# Patient Record
Sex: Female | Born: 1940 | Race: Black or African American | Hispanic: No | Marital: Married | Smoking: Never smoker
Health system: Southern US, Community
[De-identification: ages and names within clinical notes are randomized; demographics above are authoritative.]

## PROBLEM LIST (undated history)

## (undated) DIAGNOSIS — C7951 Secondary malignant neoplasm of bone: Secondary | ICD-10-CM

## (undated) DIAGNOSIS — C541 Malignant neoplasm of endometrium: Secondary | ICD-10-CM

## (undated) DIAGNOSIS — M87052 Idiopathic aseptic necrosis of left femur: Secondary | ICD-10-CM

## (undated) DIAGNOSIS — C50919 Malignant neoplasm of unspecified site of unspecified female breast: Secondary | ICD-10-CM

## (undated) DIAGNOSIS — I10 Essential (primary) hypertension: Secondary | ICD-10-CM

## (undated) DIAGNOSIS — Z923 Personal history of irradiation: Secondary | ICD-10-CM

## (undated) DIAGNOSIS — M87051 Idiopathic aseptic necrosis of right femur: Secondary | ICD-10-CM

## (undated) DIAGNOSIS — E559 Vitamin D deficiency, unspecified: Secondary | ICD-10-CM

## (undated) DIAGNOSIS — Z809 Family history of malignant neoplasm, unspecified: Secondary | ICD-10-CM

## (undated) DIAGNOSIS — E119 Type 2 diabetes mellitus without complications: Secondary | ICD-10-CM

## (undated) DIAGNOSIS — F32A Depression, unspecified: Secondary | ICD-10-CM

## (undated) DIAGNOSIS — F329 Major depressive disorder, single episode, unspecified: Secondary | ICD-10-CM

## (undated) DIAGNOSIS — D509 Iron deficiency anemia, unspecified: Secondary | ICD-10-CM

## (undated) DIAGNOSIS — E785 Hyperlipidemia, unspecified: Secondary | ICD-10-CM

## (undated) HISTORY — DX: Essential (primary) hypertension: I10

## (undated) HISTORY — DX: Major depressive disorder, single episode, unspecified: F32.9

## (undated) HISTORY — DX: Personal history of irradiation: Z92.3

## (undated) HISTORY — DX: Family history of malignant neoplasm, unspecified: Z80.9

## (undated) HISTORY — DX: Vitamin D deficiency, unspecified: E55.9

## (undated) HISTORY — DX: Hyperlipidemia, unspecified: E78.5

## (undated) HISTORY — DX: Iron deficiency anemia, unspecified: D50.9

## (undated) HISTORY — DX: Idiopathic aseptic necrosis of left femur: M87.052

## (undated) HISTORY — DX: Idiopathic aseptic necrosis of right femur: M87.051

## (undated) HISTORY — DX: Depression, unspecified: F32.A

---

## 1971-11-24 HISTORY — PX: OTHER SURGICAL HISTORY: SHX169

## 1998-02-11 ENCOUNTER — Ambulatory Visit (HOSPITAL_COMMUNITY): Admission: RE | Admit: 1998-02-11 | Discharge: 1998-02-11 | Payer: Self-pay | Admitting: *Deleted

## 1999-02-03 ENCOUNTER — Other Ambulatory Visit: Admission: RE | Admit: 1999-02-03 | Discharge: 1999-02-03 | Payer: Self-pay | Admitting: *Deleted

## 1999-05-20 ENCOUNTER — Ambulatory Visit (HOSPITAL_COMMUNITY): Admission: RE | Admit: 1999-05-20 | Discharge: 1999-05-20 | Payer: Self-pay | Admitting: *Deleted

## 2000-05-19 ENCOUNTER — Other Ambulatory Visit: Admission: RE | Admit: 2000-05-19 | Discharge: 2000-05-19 | Payer: Self-pay | Admitting: *Deleted

## 2001-06-20 ENCOUNTER — Other Ambulatory Visit: Admission: RE | Admit: 2001-06-20 | Discharge: 2001-07-07 | Payer: Self-pay

## 2002-01-26 ENCOUNTER — Other Ambulatory Visit: Admission: RE | Admit: 2002-01-26 | Discharge: 2002-01-26 | Payer: Self-pay

## 2002-02-03 ENCOUNTER — Encounter (INDEPENDENT_AMBULATORY_CARE_PROVIDER_SITE_OTHER): Payer: Self-pay | Admitting: *Deleted

## 2002-02-03 ENCOUNTER — Ambulatory Visit (HOSPITAL_COMMUNITY): Admission: RE | Admit: 2002-02-03 | Discharge: 2002-02-03 | Payer: Self-pay | Admitting: Obstetrics & Gynecology

## 2002-02-22 ENCOUNTER — Other Ambulatory Visit: Admission: RE | Admit: 2002-02-22 | Discharge: 2002-02-22 | Payer: Self-pay | Admitting: Otolaryngology

## 2002-08-09 ENCOUNTER — Ambulatory Visit (HOSPITAL_COMMUNITY): Admission: RE | Admit: 2002-08-09 | Discharge: 2002-08-09 | Payer: Self-pay | Admitting: Internal Medicine

## 2002-08-09 ENCOUNTER — Encounter: Payer: Self-pay | Admitting: Internal Medicine

## 2002-09-29 ENCOUNTER — Ambulatory Visit (HOSPITAL_COMMUNITY): Admission: RE | Admit: 2002-09-29 | Discharge: 2002-09-29 | Payer: Self-pay | Admitting: Gastroenterology

## 2003-08-06 ENCOUNTER — Other Ambulatory Visit: Admission: RE | Admit: 2003-08-06 | Discharge: 2003-08-06 | Payer: Self-pay | Admitting: Internal Medicine

## 2004-08-12 ENCOUNTER — Ambulatory Visit (HOSPITAL_COMMUNITY): Admission: RE | Admit: 2004-08-12 | Discharge: 2004-08-12 | Payer: Self-pay | Admitting: Internal Medicine

## 2004-11-23 DIAGNOSIS — C50919 Malignant neoplasm of unspecified site of unspecified female breast: Secondary | ICD-10-CM

## 2004-11-23 HISTORY — DX: Malignant neoplasm of unspecified site of unspecified female breast: C50.919

## 2004-11-23 HISTORY — PX: BREAST LUMPECTOMY: SHX2

## 2004-11-23 HISTORY — PX: BREAST SURGERY: SHX581

## 2005-08-11 ENCOUNTER — Other Ambulatory Visit: Admission: RE | Admit: 2005-08-11 | Discharge: 2005-08-11 | Payer: Self-pay | Admitting: Internal Medicine

## 2005-08-13 ENCOUNTER — Ambulatory Visit (HOSPITAL_COMMUNITY): Admission: RE | Admit: 2005-08-13 | Discharge: 2005-08-13 | Payer: Self-pay | Admitting: Internal Medicine

## 2005-08-25 ENCOUNTER — Encounter: Admission: RE | Admit: 2005-08-25 | Discharge: 2005-08-25 | Payer: Self-pay | Admitting: Internal Medicine

## 2005-08-25 ENCOUNTER — Encounter (INDEPENDENT_AMBULATORY_CARE_PROVIDER_SITE_OTHER): Payer: Self-pay | Admitting: *Deleted

## 2005-08-25 ENCOUNTER — Encounter (INDEPENDENT_AMBULATORY_CARE_PROVIDER_SITE_OTHER): Payer: Self-pay | Admitting: Diagnostic Radiology

## 2005-09-03 ENCOUNTER — Encounter: Admission: RE | Admit: 2005-09-03 | Discharge: 2005-09-03 | Payer: Self-pay | Admitting: Surgery

## 2005-09-09 ENCOUNTER — Encounter: Admission: RE | Admit: 2005-09-09 | Discharge: 2005-09-09 | Payer: Self-pay | Admitting: Surgery

## 2005-09-14 ENCOUNTER — Encounter: Admission: RE | Admit: 2005-09-14 | Discharge: 2005-09-14 | Payer: Self-pay | Admitting: Surgery

## 2005-09-14 ENCOUNTER — Ambulatory Visit (HOSPITAL_COMMUNITY): Admission: RE | Admit: 2005-09-14 | Discharge: 2005-09-14 | Payer: Self-pay | Admitting: Surgery

## 2005-09-14 ENCOUNTER — Encounter (INDEPENDENT_AMBULATORY_CARE_PROVIDER_SITE_OTHER): Payer: Self-pay | Admitting: *Deleted

## 2005-09-14 ENCOUNTER — Ambulatory Visit (HOSPITAL_BASED_OUTPATIENT_CLINIC_OR_DEPARTMENT_OTHER): Admission: RE | Admit: 2005-09-14 | Discharge: 2005-09-14 | Payer: Self-pay | Admitting: Surgery

## 2005-10-01 ENCOUNTER — Ambulatory Visit: Payer: Self-pay | Admitting: Oncology

## 2005-10-21 ENCOUNTER — Ambulatory Visit (HOSPITAL_COMMUNITY): Admission: RE | Admit: 2005-10-21 | Discharge: 2005-10-21 | Payer: Self-pay | Admitting: Oncology

## 2005-11-30 ENCOUNTER — Ambulatory Visit: Admission: RE | Admit: 2005-11-30 | Discharge: 2006-02-03 | Payer: Self-pay | Admitting: Radiation Oncology

## 2006-01-05 ENCOUNTER — Ambulatory Visit: Payer: Self-pay | Admitting: Oncology

## 2006-03-03 ENCOUNTER — Ambulatory Visit: Payer: Self-pay | Admitting: Oncology

## 2006-03-10 ENCOUNTER — Encounter: Admission: RE | Admit: 2006-03-10 | Discharge: 2006-03-10 | Payer: Self-pay | Admitting: Oncology

## 2006-08-16 ENCOUNTER — Ambulatory Visit: Payer: Self-pay | Admitting: Oncology

## 2006-08-16 ENCOUNTER — Ambulatory Visit (HOSPITAL_COMMUNITY): Admission: RE | Admit: 2006-08-16 | Discharge: 2006-08-16 | Payer: Self-pay | Admitting: Internal Medicine

## 2006-08-18 LAB — CBC WITH DIFFERENTIAL/PLATELET
BASO%: 0.5 % (ref 0.0–2.0)
EOS%: 1.7 % (ref 0.0–7.0)
Eosinophils Absolute: 0.1 10*3/uL (ref 0.0–0.5)
MCH: 27.4 pg (ref 26.0–34.0)
MCHC: 34.1 g/dL (ref 32.0–36.0)
MCV: 80.3 fL — ABNORMAL LOW (ref 81.0–101.0)
MONO%: 10.7 % (ref 0.0–13.0)
NEUT#: 2.4 10*3/uL (ref 1.5–6.5)
RBC: 5.03 10*6/uL (ref 3.70–5.32)
RDW: 13.6 % (ref 11.3–14.5)

## 2006-08-18 LAB — COMPREHENSIVE METABOLIC PANEL
ALT: 24 U/L (ref 0–40)
AST: 29 U/L (ref 0–37)
CO2: 28 mEq/L (ref 19–32)
Chloride: 101 mEq/L (ref 96–112)
Sodium: 138 mEq/L (ref 135–145)
Total Bilirubin: 0.4 mg/dL (ref 0.3–1.2)
Total Protein: 7.2 g/dL (ref 6.0–8.3)

## 2006-08-18 LAB — LACTATE DEHYDROGENASE: LDH: 119 U/L (ref 94–250)

## 2006-08-18 LAB — CANCER ANTIGEN 27.29: CA 27.29: 15 U/mL (ref 0–39)

## 2006-09-16 ENCOUNTER — Encounter: Admission: RE | Admit: 2006-09-16 | Discharge: 2006-09-16 | Payer: Self-pay | Admitting: Internal Medicine

## 2007-02-21 ENCOUNTER — Ambulatory Visit: Payer: Self-pay | Admitting: Oncology

## 2007-02-23 LAB — CBC WITH DIFFERENTIAL/PLATELET
BASO%: 0.9 % (ref 0.0–2.0)
EOS%: 3.4 % (ref 0.0–7.0)
HCT: 38.8 % (ref 34.8–46.6)
LYMPH%: 32.8 % (ref 14.0–48.0)
MCH: 27.9 pg (ref 26.0–34.0)
MCHC: 34.9 g/dL (ref 32.0–36.0)
MCV: 80 fL — ABNORMAL LOW (ref 81.0–101.0)
MONO%: 8.6 % (ref 0.0–13.0)
NEUT%: 54.3 % (ref 39.6–76.8)
lymph#: 1.4 10*3/uL (ref 0.9–3.3)

## 2007-02-23 LAB — COMPREHENSIVE METABOLIC PANEL
ALT: 23 U/L (ref 0–35)
AST: 23 U/L (ref 0–37)
Alkaline Phosphatase: 64 U/L (ref 39–117)
BUN: 12 mg/dL (ref 6–23)
Chloride: 98 mEq/L (ref 96–112)
Creatinine, Ser: 0.88 mg/dL (ref 0.40–1.20)
Total Bilirubin: 0.3 mg/dL (ref 0.3–1.2)

## 2007-08-31 ENCOUNTER — Ambulatory Visit: Payer: Self-pay | Admitting: Oncology

## 2007-09-02 LAB — COMPREHENSIVE METABOLIC PANEL
ALT: 31 U/L (ref 0–35)
Alkaline Phosphatase: 54 U/L (ref 39–117)
Sodium: 139 mEq/L (ref 135–145)
Total Bilirubin: 0.4 mg/dL (ref 0.3–1.2)
Total Protein: 7.1 g/dL (ref 6.0–8.3)

## 2007-09-02 LAB — CBC WITH DIFFERENTIAL/PLATELET
EOS%: 1.5 % (ref 0.0–7.0)
MCH: 28.3 pg (ref 26.0–34.0)
MCHC: 34.3 g/dL (ref 32.0–36.0)
MCV: 82.4 fL (ref 81.0–101.0)
MONO%: 8.3 % (ref 0.0–13.0)
RBC: 4.57 10*6/uL (ref 3.70–5.32)
RDW: 10.9 % — ABNORMAL LOW (ref 11.3–14.5)

## 2007-09-02 LAB — CANCER ANTIGEN 27.29: CA 27.29: 11 U/mL (ref 0–39)

## 2007-09-14 ENCOUNTER — Encounter (INDEPENDENT_AMBULATORY_CARE_PROVIDER_SITE_OTHER): Payer: Self-pay | Admitting: Interventional Radiology

## 2007-09-14 ENCOUNTER — Encounter: Admission: RE | Admit: 2007-09-14 | Discharge: 2007-09-14 | Payer: Self-pay | Admitting: Endocrinology

## 2007-09-14 ENCOUNTER — Other Ambulatory Visit: Admission: RE | Admit: 2007-09-14 | Discharge: 2007-09-14 | Payer: Self-pay | Admitting: Interventional Radiology

## 2007-09-19 ENCOUNTER — Encounter: Admission: RE | Admit: 2007-09-19 | Discharge: 2007-09-19 | Payer: Self-pay | Admitting: Internal Medicine

## 2008-01-02 ENCOUNTER — Ambulatory Visit: Payer: Self-pay | Admitting: Oncology

## 2008-01-04 LAB — CBC WITH DIFFERENTIAL/PLATELET
BASO%: 0.2 % (ref 0.0–2.0)
Basophils Absolute: 0 10*3/uL (ref 0.0–0.1)
EOS%: 1.4 % (ref 0.0–7.0)
HGB: 13.7 g/dL (ref 11.6–15.9)
MCH: 26.5 pg (ref 26.0–34.0)
MCHC: 33 g/dL (ref 32.0–36.0)
RBC: 5.16 10*6/uL (ref 3.70–5.32)
RDW: 13.9 % (ref 11.3–14.5)
lymph#: 1.6 10*3/uL (ref 0.9–3.3)

## 2008-01-05 LAB — CANCER ANTIGEN 27.29: CA 27.29: 19 U/mL (ref 0–39)

## 2008-01-05 LAB — VITAMIN D 25 HYDROXY (VIT D DEFICIENCY, FRACTURES): Vit D, 25-Hydroxy: 46 ng/mL (ref 30–89)

## 2008-01-05 LAB — COMPREHENSIVE METABOLIC PANEL
ALT: 18 U/L (ref 0–35)
AST: 24 U/L (ref 0–37)
Albumin: 4.4 g/dL (ref 3.5–5.2)
Calcium: 9.8 mg/dL (ref 8.4–10.5)
Chloride: 100 mEq/L (ref 96–112)
Potassium: 3.4 mEq/L — ABNORMAL LOW (ref 3.5–5.3)
Sodium: 139 mEq/L (ref 135–145)
Total Protein: 7.3 g/dL (ref 6.0–8.3)

## 2008-03-12 ENCOUNTER — Encounter: Admission: RE | Admit: 2008-03-12 | Discharge: 2008-03-12 | Payer: Self-pay | Admitting: Oncology

## 2008-03-26 ENCOUNTER — Ambulatory Visit: Payer: Self-pay | Admitting: Oncology

## 2008-08-24 ENCOUNTER — Ambulatory Visit: Payer: Self-pay | Admitting: Oncology

## 2008-08-28 LAB — CBC WITH DIFFERENTIAL/PLATELET
Basophils Absolute: 0 10*3/uL (ref 0.0–0.1)
Eosinophils Absolute: 0.1 10*3/uL (ref 0.0–0.5)
HCT: 39.7 % (ref 34.8–46.6)
HGB: 13.6 g/dL (ref 11.6–15.9)
MCH: 27.2 pg (ref 26.0–34.0)
NEUT#: 2.7 10*3/uL (ref 1.5–6.5)
NEUT%: 61.3 % (ref 39.6–76.8)
RDW: 13.6 % (ref 11.3–14.5)
lymph#: 1.2 10*3/uL (ref 0.9–3.3)

## 2008-08-29 LAB — COMPREHENSIVE METABOLIC PANEL
AST: 19 U/L (ref 0–37)
Albumin: 4.3 g/dL (ref 3.5–5.2)
BUN: 12 mg/dL (ref 6–23)
CO2: 30 mEq/L (ref 19–32)
Calcium: 10.3 mg/dL (ref 8.4–10.5)
Chloride: 99 mEq/L (ref 96–112)
Creatinine, Ser: 0.73 mg/dL (ref 0.40–1.20)
Glucose, Bld: 120 mg/dL — ABNORMAL HIGH (ref 70–99)
Potassium: 3.5 mEq/L (ref 3.5–5.3)

## 2008-08-29 LAB — LACTATE DEHYDROGENASE: LDH: 122 U/L (ref 94–250)

## 2008-08-29 LAB — CANCER ANTIGEN 27.29: CA 27.29: 21 U/mL (ref 0–39)

## 2008-08-29 LAB — VITAMIN D 25 HYDROXY (VIT D DEFICIENCY, FRACTURES): Vit D, 25-Hydroxy: 46 ng/mL (ref 30–89)

## 2008-09-24 ENCOUNTER — Encounter: Admission: RE | Admit: 2008-09-24 | Discharge: 2008-09-24 | Payer: Self-pay | Admitting: Internal Medicine

## 2008-10-01 ENCOUNTER — Other Ambulatory Visit: Admission: RE | Admit: 2008-10-01 | Discharge: 2008-10-01 | Payer: Self-pay | Admitting: Internal Medicine

## 2009-02-21 ENCOUNTER — Ambulatory Visit: Payer: Self-pay | Admitting: Oncology

## 2009-02-26 LAB — CBC WITH DIFFERENTIAL/PLATELET
Basophils Absolute: 0 10*3/uL (ref 0.0–0.1)
Eosinophils Absolute: 0.1 10*3/uL (ref 0.0–0.5)
HCT: 36.9 % (ref 34.8–46.6)
HGB: 12.7 g/dL (ref 11.6–15.9)
MCH: 26.8 pg (ref 25.1–34.0)
MONO#: 0.3 10*3/uL (ref 0.1–0.9)
NEUT#: 2 10*3/uL (ref 1.5–6.5)
NEUT%: 47.4 % (ref 38.4–76.8)
WBC: 4.1 10*3/uL (ref 3.9–10.3)
lymph#: 1.8 10*3/uL (ref 0.9–3.3)

## 2009-02-27 LAB — COMPREHENSIVE METABOLIC PANEL
AST: 18 U/L (ref 0–37)
Albumin: 4 g/dL (ref 3.5–5.2)
Alkaline Phosphatase: 51 U/L (ref 39–117)
BUN: 11 mg/dL (ref 6–23)
Glucose, Bld: 133 mg/dL — ABNORMAL HIGH (ref 70–99)
Potassium: 3.7 mEq/L (ref 3.5–5.3)
Sodium: 140 mEq/L (ref 135–145)
Total Bilirubin: 0.4 mg/dL (ref 0.3–1.2)
Total Protein: 6.8 g/dL (ref 6.0–8.3)

## 2009-02-27 LAB — VITAMIN D 25 HYDROXY (VIT D DEFICIENCY, FRACTURES): Vit D, 25-Hydroxy: 52 ng/mL (ref 30–89)

## 2009-02-27 LAB — CANCER ANTIGEN 27.29: CA 27.29: 19 U/mL (ref 0–39)

## 2009-09-25 ENCOUNTER — Encounter: Admission: RE | Admit: 2009-09-25 | Discharge: 2009-09-25 | Payer: Self-pay | Admitting: Oncology

## 2010-03-20 ENCOUNTER — Ambulatory Visit: Payer: Self-pay | Admitting: Oncology

## 2010-03-21 LAB — COMPREHENSIVE METABOLIC PANEL
ALT: 23 U/L (ref 0–35)
AST: 25 U/L (ref 0–37)
Albumin: 3.9 g/dL (ref 3.5–5.2)
Alkaline Phosphatase: 54 U/L (ref 39–117)
BUN: 11 mg/dL (ref 6–23)
CO2: 30 mEq/L (ref 19–32)
Calcium: 9.6 mg/dL (ref 8.4–10.5)
Chloride: 99 mEq/L (ref 96–112)
Creatinine, Ser: 0.83 mg/dL (ref 0.40–1.20)
Glucose, Bld: 116 mg/dL — ABNORMAL HIGH (ref 70–99)
Potassium: 3.4 mEq/L — ABNORMAL LOW (ref 3.5–5.3)
Sodium: 137 mEq/L (ref 135–145)
Total Bilirubin: 0.6 mg/dL (ref 0.3–1.2)
Total Protein: 7 g/dL (ref 6.0–8.3)

## 2010-03-21 LAB — CBC WITH DIFFERENTIAL/PLATELET
BASO%: 0.4 % (ref 0.0–2.0)
Basophils Absolute: 0 10*3/uL (ref 0.0–0.1)
EOS%: 1.2 % (ref 0.0–7.0)
Eosinophils Absolute: 0.1 10*3/uL (ref 0.0–0.5)
HCT: 39.8 % (ref 34.8–46.6)
HGB: 13.3 g/dL (ref 11.6–15.9)
LYMPH%: 34 % (ref 14.0–49.7)
MCH: 26.9 pg (ref 25.1–34.0)
MCHC: 33.4 g/dL (ref 31.5–36.0)
MCV: 80.6 fL (ref 79.5–101.0)
MONO#: 0.4 10*3/uL (ref 0.1–0.9)
MONO%: 8.4 % (ref 0.0–14.0)
NEUT#: 2.7 10*3/uL (ref 1.5–6.5)
NEUT%: 56 % (ref 38.4–76.8)
Platelets: 273 10*3/uL (ref 145–400)
RBC: 4.94 10*6/uL (ref 3.70–5.45)
RDW: 13.6 % (ref 11.2–14.5)
WBC: 4.9 10*3/uL (ref 3.9–10.3)
lymph#: 1.7 10*3/uL (ref 0.9–3.3)

## 2010-03-21 LAB — CANCER ANTIGEN 27.29: CA 27.29: 16 U/mL (ref 0–39)

## 2010-03-21 LAB — VITAMIN D 25 HYDROXY (VIT D DEFICIENCY, FRACTURES): Vit D, 25-Hydroxy: 46 ng/mL (ref 30–89)

## 2010-03-21 LAB — LACTATE DEHYDROGENASE: LDH: 107 U/L (ref 94–250)

## 2010-10-02 ENCOUNTER — Encounter: Admission: RE | Admit: 2010-10-02 | Discharge: 2010-10-02 | Payer: Self-pay | Admitting: Oncology

## 2010-10-28 ENCOUNTER — Other Ambulatory Visit
Admission: RE | Admit: 2010-10-28 | Discharge: 2010-10-28 | Payer: Self-pay | Source: Home / Self Care | Admitting: Internal Medicine

## 2011-03-18 ENCOUNTER — Other Ambulatory Visit: Payer: Self-pay | Admitting: Oncology

## 2011-03-18 ENCOUNTER — Encounter (HOSPITAL_BASED_OUTPATIENT_CLINIC_OR_DEPARTMENT_OTHER): Payer: Medicare Other | Admitting: Oncology

## 2011-03-18 DIAGNOSIS — C50419 Malignant neoplasm of upper-outer quadrant of unspecified female breast: Secondary | ICD-10-CM

## 2011-03-18 LAB — CBC WITH DIFFERENTIAL/PLATELET
BASO%: 0.2 % (ref 0.0–2.0)
Eosinophils Absolute: 0 10*3/uL (ref 0.0–0.5)
HGB: 13.6 g/dL (ref 11.6–15.9)
LYMPH%: 40.3 % (ref 14.0–49.7)
MCH: 26.9 pg (ref 25.1–34.0)
MCHC: 33.8 g/dL (ref 31.5–36.0)
MONO#: 0.3 10*3/uL (ref 0.1–0.9)
NEUT#: 2.2 10*3/uL (ref 1.5–6.5)
NEUT%: 52.4 % (ref 38.4–76.8)
RBC: 5.05 10*6/uL (ref 3.70–5.45)
RDW: 13.9 % (ref 11.2–14.5)
WBC: 4.2 10*3/uL (ref 3.9–10.3)
lymph#: 1.7 10*3/uL (ref 0.9–3.3)
nRBC: 0 % (ref 0–0)

## 2011-03-19 LAB — COMPREHENSIVE METABOLIC PANEL
ALT: 42 U/L — ABNORMAL HIGH (ref 0–35)
AST: 73 U/L — ABNORMAL HIGH (ref 0–37)
Albumin: 4.2 g/dL (ref 3.5–5.2)
Alkaline Phosphatase: 58 U/L (ref 39–117)
BUN: 14 mg/dL (ref 6–23)
CO2: 27 mEq/L (ref 19–32)
Calcium: 9.8 mg/dL (ref 8.4–10.5)
Chloride: 98 mEq/L (ref 96–112)
Creatinine, Ser: 0.78 mg/dL (ref 0.40–1.20)
Glucose, Bld: 127 mg/dL — ABNORMAL HIGH (ref 70–99)
Potassium: 4.2 mEq/L (ref 3.5–5.3)
Sodium: 139 mEq/L (ref 135–145)

## 2011-03-19 LAB — LACTATE DEHYDROGENASE: LDH: 146 U/L (ref 94–250)

## 2011-03-25 ENCOUNTER — Encounter (HOSPITAL_BASED_OUTPATIENT_CLINIC_OR_DEPARTMENT_OTHER): Payer: Medicare Other | Admitting: Oncology

## 2011-03-25 ENCOUNTER — Other Ambulatory Visit: Payer: Self-pay | Admitting: Oncology

## 2011-03-25 DIAGNOSIS — M810 Age-related osteoporosis without current pathological fracture: Secondary | ICD-10-CM

## 2011-03-25 DIAGNOSIS — Z853 Personal history of malignant neoplasm of breast: Secondary | ICD-10-CM

## 2011-03-25 DIAGNOSIS — C50419 Malignant neoplasm of upper-outer quadrant of unspecified female breast: Secondary | ICD-10-CM

## 2011-04-10 NOTE — Op Note (Signed)
[facility]  Patient:    Tammy Hensley, Tammy Hensley Visit Number: [id number] MRN: [id number]          Service Type: DSU Location: [facility] Attending Physician:  Lars Pinks Dictated by:   Caralyn Guile. Arlyce Dice, M.D. Proc. Date: 02/03/02 Admit Date:  02/03/2002                             Operative Report  PREOPERATIVE DIAGNOSES:       1. Postmenopausal bleeding.                               2. Myomata uteri.  POSTOPERATIVE DIAGNOSES:      1. Uterine polyps.                               2. Submucous myoma.  PROCEDURES:                   1. Hysteroscopy.                               2. Hysteroscopic uterine myomectomy.                               3. Hysteroscopic polypectomy.                               4. Dilation and curettage.  SURGEON:                      Richard D. Arlyce Dice, M.D.  ANESTHESIA:                   General endotracheal.  ESTIMATED BLOOD LOSS:         20 cc.  FINDINGS:                     The uterus, on examination under anesthesia, was enlarged with approximately a 10-12 week myoma on the right side of the uterine fundus.  The uterine cavity sounded to 8 cm and was diverted off to the left away from the myoma.  On hysteroscopy, there were several endometrial polyps in the lower uterine segment.  Above this, was a large submucous myoma coming in from the right side.  INDICATIONS:                  This is a 70 year old gravida 4, para 2 who has had multiple episodes of postmenopausal bleeding.  She has had office endometrial aspirations, which were all negative for cancer.  She stopped her hormones, but continues to have intermittent bleeding.  She comes to the operating room to rule out uterine cancer.  DESCRIPTION OF PROCEDURE:     The patient was taken to the operating room and placed in the supine position.  General endotracheal anesthesia was induced. She was placed in the dorsal lithotomy position and the vagina and  perineum were prepped and draped in sterile fashion.  The bladder was catheterized. Evaluation under anesthesia was done and a large uterine myoma was noted.  The anterior lip of the cervix was grasped with a single-tooth tenaculum and the internal  os was dilated with Pratt dilators to #29.  The hysteroscope was introduced into the endometrial cavity and uterine polyps and uterine myoma were identified.  The hysteroscope was removed and the polyps removed with polyp forceps.  The resectoscope was then placed into the endometrial cavity and, using cautery, the fibroid was disrupted and segments of the fibroid removed with the loop resectoscope.  This was done three or four episode over a period of one hour, with removal of a significant amount of myomatous tissue.  However, on replacing the hysteroscope, the uterine myoma continued to fill the endometrial cavity.  It was felt that what was happening was that the large external myoma that was palpated under anesthesia was actually growing into the endometrial cavity, and that the resection was just removing the part of the myoma that was intrauterine and that the myoma was extruding into the endometrial cavity, thus making complete dissection and removal impossible.  At this point, a large segment of myoma had been removed, the polyps had been removed, the D&C had been done to rule out any endometrial cavity, and the endometrium not related to the polyps on the D&C appeared atrophic and otherwise perfectly normal.  Since the primary role of the surgery was to eliminate the possibility that this patient had uterine cance,r and since it seemed that removal of the fibroid hysteroscopically would be impossible, the procedure was terminated at this point.  The patient left the operating room in good condition. Dictated by:   Caralyn Guile Arlyce Dice, M.D. Attending Physician:  Lars Pinks DD:  02/03/02 TD:  02/04/02 Job: [id number]

## 2011-04-10 NOTE — Op Note (Signed)
Tammy Hensley, Tammy Hensley              ACCOUNT NO.:  [id number]   MEDICAL RECORD NO.:  [id number]          PATIENT TYPE:  AMB   LOCATION:  DSC                          FACILITY:  [facility]   PHYSICIAN:  Sandria Bales. Ezzard Standing, M.D.  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  Questional 2 lesions.   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 Questionable two lesions.   PROCEDURE:  Left partial mastectomy (needle localization x 2) (upper out  quadrant of left breast), injection of methylene blue, sentinel lymph node  biopsy of left axilla.   SURGEON:  Sandria Bales. Ezzard Standing, M.D.   ANESTHESIA:  General endotracheal with 20 mL of 0.25% Marcaine.   COMPLICATIONS:  None.   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  Her MRI has suggested a second lesion that is  within about 2-3 cm of the first lesion.  She now comes for excision of both  these areas and she has had a needle localization of both areas (2 wires) in  the upper outer quadrant of her left breast.  Her primary tumor appears to  be at about the 2 o'clock position, the secondary tumor seems to be at the 3  o'clock position.   The indications and potential complications were explained to the patient.  The option of mastectomy and lumpectomy were also explained to the patient.  The potential complications include but are not limited to infection,  bleeding, nerve injury, the possibility of a full axillary node dissection,  and the possibility of recurrent tumor.  The patient now comes for her  operation.   DESCRIPTION OF PROCEDURE:  The patient  underwent a general LMA anesthesia.  Her left breast, axilla, and behind her left arm were prepped with Betadine  solution and sterilely draped.  She had two wires coming out of her left  breast, one about the 2 o'clock position and one about the 3 o'clock  position.  The breast was infiltrated with about 2 mL of methylene blue at  the beginning of the procedure.  She was also injected with a radioactive  sulfa colloid preoperativelyn in the periareolar area.  Using the Neoprobe,  I marked the left axilla  at the area that had the highest counts.   First, I went to the left axilla to find the sentinel lymph node directly  over where she appeared to have the highest counts using the Neoprobe.  I  dissected down and found an approximately 1 cm lymph node.  There was no  blue dye in the lymph node and this was sent to pathology.  The lymph node  was counts of 430 with a background of 25.  Dr. Guerry Bruin said  the  pathology appeared benign on this.  This was felt to be a negative sentinel  lymph node biopsy.   I then used the two wires to bracket an excision from her left breast.  I  tried to get at least 1-2 cm outside of where I imaged the wire and tumor  were.  I excised the block of breast tissue, probably about 12 cm in  diameter, I went down to the chest wall, I marked the specimen, cranial,  medial, and caudad, and the two wires were coming out laterally.  Touch prep  examination by Dr. Guerry Bruin felt the cranial margin was the closest at  about 8 mm.  There was some questionable cells medially but because of the  lobular tumor, he was unsure of what this meant, but she had no gross  disease going to any margin.  Mammography of the specimen confirmed the  lesions had been excised.   I then marked the bed of the biopsy site with small hemoclips.  I  infiltrated the breast and tissue and skin tissue with approximately 20 mL  of 0.25% Marcaine.  I then irrigated the wound and closed  the subcutaneous  tissue with 3-0 Vicryl suture and the skin with a 5-0 Monocryl suture,  painted the wound with tincture of Benzoin and placed Steri-Strips.   The patient tolerated the procedure well and was transported to the recovery  room in good condition.  Sponge and needle counts were correct at the end of  the case.      Sandria Bales. Ezzard Standing, M.D.  Electronically Signed     DHN/MEDQ  D:  09/14/2005  T:  09/14/2005  Job:  [id number]   cc:   Lovenia Kim, D.O.  Fax: [phone]   D. La Joya Lions, M.D.

## 2011-04-30 ENCOUNTER — Encounter (INDEPENDENT_AMBULATORY_CARE_PROVIDER_SITE_OTHER): Payer: Self-pay | Admitting: Surgery

## 2011-05-05 ENCOUNTER — Ambulatory Visit (HOSPITAL_COMMUNITY)
Admission: RE | Admit: 2011-05-05 | Discharge: 2011-05-05 | Disposition: A | Discharge status: Home / Self Care | Payer: Medicare Other | Source: Ambulatory Visit | Attending: Oncology | Admitting: Oncology

## 2011-05-05 DIAGNOSIS — Z853 Personal history of malignant neoplasm of breast: Secondary | ICD-10-CM | POA: Insufficient documentation

## 2011-05-05 DIAGNOSIS — R109 Unspecified abdominal pain: Secondary | ICD-10-CM | POA: Insufficient documentation

## 2011-05-05 DIAGNOSIS — M810 Age-related osteoporosis without current pathological fracture: Secondary | ICD-10-CM | POA: Insufficient documentation

## 2011-06-16 ENCOUNTER — Other Ambulatory Visit: Payer: Self-pay | Admitting: Oncology

## 2011-06-16 ENCOUNTER — Encounter (HOSPITAL_BASED_OUTPATIENT_CLINIC_OR_DEPARTMENT_OTHER): Payer: Medicare Other | Admitting: Oncology

## 2011-06-16 DIAGNOSIS — E039 Hypothyroidism, unspecified: Secondary | ICD-10-CM

## 2011-06-16 DIAGNOSIS — C50419 Malignant neoplasm of upper-outer quadrant of unspecified female breast: Secondary | ICD-10-CM

## 2011-06-16 DIAGNOSIS — M810 Age-related osteoporosis without current pathological fracture: Secondary | ICD-10-CM

## 2011-06-16 DIAGNOSIS — Z9889 Other specified postprocedural states: Secondary | ICD-10-CM

## 2011-06-16 DIAGNOSIS — Z853 Personal history of malignant neoplasm of breast: Secondary | ICD-10-CM

## 2011-06-16 LAB — HEPATIC FUNCTION PANEL
Albumin: 4 g/dL (ref 3.5–5.2)
Alkaline Phosphatase: 73 U/L (ref 39–117)
Total Bilirubin: 0.5 mg/dL (ref 0.3–1.2)

## 2011-09-21 ENCOUNTER — Encounter (INDEPENDENT_AMBULATORY_CARE_PROVIDER_SITE_OTHER): Payer: Self-pay | Admitting: Surgery

## 2011-10-07 ENCOUNTER — Ambulatory Visit
Admission: RE | Admit: 2011-10-07 | Discharge: 2011-10-07 | Disposition: A | Discharge status: Home / Self Care | Payer: Medicare Other | Source: Ambulatory Visit | Attending: Oncology | Admitting: Oncology

## 2011-10-07 DIAGNOSIS — Z853 Personal history of malignant neoplasm of breast: Secondary | ICD-10-CM

## 2011-10-07 DIAGNOSIS — Z9889 Other specified postprocedural states: Secondary | ICD-10-CM

## 2011-10-28 ENCOUNTER — Telehealth: Payer: Self-pay | Admitting: Oncology

## 2011-10-28 NOTE — Telephone Encounter (Signed)
pt called and scheduled her appts for jan2013

## 2011-11-18 ENCOUNTER — Telehealth: Payer: Self-pay | Admitting: *Deleted

## 2011-11-18 NOTE — Telephone Encounter (Signed)
patient confirmed over the phone the new date and time on 12-21-2011 starting at 8:30am

## 2011-12-17 ENCOUNTER — Ambulatory Visit: Payer: Medicare Other | Admitting: Oncology

## 2011-12-17 ENCOUNTER — Other Ambulatory Visit: Payer: Medicare Other | Admitting: Lab

## 2011-12-21 ENCOUNTER — Ambulatory Visit (HOSPITAL_BASED_OUTPATIENT_CLINIC_OR_DEPARTMENT_OTHER): Payer: Medicare Other | Admitting: Physician Assistant

## 2011-12-21 ENCOUNTER — Other Ambulatory Visit: Payer: Medicare Other | Admitting: Lab

## 2011-12-21 ENCOUNTER — Encounter: Payer: Self-pay | Admitting: Physician Assistant

## 2011-12-21 ENCOUNTER — Telehealth: Payer: Self-pay | Admitting: *Deleted

## 2011-12-21 VITALS — BP 142/87 | HR 91 | Temp 98.2°F | Ht 67.0 in | Wt 151.9 lb

## 2011-12-21 DIAGNOSIS — C50419 Malignant neoplasm of upper-outer quadrant of unspecified female breast: Secondary | ICD-10-CM

## 2011-12-21 DIAGNOSIS — M810 Age-related osteoporosis without current pathological fracture: Secondary | ICD-10-CM

## 2011-12-21 DIAGNOSIS — C50912 Malignant neoplasm of unspecified site of left female breast: Secondary | ICD-10-CM

## 2011-12-21 LAB — CBC WITH DIFFERENTIAL/PLATELET
Eosinophils Absolute: 0.1 10*3/uL (ref 0.0–0.5)
MCV: 79.7 fL (ref 79.5–101.0)
MONO%: 7 % (ref 0.0–14.0)
NEUT#: 2.6 10*3/uL (ref 1.5–6.5)
RBC: 5.28 10*6/uL (ref 3.70–5.45)
RDW: 14 % (ref 11.2–14.5)
WBC: 5.1 10*3/uL (ref 3.9–10.3)
lymph#: 2.1 10*3/uL (ref 0.9–3.3)
nRBC: 0 % (ref 0–0)

## 2011-12-21 NOTE — Telephone Encounter (Signed)
gave patient appointment for 11-2012

## 2011-12-21 NOTE — Progress Notes (Signed)
Hematology and Oncology Follow Up Visit  Tammy Hensley [id number] 1941-10-21 71 y.o. 12/21/2011    HPI:    Patient is a 71 year old [place] woman with a history of a multifocal infiltrating lobular carcinoma for which she underwent a left lumpectomy with sentinel node dissection on 09/14/2005. Tumor was ER 92%/PR 86% positive HER-2 negative with a Ki-67 of 6%.  She completed radiation therapy on 01/28/2006. She then completed 5 years worth of tamoxifen 20 mg by mouth daily on 03/25/2011.     Interim History:      Tammy Hensley presents to our office today for routine followup returning to her history of a clinical T2, N0, multifocal infiltrating lobular carcinoma noted to be ER/PR positive HER-2 negative diagnosed in October of 2006. She underwent a lumpectomy with sentinel node dissection followed by radiation therapy which was completed on 01/28/2006, she completed 5 years worth of tamoxifen on 03/25/2011.    She is feeling well denying any specific complaints i.e. no fevers chills night sweats shortness of breath chest pain. No nausea emesis diarrhea or constipation issues. Her overall energy level is excellent. 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 She had her annual mammogram in November of 2012 and all was well. Of note, she also underwent ultrasound of the abdomen in May 2012 which showed fatty infiltrate but no evidence of frank liver disease.  A detailed review of systems is otherwise noncontributory as noted below.  Review of Systems: Constitutional:  no weight loss, fever, night sweats and feels well Eyes: uses glasses ENT:No complaints Cardiovascular: no chest pain or dyspnea on exertion Respiratory: no cough, shortness of breath, or wheezing Neurological: no TIA or stroke symptoms Dermatological: negative Gastrointestinal: no abdominal pain, change in bowel habits, or black or  bloody stools Genito-Urinary: no dysuria, trouble voiding, or hematuria Hematological and Lymphatic: negative Breast: negative for breast lumps Musculoskeletal: negative Remaining ROS negative.    Medications:   I have reviewed the patient's current medications.  Current Outpatient Prescriptions  Medication Sig Dispense Refill  . aspirin 81 MG tablet Take 81 mg by mouth daily.        . Calcium Carbonate-Vitamin D (CALCIUM 600 + D PO) Take by mouth 2 (two) times daily.       Marland Kitchen levothyroxine (SYNTHROID, LEVOTHROID) 88 MCG tablet Take 88 mcg by mouth daily.        Marland Kitchen triamterene-hydrochlorothiazide (MAXZIDE) 75-50 MG per tablet Take 1 tablet by mouth daily.          Allergies: No Known Allergies  Physical Exam: Filed Vitals:   12/21/11 0848  BP: 142/87  Pulse: 91  Temp: 98.2 F (36.8 C)   HEENT:  Sclerae anicteric, conjunctivae pink.  Oropharynx clear.  No mucositis or candidiasis.   Nodes:  No cervical, supraclavicular, or axillary lymphadenopathy palpated.  Breast Exam: The left breast lumpectomy scar is benign without any evidence of dominant mass effect. There is no evidence of nipple inversion or discharge. The axilla is clear. The right breast is free of masses skin changes nipple inversion or discharge axilla is clear.   Lungs:  Clear to auscultation bilaterally.  No crackles, rhonchi, or wheezes.   Heart:  Regular rate and rhythm.   Abdomen:  Soft, nontender.  Positive bowel sounds.  No organomegaly or masses palpated.   Musculoskeletal:  No focal spinal tenderness to palpation.  Extremities:  Benign.  No peripheral  edema or cyanosis.   Skin:  Benign.   Neuro:  Nonfocal, alert and oriented x 3.   Lab Results: Lab Results  Component Value Date   WBC 5.1 12/21/2011   HGB 14.1 12/21/2011   HCT 42.1 12/21/2011   MCV 79.7 12/21/2011   PLT 312 12/21/2011   NEUTROABS 2.6 12/21/2011     Chemistry      Component Value Date/Time   NA 139 03/18/2011 1355   NA 139 03/18/2011  1355   NA 139 03/18/2011 1355   K 4.2 03/18/2011 1355   K 4.2 03/18/2011 1355   K 4.2 03/18/2011 1355   CL 98 03/18/2011 1355   CL 98 03/18/2011 1355   CL 98 03/18/2011 1355   CO2 27 03/18/2011 1355   CO2 27 03/18/2011 1355   CO2 27 03/18/2011 1355   BUN 14 03/18/2011 1355   BUN 14 03/18/2011 1355   BUN 14 03/18/2011 1355   CREATININE 0.78 03/18/2011 1355   CREATININE 0.78 03/18/2011 1355   CREATININE 0.78 03/18/2011 1355      Component Value Date/Time   CALCIUM 9.8 03/18/2011 1355   CALCIUM 9.8 03/18/2011 1355   CALCIUM 9.8 03/18/2011 1355   ALKPHOS 73 06/16/2011 1130   AST 39* 06/16/2011 1130   ALT 32 06/16/2011 1130   BILITOT 0.5 06/16/2011 1130        Radiological Studies: 10/07/11 DIGITAL DIAGNOSTIC BILATERAL MAMMOGRAM WITH CAD  Comparison: 10/02/2010, 09/25/2009, 09/24/2008  Findings: The breast tissue is heterogeneously dense. Left  lumpectomy changes are present. There is no dominant mass,  nonsurgical architectural distortion or calcification to suggest  malignancy.  Mammographic images were processed with CAD.  IMPRESSION:  No mammographic evidence of malignancy. Yearly diagnostic  mammography is.  BI-RADS CATEGORY 2: Benign finding(s).    Assessment:     A 71 year old [place] woman with a history of a multifocal infiltrating lobular carcinoma noted to be ER at 92%, PR at 86% positive HER-2 negative with a Ki-67 of 6%. She underwent a left lumpectomy with sentinel node dissection on 09/14/2005 followed by radiation therapy which was completed on 01/28/2006. She completed 5 years worth of adjuvant hormonal therapy in the form of tamoxifen 20 mg per day on 03/25/2011. She has no evidence to suggest recurrent or metastatic disease.   Case has been reviewed with Dr. Pierce Crane.  Plan:     Tammy Hensley will return to our office in one years time for annual followup. Of note, she [facility].  She knows to be happy to see her prior to her annual followup if the need should arise.   This plan was reviewed with the patient, who voices understanding and agreement.  She knows to call with any changes or problems.    July Linam T, PA-C 12/21/2011

## 2011-12-22 LAB — CANCER ANTIGEN 27.29: CA 27.29: 17 U/mL (ref 0–39)

## 2011-12-22 LAB — COMPREHENSIVE METABOLIC PANEL
ALT: 16 U/L (ref 0–35)
AST: 21 U/L (ref 0–37)
Chloride: 100 mEq/L (ref 96–112)
Creatinine, Ser: 0.83 mg/dL (ref 0.50–1.10)
Total Bilirubin: 0.5 mg/dL (ref 0.3–1.2)

## 2012-10-24 ENCOUNTER — Other Ambulatory Visit: Payer: Self-pay | Admitting: Oncology

## 2012-10-24 DIAGNOSIS — Z853 Personal history of malignant neoplasm of breast: Secondary | ICD-10-CM

## 2012-11-02 ENCOUNTER — Ambulatory Visit
Admission: RE | Admit: 2012-11-02 | Discharge: 2012-11-02 | Disposition: A | Discharge status: Home / Self Care | Payer: Medicare Other | Source: Ambulatory Visit | Attending: Oncology | Admitting: Oncology

## 2012-11-02 DIAGNOSIS — Z853 Personal history of malignant neoplasm of breast: Secondary | ICD-10-CM

## 2012-11-15 ENCOUNTER — Telehealth: Payer: Self-pay | Admitting: *Deleted

## 2012-11-15 NOTE — Telephone Encounter (Signed)
left voice message to inform the patient of the cancelled appointment on 11-25-2012

## 2012-11-25 ENCOUNTER — Other Ambulatory Visit: Payer: Medicare Other | Admitting: Lab

## 2012-11-25 ENCOUNTER — Ambulatory Visit: Payer: Medicare Other | Admitting: Oncology

## 2012-12-05 ENCOUNTER — Other Ambulatory Visit: Payer: Self-pay | Admitting: *Deleted

## 2012-12-05 ENCOUNTER — Telehealth: Payer: Self-pay | Admitting: Oncology

## 2012-12-05 DIAGNOSIS — Z170 Estrogen receptor positive status [ER+]: Secondary | ICD-10-CM | POA: Insufficient documentation

## 2012-12-05 DIAGNOSIS — C50919 Malignant neoplasm of unspecified site of unspecified female breast: Secondary | ICD-10-CM

## 2012-12-05 NOTE — Telephone Encounter (Signed)
I scheduled a follow up for the pt.

## 2012-12-05 NOTE — Telephone Encounter (Signed)
Scheduled pt to come in 12/20/12 to be seen by Bobbe Medico in follow up.

## 2012-12-20 ENCOUNTER — Ambulatory Visit (HOSPITAL_BASED_OUTPATIENT_CLINIC_OR_DEPARTMENT_OTHER): Payer: Medicare Other | Admitting: Nurse Practitioner

## 2012-12-20 ENCOUNTER — Telehealth: Payer: Self-pay | Admitting: Oncology

## 2012-12-20 ENCOUNTER — Other Ambulatory Visit (HOSPITAL_BASED_OUTPATIENT_CLINIC_OR_DEPARTMENT_OTHER): Payer: Medicare Other | Admitting: Lab

## 2012-12-20 VITALS — BP 152/87 | HR 103 | Temp 97.6°F | Resp 20 | Ht 67.0 in | Wt 162.5 lb

## 2012-12-20 DIAGNOSIS — C50919 Malignant neoplasm of unspecified site of unspecified female breast: Secondary | ICD-10-CM

## 2012-12-20 DIAGNOSIS — M949 Disorder of cartilage, unspecified: Secondary | ICD-10-CM

## 2012-12-20 DIAGNOSIS — C50419 Malignant neoplasm of upper-outer quadrant of unspecified female breast: Secondary | ICD-10-CM

## 2012-12-20 DIAGNOSIS — M899 Disorder of bone, unspecified: Secondary | ICD-10-CM

## 2012-12-20 LAB — COMPREHENSIVE METABOLIC PANEL (CC13)
AST: 30 U/L (ref 5–34)
Alkaline Phosphatase: 107 U/L (ref 40–150)
BUN: 11.5 mg/dL (ref 7.0–26.0)
Creatinine: 0.8 mg/dL (ref 0.6–1.1)
Potassium: 3.7 mEq/L (ref 3.5–5.1)

## 2012-12-20 LAB — CBC WITH DIFFERENTIAL/PLATELET
BASO%: 0.9 % (ref 0.0–2.0)
EOS%: 0.8 % (ref 0.0–7.0)
HCT: 43 % (ref 34.8–46.6)
LYMPH%: 37.1 % (ref 14.0–49.7)
MCH: 26.7 pg (ref 25.1–34.0)
MCHC: 33.7 g/dL (ref 31.5–36.0)
NEUT%: 51.8 % (ref 38.4–76.8)
lymph#: 1.7 10*3/uL (ref 0.9–3.3)

## 2012-12-20 NOTE — Telephone Encounter (Signed)
gv pt appt schedule for January 2015. °

## 2012-12-21 ENCOUNTER — Encounter: Payer: Self-pay | Admitting: Nurse Practitioner

## 2012-12-21 NOTE — Progress Notes (Signed)
Hematology and Oncology Follow Up Visit  Tammy Hensley [id number] [birth year] 72 y.o. 12/21/2012    HPI:    Patient is a 72 year old [place] woman with a history of a multifocal infiltrating lobular carcinoma for which she underwent a left lumpectomy with sentinel node dissection on 09/14/2005. Tumor was ER 92%/PR 86% positive HER-2 negative with a Ki-67 of 6%.  She completed radiation therapy on 01/28/2006. She then completed 5 years worth of tamoxifen 20 mg by mouth daily on 03/25/2011.     Interim History:      Tammy Hensley presents to our office today for routine followup returning to her history of a clinical T2, N0, multifocal infiltrating lobular carcinoma noted to be ER/PR positive HER-2 negative diagnosed in October of 2006. She underwent a lumpectomy with sentinel node dissection followed by radiation therapy which was completed on 01/28/2006, she completed 5 years worth of tamoxifen on 03/25/2011.    She is feeling well denying any specific complaints i.e. no fevers chills night sweats shortness of breath chest pain. No nausea emesis diarrhea or constipation issues. Her overall energy level is excellent. She is doing very well overall. She remains quite active and we reviewed her most recent bone density scan today which showed a score -1.0 - consistent with low bone mass. She does take calcium and Vitamin D.   A detailed review of systems is otherwise noncontributory as noted below.  Review of Systems: Constitutional:  no weight loss, fever, night sweats and feels well Eyes: uses glasses ENT: she reports occasional sinus drainge Cardiovascular: no chest pain or dyspnea on exertion Respiratory: no cough, shortness of breath, or wheezing Neurological: no TIA or stroke symptoms Dermatological: negative Gastrointestinal: no abdominal pain, change in bowel habits, or black or bloody stools Genito-Urinary: no dysuria, trouble voiding, or hematuria Hematological and  Lymphatic: negative Breast: negative for breast lumps Musculoskeletal: negative Remaining ROS negative.    Medications:   I have reviewed the patient's current medications.  Current Outpatient Prescriptions  Medication Sig Dispense Refill  . aspirin 81 MG tablet Take 81 mg by mouth daily.        . Calcium Carbonate-Vitamin D (CALCIUM 600 + D PO) Take by mouth 2 (two) times daily.       Marland Kitchen levothyroxine (SYNTHROID, LEVOTHROID) 88 MCG tablet Take 88 mcg by mouth daily.        Marland Kitchen triamterene-hydrochlorothiazide (MAXZIDE) 75-50 MG per tablet Take 1 tablet by mouth daily.          Allergies: No Known Allergies  Physical Exam: Filed Vitals:   12/20/12 1348  BP: 152/87  Pulse: 103  Temp: 97.6 F (36.4 C)  Resp: 20   HEENT:  Sclerae anicteric, conjunctivae pink.  Oropharynx clear.  No mucositis or candidiasis.   Nodes:  No cervical, supraclavicular, or axillary lymphadenopathy palpated.  Breast Exam: The left breast lumpectomy scar is benign without any evidence of dominant mass effect. There is no evidence of nipple inversion or discharge. The axilla is clear. The right breast is free of masses skin changes nipple inversion or discharge axilla is clear.   Lungs:  Clear to auscultation bilaterally.  No crackles, rhonchi, or wheezes.   Heart:  Regular rate and rhythm.   Abdomen:  Soft, nontender.  Positive bowel sounds.  No organomegaly or masses palpated.   Musculoskeletal:  No focal spinal tenderness to palpation.  Extremities:  Benign.  No peripheral edema or cyanosis.   Skin:  Benign.   Neuro:  Nonfocal, alert and oriented x 3.   Lab Results: Lab Results  Component Value Date   WBC 4.6 12/20/2012   HGB 14.5 12/20/2012   HCT 43.0 12/20/2012   MCV 79.3* 12/20/2012   PLT 278 12/20/2012   NEUTROABS 2.4 12/20/2012     Chemistry      Component Value Date/Time   NA 138 12/20/2012 1336   NA 139 12/21/2011 0835   K 3.7 12/20/2012 1336   K 3.5 12/21/2011 0835   CL 99 12/20/2012 1336    CL 100 12/21/2011 0835   CO2 30* 12/20/2012 1336   CO2 30 12/21/2011 0835   BUN 11.5 12/20/2012 1336   BUN 10 12/21/2011 0835   CREATININE 0.8 12/20/2012 1336   CREATININE 0.83 12/21/2011 0835      Component Value Date/Time   CALCIUM 10.1 12/20/2012 1336   CALCIUM 9.7 12/21/2011 0835   ALKPHOS 107 12/20/2012 1336   ALKPHOS 89 12/21/2011 0835   AST 30 12/20/2012 1336   AST 21 12/21/2011 0835   ALT 25 12/20/2012 1336   ALT 16 12/21/2011 0835   BILITOT 0.64 12/20/2012 1336   BILITOT 0.5 12/21/2011 0835        Radiological Studies: 11/02/2012 ** RADIOLOGY REPORT **  Clinical Data: 72 year old female for annual bilateral mammograms  - history of left breast cancer and lumpectomy in 2006.  DIGITAL DIAGNOSTIC BILATERAL MAMMOGRAM with CAD  Comparison: 10/07/2011 and prior mammograms dating back to  09/24/2008.  Findings:  Breast density: 3: The breast tissue is heterogeneously dense.  MLO and CC views of both breasts and a spot tangential view of the  lumpectomy site performed.  Scarring in the upper outer left breast again identified.  There is no evidence of mass, nonsurgical distortion, or suspicious  calcifications.  Mammographic images were processed with CAD.  IMPRESSION:  No specific mammographic evidence of malignancy.  Left breast scarring.  These findings were discussed with the patient. She was encouraged  to begin/continue monthly self exams and to contact her primary  physician if any changes noted.  BI-RADS CATEGORY 2: Benign finding(s).  Recommend bilateral screening mammograms in 1 year.  Original Report Authenticated By: Harmon Pier, M.D.     Assessment:     A 73year-old [facility] woman with a history of a multifocal infiltrating lobular carcinoma noted to be ER at 92%, PR at 86% positive HER-2 negative with a Ki-67 of 6%. She underwent a left lumpectomy with sentinel node dissection on 09/14/2005 followed by radiation therapy which was completed on  01/28/2006. She completed 5 years worth of adjuvant hormonal therapy in the form of tamoxifen 20 mg per day on 03/25/2011. She has no evidence to suggest recurrent or metastatic disease.    Plan:     Tammy Hensley will return to our office in one years time for annual followup. Her last mammogram was 11/02/2012 and was unremarkable. She will be due for repeat annual mammogram Dec 2014. .  She knows we will be happy to see her prior to her annual followup if the need should arise. She had opportunity to meet with Dr. Darnelle Catalan and will follow up with him next year.   This plan was reviewed with the patient, who voices understanding and agreement.  She knows to call with any changes or problems.    [facility], NP-C, AOCNP 12/21/2012

## 2013-10-02 ENCOUNTER — Other Ambulatory Visit: Payer: Self-pay

## 2013-10-02 DIAGNOSIS — Z853 Personal history of malignant neoplasm of breast: Secondary | ICD-10-CM

## 2013-10-02 DIAGNOSIS — Z1231 Encounter for screening mammogram for malignant neoplasm of breast: Secondary | ICD-10-CM

## 2013-10-03 ENCOUNTER — Encounter: Payer: Self-pay | Admitting: Internal Medicine

## 2013-10-04 ENCOUNTER — Ambulatory Visit: Payer: Medicare Other | Admitting: Physician Assistant

## 2013-10-04 ENCOUNTER — Encounter: Payer: Self-pay | Admitting: Physician Assistant

## 2013-10-04 VITALS — BP 128/78 | HR 76 | Temp 98.5°F | Resp 16 | Wt 161.0 lb

## 2013-10-04 DIAGNOSIS — E559 Vitamin D deficiency, unspecified: Secondary | ICD-10-CM | POA: Insufficient documentation

## 2013-10-04 DIAGNOSIS — E039 Hypothyroidism, unspecified: Secondary | ICD-10-CM | POA: Insufficient documentation

## 2013-10-04 DIAGNOSIS — I10 Essential (primary) hypertension: Secondary | ICD-10-CM | POA: Insufficient documentation

## 2013-10-04 DIAGNOSIS — D509 Iron deficiency anemia, unspecified: Secondary | ICD-10-CM | POA: Insufficient documentation

## 2013-10-04 DIAGNOSIS — E119 Type 2 diabetes mellitus without complications: Secondary | ICD-10-CM

## 2013-10-04 DIAGNOSIS — E785 Hyperlipidemia, unspecified: Secondary | ICD-10-CM

## 2013-10-04 LAB — HEPATIC FUNCTION PANEL
ALT: 23 U/L (ref 0–35)
Alkaline Phosphatase: 83 U/L (ref 39–117)
Bilirubin, Direct: 0.1 mg/dL (ref 0.0–0.3)
Indirect Bilirubin: 0.5 mg/dL (ref 0.0–0.9)
Total Bilirubin: 0.6 mg/dL (ref 0.3–1.2)
Total Protein: 7.4 g/dL (ref 6.0–8.3)

## 2013-10-04 LAB — CBC WITH DIFFERENTIAL/PLATELET
Basophils Relative: 1 % (ref 0–1)
Eosinophils Absolute: 0.1 10*3/uL (ref 0.0–0.7)
Eosinophils Relative: 2 % (ref 0–5)
Hemoglobin: 14.8 g/dL (ref 12.0–15.0)
Lymphocytes Relative: 41 % (ref 12–46)
Lymphs Abs: 2.2 10*3/uL (ref 0.7–4.0)
MCH: 26.2 pg (ref 26.0–34.0)
MCHC: 34.3 g/dL (ref 30.0–36.0)
MCV: 76.3 fL — ABNORMAL LOW (ref 78.0–100.0)
Monocytes Absolute: 0.4 10*3/uL (ref 0.1–1.0)
Monocytes Relative: 8 % (ref 3–12)
Neutrophils Relative %: 48 % (ref 43–77)
Platelets: 346 10*3/uL (ref 150–400)

## 2013-10-04 LAB — BASIC METABOLIC PANEL WITH GFR
BUN: 8 mg/dL (ref 6–23)
CO2: 31 mEq/L (ref 19–32)
Calcium: 10 mg/dL (ref 8.4–10.5)
Chloride: 100 mEq/L (ref 96–112)
Creat: 0.71 mg/dL (ref 0.50–1.10)
GFR, Est African American: >89 mL/min
Glucose, Bld: 95 mg/dL (ref 70–99)
Potassium: 3.7 mEq/L (ref 3.5–5.3)
Sodium: 139 mEq/L (ref 135–145)

## 2013-10-04 LAB — LIPID PANEL
Cholesterol: 180 mg/dL (ref 0–200)
HDL: 47 mg/dL (ref >39)
LDL Cholesterol: 110 mg/dL — ABNORMAL HIGH (ref 0–99)
VLDL: 23 mg/dL (ref 0–40)

## 2013-10-04 LAB — TSH: TSH: 0.553 u[IU]/mL (ref 0.350–4.500)

## 2013-10-04 NOTE — Progress Notes (Signed)
HPI Patient presents for 3 month follow up with hypertension, hyperlipidemia, prediabetes and vitamin D.  Patient's blood pressure has been controlled at home. Patient denies chest pain, shortness of breath, dizziness.  Patient's cholesterol is diet controlled and is on Pravastatin however her cholesterol was not controlled last visit so she was suppose to start Atorvastatin 80 (1/2) QOD and denies myalgias. However patient did not get this filled, and she wants it rechecked. The cholesterol last visit was 145 . she has  been working on diet and exercise for diabetes, denies changes in vision, polys, and paresthesias. A1C was 6.5. Patient is on Vitamin D supplement.  Increasing stress, has had two friends died from cancer and primary care giver for sister.  Current Medications:    Medication List       This list is accurate as of: 10/04/13  1:56 PM.  Always use your most recent med list.               aspirin 81 MG tablet  Take 81 mg by mouth daily.     CALCIUM 600 + D PO  Take by mouth 2 (two) times daily.     levothyroxine 88 MCG tablet  Commonly known as:  SYNTHROID, LEVOTHROID  Take 88 mcg by mouth daily. 1/2 daily     MULTIVITAMIN PO  Take by mouth.     pravastatin 40 MG tablet  Commonly known as:  PRAVACHOL  Take 40 mg by mouth daily.     triamterene-hydrochlorothiazide 75-50 MG per tablet  Commonly known as:  MAXZIDE  Take 1 tablet by mouth daily.     VITAMIN D (ERGOCALCIFEROL) PO  Take 4,000 Int'l Units by mouth daily.       Allergies:  Allergies  Allergen Reactions  . Calan [Verapamil]     Constipation   . Effexor [Venlafaxine]   . Erythromycin   . Femara [Letrozole]   . Fosamax [Alendronate]     aching  . Ibuprofen   . Paxil [Paroxetine Hcl] Nausea Only  . Remeron [Mirtazapine]     Weight gain   . Tamoxifen     Hair loss @ low dose  . Vasotec [Enalapril] Cough    ROS Constitutional: Denies fever, chills, weight loss/gain, headaches, insomnia,  fatigue, night sweats, and change in appetite. Eyes: Denies redness, blurred vision, diplopia, discharge, itchy, watery eyes.  ENT: + popping and clicking in the right ear with nonproductive cough and rhinorrhea. Denies discharge, congestion, post nasal drip, sore throat, earache, dental pain, Tinnitus, Vertigo snoring.  Cardio: Denies chest pain, palpitations, irregular heartbeat,  dyspnea, diaphoresis, orthopnea, PND, claudication, edema Respiratory: denies cough, dyspnea,pleurisy, hoarseness, wheezing.  Gastrointestinal: Denies dysphagia, heartburn,  water brash, pain, cramps, nausea, vomiting, bloating, diarrhea, constipation, hematemesis, melena, hematochezia,  hemorrhoids Genitourinary: Denies dysuria, frequency, urgency, nocturia, hesitancy, discharge, hematuria, flank pain Musculoskeletal: Denies arthralgia, myalgia, stiffness, Jt. Swelling, pain, limp, and strain/sprain. Skin: Denies puritis, rash, hives, warts, acne, eczema, changing in skin lesion Neuro: Weakness, tremor, incoordination, spasms, paresthesia, pain Psychiatric: Denies confusion, memory loss, sensory loss Endocrine: Denies change in weight, skin, hair change, nocturia, and paresthesia, Diabetic Polys, visual blurring, hyper /hypo glycemic episodes.  Heme/Lymph: Excessive bleeding, bruising, enlarged lymph nodes Medical History:  Past Medical History  Diagnosis Date  . Thyroid disease   . Hypertension   . Hyperlipidemia   . Diabetes mellitus without complication   . Hypothyroid   . Depression   . Cancer     left breast  . Vitamin  D deficiency   . Iron deficiency anemia   . Allergy   . Insomnia    Family history- Review and unchanged Social history- Review and unchanged Physical Exam: Filed Vitals:   10/04/13 1349  BP: 128/78  Pulse: 76  Temp: 98.5 F (36.9 C)  Resp: 16   Filed Weights   10/04/13 1349  Weight: 161 lb (73.029 kg)   General Appearance: Well nourished, in no apparent distress. Eyes:  PERRLA, EOMs, conjunctiva no swelling or erythema, normal fundi and vessels. Sinuses: No Frontal/maxillary tenderness ENT/Mouth: Ext aud canals clear, with TMs without erythema, bulging.No erythema, swelling, or exudate on post pharynx.  Tonsils not swollen or erythematous. Hearing normal.  Neck: Supple, thyroid normal.  Respiratory: Respiratory effort normal, BS equal bilaterally without rales, rhonci, wheezing or stridor.  Cardio: Heart sounds normal, regular rate and rhythm without murmurs, rubs or gallops. Peripheral pulses brisk and equal bilaterally, without edema.  Abdomen: Flat, soft, with bowel sounds. Nontender, no guarding, rebound, hernias, masses, or organomegaly.  Lymphatics: Non tender without lymphadenopathy.  Musculoskeletal: Full ROM all peripheral extremities, joint stability, 5/5 strength, and normal gait. Skin: Warm, dry without rashes, lesions, ecchymosis.  Neuro: Cranial nerves intact, reflexes equal bilaterally. Normal muscle tone, no cerebellar symptoms. Sensation intact.  Pysch: Awake and oriented X 3, normal affect, Insight and Judgment appropriate.   Assessement and Plan:  Hypertension: Continue medication, monitor blood pressure at home. Continue DASH diet. Cholesterol: Continue diet and exercise. Check cholesterol.  Diabetes-Continue diet and exercise. Check A1C Hypothyroid- check TSH Vitamin D Def- check level and continue medications.  Sinusitis- has had for one week and is feeling better but if WBC is elevated we will call in ABX.   Quentin Mulling 1:56 PM

## 2013-10-04 NOTE — Patient Instructions (Signed)
Serous Otitis Media  Serous otitis media is fluid in the middle ear space. This space contains the bones for hearing and air. Air in the middle ear space helps to transmit sound.  The air gets there through the eustachian tube. This tube goes from the back of the nose (nasopharynx) to the middle ear space. It keeps the pressure in the middle ear the same as the outside world. It also helps to drain fluid from the middle ear space. CAUSES  Serous otitis media occurs when the eustachian tube gets blocked. Blockage can come from:  Ear infections.  Colds and other upper respiratory infections.  Allergies.  Irritants such as cigarette smoke.  Sudden changes in air pressure (such as descending in an airplane).  Enlarged adenoids.  A mass in the nasopharynx. During colds and upper respiratory infections, the middle ear space can become temporarily filled with fluid. This can happen after an ear infection also. Once the infection clears, the fluid will generally drain out of the ear through the eustachian tube. If it does not, then serous otitis media occurs. SIGNS AND SYMPTOMS   Hearing loss.  A feeling of fullness in the ear, without pain.  Young children may not show any symptoms but may show slight behavioral changes, such as agitation, ear pulling, or crying. DIAGNOSIS  Serous otitis media is diagnosed by an ear exam. Tests may be done to check on the movement of the eardrum. Hearing exams may also be done. TREATMENT  The fluid most often goes away without treatment. If allergy is the cause, allergy treatment may be helpful. Fluid that persists for several months may require minor surgery. A small tube is placed in the eardrum to:  Drain the fluid.  Restore the air in the middle ear space. In certain situations, antibiotics are used to avoid surgery. Surgery may be done to remove enlarged adenoids (if this is the cause). HOME CARE INSTRUCTIONS   Keep children away from tobacco  smoke.  Be sure to keep any follow-up appointments. SEEK MEDICAL CARE IF:   Your hearing is not better in 3 months.  Your hearing is worse.  You have ear pain.  You have drainage from the ear.  You have dizziness.  You have serous otitis media only in one ear or have any bleeding from your nose (epistaxis).  You notice a lump on your neck. MAKE SURE YOU:  Understand these instructions.   Will watch your condition.   Will get help right away if you are not doing well or get worse.  Document Released: 01/30/2004 Document Revised: 07/12/2013 Document Reviewed: 06/06/2013 [employer] Patient Information 2014 [employer].  

## 2013-11-06 ENCOUNTER — Ambulatory Visit
Admission: RE | Admit: 2013-11-06 | Discharge: 2013-11-06 | Disposition: A | Discharge status: Home / Self Care | Payer: Medicare Other | Source: Ambulatory Visit

## 2013-11-06 DIAGNOSIS — Z1231 Encounter for screening mammogram for malignant neoplasm of breast: Secondary | ICD-10-CM

## 2013-11-06 DIAGNOSIS — Z853 Personal history of malignant neoplasm of breast: Secondary | ICD-10-CM

## 2013-12-12 ENCOUNTER — Other Ambulatory Visit (HOSPITAL_BASED_OUTPATIENT_CLINIC_OR_DEPARTMENT_OTHER): Payer: Medicare Other

## 2013-12-12 DIAGNOSIS — C50419 Malignant neoplasm of upper-outer quadrant of unspecified female breast: Secondary | ICD-10-CM

## 2013-12-12 DIAGNOSIS — C50919 Malignant neoplasm of unspecified site of unspecified female breast: Secondary | ICD-10-CM

## 2013-12-12 LAB — COMPREHENSIVE METABOLIC PANEL (CC13)
ALBUMIN: 3.7 g/dL (ref 3.5–5.0)
ALK PHOS: 92 U/L (ref 40–150)
ALT: 18 U/L (ref 0–55)
AST: 23 U/L (ref 5–34)
Anion Gap: 10 mEq/L (ref 3–11)
BUN: 13 mg/dL (ref 7.0–26.0)
CO2: 30 mEq/L — ABNORMAL HIGH (ref 22–29)
Calcium: 9.8 mg/dL (ref 8.4–10.4)
Chloride: 101 mEq/L (ref 98–109)
Creatinine: 0.7 mg/dL (ref 0.6–1.1)
GLUCOSE: 92 mg/dL (ref 70–140)
POTASSIUM: 3.4 meq/L — AB (ref 3.5–5.1)
SODIUM: 141 meq/L (ref 136–145)
TOTAL PROTEIN: 7.1 g/dL (ref 6.4–8.3)
Total Bilirubin: 0.45 mg/dL (ref 0.20–1.20)

## 2013-12-12 LAB — CBC WITH DIFFERENTIAL/PLATELET
BASO%: 1 % (ref 0.0–2.0)
Basophils Absolute: 0.1 10*3/uL (ref 0.0–0.1)
EOS ABS: 0 10*3/uL (ref 0.0–0.5)
EOS%: 1 % (ref 0.0–7.0)
HCT: 42.1 % (ref 34.8–46.6)
HGB: 13.9 g/dL (ref 11.6–15.9)
LYMPH#: 2 10*3/uL (ref 0.9–3.3)
LYMPH%: 39.9 % (ref 14.0–49.7)
MCH: 26.2 pg (ref 25.1–34.0)
MCHC: 33 g/dL (ref 31.5–36.0)
MCV: 79.5 fL (ref 79.5–101.0)
MONO#: 0.5 10*3/uL (ref 0.1–0.9)
MONO%: 9.2 % (ref 0.0–14.0)
NEUT%: 48.9 % (ref 38.4–76.8)
NEUTROS ABS: 2.5 10*3/uL (ref 1.5–6.5)
Platelets: 287 10*3/uL (ref 145–400)
RBC: 5.3 10*6/uL (ref 3.70–5.45)
RDW: 14.1 % (ref 11.2–14.5)
WBC: 5.1 10*3/uL (ref 3.9–10.3)

## 2013-12-19 ENCOUNTER — Telehealth: Payer: Self-pay | Admitting: Oncology

## 2013-12-19 ENCOUNTER — Ambulatory Visit (HOSPITAL_BASED_OUTPATIENT_CLINIC_OR_DEPARTMENT_OTHER): Payer: Medicare Other | Admitting: Oncology

## 2013-12-19 VITALS — BP 137/83 | HR 88 | Temp 98.4°F | Resp 18 | Ht 67.0 in | Wt 161.7 lb

## 2013-12-19 DIAGNOSIS — Z170 Estrogen receptor positive status [ER+]: Secondary | ICD-10-CM

## 2013-12-19 DIAGNOSIS — C50912 Malignant neoplasm of unspecified site of left female breast: Secondary | ICD-10-CM | POA: Insufficient documentation

## 2013-12-19 DIAGNOSIS — C50419 Malignant neoplasm of upper-outer quadrant of unspecified female breast: Secondary | ICD-10-CM

## 2013-12-19 DIAGNOSIS — C50919 Malignant neoplasm of unspecified site of unspecified female breast: Secondary | ICD-10-CM

## 2013-12-19 NOTE — Telephone Encounter (Signed)
, °

## 2013-12-19 NOTE — Progress Notes (Signed)
Hematology and Oncology Follow Up Visit  Tammy Hensley [id number] [birth year] 73 y.o. 12/19/2013   PCP: Alesia Richards, MD GYN: SU:  OTHER MD:   Interim History:   Tammy Hensley returns today for followup of her remote breast cancer. Interval history is chiefly significant for her sister staff late last year. She was 13, living in assisted living, and apparently died from sepsis.    HPI: [facility] woman with a history of a multifocal infiltrating lobular carcinoma for which she underwent a left lumpectomy with sentinel node dissection on 09/14/2005. Tumor was ER 92%/PR 86% positive HER-2 negative with a Ki-67 of 6%. She completed radiation therapy on 01/28/2006. She then completed 5 years worth of tamoxifen 20 mg by mouth daily on 03/25/2011.  Review of Systems: The patient denies unusual headaches, visual changes, nausea, vomiting, stiff neck, dizziness, or gait imbalance. There has been no cough, phlegm production, or pleurisy, no chest pain or pressure, and no change in bowel or bladder habits. The patient denies fever, rash, bleeding, unexplained fatigue or unexplained weight loss. She is grieving appropriately the death of her sister, but denies depression or anxiety. She is having some insomnia. A detailed review of systems was otherwise entirely negative.    Medications:   I have reviewed the patient's current medications.  Current Outpatient Prescriptions  Medication Sig Dispense Refill  . aspirin 81 MG tablet Take 81 mg by mouth daily.        . Calcium Carbonate-Vitamin D (CALCIUM 600 + D PO) Take by mouth 2 (two) times daily.       Marland Kitchen levothyroxine (SYNTHROID, LEVOTHROID) 88 MCG tablet Take 88 mcg by mouth daily. 1/2 daily      . Multiple Vitamins-Minerals (MULTIVITAMIN PO) Take by mouth.      . pravastatin (PRAVACHOL) 40 MG tablet Take 40 mg by mouth daily.      Marland Kitchen triamterene-hydrochlorothiazide (MAXZIDE) 75-50 MG per tablet Take 1 tablet by mouth daily.         Marland Kitchen VITAMIN D, ERGOCALCIFEROL, PO Take 4,000 Int'l Units by mouth daily.        No current facility-administered medications for this visit.   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 At home it's just her and her husband. She has 2 children and 2 grandchildren.  Allergies:  Allergies  Allergen Reactions  . Calan [Verapamil]     Constipation   . Effexor [Venlafaxine]   . Erythromycin   . Femara [Letrozole]   . Fosamax [Alendronate]     aching  . Ibuprofen   . Paxil [Paroxetine Hcl] Nausea Only  . Remeron [Mirtazapine]     Weight gain   . Tamoxifen     Hair loss @ low dose  . Vasotec [Enalapril] Cough    Physical Exam: Middle-aged African American woman in no acute distress Filed Vitals:   12/19/13 1308  BP: 137/83  Pulse: 88  Temp: 98.4 F (36.9 C)  Resp: 18   Sclerae unicteric, pupils equal and reactive Oropharynx clear and moist-- teeth in good repair No cervical or supraclavicular adenopathy Lungs no rales or rhonchi Heart regular rate and rhythm Abd soft, nontender, positive bowel sounds MSK no focal spinal tenderness, no upper extremity lymphedema Neuro: nonfocal, well oriented, appropriate affect Breasts: The right breast is unremarkable. The left breast is status post lumpectomy and radiation. There is no evidence of local recurrence. The left axilla is benign.    Lab Results: Lab Results  Component Value Date  WBC 5.1 12/12/2013   HGB 13.9 12/12/2013   HCT 42.1 12/12/2013   MCV 79.5 12/12/2013   PLT 287 12/12/2013   NEUTROABS 2.5 12/12/2013     Chemistry      Component Value Date/Time   NA 141 12/12/2013 1303   NA 139 10/04/2013 1418   K 3.4* 12/12/2013 1303   K 3.7 10/04/2013 1418   CL 100 10/04/2013 1418   CL 99 12/20/2012 1336   CO2 30* 12/12/2013 1303   CO2 31 10/04/2013 1418   BUN 13.0 12/12/2013 1303   BUN 8 10/04/2013 1418   CREATININE 0.7 12/12/2013 1303   CREATININE 0.71 10/04/2013 1418   CREATININE 0.83 12/21/2011 0835       Component Value Date/Time   CALCIUM 9.8 12/12/2013 1303   CALCIUM 10.0 10/04/2013 1418   ALKPHOS 92 12/12/2013 1303   ALKPHOS 83 10/04/2013 1418   AST 23 12/12/2013 1303   AST 38* 10/04/2013 1418   ALT 18 12/12/2013 1303   ALT 23 10/04/2013 1418   BILITOT 0.45 12/12/2013 1303   BILITOT 0.6 10/04/2013 1418        Radiological Studies: DIGITAL SCREENING BILATERAL MAMMOGRAM WITH CAD 11/06/2013 COMPARISON: Previous exam(s).  ACR Breast Density Category c: The breasts are heterogeneously  dense, which may obscure small masses.  FINDINGS:  There are no findings suspicious for malignancy. Images were  processed with CAD.  IMPRESSION:  No mammographic evidence of malignancy. A result letter of this  screening mammogram will be mailed directly to the patient.  RECOMMENDATION:  Screening mammogram in one year. (Code:SM-B-01Y)  BI-RADS CATEGORY 1: Negative  Electronically Signed  By: Everlean Alstrom M.D   Assessment:    73 y.o.   woman with a history of a multifocal infiltrating lobular carcinoma noted to be ER at 92%, PR at 86% positive HER-2 negative with a Ki-67 of 6%.   (1) She underwent a left lumpectomy with sentinel node dissection on 09/14/2005 for a mpT2 pN0, stage IIA invasive lobular breast cancer, grade 1, with negative margins  (2)  radiation therapy which was completed on 01/28/2006  (3) completed 5 years worth of adjuvant hormonal therapy in the form of tamoxifen 20 mg per day on 03/25/2011.   Plan:  Tammy Hensley is doing fine from a breast cancer point of view. She is grieving appropriately not only for her sister who died late in 01/17/13 but also for 3 friends who died last year, 2 with breast cancer, one with acute leukemia.  I am going to see her one more time, a year from now, and she will "graduate" from followup here at that time. She knows to call for any problems that may develop before that visit.  Chauncey Cruel, MD  12/19/2013

## 2014-01-03 ENCOUNTER — Encounter: Payer: Self-pay | Admitting: Internal Medicine

## 2014-01-03 ENCOUNTER — Ambulatory Visit (INDEPENDENT_AMBULATORY_CARE_PROVIDER_SITE_OTHER): Payer: Medicare Other | Admitting: Internal Medicine

## 2014-01-03 VITALS — BP 136/80 | HR 88 | Temp 97.9°F | Resp 16 | Ht 62.5 in | Wt 161.0 lb

## 2014-01-03 DIAGNOSIS — R7309 Other abnormal glucose: Secondary | ICD-10-CM

## 2014-01-03 DIAGNOSIS — Z79899 Other long term (current) drug therapy: Secondary | ICD-10-CM

## 2014-01-03 DIAGNOSIS — Z0000 Encounter for general adult medical examination without abnormal findings: Secondary | ICD-10-CM

## 2014-01-03 DIAGNOSIS — I10 Essential (primary) hypertension: Secondary | ICD-10-CM

## 2014-01-03 DIAGNOSIS — Z1212 Encounter for screening for malignant neoplasm of rectum: Secondary | ICD-10-CM

## 2014-01-03 DIAGNOSIS — E785 Hyperlipidemia, unspecified: Secondary | ICD-10-CM

## 2014-01-03 DIAGNOSIS — E559 Vitamin D deficiency, unspecified: Secondary | ICD-10-CM

## 2014-01-03 LAB — CBC WITH DIFFERENTIAL/PLATELET
BASOS PCT: 0 % (ref 0–1)
Basophils Absolute: 0 10*3/uL (ref 0.0–0.1)
EOS PCT: 1 % (ref 0–5)
Eosinophils Absolute: 0.1 10*3/uL (ref 0.0–0.7)
HEMATOCRIT: 40.2 % (ref 36.0–46.0)
HEMOGLOBIN: 13.8 g/dL (ref 12.0–15.0)
LYMPHS PCT: 42 % (ref 12–46)
Lymphs Abs: 2.1 10*3/uL (ref 0.7–4.0)
MCH: 25.9 pg — ABNORMAL LOW (ref 26.0–34.0)
MCHC: 34.3 g/dL (ref 30.0–36.0)
MCV: 75.4 fL — AB (ref 78.0–100.0)
MONO ABS: 0.4 10*3/uL (ref 0.1–1.0)
MONOS PCT: 9 % (ref 3–12)
NEUTROS ABS: 2.4 10*3/uL (ref 1.7–7.7)
Neutrophils Relative %: 48 % (ref 43–77)
Platelets: 328 10*3/uL (ref 150–400)
RBC: 5.33 MIL/uL — ABNORMAL HIGH (ref 3.87–5.11)
RDW: 14.7 % (ref 11.5–15.5)
WBC: 5.1 10*3/uL (ref 4.0–10.5)

## 2014-01-03 LAB — HEMOGLOBIN A1C
Hgb A1c MFr Bld: 7 % — ABNORMAL HIGH (ref <5.7)
Mean Plasma Glucose: 154 mg/dL — ABNORMAL HIGH (ref <117)

## 2014-01-03 NOTE — Progress Notes (Signed)
Patient ID: Tammy Hensley, female   DOB: October 07, 1941, 73 y.o.   MRN: [id number]  Annual Screening Comprehensive Examination  This very nice 73 y.o. [name] presents for complete physical.  Patient has been followed for HTN, Diabetes  Prediabetes, Hyperlipidemia, and Vitamin D Deficiency.    HTN predates since 1995. Patient's BP has been controlled at home. Today's BP: 136/80 mmHg. Patient denies any cardiac symptoms as chest pain, palpitations, shortness of breath, dizziness or ankle swelling.   Patient's hyperlipidemia is controlled with diet and medications. Patient denies myalgias or other medication SE's.    Lab Results  Component Value Date   CHOL 180 10/04/2013   HDL 47 10/04/2013   LDLCALC 110* 10/04/2013   TRIG 115 10/04/2013   CHOLHDL 3.8 10/04/2013    Patient has Diabetes with A1c 6.6% in 2009 and last  A1c 7.1% with elevated insulin 84 in Aug 2014. She is attempting to control with diet. Patient denies reactive hypoglycemic symptoms, visual blurring, diabetic polys, or paresthesias.   Finally, patient has history of Vitamin D Deficiency with last vitamin D 84 in Aug 2014.   Medication List       aspirin 81 MG tablet  Take 81 mg by mouth daily.     CALCIUM 600 + D PO  Take by mouth 2 (two) times daily.     levothyroxine 88 MCG tablet  Commonly known as:  SYNTHROID, LEVOTHROID  Take 88 mcg by mouth daily. 1/2 daily     MULTIVITAMIN PO  Take by mouth.     pravastatin 40 MG tablet  Commonly known as:  PRAVACHOL  Take 40 mg by mouth daily.     triamterene-hydrochlorothiazide 75-50 MG per tablet  Commonly known as:  MAXZIDE  Take 1 tablet by mouth daily.     VITAMIN D (ERGOCALCIFEROL) PO  Take 4,000 Int'l Units by mouth daily.        Allergies  Allergen Reactions  . Calan [Verapamil]     Constipation   . Effexor [Venlafaxine]   . Erythromycin   . Femara [Letrozole]   . Fosamax [Alendronate]     aching  . Ibuprofen   . Paxil [Paroxetine Hcl] Nausea Only  .  Remeron [Mirtazapine]     Weight gain   . Tamoxifen     Hair loss @ low dose  . Vasotec [Enalapril] Cough    Past Medical History  Diagnosis Date  . Thyroid disease   . Hypertension   . Hyperlipidemia   . Diabetes mellitus without complication   . Hypothyroid   . Depression   . Cancer     left breast  . Vitamin D deficiency   . Iron deficiency anemia   . Allergy   . Insomnia     Past Surgical History  Procedure Laterality Date  . Mastectomy Left 2006    Family History  Problem Relation Age of Onset  . Heart disease Mother   . Heart attack Mother   . Cancer Father   . Heart attack Father     History  Substance Use Topics  . Smoking status: Never Smoker   . Smokeless tobacco: Not on file  . Alcohol Use: No    ROS Constitutional: Denies fever, chills, weight loss/gain, headaches, insomnia, fatigue, night sweats, and change in appetite. Eyes: Denies redness, blurred vision, diplopia, discharge, itchy, watery eyes.  ENT: Denies discharge, congestion, post nasal drip, epistaxis, sore throat, earache, hearing loss, dental pain, Tinnitus, Vertigo, Sinus pain, snoring.  Cardio:  Denies chest pain, palpitations, irregular heartbeat, syncope, dyspnea, diaphoresis, orthopnea, PND, claudication, edema Respiratory: denies cough, dyspnea, DOE, pleurisy, hoarseness, laryngitis, wheezing.  Gastrointestinal: Denies dysphagia, heartburn, reflux, water brash, pain, cramps, nausea, vomiting, bloating, diarrhea, constipation, hematemesis, melena, hematochezia, jaundice, hemorrhoids Genitourinary: Denies dysuria, frequency, urgency, nocturia, hesitancy, discharge, hematuria, flank pain Breast:Breast lumps, nipple discharge, bleeding.  Musculoskeletal: Denies arthralgia, myalgia, stiffness, Jt. Swelling, pain, limp, and strain/sprain. Skin: Denies puritis, rash, hives, warts, acne, eczema, changing in skin lesion Neuro: No weakness, tremor, incoordination, spasms, paresthesia,  pain Psychiatric: Denies confusion, memory loss, sensory loss Endocrine: Denies change in weight, skin, hair change, nocturia, and paresthesia, diabetic polys, visual blurring, hyper / hypo glycemic episodes.  Heme/Lymph: No excessive bleeding, bruising, enlarged lymph nodes.  BP: 136/80  Pulse: 88  Temp: 97.9 F (36.6 C)  Resp: 16    Estimated body mass index is 28.96 kg/(m^2) as calculated from the following:   Height as of this encounter: 5' 2.5" (1.588 m).   Weight as of this encounter: 161 lb (73.029 kg).  Physical Exam General Appearance: Well nourished, in no apparent distress. Eyes: PERRLA, EOMs, conjunctiva no swelling or erythema, normal fundi and vessels. Sinuses: No frontal/maxillary tenderness ENT/Mouth: EACs patent / TMs  nl. Nares clear without erythema, swelling, mucoid exudates. Oral hygiene is good. No erythema, swelling, or exudate. Tongue normal, non-obstructing. Tonsils not swollen or erythematous. Hearing normal.  Neck: Supple, thyroid normal. No bruits, nodes or JVD. Respiratory: Respiratory effort normal.  BS equal and clear bilateral without rales, rhonci, wheezing or stridor. Cardio: Heart sounds are normal with regular rate and rhythm and no murmurs, rubs or gallops. Peripheral pulses are normal and equal bilaterally without edema. No aortic or femoral bruits. Chest: symmetric with normal excursions and percussion. Breasts: Symmetric, without lumps, nipple discharge, retractions, or fibrocystic changes.  Abdomen: Flat, soft, with bowl sounds. Nontender, no guarding, rebound, hernias, masses, or organomegaly.  Lymphatics: Non tender without lymphadenopathy.  Musculoskeletal: Full ROM all peripheral extremities, joint stability, 5/5 strength, and normal gait. Skin: Warm and dry without rashes, lesions, cyanosis, clubbing or  ecchymosis.  Neuro: Cranial nerves intact, reflexes equal bilaterally. Normal muscle tone, no cerebellar symptoms. Sensation intact.   Pysch: Awake and oriented X 3, normal affect, Insight and Judgment appropriate.   Assessment and Plan  1. Annual Screening Examination 2. Hypertension  3. Hyperlipidemia 4. Pre Diabetes 5. Vitamin D Deficiency  Continue prudent diet as discussed, weight control, BP monitoring, regular exercise, and medications. Discussed med's effects and SE's. Screening labs and tests as requested with regular follow-up as recommended.

## 2014-01-03 NOTE — Patient Instructions (Signed)

## 2014-01-04 LAB — HEPATIC FUNCTION PANEL
ALK PHOS: 93 U/L (ref 39–117)
ALT: 20 U/L (ref 0–35)
AST: 29 U/L (ref 0–37)
Albumin: 4.4 g/dL (ref 3.5–5.2)
Bilirubin, Direct: 0.1 mg/dL (ref 0.0–0.3)
Indirect Bilirubin: 0.5 mg/dL (ref 0.2–1.2)
TOTAL PROTEIN: 6.9 g/dL (ref 6.0–8.3)
Total Bilirubin: 0.6 mg/dL (ref 0.2–1.2)

## 2014-01-04 LAB — URINALYSIS, MICROSCOPIC ONLY
Bacteria, UA: NONE SEEN
Casts: NONE SEEN
Crystals: NONE SEEN
Squamous Epithelial / LPF: NONE SEEN

## 2014-01-04 LAB — LIPID PANEL
Cholesterol: 140 mg/dL (ref 0–200)
HDL: 44 mg/dL (ref >39)
LDL CALC: 79 mg/dL (ref 0–99)
Total CHOL/HDL Ratio: 3.2 Ratio
Triglycerides: 85 mg/dL (ref <150)
VLDL: 17 mg/dL (ref 0–40)

## 2014-01-04 LAB — MICROALBUMIN / CREATININE URINE RATIO
CREATININE, URINE: 186.1 mg/dL
Microalb Creat Ratio: 4.4 mg/g (ref 0.0–30.0)
Microalb, Ur: 0.81 mg/dL (ref 0.00–1.89)

## 2014-01-04 LAB — BASIC METABOLIC PANEL WITH GFR
BUN: 11 mg/dL (ref 6–23)
CHLORIDE: 97 meq/L (ref 96–112)
CO2: 29 mEq/L (ref 19–32)
Calcium: 9.8 mg/dL (ref 8.4–10.5)
Creat: 0.69 mg/dL (ref 0.50–1.10)
GFR, Est African American: >89 mL/min
GFR, Est Non African American: 87 mL/min
GLUCOSE: 107 mg/dL — AB (ref 70–99)
Potassium: 3.8 mEq/L (ref 3.5–5.3)
Sodium: 139 mEq/L (ref 135–145)

## 2014-01-04 LAB — INSULIN, FASTING: Insulin fasting, serum: 21 u[IU]/mL (ref 3–28)

## 2014-01-04 LAB — VITAMIN D 25 HYDROXY (VIT D DEFICIENCY, FRACTURES): VIT D 25 HYDROXY: 105 ng/mL — AB (ref 30–89)

## 2014-01-04 LAB — TSH: TSH: 0.472 u[IU]/mL (ref 0.350–4.500)

## 2014-01-04 LAB — MAGNESIUM: Magnesium: 2 mg/dL (ref 1.5–2.5)

## 2014-01-07 ENCOUNTER — Other Ambulatory Visit: Payer: Self-pay | Admitting: Internal Medicine

## 2014-01-07 DIAGNOSIS — E119 Type 2 diabetes mellitus without complications: Secondary | ICD-10-CM

## 2014-01-07 MED ORDER — METFORMIN HCL ER 500 MG PO TB24: ORAL_TABLET | ORAL | Status: DC

## 2014-01-19 ENCOUNTER — Other Ambulatory Visit (INDEPENDENT_AMBULATORY_CARE_PROVIDER_SITE_OTHER): Payer: Medicare Other | Admitting: *Deleted

## 2014-01-19 DIAGNOSIS — Z1212 Encounter for screening for malignant neoplasm of rectum: Secondary | ICD-10-CM

## 2014-01-19 LAB — POC HEMOCCULT BLD/STL (HOME/3-CARD/SCREEN)
Card #2 Fecal Occult Blod, POC: NEGATIVE
Card #3 Fecal Occult Blood, POC: NEGATIVE
FECAL OCCULT BLD: NEGATIVE

## 2014-04-04 ENCOUNTER — Encounter: Payer: Self-pay | Admitting: Physician Assistant

## 2014-04-04 ENCOUNTER — Ambulatory Visit (INDEPENDENT_AMBULATORY_CARE_PROVIDER_SITE_OTHER): Payer: Medicare Other | Admitting: Physician Assistant

## 2014-04-04 VITALS — BP 120/78 | HR 76 | Temp 97.9°F | Resp 16 | Wt 159.0 lb

## 2014-04-04 DIAGNOSIS — E785 Hyperlipidemia, unspecified: Secondary | ICD-10-CM

## 2014-04-04 DIAGNOSIS — R7309 Other abnormal glucose: Secondary | ICD-10-CM

## 2014-04-04 DIAGNOSIS — Z79899 Other long term (current) drug therapy: Secondary | ICD-10-CM

## 2014-04-04 DIAGNOSIS — I10 Essential (primary) hypertension: Secondary | ICD-10-CM

## 2014-04-04 DIAGNOSIS — Z789 Other specified health status: Secondary | ICD-10-CM

## 2014-04-04 DIAGNOSIS — D509 Iron deficiency anemia, unspecified: Secondary | ICD-10-CM

## 2014-04-04 DIAGNOSIS — E559 Vitamin D deficiency, unspecified: Secondary | ICD-10-CM

## 2014-04-04 DIAGNOSIS — Z1331 Encounter for screening for depression: Secondary | ICD-10-CM

## 2014-04-04 DIAGNOSIS — E039 Hypothyroidism, unspecified: Secondary | ICD-10-CM

## 2014-04-04 DIAGNOSIS — Z0000 Encounter for general adult medical examination without abnormal findings: Secondary | ICD-10-CM

## 2014-04-04 LAB — CBC WITH DIFFERENTIAL/PLATELET
BASOS ABS: 0 10*3/uL (ref 0.0–0.1)
Basophils Relative: 1 % (ref 0–1)
EOS PCT: 2 % (ref 0–5)
Eosinophils Absolute: 0.1 10*3/uL (ref 0.0–0.7)
HEMATOCRIT: 41.7 % (ref 36.0–46.0)
Hemoglobin: 14.1 g/dL (ref 12.0–15.0)
LYMPHS PCT: 49 % — AB (ref 12–46)
Lymphs Abs: 2.2 10*3/uL (ref 0.7–4.0)
MCH: 26 pg (ref 26.0–34.0)
MCHC: 33.8 g/dL (ref 30.0–36.0)
MCV: 76.8 fL — AB (ref 78.0–100.0)
Monocytes Absolute: 0.4 10*3/uL (ref 0.1–1.0)
Monocytes Relative: 8 % (ref 3–12)
Neutro Abs: 1.8 10*3/uL (ref 1.7–7.7)
Neutrophils Relative %: 40 % — ABNORMAL LOW (ref 43–77)
Platelets: 317 10*3/uL (ref 150–400)
RBC: 5.43 MIL/uL — ABNORMAL HIGH (ref 3.87–5.11)
RDW: 14.4 % (ref 11.5–15.5)
WBC: 4.4 10*3/uL (ref 4.0–10.5)

## 2014-04-04 MED ORDER — HYOSCYAMINE SULFATE 0.125 MG SL SUBL: 0.1250 mg | SUBLINGUAL_TABLET | SUBLINGUAL | Status: DC | PRN

## 2014-04-04 MED ORDER — PROMETHAZINE HCL 25 MG PO TABS: 25.0000 mg | ORAL_TABLET | Freq: Four times a day (QID) | ORAL | Status: DC | PRN

## 2014-04-04 MED ORDER — AZITHROMYCIN 250 MG PO TABS: ORAL_TABLET | ORAL | Status: DC

## 2014-04-04 MED ORDER — MELOXICAM 15 MG PO TABS: ORAL_TABLET | ORAL | Status: DC

## 2014-04-04 MED ORDER — CIPROFLOXACIN HCL 500 MG PO TABS: 500.0000 mg | ORAL_TABLET | Freq: Two times a day (BID) | ORAL | Status: AC

## 2014-04-04 MED ORDER — PREDNISONE 20 MG PO TABS
ORAL_TABLET | ORAL | Status: DC
Start: 2014-04-04 — End: 2014-07-05

## 2014-04-04 NOTE — Patient Instructions (Addendum)
We are starting you on Metformin to prevent or treat diabetes. Metformin does not cause low blood sugars. In order to create energy your cells need insulin and sugar but sometime your cells do not accept the insulin and this can cause increased sugars and decreased energy. The Metformin helps your cells accept insulin and the sugar to give you more energy.   The two most common side effects are nausea and diarrhea, follow these rules to avoid it! You can take imodium per box instructions when starting metformin if needed.   Rules of metformin: 1) start out slow with only one pill daily. Our goal for you is 4 pills a day or 2000mg  total.  2) take with your largest meal. 3) Take with least amount of carbs.   Call if you have any problems.   Allergies can try allegra/fexafinadine not sedating, or can do zyrtec or certizine which can be sedating so take at night.     Bad carbs also include fruit juice, alcohol, and sweet tea. These are empty calories that do not signal to your brain that you are full.   Please remember the good carbs are still carbs which convert into sugar. So please measure them out no more than 1/2-1 cup of rice, oatmeal, pasta, and beans.  Veggies are however free foods! Pile them on.   I like lean protein at every meal such as chicken, [place], pork chops, cottage cheese, etc. Just do not fry these meats and please center your meal around vegetable, the meats should be a side dish.   No all fruit is created equal. Please see the list below, the fruit at the bottom is higher in sugars than the fruit at the top    If you are traveling you can take these medications to be more prepared. If you get chest pain, shortness of breath or abdominal pain please go to the hospital wherever you may be.   Ciprofloxacin is good for travelers diarrhea, you can take 2 pills a day for 7 days. Or it is also good for urinary tract infections, you can take 2 a day for 7 days.  Zpak- is good  for sinus infections please finish as prescribed Phenergran is for nausea but it sedating so plan on eating and sleeping.  Levsin is good for nausea, diarrhea, or abdominal cramping- it can constipate you so don't take too much. This dissolves under your tongue.  Prednisone is good for joint pain or rashes or spider bites- you can take it as prescribed but if you are feeling better you can stop it early.

## 2014-04-04 NOTE — Progress Notes (Signed)
Assessment:   1. Hypertension - CBC with Differential - BASIC METABOLIC PANEL WITH GFR - Hepatic function panel  2. Hypothyroid - check TSH  3. Hyperlipidemia - Lipid panel  4. Vitamin D deficiency - Vit D  25 hydroxy (rtn osteoporosis monitoring)  5. Iron deficiency anemia Continue to eat leafy greens -monitor CBC  6. Other abnormal glucose Discussed general issues about diabetes pathophysiology and management., Educational material distributed., Suggested low cholesterol diet., Encouraged aerobic exercise., Discussed foot care., Reminded to get yearly retinal exam. -Given metformin rules and encouraged to try to take the medication.  - Hemoglobin A1c - Insulin, fasting  7. Encounter for long-term (current) use of other medications - Magnesium  8. Right knee OA -RICE, rest, Mobic 1/2-1 with food PRN, if not better will refer to ortho for injection   Plan:   During the course of the visit the patient was educated and counseled about appropriate screening and preventive services including:    Pneumococcal vaccine   Influenza vaccine  Td vaccine  Screening electrocardiogram  Screening mammography  Bone densitometry screening  Colorectal cancer screening  Diabetes screening  Glaucoma screening  Nutrition counseling   Screening recommendations, referrals:  Vaccinations: Tdap vaccine not indicated Influenza vaccine declined Pneumococcal vaccine not indicated Shingles vaccine declined Hep B vaccine not indicated  Nutrition assessed and recommended  Colonoscopy up to date Mammogram requested Pap smear not indicated Pelvic exam not indicated Recommended yearly ophthalmology/optometry visit for glaucoma screening and checkup Recommended yearly dental visit for hygiene and checkup Advanced directives - requested  Conditions/risks identified: BMI: Discussed weight loss, diet, and increase physical activity.  Increase physical activity: AHA recommends  150 minutes of physical activity a week.  Medications reviewed DEXA- will order at CPE Diabetes is not at goal, ACE/ARB therapy: No, Reason not on Ace Inhibitor/ARB therapy:  allergy Urinary Incontinence is not an issue: discussed non pharmacology and pharmacology options.  Fall risk: low- discussed PT, home fall assessment, medications.    Subjective:   Tammy Hensley is a 73 y.o. female who presents for Medicare Annual Wellness Visit and 3 month follow up on hypertension, diabetes, hyperlipidemia, vitamin D def.  Date of last medicare wellness visit is unknown.   Her blood pressure has been controlled at home, today their BP is BP: 120/78 mmHg She does not workout, but she would like to start. She denies chest pain, shortness of breath, dizziness.  She is on cholesterol medication and denies myalgias. Her cholesterol is at goal. The cholesterol last visit was:   Lab Results  Component Value Date   CHOL 140 01/03/2014   HDL 44 01/03/2014   LDLCALC 79 01/03/2014   TRIG 85 01/03/2014   CHOLHDL 3.2 01/03/2014   She has been working on diet and exercise for diabetes, and denies polydipsia, polyuria and visual disturbances. She was started on Metformin, she is on 2 pills a day and having nausea and stomach cramps.  Last A1C in the office was:  Lab Results  Component Value Date   HGBA1C 7.0* 01/03/2014   Patient is on Vitamin D supplement. She is on thyroid medication. Her medication was not changed last visit. Patient denies palpitations and weight changes.  Lab Results  Component Value Date   TSH 0.472 01/03/2014  .  Right knee pain- worse with weather changes. Across all of knee. Denies popping, clicking, weakness, giving way.  Patient going to [place] with her family for her and her husbands anniversary- will give travel meds.  Names of Other Physician/Practitioners you currently use: 1. [facility]- here for primary care 2. Dr. Delman Cheadle eye  doctor, last visit yearly 3. Dr. Wells Guiles, dentist, last visit 1-2 years.  Patient Care Team: [name], MD as PCP - General (Internal Medicine) Eston Esters, MD as Consulting Physician (Oncology) Rozetta Nunnery, MD as Consulting Physician (Otolaryngology) Mayme Genta, MD as Consulting Physician (Gastroenterology) [facility] Myrtice Lauth, MD as Consulting Physician (Dermatology)  Medication Review Current Outpatient Prescriptions on File Prior to Visit  Medication Sig Dispense Refill  . aspirin 81 MG tablet Take 81 mg by mouth daily.        . Calcium Carbonate-Vitamin D (CALCIUM 600 + D PO) Take by mouth 2 (two) times daily.       Marland Kitchen levothyroxine (SYNTHROID, LEVOTHROID) 88 MCG tablet Take 88 mcg by mouth daily. 1/2 daily      . metFORMIN (GLUCOPHAGE XR) 500 MG 24 hr tablet Take 1 tablet with breakfast and lunch and 2 tablets with supper for diabetes.  360 tablet  99  . Multiple Vitamins-Minerals (MULTIVITAMIN PO) Take by mouth.      . pravastatin (PRAVACHOL) 40 MG tablet Take 40 mg by mouth daily.      Marland Kitchen triamterene-hydrochlorothiazide (MAXZIDE) 75-50 MG per tablet Take 1 tablet by mouth daily.        Marland Kitchen VITAMIN D, ERGOCALCIFEROL, PO Take 4,000 Int'l Units by mouth daily.        No current facility-administered medications on file prior to visit.    Current Problems (verified) Patient Active Problem List   Diagnosis Date Noted  . Other abnormal glucose 01/03/2014  . Encounter for long-term (current) use of other medications 01/03/2014  . Breast cancer, left breast 12/19/2013  . Hypothyroid   . Hypertension   . Hyperlipidemia   . Vitamin D deficiency   . Iron deficiency anemia   . Malignant neoplasm of breast (female), unspecified site 12/05/2012    Screening Tests Health Maintenance  Topic Date Due  . Tetanus/tdap  06/24/1960  . Colonoscopy  06/25/1991  . Zostavax  06/24/2001  . Pneumococcal Polysaccharide Vaccine Age 28 And Over  06/24/2006  . Influenza Vaccine   06/23/2014  . Mammogram  11/07/2015     Immunization History  Administered Date(s) Administered  . Pneumococcal Conjugate-13 12/29/2011    Preventative care: Last colonoscopy: 2014 Dr. Earlean Shawl + polyps Last mammogram: 10/2013 Last pap smear/pelvic exam: 2011   DEXA: 2011- osteopenia- will order  Prior vaccinations: TD or Tdap: 2013  Influenza: declines  Pneumococcal: 2013 Shingles/Zostavax: declines  History reviewed: allergies, current medications, past family history, past medical history, past social history, past surgical history and problem list  Risk Factors: Osteoporosis: postmenopausal estrogen deficiency and dietary calcium and/or vitamin D deficiency History of fracture in the past year: no  Tobacco History  Substance Use Topics  . Smoking status: Never Smoker   . Smokeless tobacco: Not on file  . Alcohol Use: No   She does not smoke.  Patient is not a former smoker. Are there smokers in your home (other than you)?  No  Alcohol Current alcohol use: none  Caffeine Current caffeine use: coffee 2 /day  Exercise Exercise limitations: The patient has no exercise limitations. Current exercise: walking  Nutrition/Diet Current diet: in general, a "healthy" diet    Cardiac risk factors: advanced age (older than 73 for men, 18 for women), diabetes mellitus, dyslipidemia, hypertension, obesity (BMI >= 30 kg/m2) and sedentary lifestyle.  Depression Screen (Note: if answer to either of the following is "Yes", a more complete depression screening is indicated)   Q1: Over the past two weeks, have you felt down, depressed or hopeless? No  Q2: Over the past two weeks, have you felt little interest or pleasure in doing things? No  Have you lost interest or pleasure in daily life? No  Do you often feel hopeless? No  Do you cry easily over simple problems? No  Activities of Daily Living In your present state of health, do you have any difficulty performing the  following activities?:  Driving? No Managing money?  No Feeding yourself? No Getting from bed to chair? No Climbing a flight of stairs? No Preparing food and eating?: No Bathing or showering? No Getting dressed: No Getting to the toilet? No Using the toilet:No Moving around from place to place: No In the past year have you fallen or had a near fall?:No   Are you sexually active?  No  Do you have more than one partner?  No  Vision Difficulties: No  Hearing Difficulties: No Do you often ask people to speak up or repeat themselves? No Do you experience ringing or noises in your ears? No Do you have difficulty understanding soft or whispered voices? No  Cognition  Do you feel that you have a problem with memory?No  Do you often misplace items? No  Do you feel safe at home?  Yes  Advanced directives Does patient have a [place]? Yes Does patient have a Living Will? Yes   Objective:   Blood pressure 120/78, pulse 76, temperature 97.9 F (36.6 C), resp. rate 16, weight 159 lb (72.122 kg). Body mass index is 28.6 kg/(m^2).  General appearance: alert, no distress, WD/WN,  female Cognitive Testing  Alert? Yes  Normal Appearance?Yes  Oriented to person? Yes  Place? Yes   Time? Yes  Recall of three objects?  Yes  Can perform simple calculations? Yes  Displays appropriate judgment?Yes  Can read the correct time from a watch face?Yes  HEENT: normocephalic, sclerae anicteric, TMs pearly, nares patent, no discharge or erythema, pharynx normal Oral cavity: MMM, no lesions Neck: supple, no lymphadenopathy, no thyromegaly, no masses Heart: RRR, normal S1, S2, no murmurs Lungs: CTA bilaterally, no wheezes, rhonchi, or rales Abdomen: +bs, soft, non tender, non distended, no masses, no hepatomegaly, no splenomegaly Musculoskeletal: nontender, no swelling, no obvious deformity, right knee crepitus, non tender joint line, no effusion, no laxity.  Extremities: no  edema, no cyanosis, no clubbing Pulses: 2+ symmetric, upper and lower extremities, normal cap refill Neurological: alert, oriented x 3, CN2-12 intact, strength normal upper extremities and lower extremities, sensation normal throughout, DTRs 2+ throughout, no cerebellar signs, gait normal Psychiatric: normal affect, behavior normal, pleasant  Breast: defer Gyn: defer Rectal: defer  Medicare Attestation I have personally reviewed: The patient's medical and social history Their use of alcohol, tobacco or illicit drugs Their current medications and supplements The patient's functional ability including ADLs,fall risks, home safety risks, cognitive, and hearing and visual impairment Diet and physical activities Evidence for depression or mood disorders  The patient's weight, height, BMI, and visual acuity have been recorded in the chart.  I have made referrals, counseling, and provided education to the patient based on review of the above and I have provided the patient with a written personalized care plan for preventive services.     Tammy Mutters, PA-C   04/04/2014

## 2014-04-05 LAB — BASIC METABOLIC PANEL WITH GFR
BUN: 8 mg/dL (ref 6–23)
CHLORIDE: 92 meq/L — AB (ref 96–112)
CO2: 30 mEq/L (ref 19–32)
Calcium: 9.7 mg/dL (ref 8.4–10.5)
Creat: 0.64 mg/dL (ref 0.50–1.10)
GFR, EST NON AFRICAN AMERICAN: 89 mL/min
GFR, Est African American: >89 mL/min
Glucose, Bld: 88 mg/dL (ref 70–99)
Potassium: 4.1 mEq/L (ref 3.5–5.3)
Sodium: 136 mEq/L (ref 135–145)

## 2014-04-05 LAB — LIPID PANEL
Cholesterol: 153 mg/dL (ref 0–200)
HDL: 46 mg/dL (ref >39)
LDL Cholesterol: 85 mg/dL (ref 0–99)
Total CHOL/HDL Ratio: 3.3 Ratio
Triglycerides: 111 mg/dL (ref <150)
VLDL: 22 mg/dL (ref 0–40)

## 2014-04-05 LAB — HEMOGLOBIN A1C
HEMOGLOBIN A1C: 6.6 % — AB (ref <5.7)
MEAN PLASMA GLUCOSE: 143 mg/dL — AB (ref <117)

## 2014-04-05 LAB — INSULIN, FASTING: INSULIN FASTING, SERUM: 22 u[IU]/mL (ref 3–28)

## 2014-04-05 LAB — MAGNESIUM: MAGNESIUM: 1.8 mg/dL (ref 1.5–2.5)

## 2014-04-05 LAB — HEPATIC FUNCTION PANEL
ALBUMIN: 4.4 g/dL (ref 3.5–5.2)
ALT: 19 U/L (ref 0–35)
AST: 31 U/L (ref 0–37)
Alkaline Phosphatase: 91 U/L (ref 39–117)
Bilirubin, Direct: 0.1 mg/dL (ref 0.0–0.3)
Indirect Bilirubin: 0.5 mg/dL (ref 0.2–1.2)
TOTAL PROTEIN: 7 g/dL (ref 6.0–8.3)
Total Bilirubin: 0.6 mg/dL (ref 0.2–1.2)

## 2014-04-05 LAB — VITAMIN D 25 HYDROXY (VIT D DEFICIENCY, FRACTURES): VIT D 25 HYDROXY: 83 ng/mL (ref 30–89)

## 2014-04-05 LAB — TSH: TSH: 0.505 u[IU]/mL (ref 0.350–4.500)

## 2014-07-05 ENCOUNTER — Ambulatory Visit (INDEPENDENT_AMBULATORY_CARE_PROVIDER_SITE_OTHER): Payer: Medicare Other | Admitting: Internal Medicine

## 2014-07-05 ENCOUNTER — Encounter: Payer: Self-pay | Admitting: Internal Medicine

## 2014-07-05 VITALS — BP 120/82 | HR 88 | Temp 98.2°F | Resp 16 | Ht 62.5 in | Wt 158.0 lb

## 2014-07-05 DIAGNOSIS — E559 Vitamin D deficiency, unspecified: Secondary | ICD-10-CM

## 2014-07-05 DIAGNOSIS — E785 Hyperlipidemia, unspecified: Secondary | ICD-10-CM

## 2014-07-05 DIAGNOSIS — R7309 Other abnormal glucose: Secondary | ICD-10-CM

## 2014-07-05 DIAGNOSIS — I10 Essential (primary) hypertension: Secondary | ICD-10-CM

## 2014-07-05 DIAGNOSIS — Z79899 Other long term (current) drug therapy: Secondary | ICD-10-CM

## 2014-07-05 DIAGNOSIS — Z0000 Encounter for general adult medical examination without abnormal findings: Secondary | ICD-10-CM

## 2014-07-05 LAB — CBC WITH DIFFERENTIAL/PLATELET
Basophils Absolute: 0.1 10*3/uL (ref 0.0–0.1)
Basophils Relative: 1 % (ref 0–1)
Eosinophils Absolute: 0.1 10*3/uL (ref 0.0–0.7)
Eosinophils Relative: 1 % (ref 0–5)
HCT: 42.8 % (ref 36.0–46.0)
HEMOGLOBIN: 14.6 g/dL (ref 12.0–15.0)
LYMPHS ABS: 2.3 10*3/uL (ref 0.7–4.0)
LYMPHS PCT: 43 % (ref 12–46)
MCH: 26.1 pg (ref 26.0–34.0)
MCHC: 34.1 g/dL (ref 30.0–36.0)
MCV: 76.6 fL — ABNORMAL LOW (ref 78.0–100.0)
MONOS PCT: 7 % (ref 3–12)
Monocytes Absolute: 0.4 10*3/uL (ref 0.1–1.0)
NEUTROS ABS: 2.6 10*3/uL (ref 1.7–7.7)
NEUTROS PCT: 48 % (ref 43–77)
PLATELETS: 354 10*3/uL (ref 150–400)
RBC: 5.59 MIL/uL — AB (ref 3.87–5.11)
RDW: 15.2 % (ref 11.5–15.5)
WBC: 5.4 10*3/uL (ref 4.0–10.5)

## 2014-07-05 NOTE — Patient Instructions (Signed)

## 2014-07-05 NOTE — Progress Notes (Signed)
Patient ID: Tammy Hensley, female   DOB: 1941-11-21, 73 y.o.   MRN: [id number]   This very nice 73 y.o.[name] presents for 3 month follow up with Hypertension, Hyperlipidemia, Hypothyroidism,  Pre-Diabetes and Vitamin D Deficiency.    Patient is treated for HTN & BP has been controlled at home. Today's BP: 120/82 mmHg. Patient denies any cardiac type chest pain, palpitations, dyspnea/orthopnea/PND, dizziness, claudication, or dependent edema.   Hyperlipidemia is controlled with diet & meds. Patient denies myalgias or other med SE's. 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  Last A1c was 6.6% on  04/04/2014.    Further, Patient has history of Vitamin D Deficiency and patient supplements vitamin D without any suspected side-effects. Last vitamin D was  83 on  04/04/2014   Medication List   aspirin 81 MG tablet  Take 81 mg by mouth daily.     CALCIUM 600 + D PO  Take by mouth 2 (two) times daily.     levothyroxine 88 MCG tablet  Commonly known as:  SYNTHROID, LEVOTHROID  Take 88 mcg by mouth daily. 1/2 daily     meloxicam 15 MG tablet  Commonly known as:  MOBIC  Can take 1/2-1 pill as needed for pain WITH FOOD     metFORMIN 500 MG 24 hr tablet  Commonly known as:  GLUCOPHAGE XR  Take 1 tablet with breakfast and lunch and 2 tablets with supper for diabetes.     MULTIVITAMIN PO  Take by mouth.     pravastatin 40 MG tablet  Commonly known as:  PRAVACHOL  Take 40 mg by mouth daily.     triamterene-hydrochlorothiazide 75-50 MG per tablet  Commonly known as:  MAXZIDE  Take 1 tablet by mouth daily.     VITAMIN D (ERGOCALCIFEROL) PO  Take 4,000 Int'l Units by mouth daily.     Allergies  Allergen Reactions  . Calan [Verapamil]    Constipation   . Effexor [Venlafaxine]   . Erythromycin   . Femara [Letrozole]   . Fosamax [Alendronate]     aching  . Ibuprofen   . Paxil [Paroxetine Hcl] Nausea Only  . Remeron [Mirtazapine]     Weight gain   . Tamoxifen     Hair loss @ low dose  . Vasotec [Enalapril] Cough   PMHx:   Past Medical History  Diagnosis Date  . Thyroid disease   . Hypertension   . Hyperlipidemia   . Diabetes mellitus without complication   . Hypothyroid   . Depression   . Cancer     left breast  . Vitamin D deficiency   . Iron deficiency anemia   . Allergy   . Insomnia    FHx:    Reviewed / unchanged SHx:    Reviewed / unchanged  Systems Review:  Constitutional: Denies fever, chills, wt changes, headaches, insomnia, fatigue, night sweats, change in appetite. Eyes: Denies redness, blurred vision, diplopia, discharge, itchy, watery eyes.  ENT: Denies discharge, congestion, post nasal drip, epistaxis, sore throat, earache, hearing loss, dental pain, tinnitus, vertigo, sinus pain, snoring.  CV: Denies chest pain, palpitations, irregular heartbeat, syncope, dyspnea, diaphoresis, orthopnea, PND, claudication or edema. Respiratory: denies cough, dyspnea, DOE, pleurisy, hoarseness, laryngitis, wheezing.  Gastrointestinal: Denies dysphagia, odynophagia,  heartburn, reflux, water brash, abdominal pain or cramps, nausea, vomiting, bloating, diarrhea, constipation, hematemesis, melena, hematochezia  or hemorrhoids. Genitourinary: Denies dysuria, frequency, urgency, nocturia, hesitancy, discharge, hematuria or flank pain. Musculoskeletal: Denies arthralgias, myalgias, stiffness, jt. swelling, pain, limping or strain/sprain.  Skin: Denies pruritus, rash, hives, warts, acne, eczema or change in skin lesion(s). Neuro: No weakness, tremor, incoordination, spasms, paresthesia or pain. Psychiatric: Denies confusion, memory loss or sensory loss. Endo: Denies change in weight, skin or hair change.  Heme/Lymph:  No excessive bleeding, bruising or enlarged lymph nodes.  Exam:  BP 120/82  Pulse 88  Temp(Src) 98.2 F (36.8 C) (Temporal)  Resp 16  Ht 5' 2.5" (1.588 m)  Wt 158 lb (71.668 kg)  BMI 28.42 kg/m2  Appears well nourished and in no distress. Eyes: PERRLA, EOMs, conjunctiva no swelling or erythema. Sinuses: No frontal/maxillary tenderness ENT/Mouth: EAC's clear, TM's nl w/o erythema, bulging. Nares clear w/o erythema, swelling, exudates. Oropharynx clear without erythema or exudates. Oral hygiene is good. Tongue normal, non obstructing. Hearing intact.  Neck: Supple. Thyroid nl. Car 2+/2+ without bruits, nodes or JVD. Chest: Respirations nl with BS clear & equal w/o rales, rhonchi, wheezing or stridor.  Cor: Heart sounds normal w/ regular rate and rhythm without sig. murmurs, gallops, clicks, or rubs. Peripheral pulses normal and equal  without edema.  Abdomen: Soft & bowel sounds normal. Non-tender w/o guarding, rebound, hernias, masses, or organomegaly.  Lymphatics: Unremarkable.  Musculoskeletal: Full ROM all peripheral extremities, joint stability, 5/5 strength, and normal gait.  Skin: Warm, dry without exposed rashes, lesions or ecchymosis apparent.  Neuro: Cranial nerves intact, reflexes equal bilaterally. Sensory-motor testing grossly intact. Tendon reflexes grossly intact.  Pysch: Alert & oriented x 3. Insight and judgement nl & appropriate. No ideations.  Assessment and Plan:  1. Hypertension - Continue monitor blood pressure at home. Continue diet/meds same.  2. Hyperlipidemia - Continue diet/meds, exercise,& lifestyle modifications. Continue monitor periodic cholesterol/liver & renal functions   3. Pre-Diabetes - Continue diet, exercise, lifestyle modifications. Monitor appropriate labs.  3. T2_NIDDM w/Stage 2 CKD    4. Vitamin D Deficiency - Continue supplementation.  5. Hypothyroidism  Recommended regular exercise, BP monitoring, weight control, and discussed med and  SE's. Recommended labs to assess and monitor clinical status. Further disposition pending results of labs.

## 2014-07-06 LAB — BASIC METABOLIC PANEL WITH GFR
BUN: 9 mg/dL (ref 6–23)
CHLORIDE: 92 meq/L — AB (ref 96–112)
CO2: 30 meq/L (ref 19–32)
Calcium: 9.8 mg/dL (ref 8.4–10.5)
Creat: 0.75 mg/dL (ref 0.50–1.10)
GFR, Est African American: >89 mL/min
GFR, Est Non African American: 79 mL/min
GLUCOSE: 108 mg/dL — AB (ref 70–99)
POTASSIUM: 4 meq/L (ref 3.5–5.3)
SODIUM: 135 meq/L (ref 135–145)

## 2014-07-06 LAB — HEMOGLOBIN A1C
Hgb A1c MFr Bld: 6.9 % — ABNORMAL HIGH (ref <5.7)
Mean Plasma Glucose: 151 mg/dL — ABNORMAL HIGH (ref <117)

## 2014-07-06 LAB — LIPID PANEL
Cholesterol: 154 mg/dL (ref 0–200)
HDL: 49 mg/dL (ref >39)
LDL Cholesterol: 80 mg/dL (ref 0–99)
Total CHOL/HDL Ratio: 3.1 Ratio
Triglycerides: 127 mg/dL (ref <150)
VLDL: 25 mg/dL (ref 0–40)

## 2014-07-06 LAB — HEPATIC FUNCTION PANEL
ALBUMIN: 4.4 g/dL (ref 3.5–5.2)
ALK PHOS: 89 U/L (ref 39–117)
ALT: 20 U/L (ref 0–35)
AST: 28 U/L (ref 0–37)
BILIRUBIN TOTAL: 0.7 mg/dL (ref 0.2–1.2)
Bilirubin, Direct: 0.1 mg/dL (ref 0.0–0.3)
Indirect Bilirubin: 0.6 mg/dL (ref 0.2–1.2)
TOTAL PROTEIN: 7.2 g/dL (ref 6.0–8.3)

## 2014-07-06 LAB — TSH: TSH: 0.663 u[IU]/mL (ref 0.350–4.500)

## 2014-07-06 LAB — VITAMIN D 25 HYDROXY (VIT D DEFICIENCY, FRACTURES): Vit D, 25-Hydroxy: 116 ng/mL — ABNORMAL HIGH (ref 30–89)

## 2014-07-06 LAB — MAGNESIUM: MAGNESIUM: 2 mg/dL (ref 1.5–2.5)

## 2014-07-06 LAB — INSULIN, FASTING: INSULIN FASTING, SERUM: 78 u[IU]/mL — AB (ref 3–28)

## 2014-07-26 ENCOUNTER — Other Ambulatory Visit: Payer: Self-pay | Admitting: Emergency Medicine

## 2014-10-01 ENCOUNTER — Other Ambulatory Visit: Payer: Self-pay

## 2014-10-01 DIAGNOSIS — Z1231 Encounter for screening mammogram for malignant neoplasm of breast: Secondary | ICD-10-CM

## 2014-10-30 ENCOUNTER — Encounter: Payer: Self-pay | Admitting: Physician Assistant

## 2014-10-30 ENCOUNTER — Ambulatory Visit (INDEPENDENT_AMBULATORY_CARE_PROVIDER_SITE_OTHER): Payer: Medicare Other | Admitting: Physician Assistant

## 2014-10-30 VITALS — BP 138/80 | HR 76 | Temp 97.9°F | Resp 16 | Wt 155.0 lb

## 2014-10-30 DIAGNOSIS — E559 Vitamin D deficiency, unspecified: Secondary | ICD-10-CM

## 2014-10-30 DIAGNOSIS — M545 Low back pain: Secondary | ICD-10-CM

## 2014-10-30 DIAGNOSIS — R7303 Prediabetes: Secondary | ICD-10-CM

## 2014-10-30 DIAGNOSIS — I10 Essential (primary) hypertension: Secondary | ICD-10-CM

## 2014-10-30 DIAGNOSIS — Z79899 Other long term (current) drug therapy: Secondary | ICD-10-CM

## 2014-10-30 DIAGNOSIS — E785 Hyperlipidemia, unspecified: Secondary | ICD-10-CM

## 2014-10-30 DIAGNOSIS — R32 Unspecified urinary incontinence: Secondary | ICD-10-CM

## 2014-10-30 DIAGNOSIS — E039 Hypothyroidism, unspecified: Secondary | ICD-10-CM

## 2014-10-30 LAB — BASIC METABOLIC PANEL WITH GFR
BUN: 11 mg/dL (ref 6–23)
CALCIUM: 10.2 mg/dL (ref 8.4–10.5)
CO2: 29 meq/L (ref 19–32)
CREATININE: 0.69 mg/dL (ref 0.50–1.10)
Chloride: 97 mEq/L (ref 96–112)
GFR, Est Non African American: 87 mL/min
Glucose, Bld: 83 mg/dL (ref 70–99)
Potassium: 4 mEq/L (ref 3.5–5.3)
Sodium: 137 mEq/L (ref 135–145)

## 2014-10-30 LAB — CBC WITH DIFFERENTIAL/PLATELET
Basophils Absolute: 0 10*3/uL (ref 0.0–0.1)
Basophils Relative: 0 % (ref 0–1)
EOS ABS: 0.1 10*3/uL (ref 0.0–0.7)
EOS PCT: 1 % (ref 0–5)
HCT: 41.3 % (ref 36.0–46.0)
HEMOGLOBIN: 13.6 g/dL (ref 12.0–15.0)
LYMPHS ABS: 2.5 10*3/uL (ref 0.7–4.0)
Lymphocytes Relative: 44 % (ref 12–46)
MCH: 25.6 pg — AB (ref 26.0–34.0)
MCHC: 32.9 g/dL (ref 30.0–36.0)
MCV: 77.8 fL — ABNORMAL LOW (ref 78.0–100.0)
MPV: 9.9 fL (ref 9.4–12.4)
Monocytes Absolute: 0.4 10*3/uL (ref 0.1–1.0)
Monocytes Relative: 8 % (ref 3–12)
Neutro Abs: 2.6 10*3/uL (ref 1.7–7.7)
Neutrophils Relative %: 47 % (ref 43–77)
Platelets: 365 10*3/uL (ref 150–400)
RBC: 5.31 MIL/uL — ABNORMAL HIGH (ref 3.87–5.11)
RDW: 14.4 % (ref 11.5–15.5)
WBC: 5.6 10*3/uL (ref 4.0–10.5)

## 2014-10-30 LAB — HEPATIC FUNCTION PANEL
ALT: 17 U/L (ref 0–35)
AST: 26 U/L (ref 0–37)
Albumin: 4.3 g/dL (ref 3.5–5.2)
Alkaline Phosphatase: 82 U/L (ref 39–117)
Bilirubin, Direct: 0.1 mg/dL (ref 0.0–0.3)
Indirect Bilirubin: 0.5 mg/dL (ref 0.2–1.2)
TOTAL PROTEIN: 7.4 g/dL (ref 6.0–8.3)
Total Bilirubin: 0.6 mg/dL (ref 0.2–1.2)

## 2014-10-30 LAB — MAGNESIUM: Magnesium: 2.1 mg/dL (ref 1.5–2.5)

## 2014-10-30 LAB — LIPID PANEL
CHOLESTEROL: 179 mg/dL (ref 0–200)
HDL: 46 mg/dL (ref >39)
LDL Cholesterol: 109 mg/dL — ABNORMAL HIGH (ref 0–99)
TRIGLYCERIDES: 119 mg/dL (ref <150)
Total CHOL/HDL Ratio: 3.9 Ratio
VLDL: 24 mg/dL (ref 0–40)

## 2014-10-30 LAB — TSH: TSH: 0.509 u[IU]/mL (ref 0.350–4.500)

## 2014-10-30 MED ORDER — LEVOTHYROXINE SODIUM 88 MCG PO TABS: 88.0000 ug | ORAL_TABLET | Freq: Every day | ORAL | Status: DC

## 2014-10-30 NOTE — Patient Instructions (Signed)
Lumbosacral Strain Lumbosacral strain is a strain of any of the parts that make up your lumbosacral vertebrae. Your lumbosacral vertebrae are the bones that make up the lower third of your backbone. Your lumbosacral vertebrae are held together by muscles and tough, fibrous tissue (ligaments).  CAUSES  A sudden blow to your back can cause lumbosacral strain. Also, anything that causes an excessive stretch of the muscles in the low back can cause this strain. This is typically seen when people exert themselves strenuously, fall, lift heavy objects, bend, or crouch repeatedly. RISK FACTORS  Physically demanding work.  Participation in pushing or pulling sports or sports that require a sudden twist of the back (tennis, golf, baseball).  Weight lifting.  Excessive lower back curvature.  Forward-tilted pelvis.  Weak back or abdominal muscles or both.  Tight hamstrings. SIGNS AND SYMPTOMS  Lumbosacral strain may cause pain in the area of your injury or pain that moves (radiates) down your leg.  DIAGNOSIS Your health care provider can often diagnose lumbosacral strain through a physical exam. In some cases, you may need tests such as X-ray exams.  TREATMENT  Treatment for your lower back injury depends on many factors that your clinician will have to evaluate. However, most treatment will include the use of anti-inflammatory medicines. HOME CARE INSTRUCTIONS   Avoid hard physical activities (tennis, racquetball, waterskiing) if you are not in proper physical condition for it. This may aggravate or create problems.  If you have a back problem, avoid sports requiring sudden body movements. Swimming and walking are generally safer activities.  Maintain good posture.  Maintain a healthy weight.  For acute conditions, you may put ice on the injured area.  Put ice in a plastic bag.  Place a towel between your skin and the bag.  Leave the ice on for 20 minutes, 2-3 times a day.  When the  low back starts healing, stretching and strengthening exercises may be recommended. SEEK MEDICAL CARE IF:  Your back pain is getting worse.  You experience severe back pain not relieved with medicines. SEEK IMMEDIATE MEDICAL CARE IF:   You have numbness, tingling, weakness, or problems with the use of your arms or legs.  There is a change in bowel or bladder control.  You have increasing pain in any area of the body, including your belly (abdomen).  You notice shortness of breath, dizziness, or feel faint.  You feel sick to your stomach (nauseous), are throwing up (vomiting), or become sweaty.  You notice discoloration of your toes or legs, or your feet get very cold. MAKE SURE YOU:   Understand these instructions.  Will watch your condition.  Will get help right away if you are not doing well or get worse. Document Released: 08/19/2005 Document Revised: 11/14/2013 Document Reviewed: 06/28/2013 [facility] Patient Information 2015 [place], [place]. This information is not intended to replace advice given to you by your health care provider. Make sure you discuss any questions you have with your health care provider.   Urinary Incontinence Urinary incontinence is the involuntary loss of urine from your bladder. CAUSES  There are many causes of urinary incontinence. They include:  Medicines.  Infections.  Prostatic enlargement, leading to overflow of urine from your bladder.  Surgery.  Neurological diseases.  Emotional factors. SIGNS AND SYMPTOMS Urinary Incontinence can be divided into four types:  Urge incontinence. Urge incontinence is the involuntary loss of urine before you have the opportunity to go to the bathroom. There is a sudden urge  to void but not enough time to reach a bathroom.  Stress incontinence. Stress incontinence is the sudden loss of urine with any activity that forces urine to pass. It is commonly caused by anatomical changes to the pelvis and  sphincter areas of your body.  Overflow incontinence. Overflow incontinence is the loss of urine from an obstructed opening to your bladder. This results in a backup of urine and a resultant buildup of pressure within the bladder. When the pressure within the bladder exceeds the closing pressure of the sphincter, the urine overflows, which causes incontinence, similar to water overflowing a dam.  Total incontinence. Total incontinence is the loss of urine as a result of the inability to store urine within your bladder. DIAGNOSIS  Evaluating the cause of incontinence may require:  A thorough and complete medical and obstetric history.  A complete physical exam.  Laboratory tests such as a urine culture and sensitivities. When additional tests are indicated, they can include:  An ultrasound exam.  Kidney and bladder X-rays.  Cystoscopy. This is an exam of the bladder using a narrow scope.  Urodynamic testing to test the nerve function to the bladder and sphincter areas. TREATMENT  Treatment for urinary incontinence depends on the cause:  For urge incontinence caused by a bacterial infection, antibiotics will be prescribed. If the urge incontinence is related to medicines you take, your health care provider may have you change the medicine.  For stress incontinence, surgery to re-establish anatomical support to the bladder or sphincter, or both, will often correct the condition.  For overflow incontinence caused by an enlarged prostate, an operation to open the channel through the enlarged prostate will allow the flow of urine out of the bladder. In women with fibroids, a hysterectomy may be recommended.  For total incontinence, surgery on your urinary sphincter may help. An artificial urinary sphincter (an inflatable cuff placed around the urethra) may be required. In women who have developed a hole-like passage between their bladder and vagina (vesicovaginal fistula), surgery to close  the fistula often is required. HOME CARE INSTRUCTIONS  Normal daily hygiene and the use of pads or adult diapers that are changed regularly will help prevent odors and skin damage.  Avoid caffeine. It can overstimulate your bladder.  Use the bathroom regularly. Try about every 2-3 hours to go to the bathroom, even if you do not feel the need to do so. Take time to empty your bladder completely. After urinating, wait a minute. Then try to urinate again.  For causes involving nerve dysfunction, keep a log of the medicines you take and a journal of the times you go to the bathroom. SEEK MEDICAL CARE IF:  You experience worsening of pain instead of improvement in pain after your procedure.  Your incontinence becomes worse instead of better. SEE IMMEDIATE MEDICAL CARE IF:  You experience fever or shaking chills.  You are unable to pass your urine.  You have redness spreading into your groin or down into your thighs. MAKE SURE YOU:   Understand these instructions.   Will watch your condition.  Will get help right away if you are not doing well or get worse. Document Released: 12/17/2004 Document Revised: 08/30/2013 Document Reviewed: 04/18/2013 [facility] Patient Information 2015 [place], [place]. This information is not intended to replace advice given to you by your health care provider. Make sure you discuss any questions you have with your health care provider.

## 2014-10-30 NOTE — Progress Notes (Signed)
Assessment and Plan:  Hypertension: Continue medication, monitor blood pressure at home. Continue DASH diet.  Reminder to go to the ER if any CP, SOB, nausea, dizziness, severe HA, changes vision/speech, left arm numbness and tingling, and jaw pain. Cholesterol: Continue diet and exercise. Check cholesterol.  Diabetes without-Continue diet and exercise. Check A1C Vitamin D Def- check level and continue medications.  Hypothyroidism-check TSH level, continue medications the same, reminded to take on an empty stomach 30-66mins before food.  Left lower back pain- normal neuro exam- try the meloxicam with food, continue RICE Stress incontinence- pelvic floor muscles exercises given, will check urine for infection  Continue diet and meds as discussed. Further disposition pending results of labs.  HPI 73 y.o. female  presents for 3 month follow up with hypertension, hyperlipidemia, diabetes and vitamin D. Her blood pressure has been controlled at home, today their BP is BP: 138/80 mmHg She does workout. She denies chest pain, shortness of breath, dizziness.  She is on cholesterol medication, pravastatin 40mg  and denies myalgias. Her cholesterol is at goal. The cholesterol was:  07/05/2014: Cholesterol, Total 154; HDL Cholesterol by NMR 49; LDL (calc) 80; Triglycerides 127 She has been working on diet and exercise for Diabetes without complications, she is on bASA, she is on ACE/ARB, and denies polydipsia, polyuria and visual disturbances. Last A1C was: 07/05/2014: Hemoglobin-A1c 6.9* Patient is on Vitamin D supplement. 07/05/2014: Vit D, 25-Hydroxy 116* was decreased to 2000 IU 3 days a week and 4000 IU 4 x a week.  She is on thyroid medication. Her medication was not changed last visit. Patient denies fatigue, weight changes, heat/cold intolerance, bowel/skin changes or CVS symptoms  Lab Results  Component Value Date   TSH 0.663 07/05/2014  .  She also complains of left back pain, buttocks pain,  anterior hip with new onset incontinence with stress like coughing. Denies numbness/tingling in her feet. Has been using heating pad and OTC pain cream which has been helping. Denies dysuria, burning when she pees.  Current Medications:  Current Outpatient Prescriptions on File Prior to Visit  Medication Sig Dispense Refill  . aspirin 81 MG tablet Take 81 mg by mouth daily.      . Calcium Carbonate-Vitamin D (CALCIUM 600 + D PO) Take by mouth 2 (two) times daily.     Marland Kitchen levothyroxine (SYNTHROID, LEVOTHROID) 88 MCG tablet Take 88 mcg by mouth daily. 1/2 daily    . meloxicam (MOBIC) 15 MG tablet Can take 1/2-1 pill as needed for pain WITH FOOD 30 tablet 3  . metFORMIN (GLUCOPHAGE XR) 500 MG 24 hr tablet Take 1 tablet with breakfast and lunch and 2 tablets with supper for diabetes. 360 tablet 99  . Multiple Vitamins-Minerals (MULTIVITAMIN PO) Take by mouth.    . pravastatin (PRAVACHOL) 40 MG tablet Take 40 mg by mouth daily.    Marland Kitchen triamterene-hydrochlorothiazide (MAXZIDE) 75-50 MG per tablet TAKE ONE TABLET BY MOUTH ONCE DAILY FOR BLOOD PRESSURE AND  FLUID 90 tablet 0  . VITAMIN D, ERGOCALCIFEROL, PO Take 4,000 Int'l Units by mouth daily.      No current facility-administered medications on file prior to visit.   Medical History:  Past Medical History  Diagnosis Date  . Thyroid disease   . Hypertension   . Hyperlipidemia   . Diabetes mellitus without complication   . Hypothyroid   . Depression   . Cancer     left breast  . Vitamin D deficiency   . Iron deficiency anemia   .  Allergy   . Insomnia    Allergies:  Allergies  Allergen Reactions  . Calan [Verapamil]     Constipation   . Effexor [Venlafaxine]   . Erythromycin   . Femara [Letrozole]   . Fosamax [Alendronate]     aching  . Ibuprofen   . Paxil [Paroxetine Hcl] Nausea Only  . Remeron [Mirtazapine]     Weight gain   . Tamoxifen     Hair loss @ low dose  . Vasotec [Enalapril] Cough     Review of Systems:  Review of  Systems  Constitutional: Negative.   HENT: Negative.   Eyes: Negative.   Respiratory: Negative.   Cardiovascular: Negative.   Gastrointestinal: Negative.   Genitourinary: Positive for frequency. Negative for dysuria, urgency, hematuria and flank pain.       Incontinence with stress  Musculoskeletal: Positive for back pain (left lower back pain with some readiation to left lower quadrant and to butt, no radiation down leg). Negative for myalgias, joint pain, falls and neck pain.  Skin: Negative.  Negative for rash.  Neurological: Negative.  Negative for tingling (no numbness, tingling, saddle anesthsia) and focal weakness.  Endo/Heme/Allergies: Negative.   Psychiatric/Behavioral: Negative.     Family history- Review and unchanged Social history- Review and unchanged Physical Exam: BP 138/80 mmHg  Pulse 76  Temp(Src) 97.9 F (36.6 C)  Resp 16  Wt 155 lb (70.308 kg) Wt Readings from Last 3 Encounters:  10/30/14 155 lb (70.308 kg)  07/05/14 158 lb (71.668 kg)  04/04/14 159 lb (72.122 kg)   General Appearance: Well nourished, in no apparent distress. Eyes: PERRLA, EOMs, conjunctiva no swelling or erythema Sinuses: No Frontal/maxillary tenderness ENT/Mouth: Ext aud canals clear, TMs without erythema, bulging. No erythema, swelling, or exudate on post pharynx.  Tonsils not swollen or erythematous. Hearing normal.  Neck: Supple, thyroid normal.  Respiratory: Respiratory effort normal, BS equal bilaterally without rales, rhonchi, wheezing or stridor.  Cardio: RRR with no MRGs. Brisk peripheral pulses without edema.  Abdomen: Soft, + BS.  Non tender, no guarding, rebound, hernias, masses. Lymphatics: Non tender without lymphadenopathy.  Musculoskeletal: Full ROM, 5/5 strength, normal gait. Patient is able to ambulate well.   Gait is  antalgic. Straight leg raising with dorsiflexion negative bilaterally for radicular symptoms. Sensory exam in the legs are normal.  Knee reflexes are  normal Ankle reflexes are normal Strength is normal and symmetric in arms and legs. There isparaspinal muscle spasm.   There is not midline tenderness.   ROM of spine with normal flexion, extension, lateral range of motion to the right and left, and rotation to the right and left.   Skin: Warm, dry without rashes, lesions, ecchymosis.  Neuro: Cranial nerves intact. Normal muscle tone, no cerebellar symptoms. Sensation intact.  Psych: Awake and oriented X 3, normal affect, Insight and Judgment appropriate.    Vicie Mutters, PA-C 1:45 PM [facility]

## 2014-10-31 LAB — URINALYSIS, ROUTINE W REFLEX MICROSCOPIC
Bilirubin Urine: NEGATIVE
Glucose, UA: NEGATIVE mg/dL
Hgb urine dipstick: NEGATIVE
KETONES UR: NEGATIVE mg/dL
Leukocytes, UA: NEGATIVE
NITRITE: NEGATIVE
Protein, ur: NEGATIVE mg/dL
Specific Gravity, Urine: 1.009 (ref 1.005–1.030)
UROBILINOGEN UA: 0.2 mg/dL (ref 0.0–1.0)
pH: 7.5 (ref 5.0–8.0)

## 2014-10-31 LAB — VITAMIN D 25 HYDROXY (VIT D DEFICIENCY, FRACTURES): VIT D 25 HYDROXY: 75 ng/mL (ref 30–100)

## 2014-10-31 LAB — HEMOGLOBIN A1C
Hgb A1c MFr Bld: 6.5 % — ABNORMAL HIGH (ref <5.7)
MEAN PLASMA GLUCOSE: 140 mg/dL — AB (ref <117)

## 2014-11-01 LAB — URINE CULTURE: Colony Count: 6000

## 2014-11-07 ENCOUNTER — Ambulatory Visit
Admission: RE | Admit: 2014-11-07 | Discharge: 2014-11-07 | Disposition: A | Discharge status: Home / Self Care | Payer: Medicare Other | Source: Ambulatory Visit

## 2014-11-07 DIAGNOSIS — Z1231 Encounter for screening mammogram for malignant neoplasm of breast: Secondary | ICD-10-CM

## 2014-11-21 ENCOUNTER — Telehealth: Payer: Self-pay | Admitting: Oncology

## 2014-11-21 NOTE — Telephone Encounter (Signed)
S/w pt advised appt chg from 2/2 to 2/25 lab and 3/3 md. Pt verbalized understanding.

## 2014-12-18 ENCOUNTER — Other Ambulatory Visit: Payer: Medicare Other

## 2014-12-25 ENCOUNTER — Ambulatory Visit: Payer: Medicare Other | Admitting: Oncology

## 2015-01-03 ENCOUNTER — Encounter: Payer: Self-pay | Admitting: Internal Medicine

## 2015-01-16 ENCOUNTER — Other Ambulatory Visit: Payer: Self-pay | Admitting: *Deleted

## 2015-01-16 DIAGNOSIS — C50919 Malignant neoplasm of unspecified site of unspecified female breast: Secondary | ICD-10-CM

## 2015-01-17 ENCOUNTER — Other Ambulatory Visit (HOSPITAL_BASED_OUTPATIENT_CLINIC_OR_DEPARTMENT_OTHER): Payer: Medicare Other

## 2015-01-17 DIAGNOSIS — C50919 Malignant neoplasm of unspecified site of unspecified female breast: Secondary | ICD-10-CM

## 2015-01-17 DIAGNOSIS — Z853 Personal history of malignant neoplasm of breast: Secondary | ICD-10-CM

## 2015-01-17 LAB — COMPREHENSIVE METABOLIC PANEL (CC13)
ALBUMIN: 3.9 g/dL (ref 3.5–5.0)
ALT: 12 U/L (ref 0–55)
AST: 20 U/L (ref 5–34)
Alkaline Phosphatase: 84 U/L (ref 40–150)
Anion Gap: 10 mEq/L (ref 3–11)
BILIRUBIN TOTAL: 0.43 mg/dL (ref 0.20–1.20)
BUN: 11.2 mg/dL (ref 7.0–26.0)
CO2: 28 mEq/L (ref 22–29)
Calcium: 9.9 mg/dL (ref 8.4–10.4)
Chloride: 102 mEq/L (ref 98–109)
Creatinine: 0.8 mg/dL (ref 0.6–1.1)
EGFR: 88 mL/min/{1.73_m2} — ABNORMAL LOW (ref >90)
Glucose: 71 mg/dl (ref 70–140)
POTASSIUM: 3.9 meq/L (ref 3.5–5.1)
SODIUM: 140 meq/L (ref 136–145)
TOTAL PROTEIN: 7.2 g/dL (ref 6.4–8.3)

## 2015-01-17 LAB — CBC WITH DIFFERENTIAL/PLATELET
BASO%: 0.5 % (ref 0.0–2.0)
Basophils Absolute: 0 10*3/uL (ref 0.0–0.1)
EOS ABS: 0.1 10*3/uL (ref 0.0–0.5)
EOS%: 0.9 % (ref 0.0–7.0)
HEMATOCRIT: 41.5 % (ref 34.8–46.6)
HGB: 14 g/dL (ref 11.6–15.9)
LYMPH%: 42.1 % (ref 14.0–49.7)
MCH: 27 pg (ref 25.1–34.0)
MCHC: 33.7 g/dL (ref 31.5–36.0)
MCV: 80 fL (ref 79.5–101.0)
MONO#: 0.5 10*3/uL (ref 0.1–0.9)
MONO%: 8.7 % (ref 0.0–14.0)
NEUT%: 47.8 % (ref 38.4–76.8)
NEUTROS ABS: 2.6 10*3/uL (ref 1.5–6.5)
PLATELETS: 306 10*3/uL (ref 145–400)
RBC: 5.19 10*6/uL (ref 3.70–5.45)
RDW: 14.4 % (ref 11.2–14.5)
WBC: 5.5 10*3/uL (ref 3.9–10.3)
lymph#: 2.3 10*3/uL (ref 0.9–3.3)

## 2015-01-24 ENCOUNTER — Ambulatory Visit (HOSPITAL_BASED_OUTPATIENT_CLINIC_OR_DEPARTMENT_OTHER): Payer: Medicare Other | Admitting: Oncology

## 2015-01-24 DIAGNOSIS — I10 Essential (primary) hypertension: Secondary | ICD-10-CM

## 2015-01-24 DIAGNOSIS — Z853 Personal history of malignant neoplasm of breast: Secondary | ICD-10-CM

## 2015-01-24 DIAGNOSIS — E038 Other specified hypothyroidism: Secondary | ICD-10-CM

## 2015-01-24 DIAGNOSIS — C50912 Malignant neoplasm of unspecified site of left female breast: Secondary | ICD-10-CM

## 2015-01-24 DIAGNOSIS — E785 Hyperlipidemia, unspecified: Secondary | ICD-10-CM

## 2015-01-24 NOTE — Progress Notes (Signed)
Hematology and Oncology Follow Up Visit  Tammy Hensley [id number] [birth year] 74 y.o. 01/24/2015   PCP: Alesia Richards, MD GYN: SU:  OTHER MD:  HPI: Tammy Hensley with a history of a multifocal infiltrating lobular carcinoma for which she underwent a left lumpectomy with sentinel node dissection on 09/14/2005. Tumor was ER 92%/PR 86% positive HER-2 negative with a Ki-67 of 6%. She completed radiation therapy on 01/28/2006. She then completed 5 years worth of tamoxifen 20 mg by mouth daily on 03/25/2011.  Interim History:   Tammy Hensley returns today for followup of her remote breast cancer. The interval history is unremarkable. She is now "retired retired". She did no part-time work this past year. 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 She is exercising regularly, chiefly by walking, which she does at least every other day.  Review of Systems: She denies unusual headaches, visual changes, nausea, vomiting, dizziness, or gait imbalance. She denies cough, phlegm production, pleurisy, chest pain or pressure, or palpitations. There have been no change in bowel or bladder habits. A detailed review of systems today was otherwise entirely negative   Medications:   Current Outpatient Prescriptions  Medication Sig Dispense Refill  . aspirin 81 MG tablet Take 81 mg by mouth daily.      . Calcium Carbonate-Vitamin D (CALCIUM 600 + D PO) Take by mouth 2 (two) times daily.     Marland Kitchen levothyroxine (SYNTHROID, LEVOTHROID) 88 MCG tablet Take 1 tablet (88 mcg total) by mouth daily before breakfast. 90 tablet 1  . meloxicam (MOBIC) 15 MG tablet Can take 1/2-1 pill as needed for pain WITH FOOD 30 tablet 3  . metFORMIN (GLUCOPHAGE XR) 500 MG 24 hr tablet Take 1 tablet with breakfast and lunch and 2 tablets with supper for diabetes. 360 tablet 99  . Multiple Vitamins-Minerals (MULTIVITAMIN PO) Take by mouth.    . pravastatin  (PRAVACHOL) 40 MG tablet Take 40 mg by mouth daily.    Marland Kitchen triamterene-hydrochlorothiazide (MAXZIDE) 75-50 MG per tablet TAKE ONE TABLET BY MOUTH ONCE DAILY FOR BLOOD PRESSURE AND  FLUID 90 tablet 0  . VITAMIN D, ERGOCALCIFEROL, PO Take 4,000 Int'l Units by mouth daily.      No current facility-administered medications for this visit.   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 At home it's just her and her husband. She has 2 children and 2 grandchildren.  Allergies:  Allergies  Allergen Reactions  . Calan [Verapamil]     Constipation   . Effexor [Venlafaxine]   . Erythromycin   . Femara [Letrozole]   . Fosamax [Alendronate]     aching  . Ibuprofen   . Paxil [Paroxetine Hcl] Nausea Only  . Remeron [Mirtazapine]     Weight gain   . Tamoxifen     Hair loss @ low dose  . Vasotec [Enalapril] Cough    Physical Exam: Middle-aged African American Hensley who appears younger than stated age There were no vitals filed for this visit. Sclerae unicteric, pupils round and equal Oropharynx clear, dentition in good repair No cervical or supraclavicular adenopathy Lungs no rales or rhonchi Heart regular rate and rhythm Abd soft, nontender, positive bowel sounds MSK no focal spinal tenderness, no upper extremity lymphedema Neuro: nonfocal, well oriented, positive affect Breasts: The right breast is unremarkable. The left breast is status post lumpectomy and radiation. There is some asymmetry in the breast contour as expected and as previously  noted. There is no evidence of local recurrence. The left axilla is benign.    Lab Results: Lab Results  Component Value Date   WBC 5.5 01/17/2015   HGB 14.0 01/17/2015   HCT 41.5 01/17/2015   MCV 80.0 01/17/2015   PLT 306 01/17/2015   NEUTROABS 2.6 01/17/2015     Chemistry      Component Value Date/Time   NA 140 01/17/2015 1324   NA 137 10/30/2014 1400   K 3.9 01/17/2015 1324   K 4.0 10/30/2014 1400   CL 97 10/30/2014 1400   CL  99 12/20/2012 1336   CO2 28 01/17/2015 1324   CO2 29 10/30/2014 1400   BUN 11.2 01/17/2015 1324   BUN 11 10/30/2014 1400   CREATININE 0.8 01/17/2015 1324   CREATININE 0.69 10/30/2014 1400   CREATININE 0.83 12/21/2011 0835      Component Value Date/Time   CALCIUM 9.9 01/17/2015 1324   CALCIUM 10.2 10/30/2014 1400   ALKPHOS 84 01/17/2015 1324   ALKPHOS 82 10/30/2014 1400   AST 20 01/17/2015 1324   AST 26 10/30/2014 1400   ALT 12 01/17/2015 1324   ALT 17 10/30/2014 1400   BILITOT 0.43 01/17/2015 1324   BILITOT 0.6 10/30/2014 1400        Radiological Studies: CLINICAL DATA: Screening.  EXAM: DIGITAL SCREENING BILATERAL MAMMOGRAM WITH CAD  COMPARISON: Previous exam(s).  ACR Breast Density Category c: The breast tissue is heterogeneously dense, which may obscure small masses.  FINDINGS: There are no findings suspicious for malignancy. Images were processed with CAD.  IMPRESSION: No mammographic evidence of malignancy. A result letter of this screening mammogram will be mailed directly to the patient.  RECOMMENDATION: Screening mammogram in one year. (Code:SM-B-01Y)  BI-RADS CATEGORY 1: Negative.   Electronically Signed  By: Lovey Newcomer M.D.  On: 11/07/2014 16:37   Assessment:    74 y.o.  Tammy Hensley with a history of multifocal infiltrating lobular carcinoma, estrogen receptor positive at 92%, progesterone receptor positive at 86%, with no HER-2 amplification, and a Ki-67 of 6%.   (1) status post left lumpectomy with sentinel node dissection on 09/14/2005 for an mpT2 pN0, stage IIA invasive lobular breast cancer, grade 1, with negative margins  (2) adjuvant radiation therapy was completed on 01/28/2006  (3) completed 5 years of tamoxifen on 03/25/2011.   Plan:  Tammy Hensley is now just about 10 years out from her definitive surgery, with no evidence of disease recurrence. I feel very comfortable at this point releasing her to her primary care  physician.  From a breast cancer point of view, all she will need is yearly mammography and a yearly physician breast exam.  We will be glad to see [name] at any point in the future if and when the need arises, but as of now we're making no further routine appointments for her here.  Chauncey Cruel, MD  01/24/2015

## 2015-02-07 ENCOUNTER — Ambulatory Visit (INDEPENDENT_AMBULATORY_CARE_PROVIDER_SITE_OTHER): Payer: Medicare Other | Admitting: Internal Medicine

## 2015-02-07 ENCOUNTER — Encounter: Payer: Self-pay | Admitting: Internal Medicine

## 2015-02-07 VITALS — BP 138/78 | HR 72 | Temp 97.7°F | Resp 16 | Ht 62.75 in | Wt 146.8 lb

## 2015-02-07 DIAGNOSIS — E039 Hypothyroidism, unspecified: Secondary | ICD-10-CM

## 2015-02-07 DIAGNOSIS — Z1331 Encounter for screening for depression: Secondary | ICD-10-CM

## 2015-02-07 DIAGNOSIS — Z1212 Encounter for screening for malignant neoplasm of rectum: Secondary | ICD-10-CM

## 2015-02-07 DIAGNOSIS — Z79899 Other long term (current) drug therapy: Secondary | ICD-10-CM

## 2015-02-07 DIAGNOSIS — I10 Essential (primary) hypertension: Secondary | ICD-10-CM

## 2015-02-07 DIAGNOSIS — D509 Iron deficiency anemia, unspecified: Secondary | ICD-10-CM

## 2015-02-07 DIAGNOSIS — R6889 Other general symptoms and signs: Secondary | ICD-10-CM

## 2015-02-07 DIAGNOSIS — E119 Type 2 diabetes mellitus without complications: Secondary | ICD-10-CM

## 2015-02-07 DIAGNOSIS — C50912 Malignant neoplasm of unspecified site of left female breast: Secondary | ICD-10-CM

## 2015-02-07 DIAGNOSIS — Z0000 Encounter for general adult medical examination without abnormal findings: Secondary | ICD-10-CM

## 2015-02-07 DIAGNOSIS — Z9181 History of falling: Secondary | ICD-10-CM

## 2015-02-07 DIAGNOSIS — E785 Hyperlipidemia, unspecified: Secondary | ICD-10-CM

## 2015-02-07 DIAGNOSIS — Z0001 Encounter for general adult medical examination with abnormal findings: Secondary | ICD-10-CM

## 2015-02-07 DIAGNOSIS — E559 Vitamin D deficiency, unspecified: Secondary | ICD-10-CM

## 2015-02-07 NOTE — Patient Instructions (Signed)

## 2015-02-07 NOTE — Progress Notes (Signed)
Patient ID: Tammy Hensley, female   DOB: 1941-06-29, 74 y.o.   MRN: [id number]  [facility] VISIT AND CPE  Assessment:   1. Essential hypertension  - Microalbumin / creatinine urine ratio - EKG 12-Lead - [place], RETROPERITNL ABD,  LTD - TSH  2. Hyperlipidemia  - Lipid panel  3. T2_NIDDM  - Hemoglobin A1c - Insulin, random - HM DIABETES FOOT EXAM - LOW EXTREMITY NEUR EXAM DOCUM  4. Iron def anemia  5. Vitamin D deficiency  - Vit D  25 hydroxy (rtn osteoporosis monitoring)  6. Hypothyroidism, unspecified hypothyroidism type   7. Breast cancer, left breast   8. Screening for rectal cancer  - POC Hemoccult Bld/Stl (3-Cd Home Screen); Future  9. Depression screen   10. Medication management  - Urine Microscopic - CBC with Differential/Platelet - BASIC METABOLIC PANEL WITH GFR - Hepatic function panel - Magnesium  11. At low risk for fall   12. Routine general medical examination at a health care facility   Plan:   During the course of the visit the patient was educated and counseled about appropriate screening and preventive services including:    Pneumococcal vaccine   Influenza vaccine  Td vaccine  Screening electrocardiogram  Bone densitometry screening  Colorectal cancer screening  Diabetes screening  Glaucoma screening  Nutrition counseling   Advanced directives: requested  Screening recommendations, referrals: Vaccinations:  Immunization History  Administered Date(s) Administered  . Pneumococcal-Unspecified 12/29/2011  . Td 12/29/2011  Influenza vaccine 2014 - deferred 2015 due to illness Prevnar vaccine ordered Shingles vaccine undecided Hep B vaccine not indicated  Nutrition assessed and recommended  Colonoscopy 2014 Recommended yearly ophthalmology/optometry visit for glaucoma screening and checkup Recommended yearly dental visit for hygiene and checkup Advanced directives - yes  Conditions/risks  identified: BMI: Discussed weight loss, diet, and increase physical activity.  Increase physical activity: AHA recommends 150 minutes of physical activity a week.  Medications reviewed Diabetes is not at goal, ACE/ARB therapy: no Urinary Incontinence is not an issue: discussed non pharmacology and pharmacology options.  Fall risk: low- discussed PT, home fall assessment, medications.   Subjective:    Tammy Hensley  presents for [employer] Visit and complete physical.  Date of last medicare wellness visit was 10/30/2014.  This very nice 74 y.o. [name] presents  with Hypertension, Hyperlipidemia,  T2_NIDDM and Vitamin D Deficiency.    Patient is treated for HTN since 1995 & BP has been controlled at home. Today's BP: 138/78 mmHg. Patient has had no complaints of any cardiac type chest pain, palpitations, dyspnea/orthopnea/PND, dizziness, claudication, or dependent edema.   Hyperlipidemia is controlled with diet & meds. Patient denies myalgias or other med SE's. Last Lipids were near , but not at goal -  Total Chol 179; HDL 46; LDL  109*; Trig 119 on 10/30/2014.   Also, the patient has history of T2_NIDDM since 2009 with A1c 6.6% and was 7.3% in 2013 and has had no symptoms of reactive hypoglycemia, diabetic polys, paresthesias or visual blurring.  Last A1c was 6.5% on 10/30/2014   Further, the patient also has history of Vitamin D Deficiency  and supplements vitamin D without any suspected side-effects. Last vitamin D was 75 on 10/30/2014  Names of Other Physician/Practitioners you currently use: 1. [facility] here for primary care 2.Dr Delman Cheadle, eye doctor, last visit 2015 3. Dr Ardyth Gal, DDS, dentist, last visit 2015 and sch next month  Patient Care Team: [name],  MD as PCP - General (Internal Medicine) [name], MD as Consulting Physician (Gastroenterology) [name], MD as Consulting Physician (Ophthalmology) Chauncey Cruel, MD as Consulting Physician (Oncology)  Medication Review: Medication Sig  . aspirin 81 MG tablet Take 81 mg by mouth daily.    . Calcium Carbonate-Vitamin D (CALCIUM 600 + D  Take by mouth 2 (two) times daily.   Marland Kitchen levothyroxine  88 MCG tablet Take 1 tablet (88 mcg total) by mouth daily before breakfast.  . meloxicam (MOBIC) 15 MG tablet Can take 1/2-1 pill as needed for pain WITH FOOD  . Multi Vitamins-Minerals  Take by mouth.  . pravastatin (PRAVACHOL) 40 MG tablet Take 40 mg by mouth daily.  Marland Kitchen triamterene-hctz (MAXZIDE) 75-50 MG per tablet TAKE ONE TABLET BY MOUTH ONCE DAILY FOR BLOOD PRESSURE AND  FLUID  . VITAMIN D, ERGOCALCIFEROL, PO Take 4,000 Int'l Units by mouth daily.   . metFORMIN (GLUCOPHAGE XR) 500 MG 24 hr tablet Take 1 tablet with breakfast and lunch and 2 tablets with supper for diabetes.   Current Problems (verified) Patient Active Problem List   Diagnosis Date Noted  . Prediabetes 01/03/2014  . Medication management 01/03/2014  . Breast cancer, left breast 12/19/2013  . Hypothyroid   . Hypertension   . Hyperlipidemia   . Vitamin D deficiency   . Iron deficiency anemia    Screening Tests Health Maintenance  Topic Date Due  . OPHTHALMOLOGY EXAM  06/25/1951  . TETANUS/TDAP  06/24/1960  . COLONOSCOPY  06/25/1991  . ZOSTAVAX  06/24/2001  . PNA vac Low Risk Adult (2 of 2 - PPSV23) 12/28/2012  . INFLUENZA VACCINE  06/23/2014  . URINE MICROALBUMIN  01/03/2015  . FOOT EXAM  04/05/2015  . HEMOGLOBIN A1C  05/01/2015  . MAMMOGRAM  11/07/2016  . DEXA SCAN  Completed    Immunization History  Administered Date(s) Administered  . Pneumococcal-Unspecified 12/29/2011  . Td 12/29/2011    Preventative care: Last colonoscopy: 2014  History reviewed: allergies, current medications, past family history, past medical history, past social history, past surgical history and problem list  Risk Factors: Tobacco History  Substance Use Topics  . Smoking status:  Never Smoker   . Smokeless tobacco: Not on file  . Alcohol Use: No   She does not smoke.  Patient is not a former smoker. Are there smokers in your home (other than you)?  No Alcohol Current alcohol use: none  Caffeine Current caffeine use: denies use  Exercise Current exercise: walking  Nutrition/Diet Current diet: in general, a "healthy" diet    Cardiac risk factors: advanced age (older than 49 for men, 54 for women), diabetes mellitus, dyslipidemia, hypertension and sedentary lifestyle.  Depression Screen (Note: if answer to either of the following is "Yes", a more complete depression screening is indicated)   Q1: Over the past two weeks, have you felt down, depressed or hopeless? No  Q2: Over the past two weeks, have you felt little interest or pleasure in doing things? No  Have you lost interest or pleasure in daily life? No  Do you often feel hopeless? No  Do you cry easily over simple problems? No  Activities of Daily Living In your present state of health, do you have any difficulty performing the following activities?:  Driving? No Managing money?  No Feeding yourself? No Getting from bed to chair? No Climbing a flight of stairs? No Preparing food and eating?: No Bathing or showering? No Getting dressed: No Getting to  the toilet? No Using the toilet:No Moving around from place to place: No In the past year have you fallen or had a near fall?:No   Are you sexually active?  Yes  Do you have more than one partner?  No  Vision Difficulties: No  Hearing Difficulties: No Do you often ask people to speak up or repeat themselves? No Do you experience ringing or noises in your ears? No Do you have difficulty understanding soft or whispered voices? Sometimes.  Cognition  Do you feel that you have a problem with memory?No  Do you often misplace items? No  Do you feel safe at home?  Yes  Advanced directives Does patient have a [place]?  Yes Does patient have a Living Will? Yes  Past Medical History  Diagnosis Date  . Thyroid disease   . Hypertension   . Hyperlipidemia   . Diabetes mellitus without complication   . Hypothyroid   . Depression   . Cancer     left breast  . Vitamin D deficiency   . Iron deficiency anemia   . Allergy   . Insomnia    Past Surgical History  Procedure Laterality Date  . Mastectomy Left 2006    ROS: Constitutional: Denies fever, chills, weight loss/gain, headaches, insomnia, fatigue, night sweats, and change in appetite. Eyes: Denies redness, blurred vision, diplopia, discharge, itchy, watery eyes.  ENT: Denies discharge, congestion, post nasal drip, epistaxis, sore throat, earache, hearing loss, dental pain, Tinnitus, Vertigo, Sinus pain, snoring.  Cardio: Denies chest pain, palpitations, irregular heartbeat, syncope, dyspnea, diaphoresis, orthopnea, PND, claudication, edema Respiratory: denies cough, dyspnea, DOE, pleurisy, hoarseness, laryngitis, wheezing.  Gastrointestinal: Denies dysphagia, heartburn, reflux, water brash, pain, cramps, nausea, vomiting, bloating, diarrhea, constipation, hematemesis, melena, hematochezia, jaundice, hemorrhoids Genitourinary: Denies dysuria, frequency, urgency, nocturia, hesitancy, discharge, hematuria, flank pain Breast: Breast lumps, nipple discharge, bleeding.  Musculoskeletal: Denies arthralgia, myalgia, stiffness, Jt. Swelling, pain, limp, and strain/sprain. Denies falls. Skin: Denies puritis, rash, hives, warts, acne, eczema, changing in skin lesion Neuro: No weakness, tremor, incoordination, spasms, paresthesia, pain Psychiatric: Denies confusion, memory loss, sensory loss. Denies Depression. Endocrine: Denies change in weight, skin, hair change, nocturia, and paresthesia, diabetic polys, visual blurring, hyper / hypo glycemic episodes.  Heme/Lymph: No excessive bleeding, bruising, enlarged lymph nodes  Objective:     BP 138/78   Pulse 72   Temp 97.7 F   Resp 16  Ht 5' 2.75"   Wt 146 lb 12.8 oz     BMI 26.21   General Appearance: Well nourished, alert, WD/WN, femaleand in no apparent distress. Eyes: PERRLA, EOMs, conjunctiva no swelling or erythema, normal fundi and vessels. Sinuses: No frontal/maxillary tenderness ENT/Mouth: EACs patent / TMs  nl. Nares clear without erythema, swelling, mucoid exudates. Oral hygiene is good. No erythema, swelling, or exudate. Tongue normal, non-obstructing. Tonsils not swollen or erythematous. Hearing normal.  Neck: Supple, thyroid normal. No bruits, nodes or JVD. Respiratory: Respiratory effort normal.  BS equal and clear bilateral without rales, rhonci, wheezing or stridor. Cardio: Heart sounds are normal with regular rate and rhythm and no murmurs, rubs or gallops. Peripheral pulses are normal and equal bilaterally without edema. No aortic or femoral bruits. Chest: symmetric with normal excursions and percussion. Breasts: Symmetric, without lumps, nipple discharge, retractions, or fibrocystic changes.  Abdomen: Flat, soft  with nl bowel sounds. Nontender, no guarding, rebound, hernias, masses, or organomegaly.  Lymphatics: Non tender without lymphadenopathy.  Musculoskeletal: Full ROM all peripheral extremities, joint stability, 5/5  strength, and normal gait. Skin: Warm and dry without rashes, lesions, cyanosis, clubbing or  ecchymosis.  Neuro: Cranial nerves intact, reflexes equal bilaterally. Normal muscle tone, no cerebellar symptoms. Sensation intact by monofilament testing.  Pysch: Awake and oriented X 3, normal affect, Insight and Judgment appropriate.   Cognitive Testing  Alert? Yes  Normal Appearance?Yes  Oriented to person? Yes  Place? Yes   Time? Yes  Recall of three objects?  Yes  Can perform simple calculations? Yes  Displays appropriate judgment? Yes  Can read the correct time from a watch/clock?Yes  Medicare Attestation I have personally reviewed: The patient's  medical and social history Their use of alcohol, tobacco or illicit drugs Their current medications and supplements The patient's functional ability including ADLs,fall risks, home safety risks, cognitive, and hearing and visual impairment Diet and physical activities Evidence for depression or mood disorders  The patient's weight, height, BMI, and visual acuity have been recorded in the chart.  I have made referrals, counseling, and provided education to the patient based on review of the above and I have provided the patient with a written personalized care plan for preventive services.    Myshawn Chiriboga DAVID, MD   02/07/2015

## 2015-02-08 LAB — CBC WITH DIFFERENTIAL/PLATELET
BASOS PCT: 0 % (ref 0–1)
Basophils Absolute: 0 10*3/uL (ref 0.0–0.1)
EOS ABS: 0.1 10*3/uL (ref 0.0–0.7)
EOS PCT: 1 % (ref 0–5)
HEMATOCRIT: 42.7 % (ref 36.0–46.0)
HEMOGLOBIN: 14.3 g/dL (ref 12.0–15.0)
LYMPHS ABS: 2.5 10*3/uL (ref 0.7–4.0)
Lymphocytes Relative: 46 % (ref 12–46)
MCH: 26.3 pg (ref 26.0–34.0)
MCHC: 33.5 g/dL (ref 30.0–36.0)
MCV: 78.6 fL (ref 78.0–100.0)
MPV: 9.3 fL (ref 8.6–12.4)
Monocytes Absolute: 0.5 10*3/uL (ref 0.1–1.0)
Monocytes Relative: 9 % (ref 3–12)
Neutro Abs: 2.4 10*3/uL (ref 1.7–7.7)
Neutrophils Relative %: 44 % (ref 43–77)
Platelets: 344 10*3/uL (ref 150–400)
RBC: 5.43 MIL/uL — ABNORMAL HIGH (ref 3.87–5.11)
RDW: 15.4 % (ref 11.5–15.5)
WBC: 5.4 10*3/uL (ref 4.0–10.5)

## 2015-02-08 LAB — LIPID PANEL
CHOL/HDL RATIO: 3.9 ratio
CHOLESTEROL: 169 mg/dL (ref 0–200)
HDL: 43 mg/dL — ABNORMAL LOW (ref >=46)
LDL Cholesterol: 101 mg/dL — ABNORMAL HIGH (ref 0–99)
Triglycerides: 125 mg/dL (ref <150)
VLDL: 25 mg/dL (ref 0–40)

## 2015-02-08 LAB — BASIC METABOLIC PANEL WITH GFR
BUN: 10 mg/dL (ref 6–23)
CHLORIDE: 98 meq/L (ref 96–112)
CO2: 29 meq/L (ref 19–32)
CREATININE: 0.63 mg/dL (ref 0.50–1.10)
Calcium: 10 mg/dL (ref 8.4–10.5)
GFR, Est African American: >89 mL/min
GFR, Est Non African American: 89 mL/min
GLUCOSE: 76 mg/dL (ref 70–99)
Potassium: 4.1 mEq/L (ref 3.5–5.3)
Sodium: 137 mEq/L (ref 135–145)

## 2015-02-08 LAB — INSULIN, RANDOM: INSULIN: 10 u[IU]/mL (ref 2.0–19.6)

## 2015-02-08 LAB — URINALYSIS, MICROSCOPIC ONLY
BACTERIA UA: NONE SEEN
Casts: NONE SEEN
Crystals: NONE SEEN
Squamous Epithelial / LPF: NONE SEEN

## 2015-02-08 LAB — TSH: TSH: 0.574 u[IU]/mL (ref 0.350–4.500)

## 2015-02-08 LAB — MICROALBUMIN / CREATININE URINE RATIO
Creatinine, Urine: 42.9 mg/dL
Microalb Creat Ratio: 14 mg/g (ref 0.0–30.0)
Microalb, Ur: 0.6 mg/dL (ref <2.0)

## 2015-02-08 LAB — HEMOGLOBIN A1C
Hgb A1c MFr Bld: 6.1 % — ABNORMAL HIGH (ref <5.7)
MEAN PLASMA GLUCOSE: 128 mg/dL — AB (ref <117)

## 2015-02-08 LAB — HEPATIC FUNCTION PANEL
ALT: 16 U/L (ref 0–35)
AST: 23 U/L (ref 0–37)
Albumin: 4.3 g/dL (ref 3.5–5.2)
Alkaline Phosphatase: 87 U/L (ref 39–117)
BILIRUBIN INDIRECT: 0.6 mg/dL (ref 0.2–1.2)
BILIRUBIN TOTAL: 0.7 mg/dL (ref 0.2–1.2)
Bilirubin, Direct: 0.1 mg/dL (ref 0.0–0.3)
Total Protein: 7.7 g/dL (ref 6.0–8.3)

## 2015-02-08 LAB — MAGNESIUM: Magnesium: 2 mg/dL (ref 1.5–2.5)

## 2015-02-08 LAB — VITAMIN D 25 HYDROXY (VIT D DEFICIENCY, FRACTURES): Vit D, 25-Hydroxy: 81 ng/mL (ref 30–100)

## 2015-02-09 ENCOUNTER — Encounter: Payer: Self-pay | Admitting: Internal Medicine

## 2015-02-14 ENCOUNTER — Other Ambulatory Visit: Payer: Self-pay | Admitting: Internal Medicine

## 2015-02-14 DIAGNOSIS — E119 Type 2 diabetes mellitus without complications: Secondary | ICD-10-CM

## 2015-02-14 DIAGNOSIS — E782 Mixed hyperlipidemia: Secondary | ICD-10-CM

## 2015-02-14 MED ORDER — METFORMIN HCL ER 500 MG PO TB24: ORAL_TABLET | ORAL | Status: DC

## 2015-02-14 MED ORDER — PRAVASTATIN SODIUM 40 MG PO TABS: 40.0000 mg | ORAL_TABLET | Freq: Every day | ORAL | Status: DC

## 2015-02-18 ENCOUNTER — Other Ambulatory Visit (INDEPENDENT_AMBULATORY_CARE_PROVIDER_SITE_OTHER): Payer: Medicare Other

## 2015-02-18 DIAGNOSIS — Z1212 Encounter for screening for malignant neoplasm of rectum: Secondary | ICD-10-CM

## 2015-02-18 LAB — POC HEMOCCULT BLD/STL (HOME/3-CARD/SCREEN)
FECAL OCCULT BLD: NEGATIVE
FECAL OCCULT BLD: NEGATIVE
Fecal Occult Blood, POC: NEGATIVE

## 2015-04-02 ENCOUNTER — Other Ambulatory Visit: Payer: Self-pay | Admitting: Internal Medicine

## 2015-05-22 ENCOUNTER — Ambulatory Visit (INDEPENDENT_AMBULATORY_CARE_PROVIDER_SITE_OTHER): Payer: Medicare Other | Admitting: Internal Medicine

## 2015-05-22 ENCOUNTER — Encounter: Payer: Self-pay | Admitting: Internal Medicine

## 2015-05-22 VITALS — BP 132/72 | HR 80 | Temp 97.7°F | Resp 16 | Ht 62.5 in | Wt 149.0 lb

## 2015-05-22 DIAGNOSIS — Z79899 Other long term (current) drug therapy: Secondary | ICD-10-CM

## 2015-05-22 DIAGNOSIS — E559 Vitamin D deficiency, unspecified: Secondary | ICD-10-CM

## 2015-05-22 DIAGNOSIS — I10 Essential (primary) hypertension: Secondary | ICD-10-CM

## 2015-05-22 DIAGNOSIS — D509 Iron deficiency anemia, unspecified: Secondary | ICD-10-CM

## 2015-05-22 DIAGNOSIS — E663 Overweight: Secondary | ICD-10-CM

## 2015-05-22 DIAGNOSIS — E785 Hyperlipidemia, unspecified: Secondary | ICD-10-CM

## 2015-05-22 DIAGNOSIS — E039 Hypothyroidism, unspecified: Secondary | ICD-10-CM

## 2015-05-22 DIAGNOSIS — E119 Type 2 diabetes mellitus without complications: Secondary | ICD-10-CM

## 2015-05-22 LAB — CBC WITH DIFFERENTIAL/PLATELET
Basophils Absolute: 0 10*3/uL (ref 0.0–0.1)
Basophils Relative: 0 % (ref 0–1)
Eosinophils Absolute: 0 10*3/uL (ref 0.0–0.7)
Eosinophils Relative: 1 % (ref 0–5)
HCT: 42.4 % (ref 36.0–46.0)
Hemoglobin: 14.5 g/dL (ref 12.0–15.0)
LYMPHS PCT: 46 % (ref 12–46)
Lymphs Abs: 2.3 10*3/uL (ref 0.7–4.0)
MCH: 26.8 pg (ref 26.0–34.0)
MCHC: 34.2 g/dL (ref 30.0–36.0)
MCV: 78.4 fL (ref 78.0–100.0)
MPV: 9.9 fL (ref 8.6–12.4)
Monocytes Absolute: 0.3 10*3/uL (ref 0.1–1.0)
Monocytes Relative: 7 % (ref 3–12)
Neutro Abs: 2.3 10*3/uL (ref 1.7–7.7)
Neutrophils Relative %: 46 % (ref 43–77)
Platelets: 299 10*3/uL (ref 150–400)
RBC: 5.41 MIL/uL — ABNORMAL HIGH (ref 3.87–5.11)
RDW: 14.8 % (ref 11.5–15.5)
WBC: 4.9 10*3/uL (ref 4.0–10.5)

## 2015-05-22 LAB — BASIC METABOLIC PANEL WITH GFR
BUN: 10 mg/dL (ref 6–23)
CO2: 30 meq/L (ref 19–32)
CREATININE: 0.71 mg/dL (ref 0.50–1.10)
Calcium: 10.4 mg/dL (ref 8.4–10.5)
Chloride: 97 mEq/L (ref 96–112)
GFR, Est Non African American: 85 mL/min
GLUCOSE: 103 mg/dL — AB (ref 70–99)
Potassium: 4.5 mEq/L (ref 3.5–5.3)
Sodium: 139 mEq/L (ref 135–145)

## 2015-05-22 LAB — MAGNESIUM: Magnesium: 2.1 mg/dL (ref 1.5–2.5)

## 2015-05-22 LAB — LIPID PANEL
Cholesterol: 164 mg/dL (ref 0–200)
HDL: 54 mg/dL (ref >=46)
LDL Cholesterol: 88 mg/dL (ref 0–99)
Total CHOL/HDL Ratio: 3 Ratio
Triglycerides: 112 mg/dL (ref <150)
VLDL: 22 mg/dL (ref 0–40)

## 2015-05-22 LAB — HEPATIC FUNCTION PANEL
ALBUMIN: 4.4 g/dL (ref 3.5–5.2)
ALK PHOS: 85 U/L (ref 39–117)
ALT: 11 U/L (ref 0–35)
AST: 18 U/L (ref 0–37)
BILIRUBIN TOTAL: 0.6 mg/dL (ref 0.2–1.2)
Bilirubin, Direct: 0.1 mg/dL (ref 0.0–0.3)
Indirect Bilirubin: 0.5 mg/dL (ref 0.2–1.2)
TOTAL PROTEIN: 7.4 g/dL (ref 6.0–8.3)

## 2015-05-22 LAB — HEMOGLOBIN A1C
Hgb A1c MFr Bld: 6.1 % — ABNORMAL HIGH (ref <5.7)
MEAN PLASMA GLUCOSE: 128 mg/dL — AB (ref <117)

## 2015-05-22 LAB — TSH: TSH: 0.937 u[IU]/mL (ref 0.350–4.500)

## 2015-05-22 NOTE — Progress Notes (Signed)
Patient ID: Tammy Hensley, female   DOB: 09-28-1941, 74 y.o.   MRN: [id number]  Assessment and Plan:  Hypertension:  -Continue medication -monitor blood pressure at home. -Continue DASH diet -Reminder to go to the ER if any CP, SOB, nausea, dizziness, severe HA, changes vision/speech, left arm numbness and tingling and jaw pain.  Cholesterol - Continue diet and exercise -Check cholesterol.   Diabetes without complications -Continue diet and exercise.  -Check A1C  Vitamin D Def -check level -continue medications.   Continue diet and meds as discussed. Further disposition pending results of labs. Discussed med's effects and SE's.    HPI 74 y.o. female  presents for 3 month follow up with hypertension, hyperlipidemia, diabetes and vitamin D deficiency.   Her blood pressure has been controlled at home, today their BP is BP: 132/72 mmHg.She does workout. She denies chest pain, shortness of breath, dizziness.   She is on cholesterol medication and denies myalgias. Her cholesterol is at goal. The cholesterol was:  02/07/2015: Cholesterol 169; HDL 43*; LDL Cholesterol 101*; Triglycerides 125   She has been working on diet and exercise for diabetes without complications, she is on bASA, she is not on ACE/ARB, and denies  foot ulcerations, hyperglycemia, hypoglycemia , increased appetite, nausea, paresthesia of the feet, polydipsia, polyuria, visual disturbances, vomiting and weight loss. Last A1C was: 02/07/2015: Hgb A1c MFr Bld 6.1*,   She does report that her blood sugars usually run between 70-90.     Patient is on Vitamin D supplement. 02/07/2015: Vit D, 25-Hydroxy 81  She does report that she feels like she is not sleeping well.  She has trouble falling asleep and staying asleep.  She feels like it is especially bad after having sex.     Current Medications:  Current Outpatient Prescriptions on File Prior to Visit  Medication Sig Dispense Refill  . aspirin 81 MG tablet Take 81 mg by  mouth daily.      . Calcium Carbonate-Vitamin D (CALCIUM 600 + D PO) Take by mouth 2 (two) times daily.     Marland Kitchen levothyroxine (SYNTHROID, LEVOTHROID) 88 MCG tablet Take 1 tablet (88 mcg total) by mouth daily before breakfast. 90 tablet 1  . meloxicam (MOBIC) 15 MG tablet Can take 1/2-1 pill as needed for pain WITH FOOD 30 tablet 3  . metFORMIN (GLUCOPHAGE XR) 500 MG 24 hr tablet Take 1 tablet with breakfast and lunch and 2 tablets with supper for diabetes. 360 tablet 1  . Multiple Vitamins-Minerals (MULTIVITAMIN PO) Take by mouth.    . pravastatin (PRAVACHOL) 40 MG tablet Take 1 tablet (40 mg total) by mouth daily. 90 tablet 1  . triamterene-hydrochlorothiazide (MAXZIDE) 75-50 MG per tablet TAKE ONE TABLET BY MOUTH ONCE DAILY FOR BLOOD PRESSURE AND  FLUID 90 tablet 3  . VITAMIN D, ERGOCALCIFEROL, PO Take 4,000 Int'l Units by mouth daily.      No current facility-administered medications on file prior to visit.   Medical History:  Past Medical History  Diagnosis Date  . Thyroid disease   . Hypertension   . Hyperlipidemia   . Diabetes mellitus without complication   . Hypothyroid   . Depression   . Cancer     left breast  . Vitamin D deficiency   . Iron deficiency anemia   . Allergy   . Insomnia    Allergies:  Allergies  Allergen Reactions  . Calan [Verapamil]     Constipation   . Effexor [Venlafaxine]   . Erythromycin   .  Femara [Letrozole]   . Fosamax [Alendronate]     aching  . Ibuprofen   . Paxil [Paroxetine Hcl] Nausea Only  . Remeron [Mirtazapine]     Weight gain   . Tamoxifen     Hair loss @ low dose  . Vasotec [Enalapril] Cough     Review of Systems:  Review of Systems  Constitutional: Negative for fever, chills and malaise/fatigue.  HENT: Negative for congestion and sore throat.   Eyes: Negative.   Respiratory: Negative for cough, shortness of breath and wheezing.   Cardiovascular: Negative for chest pain, palpitations and leg swelling.  Gastrointestinal:  Negative for heartburn, diarrhea, constipation, blood in stool and melena.  Genitourinary: Negative.   Neurological: Negative for dizziness, sensory change and headaches.  Psychiatric/Behavioral: Negative for depression. The patient has insomnia. The patient is not nervous/anxious.     Family history- Review and unchanged  Social history- Review and unchanged  Physical Exam: BP 132/72 mmHg  Pulse 80  Temp(Src) 97.7 F (36.5 C)  Resp 16  Ht 5' 2.5" (1.588 m)  Wt 149 lb (67.586 kg)  BMI 26.80 kg/m2 Wt Readings from Last 3 Encounters:  05/22/15 149 lb (67.586 kg)  02/07/15 146 lb 12.8 oz (66.588 kg)  10/30/14 155 lb (70.308 kg)   General Appearance: Well nourished well developed, non-toxic appearing, in no apparent distress. Eyes: PERRLA, EOMs, conjunctiva no swelling or erythema ENT/Mouth: Ear canals clear with no erythema, swelling, or discharge.  TMs normal bilaterally, oropharynx clear, moist, with no exudate.   Neck: Supple, thyroid normal, no JVD, no cervical adenopathy.  Respiratory: Respiratory effort normal, breath sounds clear A&P, no wheeze, rhonchi or rales noted.  No retractions, no accessory muscle usage Cardio: RRR with no MRGs. No noted edema.  Abdomen: Soft, + BS.  Non tender, no guarding, rebound, hernias, masses. Musculoskeletal: Full ROM, 5/5 strength, Normal gait Skin: Warm, dry without rashes, lesions, ecchymosis.  Neuro: Awake and oriented X 3, Cranial nerves intact. No cerebellar symptoms.  Psych: normal affect, Insight and Judgment appropriate.    FORCUCCI, Merlean Pizzini, PA-C 9:58 AM [facility]

## 2015-05-22 NOTE — Patient Instructions (Signed)
Please start taking melatonin about 20 minutes prior to bedtime.  You can take up to 10 mg at a time.  This is available over the counter.    Melatonin oral capsules and tablets What is this medicine? MELATONIN (mel uh TOH nin) is a dietary supplement. It is promoted to help maintain normal sleep patterns. The FDA has not approved this supplement for any medical use. This supplement may be used for other purposes; ask your health care provider or pharmacist if you have questions. This medicine may be used for other purposes; ask your health care provider or pharmacist if you have questions. COMMON BRAND NAME(S): Melatonex What should I tell my health care provider before I take this medicine? They need to know if you have any of these conditions: -cancer -if you frequently drink alcohol containing drinks -immune system problems -liver disease -seizure disorder -an unusual or allergic reaction to melatonin, other medicines, foods, dyes, or preservatives -pregnant or trying to get pregnant -breast-feeding How should I use this medicine? Take this supplement by mouth with a glass of water. This supplement is usually taken prior to bedtime. Do not chew or crush most tablets or capsules. Some tablets are chewable and are chewed before swallowing. Some tablets are meant to be dissolved in the mouth or under the tongue. Follow the directions on the package labeling, or take as directed by your health care professional. Do not take this supplement more often than directed. Talk to your pediatrician regarding the use of this supplement in children. This supplement is not recommended for use in children. Overdosage: If you think you have taken too much of this medicine contact a poison control center or emergency room at once. NOTE: This medicine is only for you. Do not share this medicine with others. What if I miss a dose? This does not apply; this medicine is not for regular use. Do not take double  or extra doses. What may interact with this medicine? Check with your doctor or healthcare professional if you are taking any of the following medications: -hormone medicines -medicines for blood pressure like nifedipine -medications for anxiety, depression, or other emotional or psychiatric problems -medications for seizures -medications for sleep -other herbal or dietary supplements -tamoxifen -treatments for cancer or immune disorders This list may not describe all possible interactions. Give your health care provider a list of all the medicines, herbs, non-prescription drugs, or dietary supplements you use. Also tell them if you smoke, drink alcohol, or use illegal drugs. Some items may interact with your medicine. What should I watch for while using this medicine? See your doctor if your symptoms do not get better or if they get worse. Do not take this supplement for more than 2 weeks unless your doctor tells you to. You may get drowsy or dizzy. Do not drive, use machinery, or do anything that needs mental alertness until you know how this medicine affects you. Do not stand or sit up quickly, especially if you are an older patient. This reduces the risk of dizzy or fainting spells. Alcohol may interfere with the effect of this medicine. Avoid alcoholic drinks. Talk to your doctor before you use this supplement if you are currently being treated for an emotional, mental, or sleep problem. This medicine may interfere with your treatment. Herbal or dietary supplements are not regulated like medicines. Rigid quality control standards are not required for dietary supplements. The purity and strength of these products can vary. The safety and effect of  this dietary supplement for a certain disease or illness is not well known. This product is not intended to diagnose, treat, cure or prevent any disease. The Food and Drug Administration suggests the following to help consumers protect  themselves: -Always read product labels and follow directions. -Natural does not mean a product is safe for humans to take. -Look for products that include USP after the ingredient name. This means that the manufacturer followed the standards of the [place] Pharmacopoeia. -Supplements made or sold by a nationally known food or drug company are more likely to be made under tight controls. You can write to the company for more information about how the product was made. What side effects may I notice from receiving this medicine? Side effects that you should report to your doctor or health care professional as soon as possible: -allergic reactions like skin rash, itching or hives, swelling of the face, lips, or tongue -breathing problems -confusion, forgetful -depressed, nervous, or other mood changes -fast or pounding heartbeat -trouble staying awake or alert during the day Side effects that usually do not require medical attention (report to your doctor or health care professional if they continue or are bothersome): -drowsiness, dizziness -headache -nightmares -upset stomach This list may not describe all possible side effects. Call your doctor for medical advice about side effects. You may report side effects to FDA at 1-800-FDA-1088. Where should I keep my medicine? Keep out of the reach of children. Store at room temperature or as directed on the package label. Protect from moisture. Throw away any unused supplement after the expiration date. NOTE: This sheet is a summary. It may not cover all possible information. If you have questions about this medicine, talk to your doctor, pharmacist, or health care provider.  2015, Elsevier/Gold Standard. (2013-09-22 18:36:27)

## 2015-05-23 LAB — INSULIN, RANDOM: INSULIN: 11.8 u[IU]/mL (ref 2.0–19.6)

## 2015-05-23 LAB — VITAMIN D 25 HYDROXY (VIT D DEFICIENCY, FRACTURES): Vit D, 25-Hydroxy: 89 ng/mL (ref 30–100)

## 2015-08-23 ENCOUNTER — Ambulatory Visit (INDEPENDENT_AMBULATORY_CARE_PROVIDER_SITE_OTHER): Payer: Medicare Other | Admitting: Internal Medicine

## 2015-08-23 ENCOUNTER — Encounter: Payer: Self-pay | Admitting: Internal Medicine

## 2015-08-23 VITALS — BP 116/74 | HR 72 | Temp 97.3°F | Resp 16 | Ht 62.75 in | Wt 148.9 lb

## 2015-08-23 DIAGNOSIS — E559 Vitamin D deficiency, unspecified: Secondary | ICD-10-CM

## 2015-08-23 DIAGNOSIS — E785 Hyperlipidemia, unspecified: Secondary | ICD-10-CM | POA: Diagnosis not present

## 2015-08-23 DIAGNOSIS — Z6826 Body mass index (BMI) 26.0-26.9, adult: Secondary | ICD-10-CM | POA: Diagnosis not present

## 2015-08-23 DIAGNOSIS — I10 Essential (primary) hypertension: Secondary | ICD-10-CM

## 2015-08-23 DIAGNOSIS — Z79899 Other long term (current) drug therapy: Secondary | ICD-10-CM | POA: Diagnosis not present

## 2015-08-23 DIAGNOSIS — E039 Hypothyroidism, unspecified: Secondary | ICD-10-CM

## 2015-08-23 DIAGNOSIS — E119 Type 2 diabetes mellitus without complications: Secondary | ICD-10-CM

## 2015-08-23 LAB — CBC WITH DIFFERENTIAL/PLATELET
BASOS PCT: 0 % (ref 0–1)
Basophils Absolute: 0 10*3/uL (ref 0.0–0.1)
Eosinophils Absolute: 0.1 10*3/uL (ref 0.0–0.7)
Eosinophils Relative: 2 % (ref 0–5)
HCT: 40.5 % (ref 36.0–46.0)
HEMOGLOBIN: 14 g/dL (ref 12.0–15.0)
LYMPHS ABS: 2 10*3/uL (ref 0.7–4.0)
Lymphocytes Relative: 42 % (ref 12–46)
MCH: 26.9 pg (ref 26.0–34.0)
MCHC: 34.6 g/dL (ref 30.0–36.0)
MCV: 77.7 fL — AB (ref 78.0–100.0)
MPV: 9.4 fL (ref 8.6–12.4)
Monocytes Absolute: 0.3 10*3/uL (ref 0.1–1.0)
Monocytes Relative: 7 % (ref 3–12)
NEUTROS ABS: 2.3 10*3/uL (ref 1.7–7.7)
NEUTROS PCT: 49 % (ref 43–77)
Platelets: 318 10*3/uL (ref 150–400)
RBC: 5.21 MIL/uL — ABNORMAL HIGH (ref 3.87–5.11)
RDW: 14.9 % (ref 11.5–15.5)
WBC: 4.7 10*3/uL (ref 4.0–10.5)

## 2015-08-23 LAB — BASIC METABOLIC PANEL WITH GFR
BUN: 10 mg/dL (ref 7–25)
CO2: 30 mmol/L (ref 20–31)
Calcium: 10 mg/dL (ref 8.6–10.4)
Chloride: 95 mmol/L — ABNORMAL LOW (ref 98–110)
Creat: 0.71 mg/dL (ref 0.60–0.93)
GFR, EST NON AFRICAN AMERICAN: 84 mL/min (ref >=60)
Glucose, Bld: 99 mg/dL (ref 65–99)
Potassium: 3.9 mmol/L (ref 3.5–5.3)
Sodium: 135 mmol/L (ref 135–146)

## 2015-08-23 LAB — MAGNESIUM: MAGNESIUM: 2 mg/dL (ref 1.5–2.5)

## 2015-08-23 LAB — LIPID PANEL
CHOLESTEROL: 167 mg/dL (ref 125–200)
HDL: 49 mg/dL (ref >=46)
LDL Cholesterol: 91 mg/dL (ref <130)
TRIGLYCERIDES: 136 mg/dL (ref <150)
Total CHOL/HDL Ratio: 3.4 Ratio (ref <=5.0)
VLDL: 27 mg/dL (ref <30)

## 2015-08-23 LAB — HEPATIC FUNCTION PANEL
ALBUMIN: 4.5 g/dL (ref 3.6–5.1)
ALT: 11 U/L (ref 6–29)
AST: 19 U/L (ref 10–35)
Alkaline Phosphatase: 87 U/L (ref 33–130)
BILIRUBIN INDIRECT: 0.5 mg/dL (ref 0.2–1.2)
Bilirubin, Direct: 0.2 mg/dL (ref <=0.2)
TOTAL PROTEIN: 7.2 g/dL (ref 6.1–8.1)
Total Bilirubin: 0.7 mg/dL (ref 0.2–1.2)

## 2015-08-23 LAB — TSH: TSH: 1.025 u[IU]/mL (ref 0.350–4.500)

## 2015-08-23 NOTE — Patient Instructions (Signed)

## 2015-08-24 DIAGNOSIS — Z6825 Body mass index (BMI) 25.0-25.9, adult: Secondary | ICD-10-CM | POA: Insufficient documentation

## 2015-08-24 LAB — HEMOGLOBIN A1C
HEMOGLOBIN A1C: 6.4 % — AB (ref <5.7)
MEAN PLASMA GLUCOSE: 137 mg/dL — AB (ref <117)

## 2015-08-24 LAB — INSULIN, RANDOM: INSULIN: 24.2 u[IU]/mL — AB (ref 2.0–19.6)

## 2015-08-24 LAB — VITAMIN D 25 HYDROXY (VIT D DEFICIENCY, FRACTURES): Vit D, 25-Hydroxy: 90 ng/mL (ref 30–100)

## 2015-08-24 NOTE — Progress Notes (Signed)
Patient ID: Tammy Hensley, female   DOB: 1941/06/17, 74 y.o.   MRN: [id number]   This very nice 74 y.o. [name] presents for 6 month follow up with Hypertension, Hyperlipidemia, T2_NIDDM and Vitamin D Deficiency.    Patient is treated for HTN since 1998 & BP has been controlled at home. Today's BP: 116/74 mmHg. Patient has had no complaints of any cardiac type chest pain, palpitations, dyspnea/orthopnea/PND, dizziness, claudication, or dependent edema.   Hyperlipidemia is controlled with diet & meds. Patient denies myalgias or other med SE's. Today's Lipids are at goal - Cholesterol 167; HDL 49; LDL Cholesterol 91; and  Triglycerides 136.    Also, the patient has history of T2_NIDDM since 2009 and has had no symptoms of reactive hypoglycemia, diabetic polys, paresthesias or visual blurring.  Today's A1c is 6.4%.   Further, the patient also has history of Vitamin D Deficiency and supplements vitamin D without any suspected side-effects. Today's vitamin D is 90.  Medication Sig  . aspirin 81 MG tablet Take 81 mg by mouth daily.    Marland Kitchen CALCIUM 600 + D  Take by mouth 2 (two) times daily.   Marland Kitchen levothyroxine  88 MCG tablet Take 1 tablet (88 mcg total) by mouth daily before breakfast.  . meloxicam 15 MG tablet Can take 1/2-1 pill as needed for pain WITH FOOD  . metFORMIN  XR 500 MG 24 hr tablet Take 1 tablet with breakfast and lunch and 2 tablets with supper for diabetes.  . Multiple Vitamins-Minerals  Take by mouth.  . pravastatin  40 MG tablet Take 1 tablet (40 mg total) by mouth daily.  Marland Kitchen triamterene-hctz (MAXZIDE) 75-50 TAKE ONE TABLET BY MOUTH ONCE DAILY FOR BLOOD PRESSURE AND  FLUID  . VITAMIN D Take 4,000 Int'l Units by mouth daily.    Allergies  Allergen Reactions  . Calan [Verapamil]     Constipation   . Effexor [Venlafaxine]   . Erythromycin   . Femara [Letrozole]   . Fosamax [Alendronate]     aching  . Ibuprofen   . Paxil [Paroxetine Hcl] Nausea Only  . Remeron [Mirtazapine]    Weight gain   . Tamoxifen     Hair loss @ low dose  . Vasotec [Enalapril] Cough    PMHx:   Past Medical History  Diagnosis Date  . Thyroid disease   . Hypertension   . Hyperlipidemia   . Diabetes mellitus without complication   . Hypothyroid   . Depression   . Cancer     left breast  . Vitamin D deficiency   . Iron deficiency anemia   . Allergy   . Insomnia    Immunization History  Administered Date(s) Administered  . Pneumococcal-Unspecified 12/29/2011  . Td 12/29/2011   Past Surgical History  Procedure Laterality Date  . Mastectomy Left 2006   FHx:    Reviewed / unchanged  SHx:    Reviewed / unchanged  Systems Review:  Constitutional: Denies fever, chills, wt changes, headaches, insomnia, fatigue, night sweats, change in appetite. Eyes: Denies redness, blurred vision, diplopia, discharge, itchy, watery eyes.  ENT: Denies discharge, congestion, post nasal drip, epistaxis, sore throat, earache, hearing loss, dental pain, tinnitus, vertigo, sinus pain, snoring.  CV: Denies chest pain, palpitations, irregular heartbeat, syncope, dyspnea, diaphoresis, orthopnea, PND, claudication or edema. Respiratory: denies cough, dyspnea, DOE, pleurisy, hoarseness, laryngitis, wheezing.  Gastrointestinal: Denies dysphagia, odynophagia, heartburn, reflux, water brash, abdominal pain or cramps, nausea, vomiting, bloating, diarrhea, constipation, hematemesis, melena, hematochezia  or  hemorrhoids. Genitourinary: Denies dysuria, frequency, urgency, nocturia, hesitancy, discharge, hematuria or flank pain. Musculoskeletal: Denies arthralgias, myalgias, stiffness, jt. swelling, pain, limping or strain/sprain.  Skin: Denies pruritus, rash, hives, warts, acne, eczema or change in skin lesion(s). Neuro: No weakness, tremor, incoordination, spasms, paresthesia or pain. Psychiatric: Denies confusion, memory loss or sensory loss. Endo: Denies change in weight, skin or hair change.  Heme/Lymph: No  excessive bleeding, bruising or enlarged lymph nodes.  Physical Exam  BP 116/74 mmHg  Pulse 72  Temp(Src) 97.3 F (36.3 C)  Resp 16  Ht 5' 2.75" (1.594 m)  Wt 148 lb 14.4 oz (67.541 kg)  BMI 26.58 kg/m2  Appears well nourished and in no distress. Eyes: PERRLA, EOMs, conjunctiva no swelling or erythema. Sinuses: No frontal/maxillary tenderness ENT/Mouth: EAC's clear, TM's nl w/o erythema, bulging. Nares clear w/o erythema, swelling, exudates. Oropharynx clear without erythema or exudates. Oral hygiene is good. Tongue normal, non obstructing. Hearing intact.  Neck: Supple. Thyroid nl. Car 2+/2+ without bruits, nodes or JVD. Chest: Respirations nl with BS clear & equal w/o rales, rhonchi, wheezing or stridor.  Cor: Heart sounds normal w/ regular rate and rhythm without sig. murmurs, gallops, clicks, or rubs. Peripheral pulses normal and equal  without edema.  Abdomen: Soft & bowel sounds normal. Non-tender w/o guarding, rebound, hernias, masses, or organomegaly.  Lymphatics: Unremarkable.  Musculoskeletal: Full ROM all peripheral extremities, joint stability, 5/5 strength, and normal gait.  Skin: Warm, dry without exposed rashes, lesions or ecchymosis apparent.  Neuro: Cranial nerves intact, reflexes equal bilaterally. Sensory-motor testing grossly intact. Tendon reflexes grossly intact.  Pysch: Alert & oriented x 3.  Insight and judgement nl & appropriate. No ideations.  Assessment and Plan:  1. Essential hypertension  - TSH  2. Hyperlipidemia  - Lipid panel  3. T2_NIDDM  - Hemoglobin A1c - Insulin, random  4. Vitamin D deficiency  - Vit D  25 hydroxy   5. Hypothyroidism  - TSH  6. Medication management  - CBC with Differential/Platelet - BASIC METABOLIC PANEL WITH GFR - Hepatic function panel - Magnesium  7. BMI 26.0-26.9,adult   Recommended regular exercise, BP monitoring, weight control, and discussed med and SE's. Recommended labs to assess and monitor  clinical status. Further disposition pending results of labs. Over 30 minutes of exam, counseling, chart review was performed

## 2015-08-31 ENCOUNTER — Encounter: Payer: Self-pay | Admitting: *Deleted

## 2015-10-02 ENCOUNTER — Other Ambulatory Visit: Payer: Self-pay

## 2015-10-02 DIAGNOSIS — Z1231 Encounter for screening mammogram for malignant neoplasm of breast: Secondary | ICD-10-CM

## 2015-11-12 ENCOUNTER — Ambulatory Visit
Admission: RE | Admit: 2015-11-12 | Discharge: 2015-11-12 | Disposition: A | Discharge status: Home / Self Care | Payer: Medicare Other | Source: Ambulatory Visit

## 2015-11-12 DIAGNOSIS — Z1231 Encounter for screening mammogram for malignant neoplasm of breast: Secondary | ICD-10-CM

## 2015-11-22 ENCOUNTER — Encounter: Payer: Self-pay | Admitting: Physician Assistant

## 2015-11-22 ENCOUNTER — Ambulatory Visit (INDEPENDENT_AMBULATORY_CARE_PROVIDER_SITE_OTHER): Payer: Medicare Other | Admitting: Physician Assistant

## 2015-11-22 VITALS — BP 124/70 | HR 86 | Temp 97.3°F | Resp 16 | Ht 62.75 in | Wt 151.8 lb

## 2015-11-22 DIAGNOSIS — M8580 Other specified disorders of bone density and structure, unspecified site: Secondary | ICD-10-CM | POA: Insufficient documentation

## 2015-11-22 DIAGNOSIS — E119 Type 2 diabetes mellitus without complications: Secondary | ICD-10-CM

## 2015-11-22 DIAGNOSIS — E785 Hyperlipidemia, unspecified: Secondary | ICD-10-CM

## 2015-11-22 DIAGNOSIS — E039 Hypothyroidism, unspecified: Secondary | ICD-10-CM

## 2015-11-22 DIAGNOSIS — D509 Iron deficiency anemia, unspecified: Secondary | ICD-10-CM

## 2015-11-22 DIAGNOSIS — Z0000 Encounter for general adult medical examination without abnormal findings: Secondary | ICD-10-CM

## 2015-11-22 DIAGNOSIS — Z79899 Other long term (current) drug therapy: Secondary | ICD-10-CM

## 2015-11-22 DIAGNOSIS — E559 Vitamin D deficiency, unspecified: Secondary | ICD-10-CM

## 2015-11-22 DIAGNOSIS — C50912 Malignant neoplasm of unspecified site of left female breast: Secondary | ICD-10-CM

## 2015-11-22 DIAGNOSIS — I10 Essential (primary) hypertension: Secondary | ICD-10-CM

## 2015-11-22 DIAGNOSIS — Z0001 Encounter for general adult medical examination with abnormal findings: Secondary | ICD-10-CM

## 2015-11-22 LAB — CBC WITH DIFFERENTIAL/PLATELET
BASOS ABS: 0 10*3/uL (ref 0.0–0.1)
BASOS PCT: 0 % (ref 0–1)
EOS ABS: 0.1 10*3/uL (ref 0.0–0.7)
Eosinophils Relative: 1 % (ref 0–5)
HCT: 42.4 % (ref 36.0–46.0)
HEMOGLOBIN: 14.5 g/dL (ref 12.0–15.0)
Lymphocytes Relative: 35 % (ref 12–46)
Lymphs Abs: 1.8 10*3/uL (ref 0.7–4.0)
MCH: 26.7 pg (ref 26.0–34.0)
MCHC: 34.2 g/dL (ref 30.0–36.0)
MCV: 77.9 fL — ABNORMAL LOW (ref 78.0–100.0)
MONOS PCT: 7 % (ref 3–12)
MPV: 9.6 fL (ref 8.6–12.4)
Monocytes Absolute: 0.4 10*3/uL (ref 0.1–1.0)
NEUTROS ABS: 2.9 10*3/uL (ref 1.7–7.7)
NEUTROS PCT: 57 % (ref 43–77)
PLATELETS: 346 10*3/uL (ref 150–400)
RBC: 5.44 MIL/uL — AB (ref 3.87–5.11)
RDW: 14.3 % (ref 11.5–15.5)
WBC: 5.1 10*3/uL (ref 4.0–10.5)

## 2015-11-22 MED ORDER — LEVOTHYROXINE SODIUM 88 MCG PO TABS: 88.0000 ug | ORAL_TABLET | Freq: Every day | ORAL | Status: DC

## 2015-11-22 NOTE — Patient Instructions (Signed)

## 2015-11-22 NOTE — Progress Notes (Signed)
Complete Physical  Assessment and Plan: 1. Essential hypertension - continue medications, DASH diet, exercise and monitor at home. Call if greater than 130/80.  - CBC with Differential/Platelet - BASIC METABOLIC PANEL WITH GFR - Hepatic function panel  2. T2_NIDDM Discussed general issues about diabetes pathophysiology and management., Educational material distributed., Suggested low cholesterol diet., Encouraged aerobic exercise., Discussed foot care., Reminded to get yearly retinal exam. - Hemoglobin A1c - Insulin, fasting  3. Hypothyroidism, unspecified hypothyroidism type Hypothyroidism-check TSH level, continue medications the same, reminded to take on an empty stomach 30-63mins before food.  - TSH  4. Hyperlipidemia -continue medications, check lipids, decrease fatty foods, increase activity.  - Lipid panel  5. Malignant neoplasm of left female breast, unspecified site of breast ([place]) Get MGM  6. Vitamin D deficiency - VITAMIN D 25 Hydroxy (Vit-D Deficiency, Fractures)  7. Medication management - Magnesium  8. Iron deficiency anemia Check CBC  9. Encounter for general adult medical examination with abnormal findings  10. Osteopenia Osteopenia- get dexa, continue Vit D and Ca, weight bearing exercises  Discussed med's effects and SE's. Screening labs and tests as requested with regular follow-up as recommended. Over 40 minutes of exam, counseling, chart review, and complex, high level critical decision making was performed this visit.   HPI  74 y.o. AA female  presents for a complete physical.  Her blood pressure has been controlled at home, today their BP is BP: 124/70 mmHg She does workout. She denies chest pain, shortness of breath, dizziness.  She is on cholesterol medication and denies myalgias. Her cholesterol is at goal. The cholesterol last visit was:   Lab Results  Component Value Date   CHOL 167 08/23/2015   HDL 49 08/23/2015   LDLCALC 91 08/23/2015    TRIG 136 08/23/2015   CHOLHDL 3.4 08/23/2015   She has been working on diet and exercise for diabetes without complications, she is on bASA, she is on metformin, she is not on ACE/ARB due to allergy and denies paresthesia of the feet, polydipsia, polyuria and visual disturbances. Last A1C in the office was:  Lab Results  Component Value Date   HGBA1C 6.4* 08/23/2015   Patient is on Vitamin D supplement.   Lab Results  Component Value Date   VD25OH 90 08/23/2015     She is on thyroid medication. Her medication was not changed last visit.   Lab Results  Component Value Date   TSH 1.025 08/23/2015   She has history of left breast cancer in 2007, now released from Dr. Virgie Dad care.   Current Medications:  Current Outpatient Prescriptions on File Prior to Visit  Medication Sig Dispense Refill  . aspirin 81 MG tablet Take 81 mg by mouth daily.      . Calcium Carbonate-Vitamin D (CALCIUM 600 + D PO) Take by mouth 2 (two) times daily.     . meloxicam (MOBIC) 15 MG tablet Can take 1/2-1 pill as needed for pain WITH FOOD 30 tablet 3  . metFORMIN (GLUCOPHAGE XR) 500 MG 24 hr tablet Take 1 tablet with breakfast and lunch and 2 tablets with supper for diabetes. 360 tablet 1  . Multiple Vitamins-Minerals (MULTIVITAMIN PO) Take by mouth.    . pravastatin (PRAVACHOL) 40 MG tablet Take 1 tablet (40 mg total) by mouth daily. 90 tablet 1  . triamterene-hydrochlorothiazide (MAXZIDE) 75-50 MG per tablet TAKE ONE TABLET BY MOUTH ONCE DAILY FOR BLOOD PRESSURE AND  FLUID 90 tablet 3  . VITAMIN D, ERGOCALCIFEROL, PO  Take 4,000 Int'l Units by mouth daily.      No current facility-administered medications on file prior to visit.   Health Maintenance:   Immunization History  Administered Date(s) Administered  . Pneumococcal-Unspecified 12/29/2011  . Td 12/29/2011   Tetanus: 2013 Pneumovax: 2013 Prevnar 13: N/A- out of in the office Flu vaccine: declines Zostavax: N/A LMP: remote Pap:  remote 3D MGM: 10/2015 , CAT C DEXA: 2011, osteopenia Colonoscopy: 2014 EGD: N/a  Last Dental Exam: Dr. Lavone Neri, 02/2015 Last Eye Exam: Dr. Delman Cheadle 2016 Patient Care Team: [name], MD as PCP - General (Internal Medicine) [name], MD as Consulting Physician (Gastroenterology) [name], MD as Consulting Physician (Ophthalmology) Chauncey Cruel, MD as Consulting Physician (Oncology)  Allergies:  Allergies  Allergen Reactions  . Calan [Verapamil]     Constipation   . Effexor [Venlafaxine]   . Erythromycin   . Femara [Letrozole]   . Fosamax [Alendronate]     aching  . Ibuprofen   . Paxil [Paroxetine Hcl] Nausea Only  . Remeron [Mirtazapine]     Weight gain   . Tamoxifen     Hair loss @ low dose  . Vasotec [Enalapril] Cough   Medical History:  Past Medical History  Diagnosis Date  . Thyroid disease   . Hypertension   . Hyperlipidemia   . Diabetes mellitus without complication ([place])   . Hypothyroid   . Depression   . Cancer ([place])     left breast  . Vitamin D deficiency   . Iron deficiency anemia   . Allergy   . Insomnia    Surgical History:  Past Surgical History  Procedure Laterality Date  . Mastectomy Left 2006   Family History:  Family History  Problem Relation Age of Onset  . Heart disease Mother   . Heart attack Mother   . Cancer Father   . Heart attack Father    Social History:  Social History  Substance Use Topics  . Smoking status: Never Smoker   . Smokeless tobacco: None  . Alcohol Use: No    Review of Systems: Review of Systems  Constitutional: Negative.   HENT: Negative.   Eyes: Negative.   Respiratory: Negative.   Cardiovascular: Negative.   Gastrointestinal: Negative.   Genitourinary: Negative.   Musculoskeletal: Negative.   Skin: Negative.   Neurological: Negative.   Endo/Heme/Allergies: Negative.   Psychiatric/Behavioral: Negative.     Physical Exam: Estimated body mass index is 27.1 kg/(m^2) as  calculated from the following:   Height as of this encounter: 5' 2.75" (1.594 m).   Weight as of this encounter: 151 lb 12.8 oz (68.856 kg). BP 124/70 mmHg  Pulse 86  Temp(Src) 97.3 F (36.3 C) (Temporal)  Resp 16  Ht 5' 2.75" (1.594 m)  Wt 151 lb 12.8 oz (68.856 kg)  BMI 27.10 kg/m2  SpO2 92% General Appearance: Well nourished, in no apparent distress.  Eyes: PERRLA, EOMs, conjunctiva no swelling or erythema, normal fundi and vessels.  Sinuses: No Frontal/maxillary tenderness  ENT/Mouth: Ext aud canals clear, normal light reflex with TMs without erythema, bulging. Good dentition. No erythema, swelling, or exudate on post pharynx. Tonsils not swollen or erythematous. Hearing normal.  Neck: Supple, thyroid normal. No bruits  Respiratory: Respiratory effort normal, BS equal bilaterally without rales, rhonchi, wheezing or stridor.  Cardio: RRR without murmurs, rubs or gallops. Brisk peripheral pulses without edema.  Chest: symmetric, with normal excursions and percussion.  Breasts: declines Abdomen: Soft, nontender, no guarding, rebound, hernias,  masses, or organomegaly.  Lymphatics: Non tender without lymphadenopathy.  Genitourinary: defer Musculoskeletal: Full ROM all peripheral extremities,5/5 strength, and normal gait.  Skin: Warm, dry without rashes, lesions, ecchymosis. Neuro: Cranial nerves intact, reflexes equal bilaterally. Normal muscle tone, no cerebellar symptoms. Sensation intact.  Psych: Awake and oriented X 3, normal affect, Insight and Judgment appropriate.   EKG: defer AORTA SCAN: defer  Vicie Mutters 9:20 AM [facility]

## 2015-11-23 LAB — LIPID PANEL
CHOL/HDL RATIO: 3.6 ratio (ref <=5.0)
CHOLESTEROL: 181 mg/dL (ref 125–200)
HDL: 50 mg/dL (ref >=46)
LDL Cholesterol: 107 mg/dL (ref <130)
Triglycerides: 119 mg/dL (ref <150)
VLDL: 24 mg/dL (ref <30)

## 2015-11-23 LAB — BASIC METABOLIC PANEL WITH GFR
BUN: 11 mg/dL (ref 7–25)
CO2: 30 mmol/L (ref 20–31)
CREATININE: 0.66 mg/dL (ref 0.60–0.93)
Calcium: 9.8 mg/dL (ref 8.6–10.4)
Chloride: 99 mmol/L (ref 98–110)
GFR, EST NON AFRICAN AMERICAN: 87 mL/min (ref >=60)
GFR, Est African American: >89 mL/min (ref >=60)
Glucose, Bld: 103 mg/dL — ABNORMAL HIGH (ref 65–99)
Potassium: 3.8 mmol/L (ref 3.5–5.3)
SODIUM: 139 mmol/L (ref 135–146)

## 2015-11-23 LAB — INSULIN, FASTING: Insulin fasting, serum: 11.6 u[IU]/mL (ref 2.0–19.6)

## 2015-11-23 LAB — MAGNESIUM: Magnesium: 2 mg/dL (ref 1.5–2.5)

## 2015-11-23 LAB — HEPATIC FUNCTION PANEL
ALT: 11 U/L (ref 6–29)
AST: 18 U/L (ref 10–35)
Albumin: 4.2 g/dL (ref 3.6–5.1)
Alkaline Phosphatase: 85 U/L (ref 33–130)
BILIRUBIN DIRECT: 0.1 mg/dL (ref <=0.2)
BILIRUBIN INDIRECT: 0.4 mg/dL (ref 0.2–1.2)
Total Bilirubin: 0.5 mg/dL (ref 0.2–1.2)
Total Protein: 7.1 g/dL (ref 6.1–8.1)

## 2015-11-23 LAB — VITAMIN D 25 HYDROXY (VIT D DEFICIENCY, FRACTURES): Vit D, 25-Hydroxy: 81 ng/mL (ref 30–100)

## 2015-11-23 LAB — TSH: TSH: 0.847 u[IU]/mL (ref 0.350–4.500)

## 2015-11-23 LAB — HEMOGLOBIN A1C
HEMOGLOBIN A1C: 6.1 % — AB (ref <5.7)
Mean Plasma Glucose: 128 mg/dL — ABNORMAL HIGH (ref <117)

## 2016-02-27 ENCOUNTER — Encounter: Payer: Self-pay | Admitting: Internal Medicine

## 2016-04-02 ENCOUNTER — Encounter: Payer: Self-pay | Admitting: Internal Medicine

## 2016-04-02 ENCOUNTER — Ambulatory Visit (INDEPENDENT_AMBULATORY_CARE_PROVIDER_SITE_OTHER): Payer: Medicare Other | Admitting: Internal Medicine

## 2016-04-02 VITALS — BP 134/82 | HR 88 | Temp 97.4°F | Resp 16 | Ht 62.5 in | Wt 151.0 lb

## 2016-04-02 DIAGNOSIS — E039 Hypothyroidism, unspecified: Secondary | ICD-10-CM

## 2016-04-02 DIAGNOSIS — E559 Vitamin D deficiency, unspecified: Secondary | ICD-10-CM

## 2016-04-02 DIAGNOSIS — Z136 Encounter for screening for cardiovascular disorders: Secondary | ICD-10-CM | POA: Diagnosis not present

## 2016-04-02 DIAGNOSIS — E785 Hyperlipidemia, unspecified: Secondary | ICD-10-CM

## 2016-04-02 DIAGNOSIS — E119 Type 2 diabetes mellitus without complications: Secondary | ICD-10-CM | POA: Diagnosis not present

## 2016-04-02 DIAGNOSIS — Z1212 Encounter for screening for malignant neoplasm of rectum: Secondary | ICD-10-CM

## 2016-04-02 DIAGNOSIS — I10 Essential (primary) hypertension: Secondary | ICD-10-CM

## 2016-04-02 DIAGNOSIS — Z79899 Other long term (current) drug therapy: Secondary | ICD-10-CM | POA: Diagnosis not present

## 2016-04-02 LAB — CBC WITH DIFFERENTIAL/PLATELET
BASOS ABS: 0 {cells}/uL (ref 0–200)
Basophils Relative: 0 %
EOS ABS: 49 {cells}/uL (ref 15–500)
EOS PCT: 1 %
HCT: 42.1 % (ref 35.0–45.0)
Hemoglobin: 14.1 g/dL (ref 11.7–15.5)
Lymphocytes Relative: 46 %
Lymphs Abs: 2254 cells/uL (ref 850–3900)
MCH: 25.7 pg — AB (ref 27.0–33.0)
MCHC: 33.5 g/dL (ref 32.0–36.0)
MCV: 76.7 fL — AB (ref 80.0–100.0)
MONOS PCT: 7 %
MPV: 9.7 fL (ref 7.5–12.5)
Monocytes Absolute: 343 cells/uL (ref 200–950)
NEUTROS PCT: 46 %
Neutro Abs: 2254 cells/uL (ref 1500–7800)
Platelets: 370 10*3/uL (ref 140–400)
RBC: 5.49 MIL/uL — ABNORMAL HIGH (ref 3.80–5.10)
RDW: 14.9 % (ref 11.0–15.0)
WBC: 4.9 10*3/uL (ref 3.8–10.8)

## 2016-04-02 LAB — BASIC METABOLIC PANEL WITH GFR
BUN: 9 mg/dL (ref 7–25)
CO2: 29 mmol/L (ref 20–31)
Calcium: 10.1 mg/dL (ref 8.6–10.4)
Chloride: 97 mmol/L — ABNORMAL LOW (ref 98–110)
Creat: 0.72 mg/dL (ref 0.60–0.93)
GFR, EST NON AFRICAN AMERICAN: 83 mL/min (ref >=60)
GFR, Est African American: >89 mL/min (ref >=60)
GLUCOSE: 74 mg/dL (ref 65–99)
POTASSIUM: 4.2 mmol/L (ref 3.5–5.3)
Sodium: 137 mmol/L (ref 135–146)

## 2016-04-02 LAB — HEPATIC FUNCTION PANEL
ALK PHOS: 79 U/L (ref 33–130)
ALT: 11 U/L (ref 6–29)
AST: 18 U/L (ref 10–35)
Albumin: 4.5 g/dL (ref 3.6–5.1)
BILIRUBIN INDIRECT: 0.5 mg/dL (ref 0.2–1.2)
Bilirubin, Direct: 0.1 mg/dL (ref <=0.2)
Total Bilirubin: 0.6 mg/dL (ref 0.2–1.2)
Total Protein: 7.2 g/dL (ref 6.1–8.1)

## 2016-04-02 LAB — LIPID PANEL
Cholesterol: 218 mg/dL — ABNORMAL HIGH (ref 125–200)
HDL: 49 mg/dL (ref >=46)
LDL CALC: 142 mg/dL — AB (ref <130)
Total CHOL/HDL Ratio: 4.4 Ratio (ref <=5.0)
Triglycerides: 134 mg/dL (ref <150)
VLDL: 27 mg/dL (ref <30)

## 2016-04-02 LAB — MAGNESIUM: Magnesium: 2 mg/dL (ref 1.5–2.5)

## 2016-04-02 LAB — TSH: TSH: 0.38 m[IU]/L — AB

## 2016-04-02 NOTE — Patient Instructions (Signed)

## 2016-04-02 NOTE — Progress Notes (Signed)
Patient ID: Tammy Hensley, female   DOB: Apr 13, 1941, 75 y.o.   MRN: [id number]  [facility]IC[facility]                   [name], M.D.    Uvaldo Bristle. Silverio Lay, P.A.-C      Starlyn Skeans, P.A.-C   [facility]                [address] [place], [place] [id number] Telephone [phone] Telefax [phone]  __________________________________________________________________________  Comprehensive Evaluation &  Examination     This very nice 75 y.o.  [name] presents for a comprehensive evaluation and management of multiple medical co-morbidities.  Patient has been followed for HTN, T2_NIDDM, Hyperlipidemia and Vitamin D Deficiency.      HTN predates since 1995. Patient's BP has been controlled at home and patient denies any cardiac symptoms as chest pain, palpitations, shortness of breath, dizziness or ankle swelling. Today's BP: 134/82 mmHg      Patient's hyperlipidemia is not controlled with diet and medications. Patient denies myalgias or other medication SE's. Last lipids were not at goal with Cholesterol 181; HDL 50; elevated LDL 107; Trig 119 on 11/22/2015.     Patient has T2_NIDDM predating since 2009 and patient denies reactive hypoglycemic symptoms, visual blurring, diabetic polys, or paresthesias. She reports FBG's range between 90-110 mg%.  Last A1c was 6.1% on 11/22/2015.     Finally, patient has history of Vitamin D Deficiency and last Vitamin D was 81 on 11/22/2015.    Medication Sig  . aspirin 81 MG Take 81 mg by mouth daily.    . Calcium-Vit D  Take by mouth 2 (two) times daily.   . Levothyroxine 88 MCG  Take 1 tablet (88 mcg total) by mouth daily before breakfast.  . meloxicam  15 MG Can take 1/2-1 pill as needed for pain WITH FOOD  . Multiple Vitamins-Minerals  Take by mouth.  . pravastatin  40 MG Take 1 tablet (40 mg total) by mouth daily.  Marland Kitchen triamterene-hctz75-50  TAKE ONE TABLET BY MOUTH  ONCE DAILY FOR BLOOD PRESSURE AND  FLUID  . VITAMIN D Take 4,000 Int'l Units by mouth daily.   . metFORMIN-XR 500 MG 24  Take 1 tablet with breakfast and lunch and 2 tablets with supper for diabetes.   Allergies  Allergen Reactions  . Calan [Verapamil]     Constipation   . Effexor [Venlafaxine]   . Erythromycin   . Femara [Letrozole]   . Fosamax [Alendronate]     aching  . Ibuprofen   . Paxil [Paroxetine Hcl] Nausea Only  . Remeron [Mirtazapine]     Weight gain   . Tamoxifen     Hair loss @ low dose  . Vasotec [Enalapril] Cough   Past Medical History  Diagnosis Date  . Thyroid disease   . Hypertension   . Hyperlipidemia   . Diabetes mellitus without complication ([place])   . Hypothyroid   . Depression   . Cancer ([place])     left breast  . Vitamin D deficiency   . Iron deficiency anemia   . Allergy   . Insomnia    Health Maintenance  Topic Date Due  . COLONOSCOPY  06/25/1991  . ZOSTAVAX  06/24/2001  . PNA vac Low Risk Adult (2 of 2 - PCV13) 12/28/2012  . FOOT EXAM  02/07/2016  . URINE MICROALBUMIN  02/07/2016  . HEMOGLOBIN A1C  05/22/2016  . INFLUENZA VACCINE  06/23/2016  . OPHTHALMOLOGY EXAM  07/11/2016  . MAMMOGRAM  11/11/2017  . TETANUS/TDAP  12/28/2021  . DEXA SCAN  Completed   Immunization History  Administered Date(s) Administered  . Pneumococcal-Unspecified 12/29/2011  . Td 12/29/2011   Past Surgical History  Procedure Laterality Date  . Mastectomy Left 2006   Family History  Problem Relation Age of Onset  . Heart disease Mother   . Heart attack Mother   . Cancer Father   . Heart attack Father    Social History  Substance Use Topics  . Smoking status: Never Smoker   . Smokeless tobacco: None  . Alcohol Use: No    ROS Constitutional: Denies fever, chills, weight loss/gain, headaches, insomnia,  night sweats, and change in appetite. Does c/o fatigue. Eyes: Denies redness, blurred vision, diplopia, discharge, itchy, watery eyes.  ENT: Denies  discharge, congestion, post nasal drip, epistaxis, sore throat, earache, hearing loss, dental pain, Tinnitus, Vertigo, Sinus pain, snoring.  Cardio: Denies chest pain, palpitations, irregular heartbeat, syncope, dyspnea, diaphoresis, orthopnea, PND, claudication, edema Respiratory: denies cough, dyspnea, DOE, pleurisy, hoarseness, laryngitis, wheezing.  Gastrointestinal: Denies dysphagia, heartburn, reflux, water brash, pain, cramps, nausea, vomiting, bloating, diarrhea, constipation, hematemesis, melena, hematochezia, jaundice, hemorrhoids Genitourinary: Denies dysuria, frequency, urgency, nocturia, hesitancy, discharge, hematuria, flank pain Breast: Breast lumps, nipple discharge, bleeding.  Musculoskeletal: Denies arthralgia, myalgia, stiffness, Jt. Swelling, pain, limp, and strain/sprain. Denies falls. Skin: Denies puritis, rash, hives, warts, acne, eczema, changing in skin lesion Neuro: No weakness, tremor, incoordination, spasms, paresthesia, pain Psychiatric: Denies confusion, memory loss, sensory loss. Denies Depression. Endocrine: Denies change in weight, skin, hair change, nocturia, and paresthesia, diabetic polys, visual blurring, hyper / hypo glycemic episodes.  Heme/Lymph: No excessive bleeding, bruising, enlarged lymph nodes.  Physical Exam  BP 134/82 mmHg  Pulse 88  Temp(Src) 97.4 F (36.3 C)  Resp 16  Ht 5' 2.5" (1.588 m)  Wt 151 lb (68.493 kg)  BMI 27.16 kg/m2  General Appearance: Well nourished and in no apparent distress.  Eyes: PERRLA, EOMs, conjunctiva no swelling or erythema, normal fundi and vessels. Sinuses: No frontal/maxillary tenderness ENT/Mouth: EACs patent / TMs  nl. Nares clear without erythema, swelling, mucoid exudates. Oral hygiene is good. No erythema, swelling, or exudate. Tongue normal, non-obstructing. Tonsils not swollen or erythematous. Hearing normal.  Neck: Supple, thyroid normal. No bruits, nodes or JVD. Respiratory: Respiratory effort normal.   BS equal and clear bilateral without rales, rhonci, wheezing or stridor. Cardio: Heart sounds are normal with regular rate and rhythm and no murmurs, rubs or gallops. Peripheral pulses are normal and equal bilaterally without edema. No aortic or femoral bruits. Chest: symmetric with normal excursions and percussion. Breasts: Symmetric, without lumps, nipple discharge, retractions, or fibrocystic changes.  Abdomen: Flat, soft with bowel sounds active. Nontender, no guarding, rebound, hernias, masses, or organomegaly.  Lymphatics: Non tender without lymphadenopathy.  Musculoskeletal: Full ROM all peripheral extremities, joint stability, 5/5 strength, and normal gait. Skin: Warm and dry without rashes, lesions, cyanosis, clubbing or  ecchymosis.  Neuro: Cranial nerves intact, reflexes equal bilaterally. Normal muscle tone, no cerebellar symptoms. Sensation intact to touch, Vibratory & Monofilament testing to the toes bilaterally.  Pysch: Alert and oriented X 3, normal affect, Insight and Judgment appropriate.   Assessment and Plan  1. Essential hypertension  - EKG 12-Lead - [place], RETROPERITNL ABD,  LTD - TSH  2. Hyperlipidemia  - Lipid panel -  TSH  3. T2_NIDDM  - Microalbumin / creatinine urine ratio - HM DIABETES FOOT EXAM - LOW EXTREMITY NEUR EXAM DOCUM - Hemoglobin A1c - Insulin, random  4. Vitamin D deficiency  - VITAMIN D 25 Hydroxy   5. Hypothyroidism  - TSH  6. Screening for rectal cancer  - POC Hemoccult Bld/Stl   7. Medication management  - Urinalysis, Routine w reflex microscopic  - CBC with Differential/Platelet - BASIC METABOLIC PANEL WITH GFR - Hepatic function panel - Magnesium  8. Screening for AAA (aortic abdominal aneurysm)   9. Screening for ischemic heart disease   Continue prudent diet as discussed, weight control, BP monitoring, regular exercise, and medications. Discussed med's effects and SE's. Screening labs and tests as requested with  regular follow-up as recommended. Over 40 minutes of exam, counseling, chart review and high complex critical decision making was performed.

## 2016-04-03 ENCOUNTER — Other Ambulatory Visit: Payer: Self-pay | Admitting: Internal Medicine

## 2016-04-03 DIAGNOSIS — E0590 Thyrotoxicosis, unspecified without thyrotoxic crisis or storm: Secondary | ICD-10-CM

## 2016-04-03 LAB — VITAMIN D 25 HYDROXY (VIT D DEFICIENCY, FRACTURES): Vit D, 25-Hydroxy: 78 ng/mL (ref 30–100)

## 2016-04-03 LAB — URINALYSIS, ROUTINE W REFLEX MICROSCOPIC
BILIRUBIN URINE: NEGATIVE
GLUCOSE, UA: NEGATIVE
HGB URINE DIPSTICK: NEGATIVE
KETONES UR: NEGATIVE
Leukocytes, UA: NEGATIVE
Nitrite: NEGATIVE
PH: 7.5 (ref 5.0–8.0)
PROTEIN: NEGATIVE
Specific Gravity, Urine: 1.015 (ref 1.001–1.035)

## 2016-04-03 LAB — INSULIN, RANDOM: Insulin: 17.6 u[IU]/mL (ref 2.0–19.6)

## 2016-04-03 LAB — MICROALBUMIN / CREATININE URINE RATIO
CREATININE, URINE: 76 mg/dL (ref 20–320)
MICROALB UR: 1 mg/dL
MICROALB/CREAT RATIO: 13 ug/mg{creat} (ref <30)

## 2016-04-03 LAB — HEMOGLOBIN A1C
HEMOGLOBIN A1C: 5.8 % — AB (ref <5.7)
MEAN PLASMA GLUCOSE: 120 mg/dL

## 2016-04-03 MED ORDER — ATORVASTATIN CALCIUM 80 MG PO TABS: ORAL_TABLET | ORAL | Status: DC

## 2016-04-12 ENCOUNTER — Encounter: Payer: Self-pay | Admitting: *Deleted

## 2016-05-14 ENCOUNTER — Encounter: Payer: Self-pay | Admitting: Internal Medicine

## 2016-05-14 ENCOUNTER — Ambulatory Visit (INDEPENDENT_AMBULATORY_CARE_PROVIDER_SITE_OTHER): Payer: Medicare Other | Admitting: Internal Medicine

## 2016-05-14 VITALS — BP 138/72 | HR 84 | Temp 98.0°F | Resp 18 | Ht 62.5 in | Wt 151.0 lb

## 2016-05-14 DIAGNOSIS — M8580 Other specified disorders of bone density and structure, unspecified site: Secondary | ICD-10-CM

## 2016-05-14 DIAGNOSIS — E039 Hypothyroidism, unspecified: Secondary | ICD-10-CM

## 2016-05-14 DIAGNOSIS — Z0000 Encounter for general adult medical examination without abnormal findings: Secondary | ICD-10-CM | POA: Diagnosis not present

## 2016-05-14 DIAGNOSIS — E559 Vitamin D deficiency, unspecified: Secondary | ICD-10-CM

## 2016-05-14 DIAGNOSIS — D509 Iron deficiency anemia, unspecified: Secondary | ICD-10-CM

## 2016-05-14 DIAGNOSIS — E785 Hyperlipidemia, unspecified: Secondary | ICD-10-CM

## 2016-05-14 DIAGNOSIS — E119 Type 2 diabetes mellitus without complications: Secondary | ICD-10-CM

## 2016-05-14 DIAGNOSIS — I10 Essential (primary) hypertension: Secondary | ICD-10-CM | POA: Diagnosis not present

## 2016-05-14 DIAGNOSIS — Z79899 Other long term (current) drug therapy: Secondary | ICD-10-CM | POA: Diagnosis not present

## 2016-05-14 DIAGNOSIS — C50912 Malignant neoplasm of unspecified site of left female breast: Secondary | ICD-10-CM | POA: Diagnosis not present

## 2016-05-14 LAB — TSH: TSH: 0.85 m[IU]/L

## 2016-05-14 MED ORDER — EZETIMIBE 10 MG PO TABS: 10.0000 mg | ORAL_TABLET | Freq: Every day | ORAL | Status: DC

## 2016-05-14 NOTE — Progress Notes (Signed)
MEDICARE ANNUAL WELLNESS VISIT AND FOLLOW UP  Assessment:    1. Medicare annual wellness visit, subsequent -due next year  2. Essential hypertension -well controlled currently -DASH diet -exercise as tolerated -continue to monitor  3. Hypothyroidism, unspecified hypothyroidism type -cont levothyroxine -dose adjust if necessary - TSH  4. T2_NIDDM -cont diet and exercise -monitor CBGs  5. Osteopenia -recommended increased impact exercises -cont Vit D and calcium supplement  6. Hyperlipidemia -stop lipitor due to statin intolerance - ezetimibe (ZETIA) 10 MG tablet; Take 1 tablet (10 mg total) by mouth daily.  Dispense: 30 tablet; Refill: 11  7. Vitamin D deficiency -cont Vit D   8. Iron deficiency anemia -Cont slow release iron  9. Malignant neoplasm of left female breast, unspecified site of breast ([place]) -followed by oncology  10. Medication management -cont quarterly lab management  Over 30 minutes of exam, counseling, chart review, and critical decision making was performed  Future Appointments Date Time Provider [place]  07/09/2016 9:30 AM Starlyn Skeans, PA-C [facility] None  10/13/2016 10:45 AM [name], MD [facility] None  05/11/2017 2:00 PM [name], MD [facility] None    Plan:   During the course of the visit the patient was educated and counseled about appropriate screening and preventive services including:    Pneumococcal vaccine   Influenza vaccine  Td vaccine  Prevnar 13  Screening electrocardiogram  Screening mammography  Bone densitometry screening  Colorectal cancer screening  Diabetes screening  Glaucoma screening  Nutrition counseling   Advanced directives: given info/requested copies   Subjective:   Tammy Hensley is a 75 y.o. female who presents for Medicare Annual Wellness Visit and 3 month follow up on hypertension, prediabetes, hyperlipidemia, vitamin D def.   Her blood pressure has  been controlled at home, today their BP is BP: 138/72 mmHg She does not workout. She denies chest pain, shortness of breath, dizziness.  She hasn't been able to exercise as she has been taking her girlfriend to appointments  She is on cholesterol medication and denies myalgias. Her cholesterol is at goal. The cholesterol last visit was:   Lab Results  Component Value Date   CHOL 218* 04/02/2016   HDL 49 04/02/2016   LDLCALC 142* 04/02/2016   TRIG 134 04/02/2016   CHOLHDL 4.4 04/02/2016  She was recently started on lipitor 1/2 tablet daily.  She reports that she is taking a 1/4 tablet because she was having memory problems, she was feeling foggy, and it was causing her to not participate correctly in conversations.  She is taking a 20 mgs.  She reports that she still doesn't just feel like herself.  She is having some swelling in her ankles as well.      Patient is working on a healthy diet.  She is not exercising.  No symptoms of diabetes reported.   :  Lab Results  Component Value Date   HGBA1C 5.8* 04/02/2016   Last GFR Lab Results  Component Value Date   GFRNONAA 83 04/02/2016   Lab Results  Component Value Date   GFRAA >89 04/02/2016   Patient is on Vitamin D supplement. Lab Results  Component Value Date   VD25OH 78 04/02/2016      Medication Review Current Outpatient Prescriptions on File Prior to Visit  Medication Sig Dispense Refill  . aspirin 81 MG tablet Take 81 mg by mouth daily.      . Calcium Carbonate-Vitamin D (CALCIUM 600 + D PO) Take by mouth 2 (two)  times daily.     Marland Kitchen levothyroxine (SYNTHROID, LEVOTHROID) 88 MCG tablet Take 1 tablet (88 mcg total) by mouth daily before breakfast. 90 tablet 1  . meloxicam (MOBIC) 15 MG tablet Can take 1/2-1 pill as needed for pain WITH FOOD 30 tablet 3  . Multiple Vitamins-Minerals (MULTIVITAMIN PO) Take by mouth.    . triamterene-hydrochlorothiazide (MAXZIDE) 75-50 MG per tablet TAKE ONE TABLET BY MOUTH ONCE DAILY FOR BLOOD  PRESSURE AND  FLUID 90 tablet 3  . VITAMIN D, ERGOCALCIFEROL, PO Take 4,000 Int'l Units by mouth daily.     . metFORMIN (GLUCOPHAGE XR) 500 MG 24 hr tablet Take 1 tablet with breakfast and lunch and 2 tablets with supper for diabetes. 360 tablet 1   No current facility-administered medications on file prior to visit.    Allergies: Allergies  Allergen Reactions  . Calan [Verapamil]     Constipation   . Effexor [Venlafaxine]   . Erythromycin   . Femara [Letrozole]   . Fosamax [Alendronate]     aching  . Ibuprofen   . Paxil [Paroxetine Hcl] Nausea Only  . Remeron [Mirtazapine]     Weight gain   . Tamoxifen     Hair loss @ low dose  . Vasotec [Enalapril] Cough    Current Problems (verified) has Hypothyroid; Hypertension; Hyperlipidemia; Vitamin D deficiency; Iron deficiency anemia; Breast cancer, left breast ([place]); Medication management; T2_NIDDM; Medicare annual wellness visit, subsequent; and Osteopenia on her problem list.  Screening Tests Immunization History  Administered Date(s) Administered  . Pneumococcal-Unspecified 12/29/2011  . Td 12/29/2011    Preventative care: Last colonoscopy: 2017 Last mammogram: 10/2015 [id number]  Prior vaccinations: TD or Tdap: 2013  Influenza: Declined  Pneumococcal: 2013 Prevnar13: Declined Shingles/Zostavax: Declined  Names of Other Physician/Practitioners you currently use: 1. [facility]- here for primary care 2. Dr. Delman Cheadle , eye doctor, last visit 2017 3. [facility], dentist, last visit  Patient Care Team: [name], MD as PCP - General (Internal Medicine) [name], MD as Consulting Physician (Gastroenterology) [name], MD as Consulting Physician (Ophthalmology) Chauncey Cruel, MD as Consulting Physician (Oncology)  Surgical: She  has past surgical history that includes Mastectomy (Left, 2006). Family  Patient's family history includes Cancer in her father; Heart  attack in her father and mother; Heart disease in her mother. Social history  She reports that she has never smoked. She does not have any smokeless tobacco history on file. She reports that she does not drink alcohol. Her drug history is not on file.  MEDICARE WELLNESS OBJECTIVES: Physical activity: Current Exercise Habits: The patient does not participate in regular exercise at present Cardiac risk factors: Cardiac Risk Factors include: advanced age (>66men, >76 women);diabetes mellitus;dyslipidemia;hypertension;obesity (BMI >30kg/m2) Depression/mood screen:   Depression screen [facility] 2/9 05/14/2016  Decreased Interest 0  Down, Depressed, Hopeless 0  PHQ - 2 Score 0    ADLs:  In your present state of health, do you have any difficulty performing the following activities: 05/14/2016 04/02/2016  Hearing? N N  Vision? N N  Difficulty concentrating or making decisions? N N  Walking or climbing stairs? N N  Dressing or bathing? N N  Doing errands, shopping? N N  Preparing Food and eating ? N -  Using the Toilet? N -  In the past six months, have you accidently leaked urine? N -  Do you have problems with loss of bowel control? N -  Managing your Medications? N -  Managing your  Finances? N -  Housekeeping or managing your Housekeeping? N -     Cognitive Testing  Alert? Yes  Normal Appearance?Yes  Oriented to person? Yes  Place? Yes   Time? Yes  Recall of three objects?  Yes  Can perform simple calculations? Yes  Displays appropriate judgment?Yes  Can read the correct time from a watch face?Yes  EOL planning: Does patient have an advance directive?: No Would patient like information on creating an advanced directive?: No - patient declined information   Objective:   Today's Vitals   05/14/16 1100  BP: 138/72  Pulse: 84  Temp: 98 F (36.7 C)  TempSrc: Temporal  Resp: 18  Height: 5' 2.5" (1.588 m)  Weight: 151 lb (68.493 kg)   Body mass index is 27.16 kg/(m^2).  Wt  Readings from Last 3 Encounters:  05/14/16 151 lb (68.493 kg)  04/02/16 151 lb (68.493 kg)  11/22/15 151 lb 12.8 oz (68.856 kg)   General appearance: alert, no distress, WD/WN,  female HEENT: normocephalic, sclerae anicteric, TMs pearly, nares patent, no discharge or erythema, pharynx normal Oral cavity: MMM, no lesions Neck: supple, no lymphadenopathy, no thyromegaly, no masses Heart: RRR, normal S1, S2, no murmurs Lungs: CTA bilaterally, no wheezes, rhonchi, or rales Abdomen: +bs, soft, non tender, non distended, no masses, no hepatomegaly, no splenomegaly Musculoskeletal: nontender, no swelling, no obvious deformity Extremities: no edema, no cyanosis, no clubbing Pulses: 2+ symmetric, upper and lower extremities, normal cap refill Neurological: alert, oriented x 3, CN2-12 intact, strength normal upper extremities and lower extremities, sensation normal throughout, DTRs 2+ throughout, no cerebellar signs, gait normal Psychiatric: normal affect, behavior normal, pleasant  Breast: defer Gyn: defer Rectal: defer   Medicare Attestation I have personally reviewed: The patient's medical and social history Their use of alcohol, tobacco or illicit drugs Their current medications and supplements The patient's functional ability including ADLs,fall risks, home safety risks, cognitive, and hearing and visual impairment Diet and physical activities Evidence for depression or mood disorders  The patient's weight, height, BMI, and visual acuity have been recorded in the chart.  I have made referrals, counseling, and provided education to the patient based on review of the above and I have provided the patient with a written personalized care plan for preventive services.     Starlyn Skeans, PA-C   05/14/2016

## 2016-07-09 ENCOUNTER — Encounter: Payer: Self-pay | Admitting: Internal Medicine

## 2016-07-09 ENCOUNTER — Ambulatory Visit (INDEPENDENT_AMBULATORY_CARE_PROVIDER_SITE_OTHER): Payer: Medicare Other | Admitting: Internal Medicine

## 2016-07-09 VITALS — BP 124/68 | HR 78 | Temp 98.2°F | Resp 16 | Ht 62.5 in | Wt 150.0 lb

## 2016-07-09 DIAGNOSIS — Z79899 Other long term (current) drug therapy: Secondary | ICD-10-CM

## 2016-07-09 DIAGNOSIS — I10 Essential (primary) hypertension: Secondary | ICD-10-CM | POA: Diagnosis not present

## 2016-07-09 DIAGNOSIS — E039 Hypothyroidism, unspecified: Secondary | ICD-10-CM

## 2016-07-09 DIAGNOSIS — E559 Vitamin D deficiency, unspecified: Secondary | ICD-10-CM

## 2016-07-09 DIAGNOSIS — E119 Type 2 diabetes mellitus without complications: Secondary | ICD-10-CM | POA: Diagnosis not present

## 2016-07-09 DIAGNOSIS — E785 Hyperlipidemia, unspecified: Secondary | ICD-10-CM

## 2016-07-09 DIAGNOSIS — F4321 Adjustment disorder with depressed mood: Secondary | ICD-10-CM | POA: Diagnosis not present

## 2016-07-09 LAB — HEPATIC FUNCTION PANEL
ALBUMIN: 4.2 g/dL (ref 3.6–5.1)
ALK PHOS: 85 U/L (ref 33–130)
ALT: 13 U/L (ref 6–29)
AST: 20 U/L (ref 10–35)
Bilirubin, Direct: 0.2 mg/dL (ref <=0.2)
Indirect Bilirubin: 0.5 mg/dL (ref 0.2–1.2)
TOTAL PROTEIN: 7.1 g/dL (ref 6.1–8.1)
Total Bilirubin: 0.7 mg/dL (ref 0.2–1.2)

## 2016-07-09 LAB — CBC WITH DIFFERENTIAL/PLATELET
BASOS PCT: 0 %
Basophils Absolute: 0 cells/uL (ref 0–200)
EOS ABS: 60 {cells}/uL (ref 15–500)
Eosinophils Relative: 1 %
HEMATOCRIT: 40.8 % (ref 35.0–45.0)
HEMOGLOBIN: 13.6 g/dL (ref 11.7–15.5)
LYMPHS PCT: 39 %
Lymphs Abs: 2340 cells/uL (ref 850–3900)
MCH: 26.3 pg — ABNORMAL LOW (ref 27.0–33.0)
MCHC: 33.3 g/dL (ref 32.0–36.0)
MCV: 78.8 fL — AB (ref 80.0–100.0)
MONO ABS: 420 {cells}/uL (ref 200–950)
MPV: 9.5 fL (ref 7.5–12.5)
Monocytes Relative: 7 %
NEUTROS PCT: 53 %
Neutro Abs: 3180 cells/uL (ref 1500–7800)
Platelets: 320 10*3/uL (ref 140–400)
RBC: 5.18 MIL/uL — ABNORMAL HIGH (ref 3.80–5.10)
RDW: 14.8 % (ref 11.0–15.0)
WBC: 6 10*3/uL (ref 3.8–10.8)

## 2016-07-09 LAB — LIPID PANEL
Cholesterol: 113 mg/dL — ABNORMAL LOW (ref 125–200)
HDL: 47 mg/dL (ref >=46)
LDL Cholesterol: 48 mg/dL (ref <130)
Total CHOL/HDL Ratio: 2.4 Ratio (ref <=5.0)
Triglycerides: 88 mg/dL (ref <150)
VLDL: 18 mg/dL (ref <30)

## 2016-07-09 LAB — BASIC METABOLIC PANEL WITH GFR
BUN: 10 mg/dL (ref 7–25)
CO2: 28 mmol/L (ref 20–31)
Calcium: 9.8 mg/dL (ref 8.6–10.4)
Chloride: 100 mmol/L (ref 98–110)
Creat: 0.69 mg/dL (ref 0.60–0.93)
GFR, Est Non African American: 85 mL/min (ref >=60)
GLUCOSE: 105 mg/dL — AB (ref 65–99)
POTASSIUM: 3.6 mmol/L (ref 3.5–5.3)
Sodium: 140 mmol/L (ref 135–146)

## 2016-07-09 LAB — HEMOGLOBIN A1C
HEMOGLOBIN A1C: 6.1 % — AB (ref <5.7)
Mean Plasma Glucose: 128 mg/dL

## 2016-07-09 LAB — TSH: TSH: 0.79 m[IU]/L

## 2016-07-09 NOTE — Progress Notes (Signed)
Assessment and Plan:  Hypertension:  -Continue medication -monitor blood pressure at home. -Continue DASH diet -Reminder to go to the ER if any CP, SOB, nausea, dizziness, severe HA, changes vision/speech, left arm numbness and tingling and jaw pain.  Cholesterol - Continue diet and exercise -Check cholesterol.   Diabetes with diabetic chronic kidney disease -Continue diet and exercise.  -Check A1C  Vitamin D Def -continue medications.   Actively Grieving -currently grieving about the loss of recent friends and her brother in laws terminal condition.  Hypothyroid -cont levothyroxine -TSH  Continue diet and meds as discussed. Further disposition pending results of labs. Discussed med's effects and SE's.    HPI 75 y.o. female  presents for 3 month follow up with hypertension, hyperlipidemia, diabetes and vitamin D deficiency.   Her blood pressure has been controlled at home, today their BP is BP: 124/68.She does workout. She denies chest pain, shortness of breath, dizziness.    She is on cholesterol medication and denies myalgias. Her cholesterol is at goal. The cholesterol was:  04/02/2016: Cholesterol 218; HDL 49; LDL Cholesterol 142; Triglycerides 134.  She is currently taking pravastatin a 1/4 tablet and this is all she can tolerate.  She reports that she takes it every night.    She has been working on diet and exercise for diabetes with diabetic chronic kidney disease, she is on bASA, she is on ACE/ARB, and denies  foot ulcerations, hyperglycemia, hypoglycemia , increased appetite, nausea, paresthesia of the feet, polydipsia, polyuria, visual disturbances, vomiting and weight loss. Last A1C was: 04/02/2016: Hgb A1c MFr Bld 5.8   Patient is on Vitamin D supplement. 04/02/2016: Vit D, 25-Hydroxy 78  She reports that her brother in law has amyloidosis.  She also reports that she has had a lot of friends pass away very recently.    Current Medications:  Current Outpatient  Prescriptions on File Prior to Visit  Medication Sig Dispense Refill  . aspirin 81 MG tablet Take 81 mg by mouth daily.      . Calcium Carbonate-Vitamin D (CALCIUM 600 + D PO) Take by mouth 2 (two) times daily.     Marland Kitchen levothyroxine (SYNTHROID, LEVOTHROID) 88 MCG tablet Take 1 tablet (88 mcg total) by mouth daily before breakfast. 90 tablet 1  . Multiple Vitamins-Minerals (MULTIVITAMIN PO) Take by mouth.    . triamterene-hydrochlorothiazide (MAXZIDE) 75-50 MG per tablet TAKE ONE TABLET BY MOUTH ONCE DAILY FOR BLOOD PRESSURE AND  FLUID 90 tablet 3  . VITAMIN D, ERGOCALCIFEROL, PO Take 4,000 Int'l Units by mouth daily.     . metFORMIN (GLUCOPHAGE XR) 500 MG 24 hr tablet Take 1 tablet with breakfast and lunch and 2 tablets with supper for diabetes. 360 tablet 1   No current facility-administered medications on file prior to visit.    Medical History:  Past Medical History:  Diagnosis Date  . Allergy   . Cancer ([place])    left breast  . Depression   . Diabetes mellitus without complication ([place])   . Hyperlipidemia   . Hypertension   . Hypothyroid   . Insomnia   . Iron deficiency anemia   . Thyroid disease   . Vitamin D deficiency    Allergies:  Allergies  Allergen Reactions  . Calan [Verapamil]     Constipation   . Effexor [Venlafaxine]   . Erythromycin   . Femara [Letrozole]   . Fosamax [Alendronate]     aching  . Ibuprofen   . Paxil [Paroxetine Hcl] Nausea  Only  . Remeron [Mirtazapine]     Weight gain   . Tamoxifen     Hair loss @ low dose  . Vasotec [Enalapril] Cough     Review of Systems:  Review of Systems  Constitutional: Negative for chills, fever and malaise/fatigue.  HENT: Negative for congestion, ear pain and sore throat.   Eyes: Negative.   Respiratory: Negative for cough, shortness of breath and wheezing.   Cardiovascular: Negative for chest pain, palpitations and leg swelling.  Gastrointestinal: Negative for abdominal pain, blood in stool, constipation,  diarrhea, heartburn and melena.  Genitourinary: Negative.   Skin: Negative.   Neurological: Negative for dizziness, sensory change, loss of consciousness and headaches.  Psychiatric/Behavioral: Negative for depression. The patient is not nervous/anxious and does not have insomnia.     Family history- Review and unchanged  Social history- Review and unchanged  Physical Exam: BP 124/68   Pulse 78   Temp 98.2 F (36.8 C) (Temporal)   Resp 16   Ht 5' 2.5" (1.588 m)   Wt 150 lb (68 kg)   BMI 27.00 kg/m  Wt Readings from Last 3 Encounters:  07/09/16 150 lb (68 kg)  05/14/16 151 lb (68.5 kg)  04/02/16 151 lb (68.5 kg)   General Appearance: Well nourished well developed, non-toxic appearing, in no apparent distress. Eyes: PERRLA, EOMs, conjunctiva no swelling or erythema ENT/Mouth: Ear canals clear with no erythema, swelling, or discharge.  TMs normal bilaterally, oropharynx clear, moist, with no exudate.   Neck: Supple, thyroid normal, no JVD, no cervical adenopathy.  Respiratory: Respiratory effort normal, breath sounds clear A&P, no wheeze, rhonchi or rales noted.  No retractions, no accessory muscle usage Cardio: RRR with no MRGs. No noted edema.  Abdomen: Soft, + BS.  Non tender, no guarding, rebound, hernias, masses. Musculoskeletal: Full ROM, 5/5 strength, Normal gait Skin: Warm, dry without rashes, lesions, ecchymosis.  Neuro: Awake and oriented X 3, Cranial nerves intact. No cerebellar symptoms.  Psych: normal affect, Insight and Judgment appropriate.    Starlyn Skeans, PA-C 9:30 AM [facility]

## 2016-08-20 ENCOUNTER — Other Ambulatory Visit: Payer: Self-pay | Admitting: *Deleted

## 2016-08-20 ENCOUNTER — Other Ambulatory Visit: Payer: Self-pay | Admitting: Internal Medicine

## 2016-08-20 DIAGNOSIS — E119 Type 2 diabetes mellitus without complications: Secondary | ICD-10-CM

## 2016-08-20 MED ORDER — METFORMIN HCL ER 500 MG PO TB24: ORAL_TABLET | ORAL | 1 refills | Status: DC

## 2016-08-20 MED ORDER — TRIAMTERENE-HCTZ 75-50 MG PO TABS: ORAL_TABLET | ORAL | 3 refills | Status: DC

## 2016-09-07 ENCOUNTER — Ambulatory Visit (INDEPENDENT_AMBULATORY_CARE_PROVIDER_SITE_OTHER): Payer: Medicare Other | Admitting: Internal Medicine

## 2016-09-07 ENCOUNTER — Encounter: Payer: Self-pay | Admitting: Internal Medicine

## 2016-09-07 ENCOUNTER — Other Ambulatory Visit (HOSPITAL_COMMUNITY)
Admission: RE | Admit: 2016-09-07 | Discharge: 2016-09-07 | Disposition: A | Discharge status: Home / Self Care | Payer: Medicare Other | Source: Ambulatory Visit | Attending: Internal Medicine | Admitting: Internal Medicine

## 2016-09-07 VITALS — BP 148/80 | HR 84 | Temp 98.0°F | Resp 16 | Ht 62.5 in | Wt 150.0 lb

## 2016-09-07 DIAGNOSIS — Z1151 Encounter for screening for human papillomavirus (HPV): Secondary | ICD-10-CM | POA: Insufficient documentation

## 2016-09-07 DIAGNOSIS — N950 Postmenopausal bleeding: Secondary | ICD-10-CM

## 2016-09-07 DIAGNOSIS — Z01411 Encounter for gynecological examination (general) (routine) with abnormal findings: Secondary | ICD-10-CM | POA: Insufficient documentation

## 2016-09-07 NOTE — Progress Notes (Signed)
Subjective:    Patient ID: Tammy Hensley, female    DOB: 03-14-1941, 75 y.o.   MRN: [id number]  HPI  Patient presents to the office for evaluation of right sided flank pain and vaginal bleeding.  She reports that on Friday she noted some spotting on the tissue paper when she wiped.  On Saturday she developed right flank pain and then also had some heavier bleeding.  She reports that she has noted both red and brown discharge.  She reports that the pain radiates to the RLQ.  She reports that she has never had any spotting or bleeding since menopause.  She has a history of a urterine fibroid.  She reports that she is not really sure if it is actually from the vagina or the urethra.  She does not have a history of kidney stones.  She reports that pain on her side is a 1/10.  It did go away with tylenol.    Review of Systems  Constitutional: Negative for chills, diaphoresis, fever and unexpected weight change.  Gastrointestinal: Positive for abdominal pain. Negative for blood in stool, constipation, diarrhea, nausea and vomiting.  Genitourinary: Positive for flank pain, pelvic pain and vaginal bleeding. Negative for difficulty urinating, dysuria, frequency, hematuria, urgency, vaginal discharge and vaginal pain.       Objective:   Physical Exam  Constitutional: She is oriented to person, place, and time. She appears well-developed and well-nourished. No distress.  HENT:  Head: Normocephalic.  Mouth/Throat: Oropharynx is clear and moist. No oropharyngeal exudate.  Eyes: Conjunctivae are normal. No scleral icterus.  Neck: Normal range of motion. Neck supple. No JVD present. No thyromegaly present.  Cardiovascular: Normal rate, regular rhythm, normal heart sounds and intact distal pulses.  Exam reveals no gallop and no friction rub.   No murmur heard. Pulmonary/Chest: Effort normal and breath sounds normal. No respiratory distress. She has no wheezes. She has no rales. She exhibits no  tenderness.  Abdominal: Soft. Normal appearance and bowel sounds are normal. She exhibits no distension and no mass. There is no tenderness. There is no rebound, no guarding and no CVA tenderness.  Genitourinary: No labial fusion. There is no rash, tenderness, lesion or injury on the right labia. There is no rash, tenderness, lesion or injury on the left labia. No erythema or tenderness in the vagina. No foreign body in the vagina. No signs of injury around the vagina. No vaginal discharge found.  Genitourinary Comments: Mild notable irritation of the urethra without obvious deformity.  Blood on the tip of the wet prep swab.  No obvious blood.    Musculoskeletal: Normal range of motion.  Lymphadenopathy:    She has no cervical adenopathy.  Neurological: She is alert and oriented to person, place, and time.  Skin: Skin is warm and dry. She is not diaphoretic.  Psychiatric: She has a normal mood and affect. Her behavior is normal. Judgment and thought content normal.  Nursing note and vitals reviewed.   Vitals:   09/07/16 1432  BP: (!) 148/80  Pulse: 84  Resp: 16  Temp: 98 F (36.7 C)   Wt Readings from Last 3 Encounters:  09/07/16 150 lb (68 kg)  07/09/16 150 lb (68 kg)  05/14/16 151 lb (68.5 kg)        Assessment & Plan:    1. Post-menopausal bleeding -if no obvious source of bleeding we should consider pelvic ultrasound.   - Cytology - PAP - WET PREP BY MOLECULAR PROBE -  Urinalysis, Routine w reflex microscopic (not at [facility]) - Culture, Urine

## 2016-09-08 LAB — WET PREP BY MOLECULAR PROBE
CANDIDA SPECIES: NEGATIVE
GARDNERELLA VAGINALIS: NEGATIVE
Trichomonas vaginosis: NEGATIVE

## 2016-09-08 LAB — URINALYSIS, MICROSCOPIC ONLY
BACTERIA UA: NONE SEEN [HPF]
Casts: NONE SEEN [LPF]
Crystals: NONE SEEN [HPF]
RBC / HPF: NONE SEEN RBC/HPF (ref <=2)
Squamous Epithelial / LPF: NONE SEEN [HPF] (ref <=5)
YEAST: NONE SEEN [HPF]

## 2016-09-08 LAB — URINALYSIS, ROUTINE W REFLEX MICROSCOPIC
BILIRUBIN URINE: NEGATIVE
Glucose, UA: NEGATIVE
KETONES UR: NEGATIVE
NITRITE: NEGATIVE
PROTEIN: NEGATIVE
SPECIFIC GRAVITY, URINE: 1.008 (ref 1.001–1.035)
pH: 7 (ref 5.0–8.0)

## 2016-09-08 LAB — URINE CULTURE

## 2016-09-09 ENCOUNTER — Other Ambulatory Visit: Payer: Self-pay | Admitting: Internal Medicine

## 2016-09-09 DIAGNOSIS — N950 Postmenopausal bleeding: Secondary | ICD-10-CM

## 2016-09-09 DIAGNOSIS — C539 Malignant neoplasm of cervix uteri, unspecified: Secondary | ICD-10-CM

## 2016-09-09 DIAGNOSIS — R109 Unspecified abdominal pain: Secondary | ICD-10-CM

## 2016-09-09 NOTE — Progress Notes (Signed)
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  Cells were sent out for testing of HPV so results are not currently available.  Ultrasound set up for 09/17/16.  We will need to refer to obgyn.

## 2016-09-10 ENCOUNTER — Telehealth: Payer: Self-pay | Admitting: Internal Medicine

## 2016-09-10 ENCOUNTER — Other Ambulatory Visit: Payer: Self-pay | Admitting: Internal Medicine

## 2016-09-10 DIAGNOSIS — N950 Postmenopausal bleeding: Secondary | ICD-10-CM

## 2016-09-10 DIAGNOSIS — C801 Malignant (primary) neoplasm, unspecified: Secondary | ICD-10-CM

## 2016-09-10 NOTE — Telephone Encounter (Signed)
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  I also discussed that she will be seeing Dr. Waylan Rocher for a pap smear for further examination and a colposcopy and what to expect with this.  She is also set up for her CT abdomen and pelvis for further evaluation.  Patient is stated understanding to this and is hopeful that this is something that can be treated appropriately.  She was encouraged to call us if she has any questions or needed help speaking with her family members.  She was also reminded to pick up contrast for her CT abdomen on the 24th.

## 2016-09-11 LAB — CYTOLOGY - PAP: HPV (WINDOPATH): NOT DETECTED

## 2016-09-15 ENCOUNTER — Ambulatory Visit
Admission: RE | Admit: 2016-09-15 | Discharge: 2016-09-15 | Disposition: A | Discharge status: Home / Self Care | Payer: Medicare Other | Source: Ambulatory Visit | Attending: Internal Medicine | Admitting: Internal Medicine

## 2016-09-15 DIAGNOSIS — C801 Malignant (primary) neoplasm, unspecified: Secondary | ICD-10-CM

## 2016-09-15 DIAGNOSIS — N950 Postmenopausal bleeding: Secondary | ICD-10-CM

## 2016-09-15 MED ORDER — IOPAMIDOL (ISOVUE-300) INJECTION 61%
100.0000 mL | Freq: Once | INTRAVENOUS | Status: AC | PRN
  Administered 2016-09-15: 100 mL via INTRAVENOUS

## 2016-09-17 ENCOUNTER — Other Ambulatory Visit: Payer: Medicare Other

## 2016-09-19 ENCOUNTER — Encounter: Payer: Self-pay | Admitting: *Deleted

## 2016-09-28 ENCOUNTER — Other Ambulatory Visit: Payer: Self-pay | Admitting: Oncology

## 2016-09-29 NOTE — Progress Notes (Signed)
GYNECOLOGIC ONCOLOGY NEW PATIENT CONSULTATION  Date of Service: 09/30/16 Referring Provider: Dr. [name] Consulting Provider: Marcello Fennel. Carlis Abbott, MD  HISTORY OF PRESENT ILLNESS: Tammy Hensley is a 75 y.o. woman who is seen in consultation at the request of Dr. [name] for evaluation of endometrial cancer.   The patient presented to her PCP with postmenopausal bleeding. A pap smear was obtained showing carcinoma. She was seen by Dr. [name] and underwent colposcopy with ECC and EMB. 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 ECC and cervical biopsy were benign.  She has a personal history of stage II breast cancer. She took tamoxifen for 5 years. She reports no bleeding or GYN complaint until ~10/20. She has continued occasional spotting. She denies changes in bowel or bladder habits. No weight loss or anorexia. She does report undergoing genetic testing with her breast cancer 11 years ago which was reportedly negative. She has two family members with prostate cancer (dad and brother) and no family history of colon, breast, uterine or ovarian cancer.   Regarding her functional status, she reports she is able to ambulate up a flight of stairs without shortness of breath and denies active cardiopulmonary complaint.  On ROS she denies N/V, headache, abdominal pain.  PAST MEDICAL HISTORY: Past Medical History:  Diagnosis Date  . Allergy   . Cancer ([place])    left breast  . Depression   . Diabetes mellitus without complication ([place])   . Hyperlipidemia   . Hypertension   . Hypothyroid   . Insomnia   . Iron deficiency anemia   . Thyroid disease   . Uterine cancer ([place])   . Vitamin D deficiency     PAST SURGICAL HISTORY: Past Surgical History:  Procedure Laterality Date  . MASTECTOMY Left 2006    OB/GYN HISTORY: OB History  Gravida Para Term Preterm AB Living  4 2 2   2 2   SAB TAB Ectopic Multiple Live Births  2       2    # Outcome Date GA Lbr  Len/2nd Weight Sex Delivery Anes PTL Lv  4 SAB           3 SAB           2 Term           1 Term             Obstetric Comments  SVDx2     Age at menopause: ~50 Hx of HRT: denies Hx of STDs: denies Last pap: 2017  History of abnormal pap smears: denies  MEDICATIONS:  Current Outpatient Prescriptions:  .  aspirin 81 MG tablet, Take 81 mg by mouth daily.  , Disp: , Rfl:  .  Calcium Carbonate-Vitamin D (CALCIUM 600 + D PO), Take by mouth 2 (two) times daily. , Disp: , Rfl:  .  levothyroxine (SYNTHROID, LEVOTHROID) 88 MCG tablet, Take 1 tablet (88 mcg total) by mouth daily before breakfast. (Patient taking differently: Take 88 mcg by mouth daily before breakfast. 1/2 pill M, W ,F and Sat), Disp: 90 tablet, Rfl: 1 .  metFORMIN (GLUCOPHAGE-XR) 500 MG 24 hr tablet, TAKE ONE TABLET BY MOUTH WITH BREAKFAST AND LUNCH AND TAKE TWO TABLETS BY MOUTH WITH SUPPER FOR DAIBETES., Disp: 360 tablet, Rfl: 1 .  Multiple Vitamins-Minerals (MULTIVITAMIN PO), Take by mouth., Disp: , Rfl:  .  pravastatin (PRAVACHOL) 40 MG tablet, Take 10 mg by mouth daily. , Disp: , Rfl:  .  triamterene-hydrochlorothiazide (MAXZIDE)  75-50 MG tablet, TAKE ONE TABLET BY MOUTH ONCE DAILY FOR BLOOD PRESSURE AND  FLUID (Patient taking differently: TAKE ONE TABLET BY MOUTH ONCE DAILY FOR BLOOD PRESSURE AND  FLUID), Disp: 90 tablet, Rfl: 3 .  VITAMIN D, ERGOCALCIFEROL, PO, Take 4,000 Int'l Units by mouth daily. , Disp: , Rfl:   ALLERGIES: Allergies  Allergen Reactions  . Calan [Verapamil]     Constipation   . Effexor [Venlafaxine]   . Erythromycin   . Femara [Letrozole]   . Fosamax [Alendronate]     aching  . Ibuprofen   . Paxil [Paroxetine Hcl] Nausea Only  . Remeron [Mirtazapine]     Weight gain   . Tamoxifen     Hair loss @ low dose  . Vasotec [Enalapril] Cough    FAMILY HISTORY: Family History  Problem Relation Age of Onset  . Heart disease Mother   . Heart attack Mother   . Cancer Father   . Heart attack  Father     SOCIAL HISTORY: Social History   Social History  . Marital status: Married    Spouse name: N/A  . Number of children: N/A  . Years of education: N/A   Occupational History  . Not on file.   Social History Main Topics  . Smoking status: Never Smoker  . Smokeless tobacco: Never Used  . Alcohol use No  . Drug use: Unknown  . Sexual activity: Not on file   Other Topics Concern  . Not on file   Social History Narrative  . No narrative on file    REVIEW OF SYSTEMS: Complete 10-system review is negative except as per HPI.  PHYSICAL EXAM: BP (!) 150/86 (BP Location: Right Arm, Patient Position: Sitting) Comment: Left the nurse note making her aware of elevated BP  Pulse 94   Temp 98 F (36.7 C) (Oral)   Resp 17   Ht 5' 2.5" (1.588 m)   Wt 150 lb 3.2 oz (68.1 kg)   SpO2 95%   BMI 27.03 kg/m  General: Alert, oriented, no acute distress. HEENT: Normocephalic, atraumatic.  Sclera anicteric, posterior oropharynx clear.  Normal dentition. Chest/pulm: Clear to auscultation bilaterally. Cardiovascular: Regular rate and rhythm, no murmurs, rubs, or gallops. Abdomen/GI: Normoactive bowel sounds.  Soft, nondistended, nontender to palpation.  No masses or hepatosplenomegaly appreciated.  No evidence of hernia.  No palpable fluid wave.  No incisions. Extremities: Grossly normal range of motion.  Warm, well perfused.  No edema bilaterally. Skin: No rashes or lesions. Lymphatics: No cervical, supraclavicular, or inguinal adenopathy. GU: External genitalia without lesions.  On speculum exam, normal appearing cervix without lesion.  Bimanual exam reveals mildly enlarged uterus, midposition about 12-14cm with fullness on right and some voluntary guarding limiting the exam. No nodularity. Rectovaginal exam confirms the above findings and reveals no nodularity.  LABORATORY AND RADIOLOGIC DATA: Outside medical records were reviewed to synthesize the above history, along with the  history and physical obtained during the visit.  Outside laboratory, pathology, and imaging reports were reviewed, with pertinent results below.  I personally reviewed the images.  CT 10/24: FINDINGS: Lower chest: Mild dependent atelectasis is seen in the lung bases. No pleural or pericardial effusion. Postoperative change is noted in the left breast where 3-4 small surgical clips are identified.  Hepatobiliary: Unremarkable.  Pancreas: Unremarkable.  Spleen: Unremarkable.  Adrenals/Urinary Tract: Cyst in the upper pole of the left kidney measures 2.0 cm in diameter. Cyst in lower pole of the right kidney measures 0.8  cm in diameter. Otherwise unremarkable in appearance.  Stomach/Bowel: Unremarkable.  Vascular/Lymphatic: Aortoiliac atherosclerosis without aneurysm is identified.  Reproductive: The endometrial stripe is markedly thickened at 3.5 cm with marked heterogeneity identified. Large calcified fibroid is noted. Cystic lesion in the right ovary measures 3.5 by 2.9 by 3.9 cm. Left adnexa is unremarkable.  Other: No lymphadenopathy or fluid.  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 Mild degenerative disease is noted about the hips.  IMPRESSION: Markedly thickened and heterogeneous endometrial stripe suspicious for endometrial carcinoma in this patient with postmenopausal vaginal bleeding.  Cystic lesion in the right ovary cannot be definitively characterized. Pelvic ultrasound is recommended for further evaluation. This recommendation follows ACR consensus guidelines: White Paper of the ACR Incidental Findings Committee II on Adnexal Findings. J Am Coll Radiol [phone].  Aortoiliac atherosclerosis.  Avascular necrosis of the femoral heads bilaterally.  ASSESSMENT AND PLAN: NICIA LAVIGNA is a 75 y.o. 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  We reviewed the  nature of endometrial cancer and its recommended surgical treatment, including total hysterectomy, BSO, and lymph node assessment.  We discussed that the grade and histology seen on her endometrial biopsy often requires additional adjuvant therapy with chemotherapy but that treatment planning is dictated by the final pathology results. The patient is a suitable candidate for staging via a minimally invasive approach to surgery, but we reviewed a potential need for laparotomy for specimen retrieval given fibroids on exam.  We reviewed that robotic assistance would be used to complete the surgery.    We discussed the need for preoperative chest xray given CT only included the abdomen and pelvis. We will also obtain a Ca125 with preoperative labs.  We reviewed the sentinel lymph node technique. Risks and benefits of sentinel lymph node biopsy was reviewed. We reviewed the technique and ICG dye. The patient DOES NOT have an iodine allergy or known liver dysfunction. We reviewed the false negative rate (0.4%), and that 3% of patients with metastatic disease will not have it detected by SLN biopsy in endometrial cancer. A low risk of allergic reaction to the dye, <0.2% for ICG, has been reported. We also discussed that in the case of failed mapping, which can occur up to 40% of the time, a bilateral or unilateral lymphadenectomy will be performed at the surgeon's discretion.   Potential benefits of sentinel nodes including a higher detection rate for metastasis due to ultrastaging and potential reduction in operative morbidity. However, there remains uncertainty as to the role for treatment of micrometastatic disease. Further, the benefit of operative morbidity associated with the SLN technique in endometrial cancer is not yet completely known. In other patient populations (e.g. the cervical cancer population) there has been observed reductions in morbidity with SLN biopsy compared to pelvic lymphadenectomy.  Lymphedema, nerve dysfunction and lymphocysts are all potential risks with the SLN technique as with complete lymphadenectomy. Additional risks to the patient include the risk of damage to an internal organ while operating in an altered view (e.g. the black and white image of the robotic fluorescence imaging mode).   We reviewed the planned surgery in detail. She is scheduled for robotic TLH/BSO/sentinel Ln, possible complete lymphadenectomy, possible laparotomy on 11/21 with Dr. Nancy Marus.  The risks of surgery were discussed in detail and she understands these to including but not limited to bleeding, blood transfusion, infection, wound separation, injury to adjacent organs, anesthesia risk, thromboembolic events, lymphedema, conversion to  laparotomy, unforseen complications and possible need for re-exploration. She voiced a clear understanding. She had the opportunity to ask questions and she wishes to proceed.  The patient reports METs >4 and will not require further clearance.  Pre-operative and post-operative instructions and expectations were reviewed with the patient in detail. All her questions were answered to her satisfaction.  A copy of this note was sent to the patient's referring provider. I appreciate the opportunity to be involved in the care of this patient.  Marcello Fennel. Carlis Abbott, MD

## 2016-09-30 ENCOUNTER — Ambulatory Visit: Payer: Medicare Other | Attending: Gynecologic Oncology | Admitting: Gynecologic Oncology

## 2016-09-30 ENCOUNTER — Encounter: Payer: Self-pay | Admitting: Gynecologic Oncology

## 2016-09-30 VITALS — BP 150/86 | HR 94 | Temp 98.0°F | Resp 17 | Ht 62.5 in | Wt 150.2 lb

## 2016-09-30 DIAGNOSIS — N950 Postmenopausal bleeding: Secondary | ICD-10-CM | POA: Diagnosis not present

## 2016-09-30 DIAGNOSIS — I700 Atherosclerosis of aorta: Secondary | ICD-10-CM | POA: Diagnosis not present

## 2016-09-30 DIAGNOSIS — M87859 Other osteonecrosis, unspecified femur: Secondary | ICD-10-CM | POA: Insufficient documentation

## 2016-09-30 DIAGNOSIS — Z7982 Long term (current) use of aspirin: Secondary | ICD-10-CM | POA: Insufficient documentation

## 2016-09-30 DIAGNOSIS — C541 Malignant neoplasm of endometrium: Secondary | ICD-10-CM

## 2016-09-30 DIAGNOSIS — Z7984 Long term (current) use of oral hypoglycemic drugs: Secondary | ICD-10-CM | POA: Insufficient documentation

## 2016-09-30 DIAGNOSIS — Z853 Personal history of malignant neoplasm of breast: Secondary | ICD-10-CM | POA: Diagnosis not present

## 2016-09-30 NOTE — Patient Instructions (Addendum)
Preparing for your Surgery  Plan for surgery on October 13, 2016 with Dr. Nancy Marus for a Robotic total hysterectomy ,bilateral salpingo-oophorectomy ,sentinel lymph node biopsy .  Pre-operative Testing -You will receive a phone call from presurgical testing at [facility] to arrange for a pre-operative testing appointment before your surgery.  This appointment normally occurs one to two weeks before your scheduled surgery.   -Bring your insurance card, copy of an advanced directive if applicable, medication list  -At that visit, you will be asked to sign a consent for a possible blood transfusion in case a transfusion becomes necessary during surgery.  The need for a blood transfusion is rare but having consent is a necessary part of your care.     -You should not be taking blood thinners or aspirin at least ten days prior to surgery unless instructed by your surgeon.  Day Before Surgery at [place] will be asked to take in a light diet the day before surgery.  Avoid carbonated beverages.  You will be advised to have nothing to eat or drink after midnight the evening before.     Eat a light diet the day before surgery.  Examples including soups, broths,  toast, yogurt, mashed potatoes.  Things to avoid include carbonated beverages  (fizzy beverages), raw fruits and raw vegetables, or beans.    If your bowels are filled with gas, your surgeon will have difficulty  visualizing your pelvic organs which increases your surgical risks.  Your role in recovery Your role is to become active as soon as directed by your doctor, while still giving yourself time to heal.  Rest when you feel tired. You will be asked to do the following in order to speed your recovery:  - Cough and breathe deeply. This helps toclear and expand your lungs and can prevent pneumonia. You may be given a spirometer to practice deep breathing. A staff member will show you how to use the spirometer. -  Do mild physical activity. Walking or moving your legs help your circulation and body functions return to normal. A staff member will help you when you try to walk and will provide you with simple exercises. Do not try to get up or walk alone the first time. - Actively manage your pain. Managing your pain lets you move in comfort. We will ask you to rate your pain on a scale of zero to 10. It is your responsibility to tell your doctor or nurse where and how much you hurt so your pain can be treated.  Special Considerations -If you are diabetic, you may be placed on insulin after surgery to have closer control over your blood sugars to promote healing and recovery.  This does not mean that you will be discharged on insulin.  If applicable, your oral antidiabetics will be resumed when you are tolerating a solid diet.  -Your final pathology results from surgery should be available by the Friday after surgery and the results will be relayed to you when available.   Blood Transfusion Information WHAT IS A BLOOD TRANSFUSION? A transfusion is the replacement of blood or some of its parts. Blood is made up of multiple cells which provide different functions.  Red blood cells carry oxygen and are used for blood loss replacement.  White blood cells fight against infection.  Platelets control bleeding.  Plasma helps clot blood.  Other blood products are available for specialized needs, such as hemophilia or other clotting disorders. BEFORE THE  TRANSFUSION  Who gives blood for transfusions?   You may be able to donate blood to be used at a later date on yourself (autologous donation).  Relatives can be asked to donate blood. This is generally not any safer than if you have received blood from a stranger. The same precautions are taken to ensure safety when a relative's blood is donated.  Healthy volunteers who are fully evaluated to make sure their blood is safe. This is blood bank  blood. Transfusion therapy is the safest it has ever been in the practice of medicine. Before blood is taken from a donor, a complete history is taken to make sure that person has no history of diseases nor engages in risky social behavior (examples are intravenous drug use or sexual activity with multiple partners). The donor's travel history is screened to minimize risk of transmitting infections, such as malaria. The donated blood is tested for signs of infectious diseases, such as HIV and hepatitis. The blood is then tested to be sure it is compatible with you in order to minimize the chance of a transfusion reaction. If you or a relative donates blood, this is often done in anticipation of surgery and is not appropriate for emergency situations. It takes many days to process the donated blood. RISKS AND COMPLICATIONS Although transfusion therapy is very safe and saves many lives, the main dangers of transfusion include:   Getting an infectious disease.  Developing a transfusion reaction. This is an allergic reaction to something in the blood you were given. Every precaution is taken to prevent this. The decision to have a blood transfusion has been considered carefully by your caregiver before blood is given. Blood is not given unless the benefits outweigh the risks.

## 2016-10-02 ENCOUNTER — Other Ambulatory Visit: Payer: Self-pay | Admitting: Internal Medicine

## 2016-10-02 DIAGNOSIS — Z1231 Encounter for screening mammogram for malignant neoplasm of breast: Secondary | ICD-10-CM

## 2016-10-07 NOTE — Patient Instructions (Addendum)
Tammy Hensley  10/07/2016   Your procedure is scheduled on: Tuesday 10/13/2016  Report to [facility] Main  Entrance take [place]  elevators to 3rd floor to  [place] at   0830  AM.  Call this number if you have problems the morning of surgery [phone]   Remember: ONLY 1 PERSON MAY GO WITH YOU TO SHORT STAY TO GET  READY MORNING OF [place].  Eat a light diet the day before surgery.  Examples including soups, broths, toast, yogurt, mashed potatoes.  Things to avoid include carbonated beverages (fizzy beverages), raw fruits and raw vegetables, or beans.   If your bowels are filled with gas, your surgeon will have difficulty visualizing your pelvic organs which increases your surgical risks.    Do not eat food or drink liquids :After Midnight.   Take these medications morning of surgery with a sip of water:   NONE  How to Manage Your Diabetes Before and After Surgery  Why is it important to control my blood sugar before and after surgery? . Improving blood sugar levels before and after surgery helps healing and can limit problems. . A way of improving blood sugar control is eating a healthy diet by: o  Eating less sugar and carbohydrates o  Increasing activity/exercise o  Talking with your doctor about reaching your blood sugar goals . High blood sugars (greater than 180 mg/dL) can raise your risk of infections and slow your recovery, so you will need to focus on controlling your diabetes during the weeks before surgery. . Make sure that the doctor who takes care of your diabetes knows about your planned surgery including the date and location.  How do I manage my blood sugar before surgery? . Check your blood sugar at least 4 times a day, starting 2 days before surgery, to make sure that the level is not too high or low. o Check your blood sugar the morning of your surgery when you wake up and every 2 hours until you get to the Short Stay  unit. . If your blood sugar is less than 70 mg/dL, you will need to treat for low blood sugar: o Do not take insulin. o Treat a low blood sugar (less than 70 mg/dL) with  cup of clear juice (cranberry or apple), 4 glucose tablets, OR glucose gel. o Recheck blood sugar in 15 minutes after treatment (to make sure it is greater than 70 mg/dL). If your blood sugar is not greater than 70 mg/dL on recheck, call [phone] for further instructions. . Report your blood sugar to the short stay nurse when you get to Short Stay.  . If you are admitted to the hospital after surgery: o Your blood sugar will be checked by the staff and you will probably be given insulin after surgery (instead of oral diabetes medicines) to make sure you have good blood sugar levels. o The goal for blood sugar control after surgery is 80-180 mg/dL.   WHAT DO I DO ABOUT MY DIABETES MEDICATION?  Marland Kitchen Do not take oral diabetes medicines (pills) the morning of surgery.        DO NOT TAKE ANY DIABETIC MEDICATIONS DAY OF YOUR SURGERY                               You may not have  any metal on your body including hair pins and              piercings  Do not wear jewelry, make-up, lotions, powders or perfumes, deodorant             Do not wear nail polish.  Do not shave  48 hours prior to surgery.              Men may shave face and neck.   Do not bring valuables to the hospital. [place].  Contacts, dentures or bridgework may not be worn into surgery.  Leave suitcase in the car. After surgery it may be brought to your room.        Please read over the following fact sheets you were given: _____________________________________________________________________             [facility] - Preparing for Surgery Before surgery, you can play an important role.  Because skin is not sterile, your skin needs to be as free of germs as possible.  You can reduce the number of  germs on your skin by washing with CHG (chlorahexidine gluconate) soap before surgery.  CHG is an antiseptic cleaner which kills germs and bonds with the skin to continue killing germs even after washing. Please DO NOT use if you have an allergy to CHG or antibacterial soaps.  If your skin becomes reddened/irritated stop using the CHG and inform your nurse when you arrive at Short Stay. Do not shave (including legs and underarms) for at least 48 hours prior to the first CHG shower.  You may shave your face/neck. Please follow these instructions carefully:  1.  Shower with CHG Soap the night before surgery and the  morning of Surgery.  2.  If you choose to wash your hair, wash your hair first as usual with your  normal  shampoo.  3.  After you shampoo, rinse your hair and body thoroughly to remove the  shampoo.                           4.  Use CHG as you would any other liquid soap.  You can apply chg directly  to the skin and wash                       Gently with a scrungie or clean washcloth.  5.  Apply the CHG Soap to your body ONLY FROM THE NECK DOWN.   Do not use on face/ open                           Wound or open sores. Avoid contact with eyes, ears mouth and genitals (private parts).                       Wash face,  Genitals (private parts) with your normal soap.             6.  Wash thoroughly, paying special attention to the area where your surgery  will be performed.  7.  Thoroughly rinse your body with warm water from the neck down.  8.  DO NOT shower/wash with your normal soap after using and rinsing off  the CHG Soap.  9.  Pat yourself dry with a clean towel.            10.  Wear clean pajamas.            11.  Place clean sheets on your bed the night of your first shower and do not  sleep with pets. Day of Surgery : Do not apply any lotions/deodorants the morning of surgery.  Please wear clean clothes to the hospital/surgery center.  FAILURE TO FOLLOW THESE  INSTRUCTIONS MAY RESULT IN THE CANCELLATION OF YOUR SURGERY PATIENT SIGNATURE_________________________________  NURSE SIGNATURE__________________________________  ________________________________________________________________________   Adam Phenix  An incentive spirometer is a tool that can help keep your lungs clear and active. This tool measures how well you are filling your lungs with each breath. Taking long deep breaths may help reverse or decrease the chance of developing breathing (pulmonary) problems (especially infection) following:  A long period of time when you are unable to move or be active. BEFORE THE PROCEDURE   If the spirometer includes an indicator to show your best effort, your nurse or respiratory therapist will set it to a desired goal.  If possible, sit up straight or lean slightly forward. Try not to slouch.  Hold the incentive spirometer in an upright position. INSTRUCTIONS FOR USE  1. Sit on the edge of your bed if possible, or sit up as far as you can in bed or on a chair. 2. Hold the incentive spirometer in an upright position. 3. Breathe out normally. 4. Place the mouthpiece in your mouth and seal your lips tightly around it. 5. Breathe in slowly and as deeply as possible, raising the piston or the ball toward the top of the column. 6. Hold your breath for 3-5 seconds or for as long as possible. Allow the piston or ball to fall to the bottom of the column. 7. Remove the mouthpiece from your mouth and breathe out normally. 8. Rest for a few seconds and repeat Steps 1 through 7 at least 10 times every 1-2 hours when you are awake. Take your time and take a few normal breaths between deep breaths. 9. The spirometer may include an indicator to show your best effort. Use the indicator as a goal to work toward during each repetition. 10. After each set of 10 deep breaths, practice coughing to be sure your lungs are clear. If you have an incision (the  cut made at the time of surgery), support your incision when coughing by placing a pillow or rolled up towels firmly against it. Once you are able to get out of bed, walk around indoors and cough well. You may stop using the incentive spirometer when instructed by your caregiver.  RISKS AND COMPLICATIONS  Take your time so you do not get dizzy or light-headed.  If you are in pain, you may need to take or ask for pain medication before doing incentive spirometry. It is harder to take a deep breath if you are having pain. AFTER USE  Rest and breathe slowly and easily.  It can be helpful to keep track of a log of your progress. Your caregiver can provide you with a simple table to help with this. If you are using the spirometer at home, follow these instructions: [place] IF:   You are having difficultly using the spirometer.  You have trouble using the spirometer as often as instructed.  Your pain medication is not giving enough relief while using the spirometer.  You  develop fever of 100.5 F (38.1 C) or higher. SEEK IMMEDIATE MEDICAL CARE IF:   You cough up bloody sputum that had not been present before.  You develop fever of 102 F (38.9 C) or greater.  You develop worsening pain at or near the incision site. MAKE SURE YOU:   Understand these instructions.  Will watch your condition.  Will get help right away if you are not doing well or get worse. Document Released: 03/22/2007 Document Revised: 02/01/2012 Document Reviewed: 05/23/2007 Exit[employer] Patient Information 2014 [employer], [place].   ________________________________________________________________________  WHAT IS A BLOOD TRANSFUSION? Blood Transfusion Information  A transfusion is the replacement of blood or some of its parts. Blood is made up of multiple cells which provide different functions.  Red blood cells carry oxygen and are used for blood loss replacement.  White blood cells fight against  infection.  Platelets control bleeding.  Plasma helps clot blood.  Other blood products are available for specialized needs, such as hemophilia or other clotting disorders. BEFORE THE TRANSFUSION  Who gives blood for transfusions?   Healthy volunteers who are fully evaluated to make sure their blood is safe. This is blood bank blood. Transfusion therapy is the safest it has ever been in the practice of medicine. Before blood is taken from a donor, a complete history is taken to make sure that person has no history of diseases nor engages in risky social behavior (examples are intravenous drug use or sexual activity with multiple partners). The donor's travel history is screened to minimize risk of transmitting infections, such as malaria. The donated blood is tested for signs of infectious diseases, such as HIV and hepatitis. The blood is then tested to be sure it is compatible with you in order to minimize the chance of a transfusion reaction. If you or a relative donates blood, this is often done in anticipation of surgery and is not appropriate for emergency situations. It takes many days to process the donated blood. RISKS AND COMPLICATIONS Although transfusion therapy is very safe and saves many lives, the main dangers of transfusion include:   Getting an infectious disease.  Developing a transfusion reaction. This is an allergic reaction to something in the blood you were given. Every precaution is taken to prevent this. The decision to have a blood transfusion has been considered carefully by your caregiver before blood is given. Blood is not given unless the benefits outweigh the risks. AFTER THE TRANSFUSION  Right after receiving a blood transfusion, you will usually feel much better and more energetic. This is especially true if your red blood cells have gotten low (anemic). The transfusion raises the level of the red blood cells which carry oxygen, and this usually causes an energy  increase.  The nurse administering the transfusion will monitor you carefully for complications. HOME CARE INSTRUCTIONS  No special instructions are needed after a transfusion. You may find your energy is better. Speak with your caregiver about any limitations on activity for underlying diseases you may have. SEEK MEDICAL CARE IF:   Your condition is not improving after your transfusion.  You develop redness or irritation at the intravenous (IV) site. SEEK IMMEDIATE MEDICAL CARE IF:  Any of the following symptoms occur over the next 12 hours:  Shaking chills.  You have a temperature by mouth above 102 F (38.9 C), not controlled by medicine.  Chest, back, or muscle pain.  People around you feel you are not acting correctly or are confused.  Shortness of  breath or difficulty breathing.  Dizziness and fainting.  You get a rash or develop hives.  You have a decrease in urine output.  Your urine turns a dark color or changes to pink, red, or brown. Any of the following symptoms occur over the next 10 days:  You have a temperature by mouth above 102 F (38.9 C), not controlled by medicine.  Shortness of breath.  Weakness after normal activity.  The white part of the eye turns yellow (jaundice).  You have a decrease in the amount of urine or are urinating less often.  Your urine turns a dark color or changes to pink, red, or brown. Document Released: 11/06/2000 Document Revised: 02/01/2012 Document Reviewed: 06/25/2008 [facility] Patient Information 2014 [place], [place].  _______________________________________________________________________

## 2016-10-08 ENCOUNTER — Encounter (HOSPITAL_COMMUNITY): Payer: Self-pay

## 2016-10-08 ENCOUNTER — Encounter (HOSPITAL_COMMUNITY)
Admission: RE | Admit: 2016-10-08 | Discharge: 2016-10-08 | Disposition: A | Discharge status: Home / Self Care | Payer: Medicare Other | Source: Ambulatory Visit | Attending: Obstetrics & Gynecology | Admitting: Obstetrics & Gynecology

## 2016-10-08 ENCOUNTER — Ambulatory Visit (HOSPITAL_COMMUNITY)
Admission: RE | Admit: 2016-10-08 | Discharge: 2016-10-08 | Disposition: A | Discharge status: Home / Self Care | Payer: Medicare Other | Source: Ambulatory Visit | Attending: Gynecologic Oncology | Admitting: Gynecologic Oncology

## 2016-10-08 DIAGNOSIS — Z01812 Encounter for preprocedural laboratory examination: Secondary | ICD-10-CM | POA: Diagnosis not present

## 2016-10-08 DIAGNOSIS — I700 Atherosclerosis of aorta: Secondary | ICD-10-CM | POA: Diagnosis not present

## 2016-10-08 DIAGNOSIS — C541 Malignant neoplasm of endometrium: Secondary | ICD-10-CM | POA: Diagnosis not present

## 2016-10-08 DIAGNOSIS — Z01818 Encounter for other preprocedural examination: Secondary | ICD-10-CM | POA: Diagnosis present

## 2016-10-08 LAB — COMPREHENSIVE METABOLIC PANEL
ALK PHOS: 85 U/L (ref 38–126)
ALT: 14 U/L (ref 14–54)
ANION GAP: 7 (ref 5–15)
AST: 23 U/L (ref 15–41)
Albumin: 4.5 g/dL (ref 3.5–5.0)
BILIRUBIN TOTAL: 1 mg/dL (ref 0.3–1.2)
BUN: 12 mg/dL (ref 6–20)
CALCIUM: 10 mg/dL (ref 8.9–10.3)
CO2: 31 mmol/L (ref 22–32)
Chloride: 100 mmol/L — ABNORMAL LOW (ref 101–111)
Creatinine, Ser: 0.69 mg/dL (ref 0.44–1.00)
Glucose, Bld: 108 mg/dL — ABNORMAL HIGH (ref 65–99)
Potassium: 4.1 mmol/L (ref 3.5–5.1)
Sodium: 138 mmol/L (ref 135–145)
TOTAL PROTEIN: 7.7 g/dL (ref 6.5–8.1)

## 2016-10-08 LAB — CBC WITH DIFFERENTIAL/PLATELET
Basophils Absolute: 0 10*3/uL (ref 0.0–0.1)
Basophils Relative: 1 %
EOS ABS: 0.1 10*3/uL (ref 0.0–0.7)
Eosinophils Relative: 2 %
HEMATOCRIT: 42 % (ref 36.0–46.0)
HEMOGLOBIN: 13.9 g/dL (ref 12.0–15.0)
LYMPHS ABS: 1.8 10*3/uL (ref 0.7–4.0)
Lymphocytes Relative: 35 %
MCH: 26.2 pg (ref 26.0–34.0)
MCHC: 33.1 g/dL (ref 30.0–36.0)
MCV: 79.2 fL (ref 78.0–100.0)
MONOS PCT: 8 %
Monocytes Absolute: 0.4 10*3/uL (ref 0.1–1.0)
NEUTROS ABS: 2.7 10*3/uL (ref 1.7–7.7)
NEUTROS PCT: 54 %
Platelets: 325 10*3/uL (ref 150–400)
RBC: 5.3 MIL/uL — ABNORMAL HIGH (ref 3.87–5.11)
RDW: 14.3 % (ref 11.5–15.5)
WBC: 5 10*3/uL (ref 4.0–10.5)

## 2016-10-08 LAB — URINALYSIS, ROUTINE W REFLEX MICROSCOPIC
BILIRUBIN URINE: NEGATIVE
GLUCOSE, UA: NEGATIVE mg/dL
KETONES UR: NEGATIVE mg/dL
Nitrite: NEGATIVE
PH: 6.5 (ref 5.0–8.0)
Protein, ur: NEGATIVE mg/dL
Specific Gravity, Urine: 1.018 (ref 1.005–1.030)

## 2016-10-08 LAB — GLUCOSE, CAPILLARY: Glucose-Capillary: 101 mg/dL — ABNORMAL HIGH (ref 65–99)

## 2016-10-08 LAB — URINE MICROSCOPIC-ADD ON

## 2016-10-08 LAB — ABO/RH: ABO/RH(D): A POS

## 2016-10-09 LAB — CA 125: CA 125: 15.1 U/mL (ref 0.0–38.1)

## 2016-10-09 LAB — HEMOGLOBIN A1C
Hgb A1c MFr Bld: 6.2 % — ABNORMAL HIGH (ref 4.8–5.6)
MEAN PLASMA GLUCOSE: 131 mg/dL

## 2016-10-12 ENCOUNTER — Ambulatory Visit: Payer: Medicare Other | Admitting: Oncology

## 2016-10-13 ENCOUNTER — Ambulatory Visit (HOSPITAL_COMMUNITY): Payer: Medicare Other | Admitting: Anesthesiology

## 2016-10-13 ENCOUNTER — Encounter (HOSPITAL_COMMUNITY)
Admission: RE | Disposition: A | Discharge status: Home / Self Care | Payer: Self-pay | Source: Ambulatory Visit | Attending: Obstetrics & Gynecology

## 2016-10-13 ENCOUNTER — Ambulatory Visit (HOSPITAL_COMMUNITY)
Admission: RE | Admit: 2016-10-13 | Discharge: 2016-10-14 | Disposition: A | Discharge status: Home / Self Care | Payer: Medicare Other | Source: Ambulatory Visit | Attending: Obstetrics & Gynecology | Admitting: Obstetrics & Gynecology

## 2016-10-13 ENCOUNTER — Ambulatory Visit: Payer: Self-pay | Admitting: Internal Medicine

## 2016-10-13 ENCOUNTER — Encounter (HOSPITAL_COMMUNITY): Payer: Self-pay | Admitting: *Deleted

## 2016-10-13 DIAGNOSIS — Z9012 Acquired absence of left breast and nipple: Secondary | ICD-10-CM | POA: Insufficient documentation

## 2016-10-13 DIAGNOSIS — D251 Intramural leiomyoma of uterus: Secondary | ICD-10-CM | POA: Insufficient documentation

## 2016-10-13 DIAGNOSIS — E785 Hyperlipidemia, unspecified: Secondary | ICD-10-CM | POA: Diagnosis not present

## 2016-10-13 DIAGNOSIS — E039 Hypothyroidism, unspecified: Secondary | ICD-10-CM | POA: Insufficient documentation

## 2016-10-13 DIAGNOSIS — Z886 Allergy status to analgesic agent status: Secondary | ICD-10-CM | POA: Diagnosis not present

## 2016-10-13 DIAGNOSIS — Z853 Personal history of malignant neoplasm of breast: Secondary | ICD-10-CM | POA: Insufficient documentation

## 2016-10-13 DIAGNOSIS — F329 Major depressive disorder, single episode, unspecified: Secondary | ICD-10-CM | POA: Diagnosis not present

## 2016-10-13 DIAGNOSIS — E119 Type 2 diabetes mellitus without complications: Secondary | ICD-10-CM | POA: Insufficient documentation

## 2016-10-13 DIAGNOSIS — Z881 Allergy status to other antibiotic agents status: Secondary | ICD-10-CM | POA: Insufficient documentation

## 2016-10-13 DIAGNOSIS — C541 Malignant neoplasm of endometrium: Secondary | ICD-10-CM | POA: Diagnosis present

## 2016-10-13 DIAGNOSIS — Z888 Allergy status to other drugs, medicaments and biological substances status: Secondary | ICD-10-CM | POA: Insufficient documentation

## 2016-10-13 DIAGNOSIS — E559 Vitamin D deficiency, unspecified: Secondary | ICD-10-CM | POA: Diagnosis not present

## 2016-10-13 DIAGNOSIS — I10 Essential (primary) hypertension: Secondary | ICD-10-CM | POA: Insufficient documentation

## 2016-10-13 DIAGNOSIS — Z7984 Long term (current) use of oral hypoglycemic drugs: Secondary | ICD-10-CM | POA: Insufficient documentation

## 2016-10-13 DIAGNOSIS — Z7982 Long term (current) use of aspirin: Secondary | ICD-10-CM | POA: Insufficient documentation

## 2016-10-13 DIAGNOSIS — N840 Polyp of corpus uteri: Secondary | ICD-10-CM | POA: Insufficient documentation

## 2016-10-13 DIAGNOSIS — C55 Malignant neoplasm of uterus, part unspecified: Secondary | ICD-10-CM

## 2016-10-13 DIAGNOSIS — G4700 Insomnia, unspecified: Secondary | ICD-10-CM | POA: Diagnosis not present

## 2016-10-13 HISTORY — PX: ROBOTIC ASSISTED LAP VAGINAL HYSTERECTOMY: SHX2362

## 2016-10-13 HISTORY — PX: ROBOTIC ASSISTED SALPINGO OOPHERECTOMY: SHX6082

## 2016-10-13 HISTORY — PX: SENTINEL NODE BIOPSY: SHX6608

## 2016-10-13 LAB — GLUCOSE, CAPILLARY
GLUCOSE-CAPILLARY: 138 mg/dL — AB (ref 65–99)
GLUCOSE-CAPILLARY: 214 mg/dL — AB (ref 65–99)
Glucose-Capillary: 98 mg/dL (ref 65–99)
Glucose-Capillary: 132 mg/dL — ABNORMAL HIGH (ref 65–99)

## 2016-10-13 LAB — TYPE AND SCREEN
ABO/RH(D): A POS
ANTIBODY SCREEN: NEGATIVE

## 2016-10-13 SURGERY — HYSTERECTOMY, VAGINAL, ROBOT-ASSISTED
Anesthesia: General

## 2016-10-13 MED ORDER — LIDOCAINE 2% (20 MG/ML) 5 ML SYRINGE
INTRAMUSCULAR | Status: AC
  Filled 2016-10-13: qty 5

## 2016-10-13 MED ORDER — SUGAMMADEX SODIUM 500 MG/5ML IV SOLN
INTRAVENOUS | Status: AC
  Filled 2016-10-13: qty 5

## 2016-10-13 MED ORDER — STERILE WATER FOR IRRIGATION IR SOLN
Status: DC | PRN
  Administered 2016-10-13: 1000 mL

## 2016-10-13 MED ORDER — SODIUM CHLORIDE 0.9 % IJ SOLN
INTRAMUSCULAR | Status: DC | PRN
  Administered 2016-10-13: 20 mL

## 2016-10-13 MED ORDER — FENTANYL CITRATE (PF) 100 MCG/2ML IJ SOLN
INTRAMUSCULAR | Status: AC
  Filled 2016-10-13: qty 2

## 2016-10-13 MED ORDER — HYDROMORPHONE HCL 1 MG/ML IJ SOLN: 0.2000 mg | INTRAMUSCULAR | Status: DC | PRN

## 2016-10-13 MED ORDER — ENOXAPARIN SODIUM 40 MG/0.4ML ~~LOC~~ SOLN
40.0000 mg | SUBCUTANEOUS | Status: AC
  Administered 2016-10-13: 40 mg via SUBCUTANEOUS
  Filled 2016-10-13: qty 0.4

## 2016-10-13 MED ORDER — GABAPENTIN 600 MG PO TABS
300.0000 mg | ORAL_TABLET | Freq: Every day | ORAL | Status: DC
  Filled 2016-10-13: qty 0.5

## 2016-10-13 MED ORDER — ONDANSETRON HCL 4 MG PO TABS: 4.0000 mg | ORAL_TABLET | Freq: Four times a day (QID) | ORAL | Status: DC | PRN

## 2016-10-13 MED ORDER — BUPIVACAINE HCL (PF) 0.25 % IJ SOLN
INTRAMUSCULAR | Status: AC
  Filled 2016-10-13: qty 30

## 2016-10-13 MED ORDER — LIDOCAINE 2% (20 MG/ML) 5 ML SYRINGE
INTRAMUSCULAR | Status: DC | PRN
  Administered 2016-10-13: 50 mg via INTRAVENOUS

## 2016-10-13 MED ORDER — LEVOTHYROXINE SODIUM 88 MCG PO TABS: 88.0000 ug | ORAL_TABLET | ORAL | Status: DC

## 2016-10-13 MED ORDER — ROCURONIUM BROMIDE 50 MG/5ML IV SOSY
PREFILLED_SYRINGE | INTRAVENOUS | Status: AC
  Filled 2016-10-13: qty 5

## 2016-10-13 MED ORDER — INSULIN ASPART 100 UNIT/ML ~~LOC~~ SOLN
0.0000 [IU] | Freq: Three times a day (TID) | SUBCUTANEOUS | Status: DC
  Administered 2016-10-13: 2 [IU] via SUBCUTANEOUS
  Administered 2016-10-14: 1 [IU] via SUBCUTANEOUS

## 2016-10-13 MED ORDER — FENTANYL CITRATE (PF) 100 MCG/2ML IJ SOLN
INTRAMUSCULAR | Status: DC | PRN
  Administered 2016-10-13: 50 ug via INTRAVENOUS
  Administered 2016-10-13: 25 ug via INTRAVENOUS
  Administered 2016-10-13: 100 ug via INTRAVENOUS
  Administered 2016-10-13: 75 ug via INTRAVENOUS
  Administered 2016-10-13: 50 ug via INTRAVENOUS

## 2016-10-13 MED ORDER — SUGAMMADEX SODIUM 500 MG/5ML IV SOLN
INTRAVENOUS | Status: DC | PRN
  Administered 2016-10-13: 300 mg via INTRAVENOUS

## 2016-10-13 MED ORDER — BUPIVACAINE HCL (PF) 0.25 % IJ SOLN
INTRAMUSCULAR | Status: DC | PRN
  Administered 2016-10-13: 20 mL

## 2016-10-13 MED ORDER — KCL IN DEXTROSE-NACL 20-5-0.45 MEQ/L-%-% IV SOLN
INTRAVENOUS | Status: DC
  Administered 2016-10-13: 17:00:00 via INTRAVENOUS
  Filled 2016-10-13 (×2): qty 1000

## 2016-10-13 MED ORDER — TRAMADOL HCL 50 MG PO TABS
100.0000 mg | ORAL_TABLET | Freq: Two times a day (BID) | ORAL | Status: DC | PRN
  Administered 2016-10-13: 100 mg via ORAL
  Filled 2016-10-13: qty 2

## 2016-10-13 MED ORDER — ONDANSETRON HCL 4 MG/2ML IJ SOLN
INTRAMUSCULAR | Status: AC
  Filled 2016-10-13: qty 2

## 2016-10-13 MED ORDER — SODIUM CHLORIDE 0.9 % IR SOLN
Status: DC | PRN
  Administered 2016-10-13: 1000 mL

## 2016-10-13 MED ORDER — GABAPENTIN 300 MG PO CAPS
300.0000 mg | ORAL_CAPSULE | Freq: Every day | ORAL | Status: AC
  Administered 2016-10-13: 300 mg via ORAL
  Filled 2016-10-13: qty 1

## 2016-10-13 MED ORDER — LEVOTHYROXINE SODIUM 88 MCG PO TABS
44.0000 ug | ORAL_TABLET | ORAL | Status: DC
  Administered 2016-10-14: 44 ug via ORAL

## 2016-10-13 MED ORDER — ONDANSETRON HCL 4 MG/2ML IJ SOLN: 4.0000 mg | Freq: Once | INTRAMUSCULAR | Status: DC | PRN

## 2016-10-13 MED ORDER — ENOXAPARIN SODIUM 40 MG/0.4ML ~~LOC~~ SOLN
40.0000 mg | SUBCUTANEOUS | Status: DC
  Administered 2016-10-14: 40 mg via SUBCUTANEOUS
  Filled 2016-10-13: qty 0.4

## 2016-10-13 MED ORDER — DEXAMETHASONE SODIUM PHOSPHATE 10 MG/ML IJ SOLN
INTRAMUSCULAR | Status: AC
  Filled 2016-10-13: qty 1

## 2016-10-13 MED ORDER — PRAVASTATIN SODIUM 20 MG PO TABS
10.0000 mg | ORAL_TABLET | Freq: Every evening | ORAL | Status: DC
  Administered 2016-10-13: 10 mg via ORAL
  Filled 2016-10-13: qty 1

## 2016-10-13 MED ORDER — ASPIRIN EC 81 MG PO TBEC
81.0000 mg | DELAYED_RELEASE_TABLET | Freq: Every day | ORAL | Status: DC
  Administered 2016-10-14: 81 mg via ORAL
  Filled 2016-10-13: qty 1

## 2016-10-13 MED ORDER — PROPOFOL 10 MG/ML IV BOLUS
INTRAVENOUS | Status: AC
  Filled 2016-10-13: qty 20

## 2016-10-13 MED ORDER — CEFAZOLIN SODIUM-DEXTROSE 2-4 GM/100ML-% IV SOLN
INTRAVENOUS | Status: AC
Start: 2016-10-13 — End: 2016-10-13
  Filled 2016-10-13: qty 100

## 2016-10-13 MED ORDER — INSULIN ASPART 100 UNIT/ML ~~LOC~~ SOLN
0.0000 [IU] | Freq: Every day | SUBCUTANEOUS | Status: DC
  Administered 2016-10-13: 2 [IU] via SUBCUTANEOUS

## 2016-10-13 MED ORDER — LACTATED RINGERS IV SOLN
INTRAVENOUS | Status: DC
  Administered 2016-10-13: 12:00:00 via INTRAVENOUS
  Administered 2016-10-13: 1000 mL via INTRAVENOUS

## 2016-10-13 MED ORDER — OXYCODONE-ACETAMINOPHEN 5-325 MG PO TABS: 1.0000 | ORAL_TABLET | ORAL | Status: DC | PRN

## 2016-10-13 MED ORDER — ONDANSETRON HCL 4 MG/2ML IJ SOLN
4.0000 mg | Freq: Four times a day (QID) | INTRAMUSCULAR | Status: DC | PRN
  Administered 2016-10-13 (×2): 4 mg via INTRAVENOUS
  Filled 2016-10-13 (×2): qty 2

## 2016-10-13 MED ORDER — ROCURONIUM BROMIDE 10 MG/ML (PF) SYRINGE
PREFILLED_SYRINGE | INTRAVENOUS | Status: DC | PRN
  Administered 2016-10-13: 45 mg via INTRAVENOUS
  Administered 2016-10-13 (×3): 5 mg via INTRAVENOUS

## 2016-10-13 MED ORDER — BUPIVACAINE LIPOSOME 1.3 % IJ SUSP
INTRAMUSCULAR | Status: DC | PRN
  Administered 2016-10-13: 20 mL

## 2016-10-13 MED ORDER — ONDANSETRON HCL 4 MG/2ML IJ SOLN
INTRAMUSCULAR | Status: DC | PRN
  Administered 2016-10-13: 4 mg via INTRAVENOUS

## 2016-10-13 MED ORDER — SODIUM CHLORIDE 0.9 % IJ SOLN
INTRAMUSCULAR | Status: AC
  Filled 2016-10-13: qty 50

## 2016-10-13 MED ORDER — FENTANYL CITRATE (PF) 100 MCG/2ML IJ SOLN: 25.0000 ug | INTRAMUSCULAR | Status: DC | PRN

## 2016-10-13 MED ORDER — PROPOFOL 10 MG/ML IV BOLUS
INTRAVENOUS | Status: DC | PRN
  Administered 2016-10-13: 70 mg via INTRAVENOUS
  Administered 2016-10-13: 120 mg via INTRAVENOUS

## 2016-10-13 MED ORDER — DEXAMETHASONE SODIUM PHOSPHATE 10 MG/ML IJ SOLN
INTRAMUSCULAR | Status: DC | PRN
  Administered 2016-10-13: 6 mg via INTRAVENOUS

## 2016-10-13 MED ORDER — CEFAZOLIN SODIUM-DEXTROSE 2-4 GM/100ML-% IV SOLN
2.0000 g | INTRAVENOUS | Status: AC
  Administered 2016-10-13: 2 g via INTRAVENOUS
  Filled 2016-10-13: qty 100

## 2016-10-13 MED ORDER — BUPIVACAINE LIPOSOME 1.3 % IJ SUSP
INTRAMUSCULAR | Status: AC
  Filled 2016-10-13: qty 20

## 2016-10-13 SURGICAL SUPPLY — 63 items
ADH SKN CLS APL DERMABOND .7 (GAUZE/BANDAGES/DRESSINGS)
APL SKNCLS STERI-STRIP NONHPOA (GAUZE/BANDAGES/DRESSINGS) ×2
BAG SPEC RTRVL LRG 6X4 10 (ENDOMECHANICALS) ×4
BENZOIN TINCTURE PRP APPL 2/3 (GAUZE/BANDAGES/DRESSINGS) ×2 IMPLANT
CELLS DAT CNTRL 66122 CELL SVR (MISCELLANEOUS) ×2 IMPLANT
CHLORAPREP W/TINT 26ML (MISCELLANEOUS) ×4 IMPLANT
CLOSURE WOUND 1/2 X4 (GAUZE/BANDAGES/DRESSINGS) ×2
COVER SURGICAL LIGHT HANDLE (MISCELLANEOUS) ×4 IMPLANT
COVER TIP SHEARS 8 DVNC (MISCELLANEOUS) ×2 IMPLANT
COVER TIP SHEARS 8MM DA VINCI (MISCELLANEOUS) ×4
DECANTER SPIKE VIAL GLASS SM (MISCELLANEOUS) ×2 IMPLANT
DERMABOND ADVANCED (GAUZE/BANDAGES/DRESSINGS)
DERMABOND ADVANCED .7 DNX12 (GAUZE/BANDAGES/DRESSINGS) IMPLANT
DRAPE ARM DVNC X/XI (DISPOSABLE) ×8 IMPLANT
DRAPE COLUMN DVNC XI (DISPOSABLE) ×2 IMPLANT
DRAPE DA VINCI XI ARM (DISPOSABLE) ×8
DRAPE DA VINCI XI COLUMN (DISPOSABLE) ×2
DRAPE SHEET LG 3/4 BI-LAMINATE (DRAPES) ×8 IMPLANT
DRAPE SURG IRRIG POUCH 19X23 (DRAPES) ×4 IMPLANT
DRESSING TELFA ISLAND 4X8 (GAUZE/BANDAGES/DRESSINGS) ×2 IMPLANT
DRSG TEGADERM 2-3/8X2-3/4 SM (GAUZE/BANDAGES/DRESSINGS) ×10 IMPLANT
ELECT REM PT RETURN 9FT ADLT (ELECTROSURGICAL) ×4
ELECTRODE REM PT RTRN 9FT ADLT (ELECTROSURGICAL) ×2 IMPLANT
GAUZE SPONGE 2X2 8PLY STRL LF (GAUZE/BANDAGES/DRESSINGS) IMPLANT
GLOVE BIO SURGEON STRL SZ 6.5 (GLOVE) ×15 IMPLANT
GLOVE BIO SURGEONS STRL SZ 6.5 (GLOVE) ×5
GLOVE BIOGEL PI IND STRL 7.0 (GLOVE) ×4 IMPLANT
GLOVE BIOGEL PI INDICATOR 7.0 (GLOVE) ×4
GOWN STRL REUS W/ TWL LRG LVL3 (GOWN DISPOSABLE) ×2 IMPLANT
GOWN STRL REUS W/ TWL XL LVL3 (GOWN DISPOSABLE) ×4 IMPLANT
GOWN STRL REUS W/TWL LRG LVL3 (GOWN DISPOSABLE) ×14 IMPLANT
GOWN STRL REUS W/TWL XL LVL3 (GOWN DISPOSABLE) ×8
HOLDER FOLEY CATH W/STRAP (MISCELLANEOUS) ×4 IMPLANT
IRRIG SUCT STRYKERFLOW 2 WTIP (MISCELLANEOUS) ×4
IRRIGATION SUCT STRKRFLW 2 WTP (MISCELLANEOUS) ×2 IMPLANT
MANIPULATOR UTERINE 4.5 ZUMI (MISCELLANEOUS) ×2 IMPLANT
OCCLUDER COLPOPNEUMO (BALLOONS) ×2 IMPLANT
PAD POSITIONING PINK XL (MISCELLANEOUS) ×4 IMPLANT
PEN SKIN MARKING BROAD (MISCELLANEOUS) ×2 IMPLANT
PORT ACCESS TROCAR AIRSEAL 12 (TROCAR) IMPLANT
PORT ACCESS TROCAR AIRSEAL 5M (TROCAR) ×2
POUCH SPECIMEN RETRIEVAL 10MM (ENDOMECHANICALS) ×4 IMPLANT
RETRACTOR WND ALEXIS 18 MED (MISCELLANEOUS) IMPLANT
RTRCTR WOUND ALEXIS 18CM MED (MISCELLANEOUS) ×4
SEAL CANN UNIV 5-8 DVNC XI (MISCELLANEOUS) ×8 IMPLANT
SEAL XI 5MM-8MM UNIVERSAL (MISCELLANEOUS) ×8
SET TRI-LUMEN FLTR TB AIRSEAL (TUBING) ×2 IMPLANT
SHEET LAVH (DRAPES) ×4 IMPLANT
SOLUTION ELECTROLUBE (MISCELLANEOUS) ×4 IMPLANT
SPONGE GAUZE 2X2 STER 10/PKG (GAUZE/BANDAGES/DRESSINGS) ×2
STRIP CLOSURE SKIN 1/2X4 (GAUZE/BANDAGES/DRESSINGS) ×2 IMPLANT
SUT VIC AB 0 CT1 27 (SUTURE) ×8
SUT VIC AB 0 CT1 27XBRD ANTBC (SUTURE) ×6 IMPLANT
SUT VIC AB 4-0 PS2 27 (SUTURE) ×8 IMPLANT
SYR 50ML LL SCALE MARK (SYRINGE) ×2 IMPLANT
TOWEL OR 17X26 10 PK STRL BLUE (TOWEL DISPOSABLE) ×4 IMPLANT
TOWEL OR NON WOVEN STRL DISP B (DISPOSABLE) ×4 IMPLANT
TRAP SPECIMEN MUCOUS 40CC (MISCELLANEOUS) ×2 IMPLANT
TRAY LAPAROSCOPIC (CUSTOM PROCEDURE TRAY) ×4 IMPLANT
TROCAR BLADELESS OPT 5 100 (ENDOMECHANICALS) ×2 IMPLANT
TROCAR XCEL 12X100 BLDLESS (ENDOMECHANICALS) ×4 IMPLANT
UNDERPAD 30X30 INCONTINENT (UNDERPADS AND DIAPERS) ×4 IMPLANT
WATER STERILE IRR 1500ML POUR (IV SOLUTION) ×4 IMPLANT

## 2016-10-13 NOTE — H&P (View-Only) (Signed)
GYNECOLOGIC ONCOLOGY NEW PATIENT CONSULTATION  Date of Service: 09/30/16 Referring Provider: Dr. [name] Consulting Provider: Marcello Fennel. Carlis Abbott, MD  HISTORY OF PRESENT ILLNESS: Tammy Hensley is a 75 y.o. woman who is seen in consultation at the request of Dr. [name] for evaluation of endometrial cancer.   The patient presented to her PCP with postmenopausal bleeding. A pap smear was obtained showing carcinoma. She was seen by Dr. [name] and underwent colposcopy with ECC and EMB. 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 ECC and cervical biopsy were benign.  She has a personal history of stage II breast cancer. She took tamoxifen for 5 years. She reports no bleeding or GYN complaint until ~10/20. She has continued occasional spotting. She denies changes in bowel or bladder habits. No weight loss or anorexia. She does report undergoing genetic testing with her breast cancer 11 years ago which was reportedly negative. She has two family members with prostate cancer (dad and brother) and no family history of colon, breast, uterine or ovarian cancer.   Regarding her functional status, she reports she is able to ambulate up a flight of stairs without shortness of breath and denies active cardiopulmonary complaint.  On ROS she denies N/V, headache, abdominal pain.  PAST MEDICAL HISTORY: Past Medical History:  Diagnosis Date  . Allergy   . Cancer ([place])    left breast  . Depression   . Diabetes mellitus without complication ([place])   . Hyperlipidemia   . Hypertension   . Hypothyroid   . Insomnia   . Iron deficiency anemia   . Thyroid disease   . Uterine cancer ([place])   . Vitamin D deficiency     PAST SURGICAL HISTORY: Past Surgical History:  Procedure Laterality Date  . MASTECTOMY Left 2006    OB/GYN HISTORY: OB History  Gravida Para Term Preterm AB Living  4 2 2   2 2   SAB TAB Ectopic Multiple Live Births  2       2    # Outcome Date GA Lbr  Len/2nd Weight Sex Delivery Anes PTL Lv  4 SAB           3 SAB           2 Term           1 Term             Obstetric Comments  SVDx2     Age at menopause: ~50 Hx of HRT: denies Hx of STDs: denies Last pap: 2017  History of abnormal pap smears: denies  MEDICATIONS:  Current Outpatient Prescriptions:  .  aspirin 81 MG tablet, Take 81 mg by mouth daily.  , Disp: , Rfl:  .  Calcium Carbonate-Vitamin D (CALCIUM 600 + D PO), Take by mouth 2 (two) times daily. , Disp: , Rfl:  .  levothyroxine (SYNTHROID, LEVOTHROID) 88 MCG tablet, Take 1 tablet (88 mcg total) by mouth daily before breakfast. (Patient taking differently: Take 88 mcg by mouth daily before breakfast. 1/2 pill M, W ,F and Sat), Disp: 90 tablet, Rfl: 1 .  metFORMIN (GLUCOPHAGE-XR) 500 MG 24 hr tablet, TAKE ONE TABLET BY MOUTH WITH BREAKFAST AND LUNCH AND TAKE TWO TABLETS BY MOUTH WITH SUPPER FOR DAIBETES., Disp: 360 tablet, Rfl: 1 .  Multiple Vitamins-Minerals (MULTIVITAMIN PO), Take by mouth., Disp: , Rfl:  .  pravastatin (PRAVACHOL) 40 MG tablet, Take 10 mg by mouth daily. , Disp: , Rfl:  .  triamterene-hydrochlorothiazide (MAXZIDE)  75-50 MG tablet, TAKE ONE TABLET BY MOUTH ONCE DAILY FOR BLOOD PRESSURE AND  FLUID (Patient taking differently: TAKE ONE TABLET BY MOUTH ONCE DAILY FOR BLOOD PRESSURE AND  FLUID), Disp: 90 tablet, Rfl: 3 .  VITAMIN D, ERGOCALCIFEROL, PO, Take 4,000 Int'l Units by mouth daily. , Disp: , Rfl:   ALLERGIES: Allergies  Allergen Reactions  . Calan [Verapamil]     Constipation   . Effexor [Venlafaxine]   . Erythromycin   . Femara [Letrozole]   . Fosamax [Alendronate]     aching  . Ibuprofen   . Paxil [Paroxetine Hcl] Nausea Only  . Remeron [Mirtazapine]     Weight gain   . Tamoxifen     Hair loss @ low dose  . Vasotec [Enalapril] Cough    FAMILY HISTORY: Family History  Problem Relation Age of Onset  . Heart disease Mother   . Heart attack Mother   . Cancer Father   . Heart attack  Father     SOCIAL HISTORY: Social History   Social History  . Marital status: Married    Spouse name: N/A  . Number of children: N/A  . Years of education: N/A   Occupational History  . Not on file.   Social History Main Topics  . Smoking status: Never Smoker  . Smokeless tobacco: Never Used  . Alcohol use No  . Drug use: Unknown  . Sexual activity: Not on file   Other Topics Concern  . Not on file   Social History Narrative  . No narrative on file    REVIEW OF SYSTEMS: Complete 10-system review is negative except as per HPI.  PHYSICAL EXAM: BP (!) 150/86 (BP Location: Right Arm, Patient Position: Sitting) Comment: Left the nurse note making her aware of elevated BP  Pulse 94   Temp 98 F (36.7 C) (Oral)   Resp 17   Ht 5' 2.5" (1.588 m)   Wt 150 lb 3.2 oz (68.1 kg)   SpO2 95%   BMI 27.03 kg/m  General: Alert, oriented, no acute distress. HEENT: Normocephalic, atraumatic.  Sclera anicteric, posterior oropharynx clear.  Normal dentition. Chest/pulm: Clear to auscultation bilaterally. Cardiovascular: Regular rate and rhythm, no murmurs, rubs, or gallops. Abdomen/GI: Normoactive bowel sounds.  Soft, nondistended, nontender to palpation.  No masses or hepatosplenomegaly appreciated.  No evidence of hernia.  No palpable fluid wave.  No incisions. Extremities: Grossly normal range of motion.  Warm, well perfused.  No edema bilaterally. Skin: No rashes or lesions. Lymphatics: No cervical, supraclavicular, or inguinal adenopathy. GU: External genitalia without lesions.  On speculum exam, normal appearing cervix without lesion.  Bimanual exam reveals mildly enlarged uterus, midposition about 12-14cm with fullness on right and some voluntary guarding limiting the exam. No nodularity. Rectovaginal exam confirms the above findings and reveals no nodularity.  LABORATORY AND RADIOLOGIC DATA: Outside medical records were reviewed to synthesize the above history, along with the  history and physical obtained during the visit.  Outside laboratory, pathology, and imaging reports were reviewed, with pertinent results below.  I personally reviewed the images.  CT 10/24: FINDINGS: Lower chest: Mild dependent atelectasis is seen in the lung bases. No pleural or pericardial effusion. Postoperative change is noted in the left breast where 3-4 small surgical clips are identified.  Hepatobiliary: Unremarkable.  Pancreas: Unremarkable.  Spleen: Unremarkable.  Adrenals/Urinary Tract: Cyst in the upper pole of the left kidney measures 2.0 cm in diameter. Cyst in lower pole of the right kidney measures 0.8  cm in diameter. Otherwise unremarkable in appearance.  Stomach/Bowel: Unremarkable.  Vascular/Lymphatic: Aortoiliac atherosclerosis without aneurysm is identified.  Reproductive: The endometrial stripe is markedly thickened at 3.5 cm with marked heterogeneity identified. Large calcified fibroid is noted. Cystic lesion in the right ovary measures 3.5 by 2.9 by 3.9 cm. Left adnexa is unremarkable.  Other: No lymphadenopathy or fluid.  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 Mild degenerative disease is noted about the hips.  IMPRESSION: Markedly thickened and heterogeneous endometrial stripe suspicious for endometrial carcinoma in this patient with postmenopausal vaginal bleeding.  Cystic lesion in the right ovary cannot be definitively characterized. Pelvic ultrasound is recommended for further evaluation. This recommendation follows ACR consensus guidelines: White Paper of the ACR Incidental Findings Committee II on Adnexal Findings. J Am Coll Radiol [phone].  Aortoiliac atherosclerosis.  Avascular necrosis of the femoral heads bilaterally.  ASSESSMENT AND PLAN: Tammy Hensley is a 75 y.o. 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  We reviewed the  nature of endometrial cancer and its recommended surgical treatment, including total hysterectomy, BSO, and lymph node assessment.  We discussed that the grade and histology seen on her endometrial biopsy often requires additional adjuvant therapy with chemotherapy but that treatment planning is dictated by the final pathology results. The patient is a suitable candidate for staging via a minimally invasive approach to surgery, but we reviewed a potential need for laparotomy for specimen retrieval given fibroids on exam.  We reviewed that robotic assistance would be used to complete the surgery.    We discussed the need for preoperative chest xray given CT only included the abdomen and pelvis. We will also obtain a Ca125 with preoperative labs.  We reviewed the sentinel lymph node technique. Risks and benefits of sentinel lymph node biopsy was reviewed. We reviewed the technique and ICG dye. The patient DOES NOT have an iodine allergy or known liver dysfunction. We reviewed the false negative rate (0.4%), and that 3% of patients with metastatic disease will not have it detected by SLN biopsy in endometrial cancer. A low risk of allergic reaction to the dye, <0.2% for ICG, has been reported. We also discussed that in the case of failed mapping, which can occur up to 40% of the time, a bilateral or unilateral lymphadenectomy will be performed at the surgeon's discretion.   Potential benefits of sentinel nodes including a higher detection rate for metastasis due to ultrastaging and potential reduction in operative morbidity. However, there remains uncertainty as to the role for treatment of micrometastatic disease. Further, the benefit of operative morbidity associated with the SLN technique in endometrial cancer is not yet completely known. In other patient populations (e.g. the cervical cancer population) there has been observed reductions in morbidity with SLN biopsy compared to pelvic lymphadenectomy.  Lymphedema, nerve dysfunction and lymphocysts are all potential risks with the SLN technique as with complete lymphadenectomy. Additional risks to the patient include the risk of damage to an internal organ while operating in an altered view (e.g. the black and white image of the robotic fluorescence imaging mode).   We reviewed the planned surgery in detail. She is scheduled for robotic TLH/BSO/sentinel Ln, possible complete lymphadenectomy, possible laparotomy on 11/21 with Dr. Nancy Marus.  The risks of surgery were discussed in detail and she understands these to including but not limited to bleeding, blood transfusion, infection, wound separation, injury to adjacent organs, anesthesia risk, thromboembolic events, lymphedema, conversion to  laparotomy, unforseen complications and possible need for re-exploration. She voiced a clear understanding. She had the opportunity to ask questions and she wishes to proceed.  The patient reports METs >4 and will not require further clearance.  Pre-operative and post-operative instructions and expectations were reviewed with the patient in detail. All her questions were answered to her satisfaction.  A copy of this note was sent to the patient's referring provider. I appreciate the opportunity to be involved in the care of this patient.  Marcello Fennel. Carlis Abbott, MD

## 2016-10-13 NOTE — Anesthesia Preprocedure Evaluation (Addendum)
Anesthesia Evaluation  Patient identified by MRN, date of birth, ID band Patient awake    Reviewed: Allergy & Precautions, NPO status , Patient's Chart, lab work & pertinent test results  Airway Mallampati: III  TM Distance: >3 FB Neck ROM: Full    Dental  (+) Teeth Intact, Dental Advisory Given   Pulmonary neg pulmonary ROS,    Pulmonary exam normal breath sounds clear to auscultation       Cardiovascular hypertension, Pt. on medications Normal cardiovascular exam Rhythm:Regular Rate:Normal     Neuro/Psych PSYCHIATRIC DISORDERS Depression negative neurological ROS     GI/Hepatic negative GI ROS, Neg liver ROS,   Endo/Other  diabetes, Type 2, Oral Hypoglycemic AgentsHypothyroidism   Renal/GU negative Renal ROS     Musculoskeletal negative musculoskeletal ROS (+)   Abdominal   Peds  Hematology negative hematology ROS (+)   Anesthesia Other Findings Day of surgery medications reviewed with the patient.  Left breast cancer  Reproductive/Obstetrics Uterine cancer                            Anesthesia Physical Anesthesia Plan  ASA: III  Anesthesia Plan: General   Post-op Pain Management:    Induction: Intravenous  Airway Management Planned: Oral ETT  Additional Equipment:   Intra-op Plan:   Post-operative Plan: Extubation in OR  Informed Consent: I have reviewed the patients History and Physical, chart, labs and discussed the procedure including the risks, benefits and alternatives for the proposed anesthesia with the patient or authorized representative who has indicated his/her understanding and acceptance.   Dental advisory given  Plan Discussed with: CRNA  Anesthesia Plan Comments: (Risks/benefits of general anesthesia discussed with patient including risk of damage to teeth, lips, gum, and tongue, nausea/vomiting, allergic reactions to medications, and the possibility of  heart attack, stroke and death.  All patient questions answered.  Patient wishes to proceed.)        Anesthesia Quick Evaluation

## 2016-10-13 NOTE — Transfer of Care (Signed)
Immediate Anesthesia Transfer of Care Note  Patient: Tammy Hensley  Procedure(s) Performed: Procedure(s): XI ROBOTIC ASSISTED LAPAROSCOPIC VAGINAL HYSTERECTOMY (N/A) XI ROBOTIC ASSISTED SALPINGO OOPHORECTOMY (Bilateral) SENTINEL NODE BIOPSY (N/A)  Patient Location: PACU  Anesthesia Type:General  Level of Consciousness:  sedated, patient cooperative and responds to stimulation  Airway & Oxygen Therapy:Patient Spontanous Breathing and Patient connected to face mask oxgen  Post-op Assessment:  Report given to PACU RN and Post -op Vital signs reviewed and stable  Post vital signs:  Reviewed and stable  Last Vitals:  Vitals:   10/13/16 0828  BP: (!) 147/77  Pulse: 88  Resp: 16  Temp: 123XX123 C    Complications: No apparent anesthesia complications

## 2016-10-13 NOTE — Anesthesia Postprocedure Evaluation (Signed)
Anesthesia Post Note  Patient: Tammy Hensley  Procedure(s) Performed: Procedure(s) (LRB): XI ROBOTIC ASSISTED LAPAROSCOPIC VAGINAL HYSTERECTOMY (N/A) XI ROBOTIC ASSISTED SALPINGO OOPHORECTOMY (Bilateral) SENTINEL NODE BIOPSY (N/A)  Patient location during evaluation: PACU Anesthesia Type: General Level of consciousness: sedated Pain management: satisfactory to patient Vital Signs Assessment: post-procedure vital signs reviewed and stable Respiratory status: spontaneous breathing Cardiovascular status: stable Anesthetic complications: no    Last Vitals:  Vitals:   10/13/16 1630 10/13/16 1736  BP: (!) 152/86 (!) 148/77  Pulse: 89 83  Resp: 12 14  Temp: 37.1 C 36.6 C    Last Pain:  Vitals:   10/13/16 1747  TempSrc:   PainSc: Asleep                 Chaynce Schafer EDWARD

## 2016-10-13 NOTE — Anesthesia Procedure Notes (Signed)
Procedure Name: Intubation Date/Time: 10/13/2016 10:59 AM Performed by: Uriah Philipson, Virgel Gess Pre-anesthesia Checklist: Patient identified, Emergency Drugs available, Suction available, Patient being monitored and Timeout performed Patient Re-evaluated:Patient Re-evaluated prior to inductionOxygen Delivery Method: Circle system utilized Preoxygenation: Pre-oxygenation with 100% oxygen Intubation Type: IV induction Ventilation: Mask ventilation without difficulty Laryngoscope Size: Mac and 4 Grade View: Grade III Tube type: Oral Tube size: 7.0 mm Number of attempts: 1 Airway Equipment and Method: Stylet Placement Confirmation: ETT inserted through vocal cords under direct vision,  positive ETCO2,  CO2 detector and breath sounds checked- equal and bilateral Secured at: 21 cm Tube secured with: Tape Dental Injury: Teeth and Oropharynx as per pre-operative assessment

## 2016-10-13 NOTE — Op Note (Signed)
PATIENT: Tammy Hensley DATE OF BIRTH: 09-Nov-1941 ENCOUNTER DATE: 10/13/2016   Preop Diagnosis: Uterine serous carcinoma  Postoperative Diagnosis: same.   Surgery: Total robotic hysterectomy bilateral salpingo-oophorectomy, bilateral pelvic sentinel lymph node removal, left PA sentinel lymph node removal. Mini-laparotomy for specimen removal  Surgeons:  Mahiya Kercheval A. Alycia Rossetti, MD; [name], MD   Anesthesia: General   Estimated blood loss: 50 ml   IVF: 1300 ml   Urine output: [id number]  ml   Complications: None   Pathology:  1)Uterus, cervix, bilateral tubes and ovaries 2) Right obturator SLN 3) Left Obturator SLN 4) Left PA SLN  Operative findings: 14 week fibroid uterus with one large dominant 6 cm myoma that was palpably calcified. 4 cm right ovarian cyst. + SLN identified in the bilateral obturator spaces, + SLN identified in the left PA space.  Procedure: The patient was identified in the preoperative holding area. Informed consent was signed on the chart. Patient was seen history was reviewed and exam was performed.   The patient was then taken to the operating room and placed in the supine position with SCD hose on. She was then placed in the dorsolithotomy position. Her arms were tucked at her side with appropriate precautions on the gel pad. General anesthesia was then induced without difficulty. Shoulder blocks were then placed in the usual fashion with appropriate precautions. A OG-tube was placed to suction. First timeout was performed to confirm the patient, procedure, antibiotic, allergy status, estimated blood loss and OR time. The perineum was then prepped in the usual fashion with Betadine. A 14 French Foley was inserted into the bladder under sterile conditions.   A sterile speculum was placed in the vagina. The cervix was without lesions. The cervix was grasped with a single-tooth tenaculum. 25 mg ICG was diluted in 5 ML saline. 1 ml was injected 1 cm deep in both  the 3:00 and 9:00 positions. The cervix was dilated without difficulty. A ZUMI with a medium Koe ring was placed without difficulty. The abdomen was then prepped with 1 Chlor prep sponge per protocol.   Patient was then draped after the prep was dried. Second timeout was performed to confirm the above. After again confirming OG tube placement and it was to suction. A stab-wound was made in left upper quadrant 2 cm below the costal margin on the left in the midclavicular line. A 5 mm operative report was used to assure intra-abdominal placement. The abdomen was insufflated. At this point all points during the procedure the patient's intra-abdominal pressure was not increased over 15 mm of mercury. After insufflation was complete, the patient was placed in deep Trendelenburg position. 25 cm above the pubic symphysis that area was marked the camera port. Bilateral robotic ports were marked 10 cm from the midline incision at approximately 5 angle, and a 4th LLQ port was placed. Under direct visualization each of the trochars was placed into the abdomen. The small bowel was folded on its mesentery to allow visualization to the pelvis. The 5 mm LUQ port was then converted to a 10/12 port under direct visualization.  After assuring adequate visualization, the robot was then docked in the usual fashion. Under direct visualization the robotic instruments replaced.   The round ligament on the patient's right side was transected with monopolar cautery. The anterior and posterior leaves of the broad ligament were then taken down in the usual fashion. The ureter was identified on the medial leaf of the broad ligament. The paravesical and perirectal  spaces were opened. A channel was identified. The obturator nerve was identified and isolated. The nodal bundle was removed from under the right external iliac vein at the bifurcation. It was placed in an endocatch bag. The same procedure was performed on the left and a similar  node was found in the identical location as on the right. It was smaller and was removed through the assistant port.  The peritoneum over the right common iliac artery was opened and the ureter on the right was identified. There was no sentinel node in this location. The incision was carried to above the level of the IMA along the aorta. The left PA space was opened and the ureter was identified. The ureter was deviated laterally by the assistant grasper. A left PA node was identified with ICG. It was removed from the left side of the PA. There was adequate hemostasis and the ureter was well lateral. It was identified again and noted to be peristalsing. The node was removed through the assistant port.   A window was made between the IP and the ureter on the right. The IP was coagulated with bipolar cautery and transected. The posterior leaf of the broad ligament was taken down to the level of the KOH ring. The bladder flap was created using meticulous dissection and pinpoint cautery. The uterine vessels were coagulated with bipolar cautery. The uterine vessels were then transected and the C loop was created. The same procedure was performed on the patient's left side.   The pneumo-occulder in the vagina was then insufflated. The colpotomy was then created in the usual fashion.  Cautery. The vaginal cuff was closed with a running 0 Vicryl on CT 1 suture. The abdomen and pelvis were copiously irrigated and noted to be hemostatic. The robotic instruments were removed under direct visualization as were the robotic trochars. The pneumoperitoneum was removed. The patient was then taken out of the Trendelenburg position.   As the specimen was too large to remove through the vagina, a 6 cm transverse incision was made with the knife and carried down to the underlying fascia with the bovie. The fascia was scored and the incision was extended. The rectus bellies were dissected off of the fascia and the peritoneum was  opened. The uterine specimen was grasped and delivered through the abdominal wall.   The omentum was brought down into the pelvis. The fascia was closed with a 0 vicryl continuous suture. Exparel diluted in 20 ml saline was injected for post-operative pain control in the usual fashion. The subcutaneous tissues of the port in the left upper quadrant was reapproximated with a 0 vicryl on a UR-6. The skin for all incisions was closed using 4-0 Vicryl. Steri-Strips and benzoin were applied. The pneumo occluded balloon was removed from the vagina. The vagina was swabbed and noted to be hemostatic.   All instrument needle and Ray-Tec counts were correct x2. The patient tolerated the procedure well and was taken to the recovery room in stable condition. This is Nancy Marus dictating an operative note on patient Tammy Hensley.

## 2016-10-13 NOTE — Interval H&P Note (Signed)
History and Physical Interval Note:  10/13/2016 10:08 AM  Tammy Hensley  has presented today for surgery, with the diagnosis of ENDOMETRIAL CANCER  The various methods of treatment have been discussed with the patient and family. After consideration of risks, benefits and other options for treatment, the patient has consented to  Procedure(s): XI ROBOTIC [place] (N/A) XI ROBOTIC ASSISTED SALPINGO OOPHORECTOMY (Bilateral) SENTINEL NODE BIOPSY (N/A) as a surgical intervention .  The patient's history has been reviewed, patient examined, no change in status, stable for surgery.  I have reviewed the patient's chart and labs.  Questions were answered to the patient's satisfaction.     [name]

## 2016-10-14 ENCOUNTER — Encounter (HOSPITAL_COMMUNITY): Payer: Self-pay | Admitting: Gynecologic Oncology

## 2016-10-14 DIAGNOSIS — C541 Malignant neoplasm of endometrium: Secondary | ICD-10-CM | POA: Diagnosis not present

## 2016-10-14 LAB — BASIC METABOLIC PANEL
Anion gap: 8 (ref 5–15)
BUN: 9 mg/dL (ref 6–20)
CALCIUM: 8.4 mg/dL — AB (ref 8.9–10.3)
CO2: 26 mmol/L (ref 22–32)
CREATININE: 0.65 mg/dL (ref 0.44–1.00)
Chloride: 98 mmol/L — ABNORMAL LOW (ref 101–111)
GFR calc Af Amer: >60 mL/min (ref >60)
Glucose, Bld: 195 mg/dL — ABNORMAL HIGH (ref 65–99)
POTASSIUM: 3.4 mmol/L — AB (ref 3.5–5.1)
SODIUM: 132 mmol/L — AB (ref 135–145)

## 2016-10-14 LAB — CBC
HCT: 33.1 % — ABNORMAL LOW (ref 36.0–46.0)
Hemoglobin: 11.1 g/dL — ABNORMAL LOW (ref 12.0–15.0)
MCH: 26.3 pg (ref 26.0–34.0)
MCHC: 33.5 g/dL (ref 30.0–36.0)
MCV: 78.4 fL (ref 78.0–100.0)
PLATELETS: 279 10*3/uL (ref 150–400)
RBC: 4.22 MIL/uL (ref 3.87–5.11)
RDW: 14 % (ref 11.5–15.5)
WBC: 8.2 10*3/uL (ref 4.0–10.5)

## 2016-10-14 LAB — GLUCOSE, CAPILLARY: Glucose-Capillary: 133 mg/dL — ABNORMAL HIGH (ref 65–99)

## 2016-10-14 MED ORDER — TRAMADOL HCL 50 MG PO TABS: 50.0000 mg | ORAL_TABLET | Freq: Four times a day (QID) | ORAL | 0 refills | Status: DC | PRN

## 2016-10-14 NOTE — Progress Notes (Signed)
DC instructions reviewed with the pt and with her family at the bedside. All questions and concerns addressed. Pt in NAD, no c/o pain, incision sites stained marked, WNL, all other skin intact. DC home with family after shower in room. Will continue to monitor until left the unit.

## 2016-10-14 NOTE — Discharge Summary (Signed)
Physician Discharge Summary  Patient ID: Tammy Hensley MRN: [id number] DOB/AGE: 03/02/1941 75 y.o.  Admit date: 10/13/2016 Discharge date: 10/14/2016  Admission Diagnoses: Endometrial cancer [facility])  Discharge Diagnoses:  Principal Problem:   Endometrial cancer [facility])   Discharged Condition:  The patient is in good condition and stable for discharge.    Hospital Course: On 10/13/2016, the patient underwent the following: Procedure(s): XI ROBOTIC ASSISTED LAPAROSCOPIC VAGINAL HYSTERECTOMY XI ROBOTIC ASSISTED SALPINGO OOPHORECTOMY SENTINEL NODE BIOPSY.   The postoperative course was uneventful.  She was discharged to home on postoperative day 1 tolerating a regular diet, ambulating, voiding, pain controlled.  Consults: None  Significant Diagnostic Studies: None  Treatments: surgery: see above  Discharge Exam: Blood pressure (!) 150/73, pulse 78, temperature 99 F (37.2 C), temperature source Oral, resp. rate 18, height 5\' 2"  (1.575 m), weight 148 lb (67.1 kg), SpO2 100 %. General appearance: alert, cooperative and no distress Resp: clear to auscultation bilaterally Cardio: regular rate and rhythm, S1, S2 normal, no murmur, click, rub or gallop GI: soft, non-tender; bowel sounds normal; no masses,  no organomegaly Extremities: extremities normal, atraumatic, no cyanosis or edema Incision/Wound: lap sites with steri strips in place, no active drainage or bleeding noted  Disposition: Home  Discharge Instructions    Call MD for:  difficulty breathing, headache or visual disturbances    Complete by:  As directed    Call MD for:  extreme fatigue    Complete by:  As directed    Call MD for:  hives    Complete by:  As directed    Call MD for:  persistant dizziness or light-headedness    Complete by:  As directed    Call MD for:  persistant nausea and vomiting    Complete by:  As directed    Call MD for:  redness, tenderness, or signs of infection (pain, swelling, redness, odor  or green/yellow discharge around incision site)    Complete by:  As directed    Call MD for:  severe uncontrolled pain    Complete by:  As directed    Call MD for:  temperature >100.4    Complete by:  As directed    Diet - low sodium heart healthy    Complete by:  As directed    Driving Restrictions    Complete by:  As directed    No driving for 1 week if you were cleared to drive previously.  Do not take narcotics and drive.   Increase activity slowly    Complete by:  As directed    Lifting restrictions    Complete by:  As directed    No lifting greater than 10 lbs.   Sexual Activity Restrictions    Complete by:  As directed    No sexual activity, nothing in the vagina, for 8 weeks.       Medication List    TAKE these medications   acetaminophen 500 MG tablet Commonly known as:  TYLENOL Take 500 mg by mouth daily as needed for moderate pain or headache.   aspirin 81 MG tablet Take 81 mg by mouth daily.   CALCIUM 600 + D PO Take 1 tablet by mouth 2 (two) times daily.   CLARITIN-D 24 HOUR 10-240 MG 24 hr tablet Generic drug:  loratadine-pseudoephedrine Take 1 tablet by mouth daily as needed for allergies.   levothyroxine 88 MCG tablet Commonly known as:  SYNTHROID, LEVOTHROID Take 1 tablet (88 mcg total) by mouth daily  before breakfast. What changed:  how much to take  additional instructions   metFORMIN 500 MG 24 hr tablet Commonly known as:  GLUCOPHAGE-XR TAKE ONE TABLET BY MOUTH WITH BREAKFAST AND LUNCH AND TAKE TWO TABLETS BY MOUTH WITH SUPPER FOR DAIBETES. What changed:  See the new instructions.   MULTIVITAMIN PO Take 1 tablet by mouth daily.   pravastatin 40 MG tablet Commonly known as:  PRAVACHOL Take 10 mg by mouth every evening.   traMADol 50 MG tablet Commonly known as:  ULTRAM Take 1 tablet (50 mg total) by mouth every 6 (six) hours as needed.   triamterene-hydrochlorothiazide 75-50 MG tablet Commonly known as:  MAXZIDE TAKE ONE TABLET BY  MOUTH ONCE DAILY FOR BLOOD PRESSURE AND  FLUID What changed:  how much to take  how to take this  when to take this  additional instructions   Vitamin D 2000 units Caps Take 2,000 Units by mouth every other day.      Follow-up Information    GEHRIG,PAOLA A., MD Follow up on 11/11/2016.   Specialty:  Gynecologic Oncology Why:  at 11:15am at the [facility] information: [place] [address] [phone]           Greater than thirty minutes were spend for face to face discharge instructions and discharge orders/summary in EPIC.   Signed: Jaidy Cottam DEAL 10/14/2016, 9:50 AM

## 2016-10-14 NOTE — Discharge Instructions (Signed)
10/14/2016  Return to work: 4-6 weeks if applicable  Activity: 1. Be up and out of the bed during the day.  Take a nap if needed.  You may walk up steps but be careful and use the hand rail.  Stair climbing will tire you more than you think, you may need to stop part way and rest.   2. No lifting or straining for 6 weeks.  3. No driving for 1 week(s).  Do not drive if you are taking narcotic pain medicine.  4. Shower daily.  Use soap and water on your incision and pat dry; don't rub.  No tub baths until cleared by your surgeon.   5. No sexual activity and nothing in the vagina for 8 weeks.  6. You may experience a small amount of clear drainage from your incisions, which is normal.  If the drainage persists or increases, please call the office.  7. You may experience vaginal spotting after surgery or around the 6-8 week mark from surgery when the stitches at the top of the vagina begin to dissolve.  The spotting is normal but if you experience heavy bleeding, call our office.  Diet: 1. Low sodium Heart Healthy Diet is recommended.  2. It is safe to use a laxative, such as Miralax or Colace, if you have difficulty moving your bowels.   Wound Care: 1. Keep clean and dry.  Shower daily.  Reasons to call the Doctor:  Fever - Oral temperature greater than 100.4 degrees Fahrenheit  Foul-smelling vaginal discharge  Difficulty urinating  Nausea and vomiting  Increased pain at the site of the incision that is unrelieved with pain medicine.  Difficulty breathing with or without chest pain  New calf pain especially if only on one side  Sudden, continuing increased vaginal bleeding with or without clots.   Contacts: For questions or concerns you should contact:  Dr. Everitt Amber at [phone]  Joylene John, NP at [phone]  After Hours: call [phone] and have the GYN Oncologist paged/contacted  Tramadol tablets What is this medicine? TRAMADOL (TRA ma dole) is a  pain reliever. It is used to treat moderate to severe pain in adults. This medicine may be used for other purposes; ask your health care provider or pharmacist if you have questions. COMMON BRAND NAME(S): Ultram What should I tell my health care provider before I take this medicine? They need to know if you have any of these conditions: -brain tumor -depression -drug abuse or addiction -head injury -if you frequently drink alcohol containing drinks -kidney disease or trouble passing urine -liver disease -lung disease, asthma, or breathing problems -seizures or epilepsy -suicidal thoughts, plans, or attempt; a previous suicide attempt by you or a family member -an unusual or allergic reaction to tramadol, codeine, other medicines, foods, dyes, or preservatives -pregnant or trying to get pregnant -breast-feeding How should I use this medicine? Take this medicine by mouth with a full glass of water. Follow the directions on the prescription label. You can take it with or without food. If it upsets your stomach, take it with food. Do not take your medicine more often than directed. A special MedGuide will be given to you by the pharmacist with each prescription and refill. Be sure to read this information carefully each time. Talk to your pediatrician regarding the use of this medicine in children. Special care may be needed. Overdosage: If you think you have taken too much of this medicine contact a poison control center or emergency  room at once. NOTE: This medicine is only for you. Do not share this medicine with others. What if I miss a dose? If you miss a dose, take it as soon as you can. If it is almost time for your next dose, take only that dose. Do not take double or extra doses. What may interact with this medicine? Do not take this medication with any of the following medicines: -MAOIs like Carbex, Eldepryl, Marplan, Nardil, and Parnate This medicine may also interact with the  following medications: -alcohol -antihistamines for allergy, cough and cold -certain medicines for anxiety or sleep -certain medicines for depression like amitriptyline, fluoxetine, sertraline -certain medicines for migraine headache like almotriptan, eletriptan, frovatriptan, naratriptan, rizatriptan, sumatriptan, zolmitriptan -certain medicines for seizures like carbamazepine, oxcarbazepine, phenobarbital, primidone -certain medicines that treat or prevent blood clots like warfarin -digoxin -furazolidone -general anesthetics like halothane, isoflurane, methoxyflurane, propofol -linezolid -local anesthetics like lidocaine, pramoxine, tetracaine -medicines that relax muscles for surgery -other narcotic medicines for pain or cough -phenothiazines like chlorpromazine, mesoridazine, prochlorperazine, thioridazine -procarbazine This list may not describe all possible interactions. Give your health care provider a list of all the medicines, herbs, non-prescription drugs, or dietary supplements you use. Also tell them if you smoke, drink alcohol, or use illegal drugs. Some items may interact with your medicine. What should I watch for while using this medicine? Tell your doctor or health care professional if your pain does not go away, if it gets worse, or if you have new or a different type of pain. You may develop tolerance to the medicine. Tolerance means that you will need a higher dose of the medicine for pain relief. Tolerance is normal and is expected if you take this medicine for a long time. Do not suddenly stop taking your medicine because you may develop a severe reaction. Your body becomes used to the medicine. This does NOT mean you are addicted. Addiction is a behavior related to getting and using a drug for a non-medical reason. If you have pain, you have a medical reason to take pain medicine. Your doctor will tell you how much medicine to take. If your doctor wants you to stop the  medicine, the dose will be slowly lowered over time to avoid any side effects. There are different types of narcotic medicines (opiates). If you take more than one type at the same time or if you are taking another medicine that also causes drowsiness, you may have more side effects. Give your health care provider a list of all medicines you use. Your doctor will tell you how much medicine to take. Do not take more medicine than directed. Call emergency for help if you have problems breathing or unusual sleepiness. You may get drowsy or dizzy. Do not drive, use machinery, or do anything that needs mental alertness until you know how this medicine affects you. Do not stand or sit up quickly, especially if you are an older patient. This reduces the risk of dizzy or fainting spells. Alcohol can increase or decrease the effects of this medicine. Avoid alcoholic drinks. You may have constipation. Try to have a bowel movement at least every 2 to 3 days. If you do not have a bowel movement for 3 days, call your doctor or health care professional. Your mouth may get dry. Chewing sugarless gum or sucking hard candy, and drinking plenty of water may help. Contact your doctor if the problem does not go away or is severe. What side effects may I  notice from receiving this medicine? Side effects that you should report to your doctor or health care professional as soon as possible: -allergic reactions like skin rash, itching or hives, swelling of the face, lips, or tongue -breathing problems -confusion -seizures -signs and symptoms of low blood pressure like dizziness; feeling faint or lightheaded, falls; unusually weak or tired -trouble passing urine or change in the amount of urine Side effects that usually do not require medical attention (report to your doctor or health care professional if they continue or are bothersome): -constipation -dry mouth -nausea, vomiting -tiredness This list may not describe all  possible side effects. Call your doctor for medical advice about side effects. You may report side effects to FDA at 1-800-FDA-1088. Where should I keep my medicine? Keep out of the reach of children. This medicine may cause accidental overdose and death if it taken by other adults, children, or pets. Mix any unused medicine with a substance like cat litter or coffee grounds. Then throw the medicine away in a sealed container like a sealed bag or a coffee can with a lid. Do not use the medicine after the expiration date. Store at room temperature between 15 and 30 degrees C (59 and 86 degrees F). NOTE: This sheet is a summary. It may not cover all possible information. If you have questions about this medicine, talk to your doctor, pharmacist, or health care provider.  2017 Elsevier/Gold Standard (2015-08-04 09:00:04)

## 2016-10-18 ENCOUNTER — Other Ambulatory Visit: Payer: Self-pay | Admitting: Oncology

## 2016-10-20 ENCOUNTER — Other Ambulatory Visit: Payer: Self-pay | Admitting: Gynecologic Oncology

## 2016-10-20 ENCOUNTER — Telehealth: Payer: Self-pay

## 2016-10-20 DIAGNOSIS — C541 Malignant neoplasm of endometrium: Secondary | ICD-10-CM

## 2016-10-20 NOTE — Telephone Encounter (Signed)
Pt c/o last BM 5 days ago. Pt has been using Miralax daily w.o results. Pt encourged to purchase Mag Citrate today and use today. Advised that she can use Miralax bid if needed. Pt verbalized understanding.

## 2016-10-20 NOTE — Telephone Encounter (Signed)
TC to patient with pathology. Her husband was able to also get on the phone. Discussed stage II disease and recommendations for adjuvant therapy with chemotherapy and radiation. She and her husband agree that she would like to proceed. We will mail her the pathology report and information on the medications for her to review before her 12/20 visit. She is otherwise doing very well and will work on her bowel regimen. PG

## 2016-10-21 ENCOUNTER — Telehealth: Payer: Self-pay | Admitting: *Deleted

## 2016-10-21 NOTE — Telephone Encounter (Signed)
Called pt gave appts for GYN, Chemo edu, lab and MedOnc. Pt re verbalized above information dates and times and confirmed. No further concerns at this time

## 2016-11-11 ENCOUNTER — Ambulatory Visit: Payer: Medicare Other | Attending: Gynecologic Oncology | Admitting: Gynecologic Oncology

## 2016-11-11 ENCOUNTER — Encounter: Payer: Self-pay | Admitting: Gynecologic Oncology

## 2016-11-11 VITALS — BP 145/69 | Temp 98.6°F | Resp 18 | Ht 62.0 in | Wt 143.5 lb

## 2016-11-11 DIAGNOSIS — Z853 Personal history of malignant neoplasm of breast: Secondary | ICD-10-CM | POA: Insufficient documentation

## 2016-11-11 DIAGNOSIS — Z9889 Other specified postprocedural states: Secondary | ICD-10-CM | POA: Insufficient documentation

## 2016-11-11 DIAGNOSIS — C541 Malignant neoplasm of endometrium: Secondary | ICD-10-CM | POA: Insufficient documentation

## 2016-11-11 DIAGNOSIS — Z5189 Encounter for other specified aftercare: Secondary | ICD-10-CM | POA: Insufficient documentation

## 2016-11-11 NOTE — Patient Instructions (Addendum)
Follow up with your other provides as scheduled and given to you today. Return to gyn oncology in 6 months.  We will call you with an appt with Dr. Sondra Come.

## 2016-11-11 NOTE — Progress Notes (Signed)
GYNECOLOGIC ONCOLOGY RETURN PATIENT CONSULTATION  Referring Provider: Dr. [name]   HISTORY OF PRESENT ILLNESS: Tammy Hensley is a 75 y.o. woman who was seen in consultation at the request of Dr. [name] for evaluation of endometrial cancer.   The patient presented to her PCP with postmenopausal bleeding. A pap smear was obtained showing carcinoma. She was seen by Dr. [name] and underwent colposcopy with ECC and EMB. 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 ECC and cervical biopsy were benign.  She has a personal history of stage II breast cancer. She took tamoxifen for 5 years. She reports no bleeding or GYN complaint until ~10/20. She has continued occasional spotting. She denies changes in bowel or bladder habits. No weight loss or anorexia. She does report undergoing genetic testing with her breast cancer 11 years ago which was reportedly negative. She has two family members with prostate cancer (dad and brother) and no family history of colon, breast, uterine or ovarian cancer.   CT 10/24: Reproductive: The endometrial stripe is markedly thickened at 3.5 cmwith marked heterogeneity identified. Large calcified fibroid isnoted. Cystic lesion in the right ovary measures 3.5 by 2.9 by 3.9cm. Left adnexa is unremarkable.  ENCOUNTER DATE: 10/13/2016 Surgery: Total robotic hysterectomy bilateral salpingo-oophorectomy, bilateral pelvic sentinel lymph node removal, left PA sentinel lymph node removal. Mini-laparotomy for specimen removal  Pathology:  1)Uterus, cervix, bilateral tubes and ovaries 2) Right obturator SLN 3) Left Obturator SLN 4) Left PA SLN  Operative findings: 14 week fibroid uterus with one large dominant 6 cm myoma that was palpably calcified. 4 cm right ovarian cyst. + SLN identified in the bilateral obturator spaces, + SLN identified in the left PA space.  Diagnosis 1. Lymph node, sentinel, biopsy, right obturator - THERE IS NO  EVIDENCE OF CARCINOMA IN 8 OF 8 LYMPH NODES (0/8). - SEE COMMENT. 2. Lymph node, sentinel, biopsy, left obturator - THERE IS NO EVIDENCE OF CARCINOMA IN 1 OF 1 LYMPH NODE (0/1). - SEE COMMENT. 3. Lymph node, sentinel, biopsy, left para-aortic - THERE IS NO EVIDENCE OF CARCINOMA IN 4 OF 4 LYMPH NODES (0/4). - SEE COMMENT. 4. Uterus +/- tubes/ovaries, neoplastic, cervix - INVASIVE MIXED SEROUS/ENDOMETRIOID ADENOCARCINOMA, MULTIPLE FOCI, FIGO GRADE III, THE LARGEST FOCUS SPANS 8.2 CM. - ADENOCARCINOMA EXTENDS INTO THE OUTER HALF OF THE MYOMETRIUM AND INVOLVES THE STROMA OF THE LOWER UTERINE SEGMENT AND CERVIX. - LYMPHOVASCULAR INVASION IS IDENTIFIED. - THE SURGICAL RESECTION MARGINS ARE NEGATIVE FOR CARCINOMA. - SEE ONCOLOGY TABLE BELOW. ADDITIONAL FINDINGS: - ENDOMETRIUM: ENDOMETRIOID TYPE POLYP(S), WHICH CONTAIN FOCI OF SIMILAR APPEARING MIXED SEROUS/ENDOMETRIOID ADENOCARCINOMA. - MYOMETRIUM: LEIOMYOMATA. - SEROSA: UNREMARKABLE. - BILATERAL ADNEXA: BENIGN OVARIES AND FALLOPIAN TUBES.  Patient comes in today for her postoperative check for her stage II uterine serous carcinoma. She is currently scheduled to be seen by medical oncology (Dr. Marko Plume) later this week. She is doing very well. No bleeding or abdominal pain, she states that her bowel are sluggish but her husband states that she is not eating very much.   PAST MEDICAL HISTORY: Past Medical History:  Diagnosis Date  . Allergy   . Cancer ([place])    left breast  . Depression   . Diabetes mellitus without complication ([place])   . Hyperlipidemia   . Hypertension   . Hypothyroid   . Insomnia   . Iron deficiency anemia   . Thyroid disease   . Uterine cancer ([place])   . Vitamin D deficiency     PAST SURGICAL  HISTORY: Past Surgical History:  Procedure Laterality Date  . BREAST SURGERY  2006   left breast lumpectomy- radiation, took Femara   . 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 ROBOTIC ASSISTED LAP VAGINAL HYSTERECTOMY N/A 10/13/2016   Procedure: XI ROBOTIC ASSISTED LAPAROSCOPIC VAGINAL HYSTERECTOMY;  Surgeon: Nancy Marus, MD;  Location: [facility];  Service: Gynecology;  Laterality: N/A;  . ROBOTIC ASSISTED SALPINGO OOPHERECTOMY Bilateral 10/13/2016   Procedure: XI ROBOTIC ASSISTED SALPINGO OOPHORECTOMY;  Surgeon: Nancy Marus, MD;  Location: [facility];  Service: Gynecology;  Laterality: Bilateral;  . SENTINEL NODE BIOPSY N/A 10/13/2016   Procedure: SENTINEL NODE BIOPSY;  Surgeon: Nancy Marus, MD;  Location: [facility];  Service: Gynecology;  Laterality: N/A;    OB/GYN HISTORY: OB History  Gravida Para Term Preterm AB Living  4 2 2   2 2   SAB TAB Ectopic Multiple Live Births  2       2    # Outcome Date GA Lbr Len/2nd Weight Sex Delivery Anes PTL Lv  4 SAB           3 SAB           2 Term           1 Term             Obstetric Comments  SVDx2     Age at menopause: ~50 Hx of HRT: denies Hx of STDs: denies Last pap: 2017  History of abnormal pap smears: denies  MEDICATIONS:  Current Outpatient Prescriptions:  .  acetaminophen (TYLENOL) 500 MG tablet, Take 500 mg by mouth daily as needed for moderate pain or headache., Disp: , Rfl:  .  aspirin 81 MG tablet, Take 81 mg by mouth daily.  , Disp: , Rfl:  .  Calcium Carbonate-Vitamin D (CALCIUM 600 + D PO), Take 1 tablet by mouth 2 (two) times daily. , Disp: , Rfl:  .  Cholecalciferol (VITAMIN D) 2000 units CAPS, Take 2,000 Units by mouth every other day., Disp: , Rfl:  .  levothyroxine (SYNTHROID, LEVOTHROID) 88 MCG tablet, Take 1 tablet (88 mcg total) by mouth daily before breakfast. (Patient taking differently: Take 44 mcg by mouth daily before breakfast. Take 82mcg pill M, W ,F and Sat), Disp: 90 tablet, Rfl: 1 .  loratadine-pseudoephedrine (CLARITIN-D 24 HOUR) 10-240 MG 24 hr tablet, Take 1 tablet by mouth daily as needed for allergies., Disp: , Rfl:  .  metFORMIN (GLUCOPHAGE-XR)  500 MG 24 hr tablet, TAKE ONE TABLET BY MOUTH WITH BREAKFAST AND LUNCH AND TAKE TWO TABLETS BY MOUTH WITH SUPPER FOR DAIBETES. (Patient taking differently: take 500mg s 3 times daily), Disp: 360 tablet, Rfl: 1 .  Multiple Vitamins-Minerals (MULTIVITAMIN PO), Take 1 tablet by mouth daily. , Disp: , Rfl:  .  pravastatin (PRAVACHOL) 40 MG tablet, Take 10 mg by mouth every evening. , Disp: , Rfl:  .  traMADol (ULTRAM) 50 MG tablet, Take 1 tablet (50 mg total) by mouth every 6 (six) hours as needed., Disp: 30 tablet, Rfl: 0 .  triamterene-hydrochlorothiazide (MAXZIDE) 75-50 MG tablet, TAKE ONE TABLET BY MOUTH ONCE DAILY FOR BLOOD PRESSURE AND  FLUID (Patient taking differently: Take 0.5 tablets by mouth daily. ), Disp: 90 tablet, Rfl: 3  ALLERGIES: Allergies  Allergen Reactions  . Calan [Verapamil]     Constipation   . Effexor [Venlafaxine]     unknown  .  Erythromycin Nausea And Vomiting  . Femara [Letrozole] Swelling  . Fosamax [Alendronate]     aching  . Ibuprofen     Dizziness, incoherent   . Paxil [Paroxetine Hcl] Nausea Only  . Remeron [Mirtazapine]     Weight gain   . Tamoxifen     Hair loss @ low dose  . Vasotec [Enalapril] Cough    FAMILY HISTORY: Family History  Problem Relation Age of Onset  . Heart disease Mother   . Heart attack Mother   . Cancer Father   . Heart attack Father     SOCIAL HISTORY: Social History   Social History  . Marital status: Married    Spouse name: N/A  . Number of children: N/A  . Years of education: N/A   Occupational History  . Not on file.   Social History Main Topics  . Smoking status: Never Smoker  . Smokeless tobacco: Never Used  . Alcohol use No  . Drug use: No  . Sexual activity: Not on file   Other Topics Concern  . Not on file   Social History Narrative  . No narrative on file    REVIEW OF SYSTEMS: Complete 10-system review is negative except as per HPI.  PHYSICAL EXAM: There were no vitals taken for this  visit. General: Alert, oriented, no acute distress.  Abdomen/GI: Normoactive bowel sounds.  Soft, nondistended, nontender to palpation.  Well healed surgical incisions, no appreciable hernias.  Extremities: Grossly normal range of motion.  Warm, well perfused.  No edema bilaterally.  GU: External genitalia without lesions.  On speculum exam, the vaginal cuff suture line is intact, normal post operative changes, no masses of fluctuance.   ASSESSMENT AND PLAN: Tammy Hensley is a 75 y.o. 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 She is doing well postoperatively. We reviewed her upcoming appointments in medical oncology and radiation oncology as well as the indications for adjuvant therapy. She will return to see [place] in 6 months when she has completed her therapy.

## 2016-11-12 ENCOUNTER — Other Ambulatory Visit (HOSPITAL_BASED_OUTPATIENT_CLINIC_OR_DEPARTMENT_OTHER): Payer: Medicare Other

## 2016-11-12 ENCOUNTER — Other Ambulatory Visit: Payer: Self-pay

## 2016-11-12 ENCOUNTER — Ambulatory Visit
Admission: RE | Admit: 2016-11-12 | Discharge: 2016-11-12 | Disposition: A | Discharge status: Home / Self Care | Payer: Medicare Other | Source: Ambulatory Visit | Attending: Internal Medicine | Admitting: Internal Medicine

## 2016-11-12 ENCOUNTER — Ambulatory Visit (HOSPITAL_BASED_OUTPATIENT_CLINIC_OR_DEPARTMENT_OTHER): Payer: Medicare Other | Admitting: Oncology

## 2016-11-12 ENCOUNTER — Encounter: Payer: Self-pay | Admitting: *Deleted

## 2016-11-12 ENCOUNTER — Other Ambulatory Visit: Payer: Medicare Other

## 2016-11-12 VITALS — BP 148/95 | HR 84 | Temp 97.7°F | Resp 18 | Ht 62.0 in | Wt 145.0 lb

## 2016-11-12 DIAGNOSIS — E039 Hypothyroidism, unspecified: Secondary | ICD-10-CM

## 2016-11-12 DIAGNOSIS — C541 Malignant neoplasm of endometrium: Secondary | ICD-10-CM

## 2016-11-12 DIAGNOSIS — Z853 Personal history of malignant neoplasm of breast: Secondary | ICD-10-CM | POA: Diagnosis not present

## 2016-11-12 DIAGNOSIS — C50412 Malignant neoplasm of upper-outer quadrant of left female breast: Secondary | ICD-10-CM

## 2016-11-12 DIAGNOSIS — Z170 Estrogen receptor positive status [ER+]: Secondary | ICD-10-CM

## 2016-11-12 DIAGNOSIS — I700 Atherosclerosis of aorta: Secondary | ICD-10-CM

## 2016-11-12 DIAGNOSIS — Z23 Encounter for immunization: Secondary | ICD-10-CM

## 2016-11-12 DIAGNOSIS — M87051 Idiopathic aseptic necrosis of right femur: Secondary | ICD-10-CM

## 2016-11-12 DIAGNOSIS — Z1231 Encounter for screening mammogram for malignant neoplasm of breast: Secondary | ICD-10-CM

## 2016-11-12 DIAGNOSIS — M87052 Idiopathic aseptic necrosis of left femur: Secondary | ICD-10-CM

## 2016-11-12 LAB — CBC WITH DIFFERENTIAL/PLATELET
BASO%: 0.4 % (ref 0.0–2.0)
Basophils Absolute: 0 10*3/uL (ref 0.0–0.1)
EOS%: 1.8 % (ref 0.0–7.0)
Eosinophils Absolute: 0.1 10*3/uL (ref 0.0–0.5)
HCT: 38.5 % (ref 34.8–46.6)
HGB: 12.5 g/dL (ref 11.6–15.9)
LYMPH%: 38.2 % (ref 14.0–49.7)
MCH: 25.9 pg (ref 25.1–34.0)
MCHC: 32.5 g/dL (ref 31.5–36.0)
MCV: 79.9 fL (ref 79.5–101.0)
MONO#: 0.3 10*3/uL (ref 0.1–0.9)
MONO%: 6 % (ref 0.0–14.0)
NEUT%: 53.6 % (ref 38.4–76.8)
NEUTROS ABS: 2.7 10*3/uL (ref 1.5–6.5)
Platelets: 333 10*3/uL (ref 145–400)
RBC: 4.82 10*6/uL (ref 3.70–5.45)
RDW: 14.7 % — ABNORMAL HIGH (ref 11.2–14.5)
WBC: 5 10*3/uL (ref 3.9–10.3)
lymph#: 1.9 10*3/uL (ref 0.9–3.3)

## 2016-11-12 LAB — COMPREHENSIVE METABOLIC PANEL
ALT: 11 U/L (ref 0–55)
AST: 18 U/L (ref 5–34)
Albumin: 3.8 g/dL (ref 3.5–5.0)
Alkaline Phosphatase: 105 U/L (ref 40–150)
Anion Gap: 8 mEq/L (ref 3–11)
BILIRUBIN TOTAL: 0.38 mg/dL (ref 0.20–1.20)
BUN: 6.8 mg/dL — ABNORMAL LOW (ref 7.0–26.0)
CO2: 28 meq/L (ref 22–29)
CREATININE: 0.8 mg/dL (ref 0.6–1.1)
Calcium: 9.7 mg/dL (ref 8.4–10.4)
Chloride: 105 mEq/L (ref 98–109)
EGFR: 85 mL/min/{1.73_m2} — ABNORMAL LOW (ref >90)
GLUCOSE: 142 mg/dL — AB (ref 70–140)
Potassium: 4 mEq/L (ref 3.5–5.1)
SODIUM: 142 meq/L (ref 136–145)
TOTAL PROTEIN: 7.2 g/dL (ref 6.4–8.3)

## 2016-11-12 MED ORDER — INFLUENZA VAC SPLIT QUAD 0.5 ML IM SUSY
0.5000 mL | PREFILLED_SYRINGE | Freq: Once | INTRAMUSCULAR | Status: AC
  Administered 2016-11-12: 0.5 mL via INTRAMUSCULAR
  Filled 2016-11-12: qty 0.5

## 2016-11-12 NOTE — Addendum Note (Signed)
Addended by: Joylene John D on: 11/12/2016 09:46 AM   Modules accepted: Orders

## 2016-11-12 NOTE — Progress Notes (Signed)
[place] NEW PATIENT EVALUATION   Name: Tammy Hensley Date: November 12, 2016  MRN: [id number] DOB: [birth year]  REFERRING PHYSICIAN: Nancy Marus cc [name], MD, Cheri Fowler, [name], Eston Esters, Gus Magrinat), Earlie Raveling    REASON FOR REFERRAL: II serous endometrial carcinoma   HISTORY OF PRESENT ILLNESS:Tammy Hensley is a 75 y.o. female who is seen in consultation, together with husband, son and daughter in law  , at the request of Dr Alycia Rossetti, for consideration of adjuvant chemotherapy for II serous endometrial carcinoma. 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  Patient had been in her usual good health until postmenopausal bleeding in 08-2016. She had PAP 09-07-16 by PCP Dr [name], which documented carcinoma. She had evaluation by Dr [name] with colposcopy/ ECC/endometrial biopsy demonstrated serous endometrioid endometrial carcinoma. CT AP 09-15-16 showed endometrial stripe 3.5 cm with marked heterogeneity, cystic lesion in right ovary 3.5 x 2.9 cm. She had robotic assisted total hysterectomy, BSO, bilateral pelvic sentinel nodes and left PA sentinal node, with mini-laparotomy for specimen removal. 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 Patient's post operative course was not remarkable, discharged home on POD 1. She saw Dr Alycia Rossetti in follow up on 11-11-16, recommendation for adjuvant chemotherapy and radiation. Consultation with Dr Sondra Come is scheduled for 12-17-16.    REVIEW OF SYSTEMS : Post operative pain resolved. No fever or symptoms of infection. Constipation after  surgery, improved with miralax. Appetite better, usual weight 147 - 150 lbs. No bladder symptoms. No vaginal bleeding, no LE swelling. Did not use lovenox after hospital DC.  No noted changes in breasts bilaterally.  No HA. Reading glasses. Some environmental allergies, not severe. No difficulty hearing. No dental problems. No respiratory symptoms. No cardiac symptoms. No arthritis. No GERD. No prior bowel problems. No history of blood clots. No peripheral neuropathy.   Remainder of full 10 point review of systems negative.   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 treated with lumpectomy and sentinel node evaluation 09-14-2005. She had radiation by Dr Sondra Come completed 3-8- 2007, with tamoxifen x 5 years thru 03-25-2011. (did not tolerate Femara with swelling and aches). Followed by Dr Eston Esters then by Dr Tressa Busman. Last visit by Dr Jana Hakim 01-24-15. Genetics testing reportedly negative Mammograms done 11-12-16, report pending at time of visit.  Dense breast tissue by previous mammograms  Colonoscopy 03-10-16  1 polyp, repeat recommended in 5 years ([facility])  Hypothyroid on synthroid x years, low dose MWFSat  Bone density 2011, lowest T score -1.9  CURRENT MEDICATIONS: reviewed as listed now in EMR Flu vaccine given 11-12-16 Zofran 8 mg every 8 hrs prn Ativan 0.5 mg SL or po every 6 hrs prn Decadron 20 mg with food 12 hrs and 6 hrs prior to taxol   SOCIAL HISTORY:   Originally from [place]. Married, lives with husband in [place]. Retired Dipl[employer] Services operational officer. 1  son with wife and 2 grands (8 and 25) in [place], 1 daughter in [place]. Husband also retired. Never  smoker, no ETOH   FAMILY HISTORY:  Father prostate cancer at 41, lived to 8 Mother MI Brother prostate cancer Paternal uncle prostate cancer at age 15, lived to 62 4 sisters 5 brothers Son and daughter healthy        PHYSICAL EXAM:  height is _0  (1.575 m) and weight is 145 lb (65.8 kg). Her oral temperature is 97.7 F (36.5 C). Her blood pressure is 148/95 (abnormal) and her pulse is 84. Her respiration is 18 and oxygen saturation is 100%.  Alert, pleasant, cooperative lady, looks younger than stated age, easily ambulatory, very good historian. Family very supportive.  HEENT: normal hair pattern. PERRL, not icteric. Oral mucosa and posterior pharynx clear. Neck supple without JVD or thyroid mass.   RESPIRATORY:lungs clear to A and P  CARDIAC/ VASCULAR: heart RRR, clear heart sounds, no gallop. Peripheral pulses symmetrical, intact. Peripheral venous access RUE appears adequate to begin chemo  ABDOMEN: soft, not tender, not distended. Laparoscopic incisions closed, lower midline incision closed, not tender, healing well. Bowel sounds present and normal. No HSM or mass  LYMPH NODES: no cervical, supraclavicular, axillary or inguinal adenopathy  BREASTS: left lumpectomy scar well healed, no evidence of local recurrence at scar, ,radiation tattoos noted. Otherwise bilateral breasts without dominant mass, skin or nipple findings of concern and axillae benign  NEUROLOGIC: CN, motor, sensory, cerebellar grossly nonfocal. PSYCH appropriate mood and affect  SKIN:without rash, ecchymosis, petechiae  MUSCULOSKELETAL: no lymphedema obvious LUE. No edema, cords, ,tenderness LE. Back not tender. Muscle mass symmetrical and good.    LABORATORY DATA:  Results for orders placed or performed in visit on 11/12/16 (from the past 48 hour(s))  CBC with Differential     Status: Abnormal   Collection Time: 11/12/16  3:02 PM  Result Value Ref Range   WBC 5.0 3.9 - 10.3 10e3/uL   NEUT# 2.7  1.5 - 6.5 10e3/uL   HGB 12.5 11.6 - 15.9 g/dL   HCT 38.5 34.8 - 46.6 %   Platelets 333 145 - 400 10e3/uL   MCV 79.9 79.5 - 101.0 fL   MCH 25.9 25.1 - 34.0 pg   MCHC 32.5 31.5 - 36.0 g/dL   RBC 4.82 3.70 - 5.45 10e6/uL   RDW 14.7 (H) 11.2 - 14.5 %   lymph# 1.9 0.9 - 3.3 10e3/uL   MONO# 0.3 0.1 - 0.9 10e3/uL   Eosinophils Absolute 0.1 0.0 - 0.5 10e3/uL   Basophils Absolute 0.0 0.0 - 0.1 10e3/uL   NEUT% 53.6 38.4 - 76.8 %   LYMPH% 38.2 14.0 - 49.7 %   MONO% 6.0 0.0 - 14.0 %   EOS% 1.8 0.0 - 7.0 %   BASO% 0.4 0.0 - 2.0 %  Comprehensive metabolic panel     Status: Abnormal   Collection Time: 11/12/16  3:02 PM  Result Value Ref Range   Sodium 142 136 - 145 mEq/L   Potassium 4.0 3.5 - 5.1 mEq/L   Chloride 105 98 - 109 mEq/L   CO2 28 22 - 29 mEq/L   Glucose 142 (H) 70 - 140 mg/dl    Comment: Glucose reference range is for nonfasting patients. Fasting glucose reference range is 70- 100.   BUN 6.8 (L) 7.0 - 26.0 mg/dL   Creatinine 0.8 0.6 - 1.1 mg/dL   Total Bilirubin 0.38 0.20 - 1.20 mg/dL   Alkaline Phosphatase 105 40 -  150 U/L   AST 18 5 - 34 U/L   ALT 11 0 - 55 U/L   Total Protein 7.2 6.4 - 8.3 g/dL   Albumin 3.8 3.5 - 5.0 g/dL   Calcium 9.7 8.4 - 10.4 mg/dL   Anion Gap 8 3 - 11 mEq/L   EGFR 85 (L) >90 ml/min/1.73 m2    Comment: eGFR is calculated using the CKD-EPI Creatinine Equation (2009)      PATHOLOGY: SUPPLEMENTAL for ROLLANDE, THURSBY ([id number]) Patient: JAELANI, POSA Collected: 10/13/2016 Client: [facility] Accession: [id number] Received: 10/13/2016 Charlaine Dalton OF PATHOLOGY FINAL DIAGNOSIS Diagnosis 1. Lymph node, sentinel, biopsy, right obturator - THERE IS NO EVIDENCE OF CARCINOMA IN 8 OF 8 LYMPH NODES (0/8). - SEE COMMENT. 2. Lymph node, sentinel, biopsy, left obturator - THERE IS NO EVIDENCE OF CARCINOMA IN 1 OF 1 LYMPH NODE (0/1). - SEE COMMENT. 3. Lymph node, sentinel, biopsy, left para-aortic - THERE IS NO EVIDENCE OF CARCINOMA IN  4 OF 4 LYMPH NODES (0/4). - SEE COMMENT. 4. Uterus +/- tubes/ovaries, neoplastic, cervix - INVASIVE MIXED SEROUS/ENDOMETRIOID ADENOCARCINOMA, MULTIPLE FOCI, FIGO GRADE III, THE LARGEST FOCUS SPANS 8.2 CM. - ADENOCARCINOMA EXTENDS INTO THE OUTER HALF OF THE MYOMETRIUM AND INVOLVES THE STROMA OF THE LOWER UTERINE SEGMENT AND CERVIX. - LYMPHOVASCULAR INVASION IS IDENTIFIED. - THE SURGICAL RESECTION MARGINS ARE NEGATIVE FOR CARCINOMA. - SEE ONCOLOGY TABLE BELOW. ADDITIONAL FINDINGS: - ENDOMETRIUM: ENDOMETRIOID TYPE POLYP(S), WHICH CONTAIN FOCI OF SIMILAR APPEARING MIXED SEROUS/ENDOMETRIOID ADENOCARCINOMA. - MYOMETRIUM: LEIOMYOMATA. - SEROSA: UNREMARKABLE. - BILATERAL ADNEXA: BENIGN OVARIES AND FALLOPIAN TUBES. Microscopic Comment 1. - 3. Immunohistochemical stains performed on multiple blocks in parts 1 - 3 fail to highlight the presence of cytokeratin positive tumor cells. 4. ONCOLOGY TABLE-UTERUS, CARCINOMA OR CARCINOSARCOMA Specimen: Uterus, cervix, and bilateral adnexa. Microscopic Comment Procedure: Salpingo-oophorectomy. Lymph node sampling performed: Yes Specimen integrity: Intact Maximum tumor size: The largest spans 8.2 cm (gross measurement). Histologic type: Mixed serous (80%) and endometrioid (20%) adenocarcinoma. Grade: III Myometrial invasion: 2.4 cm where myometrium is 3.5 cm in thickness Cervical stromal involvement: Present Extent of involvement of other organs: Not identified Lymph - vascular invasion: Present Peritoneal washings: Not performed Lymph nodes: Examined: 13 Sentinel 0 Non-sentinel 13 Total Lymph nodes with metastasis: 0 TNM code: pT2, pN0 FIGO Stage (based on pathologic findings, needs clinical correlation): II Comment: Grossly, there is an 8.2 cm mixed serous/endometrioid adenocarcinoma involving much of the endometrium with extension into the outer half of the myometrium as well as extension into the lower uterine segment stroma and cervical  stroma. In addition, there are multiple separate endometrial polyps, which on histologic examination also contain similar appearing adenocarcinoma. MMR testing by [facility] will be performed and the results reported separately.   PAP CYTOLOGY 09-07-16  Dx:  Post-menopausal bleeding  Notes Recorded by Starlyn Skeans, PA-C on 09/13/2016 at 8:32 PM EDT Pap smear is positive. Patient is aware.   62moago  Adequacy  (A)  Satisfactory for evaluation. The presence or absence of an endocervical / transformation zone component cannot be determined because of atrophy.      Diagnosis MALIGNANT CELLS PRESENT, CONSISTENT WITH CARCINOMA.             RADIOGRAPHY: EXAM: CT ABDOMEN AND PELVIS WITH CONTRAST  TECHNIQUE: Multidetector CT imaging of the abdomen and pelvis was performed using the standard protocol following bolus administration of intravenous contrast.  CONTRAST:  100 ml ISOVUE-300 IOPAMIDOL (ISOVUE-300) INJECTION 61%  COMPARISON:  None.  FINDINGS: Lower chest:  Mild dependent atelectasis is seen in the lung bases. No pleural or pericardial effusion. Postoperative change is noted in the left breast where 3-4 small surgical clips are identified.  Hepatobiliary: Unremarkable.  Pancreas: Unremarkable.  Spleen: Unremarkable.  Adrenals/Urinary Tract: Cyst in the upper pole of the left kidney measures 2.0 cm in diameter. Cyst in lower pole of the right kidney measures 0.8 cm in diameter. Otherwise unremarkable in appearance.  Stomach/Bowel: Unremarkable.  Vascular/Lymphatic: Aortoiliac atherosclerosis without aneurysm is identified.  Reproductive: The endometrial stripe is markedly thickened at 3.5 cm with marked heterogeneity identified. Large calcified fibroid is noted. Cystic lesion in the right ovary measures 3.5 by 2.9 by 3.9 cm. Left adnexa is unremarkable.  Other: No lymphadenopathy or fluid.  Musculoskeletal: There is avascular necrosis of the  femoral heads, worse on the right. Mild degenerative disease is noted about the hips.  IMPRESSION: Markedly thickened and heterogeneous endometrial stripe suspicious for endometrial carcinoma in this patient with postmenopausal vaginal bleeding.  Cystic lesion in the right ovary cannot be definitively characterized. Pelvic ultrasound is recommended for further evaluation. This recommendation follows ACR consensus guidelines: White Paper of the ACR Incidental Findings Committee II on Adnexal Findings. J Am Coll Radiol [phone].  Aortoiliac atherosclerosis.  Avascular necrosis of the femoral heads bilaterally.    DISCUSSION: All of history above reviewed in detail with paitient and family, particularly circumstances surrounding diagnosis of the endometrial cancer, workup, surgical intervention and pathology information. We have discussed rationale for adjuvant chemotherapy and radiation therapy; consultation with Dr Roselind Messier planned 12-17-16. Patient and husband are comfortable with information given in chemotherapy education class earlier today. We have discussed outpatient administration of carboplatin and taxol, q 3 week vs weekly regimens, possible side effects including N/V, hair loss, constipation, allergic reactions to taxol infusion, peripheral neuropathy, taxol aches, cytopenias. She is in agreement with q 3 week dosing, prefers treatments on Tuesdays so will begin 12-01-16 as next Tuesday available. Peripheral IV access should be done in RUE due to previous sentinel nodes and radiation on left. Discussed contacting this office if needed, and lab/ provider follow up between treatments.  We discussed previous tamoxifen use, which she is aware may be associated with endometrial cancer.   Verbal consent given for chemotherapy.   IMPRESSION / PLAN:  1. 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 Will begin adjuvant  carboplatin taxol every 3 weeks starting 12-01-16 (timing due to holidays and patient request). I will see her with labs ~ 1 week and ~ 2 weeks after first treatment.  She will have consultation with Dr Roselind Messier 12-17-16. 2.Stage 2 infiltrating obular carcinoma of left breast, ER PR positive HER-2 negative at lumpectomy and sentinel node evaluation 08-2005, with radiation by Dr Roselind Messier and 5 years tamoxifen thru 03-2011 (intolerant to Femara). Not known recurrent. [facility]12[facility], report pending today. Heterogeneously dense breast tissue by mammograms 2016. 3.peripheral IV access best limited to RUE due to left axillary sentinel nodes and radiation with left breast cancer remotely. 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 They can contact this office for questions or concerns at any time prior to next scheduled visit. Chemo orders placed using Via  pathways. Message to [facility] managed care for preauth carbo taxol granix. Message to collaborative RN for antiemetics and to review decadron premeds and antiemetics by phone shortly before first treatment. Time spent  75 min, including >50% discussion and coordination of care. Cc Drs Alycia Rossetti, Donnel Saxon, [place].    Evlyn Clines, MD 11/12/2016 6:33 PM

## 2016-11-13 ENCOUNTER — Encounter: Payer: Self-pay | Admitting: Radiation Oncology

## 2016-11-17 ENCOUNTER — Telehealth: Payer: Self-pay

## 2016-11-17 ENCOUNTER — Encounter: Payer: Self-pay | Admitting: Oncology

## 2016-11-17 DIAGNOSIS — I700 Atherosclerosis of aorta: Secondary | ICD-10-CM | POA: Insufficient documentation

## 2016-11-17 DIAGNOSIS — M87052 Idiopathic aseptic necrosis of left femur: Secondary | ICD-10-CM

## 2016-11-17 DIAGNOSIS — C541 Malignant neoplasm of endometrium: Secondary | ICD-10-CM | POA: Insufficient documentation

## 2016-11-17 DIAGNOSIS — M87051 Idiopathic aseptic necrosis of right femur: Secondary | ICD-10-CM

## 2016-11-17 HISTORY — DX: Idiopathic aseptic necrosis of left femur: M87.051

## 2016-11-17 MED ORDER — ONDANSETRON HCL 8 MG PO TABS: 8.0000 mg | ORAL_TABLET | Freq: Three times a day (TID) | ORAL | 1 refills | Status: DC | PRN

## 2016-11-17 MED ORDER — LORAZEPAM 0.5 MG PO TABS: 0.5000 mg | ORAL_TABLET | Freq: Four times a day (QID) | ORAL | 0 refills | Status: DC | PRN

## 2016-11-17 MED ORDER — DEXAMETHASONE 4 MG PO TABS: ORAL_TABLET | ORAL | 0 refills | Status: DC

## 2016-11-17 NOTE — Telephone Encounter (Signed)
S/w pt re premeds now e-scribing. Also mentioned miralax and claritin. Pt wrote these down.

## 2016-11-17 NOTE — Telephone Encounter (Signed)
-----   Message from Gordy Levan, MD sent at 11/17/2016 12:42 PM EST ----- Regarding: scripts; RN call before first chemo 1-9 Scripts to pharmacy, generics always fine  Decadron 4 mg  Five tablets with food 12 hrs and 6 hrs prior to taxol #10 for first treatment, then can refill for #20  zofran 8 mg   One every 8 hrs prn nausea. Will not make drowsy  #30  1 RF  Ativan 0.5 mg   1 SL or po every 6 hrs prn nausea. Will make drowsy #20 NRF  RN please call her a couple of days prior to #1 carbo taxol on 1-9, to review times and instructions for decadron and go over antiemetics. She may need miralax after chemo. Recommend claritin starting day of chemo and next few days for taxol aches  thanks

## 2016-11-17 NOTE — Progress Notes (Signed)
START ON PATHWAY REGIMEN - Uterine  UTOS50: Carboplatin + Paclitaxel (6/175) q21 Days x 6 Cycles   A cycle is every 21 days:     Paclitaxel (Taxol(R)) 175 mg/m2 in 500 mL NS IV over 3 hours day 1 Dose Mod: None     Carboplatin (Paraplatin(R)) AUC=6 in 250 mL NS IV over 1 hour day 1 Dose Mod: None Additional Orders: * All AUC calculations intended to be used in [employer] formula  **Always confirm dose/schedule in your pharmacy ordering system**    Patient Characteristics: Papillary Serous and Clear Cell Histology, First Line, Medically Operable AJCC T Stage: X AJCC N Stage: X Stage Grouping: II AJCC M Stage: X Would you be surprised if this patient died  in the next year? I would be surprised if this patient died in the next year Line of therapy: First Line Patient Status: Medically Operable  Intent of Therapy: Curative Intent, Discussed with Patient

## 2016-11-23 DIAGNOSIS — C541 Malignant neoplasm of endometrium: Secondary | ICD-10-CM

## 2016-11-23 HISTORY — DX: Malignant neoplasm of endometrium: C54.1

## 2016-11-30 ENCOUNTER — Other Ambulatory Visit (HOSPITAL_BASED_OUTPATIENT_CLINIC_OR_DEPARTMENT_OTHER): Payer: Medicare Other

## 2016-11-30 ENCOUNTER — Telehealth: Payer: Self-pay | Admitting: *Deleted

## 2016-11-30 DIAGNOSIS — C541 Malignant neoplasm of endometrium: Secondary | ICD-10-CM | POA: Diagnosis not present

## 2016-11-30 LAB — CBC WITH DIFFERENTIAL/PLATELET
BASO%: 0.4 % (ref 0.0–2.0)
Basophils Absolute: 0 10*3/uL (ref 0.0–0.1)
EOS ABS: 0 10*3/uL (ref 0.0–0.5)
EOS%: 0.8 % (ref 0.0–7.0)
HCT: 39.1 % (ref 34.8–46.6)
HEMOGLOBIN: 12.9 g/dL (ref 11.6–15.9)
LYMPH#: 1.9 10*3/uL (ref 0.9–3.3)
LYMPH%: 38.5 % (ref 14.0–49.7)
MCH: 26.1 pg (ref 25.1–34.0)
MCHC: 33 g/dL (ref 31.5–36.0)
MCV: 79.1 fL — AB (ref 79.5–101.0)
MONO#: 0.6 10*3/uL (ref 0.1–0.9)
MONO%: 11.3 % (ref 0.0–14.0)
NEUT%: 49 % (ref 38.4–76.8)
NEUTROS ABS: 2.4 10*3/uL (ref 1.5–6.5)
Platelets: 311 10*3/uL (ref 145–400)
RBC: 4.94 10*6/uL (ref 3.70–5.45)
RDW: 14.4 % (ref 11.2–14.5)
WBC: 4.9 10*3/uL (ref 3.9–10.3)

## 2016-11-30 LAB — COMPREHENSIVE METABOLIC PANEL
ALBUMIN: 4 g/dL (ref 3.5–5.0)
ALK PHOS: 97 U/L (ref 40–150)
ALT: 11 U/L (ref 0–55)
AST: 17 U/L (ref 5–34)
Anion Gap: 8 mEq/L (ref 3–11)
BILIRUBIN TOTAL: 0.52 mg/dL (ref 0.20–1.20)
BUN: 8.1 mg/dL (ref 7.0–26.0)
CO2: 28 mEq/L (ref 22–29)
Calcium: 10.1 mg/dL (ref 8.4–10.4)
Chloride: 106 mEq/L (ref 98–109)
Creatinine: 0.7 mg/dL (ref 0.6–1.1)
GLUCOSE: 60 mg/dL — AB (ref 70–140)
Potassium: 3.8 mEq/L (ref 3.5–5.1)
SODIUM: 142 meq/L (ref 136–145)
Total Protein: 7 g/dL (ref 6.4–8.3)

## 2016-11-30 NOTE — Telephone Encounter (Signed)
Returned call to pt to review Decadron pre-med, antiemetic and Claritin instructions. Instructed her to take plain Claritin 10 mg daily for 7 days. Start 1/8 or 1/9 prior to chemo. Teach back done for the above. Encouraged pt to call office as needed for side effect management.

## 2016-12-01 ENCOUNTER — Ambulatory Visit (HOSPITAL_BASED_OUTPATIENT_CLINIC_OR_DEPARTMENT_OTHER): Payer: Medicare Other

## 2016-12-01 ENCOUNTER — Encounter: Payer: Self-pay | Admitting: Oncology

## 2016-12-01 VITALS — BP 158/84 | HR 92 | Temp 97.7°F | Resp 18

## 2016-12-01 DIAGNOSIS — Z5111 Encounter for antineoplastic chemotherapy: Secondary | ICD-10-CM | POA: Diagnosis not present

## 2016-12-01 DIAGNOSIS — C541 Malignant neoplasm of endometrium: Secondary | ICD-10-CM

## 2016-12-01 MED ORDER — ONDANSETRON HCL 4 MG/2ML IJ SOLN
INTRAMUSCULAR | Status: AC
  Filled 2016-12-01: qty 4

## 2016-12-01 MED ORDER — SODIUM CHLORIDE 0.9 % IV SOLN
453.0000 mg | Freq: Once | INTRAVENOUS | Status: AC
  Administered 2016-12-01: 450 mg via INTRAVENOUS
  Filled 2016-12-01: qty 45

## 2016-12-01 MED ORDER — FAMOTIDINE IN NACL 20-0.9 MG/50ML-% IV SOLN
20.0000 mg | Freq: Once | INTRAVENOUS | Status: AC
  Administered 2016-12-01: 20 mg via INTRAVENOUS

## 2016-12-01 MED ORDER — SODIUM CHLORIDE 0.9 % IV SOLN
Freq: Once | INTRAVENOUS | Status: AC
  Administered 2016-12-01: 09:00:00 via INTRAVENOUS

## 2016-12-01 MED ORDER — PACLITAXEL CHEMO INJECTION 300 MG/50ML
175.0000 mg/m2 | Freq: Once | INTRAVENOUS | Status: AC
  Administered 2016-12-01: 300 mg via INTRAVENOUS
  Filled 2016-12-01: qty 50

## 2016-12-01 MED ORDER — PACLITAXEL CHEMO INJECTION 300 MG/50ML: 175.0000 mg/m2 | Freq: Once | INTRAVENOUS | Status: DC

## 2016-12-01 MED ORDER — DIPHENHYDRAMINE HCL 50 MG/ML IJ SOLN
25.0000 mg | Freq: Once | INTRAMUSCULAR | Status: AC
  Administered 2016-12-01: 25 mg via INTRAVENOUS

## 2016-12-01 MED ORDER — FAMOTIDINE IN NACL 20-0.9 MG/50ML-% IV SOLN
INTRAVENOUS | Status: AC
  Filled 2016-12-01: qty 50

## 2016-12-01 MED ORDER — DIPHENHYDRAMINE HCL 50 MG/ML IJ SOLN
INTRAMUSCULAR | Status: AC
Start: 2016-12-01 — End: 2016-12-01
  Filled 2016-12-01: qty 1

## 2016-12-01 MED ORDER — ONDANSETRON HCL 4 MG/2ML IJ SOLN
8.0000 mg | Freq: Once | INTRAMUSCULAR | Status: AC
  Administered 2016-12-01: 8 mg via INTRAVENOUS

## 2016-12-01 MED ORDER — SODIUM CHLORIDE 0.9 % IV SOLN
Freq: Once | INTRAVENOUS | Status: AC
  Administered 2016-12-01: 09:00:00 via INTRAVENOUS
  Filled 2016-12-01: qty 5

## 2016-12-01 NOTE — Patient Instructions (Addendum)
[facility] Discharge Instructions for Patients Receiving Chemotherapy  Today you received the following chemotherapy agents Taxol and Carboplatin  To help prevent nausea and vomiting after your treatment, we encourage you to take your nausea medication as directed. If you develop nausea and vomiting that is not controlled by your nausea medication, call the clinic.   BELOW ARE SYMPTOMS THAT SHOULD BE REPORTED IMMEDIATELY:  *FEVER GREATER THAN 100.5 F  *CHILLS WITH OR WITHOUT FEVER  NAUSEA AND VOMITING THAT IS NOT CONTROLLED WITH YOUR NAUSEA MEDICATION  *UNUSUAL SHORTNESS OF BREATH  *UNUSUAL BRUISING OR BLEEDING  TENDERNESS IN MOUTH AND THROAT WITH OR WITHOUT PRESENCE OF ULCERS  *URINARY PROBLEMS  *BOWEL PROBLEMS  UNUSUAL RASH Items with * indicate a potential emergency and should be followed up as soon as possible.  Feel free to call the clinic you have any questions or concerns. The clinic phone number is [phone].  Please show the [facility] at check-in to the Emergency Department and triage nurse.   Paclitaxel injection (Taxol) What is this medicine? PACLITAXEL (PAK li TAX el) is a chemotherapy drug. It targets fast dividing cells, like cancer cells, and causes these cells to die. This medicine is used to treat ovarian cancer, breast cancer, and other cancers. This medicine may be used for other purposes; ask your health care provider or pharmacist if you have questions. COMMON BRAND NAME(S): Onxol, Taxol What should I tell my health care provider before I take this medicine? They need to know if you have any of these conditions: -blood disorders -irregular heartbeat -infection (especially a virus infection such as chickenpox, cold sores, or herpes) -liver disease -previous or ongoing radiation therapy -an unusual or allergic reaction to paclitaxel, alcohol, polyoxyethylated castor oil, other chemotherapy agents, other medicines, foods,  dyes, or preservatives -pregnant or trying to get pregnant -breast-feeding How should I use this medicine? This drug is given as an infusion into a vein. It is administered in a hospital or clinic by a specially trained health care professional. Talk to your pediatrician regarding the use of this medicine in children. Special care may be needed. Overdosage: If you think you have taken too much of this medicine contact a poison control center or emergency room at once. NOTE: This medicine is only for you. Do not share this medicine with others. What if I miss a dose? It is important not to miss your dose. Call your doctor or health care professional if you are unable to keep an appointment. What may interact with this medicine? Do not take this medicine with any of the following medications: -disulfiram -metronidazole This medicine may also interact with the following medications: -cyclosporine -diazepam -ketoconazole -medicines to increase blood counts like filgrastim, pegfilgrastim, sargramostim -other chemotherapy drugs like cisplatin, doxorubicin, epirubicin, etoposide, teniposide, vincristine -quinidine -testosterone -vaccines -verapamil Talk to your doctor or health care professional before taking any of these medicines: -acetaminophen -aspirin -ibuprofen -ketoprofen -naproxen This list may not describe all possible interactions. Give your health care provider a list of all the medicines, herbs, non-prescription drugs, or dietary supplements you use. Also tell them if you smoke, drink alcohol, or use illegal drugs. Some items may interact with your medicine. What should I watch for while using this medicine? Your condition will be monitored carefully while you are receiving this medicine. You will need important blood work done while you are taking this medicine. This medicine can cause serious allergic reactions. To reduce your risk you will need to  take other medicine(s)  before treatment with this medicine. If you experience allergic reactions like skin rash, itching or hives, swelling of the face, lips, or tongue, tell your doctor or health care professional right away. In some cases, you may be given additional medicines to help with side effects. Follow all directions for their use. This drug may make you feel generally unwell. This is not uncommon, as chemotherapy can affect healthy cells as well as cancer cells. Report any side effects. Continue your course of treatment even though you feel ill unless your doctor tells you to stop. Call your doctor or health care professional for advice if you get a fever, chills or sore throat, or other symptoms of a cold or flu. Do not treat yourself. This drug decreases your body's ability to fight infections. Try to avoid being around people who are sick. This medicine may increase your risk to bruise or bleed. Call your doctor or health care professional if you notice any unusual bleeding. Be careful brushing and flossing your teeth or using a toothpick because you may get an infection or bleed more easily. If you have any dental work done, tell your dentist you are receiving this medicine. Avoid taking products that contain aspirin, acetaminophen, ibuprofen, naproxen, or ketoprofen unless instructed by your doctor. These medicines may hide a fever. Do not become pregnant while taking this medicine. Women should inform their doctor if they wish to become pregnant or think they might be pregnant. There is a potential for serious side effects to an unborn child. Talk to your health care professional or pharmacist for more information. Do not breast-feed an infant while taking this medicine. Men are advised not to father a child while receiving this medicine. This product may contain alcohol. Ask your pharmacist or healthcare provider if this medicine contains alcohol. Be sure to tell all healthcare providers you are taking this  medicine. Certain medicines, like metronidazole and disulfiram, can cause an unpleasant reaction when taken with alcohol. The reaction includes flushing, headache, nausea, vomiting, sweating, and increased thirst. The reaction can last from 30 minutes to several hours. What side effects may I notice from receiving this medicine? Side effects that you should report to your doctor or health care professional as soon as possible: -allergic reactions like skin rash, itching or hives, swelling of the face, lips, or tongue -low blood counts - This drug may decrease the number of white blood cells, red blood cells and platelets. You may be at increased risk for infections and bleeding. -signs of infection - fever or chills, cough, sore throat, pain or difficulty passing urine -signs of decreased platelets or bleeding - bruising, pinpoint red spots on the skin, black, tarry stools, nosebleeds -signs of decreased red blood cells - unusually weak or tired, fainting spells, lightheadedness -breathing problems -chest pain -high or low blood pressure -mouth sores -nausea and vomiting -pain, swelling, redness or irritation at the injection site -pain, tingling, numbness in the hands or feet -slow or irregular heartbeat -swelling of the ankle, feet, hands Side effects that usually do not require medical attention (report to your doctor or health care professional if they continue or are bothersome): -bone pain -complete hair loss including hair on your head, underarms, pubic hair, eyebrows, and eyelashes -changes in the color of fingernails -diarrhea -loosening of the fingernails -loss of appetite -muscle or joint pain -red flush to skin -sweating This list may not describe all possible side effects. Call your doctor for medical advice about  side effects. You may report side effects to FDA at 1-800-FDA-1088. Where should I keep my medicine? This drug is given in a hospital or clinic and will not be  stored at home. NOTE: This sheet is a summary. It may not cover all possible information. If you have questions about this medicine, talk to your doctor, pharmacist, or health care provider.  2017 Elsevier/Gold Standard (2015-09-10 19:58:00)   Carboplatin injection What is this medicine? CARBOPLATIN (KAR boe pla tin) is a chemotherapy drug. It targets fast dividing cells, like cancer cells, and causes these cells to die. This medicine is used to treat ovarian cancer and many other cancers. This medicine may be used for other purposes; ask your health care provider or pharmacist if you have questions. COMMON BRAND NAME(S): Paraplatin What should I tell my health care provider before I take this medicine? They need to know if you have any of these conditions: -blood disorders -hearing problems -kidney disease -recent or ongoing radiation therapy -an unusual or allergic reaction to carboplatin, cisplatin, other chemotherapy, other medicines, foods, dyes, or preservatives -pregnant or trying to get pregnant -breast-feeding How should I use this medicine? This drug is usually given as an infusion into a vein. It is administered in a hospital or clinic by a specially trained health care professional. Talk to your pediatrician regarding the use of this medicine in children. Special care may be needed. Overdosage: If you think you have taken too much of this medicine contact a poison control center or emergency room at once. NOTE: This medicine is only for you. Do not share this medicine with others. What if I miss a dose? It is important not to miss a dose. Call your doctor or health care professional if you are unable to keep an appointment. What may interact with this medicine? -medicines for seizures -medicines to increase blood counts like filgrastim, pegfilgrastim, sargramostim -some antibiotics like amikacin, gentamicin, neomycin, streptomycin, tobramycin -vaccines Talk to your doctor  or health care professional before taking any of these medicines: -acetaminophen -aspirin -ibuprofen -ketoprofen -naproxen This list may not describe all possible interactions. Give your health care provider a list of all the medicines, herbs, non-prescription drugs, or dietary supplements you use. Also tell them if you smoke, drink alcohol, or use illegal drugs. Some items may interact with your medicine. What should I watch for while using this medicine? Your condition will be monitored carefully while you are receiving this medicine. You will need important blood work done while you are taking this medicine. This drug may make you feel generally unwell. This is not uncommon, as chemotherapy can affect healthy cells as well as cancer cells. Report any side effects. Continue your course of treatment even though you feel ill unless your doctor tells you to stop. In some cases, you may be given additional medicines to help with side effects. Follow all directions for their use. Call your doctor or health care professional for advice if you get a fever, chills or sore throat, or other symptoms of a cold or flu. Do not treat yourself. This drug decreases your body's ability to fight infections. Try to avoid being around people who are sick. This medicine may increase your risk to bruise or bleed. Call your doctor or health care professional if you notice any unusual bleeding. Be careful brushing and flossing your teeth or using a toothpick because you may get an infection or bleed more easily. If you have any dental work done, tell your dentist  you are receiving this medicine. Avoid taking products that contain aspirin, acetaminophen, ibuprofen, naproxen, or ketoprofen unless instructed by your doctor. These medicines may hide a fever. Do not become pregnant while taking this medicine. Women should inform their doctor if they wish to become pregnant or think they might be pregnant. There is a potential  for serious side effects to an unborn child. Talk to your health care professional or pharmacist for more information. Do not breast-feed an infant while taking this medicine. What side effects may I notice from receiving this medicine? Side effects that you should report to your doctor or health care professional as soon as possible: -allergic reactions like skin rash, itching or hives, swelling of the face, lips, or tongue -signs of infection - fever or chills, cough, sore throat, pain or difficulty passing urine -signs of decreased platelets or bleeding - bruising, pinpoint red spots on the skin, black, tarry stools, nosebleeds -signs of decreased red blood cells - unusually weak or tired, fainting spells, lightheadedness -breathing problems -changes in hearing -changes in vision -chest pain -high blood pressure -low blood counts - This drug may decrease the number of white blood cells, red blood cells and platelets. You may be at increased risk for infections and bleeding. -nausea and vomiting -pain, swelling, redness or irritation at the injection site -pain, tingling, numbness in the hands or feet -problems with balance, talking, walking -trouble passing urine or change in the amount of urine Side effects that usually do not require medical attention (report to your doctor or health care professional if they continue or are bothersome): -hair loss -loss of appetite -metallic taste in the mouth or changes in taste This list may not describe all possible side effects. Call your doctor for medical advice about side effects. You may report side effects to FDA at 1-800-FDA-1088. Where should I keep my medicine? This drug is given in a hospital or clinic and will not be stored at home. NOTE: This sheet is a summary. It may not cover all possible information. If you have questions about this medicine, talk to your doctor, pharmacist, or health care provider.  2017 Elsevier/Gold Standard  (2008-02-14 14:38:05)

## 2016-12-01 NOTE — Progress Notes (Signed)
Introduced myself as her FA.  Unfortunately there aren't any foundations offering copay assistance for her Dx so I offered the [facility] and [place], went over what they cover and gave her an expense sheet.  She would like to discuss it with her husband and let me know if she would like to apply.  She has my card to contact me.

## 2016-12-06 ENCOUNTER — Other Ambulatory Visit: Payer: Self-pay | Admitting: Oncology

## 2016-12-07 ENCOUNTER — Other Ambulatory Visit (HOSPITAL_BASED_OUTPATIENT_CLINIC_OR_DEPARTMENT_OTHER): Payer: Medicare Other

## 2016-12-07 ENCOUNTER — Encounter: Payer: Self-pay | Admitting: Oncology

## 2016-12-07 ENCOUNTER — Other Ambulatory Visit: Payer: Self-pay | Admitting: Hematology and Oncology

## 2016-12-07 ENCOUNTER — Ambulatory Visit (HOSPITAL_BASED_OUTPATIENT_CLINIC_OR_DEPARTMENT_OTHER): Payer: Medicare Other | Admitting: Oncology

## 2016-12-07 VITALS — BP 165/88 | HR 90 | Temp 98.7°F | Resp 18 | Wt 145.0 lb

## 2016-12-07 DIAGNOSIS — C541 Malignant neoplasm of endometrium: Secondary | ICD-10-CM | POA: Diagnosis not present

## 2016-12-07 DIAGNOSIS — Z5111 Encounter for antineoplastic chemotherapy: Secondary | ICD-10-CM

## 2016-12-07 DIAGNOSIS — Z853 Personal history of malignant neoplasm of breast: Secondary | ICD-10-CM

## 2016-12-07 DIAGNOSIS — R922 Inconclusive mammogram: Secondary | ICD-10-CM

## 2016-12-07 DIAGNOSIS — E119 Type 2 diabetes mellitus without complications: Secondary | ICD-10-CM

## 2016-12-07 LAB — CBC WITH DIFFERENTIAL/PLATELET
BASO%: 0.2 % (ref 0.0–2.0)
BASOS ABS: 0 10*3/uL (ref 0.0–0.1)
EOS%: 1.1 % (ref 0.0–7.0)
Eosinophils Absolute: 0.1 10*3/uL (ref 0.0–0.5)
HEMATOCRIT: 38.8 % (ref 34.8–46.6)
HEMOGLOBIN: 12.7 g/dL (ref 11.6–15.9)
LYMPH#: 1.6 10*3/uL (ref 0.9–3.3)
LYMPH%: 36.2 % (ref 14.0–49.7)
MCH: 25.8 pg (ref 25.1–34.0)
MCHC: 32.7 g/dL (ref 31.5–36.0)
MCV: 78.7 fL — ABNORMAL LOW (ref 79.5–101.0)
MONO#: 0.1 10*3/uL (ref 0.1–0.9)
MONO%: 1.1 % (ref 0.0–14.0)
NEUT%: 61.4 % (ref 38.4–76.8)
NEUTROS ABS: 2.7 10*3/uL (ref 1.5–6.5)
Platelets: 261 10*3/uL (ref 145–400)
RBC: 4.93 10*6/uL (ref 3.70–5.45)
RDW: 14.1 % (ref 11.2–14.5)
WBC: 4.4 10*3/uL (ref 3.9–10.3)

## 2016-12-07 LAB — COMPREHENSIVE METABOLIC PANEL
ALBUMIN: 3.8 g/dL (ref 3.5–5.0)
ALT: 15 U/L (ref 0–55)
AST: 17 U/L (ref 5–34)
Alkaline Phosphatase: 88 U/L (ref 40–150)
Anion Gap: 10 mEq/L (ref 3–11)
BILIRUBIN TOTAL: 0.56 mg/dL (ref 0.20–1.20)
BUN: 10.6 mg/dL (ref 7.0–26.0)
CALCIUM: 10.1 mg/dL (ref 8.4–10.4)
CO2: 28 mEq/L (ref 22–29)
CREATININE: 0.7 mg/dL (ref 0.6–1.1)
Chloride: 102 mEq/L (ref 98–109)
EGFR: >90 mL/min/{1.73_m2} (ref >90)
GLUCOSE: 101 mg/dL (ref 70–140)
Potassium: 3.8 mEq/L (ref 3.5–5.1)
Sodium: 140 mEq/L (ref 136–145)
Total Protein: 7.3 g/dL (ref 6.4–8.3)

## 2016-12-07 MED ORDER — DEXAMETHASONE 4 MG PO TABS: ORAL_TABLET | ORAL | 0 refills | Status: DC

## 2016-12-07 NOTE — Progress Notes (Signed)
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 Plan is 6 cycles of carboplatin taxol; she is to see Dr Roselind Messier in consultation on 12-17-16.  She will see gyn oncology after completion of chemo and radiation, with repeat CTs prior to that visit.   Patient has done well with first chemotherapy thus far. Peripheral IV access is limited to RUE due to previous left breast cancer, however this was easily accomplished per patient. She has had no nausea, with appetite good and bowels moving regularly without laxative or stool softeners, no taste changes. She had minimal taxol aches in LE ~ day 2, began claritin then which she continues. She is not fatigued. No peripheral neuropathy. No bleeding. No fever or symptoms of infection. She is able to sleep.  Remainder of 14 point Review of Systems negative.    ONCOLOGIC HISTORY  ENDOMETRIAL Cancer Patient had postmenopausal bleeding in 08-2016. She had PAP 09-07-16 by PCP Dr Oneta Rack, which documented carcinoma. She had evaluation by Dr Jackelyn Knife with colposcopy/ ECC/endometrial biopsy documenting serous endometrioid endometrial carcinoma. CT AP 09-15-16 showed endometrial stripe 3.5 cm with marked heterogeneity, cystic lesion in right ovary 3.5 x 2.9 cm. She had robotic assisted total hysterectomy, BSO, bilateral pelvic sentinel nodes and left paraaortic sentinal node, with mini-laparotomy for specimen removal. Surgical path ([id number]) invasive grade 3 serous endometrioid endometrial carcinoma, multifocal with largest area 8.2 cm, into outer half of myometrium and involving stroma of lower uterine segment and cervix, + LVSI, all nodes negative (8 right  obturator, 1 left obturator, 4 left paraaortic), no involvement of bilateral adnexae. Patient's post operative course was not remarkable, discharged home on POD 1. She saw Dr Duard Brady in follow up on 11-11-16, recommendation for adjuvant chemotherapy and radiation. 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 treated with lumpectomy and sentinel node evaluation 09-14-2005. She had radiation by Dr Roselind Messier completed 3-8- 2007, with tamoxifen x 5 years thru 03-25-2011. (did not tolerate Femara with swelling and aches). Followed by Dr Pierce Crane then by Dr Jeanette Caprice. Last visit by Dr Darnelle Catalan 01-24-15. Genetics testing reportedly negative.  Bilateral [facility] 11-12-16, heterogeneously dense tissue.  Objective:  Vital signs in last 24 hours:  BP (!) 165/88 (BP Location: Right Arm, Patient Position: Sitting)   Pulse 90   Temp 98.7 F (37.1 C) (Oral)   Resp 18   Wt 145 lb (65.8 kg)   SpO2 100%   BMI 26.52 kg/m  Weight stable Alert, oriented and appropriate, very pleasant. Ambulatory without difficulty, easily able to get on and off exam table No alopecia  HEENT:PERRL, sclerae not icteric. Oral mucosa moist without lesions, posterior pharynx clear.  Neck supple. No JVD.  Lymphatics:no cervical,supraclavicular or inguinal adenopathy Resp: clear to auscultation bilaterally and normal percussion bilaterally Cardio: regular rate and rhythm. No gallop. GI: abdomen soft, nontender, not distended, no mass or organomegaly. Normally active bowel sounds. Surgical incision not remarkable. Musculoskeletal/ Extremities: without pitting edema, cords, tenderness Neuro: no peripheral neuropathy. Otherwise nonfocal. PSYCH appropriate mood and affect Skin very slight erythema in area  of IV access for chemo right lower forearm, no frank cord or  tenderness. Otherwise without rash, ecchymosis, petechiae Breast exam not repeated.   Lab Results:  Results for orders placed or performed in visit on 12/07/16  CBC with Differential  Result Value Ref Range   WBC 4.4 3.9 - 10.3 10e3/uL   NEUT# 2.7 1.5 - 6.5 10e3/uL   HGB 12.7 11.6 - 15.9 g/dL   HCT 38.8 34.8 - 46.6 %   Platelets 261 145 - 400 10e3/uL   MCV 78.7 (L) 79.5 - 101.0 fL   MCH 25.8 25.1 - 34.0 pg   MCHC 32.7 31.5 - 36.0 g/dL   RBC 4.93 3.70 - 5.45 10e6/uL   RDW 14.1 11.2 - 14.5 %   lymph# 1.6 0.9 - 3.3 10e3/uL   MONO# 0.1 0.1 - 0.9 10e3/uL   Eosinophils Absolute 0.1 0.0 - 0.5 10e3/uL   Basophils Absolute 0.0 0.0 - 0.1 10e3/uL   NEUT% 61.4 38.4 - 76.8 %   LYMPH% 36.2 14.0 - 49.7 %   MONO% 1.1 0.0 - 14.0 %   EOS% 1.1 0.0 - 7.0 %   BASO% 0.2 0.0 - 2.0 %  Comprehensive metabolic panel  Result Value Ref Range   Sodium 140 136 - 145 mEq/L   Potassium 3.8 3.5 - 5.1 mEq/L   Chloride 102 98 - 109 mEq/L   CO2 28 22 - 29 mEq/L   Glucose 101 70 - 140 mg/dl   BUN 10.6 7.0 - 26.0 mg/dL   Creatinine 0.7 0.6 - 1.1 mg/dL   Total Bilirubin 0.56 0.20 - 1.20 mg/dL   Alkaline Phosphatase 88 40 - 150 U/L   AST 17 5 - 34 U/L   ALT 15 0 - 55 U/L   Total Protein 7.3 6.4 - 8.3 g/dL   Albumin 3.8 3.5 - 5.0 g/dL   Calcium 10.1 8.4 - 10.4 mg/dL   Anion Gap 10 3 - 11 mEq/L   EGFR >90 >90 ml/min/1.73 m2    CBC reviewed with patient at visit  Studies/Results: 2D DIGITAL SCREENING BILATERAL MAMMOGRAM WITH CAD AND ADJUNCT TOMO  11-18-16  COMPARISON:  Previous exam(s).  ACR Breast Density Category c: The breast tissue is heterogeneously dense, which may obscure small masses.  FINDINGS: There are no findings suspicious for malignancy. Images were processed with CAD.  IMPRESSION: No mammographic evidence of malignancy. A result letter of this screening mammogram will be mailed directly to the patient.  RECOMMENDATION: Screening mammogram in one year.  (Code:SM-B-01Y)  BI-RADS CATEGORY  1: Negative.  Medications: I have reviewed the patient's current medications. Refill premedication decadron. She used none of the antiemetics.   DISCUSSION Interval history reviewed. We are all pleased that she has done this well so far with first chemo. Patient understands that blood counts will probably decrease in next 7-10 days. She will call prior to scheduled visit on 1-22 if she is very fatigued or other concerns, recheck prior to 1-22 if so. Granix is preauthorized if needed.   Assessment/Plan:  1. 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 She is doing well so far, counts not at nadir. I will see with labs on 1-22, add gCSF if needed.  She will have consultation with Dr Sondra Come 12-17-16. 2.Stage 2 infiltrating lobular carcinoma of left breast, ER PR positive HER-2 negative at lumpectomy and sentinel node evaluation 08-2005, with  radiation by Dr Sondra Come and 5 years tamoxifen thru 03-2011 (intolerant to Femara). Not known recurrent. [facility]ilateral [facility] 11[facility]12[facility], heterogeneously dense tissue so best to have tomo mammograms. 3. Slight erythema at site of right forearm peripheral IV access: warm soaks rest of today, call if not resolved. Peripheral IV access best limited to RUE due to left axillary sentinel nodes and radiation left breast cancer. 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 Note steroid doses with each chemo, not symptomatic, follow . 8.Aortoiliac atherosclerosis by CT 08-2016 9.type 2 DM on metformin: blood sugar only 101 today.   All questions answered and they know to call if concerns prior to scheduled appointments.  Time spent 25 min including >50% counseling and coordination of care. Cc Dr [name], Dr Sondra Come   Evlyn Clines, MD   12/07/2016, 8:39 PM

## 2016-12-10 NOTE — Progress Notes (Signed)
GYN Location of Tumor / Histology: endometrial cancer  Tammy Hensley presented with symptoms of: postmenopausal bleeding  Biopsies revealed:   10/13/16 Diagnosis 1. Lymph node, sentinel, biopsy, right obturator - THERE IS NO EVIDENCE OF CARCINOMA IN 8 OF 8 LYMPH NODES (0/8). - SEE COMMENT. 2. Lymph node, sentinel, biopsy, left obturator - THERE IS NO EVIDENCE OF CARCINOMA IN 1 OF 1 LYMPH NODE (0/1). - SEE COMMENT. 3. Lymph node, sentinel, biopsy, left para-aortic - THERE IS NO EVIDENCE OF CARCINOMA IN 4 OF 4 LYMPH NODES (0/4). - SEE COMMENT. 4. Uterus +/- tubes/ovaries, neoplastic, cervix - INVASIVE MIXED SEROUS/ENDOMETRIOID ADENOCARCINOMA, MULTIPLE FOCI, FIGO GRADE III, THE LARGEST FOCUS SPANS 8.2 CM. - ADENOCARCINOMA EXTENDS INTO THE OUTER HALF OF THE MYOMETRIUM AND INVOLVES THE STROMA OF THE LOWER UTERINE SEGMENT AND CERVIX. - LYMPHOVASCULAR INVASION IS IDENTIFIED. - THE SURGICAL RESECTION MARGINS ARE NEGATIVE FOR CARCINOMA. - SEE ONCOLOGY TABLE BELOW. ADDITIONAL FINDINGS: - ENDOMETRIUM: ENDOMETRIOID TYPE POLYP(S), WHICH CONTAIN FOCI OF SIMILAR APPEARING MIXED SEROUS/ENDOMETRIOID ADENOCARCINOMA. - MYOMETRIUM: LEIOMYOMATA. - SEROSA: UNREMARKABLE. - BILATERAL ADNEXA: BENIGN OVARIES AND FALLOPIAN TUBES.  Past/Anticipated interventions by Gyn/Onc surgery, if any: 10/13/16 - Procedure: XI ROBOTIC ASSISTED SALPINGO OOPHORECTOMY and XI ROBOTIC ASSISTED LAPAROSCOPIC VAGINAL HYSTERECTOMY;  Surgeon: Nancy Marus, MD;  Past/Anticipated interventions by medical oncology, if any: Plan is for 6 cycles of carboplatin taxol.  Started chemo on 12-01-16.   Weight changes, if any:   Bowel/Bladder complaints, if any: ,   Nausea/Vomiting, if any:   Pain issues, if any:    SAFETY ISSUES:  Prior radiation?   Pacemaker/ICD?   Possible current pregnancy?   Is the patient on methotrexate?   Current Complaints / other details:

## 2016-12-11 ENCOUNTER — Other Ambulatory Visit: Payer: Self-pay | Admitting: Internal Medicine

## 2016-12-12 ENCOUNTER — Other Ambulatory Visit: Payer: Self-pay | Admitting: Oncology

## 2016-12-12 DIAGNOSIS — C541 Malignant neoplasm of endometrium: Secondary | ICD-10-CM

## 2016-12-14 ENCOUNTER — Ambulatory Visit (HOSPITAL_BASED_OUTPATIENT_CLINIC_OR_DEPARTMENT_OTHER): Payer: Medicare Other | Admitting: Oncology

## 2016-12-14 ENCOUNTER — Telehealth: Payer: Self-pay | Admitting: Oncology

## 2016-12-14 ENCOUNTER — Other Ambulatory Visit (HOSPITAL_BASED_OUTPATIENT_CLINIC_OR_DEPARTMENT_OTHER): Payer: Medicare Other

## 2016-12-14 ENCOUNTER — Encounter: Payer: Self-pay | Admitting: Oncology

## 2016-12-14 VITALS — BP 148/83 | HR 83 | Temp 98.2°F | Resp 18 | Wt 144.8 lb

## 2016-12-14 DIAGNOSIS — D701 Agranulocytosis secondary to cancer chemotherapy: Secondary | ICD-10-CM | POA: Diagnosis not present

## 2016-12-14 DIAGNOSIS — E119 Type 2 diabetes mellitus without complications: Secondary | ICD-10-CM

## 2016-12-14 DIAGNOSIS — C541 Malignant neoplasm of endometrium: Secondary | ICD-10-CM

## 2016-12-14 DIAGNOSIS — K5909 Other constipation: Secondary | ICD-10-CM | POA: Diagnosis not present

## 2016-12-14 DIAGNOSIS — Z853 Personal history of malignant neoplasm of breast: Secondary | ICD-10-CM

## 2016-12-14 DIAGNOSIS — Z5111 Encounter for antineoplastic chemotherapy: Secondary | ICD-10-CM

## 2016-12-14 DIAGNOSIS — T451X5A Adverse effect of antineoplastic and immunosuppressive drugs, initial encounter: Secondary | ICD-10-CM

## 2016-12-14 LAB — CBC WITH DIFFERENTIAL/PLATELET
BASO%: 0.9 % (ref 0.0–2.0)
Basophils Absolute: 0 10*3/uL (ref 0.0–0.1)
EOS ABS: 0 10*3/uL (ref 0.0–0.5)
EOS%: 0.6 % (ref 0.0–7.0)
HCT: 37.9 % (ref 34.8–46.6)
HGB: 12.7 g/dL (ref 11.6–15.9)
LYMPH%: 66.7 % — AB (ref 14.0–49.7)
MCH: 26.4 pg (ref 25.1–34.0)
MCHC: 33.6 g/dL (ref 31.5–36.0)
MCV: 78.6 fL — AB (ref 79.5–101.0)
MONO#: 0.4 10*3/uL (ref 0.1–0.9)
MONO%: 17.8 % — ABNORMAL HIGH (ref 0.0–14.0)
NEUT#: 0.3 10*3/uL — CL (ref 1.5–6.5)
NEUT%: 14 % — AB (ref 38.4–76.8)
PLATELETS: 318 10*3/uL (ref 145–400)
RBC: 4.82 10*6/uL (ref 3.70–5.45)
RDW: 14.2 % (ref 11.2–14.5)
WBC: 2.5 10*3/uL — ABNORMAL LOW (ref 3.9–10.3)
lymph#: 1.6 10*3/uL (ref 0.9–3.3)

## 2016-12-14 MED ORDER — TBO-FILGRASTIM 300 MCG/0.5ML ~~LOC~~ SOSY
300.0000 ug | PREFILLED_SYRINGE | Freq: Once | SUBCUTANEOUS | Status: AC
  Administered 2016-12-14: 300 ug via SUBCUTANEOUS
  Filled 2016-12-14: qty 0.5

## 2016-12-14 NOTE — Telephone Encounter (Signed)
Added f/u with NG 2/20 per 1/15 schedule message from NG. Patient already on schedule for lab 2/19.   Left message for patient re earlier add on appointments. Patient to get new schedule tomorrow.

## 2016-12-14 NOTE — Progress Notes (Signed)
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 She is neutropenic today with ANC 0.3, not febrile. She has consultation with Dr Sondra Come 12-17-16.  Patient had 4 days of uncomfortable constipation beginning 12-10-16, despite miralax once daily. She was not using any pain medication then, was fatigued during that time. Bowels moved well on 12-13-16, and she feels much better today. She has had no fever or symptoms of infection, no perirectal irritation now, no bleeding, is eating well without nausea. She had slight blood x1 after blowing nose, no other bleeding. No bladder symptoms, no respiratory symptoms, no abdominal or pelvic discomfort now. No peripheral neuropathy. Denies problems with blood sugars. She has been staying out of public places due to flu in community. Remainder of 14 point Review of Systems negative.   No central catheter. Note IV access limited to RUE since left breast cancer treatment previously. Flu vaccine 11-12-16   ONCOLOGIC HISTORY ENDOMETRIAL Cancer Patient had postmenopausal bleeding in 08-2016. She had PAP 09-07-16 by PCP Dr [name], which documented carcinoma. She had evaluation by Dr [name] with colposcopy/ ECC/endometrial biopsy documenting serous endometrioid endometrial carcinoma. CT AP 09-15-16 showed endometrial stripe 3.5 cm with marked heterogeneity, cystic lesion in right ovary 3.5 x 2.9 cm. She had robotic assisted total hysterectomy, BSO, bilateral pelvic sentinel nodes and left paraaortic sentinal node, with mini-laparotomy for specimen removal. Surgical path ([id number]) invasive grade 3 serous endometrioid endometrial carcinoma, multifocal with largest area 8.2 cm, into outer  half of myometrium and involving stroma of lower uterine segment and cervix, + LVSI, all nodes negative (8 right obturator, 1 left obturator, 4 left paraaortic), no involvement of bilateral adnexae. Patient's post operative course was not remarkable, discharged home on POD 1. She saw Dr Alycia Rossetti in follow up on 11-11-16, recommendation for adjuvant chemotherapy and radiation. 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 treated with lumpectomy and sentinel node evaluation 09-14-2005. She had radiation by Dr Sondra Come completed 3-8- 2007, with tamoxifen x 5 years thru 03-25-2011. (did not tolerate Femara with swelling and aches). Followed by Dr Eston Esters then by Dr Tressa Busman. Last visit by Dr Jana Hakim 01-24-15. Genetics testing reportedly negative.  [facility]ilateral [facility] 11-12-16, heterogeneously dense tissue.   Objective:  Vital signs in last 24 hours:  BP (!) 148/83 (BP Location: Right Arm, Patient Position: Sitting)   Pulse 83   Temp 98.2 F (36.8 C) (Oral)   Resp 18   Wt 144 lb 12.8 oz (65.7 kg)   SpO2 98%   BMI 26.48 kg/m  Weight stable Alert, oriented and appropriate, looks comfortable, very pleasant as always. Ambulatory without difficulty, easily able to get on and off exam table. No alopecia  HEENT:PERRL, sclerae not icteric. Oral mucosa moist without lesions, posterior pharynx clear. Nares not remarkable Neck supple. No JVD.  Lymphatics:no cervical,supraclavicular, axillary or inguinal adenopathy Resp: clear to auscultation bilaterally and normal percussion bilaterally Cardio: regular rate and rhythm. No gallop. GI: abdomen soft, nontender, not distended, no mass or organomegaly. A few bowel sounds. Surgical incision not remarkable. Musculoskeletal/ Extremities: without pitting  edema, cords, tenderness Neuro: no peripheral  neuropathy. Otherwise nonfocal. PSYCH appropriate mood and affect Skin without rash, ecchymosis, petechiae   Lab Results:  Results for orders placed or performed in visit on 12/14/16  CBC with Differential  Result Value Ref Range   WBC 2.5 (L) 3.9 - 10.3 10e3/uL   NEUT# 0.3 (LL) 1.5 - 6.5 10e3/uL   HGB 12.7 11.6 - 15.9 g/dL   HCT 37.9 34.8 - 46.6 %   Platelets 318 145 - 400 10e3/uL   MCV 78.6 (L) 79.5 - 101.0 fL   MCH 26.4 25.1 - 34.0 pg   MCHC 33.6 31.5 - 36.0 g/dL   RBC 4.82 3.70 - 5.45 10e6/uL   RDW 14.2 11.2 - 14.5 %   lymph# 1.6 0.9 - 3.3 10e3/uL   MONO# 0.4 0.1 - 0.9 10e3/uL   Eosinophils Absolute 0.0 0.0 - 0.5 10e3/uL   Basophils Absolute 0.0 0.0 - 0.1 10e3/uL   NEUT% 14.0 (L) 38.4 - 76.8 %   LYMPH% 66.7 (H) 14.0 - 49.7 %   MONO% 17.8 (H) 0.0 - 14.0 %   EOS% 0.6 0.0 - 7.0 %   BASO% 0.9 0.0 - 2.0 %     Studies/Results:  No results found.  Medications: I have reviewed the patient's current medications. granix begun at 300 mg today, 1-23 and possibly additional depending on counts She can increase miralax to bid as needed and/ or add senokot S 1-2 bid prn to keep bowels moving regularly.    DISCUSSION Discussed chemo neutropenia, neutropenic precautions, rationale and mechanism of action of gCSF, choice of granix due to timing of chemo now and availability of neulasta/ on pro neulasta which she may prefer for subsequent cycles. She understands that she may have aches with g CSF. Will repeat CBC on 1-24, continue granix then if [place] <=1.2.  NOTE she did not have severe taxol aches. Patient feels that she would prefer neulasta as on pro after subsequent treatments. Will request preauth.  Assessment/Plan:  1. 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 She is doing well so far, counts not at nadir. I will see with labs on 1-22, add gCSF if  needed. She will have consultation with Dr Sondra Come 12-17-16. She will have cycle 3 carbo taxol on 12-22-16 as long as labs ok within week prior, including ANC .=1.5 and plt >=100k. She does not mind labs day prior due to limited peripheral access.Dr Alvy Bimler will follow beginning Feb - thank you. 2.Stage 2 infiltrating lobular carcinoma of left breast, ER PR positive HER-2 negative at lumpectomy and sentinel node evaluation 08-2005, with radiation by Dr Sondra Come and 5 years tamoxifen thru 03-2011 (intolerant to Femara). Not known recurrent. [facility], heterogeneously dense tissue so best to have tomo mammograms. 3. Chemo neutropenia: afebrile. Add gCSF with granix now, change to on pro neulasta with next chemo if possible. Neutropenic precautions. Repeat CBC 1-24, may need additional granix then, see parameters under granix order 4. Peripheral IV access limited to RUE due to left axillary sentinel nodes and radiation left breast cancer. Consider PAC if peripheral access not sufficient 5.hypothyroid on synthroid by PCP 6.multiple medication intolerances as listed 7.Avascular necrosis of femoral heads bilaterally by CT 08-2016. Note steroid doses with each chemo, not symptomatic, follow . 8.Aortoiliac atherosclerosis by CT 08-2016 9.type 2 DM on metformin. Follows blood sugars  at home, could check CBG at [facility] day of chemo if concerns. May be more prone to chemo peripheral neuropathy with underlying diabetes, but no problems as yet. 10. Constipation this week: adjust laxatives prn 11.flu vaccine 11-12-16   All questions answered and she / husband understand discussion and are in agreement with plans above. Granix ordered, chemo orders in place for 1-30, communication with collaborative RNs for granix, message to managed care for on pro neulasta beginning cycle 2. Time spent 25 min including >50% counseling and coordination of care.  Evlyn Clines, MD   12/15/2016,  1:32 PM

## 2016-12-14 NOTE — Telephone Encounter (Signed)
Added appointments for March and April. existing appointments remain the same. paient to get new schedule tomorrow. LL patient and no new provider assigned at this time.

## 2016-12-15 ENCOUNTER — Ambulatory Visit (HOSPITAL_BASED_OUTPATIENT_CLINIC_OR_DEPARTMENT_OTHER): Payer: Medicare Other

## 2016-12-15 VITALS — BP 143/69 | HR 90 | Temp 97.8°F | Resp 20

## 2016-12-15 DIAGNOSIS — Z5189 Encounter for other specified aftercare: Secondary | ICD-10-CM | POA: Diagnosis not present

## 2016-12-15 DIAGNOSIS — C541 Malignant neoplasm of endometrium: Secondary | ICD-10-CM | POA: Diagnosis not present

## 2016-12-15 MED ORDER — TBO-FILGRASTIM 300 MCG/0.5ML ~~LOC~~ SOSY
300.0000 ug | PREFILLED_SYRINGE | Freq: Once | SUBCUTANEOUS | Status: AC
  Administered 2016-12-15: 300 ug via SUBCUTANEOUS
  Filled 2016-12-15: qty 0.5

## 2016-12-15 NOTE — Patient Instructions (Signed)
Tbo-Filgrastim injection What is this medicine? TBO-FILGRASTIM (T B O fil GRA stim) is a granulocyte colony-stimulating factor that stimulates the growth of neutrophils, a type of white blood cell important in the body's fight against infection. It is used to reduce the incidence of fever and infection in patients with certain types of cancer who are receiving chemotherapy that affects the bone marrow. COMMON BRAND NAME(S): Granix What should I tell my health care provider before I take this medicine? They need to know if you have any of these conditions: -ongoing radiation therapy -sickle cell anemia -an unusual or allergic reaction to tbo-filgrastim, filgrastim, pegfilgrastim, other medicines, foods, dyes, or preservatives -pregnant or trying to get pregnant -breast-feeding How should I use this medicine? This medicine is for injection under the skin. If you get this medicine at home, you will be taught how to prepare and give this medicine. Refer to the Instructions for Use that come with your medication packaging. Use exactly as directed. Take your medicine at regular intervals. Do not take your medicine more often than directed. It is important that you put your used needles and syringes in a special sharps container. Do not put them in a trash can. If you do not have a sharps container, call your pharmacist or healthcare provider to get one. Talk to your pediatrician regarding the use of this medicine in children. Special care may be needed. What if I miss a dose? It is important not to miss your dose. Call your doctor or health care professional if you miss a dose. What may interact with this medicine? This medicine may interact with the following medications: -medicines that may cause a release of neutrophils, such as lithium What should I watch for while using this medicine? You may need blood work done while you are taking this medicine. What side effects may I notice from receiving  this medicine? Side effects that you should report to your doctor or health care professional as soon as possible: -allergic reactions like skin rash, itching or hives, swelling of the face, lips, or tongue -shortness of breath or breathing problems -fever -pain, redness, or irritation at site where injected -pinpoint red spots on the skin -stomach or side pain, or pain at the shoulder -swelling -tiredness -trouble passing urine Side effects that usually do not require medical attention (report to your doctor or health care professional if they continue or are bothersome): -bone pain -muscle pain Where should I keep my medicine? Keep out of the reach of children. Store in a refrigerator between 2 and 8 degrees C (36 and 46 degrees F). Keep in carton to protect from light. Throw away this medicine if it is left out of the refrigerator for more than 5 consecutive days. Throw away any unused medicine after the expiration date.  2017 Elsevier/Gold Standard (2015-12-12 12:15:25)  

## 2016-12-16 ENCOUNTER — Encounter: Payer: Self-pay | Admitting: Radiation Oncology

## 2016-12-16 ENCOUNTER — Ambulatory Visit
Admission: RE | Admit: 2016-12-16 | Discharge: 2016-12-16 | Disposition: A | Discharge status: Home / Self Care | Payer: Medicare Other | Source: Ambulatory Visit | Attending: Radiation Oncology | Admitting: Radiation Oncology

## 2016-12-16 ENCOUNTER — Ambulatory Visit: Payer: Medicare Other

## 2016-12-16 ENCOUNTER — Other Ambulatory Visit (HOSPITAL_BASED_OUTPATIENT_CLINIC_OR_DEPARTMENT_OTHER): Payer: Medicare Other

## 2016-12-16 DIAGNOSIS — C541 Malignant neoplasm of endometrium: Secondary | ICD-10-CM | POA: Insufficient documentation

## 2016-12-16 LAB — CBC WITH DIFFERENTIAL/PLATELET
BASO%: 0.4 % (ref 0.0–2.0)
BASOS ABS: 0.1 10*3/uL (ref 0.0–0.1)
EOS%: 0.4 % (ref 0.0–7.0)
Eosinophils Absolute: 0.1 10*3/uL (ref 0.0–0.5)
HEMATOCRIT: 40.1 % (ref 34.8–46.6)
HEMOGLOBIN: 12.9 g/dL (ref 11.6–15.9)
LYMPH#: 2.7 10*3/uL (ref 0.9–3.3)
LYMPH%: 13.2 % — ABNORMAL LOW (ref 14.0–49.7)
MCH: 25.7 pg (ref 25.1–34.0)
MCHC: 32.3 g/dL (ref 31.5–36.0)
MCV: 79.5 fL (ref 79.5–101.0)
MONO#: 0.9 10*3/uL (ref 0.1–0.9)
MONO%: 4.4 % (ref 0.0–14.0)
NEUT%: 81.6 % — ABNORMAL HIGH (ref 38.4–76.8)
NEUTROS ABS: 17 10*3/uL — AB (ref 1.5–6.5)
Platelets: 286 10*3/uL (ref 145–400)
RBC: 5.04 10*6/uL (ref 3.70–5.45)
RDW: 14.6 % — AB (ref 11.2–14.5)
WBC: 20.8 10*3/uL — ABNORMAL HIGH (ref 3.9–10.3)

## 2016-12-16 NOTE — Progress Notes (Signed)
Radiation Oncology         [phone] ________________________________  Initial Outpatient Consultation  Name: Tammy Hensley MRN: [id number]  Date: 12/16/2016  DOB: 11-25-1940  PS:[id number] DAVID, MD  Nancy Marus, MD   REFERRING PHYSICIAN: Nancy Marus, MD  DIAGNOSIS: Stage II, grade 3 mixed serous endometrioid carcinoma of the uterus  HISTORY OF PRESENT ILLNESS::Tammy Hensley is a 76 y.o. female who presented to her PCP on 09/07/16 for postmenopausal bleeding and back pain. A pap smear was obtained revealing carcinoma. She was seen by Dr. [name] and underwent a colposcopy with ECC and endometrioid biopsy. 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 ECC and cervical biopsy were benign. A CT scan of the abdomen and pelvis on 09/15/16 showed endometrial stripe 3.5 cm with marked heterogeneity, cystic lesion in the right ovary 3.5 x 2.9 cm. Patient underwent robotic assisted salpingo oophorectomy and robotic assisted laparoscopic vaginal hysterectomy on 10/13/16. This revealed mixed serous/endometriod adenocarcinoma, FIGO grade III, spanning 8.2 cm with extension into the outer half of the myometrium and involving the stroma of the lower uterine segment and cervix. There was no evidence of carcinoma in thirteen lymph nodes (0/13). The patient was seen by Dr. Alycia Rossetti on 11/11/16 for follow-up. She recommended adjuvant chemotherapy, including 6 cycles of carboplatin taxol, and radiation. First adjuvant carboplatin taxol was given on 12/01/16. She tolerated her first cycle of chemotherapy well  The patient complains of having constipation last Thursday and reports it was alleviated with Mag citrate. Her last bowel movement was today. She denies nausea/ vomiting, headaches or pain at this time. She denies swelling or numbness in extremities since surgery.  She denies any headaches dizziness or blurred vision. She denies any new bony pain. No reports of shortness of  breath cough or chest pain. She denies any pain in either breast nipple discharge or bleeding. Patient denies any problems with swelling in her left arm or hand.  PREVIOUS RADIATION THERAPY: Yes in 2007 to her left breast under my direction as part of breast conservation therapy.  She received 50.4 gray directed at the left breast in 28 fractions. The tumor bed in the upper-outer quadrant of the left breast with boosted further to a cumulative dose of 62.4 gray. Treatments were completed on 01/27/2006. Patient received 5 years of adjuvant tamoxifen  PAST MEDICAL HISTORY:  has a past medical history of Allergy; Cancer ([place]); Depression; Diabetes mellitus without complication ([place]); Hyperlipidemia; Hypertension; Hypothyroid; Insomnia; Iron deficiency anemia; Thyroid disease; Uterine cancer ([place]); and Vitamin D deficiency.    PAST SURGICAL HISTORY: Past Surgical History:  Procedure Laterality Date  . BREAST SURGERY  2006   left breast lumpectomy- radiation, took Femara   . 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 ROBOTIC ASSISTED LAP VAGINAL HYSTERECTOMY N/A 10/13/2016   Procedure: XI ROBOTIC ASSISTED LAPAROSCOPIC VAGINAL HYSTERECTOMY;  Surgeon: Nancy Marus, MD;  Location: [facility];  Service: Gynecology;  Laterality: N/A;  . ROBOTIC ASSISTED SALPINGO OOPHERECTOMY Bilateral 10/13/2016   Procedure: XI ROBOTIC ASSISTED SALPINGO OOPHORECTOMY;  Surgeon: Nancy Marus, MD;  Location: [facility];  Service: Gynecology;  Laterality: Bilateral;  . SENTINEL NODE BIOPSY N/A 10/13/2016   Procedure: SENTINEL NODE BIOPSY;  Surgeon: Nancy Marus, MD;  Location: [facility];  Service: Gynecology;  Laterality: N/A;    FAMILY HISTORY: family history includes Cancer in her father; Heart attack in her father and mother;  Heart disease in her mother; Prostate cancer in her father.  SOCIAL HISTORY:  reports that she has never smoked. She has never used  smokeless tobacco. She reports that she does not drink alcohol or use drugs.  ALLERGIES: Calan [verapamil]; Effexor [venlafaxine]; Erythromycin; Femara [letrozole]; Fosamax [alendronate]; Ibuprofen; Paxil [paroxetine hcl]; Remeron [mirtazapine]; Tamoxifen; and Vasotec [enalapril]  MEDICATIONS:  Current Outpatient Prescriptions  Medication Sig Dispense Refill  . acetaminophen (TYLENOL) 500 MG tablet Take 500 mg by mouth daily as needed for moderate pain or headache.    Marland Kitchen aspirin 81 MG tablet Take 81 mg by mouth daily.      . Calcium Carbonate-Vitamin D (CALCIUM 600 + D PO) Take 1 tablet by mouth 2 (two) times daily.     . Cholecalciferol (VITAMIN D) 2000 units CAPS Take 2,000 Units by mouth every other day.    Marland Kitchen dexamethasone (DECADRON) 4 MG tablet Take 5 tablets (20mg ) with food 12 hours and 6 hours before taxol. 20 tablet 0  . levothyroxine (SYNTHROID, LEVOTHROID) 88 MCG tablet Take 1 tablet (88 mcg total) by mouth daily before breakfast. (Patient taking differently: Take 44 mcg by mouth daily before breakfast. Take 66mcg pill M, W ,F and Sat) 90 tablet 1  . loratadine-pseudoephedrine (CLARITIN-D 24 HOUR) 10-240 MG 24 hr tablet Take 1 tablet by mouth daily as needed for allergies.    . metFORMIN (GLUCOPHAGE-XR) 500 MG 24 hr tablet TAKE ONE TABLET BY MOUTH WITH BREAKFAST AND LUNCH AND TAKE TWO TABLETS BY MOUTH WITH SUPPER FOR DAIBETES. (Patient taking differently: takes one tablet with supper) 360 tablet 1  . Multiple Vitamins-Minerals (MULTIVITAMIN PO) Take 1 tablet by mouth daily.     . pravastatin (PRAVACHOL) 40 MG tablet Take 10 mg by mouth every evening.     . triamterene-hydrochlorothiazide (MAXZIDE) 75-50 MG tablet TAKE ONE TABLET BY MOUTH ONCE DAILY FOR BLOOD PRESSURE AND  FLUID (Patient taking differently: TAKE 1/2 TABLET BY MOUTH ONCE DAILY FOR BLOOD PRESSURE AND  FLUID) 90 tablet 3  . LORazepam (ATIVAN) 0.5 MG tablet Take 1 tablet (0.5 mg total) by mouth every 6 (six) hours as needed  (nausea). Or under tongue. Will make drowsy. (Patient not taking: Reported on 12/07/2016) 20 tablet 0  . ondansetron (ZOFRAN) 8 MG tablet Take 1 tablet (8 mg total) by mouth every 8 (eight) hours as needed for nausea or vomiting. (Patient not taking: Reported on 12/07/2016) 30 tablet 1  . traMADol (ULTRAM) 50 MG tablet Take 1 tablet (50 mg total) by mouth every 6 (six) hours as needed. (Patient not taking: Reported on 12/14/2016) 30 tablet 0   No current facility-administered medications for this encounter.     REVIEW OF SYSTEMS:  A 15 point review of systems is documented in the electronic medical record. This was obtained by the nursing staff. However, I reviewed this with the patient to discuss relevant findings and make appropriate changes.  Pertinent review of systems as noted in the history of physical examination   PHYSICAL EXAM:  height is 5\' 2"  (1.575 m) and weight is 143 lb (64.9 kg). Her oral temperature is 99 F (37.2 C). Her blood pressure is 152/81 (abnormal) and her pulse is 100. Her oxygen saturation is 96%.   General: Alert and oriented, in no acute distress HEENT: Head is normocephalic. Extraocular movements are intact. Oropharynx is clear. Neck: Neck is supple, no palpable cervical or supraclavicular lymphadenopathy. Heart: Regular in rate and rhythm with no murmurs, rubs, or gallops. Chest: Clear to  auscultation bilaterally, with no rhonchi, wheezes, or rales. Abdomen: Soft, nontender, nondistended, with no rigidity or guarding. Small scars noted from laprascopic procuedure Extremities: No cyanosis or edema. Lymphatics: see Neck Exam Skin: No concerning lesions. Musculoskeletal: symmetric strength and muscle tone throughout. Neurologic: Cranial nerves II through XII are grossly intact. No obvious focalities. Speech is fluent. Coordination is intact. Psychiatric: Judgment and insight are intact. Affect is appropriate. Breast: left breast shows mild radiation changes and smaller  than right breast. No dominant mass, nipple discharge or bleeding noted in bilateral breasts. Scar in UOQ of the left breast has healed well. Horizontal scar in suprapubic area used to remove the uterus. On pelvic examination the external genitalia were unremarkable. A speculum exam was performed. Vaginal cuff is well healed. There are no mucosal lesions noted in the vaginal vault. On bimanual examination there were no pelvic masses appreciated.   ECOG = 1  LABORATORY DATA:  Lab Results  Component Value Date   WBC 20.8 (H) 12/16/2016   HGB 12.9 12/16/2016   HCT 40.1 12/16/2016   MCV 79.5 12/16/2016   PLT 286 12/16/2016   NEUTROABS 17.0 (H) 12/16/2016   Lab Results  Component Value Date   NA 140 12/07/2016   K 3.8 12/07/2016   CL 98 (L) 10/14/2016   CO2 28 12/07/2016   GLUCOSE 101 12/07/2016   CREATININE 0.7 12/07/2016   CALCIUM 10.1 12/07/2016      RADIOGRAPHY:   EXAM: CT ABDOMEN AND PELVIS WITH CONTRAST: Markedly thickened and heterogeneous endometrial stripe suspicious for endometrial carcinoma in this patient with postmenopausal Vaginal bleeding. No evidence of metastatic spread on CT scan of abdomen and pelvis. This scan was performed 09/15/2016.   IMPRESSION: Stage II, grade 3 mixed serous endometrioid carcinoma of the uterus. Patient would be at risk for pelvic and vaginal cuff recurrence. I would recommend post operative adjuvant raidation therapy. ASTRO guidelines do recommend external beam radiation therapy for Stage II patients with high grade lesions that are deeply invasive. My inclination is to recommend both external beam and vaginal brachytherapy given the findings of lymphovascular space invasion, deeply invasive tumor, high grade lesion, and cervical stromal involvement. I did discuss this issue with Dr. Denman George who agrees with recommendations for external beam radiation therapy as well as vaginal brachytherapy. the patient will continue  with 3 cycles of chemotherapy  and then proceed with external beam radiation therapy followed by 3 more cycles of chemotherapy (sandwich therapy). The patient's vaginal brachytherapy could continue during her last 3 cycles of chemotherapy. Also we will inquire about genetic testing in light of her strong family history of prostate cancer and personal history of breast cancer. I discussed the course of treatment, side effects, and potential toxicities of external beam and vaginal brachytherapy with the patient and her husband. She appears to understand and wishes to proceed with treatment.  PLAN: Patient will be seen again after her third cycle of chemotherapy to start planning for her external beam radiation therapy.  I spent 65 minutes minutes face to face with the patient and more than 50% of that time was spent in counseling and/or coordination of care.   ------------------------------------------------  Blair Promise, PhD, MD  This document serves as a record of services personally performed by [name], MD. It was created on his behalf by Bethann Humble, a trained medical scribe. The creation of this record is based on the scribe's personal observations and the provider's statements to them. This document has been checked  and approved by the attending provider.

## 2016-12-16 NOTE — Progress Notes (Addendum)
GYN Location of Tumor / Histology: endometrial cancer  Earnestine Leys presented with symptoms of: lower back pain and postmenopausal bleeding  Biopsies revealed:   10/13/16 Diagnosis 1. Lymph node, sentinel, biopsy, right obturator - THERE IS NO EVIDENCE OF CARCINOMA IN 8 OF 8 LYMPH NODES (0/8). - SEE COMMENT. 2. Lymph node, sentinel, biopsy, left obturator - THERE IS NO EVIDENCE OF CARCINOMA IN 1 OF 1 LYMPH NODE (0/1). - SEE COMMENT. 3. Lymph node, sentinel, biopsy, left para-aortic - THERE IS NO EVIDENCE OF CARCINOMA IN 4 OF 4 LYMPH NODES (0/4). - SEE COMMENT. 4. Uterus +/- tubes/ovaries, neoplastic, cervix - INVASIVE MIXED SEROUS/ENDOMETRIOID ADENOCARCINOMA, MULTIPLE FOCI, FIGO GRADE III, THE LARGEST FOCUS SPANS 8.2 CM. - ADENOCARCINOMA EXTENDS INTO THE OUTER HALF OF THE MYOMETRIUM AND INVOLVES THE STROMA OF THE LOWER UTERINE SEGMENT AND CERVIX. - LYMPHOVASCULAR INVASION IS IDENTIFIED. - THE SURGICAL RESECTION MARGINS ARE NEGATIVE FOR CARCINOMA. - SEE ONCOLOGY TABLE BELOW. ADDITIONAL FINDINGS: - ENDOMETRIUM: ENDOMETRIOID TYPE POLYP(S), WHICH CONTAIN FOCI OF SIMILAR APPEARING MIXED SEROUS/ENDOMETRIOID ADENOCARCINOMA. - MYOMETRIUM: LEIOMYOMATA. - SEROSA: UNREMARKABLE. - BILATERAL ADNEXA: BENIGN OVARIES AND FALLOPIAN TUBES.  Past/Anticipated interventions by Gyn/Onc surgery, if any: 10/13/16 - Procedure: XI ROBOTIC ASSISTED SALPINGO OOPHORECTOMY and XI ROBOTIC ASSISTED LAPAROSCOPIC VAGINAL HYSTERECTOMY;  Surgeon: Nancy Marus, MD;  Past/Anticipated interventions by medical oncology, if any: Plan is for 6 cycles of carboplatin taxol.  Started chemo on 12-01-16.   Weight changes, if any: no  Bowel/Bladder complaints, if any: reports having constipation last Thursday and [place] mag citrate which helped.  Her last bm was today.  Nausea/Vomiting, if any: no  Pain issues, if any:  no  SAFETY ISSUES:  Prior radiation? Yes in 2007 to her left breast  Pacemaker/ICD? no  Possible  current pregnancy? no  Is the patient on methotrexate? no  Current Complaints / other details:  Patient is here with husband.  BP (!) 152/81 (BP Location: Right Arm, Patient Position: Sitting)   Pulse 100   Temp 99 F (37.2 C) (Oral)   Ht 5\' 2"  (1.575 m)   Wt 143 lb (64.9 kg)   SpO2 96%   BMI 26.16 kg/m    Wt Readings from Last 3 Encounters:  12/16/16 143 lb (64.9 kg)  12/14/16 144 lb 12.8 oz (65.7 kg)  12/07/16 145 lb (65.8 kg)

## 2016-12-16 NOTE — Progress Notes (Signed)
Please see the Nurse Progress Note in the MD Initial Consult Encounter for this patient. 

## 2016-12-16 NOTE — Progress Notes (Signed)
[place] of17 noted on lab today. No injection needed per Dr. Marko Plume note. Pt given copy of current labs and current schedule. Pt instructed to follow schedule and call office should issues occur. Pt and family member verbalized understanding of instructions.

## 2016-12-17 ENCOUNTER — Ambulatory Visit
Admission: RE | Admit: 2016-12-17 | Discharge: 2016-12-17 | Disposition: A | Discharge status: Home / Self Care | Payer: Medicare Other | Source: Ambulatory Visit | Attending: Radiation Oncology | Admitting: Radiation Oncology

## 2016-12-17 ENCOUNTER — Ambulatory Visit: Payer: Medicare Other

## 2016-12-17 DIAGNOSIS — Z90710 Acquired absence of both cervix and uterus: Secondary | ICD-10-CM | POA: Insufficient documentation

## 2016-12-17 DIAGNOSIS — Z923 Personal history of irradiation: Secondary | ICD-10-CM | POA: Insufficient documentation

## 2016-12-17 DIAGNOSIS — Z7982 Long term (current) use of aspirin: Secondary | ICD-10-CM | POA: Insufficient documentation

## 2016-12-17 DIAGNOSIS — Z888 Allergy status to other drugs, medicaments and biological substances status: Secondary | ICD-10-CM | POA: Insufficient documentation

## 2016-12-17 DIAGNOSIS — Z853 Personal history of malignant neoplasm of breast: Secondary | ICD-10-CM | POA: Insufficient documentation

## 2016-12-17 DIAGNOSIS — Z79899 Other long term (current) drug therapy: Secondary | ICD-10-CM | POA: Insufficient documentation

## 2016-12-17 DIAGNOSIS — Z8042 Family history of malignant neoplasm of prostate: Secondary | ICD-10-CM | POA: Insufficient documentation

## 2016-12-17 DIAGNOSIS — Z90722 Acquired absence of ovaries, bilateral: Secondary | ICD-10-CM | POA: Insufficient documentation

## 2016-12-17 DIAGNOSIS — C541 Malignant neoplasm of endometrium: Secondary | ICD-10-CM | POA: Insufficient documentation

## 2016-12-17 DIAGNOSIS — Z7984 Long term (current) use of oral hypoglycemic drugs: Secondary | ICD-10-CM | POA: Insufficient documentation

## 2016-12-17 DIAGNOSIS — Z510 Encounter for antineoplastic radiation therapy: Secondary | ICD-10-CM | POA: Insufficient documentation

## 2016-12-21 ENCOUNTER — Other Ambulatory Visit (HOSPITAL_BASED_OUTPATIENT_CLINIC_OR_DEPARTMENT_OTHER): Payer: Medicare Other

## 2016-12-21 DIAGNOSIS — C541 Malignant neoplasm of endometrium: Secondary | ICD-10-CM | POA: Diagnosis not present

## 2016-12-21 LAB — CBC WITH DIFFERENTIAL/PLATELET
BASO%: 0.2 % (ref 0.0–2.0)
BASOS ABS: 0 10*3/uL (ref 0.0–0.1)
EOS ABS: 0 10*3/uL (ref 0.0–0.5)
EOS%: 0.1 % (ref 0.0–7.0)
HEMATOCRIT: 40.1 % (ref 34.8–46.6)
HGB: 13.2 g/dL (ref 11.6–15.9)
LYMPH%: 32.8 % (ref 14.0–49.7)
MCH: 26 pg (ref 25.1–34.0)
MCHC: 32.8 g/dL (ref 31.5–36.0)
MCV: 79.3 fL — AB (ref 79.5–101.0)
MONO#: 0.7 10*3/uL (ref 0.1–0.9)
MONO%: 14.2 % — AB (ref 0.0–14.0)
NEUT#: 2.5 10*3/uL (ref 1.5–6.5)
NEUT%: 52.7 % (ref 38.4–76.8)
Platelets: 254 10*3/uL (ref 145–400)
RBC: 5.06 10*6/uL (ref 3.70–5.45)
RDW: 14.5 % (ref 11.2–14.5)
WBC: 4.7 10*3/uL (ref 3.9–10.3)
lymph#: 1.6 10*3/uL (ref 0.9–3.3)

## 2016-12-21 LAB — COMPREHENSIVE METABOLIC PANEL
ALK PHOS: 110 U/L (ref 40–150)
ALT: 13 U/L (ref 0–55)
AST: 16 U/L (ref 5–34)
Albumin: 3.6 g/dL (ref 3.5–5.0)
Anion Gap: 9 mEq/L (ref 3–11)
BUN: 8.7 mg/dL (ref 7.0–26.0)
CALCIUM: 9.8 mg/dL (ref 8.4–10.4)
CHLORIDE: 103 meq/L (ref 98–109)
CO2: 28 mEq/L (ref 22–29)
Creatinine: 0.7 mg/dL (ref 0.6–1.1)
Glucose: 110 mg/dl (ref 70–140)
POTASSIUM: 4.2 meq/L (ref 3.5–5.1)
SODIUM: 141 meq/L (ref 136–145)
Total Bilirubin: 0.22 mg/dL (ref 0.20–1.20)
Total Protein: 7 g/dL (ref 6.4–8.3)

## 2016-12-22 ENCOUNTER — Ambulatory Visit (HOSPITAL_BASED_OUTPATIENT_CLINIC_OR_DEPARTMENT_OTHER): Payer: Medicare Other

## 2016-12-22 VITALS — BP 161/96 | HR 102 | Temp 97.7°F | Resp 20

## 2016-12-22 DIAGNOSIS — C541 Malignant neoplasm of endometrium: Secondary | ICD-10-CM

## 2016-12-22 DIAGNOSIS — Z5189 Encounter for other specified aftercare: Secondary | ICD-10-CM

## 2016-12-22 DIAGNOSIS — Z5111 Encounter for antineoplastic chemotherapy: Secondary | ICD-10-CM

## 2016-12-22 MED ORDER — ONDANSETRON HCL 4 MG/2ML IJ SOLN
8.0000 mg | Freq: Once | INTRAMUSCULAR | Status: AC
  Administered 2016-12-22: 8 mg via INTRAVENOUS

## 2016-12-22 MED ORDER — DIPHENHYDRAMINE HCL 50 MG/ML IJ SOLN
25.0000 mg | Freq: Once | INTRAMUSCULAR | Status: AC
  Administered 2016-12-22: 25 mg via INTRAVENOUS

## 2016-12-22 MED ORDER — FAMOTIDINE IN NACL 20-0.9 MG/50ML-% IV SOLN
INTRAVENOUS | Status: AC
  Filled 2016-12-22: qty 50

## 2016-12-22 MED ORDER — CARBOPLATIN CHEMO INJECTION 600 MG/60ML
453.0000 mg | Freq: Once | INTRAVENOUS | Status: AC
  Administered 2016-12-22: 450 mg via INTRAVENOUS
  Filled 2016-12-22: qty 45

## 2016-12-22 MED ORDER — DIPHENHYDRAMINE HCL 50 MG/ML IJ SOLN
INTRAMUSCULAR | Status: AC
  Filled 2016-12-22: qty 1

## 2016-12-22 MED ORDER — DEXTROSE 5 % IV SOLN
175.0000 mg/m2 | Freq: Once | INTRAVENOUS | Status: AC
  Administered 2016-12-22: 300 mg via INTRAVENOUS
  Filled 2016-12-22: qty 50

## 2016-12-22 MED ORDER — FAMOTIDINE IN NACL 20-0.9 MG/50ML-% IV SOLN
20.0000 mg | Freq: Once | INTRAVENOUS | Status: AC
  Administered 2016-12-22: 20 mg via INTRAVENOUS

## 2016-12-22 MED ORDER — SODIUM CHLORIDE 0.9 % IV SOLN
Freq: Once | INTRAVENOUS | Status: AC
  Administered 2016-12-22: 10:00:00 via INTRAVENOUS
  Filled 2016-12-22: qty 5

## 2016-12-22 MED ORDER — PEGFILGRASTIM 6 MG/0.6ML ~~LOC~~ PSKT
6.0000 mg | PREFILLED_SYRINGE | Freq: Once | SUBCUTANEOUS | Status: AC
  Administered 2016-12-22: 6 mg via SUBCUTANEOUS
  Filled 2016-12-22: qty 0.6

## 2016-12-22 MED ORDER — SODIUM CHLORIDE 0.9 % IV SOLN
Freq: Once | INTRAVENOUS | Status: AC
  Administered 2016-12-22: 10:00:00 via INTRAVENOUS

## 2016-12-22 MED ORDER — ONDANSETRON HCL 4 MG/2ML IJ SOLN
INTRAMUSCULAR | Status: AC
  Filled 2016-12-22: qty 4

## 2016-12-22 MED ORDER — SODIUM CHLORIDE 0.9% FLUSH
10.0000 mL | INTRAVENOUS | Status: DC | PRN
  Filled 2016-12-22: qty 10

## 2016-12-22 NOTE — Patient Instructions (Signed)
[facility] Discharge Instructions for Patients Receiving Chemotherapy  Today you received the following chemotherapy agents Taxol and Carboplatin  To help prevent nausea and vomiting after your treatment, we encourage you to take your nausea medication as directed. If you develop nausea and vomiting that is not controlled by your nausea medication, call the clinic.   BELOW ARE SYMPTOMS THAT SHOULD BE REPORTED IMMEDIATELY:  *FEVER GREATER THAN 100.5 F  *CHILLS WITH OR WITHOUT FEVER  NAUSEA AND VOMITING THAT IS NOT CONTROLLED WITH YOUR NAUSEA MEDICATION  *UNUSUAL SHORTNESS OF BREATH  *UNUSUAL BRUISING OR BLEEDING  TENDERNESS IN MOUTH AND THROAT WITH OR WITHOUT PRESENCE OF ULCERS  *URINARY PROBLEMS  *BOWEL PROBLEMS  UNUSUAL RASH Items with * indicate a potential emergency and should be followed up as soon as possible.  Feel free to call the clinic you have any questions or concerns. The clinic phone number is [phone].  Please show the [facility] at check-in to the Emergency Department and triage nurse.   Paclitaxel injection (Taxol) What is this medicine? PACLITAXEL (PAK li TAX el) is a chemotherapy drug. It targets fast dividing cells, like cancer cells, and causes these cells to die. This medicine is used to treat ovarian cancer, breast cancer, and other cancers. This medicine may be used for other purposes; ask your health care provider or pharmacist if you have questions. COMMON BRAND NAME(S): Onxol, Taxol What should I tell my health care provider before I take this medicine? They need to know if you have any of these conditions: -blood disorders -irregular heartbeat -infection (especially a virus infection such as chickenpox, cold sores, or herpes) -liver disease -previous or ongoing radiation therapy -an unusual or allergic reaction to paclitaxel, alcohol, polyoxyethylated castor oil, other chemotherapy agents, other medicines, foods,  dyes, or preservatives -pregnant or trying to get pregnant -breast-feeding How should I use this medicine? This drug is given as an infusion into a vein. It is administered in a hospital or clinic by a specially trained health care professional. Talk to your pediatrician regarding the use of this medicine in children. Special care may be needed. Overdosage: If you think you have taken too much of this medicine contact a poison control center or emergency room at once. NOTE: This medicine is only for you. Do not share this medicine with others. What if I miss a dose? It is important not to miss your dose. Call your doctor or health care professional if you are unable to keep an appointment. What may interact with this medicine? Do not take this medicine with any of the following medications: -disulfiram -metronidazole This medicine may also interact with the following medications: -cyclosporine -diazepam -ketoconazole -medicines to increase blood counts like filgrastim, pegfilgrastim, sargramostim -other chemotherapy drugs like cisplatin, doxorubicin, epirubicin, etoposide, teniposide, vincristine -quinidine -testosterone -vaccines -verapamil Talk to your doctor or health care professional before taking any of these medicines: -acetaminophen -aspirin -ibuprofen -ketoprofen -naproxen This list may not describe all possible interactions. Give your health care provider a list of all the medicines, herbs, non-prescription drugs, or dietary supplements you use. Also tell them if you smoke, drink alcohol, or use illegal drugs. Some items may interact with your medicine. What should I watch for while using this medicine? Your condition will be monitored carefully while you are receiving this medicine. You will need important blood work done while you are taking this medicine. This medicine can cause serious allergic reactions. To reduce your risk you will need to  take other medicine(s)  before treatment with this medicine. If you experience allergic reactions like skin rash, itching or hives, swelling of the face, lips, or tongue, tell your doctor or health care professional right away. In some cases, you may be given additional medicines to help with side effects. Follow all directions for their use. This drug may make you feel generally unwell. This is not uncommon, as chemotherapy can affect healthy cells as well as cancer cells. Report any side effects. Continue your course of treatment even though you feel ill unless your doctor tells you to stop. Call your doctor or health care professional for advice if you get a fever, chills or sore throat, or other symptoms of a cold or flu. Do not treat yourself. This drug decreases your body's ability to fight infections. Try to avoid being around people who are sick. This medicine may increase your risk to bruise or bleed. Call your doctor or health care professional if you notice any unusual bleeding. Be careful brushing and flossing your teeth or using a toothpick because you may get an infection or bleed more easily. If you have any dental work done, tell your dentist you are receiving this medicine. Avoid taking products that contain aspirin, acetaminophen, ibuprofen, naproxen, or ketoprofen unless instructed by your doctor. These medicines may hide a fever. Do not become pregnant while taking this medicine. Women should inform their doctor if they wish to become pregnant or think they might be pregnant. There is a potential for serious side effects to an unborn child. Talk to your health care professional or pharmacist for more information. Do not breast-feed an infant while taking this medicine. Men are advised not to father a child while receiving this medicine. This product may contain alcohol. Ask your pharmacist or healthcare provider if this medicine contains alcohol. Be sure to tell all healthcare providers you are taking this  medicine. Certain medicines, like metronidazole and disulfiram, can cause an unpleasant reaction when taken with alcohol. The reaction includes flushing, headache, nausea, vomiting, sweating, and increased thirst. The reaction can last from 30 minutes to several hours. What side effects may I notice from receiving this medicine? Side effects that you should report to your doctor or health care professional as soon as possible: -allergic reactions like skin rash, itching or hives, swelling of the face, lips, or tongue -low blood counts - This drug may decrease the number of white blood cells, red blood cells and platelets. You may be at increased risk for infections and bleeding. -signs of infection - fever or chills, cough, sore throat, pain or difficulty passing urine -signs of decreased platelets or bleeding - bruising, pinpoint red spots on the skin, black, tarry stools, nosebleeds -signs of decreased red blood cells - unusually weak or tired, fainting spells, lightheadedness -breathing problems -chest pain -high or low blood pressure -mouth sores -nausea and vomiting -pain, swelling, redness or irritation at the injection site -pain, tingling, numbness in the hands or feet -slow or irregular heartbeat -swelling of the ankle, feet, hands Side effects that usually do not require medical attention (report to your doctor or health care professional if they continue or are bothersome): -bone pain -complete hair loss including hair on your head, underarms, pubic hair, eyebrows, and eyelashes -changes in the color of fingernails -diarrhea -loosening of the fingernails -loss of appetite -muscle or joint pain -red flush to skin -sweating This list may not describe all possible side effects. Call your doctor for medical advice about  side effects. You may report side effects to FDA at 1-800-FDA-1088. Where should I keep my medicine? This drug is given in a hospital or clinic and will not be  stored at home. NOTE: This sheet is a summary. It may not cover all possible information. If you have questions about this medicine, talk to your doctor, pharmacist, or health care provider.  2017 Elsevier/Gold Standard (2015-09-10 19:58:00)   Carboplatin injection What is this medicine? CARBOPLATIN (KAR boe pla tin) is a chemotherapy drug. It targets fast dividing cells, like cancer cells, and causes these cells to die. This medicine is used to treat ovarian cancer and many other cancers. This medicine may be used for other purposes; ask your health care provider or pharmacist if you have questions. COMMON BRAND NAME(S): Paraplatin What should I tell my health care provider before I take this medicine? They need to know if you have any of these conditions: -blood disorders -hearing problems -kidney disease -recent or ongoing radiation therapy -an unusual or allergic reaction to carboplatin, cisplatin, other chemotherapy, other medicines, foods, dyes, or preservatives -pregnant or trying to get pregnant -breast-feeding How should I use this medicine? This drug is usually given as an infusion into a vein. It is administered in a hospital or clinic by a specially trained health care professional. Talk to your pediatrician regarding the use of this medicine in children. Special care may be needed. Overdosage: If you think you have taken too much of this medicine contact a poison control center or emergency room at once. NOTE: This medicine is only for you. Do not share this medicine with others. What if I miss a dose? It is important not to miss a dose. Call your doctor or health care professional if you are unable to keep an appointment. What may interact with this medicine? -medicines for seizures -medicines to increase blood counts like filgrastim, pegfilgrastim, sargramostim -some antibiotics like amikacin, gentamicin, neomycin, streptomycin, tobramycin -vaccines Talk to your doctor  or health care professional before taking any of these medicines: -acetaminophen -aspirin -ibuprofen -ketoprofen -naproxen This list may not describe all possible interactions. Give your health care provider a list of all the medicines, herbs, non-prescription drugs, or dietary supplements you use. Also tell them if you smoke, drink alcohol, or use illegal drugs. Some items may interact with your medicine. What should I watch for while using this medicine? Your condition will be monitored carefully while you are receiving this medicine. You will need important blood work done while you are taking this medicine. This drug may make you feel generally unwell. This is not uncommon, as chemotherapy can affect healthy cells as well as cancer cells. Report any side effects. Continue your course of treatment even though you feel ill unless your doctor tells you to stop. In some cases, you may be given additional medicines to help with side effects. Follow all directions for their use. Call your doctor or health care professional for advice if you get a fever, chills or sore throat, or other symptoms of a cold or flu. Do not treat yourself. This drug decreases your body's ability to fight infections. Try to avoid being around people who are sick. This medicine may increase your risk to bruise or bleed. Call your doctor or health care professional if you notice any unusual bleeding. Be careful brushing and flossing your teeth or using a toothpick because you may get an infection or bleed more easily. If you have any dental work done, tell your dentist  you are receiving this medicine. Avoid taking products that contain aspirin, acetaminophen, ibuprofen, naproxen, or ketoprofen unless instructed by your doctor. These medicines may hide a fever. Do not become pregnant while taking this medicine. Women should inform their doctor if they wish to become pregnant or think they might be pregnant. There is a potential  for serious side effects to an unborn child. Talk to your health care professional or pharmacist for more information. Do not breast-feed an infant while taking this medicine. What side effects may I notice from receiving this medicine? Side effects that you should report to your doctor or health care professional as soon as possible: -allergic reactions like skin rash, itching or hives, swelling of the face, lips, or tongue -signs of infection - fever or chills, cough, sore throat, pain or difficulty passing urine -signs of decreased platelets or bleeding - bruising, pinpoint red spots on the skin, black, tarry stools, nosebleeds -signs of decreased red blood cells - unusually weak or tired, fainting spells, lightheadedness -breathing problems -changes in hearing -changes in vision -chest pain -high blood pressure -low blood counts - This drug may decrease the number of white blood cells, red blood cells and platelets. You may be at increased risk for infections and bleeding. -nausea and vomiting -pain, swelling, redness or irritation at the injection site -pain, tingling, numbness in the hands or feet -problems with balance, talking, walking -trouble passing urine or change in the amount of urine Side effects that usually do not require medical attention (report to your doctor or health care professional if they continue or are bothersome): -hair loss -loss of appetite -metallic taste in the mouth or changes in taste This list may not describe all possible side effects. Call your doctor for medical advice about side effects. You may report side effects to FDA at 1-800-FDA-1088. Where should I keep my medicine? This drug is given in a hospital or clinic and will not be stored at home. NOTE: This sheet is a summary. It may not cover all possible information. If you have questions about this medicine, talk to your doctor, pharmacist, or health care provider.  2017 Elsevier/Gold Standard  (2008-02-14 14:38:05)

## 2016-12-23 ENCOUNTER — Ambulatory Visit: Payer: Medicare Other

## 2016-12-31 ENCOUNTER — Ambulatory Visit (HOSPITAL_BASED_OUTPATIENT_CLINIC_OR_DEPARTMENT_OTHER): Payer: Medicare Other | Admitting: Genetic Counselor

## 2016-12-31 ENCOUNTER — Encounter: Payer: Self-pay | Admitting: Genetic Counselor

## 2016-12-31 ENCOUNTER — Other Ambulatory Visit (HOSPITAL_BASED_OUTPATIENT_CLINIC_OR_DEPARTMENT_OTHER): Payer: Medicare Other

## 2016-12-31 DIAGNOSIS — C541 Malignant neoplasm of endometrium: Secondary | ICD-10-CM | POA: Diagnosis not present

## 2016-12-31 DIAGNOSIS — Z809 Family history of malignant neoplasm, unspecified: Secondary | ICD-10-CM

## 2016-12-31 LAB — COMPREHENSIVE METABOLIC PANEL
ALT: 15 U/L (ref 0–55)
ANION GAP: 10 meq/L (ref 3–11)
AST: 16 U/L (ref 5–34)
Albumin: 4.1 g/dL (ref 3.5–5.0)
Alkaline Phosphatase: 149 U/L (ref 40–150)
BILIRUBIN TOTAL: 0.25 mg/dL (ref 0.20–1.20)
BUN: 8.3 mg/dL (ref 7.0–26.0)
CALCIUM: 10.7 mg/dL — AB (ref 8.4–10.4)
CHLORIDE: 99 meq/L (ref 98–109)
CO2: 29 mEq/L (ref 22–29)
CREATININE: 0.8 mg/dL (ref 0.6–1.1)
EGFR: 89 mL/min/{1.73_m2} — ABNORMAL LOW (ref >90)
Glucose: 115 mg/dl (ref 70–140)
Potassium: 3.7 mEq/L (ref 3.5–5.1)
Sodium: 139 mEq/L (ref 136–145)
Total Protein: 7.8 g/dL (ref 6.4–8.3)

## 2016-12-31 LAB — CBC WITH DIFFERENTIAL/PLATELET
BASO%: 0.7 % (ref 0.0–2.0)
Basophils Absolute: 0.1 10*3/uL (ref 0.0–0.1)
EOS ABS: 0 10*3/uL (ref 0.0–0.5)
EOS%: 0.2 % (ref 0.0–7.0)
HEMATOCRIT: 40.6 % (ref 34.8–46.6)
HEMOGLOBIN: 13.5 g/dL (ref 11.6–15.9)
LYMPH%: 20.3 % (ref 14.0–49.7)
MCH: 25.8 pg (ref 25.1–34.0)
MCHC: 33.3 g/dL (ref 31.5–36.0)
MCV: 77.6 fL — ABNORMAL LOW (ref 79.5–101.0)
MONO#: 1.4 10*3/uL — AB (ref 0.1–0.9)
MONO%: 11.1 % (ref 0.0–14.0)
NEUT%: 67.7 % (ref 38.4–76.8)
NEUTROS ABS: 8.4 10*3/uL — AB (ref 1.5–6.5)
NRBC: 0 % (ref 0–0)
PLATELETS: 300 10*3/uL (ref 145–400)
RBC: 5.23 10*6/uL (ref 3.70–5.45)
RDW: 15 % — AB (ref 11.2–14.5)
WBC: 12.4 10*3/uL — AB (ref 3.9–10.3)
lymph#: 2.5 10*3/uL (ref 0.9–3.3)

## 2016-12-31 NOTE — Progress Notes (Signed)
[facility]   Patient Name: Tammy Hensley Patient DOB: [birth year] Encounter Date: 12/31/2016  Referring Provider: [name], MD  Primary Care Provider: Alesia Richards, MD  Reason for Visit: Evaluate for hereditary susceptibility to cancer  Ms. Tammy Hensley, a 76 y.o. female, is being seen at the [facility] due to a personal and family history of cancer. She presents to clinic today to discuss the possibility of a hereditary predisposition to cancer and discuss whether genetic testing is warranted.  History of Present Illness: Ms. 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    Past Medical History:  Diagnosis Date  . Allergy   . Cancer ([place])    left breast  . Depression   . Diabetes mellitus without complication ([place])   . Family history of cancer   . Hyperlipidemia   . Hypertension   . Hypothyroid   . Insomnia   . Iron deficiency anemia   . Thyroid disease   . Uterine cancer ([place])   . Vitamin D deficiency     Past Surgical History:  Procedure Laterality Date  . BREAST SURGERY  2006   left breast lumpectomy- radiation, took Femara   . 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 ROBOTIC ASSISTED LAP VAGINAL HYSTERECTOMY N/A 10/13/2016   Procedure: XI ROBOTIC ASSISTED LAPAROSCOPIC VAGINAL HYSTERECTOMY;  Surgeon: Nancy Marus, MD;  Location: [facility];  Service: Gynecology;  Laterality: N/A;  . ROBOTIC ASSISTED SALPINGO OOPHERECTOMY Bilateral 10/13/2016   Procedure: XI ROBOTIC ASSISTED SALPINGO OOPHORECTOMY;  Surgeon: Nancy Marus, MD;  Location: [facility];  Service: Gynecology;  Laterality: Bilateral;  . SENTINEL NODE BIOPSY N/A 10/13/2016   Procedure: SENTINEL NODE BIOPSY;  Surgeon: Nancy Marus, MD;  Location: [facility];  Service: Gynecology;  Laterality: N/A;     Social History   Social History  . Marital status: Married    Spouse name: N/A  . Number of children: 2  . Years of education: N/A   Social History Main Topics  . Smoking status: Never Smoker  . Smokeless tobacco: Never Used  . Alcohol use No  . Drug use: No  . Sexual activity: Not on file   Other Topics Concern  . Not on file   Social History Narrative  . No narrative on file     Family History:  During the visit, a 4-generation pedigree was obtained. Family tree will be scanned in the Media tab in Epic  Significant diagnoses include the following:  Family History  Problem Relation Age of Onset  . Heart disease Mother   . Heart attack Mother   . Cancer Father   . Heart attack Father   . Prostate cancer Father 92    deceased 59  . Breast cancer Paternal Aunt 5    deceased 37s  . Prostate cancer Paternal Uncle 31    deceased 24  . Cancer Paternal Grandmother     unsure; deceased 48s  . Prostate cancer Brother 2    currently 51  . Prostate cancer Brother 62    currently 27    Additionally, she has a son (age 95) and a daughter (age 46). She had a total of 4 sisters and 5 brothers. Her mother died at 6, cancer-free. Her mother  had 3 brothers. Her father had a total of 4 sisters and 2 brothers.  Ms. Tammy Hensley's ancestry is African American. There is no known Jewish ancestry and no consanguinity.  Assessment and Plan: Ms. Tammy Hensley is a 76 y.o. female with a personal history of breast and uterine cancers and a family history as noted above. Given her ages at diagnosis and her large family, this history is not suggestive of a hereditary predisposition to cancer. Genetic testing is recommended to determine whether she has a pathogenic mutation that would impact her cancer screening and risk-reduction options. We reviewed the characteristics, features and inheritance patterns of hereditary cancer syndromes. We discussed the process of genetic testing, including  insurance coverage and implications of results: positive, negative and Variant of Uncertain Significance. A negative result will be reassuring.   Ms. Tammy Hensley wished to pursue genetic testing and a blood sample will be sent for analysis of the 80 genes on [facility]'s Multi-Cancer panel (ALK, APC, ATM, AXIN2, BAP1, BARD1, BLM, BMPR1A, BRCA1, BRCA2, BRIP1, CASR, CDC73, CDH1, CDK4, CDKN1B, CDKN1C, CDKN2A, CEBPA, CHEK2, DICER1, DIS3L2, EGFR, EPCAM, FH, FLCN, GATA2, GPC3, GREM1, HOXB13,  HRAS, KIT, MAX, MEN1, MET, MITF, MLH1, MSH2, MSH6, MUTYH, NBN, NF1, NF2, PALB2, PDGFRA, PHOX2B, PMS2, POLD1, POLE, POT1, PRKAR1A, PTCH1, PTEN, RAD50, RAD51C, RAD51D, RB1, RECQL4, RET, RUNX1, SDHA, SDHAF2, SDHB, SDHC, SDHD, SMAD4, SMARCA4, SMARCB1, SMARCE1, STK11, SUFU, TERC, TERT, TMEM127, TP53, TSC1, TSC2, VHL, WRN, and WT1). Results should be available in approximately 2-4 weeks, at which point we will contact her and address implications for her as well as address genetic testing for at-risk family members, if needed.    Ms. Tammy Hensley is encouraged to remain in contact with Cancer Genetics annually so that we can update the family history and inform her of any changes in cancer genetics and testing that may be of benefit for this family. Ms.  Tammy Hensley questions were answered to her satisfaction today and she is welcome to call with any additional questions or concerns. Thank you for the referral and allowing [place] to share in the care of your patient.   Dr. Alvy Bimler was available for questions concerning this case. Total time spent by [name], MS, CGC in face-to-face counseling was approximately 35 minutes.   [name], MS, [place] Certified Genetic Counselor phone: [phone] [email or web address]   ______________________________________________________________________ For Office Staff:  Number of people involved in session: 1 Was an Intern/ student involved with case: no

## 2017-01-01 ENCOUNTER — Telehealth: Payer: Self-pay | Admitting: *Deleted

## 2017-01-01 ENCOUNTER — Other Ambulatory Visit: Payer: Self-pay | Admitting: Hematology and Oncology

## 2017-01-01 ENCOUNTER — Encounter: Payer: Self-pay | Admitting: Hematology and Oncology

## 2017-01-01 ENCOUNTER — Ambulatory Visit: Payer: Medicare Other

## 2017-01-01 DIAGNOSIS — Z1379 Encounter for other screening for genetic and chromosomal anomalies: Secondary | ICD-10-CM | POA: Insufficient documentation

## 2017-01-01 DIAGNOSIS — C541 Malignant neoplasm of endometrium: Secondary | ICD-10-CM

## 2017-01-01 NOTE — Telephone Encounter (Signed)
As noted by Dr.Gorsuch, I called and reviewed appointment times and dates for February. Patient verbalized understanding.

## 2017-01-04 ENCOUNTER — Telehealth: Payer: Self-pay | Admitting: Oncology

## 2017-01-04 NOTE — Telephone Encounter (Signed)
Called [place] to see when she will completed her third cycle of chemotherapy.  She said she will have cycle 3 on 01/12/17.  Advised her that [facility], Art therapist will call her with an appointment for next week.  Alecea verbalized understanding and agreement.

## 2017-01-06 ENCOUNTER — Encounter: Payer: Self-pay | Admitting: Internal Medicine

## 2017-01-06 ENCOUNTER — Other Ambulatory Visit: Payer: Self-pay | Admitting: Internal Medicine

## 2017-01-06 ENCOUNTER — Ambulatory Visit (INDEPENDENT_AMBULATORY_CARE_PROVIDER_SITE_OTHER): Payer: Medicare Other | Admitting: Internal Medicine

## 2017-01-06 VITALS — BP 128/80 | HR 88 | Temp 97.4°F | Resp 18 | Ht 62.5 in | Wt 142.0 lb

## 2017-01-06 DIAGNOSIS — E559 Vitamin D deficiency, unspecified: Secondary | ICD-10-CM

## 2017-01-06 DIAGNOSIS — Z79899 Other long term (current) drug therapy: Secondary | ICD-10-CM

## 2017-01-06 DIAGNOSIS — I10 Essential (primary) hypertension: Secondary | ICD-10-CM | POA: Diagnosis not present

## 2017-01-06 DIAGNOSIS — C541 Malignant neoplasm of endometrium: Secondary | ICD-10-CM

## 2017-01-06 DIAGNOSIS — E119 Type 2 diabetes mellitus without complications: Secondary | ICD-10-CM | POA: Diagnosis not present

## 2017-01-06 DIAGNOSIS — E782 Mixed hyperlipidemia: Secondary | ICD-10-CM | POA: Diagnosis not present

## 2017-01-06 LAB — LIPID PANEL
CHOL/HDL RATIO: 3.2 ratio (ref <5.0)
Cholesterol: 133 mg/dL (ref <200)
HDL: 42 mg/dL — AB (ref >50)
LDL Cholesterol: 59 mg/dL (ref <100)
TRIGLYCERIDES: 162 mg/dL — AB (ref <150)
VLDL: 32 mg/dL — AB (ref <30)

## 2017-01-06 LAB — TSH: TSH: 0.86 m[IU]/L

## 2017-01-06 NOTE — Progress Notes (Signed)
[facility] [name], M.D.        Tammy Hensley, P.A.-C       Tammy Hensley, P.A.-C  [name]                [address] [name], [name] [id number] Telephone [phone] Telefax [phone] ______________________________________________________________________     This very nice [name] presents for 6 month follow up with Hypertension, Hyperlipidemia, T2_NIDDM, Hypothyroidism  and Vitamin D Deficiency.      In Oct/Nov 2017, patient was dx'd with Endometrial Cancer and underwent Robotic BSO and Vag Hys with neg radical lymphadenectomy. Patient treatment plans are for adjuvant chemoradiation with Dr's Alvy Bimler & Kinard. Patient does have remote hx/o L Breast Cancer (2007) treated with primary hormonal therapy with adjuvant Tamoxifen.      Patient is treated for HTN & BP has been controlled at home. Today's BP is at goal - 128/80. Patient has had no complaints of any cardiac type chest pain, palpitations, dyspnea/orthopnea/PND, dizziness, claudication, or dependent edema.     Hyperlipidemia is controlled with diet & meds. Patient denies myalgias or other med SE's. Last Lipids were at goal: Lab Results  Component Value Date   CHOL 113 (L) 07/09/2016   HDL 47 07/09/2016   LDLCALC 48 07/09/2016   TRIG 88 07/09/2016   CHOLHDL 2.4 07/09/2016      Patient has been on Thyroid replacement circa 2006. Also, the patient has history of T2_NIDDM (2009) and has had no symptoms of reactive hypoglycemia, diabetic polys, paresthesias or visual blurring.  She reports recent CBG's are ranging in the 90-100 mg% range.  Last A1c was not at goal, but her weight is down 9# since then: Lab Results  Component Value Date   HGBA1C 6.2 (H) 10/08/2016      Further, the patient also has history of Vitamin D Deficiency and supplements vitamin D without any suspected side-effects. Last vitamin D was at goal:  Lab Results   Component Value Date   VD25OH 78 04/02/2016   Current Outpatient Prescriptions on File Prior to Visit  Medication Sig  . acetaminophen  500 MG tablet Take  daily as needed   . aspirin 81 MG Take  daily.    Marland Kitchen CALCIUM 600 + D Take 1 tab 2  times daily.   Marland Kitchen VITAMIN D 2000 units  Take 2,000 Units  every other day.  Marland Kitchen dexamethasone  4 MG tablet Take 5 tab (20mg ) with food 12 hours and 6 hours before taxol.  . levothyroxine  88 MCG  Take 44 mcg M, W ,F and Sat)  . CLARITIN-D 24 HR 10-240 MG Take 1 tab daily as needed for allergies.  . metFORMIN-XR 500 MG  takes one tablet with supper  . Multi-Vit w/Min Take 1 tab daily.   . pravastatin  40 MG  Take 1/4 tab =10 mg  every evening.   . triamterene-hctz 75-50  TAKE 1/2 TAB ONCE DAILY    Allergies  Allergen Reactions  . Calan [Verapamil]     Constipation   . Effexor [Venlafaxine]     unknown  . Erythromycin Nausea And Vomiting  . Femara [Letrozole] Swelling  . Fosamax [Alendronate]     aching  . Ibuprofen     Dizziness, incoherent   . Paxil [Paroxetine Hcl] Nausea Only  . Remeron [Mirtazapine]     Weight  gain   . Tamoxifen     Hair loss @ low dose  . Vasotec [Enalapril] Cough   PMHx:   Past Medical History:  Diagnosis Date  . Allergy   . Cancer ([place])    left breast  . Depression   . Diabetes mellitus without complication ([place])   . Family history of cancer   . Hyperlipidemia   . Hypertension   . Hypothyroid   . Insomnia   . Iron deficiency anemia   . Thyroid disease   . Uterine cancer ([place])   . Vitamin D deficiency    Immunization History  Administered Date(s) Administered  . Influenza,inj,Quad PF,36+ Mos 11/12/2016  . Pneumococcal-Unspecified 12/29/2011  . Td 12/29/2011   Past Surgical History:  Procedure Laterality Date  . BREAST SURGERY  2006   left breast lumpectomy- radiation, took Femara   . 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  ROBOTIC ASSISTED LAP VAGINAL HYSTERECTOMY N/A 10/13/2016   Procedure: XI ROBOTIC ASSISTED LAPAROSCOPIC VAGINAL HYSTERECTOMY;  Surgeon: Nancy Marus, MD;  Location: [facility];  Service: Gynecology;  Laterality: N/A;  . ROBOTIC ASSISTED SALPINGO OOPHERECTOMY Bilateral 10/13/2016   Procedure: XI ROBOTIC ASSISTED SALPINGO OOPHORECTOMY;  Surgeon: Nancy Marus, MD;  Location: [facility];  Service: Gynecology;  Laterality: Bilateral;  . SENTINEL NODE BIOPSY N/A 10/13/2016   Procedure: SENTINEL NODE BIOPSY;  Surgeon: Nancy Marus, MD;  Location: [facility];  Service: Gynecology;  Laterality: N/A;   FHx:    Reviewed / unchanged  SHx:    Reviewed / unchanged  Systems Review:  Constitutional: Denies fever, chills, wt changes, headaches, insomnia, fatigue, night sweats, change in appetite. Eyes: Denies redness, blurred vision, diplopia, discharge, itchy, watery eyes.  ENT: Denies discharge, congestion, post nasal drip, epistaxis, sore throat, earache, hearing loss, dental pain, tinnitus, vertigo, sinus pain, snoring.  CV: Denies chest pain, palpitations, irregular heartbeat, syncope, dyspnea, diaphoresis, orthopnea, PND, claudication or edema. Respiratory: denies cough, dyspnea, DOE, pleurisy, hoarseness, laryngitis, wheezing.  Gastrointestinal: Denies dysphagia, odynophagia, heartburn, reflux, water brash, abdominal pain or cramps, nausea, vomiting, bloating, diarrhea, constipation, hematemesis, melena, hematochezia  or hemorrhoids. Genitourinary: Denies dysuria, frequency, urgency, nocturia, hesitancy, discharge, hematuria or flank pain. Musculoskeletal: Denies arthralgias, myalgias, stiffness, jt. swelling, pain, limping or strain/sprain.  Skin: Denies pruritus, rash, hives, warts, acne, eczema or change in skin lesion(s). Neuro: No weakness, tremor, incoordination, spasms, paresthesia or pain. Psychiatric: Denies confusion, memory loss or sensory loss. Endo: Denies change in weight, skin or hair change.   Heme/Lymph: No excessive bleeding, bruising or enlarged lymph nodes.  Physical Exam  BP 128/80   Pulse 88   Temp 97.4 F (36.3 C)   Resp 18   Ht 5' 2.5" (1.588 m)   Wt 142 lb (64.4 kg)   BMI 25.56 kg/m   Appears well nourished and in no distress.  Eyes: PERRLA, EOMs, conjunctiva no swelling or erythema. Sinuses: No frontal/maxillary tenderness ENT/Mouth: EAC's clear, TM's nl w/o erythema, bulging. Nares clear w/o erythema, swelling, exudates. Oropharynx clear without erythema or exudates. Oral hygiene is good. Tongue normal, non obstructing. Hearing intact.  Neck: Supple. Thyroid nl. Car 2+/2+ without bruits, nodes or JVD. Chest: Respirations nl with BS clear & equal w/o rales, rhonchi, wheezing or stridor.  Cor: Heart sounds normal w/ regular rate and rhythm without sig. murmurs, gallops, clicks, or rubs. Peripheral pulses normal and equal  without edema.  Abdomen: Soft & bowel  sounds normal. Non-tender w/o guarding, rebound, hernias, masses, or organomegaly.  Lymphatics: Unremarkable.  Musculoskeletal: Full ROM all peripheral extremities, joint stability, 5/5 strength, and normal gait.  Skin: Warm, dry without exposed rashes, lesions or ecchymosis apparent.  Neuro: Cranial nerves intact, reflexes equal bilaterally. Sensory-motor testing grossly intact. Tendon reflexes grossly intact.  Pysch: Alert & oriented x 3.  Insight and judgement nl & appropriate. No ideations.  Assessment and Plan:  1. Essential hypertension  - Continue medication, monitor blood pressure at home.  - Continue DASH diet. Reminder to go to the ER if any CP,  SOB, nausea, dizziness, severe HA, changes vision/speech,  left arm numbness and tingling and jaw pain.  - TSH  2. Mixed hyperlipidemia  - Continue diet/meds, exercise,& lifestyle modifications.   - Continue monitor periodic cholesterol/liver & renal functions  - Lipid panel - TSH  3. T2_NIDDM  - Continue diet, exercise, lifestyle  modifications.  - Monitor appropriate labs.  - Hemoglobin A1c - Insulin, random  4. Vitamin D deficiency  - Continue supplementation - VITAMIN D 25 Hydroxy  5. Endometrial cancer ([place])   6. Medication management  - Magnesium - Lipid panel - TSH - Hemoglobin A1c - Insulin, random - VITAMIN D 25 Hydroxy      Recommended regular exercise, BP monitoring, weight control, and discussed med and SE's. Recommended labs to assess and monitor clinical status. Further disposition pending results of labs. Over 30 minutes of exam, counseling, chart review was performed

## 2017-01-06 NOTE — Patient Instructions (Signed)

## 2017-01-07 LAB — HEMOGLOBIN A1C
Hgb A1c MFr Bld: 6.6 % — ABNORMAL HIGH (ref <5.7)
Mean Plasma Glucose: 143 mg/dL

## 2017-01-07 LAB — VITAMIN D 25 HYDROXY (VIT D DEFICIENCY, FRACTURES): Vit D, 25-Hydroxy: 65 ng/mL (ref 30–100)

## 2017-01-07 LAB — MAGNESIUM: Magnesium: 1.8 mg/dL (ref 1.5–2.5)

## 2017-01-07 LAB — INSULIN, RANDOM: INSULIN: 54.8 u[IU]/mL — AB (ref 2.0–19.6)

## 2017-01-08 NOTE — Progress Notes (Signed)
Pt aware of lab results & voiced understanding of those results.

## 2017-01-11 ENCOUNTER — Other Ambulatory Visit: Payer: Medicare Other

## 2017-01-11 ENCOUNTER — Ambulatory Visit: Payer: Self-pay | Admitting: Genetic Counselor

## 2017-01-11 DIAGNOSIS — Z1379 Encounter for other screening for genetic and chromosomal anomalies: Secondary | ICD-10-CM

## 2017-01-11 NOTE — Progress Notes (Signed)
[facility]    Patient Name: JOSEPHENE MARRONE Patient DOB: [birth year] Patient Age: 76 y.o. Encounter Date: 01/11/2017  Referring Provider: [name], MD  Primary Care Provider: Alesia Richards, MD   Ms. Whisner was called today to discuss genetic test results. Please see the Genetics note from her visit on 12/31/16 for a detailed discussion of her personal and family history.  Genetic Testing: At the time of Ms. Camplin's visit, we recommended she pursue genetic testing of multiple associated with a hereditary predisposition to cancer. Testing included sequencing and deletion/duplication analysis. Testing was normal and did not reveal a mutation in these genes. A copy of the genetic test report will be scanned into Epic under the media tab.  The genes tested were the 80 genes on [facility]'s Multi-Cancer panel (ALK, APC, ATM, AXIN2, BAP1, BARD1, BLM, BMPR1A, BRCA1, BRCA2, BRIP1, CASR, CDC73, CDH1, CDK4, CDKN1B, CDKN1C, CDKN2A, CEBPA, CHEK2, DICER1, DIS3L2, EGFR, EPCAM, FH, FLCN, GATA2, GPC3, GREM1, HOXB13,  HRAS, KIT, MAX, MEN1, MET, MITF, MLH1, MSH2, MSH6, MUTYH, NBN, NF1, NF2, PALB2, PDGFRA, PHOX2B, PMS2, POLD1, POLE, POT1, PRKAR1A, PTCH1, PTEN, RAD50, RAD51C, RAD51D, RB1, RECQL4, RET, RUNX1, SDHA, SDHAF2, SDHB, SDHC, SDHD, SMAD4, SMARCA4, SMARCB1, SMARCE1, STK11, SUFU, TERC, TERT, TMEM127, TP53, TSC1, TSC2, VHL, WRN, and WT1).  Since the current test is not perfect, it is possible there may be a gene mutation that current testing cannot detect, but that chance is small. We also discussed that it is possible that a different genetic factor, which was not part of this testing or has not yet been discovered, is responsible for the cancer diagnoses in the family. Again, the likelihood of this is low. No additional testing is recommended at this time.   Analysis detected three Variants of Unknown Significance: PALB2 c.3125C>G (p.Thr1042Ser),  PMS2 c.13G>C (p.Glu5Gln) and RAD50 c.2177G>A (p.Arg726His). At this time, it is unknown if this finding is associated with increased cancer risk, but most variants get reclassified to being inconsequential. This finding should not be used to make medical management decisions. With time, we suspect the lab will determine the significance of it, if any. If we do learn more about it, we will try to contact Ms. Campo to discuss it further. However, it is important to stay in touch with [place] periodically and keep the address and phone number up to date.  Cancer Screening: This result suggests that Ms. Digangi's cancers were most likely not due to an inherited predisposition. Most cancers happen by chance and this negative test, along with details of her family history, suggests that her cancers fall into this category. We, therefore, recommended she continue to follow the cancer screening guidelines provided by her physician.   Family Members:  Family members are at some increased risk of developing cancer, over the general population risk, simply due to the family history. We recommended women have a yearly mammogram beginning at age 42, a yearly clinical breast exam, and perform monthly breast self-exams. A gynecologic exam is recommended yearly. Colon cancer screening is recommended to begin by age 50 for men and women.  Any relative who had cancer at a young age or had a particularly rare cancer may also wish to pursue genetic testing. Genetic counselors can be located in other cities, by visiting the website of the [employer] ([employer]ist[employer].se) and Field seismologist for a Dietitian by zip code. Family members should not get tested for the above VUSs  outside of a research protocol as this finding has no implications for their medical management.  Lastly, cancer genetics is a rapidly advancing field and it is possible that new genetic tests will be appropriate for her in the future.  We encourage her to remain in contact with [place] on an annual basis so we can update her personal and family histories, and let her know of advances in cancer genetics that may benefit the family. Our contact number was provided. Ms. Lenger is welcome to call anytime with additional questions.    [name], MS, [place] Certified Genetic Counselor phone: [phone] [email or web address]

## 2017-01-12 ENCOUNTER — Telehealth: Payer: Self-pay | Admitting: Hematology and Oncology

## 2017-01-12 ENCOUNTER — Other Ambulatory Visit: Payer: Self-pay | Admitting: Hematology and Oncology

## 2017-01-12 ENCOUNTER — Other Ambulatory Visit (HOSPITAL_BASED_OUTPATIENT_CLINIC_OR_DEPARTMENT_OTHER): Payer: Medicare Other

## 2017-01-12 ENCOUNTER — Encounter: Payer: Self-pay | Admitting: Hematology and Oncology

## 2017-01-12 ENCOUNTER — Ambulatory Visit (HOSPITAL_BASED_OUTPATIENT_CLINIC_OR_DEPARTMENT_OTHER): Payer: Medicare Other | Admitting: Hematology and Oncology

## 2017-01-12 ENCOUNTER — Ambulatory Visit (HOSPITAL_BASED_OUTPATIENT_CLINIC_OR_DEPARTMENT_OTHER): Payer: Medicare Other

## 2017-01-12 DIAGNOSIS — Z5189 Encounter for other specified aftercare: Secondary | ICD-10-CM | POA: Diagnosis not present

## 2017-01-12 DIAGNOSIS — C541 Malignant neoplasm of endometrium: Secondary | ICD-10-CM | POA: Diagnosis not present

## 2017-01-12 DIAGNOSIS — Z170 Estrogen receptor positive status [ER+]: Secondary | ICD-10-CM

## 2017-01-12 DIAGNOSIS — Z5111 Encounter for antineoplastic chemotherapy: Secondary | ICD-10-CM | POA: Diagnosis not present

## 2017-01-12 DIAGNOSIS — Z853 Personal history of malignant neoplasm of breast: Secondary | ICD-10-CM

## 2017-01-12 DIAGNOSIS — I10 Essential (primary) hypertension: Secondary | ICD-10-CM

## 2017-01-12 DIAGNOSIS — C50412 Malignant neoplasm of upper-outer quadrant of left female breast: Secondary | ICD-10-CM

## 2017-01-12 LAB — COMPREHENSIVE METABOLIC PANEL
ALT: 15 U/L (ref 0–55)
AST: 13 U/L (ref 5–34)
Albumin: 3.9 g/dL (ref 3.5–5.0)
Alkaline Phosphatase: 107 U/L (ref 40–150)
Anion Gap: 15 mEq/L — ABNORMAL HIGH (ref 3–11)
BUN: 14.5 mg/dL (ref 7.0–26.0)
CHLORIDE: 101 meq/L (ref 98–109)
CO2: 23 meq/L (ref 22–29)
Calcium: 10.3 mg/dL (ref 8.4–10.4)
Creatinine: 0.7 mg/dL (ref 0.6–1.1)
GLUCOSE: 188 mg/dL — AB (ref 70–140)
POTASSIUM: 3.8 meq/L (ref 3.5–5.1)
SODIUM: 138 meq/L (ref 136–145)
TOTAL PROTEIN: 7.3 g/dL (ref 6.4–8.3)
Total Bilirubin: 0.3 mg/dL (ref 0.20–1.20)

## 2017-01-12 LAB — CBC WITH DIFFERENTIAL/PLATELET
BASO%: 0.4 % (ref 0.0–2.0)
Basophils Absolute: 0 10*3/uL (ref 0.0–0.1)
EOS ABS: 0 10*3/uL (ref 0.0–0.5)
EOS%: 0 % (ref 0.0–7.0)
HCT: 36.6 % (ref 34.8–46.6)
HGB: 12.2 g/dL (ref 11.6–15.9)
LYMPH%: 8.5 % — AB (ref 14.0–49.7)
MCH: 26.3 pg (ref 25.1–34.0)
MCHC: 33.4 g/dL (ref 31.5–36.0)
MCV: 78.8 fL — AB (ref 79.5–101.0)
MONO#: 0 10*3/uL — ABNORMAL LOW (ref 0.1–0.9)
MONO%: 0.3 % (ref 0.0–14.0)
NEUT%: 90.8 % — ABNORMAL HIGH (ref 38.4–76.8)
NEUTROS ABS: 8.9 10*3/uL — AB (ref 1.5–6.5)
Platelets: 329 10*3/uL (ref 145–400)
RBC: 4.65 10*6/uL (ref 3.70–5.45)
RDW: 15.5 % — ABNORMAL HIGH (ref 11.2–14.5)
WBC: 9.8 10*3/uL (ref 3.9–10.3)
lymph#: 0.8 10*3/uL — ABNORMAL LOW (ref 0.9–3.3)

## 2017-01-12 MED ORDER — PEGFILGRASTIM 6 MG/0.6ML ~~LOC~~ PSKT
6.0000 mg | PREFILLED_SYRINGE | Freq: Once | SUBCUTANEOUS | Status: AC
  Administered 2017-01-12: 6 mg via SUBCUTANEOUS
  Filled 2017-01-12: qty 0.6

## 2017-01-12 MED ORDER — HEPARIN SOD (PORK) LOCK FLUSH 100 UNIT/ML IV SOLN
500.0000 [IU] | Freq: Once | INTRAVENOUS | Status: AC | PRN
  Administered 2017-01-12: 500 [IU]
  Filled 2017-01-12: qty 5

## 2017-01-12 MED ORDER — DIPHENHYDRAMINE HCL 50 MG/ML IJ SOLN
25.0000 mg | Freq: Once | INTRAMUSCULAR | Status: AC
  Administered 2017-01-12: 25 mg via INTRAVENOUS

## 2017-01-12 MED ORDER — SODIUM CHLORIDE 0.9 % IV SOLN
Freq: Once | INTRAVENOUS | Status: AC
  Administered 2017-01-12: 11:00:00 via INTRAVENOUS
  Filled 2017-01-12: qty 5

## 2017-01-12 MED ORDER — FAMOTIDINE IN NACL 20-0.9 MG/50ML-% IV SOLN
20.0000 mg | Freq: Once | INTRAVENOUS | Status: AC
  Administered 2017-01-12: 20 mg via INTRAVENOUS

## 2017-01-12 MED ORDER — CARBOPLATIN CHEMO INJECTION 600 MG/60ML
453.0000 mg | Freq: Once | INTRAVENOUS | Status: AC
  Administered 2017-01-12: 450 mg via INTRAVENOUS
  Filled 2017-01-12: qty 45

## 2017-01-12 MED ORDER — DEXAMETHASONE 4 MG PO TABS: ORAL_TABLET | ORAL | 0 refills | Status: DC

## 2017-01-12 MED ORDER — ONDANSETRON HCL 4 MG/2ML IJ SOLN
INTRAMUSCULAR | Status: AC
  Filled 2017-01-12: qty 2

## 2017-01-12 MED ORDER — FAMOTIDINE IN NACL 20-0.9 MG/50ML-% IV SOLN
INTRAVENOUS | Status: AC
  Filled 2017-01-12: qty 50

## 2017-01-12 MED ORDER — PACLITAXEL CHEMO INJECTION 300 MG/50ML
175.0000 mg/m2 | Freq: Once | INTRAVENOUS | Status: AC
  Administered 2017-01-12: 300 mg via INTRAVENOUS
  Filled 2017-01-12: qty 50

## 2017-01-12 MED ORDER — SODIUM CHLORIDE 0.9 % IV SOLN
Freq: Once | INTRAVENOUS | Status: AC
  Administered 2017-01-12: 10:00:00 via INTRAVENOUS

## 2017-01-12 MED ORDER — DIPHENHYDRAMINE HCL 50 MG/ML IJ SOLN
INTRAMUSCULAR | Status: AC
  Filled 2017-01-12: qty 1

## 2017-01-12 MED ORDER — ONDANSETRON HCL 4 MG/2ML IJ SOLN
8.0000 mg | Freq: Once | INTRAMUSCULAR | Status: AC
  Administered 2017-01-12: 8 mg via INTRAVENOUS

## 2017-01-12 MED ORDER — SODIUM CHLORIDE 0.9% FLUSH
10.0000 mL | INTRAVENOUS | Status: DC | PRN
  Administered 2017-01-12: 10 mL
  Filled 2017-01-12: qty 10

## 2017-01-12 NOTE — Patient Instructions (Signed)
[facility] Discharge Instructions for Patients Receiving Chemotherapy  Today you received the following chemotherapy agents:  Carboplatin, Taxol  To help prevent nausea and vomiting after your treatment, we encourage you to take your nausea medication as prescribed.   If you develop nausea and vomiting that is not controlled by your nausea medication, call the clinic.   BELOW ARE SYMPTOMS THAT SHOULD BE REPORTED IMMEDIATELY:  *FEVER GREATER THAN 100.5 F  *CHILLS WITH OR WITHOUT FEVER  NAUSEA AND VOMITING THAT IS NOT CONTROLLED WITH YOUR NAUSEA MEDICATION  *UNUSUAL SHORTNESS OF BREATH  *UNUSUAL BRUISING OR BLEEDING  TENDERNESS IN MOUTH AND THROAT WITH OR WITHOUT PRESENCE OF ULCERS  *URINARY PROBLEMS  *BOWEL PROBLEMS  UNUSUAL RASH Items with * indicate a potential emergency and should be followed up as soon as possible.  Feel free to call the clinic you have any questions or concerns. The clinic phone number is [phone].  Please show the CHEMO ALERT CARD at check-in to the Emergency Department and triage nurse.   

## 2017-01-12 NOTE — Telephone Encounter (Signed)
Oncology treatment plan changed/adjusted with labs and follow up appointments with Dr Alvy Bimler, per 01/12/17 los. Patient was given a copy of the AVS report and appointment schedule,per 01/12/17 los.

## 2017-01-14 NOTE — Assessment & Plan Note (Signed)
She has remote history of breast cancer. Continue screening program after she completes her treatment for uterine cancer Recent genetic testing was negative 

## 2017-01-14 NOTE — Assessment & Plan Note (Signed)
She appears to be tolerating treatment very well. We will proceed with treatment without dose adjustment. She will begin radiation therapy soon.

## 2017-01-14 NOTE — Assessment & Plan Note (Signed)
she will continue current medical management. She felt that her blood pressure is elevated today due to anxiety I recommend close follow-up with primary care doctor for medication adjustment.  

## 2017-01-14 NOTE — Progress Notes (Signed)
[facility] progress notes  Patient Care Team: Tammy Pinto, MD as PCP - General (Internal Medicine) Tammy Campbell, MD as Consulting Physician (Gastroenterology) Tammy Peach, MD as Consulting Physician (Ophthalmology) Tammy Cruel, MD as Consulting Physician (Oncology) Tammy Fowler, MD as Consulting Physician (Obstetrics and Gynecology)  CHIEF COMPLAINTS/PURPOSE OF VISIT:  Endometrial cancer, undergoing chemotherapy  HISTORY OF PRESENTING ILLNESS:  Tammy Hensley 76 y.o. female was transferred to my care after her prior physician has left.  She is here today accompanied by her husband of 83 years, Tammy Hensley.  They have 2 children together The patient has interesting history of breast cancer and was recently diagnosed with endometrial cancer I reviewed the patient's records extensive and collaborated the history with the patient. 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 SEE COMMENT.  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 The greatest extent of invasive carcinoma as measured on the needle core biopsy measures 0.6 cm.         Endometrial cancer ([place])   02/03/2002 Pathology Results    1. BENIGN ENDOMETRIAL POLYPS.  2. UTERINE FIBROIDS: PORTIONS OF SMOOTH MUSCLE CONSISTENT WITH BENIGN LEIOMYOMA(S). PORTIONS OF BENIGN, CYSTICALLY ATROPHIC ENDOMETRIUM WITHOUT HYPERPLASIA OR EVIDENCE OF MALIGNANCY. 3. ENDOMETRIAL CURETTAGE: BENIGN ENDOMETRIUM AND SMOOTH MUSCLE.      09/07/2016 Pathology Results    PAP smear positive for malignant cells      09/07/2016 Initial Diagnosis    Patient had postmenopausal bleeding in 08-2016. She had PAP 09-07-16 by PCP Dr Tammy Hensley, which documented  carcinoma. She had evaluation by Dr Tammy Hensley with colposcopy/ ECC/endometrial biopsy documenting serous endometrioid endometrial carcinoma.      09/15/2016 Imaging    Ct imaging showed markedly thickened and heterogeneous endometrial stripe suspicious for endometrial carcinoma in this patient with postmenopausal vaginal bleeding. Cystic lesion in the right ovary cannot be definitively characterized. Pelvic ultrasound is recommended for further evaluation. Aortoiliac atherosclerosis. Avascular necrosis of the femoral heads bilaterally.      10/07/2016 Tumor Marker    Patient's tumor was tested for the following markers: CA125 Results of the tumor marker test revealed 15.1      10/13/2016 Pathology Results    1. Lymph node, sentinel, biopsy, right obturator - THERE IS NO EVIDENCE OF CARCINOMA IN 8 OF 8 LYMPH NODES (0/8). - SEE COMMENT. 2. Lymph node, sentinel, biopsy, left obturator - THERE IS NO EVIDENCE OF CARCINOMA IN 1 OF 1 LYMPH NODE (0/1). - SEE COMMENT. 3. Lymph node, sentinel, biopsy, left para-aortic - THERE IS NO EVIDENCE OF CARCINOMA IN 4 OF 4 LYMPH NODES (0/4). - SEE COMMENT. 4. Uterus +/- tubes/ovaries, neoplastic, cervix - INVASIVE MIXED SEROUS/ENDOMETRIOID ADENOCARCINOMA, MULTIPLE FOCI, FIGO GRADE III, THE LARGEST FOCUS SPANS 8.2 CM. - ADENOCARCINOMA EXTENDS INTO THE OUTER HALF OF THE MYOMETRIUM AND INVOLVES THE STROMA OF THE LOWER UTERINE SEGMENT AND CERVIX. - LYMPHOVASCULAR INVASION IS IDENTIFIED. - THE SURGICAL RESECTION MARGINS ARE NEGATIVE FOR CARCINOMA. - SEE ONCOLOGY TABLE BELOW. ADDITIONAL FINDINGS: - ENDOMETRIUM: ENDOMETRIOID TYPE POLYP(S), WHICH CONTAIN FOCI OF SIMILAR APPEARING MIXED SEROUS/ENDOMETRIOID ADENOCARCINOMA. - MYOMETRIUM: LEIOMYOMATA. - SEROSA: UNREMARKABLE. - BILATERAL ADNEXA: BENIGN OVARIES AND FALLOPIAN TUBES.      10/13/2016 Surgery    Surgery: Total robotic hysterectomy bilateral salpingo-oophorectomy,  bilateral pelvic sentinel lymph  node removal, left PA sentinel lymph node removal. Mini-laparotomy for specimen removal Surgeons:  Tammy Brock A. Duard Brady, MD; Tammy Char, MD  Pathology:  1)Uterus, cervix, bilateral tubes and ovaries 2) Right obturator SLN 3) Left Obturator SLN 4) Left PA SLN Operative findings: 14 week fibroid uterus with one large dominant 6 cm myoma that was palpably calcified. 4 cm right ovarian cyst. + SLN identified in the bilateral obturator spaces, + SLN identified in the left PA space.       12/01/2016 -  Chemotherapy    She received carboplatin & Taxol      01/11/2017 Genetic Testing    Testing was normal and did not reveal a mutation in these genes: The genes tested were the 80 genes on [employer]'s Multi-Cancer panel (ALK, APC, ATM, AXIN2, BAP1, BARD1, BLM, BMPR1A, BRCA1, BRCA2, BRIP1, CASR, CDC73, CDH1, CDK4, CDKN1B, CDKN1C, CDKN2A, CEBPA, CHEK2, DICER1, DIS3L2, EGFR, EPCAM, FH, FLCN, GATA2, GPC3, GREM1, HOXB13,  HRAS, KIT, MAX, MEN1, MET, MITF, MLH1, MSH2, MSH6, MUTYH, NBN, NF1, NF2, PALB2, PDGFRA, PHOX2B, PMS2, POLD1, POLE, POT1, PRKAR1A, PTCH1, PTEN, RAD50, RAD51C, RAD51D, RB1, RECQL4, RET, RUNX1, SDHA, SDHAF2, SDHB, SDHC, SDHD, SMAD4, SMARCA4, SMARCB1, SMARCE1, STK11, SUFU, TERC, TERT, TMEM127, TP53, TSC1, TSC2, VHL, WRN, and WT1).      She returns for follow-up. Remarkably, she has no side effects or treatment. Denies nausea, vomiting, changes in bowel habits or neuropathy. MEDICAL HISTORY:  Past Medical History:  Diagnosis Date  . Depression   . Family history of cancer   . Hyperlipidemia   . Hypertension   . Iron deficiency anemia   . Vitamin D deficiency     SURGICAL HISTORY: Past Surgical History:  Procedure Laterality Date  . BREAST SURGERY  2006   left breast lumpectomy- radiation, took Femara   . 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 ROBOTIC ASSISTED LAP VAGINAL HYSTERECTOMY N/A 10/13/2016    Procedure: XI ROBOTIC ASSISTED LAPAROSCOPIC VAGINAL HYSTERECTOMY;  Surgeon: Tammy Mccreedy, MD;  Location: [facility];  Service: Gynecology;  Laterality: N/A;  . ROBOTIC ASSISTED SALPINGO OOPHERECTOMY Bilateral 10/13/2016   Procedure: XI ROBOTIC ASSISTED SALPINGO OOPHORECTOMY;  Surgeon: Tammy Mccreedy, MD;  Location: [facility];  Service: Gynecology;  Laterality: Bilateral;  . SENTINEL NODE BIOPSY N/A 10/13/2016   Procedure: SENTINEL NODE BIOPSY;  Surgeon: Tammy Mccreedy, MD;  Location: [facility];  Service: Gynecology;  Laterality: N/A;    SOCIAL HISTORY: Social History   Social History  . Marital status: Married    Spouse name: N/A  . Number of children: 2  . Years of education: N/A   Occupational History  . Not on file.   Social History Main Topics  . Smoking status: Never Smoker  . Smokeless tobacco: Never Used  . Alcohol use No  . Drug use: No  . Sexual activity: Not on file   Other Topics Concern  . Not on file   Social History Narrative  . No narrative on file    FAMILY HISTORY: Family History  Problem Relation Age of Onset  . Heart disease Mother   . Heart attack Mother   . Cancer Father   . Heart attack Father   . Prostate cancer Father 25    deceased 57  . Breast cancer Paternal Aunt 54    deceased 65s  . Prostate cancer Paternal Uncle 17    deceased 55  .  Cancer Paternal Grandmother     unsure; deceased 5s  . Prostate cancer Brother 46    currently 80  . Prostate cancer Brother 58    currently 55    ALLERGIES:  is allergic to calan [verapamil]; effexor [venlafaxine]; erythromycin; femara [letrozole]; fosamax [alendronate]; ibuprofen; paxil [paroxetine hcl]; remeron [mirtazapine]; tamoxifen; and vasotec [enalapril].  MEDICATIONS:  Current Outpatient Prescriptions  Medication Sig Dispense Refill  . acetaminophen (TYLENOL) 500 MG tablet Take 500 mg by mouth daily as needed for moderate pain or headache.    Marland Kitchen aspirin 81 MG tablet Take 81 mg by mouth daily.      .  Calcium Carbonate-Vitamin D (CALCIUM 600 + D PO) Take 1 tablet by mouth 2 (two) times daily.     . Cholecalciferol (VITAMIN D) 2000 units CAPS Take 2,000 Units by mouth every other day.    Marland Kitchen dexamethasone (DECADRON) 4 MG tablet Take 5 tablets ('20mg'$ ) with food 12 hours and 6 hours before taxol. 30 tablet 0  . levothyroxine (SYNTHROID, LEVOTHROID) 88 MCG tablet Take 1 tablet (88 mcg total) by mouth daily before breakfast. (Patient taking differently: Take 44 mcg by mouth daily before breakfast. Take 11mg pill M, W ,F and Sat) 90 tablet 1  . loratadine-pseudoephedrine (CLARITIN-D 24 HOUR) 10-240 MG 24 hr tablet Take 1 tablet by mouth daily as needed for allergies.    . metFORMIN (GLUCOPHAGE-XR) 500 MG 24 hr tablet TAKE ONE TABLET BY MOUTH WITH BREAKFAST AND LUNCH AND TAKE TWO TABLETS BY MOUTH WITH SUPPER FOR DAIBETES. (Patient taking differently: takes one tablet with supper) 360 tablet 1  . Multiple Vitamins-Minerals (MULTIVITAMIN PO) Take 1 tablet by mouth daily.     . pravastatin (PRAVACHOL) 40 MG tablet Take 10 mg by mouth every evening.     . triamterene-hydrochlorothiazide (MAXZIDE) 75-50 MG tablet TAKE ONE TABLET BY MOUTH ONCE DAILY FOR BLOOD PRESSURE AND  FLUID (Patient taking differently: TAKE 1/2 TABLET BY MOUTH ONCE DAILY FOR BLOOD PRESSURE AND  FLUID) 90 tablet 3   No current facility-administered medications for this visit.     REVIEW OF SYSTEMS:   Constitutional: Denies fevers, chills or abnormal night sweats Eyes: Denies blurriness of vision, double vision or watery eyes Ears, nose, mouth, throat, and face: Denies mucositis or sore throat Respiratory: Denies cough, dyspnea or wheezes Cardiovascular: Denies palpitation, chest discomfort or lower extremity swelling Gastrointestinal:  Denies nausea, heartburn or change in bowel habits Skin: Denies abnormal skin rashes Lymphatics: Denies new lymphadenopathy or easy bruising Neurological:Denies numbness, tingling or new  weaknesses Behavioral/Psych: Mood is stable, no new changes  All other systems were reviewed with the patient and are negative.  PHYSICAL EXAMINATION: ECOG PERFORMANCE STATUS: 0 - Asymptomatic  Vitals:   01/12/17 0851  BP: (!) 162/82  Pulse: (!) 102  Resp: 18  Temp: 98.4 F (36.9 C)   Filed Weights   01/12/17 0851  Weight: 147 lb 1.6 oz (66.7 kg)    GENERAL:alert, no distress and comfortable SKIN: skin color, texture, turgor are normal, no rashes or significant lesions EYES: normal, conjunctiva are pink and non-injected, sclera clear OROPHARYNX:no exudate, normal lips, buccal mucosa, and tongue  NECK: supple, thyroid normal size, non-tender, without nodularity LYMPH:  no palpable lymphadenopathy in the cervical, axillary or inguinal LUNGS: clear to auscultation and percussion with normal breathing effort HEART: regular rate & rhythm and no murmurs without lower extremity edema ABDOMEN:abdomen soft, non-tender and normal bowel sounds Musculoskeletal:no cyanosis of digits and no clubbing  PSYCH: alert &  oriented x 3 with fluent speech NEURO: no focal motor/sensory deficits  LABORATORY DATA:  I have reviewed the data as listed Lab Results  Component Value Date   WBC 9.8 01/12/2017   HGB 12.2 01/12/2017   HCT 36.6 01/12/2017   MCV 78.8 (L) 01/12/2017   PLT 329 01/12/2017    Recent Labs  04/02/16 1537 07/09/16 0950  10/08/16 1048 10/14/16 0444  12/21/16 0902 12/31/16 1056 01/12/17 0819  NA 137 140  --  138 132*  < > 141 139 138  K 4.2 3.6  --  4.1 3.4*  < > 4.2 3.7 3.8  CL 97* 100  --  100* 98*  --   --   --   --   CO2 29 28  --  31 26  < > '28 29 23  '$ GLUCOSE 74 105*  --  108* 195*  < > 110 115 188*  BUN 9 10  --  12 9  < > 8.7 8.3 14.5  CREATININE 0.72 0.69  < > 0.69 0.65  < > 0.7 0.8 0.7  CALCIUM 10.1 9.8  --  10.0 8.4*  < > 9.8 10.7* 10.3  GFRNONAA 83 85  --  >60 >60  --   --   --   --   GFRAA >89 >89  --  >60 >60  --   --   --   --   PROT 7.2 7.1  --  7.7   --   < > 7.0 7.8 7.3  ALBUMIN 4.5 4.2  --  4.5  --   < > 3.6 4.1 3.9  AST 18 20  --  23  --   < > '16 16 13  '$ ALT 11 13  --  14  --   < > '13 15 15  '$ ALKPHOS 79 85  --  85  --   < > 110 149 107  BILITOT 0.6 0.7  --  1.0  --   < > 0.22 0.25 0.30  BILIDIR 0.1 0.2  --   --   --   --   --   --   --   IBILI 0.5 0.5  --   --   --   --   --   --   --   < > = values in this interval not displayed.  ASSESSMENT & PLAN:  Endometrial cancer ([place]) She appears to be tolerating treatment very well. We will proceed with treatment without dose adjustment. She will begin radiation therapy soon.  Breast cancer, left breast [facility]) She has remote history of breast cancer. Continue screening program after she completes her treatment for uterine cancer Recent genetic testing was negative  Hypertension she will continue current medical management. She felt that her blood pressure is elevated today due to anxiety I recommend close follow-up with primary care doctor for medication adjustment.    No orders of the defined types were placed in this encounter.   All questions were answered. The patient knows to call the clinic with any problems, questions or concerns. I spent 30 minutes counseling the patient face to face. The total time spent in the appointment was 60 minutes and more than 50% was on counseling.     [name], MD 01/14/2017 6:34 AM

## 2017-01-29 NOTE — Progress Notes (Signed)
GYN Location of Tumor / Histology: endometrial cancer  Tammy Hensley presented with symptoms of: lower back pain and postmenopausal bleeding  Biopsies revealed:   10/13/16 Diagnosis 1. Lymph node, sentinel, biopsy, right obturator - THERE IS NO EVIDENCE OF CARCINOMA IN 8 OF 8 LYMPH NODES (0/8). - SEE COMMENT. 2. Lymph node, sentinel, biopsy, left obturator - THERE IS NO EVIDENCE OF CARCINOMA IN 1 OF 1 LYMPH NODE (0/1). - SEE COMMENT. 3. Lymph node, sentinel, biopsy, left para-aortic - THERE IS NO EVIDENCE OF CARCINOMA IN 4 OF 4 LYMPH NODES (0/4). - SEE COMMENT. 4. Uterus +/- tubes/ovaries, neoplastic, cervix - INVASIVE MIXED SEROUS/ENDOMETRIOID ADENOCARCINOMA, MULTIPLE FOCI, FIGO GRADE III, THE LARGEST FOCUS SPANS 8.2 CM. - ADENOCARCINOMA EXTENDS INTO THE OUTER HALF OF THE MYOMETRIUM AND INVOLVES THE STROMA OF THE LOWER UTERINE SEGMENT AND CERVIX. - LYMPHOVASCULAR INVASION IS IDENTIFIED. - THE SURGICAL RESECTION MARGINS ARE NEGATIVE FOR CARCINOMA. - SEE ONCOLOGY TABLE BELOW. ADDITIONAL FINDINGS: - ENDOMETRIUM: ENDOMETRIOID TYPE POLYP(S), WHICH CONTAIN FOCI OF SIMILAR APPEARING MIXED SEROUS/ENDOMETRIOID ADENOCARCINOMA. - MYOMETRIUM: LEIOMYOMATA. - SEROSA: UNREMARKABLE. - BILATERAL ADNEXA: BENIGN OVARIES AND FALLOPIAN TUBES.  Past/Anticipated interventions by Gyn/Onc surgery, if any: 10/13/16 - Procedure: XI ROBOTIC ASSISTED SALPINGO OOPHORECTOMY and XI ROBOTIC ASSISTED LAPAROSCOPIC VAGINAL HYSTERECTOMY;  Surgeon: Nancy Marus, MD;  Past/Anticipated interventions by medical oncology, if any: Plan is for 6 cycles of carboplatin taxol.  Started chemo on 12-01-16.  She will complete 4 cycles tomorrow.  Weight changes, if any: 2-3 lbs  Bowel/Bladder complaints, if any: reports urinary frequency since chemotherapy started, She reports having constipation the day of chemotherapy  Nausea/Vomiting, if any: no  Pain issues, if any:  no  SAFETY ISSUES:  Prior radiation? Yes in 2007  to her left breast  Pacemaker/ICD? no  Possible current pregnancy? no  Is the patient on methotrexate? no  Current Complaints / other details: .  BP (!) 142/79 (BP Location: Right Arm)   Pulse (!) 102   Temp 98.3 F (36.8 C) (Oral)   Ht 5' 2.5" (1.588 m)   Wt 146 lb (66.2 kg)   SpO2 100%   BMI 26.28 kg/m    Wt Readings from Last 3 Encounters:  02/01/17 146 lb (66.2 kg)  01/12/17 147 lb 1.6 oz (66.7 kg)  01/06/17 142 lb (64.4 kg)    Wt Readings from Last 3 Encounters:  01/12/17 147 lb 1.6 oz (66.7 kg)  01/06/17 142 lb (64.4 kg)  12/16/16 143 lb (64.9 kg)

## 2017-02-01 ENCOUNTER — Ambulatory Visit
Admission: RE | Admit: 2017-02-01 | Discharge: 2017-02-01 | Disposition: A | Discharge status: Home / Self Care | Payer: Medicare Other | Source: Ambulatory Visit | Attending: Radiation Oncology | Admitting: Radiation Oncology

## 2017-02-01 ENCOUNTER — Other Ambulatory Visit: Payer: Medicare Other

## 2017-02-01 ENCOUNTER — Encounter: Payer: Self-pay | Admitting: Radiation Oncology

## 2017-02-01 VITALS — BP 142/79 | HR 102 | Temp 98.3°F | Ht 62.5 in | Wt 146.0 lb

## 2017-02-01 DIAGNOSIS — Z79899 Other long term (current) drug therapy: Secondary | ICD-10-CM | POA: Diagnosis not present

## 2017-02-01 DIAGNOSIS — Z7984 Long term (current) use of oral hypoglycemic drugs: Secondary | ICD-10-CM | POA: Diagnosis not present

## 2017-02-01 DIAGNOSIS — C541 Malignant neoplasm of endometrium: Secondary | ICD-10-CM | POA: Diagnosis not present

## 2017-02-01 DIAGNOSIS — Z923 Personal history of irradiation: Secondary | ICD-10-CM | POA: Diagnosis not present

## 2017-02-01 DIAGNOSIS — Z7982 Long term (current) use of aspirin: Secondary | ICD-10-CM | POA: Diagnosis not present

## 2017-02-01 DIAGNOSIS — Z510 Encounter for antineoplastic radiation therapy: Secondary | ICD-10-CM | POA: Diagnosis present

## 2017-02-01 DIAGNOSIS — Z90710 Acquired absence of both cervix and uterus: Secondary | ICD-10-CM | POA: Diagnosis not present

## 2017-02-01 DIAGNOSIS — Z90722 Acquired absence of ovaries, bilateral: Secondary | ICD-10-CM | POA: Diagnosis not present

## 2017-02-01 DIAGNOSIS — Z853 Personal history of malignant neoplasm of breast: Secondary | ICD-10-CM | POA: Diagnosis not present

## 2017-02-01 DIAGNOSIS — Z888 Allergy status to other drugs, medicaments and biological substances status: Secondary | ICD-10-CM | POA: Diagnosis not present

## 2017-02-01 DIAGNOSIS — Z8042 Family history of malignant neoplasm of prostate: Secondary | ICD-10-CM | POA: Diagnosis not present

## 2017-02-01 NOTE — Progress Notes (Signed)
Radiation Oncology         [phone] ________________________________  Name: Tammy Hensley MRN: [id number]  Date: 02/01/2017  DOB: 21-Oct-1941  Re-Evaluation Visit Note  CC: Alesia Richards, MD  Nancy Marus, MD    ICD-9-CM ICD-10-CM   1. 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  She received 50.4 gray directed at the left breast in 28 fractions. The tumor bed in the upper-outer quadrant of the left breast with boosted further to a cumulative dose of 62.4 gray. Treatments were completed on 01/27/2006. Patient received 5 years of adjuvant tamoxifen.  Narrative:  The patient returns today to further discuss radiation therapy. The patient's initial consultation was on 12/16/16. Since the patient's consultation, she presented to genetic counseling on 12/31/16 and she consented to testing given her family history of prostate cancer and personal history of breast cancer. Testing was normal and did not reveal a gene mutation. The patient has completed 3 cycles of Carboplatin/Paclitaxel and is scheduled for her 4th cycle tomorrow.  On review of systems: The patient reports her weight is stable. She denies nausea, emesis, or pain. The patient reports urinary frequency since she started chemotherapy. She reports having constipation the day of chemotherapy.  ALLERGIES:  is allergic to calan [verapamil]; effexor [venlafaxine]; erythromycin; femara [letrozole]; fosamax [alendronate]; ibuprofen; paxil [paroxetine hcl]; remeron [mirtazapine]; tamoxifen; and vasotec [enalapril].  Meds: Current Outpatient Prescriptions  Medication Sig Dispense Refill  . acetaminophen (TYLENOL) 500 MG tablet Take 500 mg by mouth daily as needed for moderate pain or headache.    Marland Kitchen  aspirin 81 MG tablet Take 81 mg by mouth daily.      . Calcium Carbonate-Vitamin D (CALCIUM 600 + D PO) Take 1 tablet by mouth 2 (two) times daily.     . Cholecalciferol (VITAMIN D) 2000 units CAPS Take 2,000 Units by mouth every other day.    Marland Kitchen dexamethasone (DECADRON) 4 MG tablet Take 5 tablets (20mg ) with food 12 hours and 6 hours before taxol. 30 tablet 0  . levothyroxine (SYNTHROID, LEVOTHROID) 88 MCG tablet Take 1 tablet (88 mcg total) by mouth daily before breakfast. (Patient taking differently: Take 44 mcg by mouth daily before breakfast. Take 5mcg pill M, W ,F and Sat) 90 tablet 1  . loratadine-pseudoephedrine (CLARITIN-D 24 HOUR) 10-240 MG 24 hr tablet Take 1 tablet by mouth daily as needed for allergies.    . metFORMIN (GLUCOPHAGE-XR) 500 MG 24 hr tablet TAKE ONE TABLET BY MOUTH WITH BREAKFAST AND LUNCH AND TAKE TWO TABLETS BY MOUTH WITH SUPPER FOR DAIBETES. (Patient taking differently: takes one tablet with supper) 360 tablet 1  . Multiple Vitamins-Minerals (MULTIVITAMIN PO) Take 1 tablet by mouth daily.     . pravastatin (PRAVACHOL) 40 MG tablet Take 10 mg by mouth every evening.     . triamterene-hydrochlorothiazide (MAXZIDE) 75-50 MG tablet TAKE ONE TABLET BY MOUTH ONCE DAILY FOR BLOOD PRESSURE AND  FLUID (Patient taking differently: TAKE 1/2 TABLET BY MOUTH ONCE DAILY FOR BLOOD PRESSURE AND  FLUID) 90 tablet 3   No current facility-administered medications for this encounter.     Physical Findings: The patient is in no acute distress. Patient is alert and oriented.  height is 5' 2.5" (1.588 m) and weight is 146 lb (66.2 kg). Her  oral temperature is 98.3 F (36.8 C). Her blood pressure is 142/79 (abnormal) and her pulse is 102 (abnormal). Her oxygen saturation is 100%.   Lungs are clear to auscultation bilaterally. Heart has regular rate and rhythm. No palpable cervical, supraclavicular, or axillary adenopathy. Abdomen soft, non-tender, normal bowel sounds. Pelvic exam deferred in  light of my recent exam.  Lab Findings: Lab Results  Component Value Date   WBC 9.8 01/12/2017   HGB 12.2 01/12/2017   HCT 36.6 01/12/2017   MCV 78.8 (L) 01/12/2017   PLT 329 01/12/2017    Radiographic Findings: No results found.  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 I have recommended post operative adjuvant raidation therapy. ASTRO guidelines do recommend external beam radiation therapy for Stage II patients with high grade lesions that are deeply invasive. My inclination is to recommend both external beam and vaginal brachytherapy given the findings of lymphovascular space invasion, deeply invasive tumor, high grade lesion, and cervical stromal involvement. I discussed this issue with Dr. Denman George who agreed with the recommendations for external beam radiation therapy as well as vaginal brachytherapy. The patient has completed 3 cycles of chemotherapy and will now proceed with external beam radiation therapy. I discussed the course of treatment, side effects, and potential toxicities of external beam and vaginal brachytherapy with the patient and her husband. The patient signed a consent form and a copy was placed in her medical chart.  Plan: The patient is scheduled for her 4th cycle of chemotherapy tomorrow morning. The patient's son is flying in to be with her for her fourth cycle of chemotherapy. I would not recommend external beam radiation therapy with full dose chemotherapy. I will speak with Dr. Alvy Bimler to consider  proceeding with external beam radiation therapy after the patient's fourth cycle or proceeding with this radiation treatment after she is completed all 6 cycles of adjuvant chemotherapy.  ____________________________________ -----------------------------------  Blair Promise, PhD, MD  This document serves as a record  of services personally performed by [name], MD. It was created on his behalf by Darcus Austin, a trained medical scribe. The creation of this record is based on the scribe's personal observations and the provider's statements to them. This document has been checked and approved by the attending provider.

## 2017-02-01 NOTE — Progress Notes (Signed)
Please see the Nurse Progress Note in the MD Initial Consult Encounter for this patient. 

## 2017-02-02 ENCOUNTER — Ambulatory Visit
Admission: RE | Admit: 2017-02-02 | Discharge: 2017-02-02 | Disposition: A | Discharge status: Home / Self Care | Payer: Medicare Other | Source: Ambulatory Visit | Attending: Radiation Oncology | Admitting: Radiation Oncology

## 2017-02-02 ENCOUNTER — Encounter: Payer: Self-pay | Admitting: Hematology and Oncology

## 2017-02-02 ENCOUNTER — Other Ambulatory Visit (HOSPITAL_BASED_OUTPATIENT_CLINIC_OR_DEPARTMENT_OTHER): Payer: Medicare Other

## 2017-02-02 ENCOUNTER — Ambulatory Visit (HOSPITAL_BASED_OUTPATIENT_CLINIC_OR_DEPARTMENT_OTHER): Payer: Medicare Other | Admitting: Hematology and Oncology

## 2017-02-02 ENCOUNTER — Ambulatory Visit: Payer: Medicare Other

## 2017-02-02 ENCOUNTER — Encounter: Payer: Self-pay | Admitting: Radiation Oncology

## 2017-02-02 DIAGNOSIS — C541 Malignant neoplasm of endometrium: Secondary | ICD-10-CM

## 2017-02-02 DIAGNOSIS — Z853 Personal history of malignant neoplasm of breast: Secondary | ICD-10-CM

## 2017-02-02 DIAGNOSIS — Z510 Encounter for antineoplastic radiation therapy: Secondary | ICD-10-CM | POA: Diagnosis not present

## 2017-02-02 HISTORY — DX: Type 2 diabetes mellitus without complications: E11.9

## 2017-02-02 LAB — CBC WITH DIFFERENTIAL/PLATELET
BASO%: 0.5 % (ref 0.0–2.0)
BASOS ABS: 0 10*3/uL (ref 0.0–0.1)
EOS ABS: 0 10*3/uL (ref 0.0–0.5)
EOS%: 0 % (ref 0.0–7.0)
HCT: 39.1 % (ref 34.8–46.6)
HGB: 13 g/dL (ref 11.6–15.9)
LYMPH%: 9.2 % — AB (ref 14.0–49.7)
MCH: 26.2 pg (ref 25.1–34.0)
MCHC: 33.3 g/dL (ref 31.5–36.0)
MCV: 78.7 fL — AB (ref 79.5–101.0)
MONO#: 0 10*3/uL — AB (ref 0.1–0.9)
MONO%: 0.4 % (ref 0.0–14.0)
NEUT#: 6.4 10*3/uL (ref 1.5–6.5)
NEUT%: 89.9 % — AB (ref 38.4–76.8)
PLATELETS: 311 10*3/uL (ref 145–400)
RBC: 4.96 10*6/uL (ref 3.70–5.45)
RDW: 17 % — ABNORMAL HIGH (ref 11.2–14.5)
WBC: 7.1 10*3/uL (ref 3.9–10.3)
lymph#: 0.6 10*3/uL — ABNORMAL LOW (ref 0.9–3.3)

## 2017-02-02 LAB — COMPREHENSIVE METABOLIC PANEL
ALT: 13 U/L (ref 0–55)
ANION GAP: 14 meq/L — AB (ref 3–11)
AST: 16 U/L (ref 5–34)
Albumin: 4.2 g/dL (ref 3.5–5.0)
Alkaline Phosphatase: 101 U/L (ref 40–150)
BILIRUBIN TOTAL: 0.44 mg/dL (ref 0.20–1.20)
BUN: 12.7 mg/dL (ref 7.0–26.0)
CHLORIDE: 101 meq/L (ref 98–109)
CO2: 22 meq/L (ref 22–29)
Calcium: 10.3 mg/dL (ref 8.4–10.4)
Creatinine: 0.8 mg/dL (ref 0.6–1.1)
EGFR: 83 mL/min/{1.73_m2} — AB (ref >90)
Glucose: 210 mg/dl — ABNORMAL HIGH (ref 70–140)
POTASSIUM: 3.7 meq/L (ref 3.5–5.1)
Sodium: 138 mEq/L (ref 136–145)
TOTAL PROTEIN: 7.7 g/dL (ref 6.4–8.3)

## 2017-02-02 NOTE — Assessment & Plan Note (Signed)
I have a long discussion with the patient and her son and also discussed this further with Dr. Sondra Come. Our final decision would be to hold chemotherapy to allow her to pursue pelvic irradiation before resuming chemotherapy. She is doing very well with no side effects from recent treatment. I will cancel her infusion today and the next treatment dated on 02/23/2017.  I will see her back on 03/16/2017

## 2017-02-02 NOTE — Progress Notes (Signed)
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 SEE COMMENT.  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 The greatest extent of invasive carcinoma as measured on the needle core biopsy measures 0.6 cm.         Endometrial cancer ([place])   02/03/2002 Pathology Results    1. BENIGN ENDOMETRIAL POLYPS.  2. UTERINE FIBROIDS: PORTIONS OF SMOOTH MUSCLE CONSISTENT WITH BENIGN LEIOMYOMA(S). PORTIONS OF BENIGN, CYSTICALLY ATROPHIC ENDOMETRIUM WITHOUT HYPERPLASIA OR EVIDENCE OF MALIGNANCY. 3. ENDOMETRIAL CURETTAGE: BENIGN ENDOMETRIUM AND SMOOTH MUSCLE.      09/07/2016 Pathology Results    PAP smear positive for malignant cells      09/07/2016 Initial Diagnosis    Patient had postmenopausal bleeding in 08-2016. She had PAP 09-07-16 by PCP Dr [name], which documented carcinoma. She had evaluation by Dr [name] with colposcopy/ ECC/endometrial biopsy documenting serous endometrioid endometrial carcinoma.      09/15/2016 Imaging    Ct imaging showed markedly thickened and heterogeneous endometrial stripe suspicious for endometrial carcinoma in this patient with postmenopausal vaginal bleeding. Cystic lesion in the right ovary cannot be definitively characterized. Pelvic ultrasound is recommended for further evaluation. Aortoiliac atherosclerosis. Avascular necrosis of the  femoral heads bilaterally.      10/07/2016 Tumor Marker    Patient's tumor was tested for the following markers: CA125 Results of the tumor marker test revealed 15.1      10/13/2016 Pathology Results    1. Lymph node, sentinel, biopsy, right obturator - THERE IS NO EVIDENCE OF CARCINOMA IN 8 OF 8 LYMPH NODES (0/8). - SEE COMMENT. 2. Lymph node, sentinel, biopsy, left obturator - THERE IS NO EVIDENCE OF CARCINOMA IN 1 OF 1 LYMPH NODE (0/1). - SEE COMMENT. 3. Lymph node, sentinel, biopsy, left para-aortic - THERE IS NO EVIDENCE OF CARCINOMA IN 4 OF 4 LYMPH NODES (0/4). - SEE COMMENT. 4. Uterus +/- tubes/ovaries, neoplastic, cervix - INVASIVE MIXED SEROUS/ENDOMETRIOID ADENOCARCINOMA, MULTIPLE FOCI, FIGO GRADE III, THE LARGEST FOCUS SPANS 8.2 CM. - ADENOCARCINOMA EXTENDS INTO THE OUTER HALF OF THE MYOMETRIUM AND INVOLVES THE STROMA OF THE LOWER UTERINE SEGMENT AND CERVIX. - LYMPHOVASCULAR INVASION IS IDENTIFIED. - THE SURGICAL RESECTION MARGINS ARE NEGATIVE FOR CARCINOMA. - SEE ONCOLOGY TABLE BELOW. ADDITIONAL FINDINGS: - ENDOMETRIUM: ENDOMETRIOID TYPE POLYP(S), WHICH CONTAIN FOCI OF SIMILAR APPEARING MIXED SEROUS/ENDOMETRIOID ADENOCARCINOMA. - MYOMETRIUM: LEIOMYOMATA. - SEROSA: UNREMARKABLE. - BILATERAL ADNEXA: BENIGN OVARIES AND FALLOPIAN TUBES.      10/13/2016 Surgery    Surgery: Total robotic hysterectomy bilateral salpingo-oophorectomy, bilateral pelvic sentinel lymph node removal, left PA sentinel lymph node removal. Mini-laparotomy for specimen removal Surgeons:  [name], MD; [name], MD  Pathology:  1)Uterus, cervix, bilateral tubes and ovaries 2) Right obturator SLN 3) Left Obturator SLN 4) Left PA SLN Operative findings: 14 week fibroid uterus with one large dominant 6 cm myoma that was palpably calcified. 4 cm right ovarian cyst. + SLN identified in the bilateral obturator spaces, + SLN identified in the left  PA space.       12/01/2016 -   Chemotherapy    She received carboplatin & Taxol      01/11/2017 Genetic Testing    Testing was normal and did not reveal a mutation in these genes: The genes tested were the 80 genes on [facility]'s Multi-Cancer panel (ALK, APC, ATM, AXIN2, BAP1, BARD1, BLM, BMPR1A, BRCA1, BRCA2, BRIP1, CASR, CDC73, CDH1, CDK4, CDKN1B, CDKN1C, CDKN2A, CEBPA, CHEK2, DICER1, DIS3L2, EGFR, EPCAM, FH, FLCN, GATA2, GPC3, GREM1, HOXB13,  HRAS, KIT, MAX, MEN1, MET, MITF, MLH1, MSH2, MSH6, MUTYH, NBN, NF1, NF2, PALB2, PDGFRA, PHOX2B, PMS2, POLD1, POLE, POT1, PRKAR1A, PTCH1, PTEN, RAD50, RAD51C, RAD51D, RB1, RECQL4, RET, RUNX1, SDHA, SDHAF2, SDHB, SDHC, SDHD, SMAD4, SMARCA4, SMARCB1, SMARCE1, STK11, SUFU, TERC, TERT, TMEM127, TP53, TSC1, TSC2, VHL, WRN, and WT1).       INTERVAL HISTORY: Please see below for problem oriented charting. She returns today with her son prior to cycle 4 of treatment. She has an appointment and saw the radiation oncologist yesterday.  She is interested to pursue radiation treatment She denies side effects from recent treatment.  Denies peripheral neuropathy  REVIEW OF SYSTEMS:   Constitutional: Denies fevers, chills or abnormal weight loss Eyes: Denies blurriness of vision Ears, nose, mouth, throat, and face: Denies mucositis or sore throat Respiratory: Denies cough, dyspnea or wheezes Cardiovascular: Denies palpitation, chest discomfort or lower extremity swelling Gastrointestinal:  Denies nausea, heartburn or change in bowel habits Skin: Denies abnormal skin rashes Lymphatics: Denies new lymphadenopathy or easy bruising Neurological:Denies numbness, tingling or new weaknesses Behavioral/Psych: Mood is stable, no new changes  All other systems were reviewed with the patient and are negative.  I have reviewed the past medical history, past surgical history, social history and family history with the patient and they are unchanged from previous note.  ALLERGIES:  is allergic to calan  [verapamil]; effexor [venlafaxine]; erythromycin; femara [letrozole]; fosamax [alendronate]; ibuprofen; paxil [paroxetine hcl]; remeron [mirtazapine]; tamoxifen; and vasotec [enalapril].  MEDICATIONS:  Current Outpatient Prescriptions  Medication Sig Dispense Refill  . aspirin 81 MG tablet Take 81 mg by mouth daily.      . Calcium Carbonate-Vitamin D (CALCIUM 600 + D PO) Take 1 tablet by mouth 2 (two) times daily.     . Cholecalciferol (VITAMIN D) 2000 units CAPS Take 2,000 Units by mouth every other day.    Marland Kitchen dexamethasone (DECADRON) 4 MG tablet Take 5 tablets (37m) with food 12 hours and 6 hours before taxol. 30 tablet 0  . levothyroxine (SYNTHROID, LEVOTHROID) 88 MCG tablet Take 1 tablet (88 mcg total) by mouth daily before breakfast. (Patient taking differently: Take 44 mcg by mouth daily before breakfast. Take 421m pill M, W ,F and Sat) 90 tablet 1  . loratadine-pseudoephedrine (CLARITIN-D 24 HOUR) 10-240 MG 24 hr tablet Take 1 tablet by mouth daily as needed for allergies.    . metFORMIN (GLUCOPHAGE-XR) 500 MG 24 hr tablet TAKE ONE TABLET BY MOUTH WITH BREAKFAST AND LUNCH AND TAKE TWO TABLETS BY MOUTH WITH SUPPER FOR DAIBETES. (Patient taking differently: takes one tablet with supper) 360 tablet 1  . Multiple Vitamins-Minerals (MULTIVITAMIN PO) Take 1 tablet by mouth daily.     . pravastatin (PRAVACHOL) 40 MG tablet Take 10 mg by mouth every evening.     . triamterene-hydrochlorothiazide (MAXZIDE) 75-50 MG tablet TAKE ONE TABLET BY MOUTH ONCE DAILY FOR BLOOD PRESSURE AND  FLUID (Patient taking differently: TAKE 1/2 TABLET BY MOUTH ONCE DAILY FOR BLOOD PRESSURE AND  FLUID) 90 tablet  3  . acetaminophen (TYLENOL) 500 MG tablet Take 500 mg by mouth daily as needed for moderate pain or headache.     No current facility-administered medications for this visit.     PHYSICAL EXAMINATION: ECOG PERFORMANCE STATUS: 1 - Symptomatic but completely ambulatory  Vitals:   02/02/17 0825  BP: (!)  162/76  Pulse: (!) 105  Resp: 18  Temp: 98.3 F (36.8 C)   Filed Weights   02/02/17 0825  Weight: 147 lb 1.6 oz (66.7 kg)    GENERAL:alert, no distress and comfortable SKIN: skin color, texture, turgor are normal, no rashes or significant lesions EYES: normal, Conjunctiva are pink and non-injected, sclera clear Musculoskeletal:no cyanosis of digits and no clubbing  NEURO: alert & oriented x 3 with fluent speech, no focal motor/sensory deficits  LABORATORY DATA:  I have reviewed the data as listed    Component Value Date/Time   NA 138 02/02/2017 0759   K 3.7 02/02/2017 0759   CL 98 (L) 10/14/2016 0444   CL 99 12/20/2012 1336   CO2 22 02/02/2017 0759   GLUCOSE 210 (H) 02/02/2017 0759   GLUCOSE 110 (H) 12/20/2012 1336   BUN 12.7 02/02/2017 0759   CREATININE 0.8 02/02/2017 0759   CALCIUM 10.3 02/02/2017 0759   PROT 7.7 02/02/2017 0759   ALBUMIN 4.2 02/02/2017 0759   AST 16 02/02/2017 0759   ALT 13 02/02/2017 0759   ALKPHOS 101 02/02/2017 0759   BILITOT 0.44 02/02/2017 0759   GFRNONAA >60 10/14/2016 0444   GFRNONAA 85 07/09/2016 0950   GFRAA >60 10/14/2016 0444   GFRAA >89 07/09/2016 0950    No results found for: SPEP, UPEP  Lab Results  Component Value Date   WBC 7.1 02/02/2017   NEUTROABS 6.4 02/02/2017   HGB 13.0 02/02/2017   HCT 39.1 02/02/2017   MCV 78.7 (L) 02/02/2017   PLT 311 02/02/2017      Chemistry      Component Value Date/Time   NA 138 02/02/2017 0759   K 3.7 02/02/2017 0759   CL 98 (L) 10/14/2016 0444   CL 99 12/20/2012 1336   CO2 22 02/02/2017 0759   BUN 12.7 02/02/2017 0759   CREATININE 0.8 02/02/2017 0759      Component Value Date/Time   CALCIUM 10.3 02/02/2017 0759   ALKPHOS 101 02/02/2017 0759   AST 16 02/02/2017 0759   ALT 13 02/02/2017 0759   BILITOT 0.44 02/02/2017 0759     ASSESSMENT & PLAN:  Endometrial cancer ([place]) I have a long discussion with the patient and her son and also discussed this further with Dr. Sondra Come. Our  final decision would be to hold chemotherapy to allow her to pursue pelvic irradiation before resuming chemotherapy. She is doing very well with no side effects from recent treatment. I will cancel her infusion today and the next treatment dated on 02/23/2017.  I will see her back on 03/16/2017   No orders of the defined types were placed in this encounter.  All questions were answered. The patient knows to call the clinic with any problems, questions or concerns. No barriers to learning was detected. I spent 15 minutes counseling the patient face to face. The total time spent in the appointment was 20 minutes and more than 50% was on counseling and review of test results     [name], MD 02/02/2017 9:28 AM

## 2017-02-04 NOTE — Progress Notes (Signed)
  Radiation Oncology         [phone] ________________________________  Name: Tammy Hensley MRN: [id number]  Date: 02/02/2017  DOB: Mar 18, 1941  SIMULATION AND TREATMENT PLANNING NOTE    ICD-9-CM ICD-10-CM   1. Endometrial cancer (HCC) 182.0 C54.1   2. 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  Identity was confirmed.  All relevant records and images related to the planned course of therapy were reviewed.  The patient freely provided informed written consent to proceed with treatment after reviewing the details related to the planned course of therapy. The consent form was witnessed and verified by the simulation staff.  Then, the patient was set-up in a stable reproducible  supine position for radiation therapy.  CT images were obtained.  Surface markings were placed.  The CT images were loaded into the planning software.  Then the target and avoidance structures were contoured.  Treatment planning then occurred.  The radiation prescription was entered and confirmed.  Then, I designed and supervised the construction of a total of 2 medically necessary complex treatment devices.  I have requested : Intensity Modulated Radiotherapy (IMRT) is medically necessary for this case for the following reason:  Small bowel sparing..  I have ordered:CBC  PLAN:  The patient will receive 45 Gy in 25 fractions Directed at the pelvis area. The patient will then proceed with 3 intracavitary brachytherapy treatments directed at the vaginal cuff region. Iridium 192 will be the high-dose-rate source.  -----------------------------------  Blair Promise, PhD, MD

## 2017-02-08 DIAGNOSIS — Z510 Encounter for antineoplastic radiation therapy: Secondary | ICD-10-CM | POA: Diagnosis not present

## 2017-02-10 ENCOUNTER — Ambulatory Visit
Admission: RE | Admit: 2017-02-10 | Discharge: 2017-02-10 | Disposition: A | Discharge status: Home / Self Care | Payer: Medicare Other | Source: Ambulatory Visit | Attending: Radiation Oncology | Admitting: Radiation Oncology

## 2017-02-10 DIAGNOSIS — C541 Malignant neoplasm of endometrium: Secondary | ICD-10-CM

## 2017-02-10 DIAGNOSIS — Z510 Encounter for antineoplastic radiation therapy: Secondary | ICD-10-CM | POA: Diagnosis not present

## 2017-02-10 NOTE — Progress Notes (Signed)
  Radiation Oncology         [phone] ________________________________  Name: Tammy Hensley MRN: [id number]  Date: 02/10/2017  DOB: 10/27/1941  Simulation Verification Note    ICD-9-CM ICD-10-CM   1. Endometrial cancer ([place]) 182.0 C54.1     Status: outpatient  NARRATIVE: The patient was brought to the treatment unit and placed in the planned treatment position. The clinical setup was verified. Then port films were obtained and uploaded to the radiation oncology medical record software.  The treatment beams were carefully compared against the planned radiation fields. The position location and shape of the radiation fields was reviewed. They targeted volume of tissue appears to be appropriately covered by the radiation beams. Organs at risk appear to be excluded as planned.  Based on my personal review, I approved the simulation verification. The patient's treatment will proceed as planned.  -----------------------------------  Blair Promise, PhD, MD

## 2017-02-11 ENCOUNTER — Encounter (INDEPENDENT_AMBULATORY_CARE_PROVIDER_SITE_OTHER): Payer: Self-pay

## 2017-02-11 ENCOUNTER — Ambulatory Visit
Admission: RE | Admit: 2017-02-11 | Discharge: 2017-02-11 | Disposition: A | Discharge status: Home / Self Care | Payer: Medicare Other | Source: Ambulatory Visit | Attending: Radiation Oncology | Admitting: Radiation Oncology

## 2017-02-11 DIAGNOSIS — Z510 Encounter for antineoplastic radiation therapy: Secondary | ICD-10-CM | POA: Diagnosis not present

## 2017-02-12 ENCOUNTER — Ambulatory Visit
Admission: RE | Admit: 2017-02-12 | Discharge: 2017-02-12 | Disposition: A | Discharge status: Home / Self Care | Payer: Medicare Other | Source: Ambulatory Visit | Attending: Radiation Oncology | Admitting: Radiation Oncology

## 2017-02-12 DIAGNOSIS — Z510 Encounter for antineoplastic radiation therapy: Secondary | ICD-10-CM | POA: Diagnosis not present

## 2017-02-12 DIAGNOSIS — C541 Malignant neoplasm of endometrium: Secondary | ICD-10-CM

## 2017-02-12 NOTE — Progress Notes (Signed)
Pt here for patient teaching.  Pt given Radiation and You booklet and Managing Acute Radiation Side Effects for Head and Neck Cancer handout.  Reviewed areas of pertinence such as diarrhea, fatigue, nausea and vomiting, skin changes and urinary and bladder changes . Pt able to give teach back of to pat skin, use unscented/gentle soap, have Imodium on hand and drink plenty of water,avoid applying anything to skin within 4 hours of treatment. Pt demonstrated understanding and verbalizes understanding of information given and will contact nursing with any questions or concerns.

## 2017-02-15 ENCOUNTER — Ambulatory Visit
Admission: RE | Admit: 2017-02-15 | Discharge: 2017-02-15 | Disposition: A | Discharge status: Home / Self Care | Payer: Medicare Other | Source: Ambulatory Visit | Attending: Radiation Oncology | Admitting: Radiation Oncology

## 2017-02-15 ENCOUNTER — Ambulatory Visit: Payer: Medicare Other | Admitting: Radiation Oncology

## 2017-02-15 DIAGNOSIS — Z510 Encounter for antineoplastic radiation therapy: Secondary | ICD-10-CM | POA: Diagnosis not present

## 2017-02-16 ENCOUNTER — Encounter: Payer: Self-pay | Admitting: Radiation Oncology

## 2017-02-16 ENCOUNTER — Ambulatory Visit
Admission: RE | Admit: 2017-02-16 | Discharge: 2017-02-16 | Disposition: A | Discharge status: Home / Self Care | Payer: Medicare Other | Source: Ambulatory Visit | Attending: Radiation Oncology | Admitting: Radiation Oncology

## 2017-02-16 VITALS — BP 150/85 | HR 97 | Temp 98.2°F | Ht 62.5 in | Wt 144.2 lb

## 2017-02-16 DIAGNOSIS — C541 Malignant neoplasm of endometrium: Secondary | ICD-10-CM

## 2017-02-16 DIAGNOSIS — Z510 Encounter for antineoplastic radiation therapy: Secondary | ICD-10-CM | POA: Diagnosis not present

## 2017-02-16 NOTE — Progress Notes (Signed)
  Radiation Oncology         [phone] ________________________________  Name: Tammy Hensley MRN: [id number]  Date: 02/16/2017  DOB: [birth year]  Weekly Radiation Therapy Management    ICD-9-CM ICD-10-CM   1. FIGO stage II endometrial cancer (HCC) 182.0 C54.1      Current Dose: 9 Gy     Planned Dose:  45+ Gy  Narrative . . . . . . . . The patient presents for routine under treatment assessment.                                    [name] has completed 5 fractions to her pelvis.  She denies having pain. She reports having severe fatigue on Saturday and said she can't remember what happened. She also had nausea. She said she took Zofran an hour before treatment yesterday and today and has not had any nausea.  She denies having any bladder/bowel changes or vaginal bleeding.                                  Set-up films were reviewed.                                 The chart was checked. Physical Findings. . .  height is 5' 2.5" (1.588 m) and weight is 144 lb 3.2 oz (65.4 kg). Her oral temperature is 98.2 F (36.8 C). Her blood pressure is 150/85 (abnormal) and her pulse is 97. Her oxygen saturation is 100%.   Lungs are clear to auscultation bilaterally. Heart has regular rate and rhythm. Abdomen soft, non-tender, normal bowel sounds. Impression . . . . . . . The patient is tolerating radiation. Plan . . . . . . . . . . . . Continue treatment as planned. ________________________________   Blair Promise, PhD, MD  This document serves as a record of services personally performed by [name], MD. It was created on his behalf by Darcus Austin, a trained medical scribe. The creation of this record is based on the scribe's personal observations and the provider's statements to them. This document has been checked and approved by the attending provider.

## 2017-02-16 NOTE — Progress Notes (Signed)
Tammy Hensley has completed 5 fractions to her pelvis.  She denies having pain.  She reports having severe fatigue on Saturday and said she can't remember what happened.  She also had nausea.  She said she took Zofran an hour before treatment yesterday and today and has not had any nausea.  She denies having any bladder/bowel changes or vaginal bleeding.  BP (!) 150/85 (BP Location: Right Arm, Patient Position: Sitting)   Pulse 97   Temp 98.2 F (36.8 C) (Oral)   Ht 5' 2.5" (1.588 m)   Wt 144 lb 3.2 oz (65.4 kg)   SpO2 100%   BMI 25.95 kg/m  Wt Readings from Last 3 Encounters:  02/16/17 144 lb 3.2 oz (65.4 kg)  02/02/17 147 lb 1.6 oz (66.7 kg)  02/01/17 146 lb (66.2 kg)

## 2017-02-17 ENCOUNTER — Ambulatory Visit
Admission: RE | Admit: 2017-02-17 | Discharge: 2017-02-17 | Disposition: A | Discharge status: Home / Self Care | Payer: Medicare Other | Source: Ambulatory Visit | Attending: Radiation Oncology | Admitting: Radiation Oncology

## 2017-02-17 DIAGNOSIS — Z510 Encounter for antineoplastic radiation therapy: Secondary | ICD-10-CM | POA: Diagnosis not present

## 2017-02-18 ENCOUNTER — Ambulatory Visit
Admission: RE | Admit: 2017-02-18 | Discharge: 2017-02-18 | Disposition: A | Discharge status: Home / Self Care | Payer: Medicare Other | Source: Ambulatory Visit | Attending: Radiation Oncology | Admitting: Radiation Oncology

## 2017-02-18 DIAGNOSIS — Z510 Encounter for antineoplastic radiation therapy: Secondary | ICD-10-CM | POA: Diagnosis not present

## 2017-02-19 ENCOUNTER — Ambulatory Visit
Admission: RE | Admit: 2017-02-19 | Discharge: 2017-02-19 | Disposition: A | Discharge status: Home / Self Care | Payer: Medicare Other | Source: Ambulatory Visit | Attending: Radiation Oncology | Admitting: Radiation Oncology

## 2017-02-19 DIAGNOSIS — Z510 Encounter for antineoplastic radiation therapy: Secondary | ICD-10-CM | POA: Diagnosis not present

## 2017-02-22 ENCOUNTER — Other Ambulatory Visit: Payer: Medicare Other

## 2017-02-22 ENCOUNTER — Ambulatory Visit
Admission: RE | Admit: 2017-02-22 | Discharge: 2017-02-22 | Disposition: A | Discharge status: Home / Self Care | Payer: Medicare Other | Source: Ambulatory Visit | Attending: Radiation Oncology | Admitting: Radiation Oncology

## 2017-02-22 DIAGNOSIS — Z510 Encounter for antineoplastic radiation therapy: Secondary | ICD-10-CM | POA: Diagnosis not present

## 2017-02-23 ENCOUNTER — Ambulatory Visit
Admission: RE | Admit: 2017-02-23 | Discharge: 2017-02-23 | Disposition: A | Discharge status: Home / Self Care | Payer: Medicare Other | Source: Ambulatory Visit | Attending: Radiation Oncology | Admitting: Radiation Oncology

## 2017-02-23 ENCOUNTER — Ambulatory Visit: Payer: Medicare Other | Admitting: Hematology and Oncology

## 2017-02-23 ENCOUNTER — Other Ambulatory Visit: Payer: Medicare Other

## 2017-02-23 ENCOUNTER — Ambulatory Visit: Payer: Medicare Other

## 2017-02-23 ENCOUNTER — Encounter: Payer: Self-pay | Admitting: Radiation Oncology

## 2017-02-23 VITALS — BP 135/78 | HR 83 | Temp 98.5°F | Ht 62.5 in | Wt 145.2 lb

## 2017-02-23 DIAGNOSIS — C541 Malignant neoplasm of endometrium: Secondary | ICD-10-CM

## 2017-02-23 DIAGNOSIS — Z510 Encounter for antineoplastic radiation therapy: Secondary | ICD-10-CM | POA: Diagnosis not present

## 2017-02-23 MED ORDER — LORAZEPAM 0.5 MG PO TABS: 0.5000 mg | ORAL_TABLET | Freq: Four times a day (QID) | ORAL | 0 refills | Status: DC | PRN

## 2017-02-23 NOTE — Progress Notes (Signed)
  Radiation Oncology         [phone] ________________________________  Name: Tammy Hensley MRN: [id number]  Date: 02/23/2017  DOB: 21-Dec-1940  Weekly Radiation Therapy Management    ICD-9-CM ICD-10-CM   1. 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 . . . . . . . The patient presents for routine under treatment assessment.                                    Tammy Hensley has completed 10 fractions to her pelvis.  She denies having pain.  She has noticed a decrease in her appetite and has noticed that her clothes are loose. The patient's weight is stable.  She denies having nausea and is taking 0.5 mg of ativan before treatments.  She will need a refill of Ativan soon.  She reports having softer stools now and is taking 0.5 tablet of Imodium as needed.  She denies having any bladder changes, vaginal bleeding, or skin irritation.  She reports feeling fatigued. She is worried about not drinking enough water.                                  Set-up films were reviewed.                                 The chart was checked. Physical Findings. . .  height is 5' 2.5" (1.588 m) and weight is 145 lb 3.2 oz (65.9 kg). Her oral temperature is 98.5 F (36.9 C). Her blood pressure is 135/78 and her pulse is 83. Her oxygen saturation is 100%.   Lungs are clear to auscultation bilaterally. Heart has regular rate and rhythm. Abdomen soft, non-tender, normal bowel sounds. Impression . . . . . . . The patient is tolerating radiation. Plan . . . . . . . . . . . . Continue treatment as planned. A consultation with Nutrition was offered; the patient will defer at this time. Iterated the importance of hydration during treatment. The patient's Ativan was refilled. ________________________________   Blair Promise, PhD, MD  This document  serves as a record of services personally performed by Shona Simpson, PA-C and [name], PhD, MD. It was created on their behalf by Darcus Austin, a trained medical scribe. The creation of this record is based on the scribe's personal observations and the providers' statements to them. This document has been checked and approved by the attending provider.

## 2017-02-23 NOTE — Progress Notes (Signed)
Tammy Hensley has completed 10 fractions to her pelvis.  She denies having pain.  She has noticed a decrease in her appetite and has noticed that her clothes are loose.  She denies having nausea and is taking 0.5 mg of ativan before treatments.  She will need a refill on Ativan soon.  She reports having softer stools now and is taking 0.5 tablet of Imodium as needed.  She denies having any bladder changes, vaginal bleeding or skin irritation.  She reports feeling fatigued.  BP 135/78 (BP Location: Right Arm, Patient Position: Sitting)   Pulse 83   Temp 98.5 F (36.9 C) (Oral)   Ht 5' 2.5" (1.588 m)   Wt 145 lb 3.2 oz (65.9 kg)   SpO2 100%   BMI 26.13 kg/m    Wt Readings from Last 3 Encounters:  02/23/17 145 lb 3.2 oz (65.9 kg)  02/16/17 144 lb 3.2 oz (65.4 kg)  02/02/17 147 lb 1.6 oz (66.7 kg)

## 2017-02-24 ENCOUNTER — Ambulatory Visit
Admission: RE | Admit: 2017-02-24 | Discharge: 2017-02-24 | Disposition: A | Discharge status: Home / Self Care | Payer: Medicare Other | Source: Ambulatory Visit | Attending: Radiation Oncology | Admitting: Radiation Oncology

## 2017-02-24 DIAGNOSIS — Z510 Encounter for antineoplastic radiation therapy: Secondary | ICD-10-CM | POA: Diagnosis not present

## 2017-02-25 ENCOUNTER — Ambulatory Visit
Admission: RE | Admit: 2017-02-25 | Discharge: 2017-02-25 | Disposition: A | Discharge status: Home / Self Care | Payer: Medicare Other | Source: Ambulatory Visit | Attending: Radiation Oncology | Admitting: Radiation Oncology

## 2017-02-25 DIAGNOSIS — Z510 Encounter for antineoplastic radiation therapy: Secondary | ICD-10-CM | POA: Diagnosis not present

## 2017-02-26 ENCOUNTER — Ambulatory Visit
Admission: RE | Admit: 2017-02-26 | Discharge: 2017-02-26 | Disposition: A | Discharge status: Home / Self Care | Payer: Medicare Other | Source: Ambulatory Visit | Attending: Radiation Oncology | Admitting: Radiation Oncology

## 2017-02-26 DIAGNOSIS — Z510 Encounter for antineoplastic radiation therapy: Secondary | ICD-10-CM | POA: Diagnosis not present

## 2017-03-01 ENCOUNTER — Ambulatory Visit
Admission: RE | Admit: 2017-03-01 | Discharge: 2017-03-01 | Disposition: A | Discharge status: Home / Self Care | Payer: Medicare Other | Source: Ambulatory Visit | Attending: Radiation Oncology | Admitting: Radiation Oncology

## 2017-03-01 DIAGNOSIS — Z510 Encounter for antineoplastic radiation therapy: Secondary | ICD-10-CM | POA: Diagnosis not present

## 2017-03-02 ENCOUNTER — Ambulatory Visit: Admission: RE | Admit: 2017-03-02 | Payer: Medicare Other | Source: Ambulatory Visit | Admitting: Radiation Oncology

## 2017-03-02 ENCOUNTER — Ambulatory Visit
Admission: RE | Admit: 2017-03-02 | Discharge: 2017-03-02 | Disposition: A | Discharge status: Home / Self Care | Payer: Medicare Other | Source: Ambulatory Visit | Attending: Radiation Oncology | Admitting: Radiation Oncology

## 2017-03-02 DIAGNOSIS — Z510 Encounter for antineoplastic radiation therapy: Secondary | ICD-10-CM | POA: Diagnosis not present

## 2017-03-03 ENCOUNTER — Telehealth: Payer: Self-pay | Admitting: *Deleted

## 2017-03-03 ENCOUNTER — Ambulatory Visit
Admission: RE | Admit: 2017-03-03 | Discharge: 2017-03-03 | Disposition: A | Discharge status: Home / Self Care | Payer: Medicare Other | Source: Ambulatory Visit | Attending: Radiation Oncology | Admitting: Radiation Oncology

## 2017-03-03 ENCOUNTER — Other Ambulatory Visit: Payer: Self-pay | Admitting: Hematology and Oncology

## 2017-03-03 DIAGNOSIS — Z510 Encounter for antineoplastic radiation therapy: Secondary | ICD-10-CM | POA: Diagnosis not present

## 2017-03-03 NOTE — Telephone Encounter (Signed)
LM to call Dr Gorsuch' nurse 

## 2017-03-03 NOTE — Telephone Encounter (Signed)
Notified of message below. States she does not have a port. OK to reschedule until end of May

## 2017-03-03 NOTE — Telephone Encounter (Signed)
-----   Message from [name], MD sent at 03/03/2017 10:02 AM EDT ----- Regarding: chemo change appt Please call her. 1) she needs port flush now. Can you ask what day is convenience to stop by and flush her port?  2) I noticed her last day of radiation is on 4/24. She also sees me with chemo that day. It is too soon to resume chemo after radiation. We typically wait for 1 month post radiation before we resume chemo. If OK with her, proceed to cancel her appt on 4/24. Let me know and I will place scheduling appt to resume Rx in May

## 2017-03-04 ENCOUNTER — Ambulatory Visit
Admission: RE | Admit: 2017-03-04 | Discharge: 2017-03-04 | Disposition: A | Discharge status: Home / Self Care | Payer: Medicare Other | Source: Ambulatory Visit | Attending: Radiation Oncology | Admitting: Radiation Oncology

## 2017-03-04 DIAGNOSIS — Z510 Encounter for antineoplastic radiation therapy: Secondary | ICD-10-CM | POA: Diagnosis not present

## 2017-03-05 ENCOUNTER — Ambulatory Visit
Admission: RE | Admit: 2017-03-05 | Discharge: 2017-03-05 | Disposition: A | Discharge status: Home / Self Care | Payer: Medicare Other | Source: Ambulatory Visit | Attending: Radiation Oncology | Admitting: Radiation Oncology

## 2017-03-05 DIAGNOSIS — Z510 Encounter for antineoplastic radiation therapy: Secondary | ICD-10-CM | POA: Diagnosis not present

## 2017-03-08 ENCOUNTER — Telehealth: Payer: Self-pay | Admitting: Hematology and Oncology

## 2017-03-08 ENCOUNTER — Ambulatory Visit
Admission: RE | Admit: 2017-03-08 | Discharge: 2017-03-08 | Disposition: A | Discharge status: Home / Self Care | Payer: Medicare Other | Source: Ambulatory Visit | Attending: Radiation Oncology | Admitting: Radiation Oncology

## 2017-03-08 DIAGNOSIS — Z510 Encounter for antineoplastic radiation therapy: Secondary | ICD-10-CM | POA: Diagnosis not present

## 2017-03-08 NOTE — Telephone Encounter (Signed)
Called patient to inform her of next scheduled appointments. LVM °

## 2017-03-09 ENCOUNTER — Ambulatory Visit
Admission: RE | Admit: 2017-03-09 | Discharge: 2017-03-09 | Disposition: A | Discharge status: Home / Self Care | Payer: Medicare Other | Source: Ambulatory Visit | Attending: Radiation Oncology | Admitting: Radiation Oncology

## 2017-03-09 DIAGNOSIS — Z510 Encounter for antineoplastic radiation therapy: Secondary | ICD-10-CM | POA: Diagnosis not present

## 2017-03-10 ENCOUNTER — Ambulatory Visit: Payer: Medicare Other

## 2017-03-10 ENCOUNTER — Ambulatory Visit
Admission: RE | Admit: 2017-03-10 | Discharge: 2017-03-10 | Disposition: A | Discharge status: Home / Self Care | Payer: Medicare Other | Source: Ambulatory Visit | Attending: Radiation Oncology | Admitting: Radiation Oncology

## 2017-03-10 DIAGNOSIS — Z510 Encounter for antineoplastic radiation therapy: Secondary | ICD-10-CM | POA: Diagnosis not present

## 2017-03-11 ENCOUNTER — Ambulatory Visit: Payer: Medicare Other

## 2017-03-11 ENCOUNTER — Ambulatory Visit
Admission: RE | Admit: 2017-03-11 | Discharge: 2017-03-11 | Disposition: A | Discharge status: Home / Self Care | Payer: Medicare Other | Source: Ambulatory Visit | Attending: Radiation Oncology | Admitting: Radiation Oncology

## 2017-03-11 DIAGNOSIS — Z510 Encounter for antineoplastic radiation therapy: Secondary | ICD-10-CM | POA: Diagnosis not present

## 2017-03-12 ENCOUNTER — Ambulatory Visit
Admission: RE | Admit: 2017-03-12 | Discharge: 2017-03-12 | Disposition: A | Discharge status: Home / Self Care | Payer: Medicare Other | Source: Ambulatory Visit | Attending: Radiation Oncology | Admitting: Radiation Oncology

## 2017-03-12 DIAGNOSIS — Z510 Encounter for antineoplastic radiation therapy: Secondary | ICD-10-CM | POA: Diagnosis not present

## 2017-03-15 ENCOUNTER — Ambulatory Visit
Admission: RE | Admit: 2017-03-15 | Discharge: 2017-03-15 | Disposition: A | Discharge status: Home / Self Care | Payer: Medicare Other | Source: Ambulatory Visit | Attending: Radiation Oncology | Admitting: Radiation Oncology

## 2017-03-15 ENCOUNTER — Other Ambulatory Visit: Payer: Medicare Other

## 2017-03-15 DIAGNOSIS — Z510 Encounter for antineoplastic radiation therapy: Secondary | ICD-10-CM | POA: Diagnosis not present

## 2017-03-16 ENCOUNTER — Ambulatory Visit: Payer: Medicare Other | Admitting: Hematology and Oncology

## 2017-03-16 ENCOUNTER — Ambulatory Visit: Payer: Medicare Other

## 2017-03-16 ENCOUNTER — Ambulatory Visit
Admission: RE | Admit: 2017-03-16 | Discharge: 2017-03-16 | Disposition: A | Discharge status: Home / Self Care | Payer: Medicare Other | Source: Ambulatory Visit | Attending: Radiation Oncology | Admitting: Radiation Oncology

## 2017-03-16 ENCOUNTER — Other Ambulatory Visit: Payer: Medicare Other

## 2017-03-16 DIAGNOSIS — Z510 Encounter for antineoplastic radiation therapy: Secondary | ICD-10-CM | POA: Diagnosis not present

## 2017-03-17 ENCOUNTER — Ambulatory Visit: Payer: Medicare Other

## 2017-03-18 ENCOUNTER — Ambulatory Visit: Payer: Medicare Other

## 2017-03-18 ENCOUNTER — Encounter: Payer: Self-pay | Admitting: Radiation Oncology

## 2017-03-19 ENCOUNTER — Ambulatory Visit: Payer: Medicare Other

## 2017-03-24 ENCOUNTER — Telehealth: Payer: Self-pay | Admitting: Oncology

## 2017-03-24 ENCOUNTER — Telehealth: Payer: Self-pay | Admitting: *Deleted

## 2017-03-24 NOTE — Telephone Encounter (Signed)
Notified ok to go to dentist

## 2017-03-24 NOTE — Telephone Encounter (Addendum)
Patient called and said she is having trouble with a tooth and is wondering if it is OK for her to see a dentist.  Advised her that, per Dr. Sondra Come, it would be OK from a radiation standpoint but that she needs to check with Dr. Calton Dach office as well due to having chemotherapy.  Robynn verbalized understanding and said she would contact Dr. Calton Dach office.

## 2017-03-24 NOTE — Telephone Encounter (Signed)
Proceed.

## 2017-03-24 NOTE — Telephone Encounter (Signed)
Pt states she has a toothache where she has a filling. Wants to know if she can go to dentist to get it taken care of before she resumes chemo. Currently in [facility]. Dr Sondra Come told her to check with Dr Alvy Bimler.

## 2017-03-26 ENCOUNTER — Telehealth: Payer: Self-pay | Admitting: *Deleted

## 2017-03-26 NOTE — Telephone Encounter (Signed)
Called patient to remind of [facility] [place] for 03-29-17, spoke with patient and she is aware of these appts.

## 2017-03-29 ENCOUNTER — Ambulatory Visit
Admission: RE | Admit: 2017-03-29 | Discharge: 2017-03-29 | Disposition: A | Discharge status: Home / Self Care | Payer: Medicare Other | Source: Ambulatory Visit | Attending: Radiation Oncology | Admitting: Radiation Oncology

## 2017-03-29 ENCOUNTER — Encounter: Payer: Self-pay | Admitting: Radiation Oncology

## 2017-03-29 DIAGNOSIS — Z510 Encounter for antineoplastic radiation therapy: Secondary | ICD-10-CM | POA: Diagnosis not present

## 2017-03-29 DIAGNOSIS — Z90710 Acquired absence of both cervix and uterus: Secondary | ICD-10-CM | POA: Insufficient documentation

## 2017-03-29 DIAGNOSIS — C541 Malignant neoplasm of endometrium: Secondary | ICD-10-CM

## 2017-03-29 DIAGNOSIS — Z853 Personal history of malignant neoplasm of breast: Secondary | ICD-10-CM | POA: Diagnosis not present

## 2017-03-29 DIAGNOSIS — Z7982 Long term (current) use of aspirin: Secondary | ICD-10-CM | POA: Diagnosis not present

## 2017-03-29 DIAGNOSIS — Z90722 Acquired absence of ovaries, bilateral: Secondary | ICD-10-CM | POA: Insufficient documentation

## 2017-03-29 DIAGNOSIS — Z79899 Other long term (current) drug therapy: Secondary | ICD-10-CM | POA: Insufficient documentation

## 2017-03-29 DIAGNOSIS — Z923 Personal history of irradiation: Secondary | ICD-10-CM | POA: Insufficient documentation

## 2017-03-29 DIAGNOSIS — Z888 Allergy status to other drugs, medicaments and biological substances status: Secondary | ICD-10-CM | POA: Diagnosis not present

## 2017-03-29 DIAGNOSIS — Z8042 Family history of malignant neoplasm of prostate: Secondary | ICD-10-CM | POA: Insufficient documentation

## 2017-03-29 DIAGNOSIS — Z7984 Long term (current) use of oral hypoglycemic drugs: Secondary | ICD-10-CM | POA: Diagnosis not present

## 2017-03-29 NOTE — Progress Notes (Signed)
Radiation Oncology         [phone] ________________________________  Name: JESELLE Hensley MRN: [id number]  Date: 03/29/2017  DOB: 1941-01-01  Vaginal Brachytherapy Procedure Note  CC: [name], MD Nancy Marus, MD    ICD-9-CM ICD-10-CM   1. 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 Patient denies any pain, bladder/bowel issues, or vaginal bleeding/discharge. She reports improving energy levels.  ALLERGIES: is allergic to calan [verapamil]; effexor [venlafaxine]; erythromycin; femara [letrozole]; fosamax [alendronate]; ibuprofen; paxil [paroxetine hcl]; remeron [mirtazapine]; tamoxifen; and vasotec [enalapril].  Meds: Current Outpatient Prescriptions  Medication Sig Dispense Refill  . acetaminophen (TYLENOL) 500 MG tablet Take 500 mg by mouth daily as needed for moderate pain or headache.    Marland Kitchen aspirin 81 MG tablet Take 81 mg by mouth daily.      . Calcium Carbonate-Vitamin D (CALCIUM 600 + D PO) Take 1 tablet by mouth 2 (two) times daily.     . Cholecalciferol (VITAMIN D) 2000 units CAPS Take 2,000 Units by mouth every other day.    . levothyroxine (SYNTHROID, LEVOTHROID) 88 MCG tablet Take 1 tablet (88 mcg total) by mouth daily before breakfast. (Patient taking differently: Take 44 mcg by mouth daily before breakfast. Take 89mcg pill M, W ,F and Sat) 90 tablet 1  . loratadine-pseudoephedrine (CLARITIN-D 24 HOUR) 10-240 MG 24 hr tablet Take 1 tablet by mouth daily as needed for allergies.    . metFORMIN (GLUCOPHAGE-XR) 500 MG 24 hr tablet TAKE ONE TABLET BY MOUTH WITH BREAKFAST AND LUNCH AND TAKE TWO TABLETS BY MOUTH WITH SUPPER FOR DAIBETES. (Patient taking differently: takes one tablet with supper) 360 tablet 1  . Multiple Vitamins-Minerals (MULTIVITAMIN PO) Take 1 tablet by mouth daily.     .  pravastatin (PRAVACHOL) 40 MG tablet Take 10 mg by mouth every evening.     . triamterene-hydrochlorothiazide (MAXZIDE) 75-50 MG tablet TAKE ONE TABLET BY MOUTH ONCE DAILY FOR BLOOD PRESSURE AND  FLUID (Patient taking differently: TAKE 1/2 TABLET BY MOUTH ONCE DAILY FOR BLOOD PRESSURE AND  FLUID) 90 tablet 3  . dexamethasone (DECADRON) 4 MG tablet Take 5 tablets (20mg ) with food 12 hours and 6 hours before taxol. (Patient not taking: Reported on 02/16/2017) 30 tablet 0  . LORazepam (ATIVAN) 0.5 MG tablet Take 1 tablet (0.5 mg total) by mouth every 6 (six) hours as needed for anxiety. (Patient not taking: Reported on 03/29/2017) 30 tablet 0   No current facility-administered medications for this encounter.     Physical Findings: The patient is in no acute distress. Patient is alert and oriented.  height is 5' 2.5" (1.588 m) and weight is 141 lb (64 kg). Her oral temperature is 98.2 F (36.8 C). Her blood pressure is 137/73 and her pulse is 78. Her oxygen saturation is 100%.   No palpable cervical, supraclavicular or axillary lymphoadenopathy. The heart has a regular rate and rhythm. The lungs are clear to auscultation. Abdomen soft and non-tender.  On pelvic examination the external genitalia were unremarkable. A speculum exam was performed. Vaginal cuff intact, no mucosal lesions. On bimanual exam there were no pelvic masses appreciated.  Lab Findings: Lab Results  Component Value Date   WBC 7.1 02/02/2017   HGB 13.0 02/02/2017   HCT 39.1 02/02/2017   MCV 78.7 (L) 02/02/2017   PLT 311 02/02/2017  Radiographic Findings: No results found.  Impression:   Patient was fitted for a vaginal cylinder. The patient will be treated with a 3 cm diameter cylinder with a treatment length of 3 cm. This distended the vaginal vault without undue discomfort. The patient tolerated the procedure well.  The patient was successfully fitted for a vaginal cylinder. The patient is appropriate to begin vaginal  brachytherapy.   Plan: The patient will proceed with CT simulation and vaginal brachytherapy today.    _______________________________   Blair Promise, PhD, MD  This document serves as a record of services personally performed by [name], MD. It was created on his behalf by Bethann Humble, a trained medical scribe. The creation of this record is based on the scribe's personal observations and the provider's statements to them. This document has been checked and approved by the attending provider.

## 2017-03-29 NOTE — Progress Notes (Signed)
  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 She will be treated with a 3 cm diameter segmented cylinder. This conforms to her anatomy without undue discomfort.  Vaginal brachytherapy procedure node: The patient was brought to the [place] suite. Identity was confirmed. All relevant records and images related to the planned course of therapy were reviewed. The patient freely provided informed written consent to proceed with treatment after reviewing the details related to the planned course of therapy. The consent form was witnessed and verified by the simulation staff. Then, the patient was set-up in a stable reproducible supine position for radiation therapy. Pelvic exam revealed the vaginal cuff to be intact. The patient's custom vaginal cylinder was placed in the proximal vagina. This was affixed to the CT/MR stabilization plate to prevent slippage. Patient tolerated the placement well.  Verification simulation note:  A fiducial marker was placed within the vaginal cylinder. An AP and lateral film was then obtained through the pelvis area. This documented accurate position of the vaginal cylinder for treatment.  HDR BRACHYTHERAPY TREATMENT  The remote afterloading device was affixed to the vaginal cylinder by catheter. Patient then proceeded to undergo her first high-dose-rate treatment directed at the proximal vagina. The patient was prescribed a dose of 6 gray to be delivered to the mucosal surface. Treatment length was 3 cm. Patient was treated with 1 channel using 7 dwell positions. Treatment time was 203.9 seconds.  Iridium 192 was the high-dose-rate source for treatment. The patient tolerated the treatment well. After completion of her therapy, a radiation survey was performed documenting return of the iridium source into the [employer] safe.   PLAN: The patient will return next week for her second treatment. ________________________________  Blair Promise, PhD, MD   This document serves as a record of services personally performed by [name], MD. It was created on his behalf by Bethann Humble, a trained medical scribe. The creation of this record is based on the scribe's personal observations and the provider's statements to them. This document has been checked and approved by the attending provider.

## 2017-03-29 NOTE — Progress Notes (Signed)
Tammy Hensley is here for follow up/HDR.  She denies having any pain, bladder/bowel issues or vaginal bleeding/discharge.  She reports her energy level is improving.    BP 137/73 (BP Location: Right Arm, Patient Position: Sitting)   Pulse 78   Temp 98.2 F (36.8 C) (Oral)   Ht 5' 2.5" (1.588 m)   Wt 141 lb (64 kg)   SpO2 100%   BMI 25.38 kg/m    Wt Readings from Last 3 Encounters:  03/29/17 141 lb (64 kg)  02/23/17 145 lb 3.2 oz (65.9 kg)  02/16/17 144 lb 3.2 oz (65.4 kg)

## 2017-03-29 NOTE — Progress Notes (Signed)
  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  Identity was confirmed.  All relevant records and images related to the planned course of therapy were reviewed.  The patient freely provided informed written consent to proceed with treatment after reviewing the details related to the planned course of therapy. The consent form was witnessed and verified by the simulation staff.  Then, the patient was set-up in a stable reproducible  supine position for radiation therapy.  CT images were obtained.  Surface markings were placed.  The CT images were loaded into the planning software.  Then the target and avoidance structures were contoured.  Treatment planning then occurred.  The radiation prescription was entered and confirmed.   I have requested : Brachytherapy Isodose Plan and Dosimetry Calculations to plan the radiation distribution.    PLAN:  The patient will receive 18 Gy in 3 fractions using Ir-192 as the high dose rate source. Patient will be treated with a 3 cm treatment length and 3 cm diameter segmented cylinder.    ________________________________  Blair Promise, PhD, MD   This document serves as a record of services personally performed by [name], MD. It was created on his behalf by Bethann Humble, a trained medical scribe. The creation of this record is based on the scribe's personal observations and the provider's statements to them. This document has been checked and approved by the attending provider.

## 2017-03-29 NOTE — Progress Notes (Signed)
Please see the Nurse Progress Note in the MD Initial Consult Encounter for this patient. 

## 2017-03-30 ENCOUNTER — Telehealth: Payer: Self-pay

## 2017-03-30 NOTE — Telephone Encounter (Signed)
Patient called and asked if it would be okay to have a root canal. She is having a tooth problem.

## 2017-03-30 NOTE — Telephone Encounter (Signed)
Yes, ok to proceed.

## 2017-03-31 NOTE — Telephone Encounter (Signed)
Called with below message, verbalized understanding. 

## 2017-04-01 ENCOUNTER — Inpatient Hospital Stay: Admission: RE | Admit: 2017-04-01 | Payer: Self-pay | Source: Ambulatory Visit | Admitting: Radiation Oncology

## 2017-04-05 ENCOUNTER — Telehealth: Payer: Self-pay | Admitting: *Deleted

## 2017-04-05 NOTE — Telephone Encounter (Signed)
Called patient to remind of HDR Tx. for 04-06-17 @ 10 am, spoke with patient and she is aware of this tx.

## 2017-04-06 ENCOUNTER — Ambulatory Visit
Admission: RE | Admit: 2017-04-06 | Discharge: 2017-04-06 | Disposition: A | Discharge status: Home / Self Care | Payer: Medicare Other | Source: Ambulatory Visit | Attending: Radiation Oncology | Admitting: Radiation Oncology

## 2017-04-06 DIAGNOSIS — C541 Malignant neoplasm of endometrium: Secondary | ICD-10-CM

## 2017-04-06 DIAGNOSIS — Z510 Encounter for antineoplastic radiation therapy: Secondary | ICD-10-CM | POA: Diagnosis not present

## 2017-04-06 NOTE — Progress Notes (Signed)
  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 She will be treated with a 3.0 cm diameter segmented cylinder. This conforms to her anatomy without undue discomfort.  Vaginal brachytherapy procedure node: The patient was brought to the [place] suite. Identity was confirmed. All relevant records and images related to the planned course of therapy were reviewed. The patient freely provided informed written consent to proceed with treatment after reviewing the details related to the planned course of therapy. The consent form was witnessed and verified by the simulation staff. Then, the patient was set-up in a stable reproducible supine position for radiation therapy. Pelvic exam revealed the vaginal cuff to be intact. The patient's custom vaginal cylinder was placed in the proximal vagina. This was affixed to the CT/MR stabilization plate to prevent slippage. Patient tolerated the placement well.  Verification simulation note:  A fiducial marker was placed within the vaginal cylinder. Hensley AP and lateral film was then obtained through the pelvis area. This documented accurate position of the vaginal cylinder for treatment.  HDR BRACHYTHERAPY TREATMENT  The remote afterloading device was affixed to the vaginal cylinder by catheter. Patient then proceeded to undergo her second high-dose-rate treatment directed at the proximal vagina. The patient was prescribed a dose of 6 gray to be delivered to the mucosal surface. Treatment length was 3.0 cm. Patient was treated with 1 channel using 7 dwell positions. Treatment time was 219.7  seconds. Iridium 192 was the high-dose-rate source for treatment. The patient tolerated the treatment well. After completion of her therapy, a radiation survey was performed documenting return of the iridium source into the [employer] safe.   PLAN: She will return next week for her third and final high-dose-rate treatment. ________________________________  Blair Promise, PhD, MD

## 2017-04-09 ENCOUNTER — Telehealth: Payer: Self-pay | Admitting: *Deleted

## 2017-04-09 NOTE — Telephone Encounter (Signed)
CALLED PATIENT TO REMIND OF HDR [place] 04-12-17 @ 10 AM, LVM FOR A RETURN CALL

## 2017-04-12 ENCOUNTER — Encounter: Payer: Self-pay | Admitting: Radiation Oncology

## 2017-04-12 ENCOUNTER — Ambulatory Visit
Admission: RE | Admit: 2017-04-12 | Discharge: 2017-04-12 | Disposition: A | Discharge status: Home / Self Care | Payer: Medicare Other | Source: Ambulatory Visit | Attending: Radiation Oncology | Admitting: Radiation Oncology

## 2017-04-12 DIAGNOSIS — Z510 Encounter for antineoplastic radiation therapy: Secondary | ICD-10-CM | POA: Diagnosis not present

## 2017-04-12 DIAGNOSIS — C541 Malignant neoplasm of endometrium: Secondary | ICD-10-CM

## 2017-04-12 NOTE — Progress Notes (Signed)
  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 She will be treated with a 3.0 cm diameter segmented cylinder. This conforms to her anatomy without undue discomfort.  Vaginal brachytherapy procedure node: The patient was brought to the [place] suite. Identity was confirmed. All relevant records and images related to the planned course of therapy were reviewed. The patient freely provided informed written consent to proceed with treatment after reviewing the details related to the planned course of therapy. The consent form was witnessed and verified by the simulation staff. Then, the patient was set-up in a stable reproducible supine position for radiation therapy. Pelvic exam revealed the vaginal cuff to be intact. The patient's custom vaginal cylinder was placed in the proximal vagina. This was affixed to the CT/MR stabilization plate to prevent slippage. Patient tolerated the placement well.  Verification simulation note:  A fiducial marker was placed within the vaginal cylinder. An AP and lateral film was then obtained through the pelvis area. This documented accurate position of the vaginal cylinder for treatment.  HDR BRACHYTHERAPY TREATMENT  The remote afterloading device was affixed to the vaginal cylinder by catheter. Patient then proceeded to undergo her third high-dose-rate treatment directed at the proximal vagina. The patient was prescribed a dose of 6 gray to be delivered to the mucosal surface. Treatment length was 3.0 cm. Patient was treated with 1 channel using 7 dwell positions. Treatment time was 232.5  seconds. Iridium 192 was the high-dose-rate source for treatment. The patient tolerated the treatment well. After completion of her therapy, a radiation survey was performed documenting return of the iridium source into the [employer] safe.   PLAN: She will return next month for a follow-up. The patient will resume chemotherapy May 24th. ________________________________  Blair Promise, PhD, MD   This document serves as a record of services personally performed by [name], MD. It was created on his behalf by Bethann Humble, a trained medical scribe. The creation of this record is based on the scribe's personal observations and the provider's statements to them. This document has been checked and approved by the attending provider.

## 2017-04-12 NOTE — Progress Notes (Signed)
  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 During treatment the patient reported diarrhea that she managed with Imodium. She also reported a slight decrease in energy level.  Plan: The patient has completed radiation treatment. The patient will return to radiation oncology clinic for routine followup in one month. I advised them to call or return sooner if they have any questions or concerns related to their recovery or treatment.  -----------------------------------  Blair Promise, PhD, MD  This document serves as a record of services personally performed by [name], MD. It was created on his behalf by Bethann Humble, a trained medical scribe. The creation of this record is based on the scribe's personal observations and the provider's statements to them. This document has been checked and approved by the attending provider.

## 2017-04-15 ENCOUNTER — Ambulatory Visit (HOSPITAL_BASED_OUTPATIENT_CLINIC_OR_DEPARTMENT_OTHER): Payer: Medicare Other | Admitting: Hematology and Oncology

## 2017-04-15 ENCOUNTER — Encounter: Payer: Self-pay | Admitting: Hematology and Oncology

## 2017-04-15 ENCOUNTER — Other Ambulatory Visit (HOSPITAL_BASED_OUTPATIENT_CLINIC_OR_DEPARTMENT_OTHER): Payer: Medicare Other

## 2017-04-15 ENCOUNTER — Ambulatory Visit (HOSPITAL_BASED_OUTPATIENT_CLINIC_OR_DEPARTMENT_OTHER): Payer: Medicare Other

## 2017-04-15 ENCOUNTER — Telehealth: Payer: Self-pay | Admitting: Hematology and Oncology

## 2017-04-15 VITALS — BP 150/73 | HR 95 | Temp 97.8°F | Resp 18 | Ht 62.0 in | Wt 146.5 lb

## 2017-04-15 DIAGNOSIS — C541 Malignant neoplasm of endometrium: Secondary | ICD-10-CM | POA: Diagnosis not present

## 2017-04-15 DIAGNOSIS — Z5111 Encounter for antineoplastic chemotherapy: Secondary | ICD-10-CM

## 2017-04-15 DIAGNOSIS — Z5189 Encounter for other specified aftercare: Secondary | ICD-10-CM

## 2017-04-15 DIAGNOSIS — Z853 Personal history of malignant neoplasm of breast: Secondary | ICD-10-CM | POA: Diagnosis not present

## 2017-04-15 DIAGNOSIS — Z170 Estrogen receptor positive status [ER+]: Secondary | ICD-10-CM

## 2017-04-15 DIAGNOSIS — I10 Essential (primary) hypertension: Secondary | ICD-10-CM

## 2017-04-15 DIAGNOSIS — C50412 Malignant neoplasm of upper-outer quadrant of left female breast: Secondary | ICD-10-CM

## 2017-04-15 LAB — CBC WITH DIFFERENTIAL/PLATELET
BASO%: 0.1 % (ref 0.0–2.0)
BASOS ABS: 0 10*3/uL (ref 0.0–0.1)
EOS ABS: 0 10*3/uL (ref 0.0–0.5)
EOS%: 0 % (ref 0.0–7.0)
HEMATOCRIT: 37.4 % (ref 34.8–46.6)
HEMOGLOBIN: 12.6 g/dL (ref 11.6–15.9)
LYMPH%: 5.8 % — ABNORMAL LOW (ref 14.0–49.7)
MCH: 27.5 pg (ref 25.1–34.0)
MCHC: 33.6 g/dL (ref 31.5–36.0)
MCV: 81.6 fL (ref 79.5–101.0)
MONO#: 0 10*3/uL — AB (ref 0.1–0.9)
MONO%: 0.7 % (ref 0.0–14.0)
NEUT%: 93.4 % — ABNORMAL HIGH (ref 38.4–76.8)
NEUTROS ABS: 4.9 10*3/uL (ref 1.5–6.5)
PLATELETS: 289 10*3/uL (ref 145–400)
RBC: 4.58 10*6/uL (ref 3.70–5.45)
RDW: 15.2 % — AB (ref 11.2–14.5)
WBC: 5.2 10*3/uL (ref 3.9–10.3)
lymph#: 0.3 10*3/uL — ABNORMAL LOW (ref 0.9–3.3)

## 2017-04-15 LAB — COMPREHENSIVE METABOLIC PANEL
ALBUMIN: 4.1 g/dL (ref 3.5–5.0)
ALK PHOS: 85 U/L (ref 40–150)
ALT: 14 U/L (ref 0–55)
AST: 15 U/L (ref 5–34)
Anion Gap: 13 mEq/L — ABNORMAL HIGH (ref 3–11)
BUN: 11.8 mg/dL (ref 7.0–26.0)
CALCIUM: 9.9 mg/dL (ref 8.4–10.4)
CO2: 24 mEq/L (ref 22–29)
Chloride: 103 mEq/L (ref 98–109)
Creatinine: 0.8 mg/dL (ref 0.6–1.1)
EGFR: 87 mL/min/{1.73_m2} — AB (ref >90)
GLUCOSE: 185 mg/dL — AB (ref 70–140)
POTASSIUM: 3.9 meq/L (ref 3.5–5.1)
SODIUM: 140 meq/L (ref 136–145)
Total Bilirubin: 0.3 mg/dL (ref 0.20–1.20)
Total Protein: 7.4 g/dL (ref 6.4–8.3)

## 2017-04-15 MED ORDER — DIPHENHYDRAMINE HCL 50 MG/ML IJ SOLN
INTRAMUSCULAR | Status: AC
  Filled 2017-04-15: qty 1

## 2017-04-15 MED ORDER — SODIUM CHLORIDE 0.9 % IV SOLN
Freq: Once | INTRAVENOUS | Status: AC
  Administered 2017-04-15: 11:00:00 via INTRAVENOUS

## 2017-04-15 MED ORDER — SODIUM CHLORIDE 0.9 % IV SOLN
Freq: Once | INTRAVENOUS | Status: AC
  Administered 2017-04-15: 12:00:00 via INTRAVENOUS
  Filled 2017-04-15: qty 5

## 2017-04-15 MED ORDER — SODIUM CHLORIDE 0.9 % IV SOLN
175.0000 mg/m2 | Freq: Once | INTRAVENOUS | Status: AC
  Administered 2017-04-15: 300 mg via INTRAVENOUS
  Filled 2017-04-15: qty 50

## 2017-04-15 MED ORDER — FAMOTIDINE IN NACL 20-0.9 MG/50ML-% IV SOLN
20.0000 mg | Freq: Once | INTRAVENOUS | Status: AC
  Administered 2017-04-15: 20 mg via INTRAVENOUS

## 2017-04-15 MED ORDER — ONDANSETRON HCL 4 MG/2ML IJ SOLN
INTRAMUSCULAR | Status: AC
  Filled 2017-04-15: qty 4

## 2017-04-15 MED ORDER — FAMOTIDINE IN NACL 20-0.9 MG/50ML-% IV SOLN
INTRAVENOUS | Status: AC
  Filled 2017-04-15: qty 50

## 2017-04-15 MED ORDER — ONDANSETRON HCL 4 MG/2ML IJ SOLN
8.0000 mg | Freq: Once | INTRAMUSCULAR | Status: AC
  Administered 2017-04-15: 8 mg via INTRAVENOUS

## 2017-04-15 MED ORDER — PEGFILGRASTIM 6 MG/0.6ML ~~LOC~~ PSKT
6.0000 mg | PREFILLED_SYRINGE | Freq: Once | SUBCUTANEOUS | Status: DC
Start: 2017-04-15 — End: 2017-04-15
  Administered 2017-04-15: 6 mg via SUBCUTANEOUS
  Filled 2017-04-15: qty 0.6

## 2017-04-15 MED ORDER — DEXAMETHASONE 4 MG PO TABS: ORAL_TABLET | ORAL | 0 refills | Status: DC

## 2017-04-15 MED ORDER — DIPHENHYDRAMINE HCL 50 MG/ML IJ SOLN
25.0000 mg | Freq: Once | INTRAMUSCULAR | Status: AC
  Administered 2017-04-15: 25 mg via INTRAVENOUS

## 2017-04-15 MED ORDER — SODIUM CHLORIDE 0.9 % IV SOLN
450.0000 mg | Freq: Once | INTRAVENOUS | Status: DC
  Administered 2017-04-15: 450 mg via INTRAVENOUS
  Filled 2017-04-15: qty 45

## 2017-04-15 NOTE — Assessment & Plan Note (Signed)
She has remote history of breast cancer. Continue screening program after she completes her treatment for uterine cancer Recent genetic testing was negative

## 2017-04-15 NOTE — Progress Notes (Signed)
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 SEE COMMENT.  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 The greatest extent of invasive carcinoma as measured on the needle core biopsy measures 0.6 cm.         Endometrial cancer ([place])   02/03/2002 Pathology Results    1. BENIGN ENDOMETRIAL POLYPS.  2. UTERINE FIBROIDS: PORTIONS OF SMOOTH MUSCLE CONSISTENT WITH BENIGN LEIOMYOMA(S). PORTIONS OF BENIGN, CYSTICALLY ATROPHIC ENDOMETRIUM WITHOUT HYPERPLASIA OR EVIDENCE OF MALIGNANCY. 3. ENDOMETRIAL CURETTAGE: BENIGN ENDOMETRIUM AND SMOOTH MUSCLE.      09/07/2016 Pathology Results    PAP smear positive for malignant cells      09/07/2016 Initial Diagnosis    Patient had postmenopausal bleeding in 08-2016. She had PAP 09-07-16 by PCP Dr [name], which documented carcinoma. She had evaluation by Dr [name] with colposcopy/ ECC/endometrial biopsy documenting serous endometrioid endometrial carcinoma.      09/15/2016 Imaging    Ct imaging showed markedly thickened and heterogeneous endometrial stripe suspicious for endometrial carcinoma in this patient with postmenopausal vaginal bleeding. Cystic lesion in the right ovary cannot be definitively characterized. Pelvic ultrasound is recommended for further evaluation. Aortoiliac atherosclerosis. Avascular necrosis of  the femoral heads bilaterally.      10/07/2016 Tumor Marker    Patient's tumor was tested for the following markers: CA125 Results of the tumor marker test revealed 15.1      10/13/2016 Pathology Results    1. Lymph node, sentinel, biopsy, right obturator - THERE IS NO EVIDENCE OF CARCINOMA IN 8 OF 8 LYMPH NODES (0/8). - SEE COMMENT. 2. Lymph node, sentinel, biopsy, left obturator - THERE IS NO EVIDENCE OF CARCINOMA IN 1 OF 1 LYMPH NODE (0/1). - SEE COMMENT. 3. Lymph node, sentinel, biopsy, left para-aortic - THERE IS NO EVIDENCE OF CARCINOMA IN 4 OF 4 LYMPH NODES (0/4). - SEE COMMENT. 4. Uterus +/- tubes/ovaries, neoplastic, cervix - INVASIVE MIXED SEROUS/ENDOMETRIOID ADENOCARCINOMA, MULTIPLE FOCI, FIGO GRADE III, THE LARGEST FOCUS SPANS 8.2 CM. - ADENOCARCINOMA EXTENDS INTO THE OUTER HALF OF THE MYOMETRIUM AND INVOLVES THE STROMA OF THE LOWER UTERINE SEGMENT AND CERVIX. - LYMPHOVASCULAR INVASION IS IDENTIFIED. - THE SURGICAL RESECTION MARGINS ARE NEGATIVE FOR CARCINOMA. - SEE ONCOLOGY TABLE BELOW. ADDITIONAL FINDINGS: - ENDOMETRIUM: ENDOMETRIOID TYPE POLYP(S), WHICH CONTAIN FOCI OF SIMILAR APPEARING MIXED SEROUS/ENDOMETRIOID ADENOCARCINOMA. - MYOMETRIUM: LEIOMYOMATA. - SEROSA: UNREMARKABLE. - BILATERAL ADNEXA: BENIGN OVARIES AND FALLOPIAN TUBES.      10/13/2016 Surgery    Surgery: Total robotic hysterectomy bilateral salpingo-oophorectomy, bilateral pelvic sentinel lymph node removal, left PA sentinel lymph node removal. Mini-laparotomy for specimen removal Surgeons:  [name], MD; [name], MD  Pathology:  1)Uterus, cervix, bilateral tubes and ovaries 2) Right obturator SLN 3) Left Obturator SLN 4) Left PA SLN Operative findings: 14 week fibroid uterus with one large dominant 6 cm myoma that was palpably calcified. 4 cm right ovarian cyst. + SLN identified in the bilateral obturator spaces, + SLN identified in the left  PA space.       12/01/2016 -   Chemotherapy    She received carboplatin & Taxol      01/11/2017 Genetic Testing    Testing was normal and did not reveal a mutation in these genes: The genes tested were the 80 genes on [employer]'s Multi-Cancer panel (ALK, APC, ATM, AXIN2, BAP1, BARD1, BLM, BMPR1A, BRCA1, BRCA2, BRIP1, CASR, CDC73, CDH1, CDK4, CDKN1B, CDKN1C, CDKN2A, CEBPA, CHEK2, DICER1, DIS3L2, EGFR, EPCAM, FH, FLCN, GATA2, GPC3, GREM1, HOXB13,  HRAS, KIT, MAX, MEN1, MET, MITF, MLH1, MSH2, MSH6, MUTYH, NBN, NF1, NF2, PALB2, PDGFRA, PHOX2B, PMS2, POLD1, POLE, POT1, PRKAR1A, PTCH1, PTEN, RAD50, RAD51C, RAD51D, RB1, RECQL4, RET, RUNX1, SDHA, SDHAF2, SDHB, SDHC, SDHD, SMAD4, SMARCA4, SMARCB1, SMARCE1, STK11, SUFU, TERC, TERT, TMEM127, TP53, TSC1, TSC2, VHL, WRN, and WT1).       INTERVAL HISTORY: Please see below for problem oriented charting. She returns to resume chemotherapy. She completed radiation treatment and tolerated that well. She has occasional loose bowel movement. She denies peripheral neuropathy.  Appetite is stable and she actually gained some weight.  REVIEW OF SYSTEMS:   Constitutional: Denies fevers, chills or abnormal weight loss Eyes: Denies blurriness of vision Ears, nose, mouth, throat, and face: Denies mucositis or sore throat Respiratory: Denies cough, dyspnea or wheezes Cardiovascular: Denies palpitation, chest discomfort or lower extremity swelling Gastrointestinal:  Denies nausea, heartburn or change in bowel habits Skin: Denies abnormal skin rashes Lymphatics: Denies new lymphadenopathy or easy bruising Neurological:Denies numbness, tingling or new weaknesses Behavioral/Psych: Mood is stable, no new changes  All other systems were reviewed with the patient and are negative.  I have reviewed the past medical history, past surgical history, social history and family history with the patient and they are unchanged from previous note.  ALLERGIES:  is allergic to calan [verapamil]; effexor  [venlafaxine]; erythromycin; femara [letrozole]; fosamax [alendronate]; ibuprofen; paxil [paroxetine hcl]; remeron [mirtazapine]; tamoxifen; and vasotec [enalapril].  MEDICATIONS:  Current Outpatient Prescriptions  Medication Sig Dispense Refill  . acetaminophen (TYLENOL) 500 MG tablet Take 500 mg by mouth daily as needed for moderate pain or headache.    Marland Kitchen aspirin 81 MG tablet Take 81 mg by mouth daily.      . Calcium Carbonate-Vitamin D (CALCIUM 600 + D PO) Take 1 tablet by mouth 2 (two) times daily.     . Cholecalciferol (VITAMIN D) 2000 units CAPS Take 2,000 Units by mouth every other day.    Marland Kitchen dexamethasone (DECADRON) 4 MG tablet Take 5 tablets ('20mg'$ ) with food 12 hours and 6 hours before taxol. 20 tablet 0  . levothyroxine (SYNTHROID, LEVOTHROID) 88 MCG tablet Take 1 tablet (88 mcg total) by mouth daily before breakfast. (Patient taking differently: Take 44 mcg by mouth daily before breakfast. Take 100mg pill M, W ,F and Sat) 90 tablet 1  . loratadine-pseudoephedrine (CLARITIN-D 24 HOUR) 10-240 MG 24 hr tablet Take 1 tablet by mouth daily as needed for allergies.    . metFORMIN (GLUCOPHAGE-XR) 500 MG 24 hr tablet TAKE ONE TABLET BY MOUTH WITH BREAKFAST AND LUNCH AND TAKE TWO TABLETS BY MOUTH WITH SUPPER FOR DAIBETES. (Patient taking differently: takes one tablet with supper) 360 tablet 1  . Multiple Vitamins-Minerals (MULTIVITAMIN PO) Take 1 tablet by mouth daily.     . pravastatin (PRAVACHOL) 40 MG tablet Take 10 mg by mouth every evening.     . triamterene-hydrochlorothiazide (MAXZIDE) 75-50 MG tablet TAKE ONE TABLET BY MOUTH ONCE DAILY FOR BLOOD PRESSURE AND  FLUID (Patient taking differently: TAKE  1/2 TABLET BY MOUTH ONCE DAILY FOR BLOOD PRESSURE AND  FLUID) 90 tablet 3  . LORazepam (ATIVAN) 0.5 MG tablet Take 1 tablet (0.5 mg total) by mouth every 6 (six) hours as needed for anxiety. (Patient not taking: Reported on 03/29/2017) 30 tablet 0   Current Facility-Administered Medications   Medication Dose Route Frequency Provider Last Rate Last Dose  . CARBOplatin (PARAPLATIN) 450 mg in sodium chloride 0.9 % 250 mL chemo infusion  450 mg Intravenous Once Alvy Bimler, Elza Sortor, MD      . PACLitaxel (TAXOL) 300 mg in sodium chloride 0.9 % 250 mL chemo infusion (> '80mg'$ /m2)  175 mg/m2 (Treatment Plan Recorded) Intravenous Once Alvy Bimler, Broden Holt, MD 100 mL/hr at 04/15/17 1208 300 mg at 04/15/17 1208  . pegfilgrastim (NEULASTA ONPRO KIT) injection 6 mg  6 mg Subcutaneous Once Alvy Bimler, Emiko Osorto, MD        PHYSICAL EXAMINATION: ECOG PERFORMANCE STATUS: 0 - Asymptomatic  Vitals:   04/15/17 0926  BP: (!) 150/73  Pulse: 95  Resp: 18  Temp: 97.8 F (36.6 C)   Filed Weights   04/15/17 0926  Weight: 146 lb 8 oz (66.5 kg)    GENERAL:alert, no distress and comfortable SKIN: skin color, texture, turgor are normal, no rashes or significant lesions EYES: normal, Conjunctiva are pink and non-injected, sclera clear OROPHARYNX:no exudate, no erythema and lips, buccal mucosa, and tongue normal  NECK: supple, thyroid normal size, non-tender, without nodularity LYMPH:  no palpable lymphadenopathy in the cervical, axillary or inguinal LUNGS: clear to auscultation and percussion with normal breathing effort HEART: regular rate & rhythm and no murmurs and no lower extremity edema ABDOMEN:abdomen soft, non-tender and normal bowel sounds Musculoskeletal:no cyanosis of digits and no clubbing  NEURO: alert & oriented x 3 with fluent speech, no focal motor/sensory deficits  LABORATORY DATA:  I have reviewed the data as listed    Component Value Date/Time   NA 140 04/15/2017 0902   K 3.9 04/15/2017 0902   CL 98 (L) 10/14/2016 0444   CL 99 12/20/2012 1336   CO2 24 04/15/2017 0902   GLUCOSE 185 (H) 04/15/2017 0902   GLUCOSE 110 (H) 12/20/2012 1336   BUN 11.8 04/15/2017 0902   CREATININE 0.8 04/15/2017 0902   CALCIUM 9.9 04/15/2017 0902   PROT 7.4 04/15/2017 0902   ALBUMIN 4.1 04/15/2017 0902   AST 15  04/15/2017 0902   ALT 14 04/15/2017 0902   ALKPHOS 85 04/15/2017 0902   BILITOT 0.30 04/15/2017 0902   GFRNONAA >60 10/14/2016 0444   GFRNONAA 85 07/09/2016 0950   GFRAA >60 10/14/2016 0444   GFRAA >89 07/09/2016 0950    No results found for: SPEP, UPEP  Lab Results  Component Value Date   WBC 5.2 04/15/2017   NEUTROABS 4.9 04/15/2017   HGB 12.6 04/15/2017   HCT 37.4 04/15/2017   MCV 81.6 04/15/2017   PLT 289 04/15/2017      Chemistry      Component Value Date/Time   NA 140 04/15/2017 0902   K 3.9 04/15/2017 0902   CL 98 (L) 10/14/2016 0444   CL 99 12/20/2012 1336   CO2 24 04/15/2017 0902   BUN 11.8 04/15/2017 0902   CREATININE 0.8 04/15/2017 0902      Component Value Date/Time   CALCIUM 9.9 04/15/2017 0902   ALKPHOS 85 04/15/2017 0902   AST 15 04/15/2017 0902   ALT 14 04/15/2017 0902   BILITOT 0.30 04/15/2017 0902       ASSESSMENT & PLAN:  Endometrial cancer [facility]) She tolerated radiation therapy well and has completed treatment without major side effects We will resume chemotherapy cycle #4 The plan would be to complete 6 cycles of treatment  Hypertension she will continue current medical management. She felt that her blood pressure is elevated today due to anxiety I recommend close follow-up with primary care doctor for medication adjustment.   Breast cancer, left breast [facility]) She has remote history of breast cancer. Continue screening program after she completes her treatment for uterine cancer Recent genetic testing was negative   Orders Placed This Encounter  Procedures  . TREATMENT CONDITIONS    IVs RUE only due to past left axillary sentinel nodes and radiation. Patient should have CBC & CMP within 7 days prior to chemotherapy administration. NOTIFY MD IF: ANC < 1500, Hemoglobin < 8, PLT < 100,000,  Total Bili > 1.5, Creatinine > 1.5, ALT & AST > 80 or if patient has unstable vital signs: Temperature > 38.5, SBP > 180 or < 90, RR > 30 or HR >  100.   All questions were answered. The patient knows to call the clinic with any problems, questions or concerns. No barriers to learning was detected. I spent 15 minutes counseling the patient face to face. The total time spent in the appointment was 20 minutes and more than 50% was on counseling and review of test results     [name], MD 04/15/2017 12:11 PM

## 2017-04-15 NOTE — Telephone Encounter (Signed)
Gave patient AVS and calender per 5/24 los  

## 2017-04-15 NOTE — Patient Instructions (Signed)
[facility] Discharge Instructions for Patients Receiving Chemotherapy  Today you received the following chemotherapy agents:  Carboplatin, Taxol  To help prevent nausea and vomiting after your treatment, we encourage you to take your nausea medication as prescribed.   If you develop nausea and vomiting that is not controlled by your nausea medication, call the clinic.   BELOW ARE SYMPTOMS THAT SHOULD BE REPORTED IMMEDIATELY:  *FEVER GREATER THAN 100.5 F  *CHILLS WITH OR WITHOUT FEVER  NAUSEA AND VOMITING THAT IS NOT CONTROLLED WITH YOUR NAUSEA MEDICATION  *UNUSUAL SHORTNESS OF BREATH  *UNUSUAL BRUISING OR BLEEDING  TENDERNESS IN MOUTH AND THROAT WITH OR WITHOUT PRESENCE OF ULCERS  *URINARY PROBLEMS  *BOWEL PROBLEMS  UNUSUAL RASH Items with * indicate a potential emergency and should be followed up as soon as possible.  Feel free to call the clinic you have any questions or concerns. The clinic phone number is [phone].  Please show the CHEMO ALERT CARD at check-in to the Emergency Department and triage nurse.   

## 2017-04-15 NOTE — Assessment & Plan Note (Signed)
she will continue current medical management. She felt that her blood pressure is elevated today due to anxiety I recommend close follow-up with primary care doctor for medication adjustment.

## 2017-04-15 NOTE — Assessment & Plan Note (Signed)
She tolerated radiation therapy well and has completed treatment without major side effects We will resume chemotherapy cycle #4 The plan would be to complete 6 cycles of treatment

## 2017-05-04 ENCOUNTER — Other Ambulatory Visit: Payer: Self-pay | Admitting: *Deleted

## 2017-05-04 MED ORDER — LEVOTHYROXINE SODIUM 88 MCG PO TABS: 88.0000 ug | ORAL_TABLET | Freq: Every day | ORAL | 1 refills | Status: DC

## 2017-05-06 ENCOUNTER — Other Ambulatory Visit (HOSPITAL_BASED_OUTPATIENT_CLINIC_OR_DEPARTMENT_OTHER): Payer: Medicare Other

## 2017-05-06 ENCOUNTER — Ambulatory Visit (HOSPITAL_BASED_OUTPATIENT_CLINIC_OR_DEPARTMENT_OTHER): Payer: Medicare Other | Admitting: Hematology and Oncology

## 2017-05-06 ENCOUNTER — Ambulatory Visit (HOSPITAL_BASED_OUTPATIENT_CLINIC_OR_DEPARTMENT_OTHER): Payer: Medicare Other

## 2017-05-06 ENCOUNTER — Encounter: Payer: Self-pay | Admitting: Hematology and Oncology

## 2017-05-06 DIAGNOSIS — C541 Malignant neoplasm of endometrium: Secondary | ICD-10-CM

## 2017-05-06 DIAGNOSIS — Z853 Personal history of malignant neoplasm of breast: Secondary | ICD-10-CM

## 2017-05-06 DIAGNOSIS — I10 Essential (primary) hypertension: Secondary | ICD-10-CM

## 2017-05-06 DIAGNOSIS — C50412 Malignant neoplasm of upper-outer quadrant of left female breast: Secondary | ICD-10-CM

## 2017-05-06 DIAGNOSIS — Z170 Estrogen receptor positive status [ER+]: Secondary | ICD-10-CM

## 2017-05-06 DIAGNOSIS — Z5189 Encounter for other specified aftercare: Secondary | ICD-10-CM | POA: Diagnosis not present

## 2017-05-06 DIAGNOSIS — Z5111 Encounter for antineoplastic chemotherapy: Secondary | ICD-10-CM

## 2017-05-06 LAB — COMPREHENSIVE METABOLIC PANEL
ALT: 13 U/L (ref 0–55)
AST: 14 U/L (ref 5–34)
Albumin: 4 g/dL (ref 3.5–5.0)
Alkaline Phosphatase: 90 U/L (ref 40–150)
Anion Gap: 14 mEq/L — ABNORMAL HIGH (ref 3–11)
BUN: 13.5 mg/dL (ref 7.0–26.0)
CO2: 24 meq/L (ref 22–29)
Calcium: 10.4 mg/dL (ref 8.4–10.4)
Chloride: 99 mEq/L (ref 98–109)
Creatinine: 0.8 mg/dL (ref 0.6–1.1)
EGFR: 84 mL/min/{1.73_m2} — AB (ref >90)
Glucose: 184 mg/dl — ABNORMAL HIGH (ref 70–140)
POTASSIUM: 3.5 meq/L (ref 3.5–5.1)
Sodium: 137 mEq/L (ref 136–145)
Total Bilirubin: 0.25 mg/dL (ref 0.20–1.20)
Total Protein: 7.7 g/dL (ref 6.4–8.3)

## 2017-05-06 LAB — CBC WITH DIFFERENTIAL/PLATELET
BASO%: 0.3 % (ref 0.0–2.0)
BASOS ABS: 0 10*3/uL (ref 0.0–0.1)
EOS ABS: 0 10*3/uL (ref 0.0–0.5)
EOS%: 0.4 % (ref 0.0–7.0)
HCT: 38.9 % (ref 34.8–46.6)
HGB: 13 g/dL (ref 11.6–15.9)
LYMPH%: 5.4 % — AB (ref 14.0–49.7)
MCH: 27.1 pg (ref 25.1–34.0)
MCHC: 33.3 g/dL (ref 31.5–36.0)
MCV: 81.2 fL (ref 79.5–101.0)
MONO#: 0.1 10*3/uL (ref 0.1–0.9)
MONO%: 1 % (ref 0.0–14.0)
NEUT%: 92.9 % — ABNORMAL HIGH (ref 38.4–76.8)
NEUTROS ABS: 5.3 10*3/uL (ref 1.5–6.5)
Platelets: 329 10*3/uL (ref 145–400)
RBC: 4.79 10*6/uL (ref 3.70–5.45)
RDW: 14.8 % — AB (ref 11.2–14.5)
WBC: 5.7 10*3/uL (ref 3.9–10.3)
lymph#: 0.3 10*3/uL — ABNORMAL LOW (ref 0.9–3.3)

## 2017-05-06 MED ORDER — DIPHENHYDRAMINE HCL 50 MG/ML IJ SOLN
INTRAMUSCULAR | Status: AC
  Filled 2017-05-06: qty 1

## 2017-05-06 MED ORDER — SODIUM CHLORIDE 0.9 % IV SOLN
Freq: Once | INTRAVENOUS | Status: AC
  Administered 2017-05-06: 10:00:00 via INTRAVENOUS

## 2017-05-06 MED ORDER — SODIUM CHLORIDE 0.9 % IV SOLN
Freq: Once | INTRAVENOUS | Status: AC
  Administered 2017-05-06: 11:00:00 via INTRAVENOUS
  Filled 2017-05-06: qty 5

## 2017-05-06 MED ORDER — FAMOTIDINE IN NACL 20-0.9 MG/50ML-% IV SOLN
20.0000 mg | Freq: Once | INTRAVENOUS | Status: AC
  Administered 2017-05-06: 20 mg via INTRAVENOUS

## 2017-05-06 MED ORDER — ONDANSETRON HCL 4 MG/2ML IJ SOLN
8.0000 mg | Freq: Once | INTRAMUSCULAR | Status: AC
  Administered 2017-05-06: 8 mg via INTRAVENOUS

## 2017-05-06 MED ORDER — SODIUM CHLORIDE 0.9 % IV SOLN
450.0000 mg | Freq: Once | INTRAVENOUS | Status: AC
  Administered 2017-05-06: 450 mg via INTRAVENOUS
  Filled 2017-05-06: qty 45

## 2017-05-06 MED ORDER — PEGFILGRASTIM 6 MG/0.6ML ~~LOC~~ PSKT
6.0000 mg | PREFILLED_SYRINGE | Freq: Once | SUBCUTANEOUS | Status: AC
  Administered 2017-05-06: 6 mg via SUBCUTANEOUS
  Filled 2017-05-06: qty 0.6

## 2017-05-06 MED ORDER — ONDANSETRON HCL 4 MG/2ML IJ SOLN
INTRAMUSCULAR | Status: AC
  Filled 2017-05-06: qty 4

## 2017-05-06 MED ORDER — FAMOTIDINE IN NACL 20-0.9 MG/50ML-% IV SOLN
INTRAVENOUS | Status: AC
  Filled 2017-05-06: qty 50

## 2017-05-06 MED ORDER — DIPHENHYDRAMINE HCL 50 MG/ML IJ SOLN
25.0000 mg | Freq: Once | INTRAMUSCULAR | Status: AC
  Administered 2017-05-06: 25 mg via INTRAVENOUS

## 2017-05-06 MED ORDER — PACLITAXEL CHEMO INJECTION 300 MG/50ML
175.0000 mg/m2 | Freq: Once | INTRAVENOUS | Status: AC
  Administered 2017-05-06: 300 mg via INTRAVENOUS
  Filled 2017-05-06: qty 50

## 2017-05-06 NOTE — Assessment & Plan Note (Signed)
she will continue current medical management. She felt that her blood pressure is elevated today due to anxiety I recommend close follow-up with primary care doctor for medication adjustment.

## 2017-05-06 NOTE — Progress Notes (Signed)
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 SEE COMMENT.  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 The greatest extent of invasive carcinoma as measured on the needle core biopsy measures 0.6 cm.         Endometrial cancer ([place])   02/03/2002 Pathology Results    1. BENIGN ENDOMETRIAL POLYPS.  2. UTERINE FIBROIDS: PORTIONS OF SMOOTH MUSCLE CONSISTENT WITH BENIGN LEIOMYOMA(S). PORTIONS OF BENIGN, CYSTICALLY ATROPHIC ENDOMETRIUM WITHOUT HYPERPLASIA OR EVIDENCE OF MALIGNANCY. 3. ENDOMETRIAL CURETTAGE: BENIGN ENDOMETRIUM AND SMOOTH MUSCLE.      09/07/2016 Pathology Results    PAP smear positive for malignant cells      09/07/2016 Initial Diagnosis    Patient had postmenopausal bleeding in 08-2016. She had PAP 09-07-16 by PCP Dr [name], which documented carcinoma. She had evaluation by Dr [name] with colposcopy/ ECC/endometrial biopsy documenting serous endometrioid endometrial carcinoma.      09/15/2016 Imaging    Ct imaging showed markedly thickened and heterogeneous endometrial stripe suspicious for endometrial carcinoma in this patient with postmenopausal vaginal bleeding. Cystic lesion in the right ovary cannot be definitively characterized. Pelvic ultrasound is recommended for further evaluation. Aortoiliac atherosclerosis. Avascular necrosis of  the femoral heads bilaterally.      10/07/2016 Tumor Marker    Patient's tumor was tested for the following markers: CA125 Results of the tumor marker test revealed 15.1      10/13/2016 Pathology Results    1. Lymph node, sentinel, biopsy, right obturator - THERE IS NO EVIDENCE OF CARCINOMA IN 8 OF 8 LYMPH NODES (0/8). - SEE COMMENT. 2. Lymph node, sentinel, biopsy, left obturator - THERE IS NO EVIDENCE OF CARCINOMA IN 1 OF 1 LYMPH NODE (0/1). - SEE COMMENT. 3. Lymph node, sentinel, biopsy, left para-aortic - THERE IS NO EVIDENCE OF CARCINOMA IN 4 OF 4 LYMPH NODES (0/4). - SEE COMMENT. 4. Uterus +/- tubes/ovaries, neoplastic, cervix - INVASIVE MIXED SEROUS/ENDOMETRIOID ADENOCARCINOMA, MULTIPLE FOCI, FIGO GRADE III, THE LARGEST FOCUS SPANS 8.2 CM. - ADENOCARCINOMA EXTENDS INTO THE OUTER HALF OF THE MYOMETRIUM AND INVOLVES THE STROMA OF THE LOWER UTERINE SEGMENT AND CERVIX. - LYMPHOVASCULAR INVASION IS IDENTIFIED. - THE SURGICAL RESECTION MARGINS ARE NEGATIVE FOR CARCINOMA. - SEE ONCOLOGY TABLE BELOW. ADDITIONAL FINDINGS: - ENDOMETRIUM: ENDOMETRIOID TYPE POLYP(S), WHICH CONTAIN FOCI OF SIMILAR APPEARING MIXED SEROUS/ENDOMETRIOID ADENOCARCINOMA. - MYOMETRIUM: LEIOMYOMATA. - SEROSA: UNREMARKABLE. - BILATERAL ADNEXA: BENIGN OVARIES AND FALLOPIAN TUBES.      10/13/2016 Surgery    Surgery: Total robotic hysterectomy bilateral salpingo-oophorectomy, bilateral pelvic sentinel lymph node removal, left PA sentinel lymph node removal. Mini-laparotomy for specimen removal Surgeons:  [name], MD; [name], MD  Pathology:  1)Uterus, cervix, bilateral tubes and ovaries 2) Right obturator SLN 3) Left Obturator SLN 4) Left PA SLN Operative findings: 14 week fibroid uterus with one large dominant 6 cm myoma that was palpably calcified. 4 cm right ovarian cyst. + SLN identified in the bilateral obturator spaces, + SLN identified in the left  PA space.       12/01/2016 -   Chemotherapy    She received carboplatin & Taxol      01/11/2017 Genetic Testing    Testing was normal and did not reveal a mutation in these genes: The genes tested were the 80 genes on [employer]'s Multi-Cancer panel (ALK, APC, ATM, AXIN2, BAP1, BARD1, BLM, BMPR1A, BRCA1, BRCA2, BRIP1, CASR, CDC73, CDH1, CDK4, CDKN1B, CDKN1C, CDKN2A, CEBPA, CHEK2, DICER1, DIS3L2, EGFR, EPCAM, FH, FLCN, GATA2, GPC3, GREM1, HOXB13,  HRAS, KIT, MAX, MEN1, MET, MITF, MLH1, MSH2, MSH6, MUTYH, NBN, NF1, NF2, PALB2, PDGFRA, PHOX2B, PMS2, POLD1, POLE, POT1, PRKAR1A, PTCH1, PTEN, RAD50, RAD51C, RAD51D, RB1, RECQL4, RET, RUNX1, SDHA, SDHAF2, SDHB, SDHC, SDHD, SMAD4, SMARCA4, SMARCB1, SMARCE1, STK11, SUFU, TERC, TERT, TMEM127, TP53, TSC1, TSC2, VHL, WRN, and WT1).       INTERVAL HISTORY: Please see below for problem oriented charting. She returns for further follow-up She is doing well She denies significant side effects from treatment Denies peripheral neuropathy No recent nausea or vomiting  REVIEW OF SYSTEMS:   Constitutional: Denies fevers, chills or abnormal weight loss Eyes: Denies blurriness of vision Ears, nose, mouth, throat, and face: Denies mucositis or sore throat Respiratory: Denies cough, dyspnea or wheezes Cardiovascular: Denies palpitation, chest discomfort or lower extremity swelling Gastrointestinal:  Denies nausea, heartburn or change in bowel habits Skin: Denies abnormal skin rashes Lymphatics: Denies new lymphadenopathy or easy bruising Neurological:Denies numbness, tingling or new weaknesses Behavioral/Psych: Mood is stable, no new changes  All other systems were reviewed with the patient and are negative.  I have reviewed the past medical history, past surgical history, social history and family history with the patient and they are unchanged from previous note.  ALLERGIES:  is allergic to calan [verapamil]; effexor [venlafaxine]; erythromycin; femara [letrozole]; fosamax [alendronate];  ibuprofen; paxil [paroxetine hcl]; remeron [mirtazapine]; tamoxifen; and vasotec [enalapril].  MEDICATIONS:  Current Outpatient Prescriptions  Medication Sig Dispense Refill  . acetaminophen (TYLENOL) 500 MG tablet Take 500 mg by mouth daily as needed for moderate pain or headache.    Marland Kitchen aspirin 81 MG tablet Take 81 mg by mouth daily.      . Calcium Carbonate-Vitamin D (CALCIUM 600 + D PO) Take 1 tablet by mouth 2 (two) times daily.     . Cholecalciferol (VITAMIN D) 2000 units CAPS Take 2,000 Units by mouth every other day.    Marland Kitchen dexamethasone (DECADRON) 4 MG tablet Take 5 tablets ('20mg'$ ) with food 12 hours and 6 hours before taxol. 20 tablet 0  . levothyroxine (SYNTHROID, LEVOTHROID) 88 MCG tablet Take 1 tablet (88 mcg total) by mouth daily before breakfast. 90 tablet 1  . loratadine-pseudoephedrine (CLARITIN-D 24 HOUR) 10-240 MG 24 hr tablet Take 1 tablet by mouth daily as needed for allergies.    . metFORMIN (GLUCOPHAGE-XR) 500 MG 24 hr tablet TAKE ONE TABLET BY MOUTH WITH BREAKFAST AND LUNCH AND TAKE TWO TABLETS BY MOUTH WITH SUPPER FOR DAIBETES. (Patient taking differently: takes one tablet with supper) 360 tablet 1  . Multiple Vitamins-Minerals (MULTIVITAMIN PO) Take 1 tablet by mouth daily.     . pravastatin (PRAVACHOL) 40 MG tablet Take 10 mg by mouth every evening.     . triamterene-hydrochlorothiazide (MAXZIDE) 75-50 MG tablet TAKE ONE TABLET BY MOUTH ONCE DAILY FOR BLOOD PRESSURE AND  FLUID (Patient taking differently: TAKE 1/2 TABLET BY MOUTH ONCE DAILY FOR BLOOD PRESSURE AND  FLUID) 90 tablet 3  . LORazepam (ATIVAN) 0.5 MG tablet Take 1 tablet (0.5 mg total)  by mouth every 6 (six) hours as needed for anxiety. (Patient not taking: Reported on 03/29/2017) 30 tablet 0   No current facility-administered medications for this visit.     PHYSICAL EXAMINATION: ECOG PERFORMANCE STATUS: 0 - Asymptomatic  Vitals:   05/06/17 0922  BP: (!) 144/80  Pulse: 99  Resp: 20  Temp: 98.2 F (36.8 C)    Filed Weights   05/06/17 0922  Weight: 146 lb (66.2 kg)    GENERAL:alert, no distress and comfortable SKIN: skin color, texture, turgor are normal, no rashes or significant lesions EYES: normal, Conjunctiva are pink and non-injected, sclera clear OROPHARYNX:no exudate, no erythema and lips, buccal mucosa, and tongue normal  NECK: supple, thyroid normal size, non-tender, without nodularity LYMPH:  no palpable lymphadenopathy in the cervical, axillary or inguinal LUNGS: clear to auscultation and percussion with normal breathing effort HEART: regular rate & rhythm and no murmurs and no lower extremity edema ABDOMEN:abdomen soft, non-tender and normal bowel sounds Musculoskeletal:no cyanosis of digits and no clubbing  NEURO: alert & oriented x 3 with fluent speech, no focal motor/sensory deficits  LABORATORY DATA:  I have reviewed the data as listed    Component Value Date/Time   NA 137 05/06/2017 0905   K 3.5 05/06/2017 0905   CL 98 (L) 10/14/2016 0444   CL 99 12/20/2012 1336   CO2 24 05/06/2017 0905   GLUCOSE 184 (H) 05/06/2017 0905   GLUCOSE 110 (H) 12/20/2012 1336   BUN 13.5 05/06/2017 0905   CREATININE 0.8 05/06/2017 0905   CALCIUM 10.4 05/06/2017 0905   PROT 7.7 05/06/2017 0905   ALBUMIN 4.0 05/06/2017 0905   AST 14 05/06/2017 0905   ALT 13 05/06/2017 0905   ALKPHOS 90 05/06/2017 0905   BILITOT 0.25 05/06/2017 0905   GFRNONAA >60 10/14/2016 0444   GFRNONAA 85 07/09/2016 0950   GFRAA >60 10/14/2016 0444   GFRAA >89 07/09/2016 0950    No results found for: SPEP, UPEP  Lab Results  Component Value Date   WBC 5.7 05/06/2017   NEUTROABS 5.3 05/06/2017   HGB 13.0 05/06/2017   HCT 38.9 05/06/2017   MCV 81.2 05/06/2017   PLT 329 05/06/2017      Chemistry      Component Value Date/Time   NA 137 05/06/2017 0905   K 3.5 05/06/2017 0905   CL 98 (L) 10/14/2016 0444   CL 99 12/20/2012 1336   CO2 24 05/06/2017 0905   BUN 13.5 05/06/2017 0905   CREATININE 0.8  05/06/2017 0905      Component Value Date/Time   CALCIUM 10.4 05/06/2017 0905   ALKPHOS 90 05/06/2017 0905   AST 14 05/06/2017 0905   ALT 13 05/06/2017 0905   BILITOT 0.25 05/06/2017 0905       ASSESSMENT & PLAN:  Endometrial cancer ([place]) She tolerated radiation therapy well and has completed treatment without major side effects We will proceed with cycle 5 of chemotherapy today The plan would be to complete 6 cycles of treatment  Breast cancer, 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 Continue screening program after she completes her treatment for uterine cancer Recent genetic testing was negative  Hypertension she will continue current medical management. She felt that her blood pressure is elevated today due to anxiety I recommend close follow-up with primary care doctor for medication adjustment.    No orders of the defined types were placed in this encounter.  All questions were answered. The patient knows to call the  clinic with any problems, questions or concerns. No barriers to learning was detected. I spent 15 minutes counseling the patient face to face. The total time spent in the appointment was 20 minutes and more than 50% was on counseling and review of test results     [name], MD 05/06/2017 4:40 PM

## 2017-05-06 NOTE — Assessment & Plan Note (Signed)
She tolerated radiation therapy well and has completed treatment without major side effects We will proceed with cycle 5 of chemotherapy today The plan would be to complete 6 cycles of treatment

## 2017-05-06 NOTE — Assessment & Plan Note (Signed)
She has remote history of breast cancer. Continue screening program after she completes her treatment for uterine cancer Recent genetic testing was negative

## 2017-05-06 NOTE — Patient Instructions (Signed)
[facility] Discharge Instructions for Patients Receiving Chemotherapy  Today you received the following chemotherapy agents Taxol and Carboplatin. To help prevent nausea and vomiting after your treatment, we encourage you to take your nausea medication as directed.  If you develop nausea and vomiting that is not controlled by your nausea medication, call the clinic.   BELOW ARE SYMPTOMS THAT SHOULD BE REPORTED IMMEDIATELY:  *FEVER GREATER THAN 100.5 F  *CHILLS WITH OR WITHOUT FEVER  NAUSEA AND VOMITING THAT IS NOT CONTROLLED WITH YOUR NAUSEA MEDICATION  *UNUSUAL SHORTNESS OF BREATH  *UNUSUAL BRUISING OR BLEEDING  TENDERNESS IN MOUTH AND THROAT WITH OR WITHOUT PRESENCE OF ULCERS  *URINARY PROBLEMS  *BOWEL PROBLEMS  UNUSUAL RASH Items with * indicate a potential emergency and should be followed up as soon as possible.  Feel free to call the clinic you have any questions or concerns. The clinic phone number is [phone].  Please show the CHEMO ALERT CARD at check-in to the Emergency Department and triage nurse.    

## 2017-05-11 ENCOUNTER — Encounter: Payer: Self-pay | Admitting: Internal Medicine

## 2017-05-12 ENCOUNTER — Encounter: Payer: Self-pay | Admitting: Oncology

## 2017-05-13 ENCOUNTER — Telehealth: Payer: Self-pay | Admitting: *Deleted

## 2017-05-13 ENCOUNTER — Encounter: Payer: Self-pay | Admitting: Radiation Oncology

## 2017-05-13 ENCOUNTER — Ambulatory Visit
Admission: RE | Admit: 2017-05-13 | Discharge: 2017-05-13 | Disposition: A | Discharge status: Home / Self Care | Payer: Medicare Other | Source: Ambulatory Visit | Attending: Radiation Oncology | Admitting: Radiation Oncology

## 2017-05-13 DIAGNOSIS — C541 Malignant neoplasm of endometrium: Secondary | ICD-10-CM | POA: Diagnosis present

## 2017-05-13 DIAGNOSIS — Z9221 Personal history of antineoplastic chemotherapy: Secondary | ICD-10-CM | POA: Insufficient documentation

## 2017-05-13 DIAGNOSIS — Z923 Personal history of irradiation: Secondary | ICD-10-CM | POA: Diagnosis not present

## 2017-05-13 NOTE — Telephone Encounter (Signed)
CALLED PATIENT TO INFORM OF 3 MONTH FU ON 08-16-17 @ 10 AM WITH DR. KINARD, LVM FOR RETURN CALL

## 2017-05-13 NOTE — Progress Notes (Signed)
  Home Care Instructions for the Insertion and Care of Your Vaginal Dilator  Why Do I Need a Vaginal Dilator?  Internal radiation therapy may cause scar tissue to form at the top of your vagina (vaginal cuff).  This may make vaginal examinations difficult in the future. You can prevent scar tissue from forming by using a vaginal dilator (a smooth plastic rod), and/or by having regular sexual intercourse.  If not using the dilator you should be having intercourse two or three times a week.  If you are unable to have intercourse, you should use your vaginal dilator.  You may have some spotting or bleeding from your dilator or intercourse the first few times. You may also have some discomfort. If discomfort occurs with intercourse, you and your partner may need to stop for a while and try again later.  How to Use Your Vaginal Dilator  - Wash the dilator with soap and water before and after each use. - Check the dilator to be sure it is smooth. Do not use the dilator if you find any roughspots. - Coat the dilator with K-Y Jelly, Astroglide, or Replens. Do not use Vaseline, baby oil, or other oil based lubricants. They are not water-soluble and can be irritating to the tissues in the vagina. - Lie on your back with your knees bent and legs apart. - Insert the rounded end of the dilator into your vagina as far as it will go without causing pain or discomfort. - Close your knees and slowly straighten your legs. - Keep the dilator in your vagina for about 10 to 15 minutes 3 times a week (for example, Monday, Wednesday and Friday). [facility] your knees, open your legs, and gently remove the dilator. - Gently cleanse the skin around the vaginal opening. - Wash the dilator after each use. -  It is important that you use the dilator routinely until instructed otherwise by your doctor.

## 2017-05-13 NOTE — Progress Notes (Signed)
Radiation Oncology         [phone] ________________________________  Name: Tammy Hensley MRN: [id number]  Date: 05/13/2017  DOB: 05-06-1941  Follow-Up Visit Note  CC: [name], MD  Nancy Marus, MD    ICD-10-CM   1. 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  She reports having occasional pain in her lower abdomen for the past few days.  She reports having nausea today.  She had chemotherapy a week ago.  She reports having some skin irritation on her labia.  She has been using a sitz bath which is helping this issue.  She denies having any bladder/bowel issues.  She reports seeing a spot of blood yesterday and thinks it was from the skin irritation.  She reports having fatigue.                                 ALLERGIES:  is allergic to calan [verapamil]; effexor [venlafaxine]; erythromycin; femara [letrozole]; fosamax [alendronate]; ibuprofen; paxil [paroxetine hcl]; remeron [mirtazapine]; tamoxifen; and vasotec [enalapril].  Meds: Current Outpatient Prescriptions  Medication Sig Dispense Refill  . acetaminophen (TYLENOL) 500 MG tablet Take 500 mg by mouth daily as needed for moderate pain or headache.    Marland Kitchen aspirin 81 MG tablet Take 81 mg by mouth daily.      . Calcium Carbonate-Vitamin D (CALCIUM 600 + D PO) Take 1 tablet by mouth 2 (two) times daily.     . Cholecalciferol (VITAMIN D) 2000 units CAPS Take 2,000 Units by mouth every other day.    Marland Kitchen dexamethasone (DECADRON) 4 MG tablet Take 5 tablets (20mg ) with food 12 hours and 6 hours before taxol. 20 tablet 0  . levothyroxine (SYNTHROID, LEVOTHROID) 88 MCG tablet Take 1 tablet (88 mcg  total) by mouth daily before breakfast. 90 tablet 1  . loratadine-pseudoephedrine (CLARITIN-D 24 HOUR) 10-240 MG 24 hr tablet Take 1 tablet by mouth daily as needed for allergies.    . metFORMIN (GLUCOPHAGE-XR) 500 MG 24 hr tablet TAKE ONE TABLET BY MOUTH WITH BREAKFAST AND LUNCH AND TAKE TWO TABLETS BY MOUTH WITH SUPPER FOR DAIBETES. (Patient taking differently: takes one tablet with supper) 360 tablet 1  . Multiple Vitamins-Minerals (MULTIVITAMIN PO) Take 1 tablet by mouth daily.     . pravastatin (PRAVACHOL) 40 MG tablet Take 10 mg by mouth every evening.     . triamterene-hydrochlorothiazide (MAXZIDE) 75-50 MG tablet TAKE ONE TABLET BY MOUTH ONCE DAILY FOR BLOOD PRESSURE AND  FLUID (Patient taking differently: TAKE 1/2 TABLET BY MOUTH ONCE DAILY FOR BLOOD PRESSURE AND  FLUID) 90 tablet 3  . LORazepam (ATIVAN) 0.5 MG tablet Take 1 tablet (0.5 mg total) by mouth every 6 (six) hours as needed for anxiety. (Patient not taking: Reported on 03/29/2017) 30 tablet 0   No current facility-administered medications for this encounter.     Physical Findings: The patient is in no acute distress. Patient is alert and oriented.  height is 5\' 2"  (1.575 m) and weight is 144 lb 12.8 oz (65.7 kg). Her oral temperature is 98.3 F (36.8 C). Her blood pressure is  141/75 (abnormal) and her pulse is 97. Her oxygen saturation is 100%. .  No significant changes. Lungs are clear to auscultation bilaterally. Heart has regular rate and rhythm. No palpable cervical, supraclavicular, or axillary adenopathy. Abdomen soft, non-tender, normal bowel sounds.   Lab Findings: Lab Results  Component Value Date   WBC 5.7 05/06/2017   HGB 13.0 05/06/2017   HCT 38.9 05/06/2017   MCV 81.2 05/06/2017   PLT 329 05/06/2017    Radiographic Findings: No results found.  Impression:  The patient is recovering from the effects of radiation.  She has been given size S+ and M vaginal dilators and instructions.  Plan:  She will see Dr.  Alycia Rossetti later this month for examination. Routine follow-up in radiation oncology in 3 months.  -----------------------------------  Blair Promise, PhD, MD  This document serves as a record of services personally performed by [name], MD. It was created on his behalf by Linward Natal, a trained medical scribe. The creation of this record is based on the scribe's personal observations and the provider's statements to them. This document has been checked and approved by the attending provider.

## 2017-05-13 NOTE — Progress Notes (Signed)
Tammy Hensley is here for follow up.  She reports having occasional pain in her lower abdomen for the past few days.  She reports having nausea today.  She had chemotherapy a week ago.  She reports having some skin irritation on her labia.  She has been using a sitz bath.  She denies having any bladder/bowel issues.  She reports seeing a spot of blood yesterday and thinks it was from the skin irritation.  She reports having fatigue.  She has been given size S+ and M vaginal dilators.  BP (!) 141/75 (BP Location: Right Arm, Patient Position: Sitting)   Pulse 97   Temp 98.3 F (36.8 C) (Oral)   Ht 5\' 2"  (1.575 m)   Wt 144 lb 12.8 oz (65.7 kg)   SpO2 100%   BMI 26.48 kg/m    Wt Readings from Last 3 Encounters:  05/13/17 144 lb 12.8 oz (65.7 kg)  05/06/17 146 lb (66.2 kg)  04/15/17 146 lb 8 oz (66.5 kg)

## 2017-05-19 ENCOUNTER — Other Ambulatory Visit: Payer: Self-pay

## 2017-05-19 ENCOUNTER — Encounter: Payer: Self-pay | Admitting: Gynecologic Oncology

## 2017-05-19 ENCOUNTER — Ambulatory Visit: Payer: Medicare Other | Attending: Gynecologic Oncology | Admitting: Gynecologic Oncology

## 2017-05-19 ENCOUNTER — Other Ambulatory Visit: Payer: Medicare Other

## 2017-05-19 VITALS — BP 144/73 | HR 105 | Temp 98.2°F | Resp 20 | Wt 143.2 lb

## 2017-05-19 DIAGNOSIS — Z886 Allergy status to analgesic agent status: Secondary | ICD-10-CM | POA: Diagnosis not present

## 2017-05-19 DIAGNOSIS — C541 Malignant neoplasm of endometrium: Secondary | ICD-10-CM

## 2017-05-19 DIAGNOSIS — Z90710 Acquired absence of both cervix and uterus: Secondary | ICD-10-CM | POA: Insufficient documentation

## 2017-05-19 DIAGNOSIS — Z803 Family history of malignant neoplasm of breast: Secondary | ICD-10-CM | POA: Insufficient documentation

## 2017-05-19 DIAGNOSIS — E119 Type 2 diabetes mellitus without complications: Secondary | ICD-10-CM | POA: Diagnosis not present

## 2017-05-19 DIAGNOSIS — F329 Major depressive disorder, single episode, unspecified: Secondary | ICD-10-CM | POA: Insufficient documentation

## 2017-05-19 DIAGNOSIS — N9089 Other specified noninflammatory disorders of vulva and perineum: Secondary | ICD-10-CM

## 2017-05-19 DIAGNOSIS — N950 Postmenopausal bleeding: Secondary | ICD-10-CM | POA: Diagnosis not present

## 2017-05-19 DIAGNOSIS — Z888 Allergy status to other drugs, medicaments and biological substances status: Secondary | ICD-10-CM | POA: Insufficient documentation

## 2017-05-19 DIAGNOSIS — Z7982 Long term (current) use of aspirin: Secondary | ICD-10-CM | POA: Insufficient documentation

## 2017-05-19 DIAGNOSIS — D509 Iron deficiency anemia, unspecified: Secondary | ICD-10-CM | POA: Diagnosis not present

## 2017-05-19 DIAGNOSIS — I10 Essential (primary) hypertension: Secondary | ICD-10-CM | POA: Insufficient documentation

## 2017-05-19 DIAGNOSIS — Z809 Family history of malignant neoplasm, unspecified: Secondary | ICD-10-CM | POA: Insufficient documentation

## 2017-05-19 DIAGNOSIS — E785 Hyperlipidemia, unspecified: Secondary | ICD-10-CM | POA: Diagnosis not present

## 2017-05-19 DIAGNOSIS — Z8042 Family history of malignant neoplasm of prostate: Secondary | ICD-10-CM | POA: Insufficient documentation

## 2017-05-19 DIAGNOSIS — Z853 Personal history of malignant neoplasm of breast: Secondary | ICD-10-CM | POA: Diagnosis not present

## 2017-05-19 DIAGNOSIS — Z7984 Long term (current) use of oral hypoglycemic drugs: Secondary | ICD-10-CM | POA: Diagnosis not present

## 2017-05-19 DIAGNOSIS — Z8249 Family history of ischemic heart disease and other diseases of the circulatory system: Secondary | ICD-10-CM | POA: Insufficient documentation

## 2017-05-19 DIAGNOSIS — Z881 Allergy status to other antibiotic agents status: Secondary | ICD-10-CM | POA: Insufficient documentation

## 2017-05-19 DIAGNOSIS — Z90722 Acquired absence of ovaries, bilateral: Secondary | ICD-10-CM | POA: Diagnosis not present

## 2017-05-19 DIAGNOSIS — E559 Vitamin D deficiency, unspecified: Secondary | ICD-10-CM | POA: Diagnosis not present

## 2017-05-19 NOTE — Progress Notes (Signed)
GYNECOLOGIC ONCOLOGY RETURN PATIENT CONSULTATION  Referring Provider: Dr. [name]   HISTORY OF PRESENT ILLNESS: Tammy Hensley is a 76 y.o. woman who was seen in consultation at the request of Dr. [name] for evaluation of endometrial cancer.   The patient presented to her PCP with postmenopausal bleeding. A pap smear was obtained showing carcinoma. She was seen by Dr. [name] and underwent colposcopy with ECC and EMB. 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 ECC and cervical biopsy were benign.  She has a personal history of stage II breast cancer. She took tamoxifen for 5 years. She reports no bleeding or GYN complaint until ~10/20. She has continued occasional spotting. She denies changes in bowel or bladder habits. No weight loss or anorexia. She does report undergoing genetic testing with her breast cancer 11 years ago which was reportedly negative. She has two family members with prostate cancer (dad and brother) and no family history of colon, breast, uterine or ovarian cancer.   CT 10/24: Reproductive: The endometrial stripe is markedly thickened at 3.5 cmwith marked heterogeneity identified. Large calcified fibroid isnoted. Cystic lesion in the right ovary measures 3.5 by 2.9 by 3.9cm. Left adnexa is unremarkable.  ENCOUNTER DATE: 10/13/2016 Surgery: Total robotic hysterectomy bilateral salpingo-oophorectomy, bilateral pelvic sentinel lymph node removal, left PA sentinel lymph node removal. Mini-laparotomy for specimen removal  Pathology:  1)Uterus, cervix, bilateral tubes and ovaries 2) Right obturator SLN 3) Left Obturator SLN 4) Left PA SLN  Operative findings: 14 week fibroid uterus with one large dominant 6 cm myoma that was palpably calcified. 4 cm right ovarian cyst. + SLN identified in the bilateral obturator spaces, + SLN identified in the left PA space.  Diagnosis 1. Lymph node, sentinel, biopsy, right obturator - THERE IS NO  EVIDENCE OF CARCINOMA IN 8 OF 8 LYMPH NODES (0/8). - SEE COMMENT. 2. Lymph node, sentinel, biopsy, left obturator - THERE IS NO EVIDENCE OF CARCINOMA IN 1 OF 1 LYMPH NODE (0/1). - SEE COMMENT. 3. Lymph node, sentinel, biopsy, left para-aortic - THERE IS NO EVIDENCE OF CARCINOMA IN 4 OF 4 LYMPH NODES (0/4). - SEE COMMENT. 4. Uterus +/- tubes/ovaries, neoplastic, cervix - INVASIVE MIXED SEROUS/ENDOMETRIOID ADENOCARCINOMA, MULTIPLE FOCI, FIGO GRADE III, THE LARGEST FOCUS SPANS 8.2 CM. - ADENOCARCINOMA EXTENDS INTO THE OUTER HALF OF THE MYOMETRIUM AND INVOLVES THE STROMA OF THE LOWER UTERINE SEGMENT AND CERVIX. - LYMPHOVASCULAR INVASION IS IDENTIFIED. - THE SURGICAL RESECTION MARGINS ARE NEGATIVE FOR CARCINOMA. - SEE ONCOLOGY TABLE BELOW. ADDITIONAL FINDINGS: - ENDOMETRIUM: ENDOMETRIOID TYPE POLYP(S), WHICH CONTAIN FOCI OF SIMILAR APPEARING MIXED SEROUS/ENDOMETRIOID ADENOCARCINOMA. - MYOMETRIUM: LEIOMYOMATA. - SEROSA: UNREMARKABLE. - BILATERAL ADNEXA: BENIGN OVARIES AND FALLOPIAN TUBES.  Interval History:  She completed her radiation: 02/10/17-03/16/17; 03/29/17-04/12/17;  1) Pelvis/ 45 Gy in 25 fractions  2) Vaginal Cuff/ 18 Gy in 3 fractions  She has cycle #5 of chemotherapy on June 14. She comes in today for follow-up. She's overall doing really quite well. She's tolerated her treatment very well. She states the most difficult part with some of the fatigue and the diarrhea she had during her radiation but that is resolved. She is able to use a small dilator but not the large one as it is too hard and it was painful externally. She and her husband have not had intercourse since her surgery. She's had no bleeding. She has no neuropathy or other symptoms from the chemotherapy. She denies any nausea vomiting. She denies any vaginal bleeding or discharge.  PAST MEDICAL HISTORY: Past Medical History:  Diagnosis Date  . Depression   . Diabetes ([place])    borderline  . Family history of  cancer   . History of brachytherapy 03/29/17-04/12/17   vaginal cuff 18 Gy in 3 fractions  . History of radiation therapy 02/10/17-03/16/17   pelvis 45 Gy in 25 fractions  . Hyperlipidemia   . Hypertension   . Iron deficiency anemia   . Vitamin D deficiency     PAST SURGICAL HISTORY: Past Surgical History:  Procedure Laterality Date  . BREAST SURGERY  2006   left breast lumpectomy- radiation, took Femara   . 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 ROBOTIC ASSISTED LAP VAGINAL HYSTERECTOMY N/A 10/13/2016   Procedure: XI ROBOTIC ASSISTED LAPAROSCOPIC VAGINAL HYSTERECTOMY;  Surgeon: Nancy Marus, MD;  Location: [facility];  Service: Gynecology;  Laterality: N/A;  . ROBOTIC ASSISTED SALPINGO OOPHERECTOMY Bilateral 10/13/2016   Procedure: XI ROBOTIC ASSISTED SALPINGO OOPHORECTOMY;  Surgeon: Nancy Marus, MD;  Location: [facility];  Service: Gynecology;  Laterality: Bilateral;  . SENTINEL NODE BIOPSY N/A 10/13/2016   Procedure: SENTINEL NODE BIOPSY;  Surgeon: Nancy Marus, MD;  Location: [facility];  Service: Gynecology;  Laterality: N/A;    OB/GYN HISTORY: OB History  Gravida Para Term Preterm AB Living  4 2 2   2 2   SAB TAB Ectopic Multiple Live Births  2       2    # Outcome Date GA Lbr Len/2nd Weight Sex Delivery Anes PTL Lv  4 SAB           3 SAB           2 Term           1 Term             Obstetric Comments  SVDx2     Age at menopause: ~50 Hx of HRT: denies Hx of STDs: denies Last pap: 2017  History of abnormal pap smears: denies  MEDICATIONS:  Current Outpatient Prescriptions:  .  acetaminophen (TYLENOL) 500 MG tablet, Take 500 mg by mouth daily as needed for moderate pain or headache., Disp: , Rfl:  .  aspirin 81 MG tablet, Take 81 mg by mouth daily.  , Disp: , Rfl:  .  Calcium Carbonate-Vitamin D (CALCIUM 600 + D PO), Take 1 tablet by mouth 2 (two) times daily. , Disp: , Rfl:  .  Cholecalciferol  (VITAMIN D) 2000 units CAPS, Take 2,000 Units by mouth every other day., Disp: , Rfl:  .  dexamethasone (DECADRON) 4 MG tablet, Take 5 tablets (20mg ) with food 12 hours and 6 hours before taxol., Disp: 20 tablet, Rfl: 0 .  levothyroxine (SYNTHROID, LEVOTHROID) 88 MCG tablet, Take 1 tablet (88 mcg total) by mouth daily before breakfast., Disp: 90 tablet, Rfl: 1 .  loratadine-pseudoephedrine (CLARITIN-D 24 HOUR) 10-240 MG 24 hr tablet, Take 1 tablet by mouth daily as needed for allergies., Disp: , Rfl:  .  LORazepam (ATIVAN) 0.5 MG tablet, Take 1 tablet (0.5 mg total) by mouth every 6 (six) hours as needed for anxiety. (Patient not taking: Reported on 03/29/2017), Disp: 30 tablet, Rfl: 0 .  metFORMIN (GLUCOPHAGE-XR) 500 MG 24 hr tablet, TAKE ONE TABLET BY MOUTH WITH BREAKFAST AND LUNCH AND TAKE TWO TABLETS BY MOUTH WITH SUPPER FOR DAIBETES. (Patient taking differently: takes one tablet with supper), Disp: 360 tablet, Rfl: 1 .  Multiple Vitamins-Minerals (MULTIVITAMIN PO), Take 1 tablet by mouth daily. , Disp: , Rfl:  .  pravastatin (PRAVACHOL) 40 MG tablet, Take 10 mg by mouth every evening. , Disp: , Rfl:  .  triamterene-hydrochlorothiazide (MAXZIDE) 75-50 MG tablet, TAKE ONE TABLET BY MOUTH ONCE DAILY FOR BLOOD PRESSURE AND  FLUID (Patient taking differently: TAKE 1/2 TABLET BY MOUTH ONCE DAILY FOR BLOOD PRESSURE AND  FLUID), Disp: 90 tablet, Rfl: 3  ALLERGIES: Allergies  Allergen Reactions  . Calan [Verapamil]     Constipation   . Effexor [Venlafaxine]     unknown  . Erythromycin Nausea And Vomiting  . Femara [Letrozole] Swelling  . Fosamax [Alendronate]     aching  . Ibuprofen     Dizziness, incoherent   . Paxil [Paroxetine Hcl] Nausea Only  . Remeron [Mirtazapine]     Weight gain   . Tamoxifen     Hair loss @ low dose  . Vasotec [Enalapril] Cough    FAMILY HISTORY: Family History  Problem Relation Age of Onset  . Heart disease Mother   . Heart attack Mother   . Cancer Father    . Heart attack Father   . Prostate cancer Father 82       deceased 73  . Breast cancer Paternal Aunt 63       deceased 50s  . Prostate cancer Paternal Uncle 30       deceased 67  . Cancer Paternal Grandmother        unsure; deceased 41s  . Prostate cancer Brother 91       currently 41  . Prostate cancer Brother 20       currently 87    SOCIAL HISTORY: Social History   Social History  . Marital status: Married    Spouse name: N/A  . Number of children: 2  . Years of education: N/A   Occupational History  . Not on file.   Social History Main Topics  . Smoking status: Never Smoker  . Smokeless tobacco: Never Used  . Alcohol use No  . Drug use: No  . Sexual activity: Not on file   Other Topics Concern  . Not on file   Social History Narrative  . No narrative on file    REVIEW OF SYSTEMS: Complete 10-system review is negative except as per HPI.  PHYSICAL EXAM: BP (!) 144/73 (BP Location: Right Arm, Patient Position: Sitting)   Pulse (!) 105   Temp 98.2 F (36.8 C) (Oral)   Resp 20   Wt 143 lb 3.2 oz (65 kg)   SpO2 100%   BMI 26.19 kg/m  General: Alert, oriented, no acute distress.  Neck: Supple, no lymphadenopathy, no thyromegaly.  Lungs: Clear to auscultation bilaterally.  Cardiac: Regular rate and rhythm.  Abdomen/GI: Normoactive bowel sounds.  Soft, nondistended, nontender to palpation.  Well healed surgical incisions, no appreciable hernias.  Extremities: Grossly normal range of motion.  Warm, well perfused.  No edema bilaterally.  Groins: No lymphadenopathy.  GU: External genitalia with a small ulcer on the labia majora on her right side it could he consistent with HSV. Culture was obtained. Speculum examination reveals no visible lesions. The vaginal cuff is well-healed. Bimanual examination, the vagina accommodated to fingers. There are no palpable masses. There is no nodularity.   ASSESSMENT AND PLAN: Tammy Hensley is a 76 y.o. 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 I appreciate our colleagues both in radiation oncology and medical oncology for their assistance in the care of this lovely patient. She will return to see me in 4 months. We can then begin alternating her follow-up visits.  We will follow-up on her HSV culture from today. We discussed vaginal dilator use and intercourse. Her questions regarding this were elicited in answer to her satisfaction. We will notify her of the results from today.

## 2017-05-19 NOTE — Patient Instructions (Signed)
Good luck with the remainder of your treatment. Return to see [place] in 4 months and then we'll begin alternating visits with all of your physicians.

## 2017-05-21 LAB — HERPES SIMPLEX VIRUS CULTURE

## 2017-05-24 ENCOUNTER — Telehealth: Payer: Self-pay | Admitting: Gynecologic Oncology

## 2017-05-24 NOTE — Telephone Encounter (Signed)
Left message for patient asking her to please call the office. 

## 2017-05-27 ENCOUNTER — Encounter: Payer: Self-pay | Admitting: Internal Medicine

## 2017-05-27 ENCOUNTER — Ambulatory Visit (INDEPENDENT_AMBULATORY_CARE_PROVIDER_SITE_OTHER): Payer: Medicare Other | Admitting: Internal Medicine

## 2017-05-27 VITALS — BP 122/78 | HR 88 | Temp 97.8°F | Resp 16 | Ht 63.0 in | Wt 145.0 lb

## 2017-05-27 DIAGNOSIS — E039 Hypothyroidism, unspecified: Secondary | ICD-10-CM

## 2017-05-27 DIAGNOSIS — R6889 Other general symptoms and signs: Secondary | ICD-10-CM

## 2017-05-27 DIAGNOSIS — N182 Chronic kidney disease, stage 2 (mild): Secondary | ICD-10-CM | POA: Diagnosis not present

## 2017-05-27 DIAGNOSIS — E559 Vitamin D deficiency, unspecified: Secondary | ICD-10-CM | POA: Diagnosis not present

## 2017-05-27 DIAGNOSIS — E1122 Type 2 diabetes mellitus with diabetic chronic kidney disease: Secondary | ICD-10-CM

## 2017-05-27 DIAGNOSIS — I10 Essential (primary) hypertension: Secondary | ICD-10-CM | POA: Diagnosis not present

## 2017-05-27 DIAGNOSIS — Z1212 Encounter for screening for malignant neoplasm of rectum: Secondary | ICD-10-CM

## 2017-05-27 DIAGNOSIS — Z79899 Other long term (current) drug therapy: Secondary | ICD-10-CM

## 2017-05-27 DIAGNOSIS — E782 Mixed hyperlipidemia: Secondary | ICD-10-CM

## 2017-05-27 DIAGNOSIS — Z136 Encounter for screening for cardiovascular disorders: Secondary | ICD-10-CM | POA: Diagnosis not present

## 2017-05-27 DIAGNOSIS — Z0001 Encounter for general adult medical examination with abnormal findings: Secondary | ICD-10-CM | POA: Diagnosis not present

## 2017-05-27 LAB — LIPID PANEL
CHOLESTEROL: 135 mg/dL (ref <200)
HDL: 46 mg/dL — AB (ref >50)
LDL CALC: 54 mg/dL (ref <100)
TRIGLYCERIDES: 173 mg/dL — AB (ref <150)
Total CHOL/HDL Ratio: 2.9 Ratio (ref <5.0)
VLDL: 35 mg/dL — AB (ref <30)

## 2017-05-27 LAB — TSH: TSH: 0.62 mIU/L

## 2017-05-27 NOTE — Progress Notes (Signed)
[facility] [name], M.D.      Uvaldo Bristle. Silverio Lay, P.A.-C [facility]                [address] [place] [address] Telephone [phone] Telefax [phone]  Annual Screening/Preventative Visit & Comprehensive Evaluation &  Examination     This very nice 76 y.o. [name] presents for a Screening/Preventative Visit & comprehensive evaluation and management of multiple medical co-morbidities.  Patient has been followed for HTN, T2_NIDDM  Prediabetes, Hyperlipidemia and Vitamin D Deficiency.     Patient was dx'd with Uterine Carcinoma in Oct 2017 and undergoing TAH/BSO (Dr's  Alycia Rossetti & Laurance Flatten)  in Nov 2017 and started Chemo w/Carboplatin/Taxol in Jan 2018 per Dr Alvy Bimler and ext Radiation thru Mar per Dr Sondra Come. Patient also has hx/o Lt Breast Cancer in 2006 followed by adjuvant Tamoxifen x 5 years. Colonoscopy in 2017 w/ adenomatous polyps per Dr Earlean Shawl recc  5 yr f/u in 2022.      HTN predates circa 1995. Patient's BP has been controlled at home and patient denies any cardiac symptoms as chest pain, palpitations, shortness of breath, dizziness or ankle swelling. Today's BP is at goal - 122/78.      Patient's hyperlipidemia is controlled with diet and medications. Patient denies myalgias or other medication SE's. 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 Patient denies reactive hypoglycemic symptoms, visual blurring, diabetic polys, or paresthesias. Altho she alleges CBG's are less than 110 mg%, her last  A1c was not at goal: Lab Results  Component Value Date   HGBA1C 6.6 (H) 01/06/2017      Patient has been on thyroid replacement since 2003. Finally, patient  has history of Vitamin D Deficiency and last Vitamin D was at goal: Lab Results  Component Value Date   VD25OH 47 01/06/2017   Current Outpatient Prescriptions on File Prior to Visit  Medication Sig  . acetaminophen (TYLENOL) 500 MG tablet Take 500 mg by mouth daily as needed for moderate pain or headache.  Marland Kitchen aspirin 81 MG tablet Take 81 mg by mouth daily.    . Calcium Carbonate-Vitamin D (CALCIUM 600 + D PO) Take 1 tablet by mouth 2 (two) times daily.   . Cholecalciferol (VITAMIN D) 2000 units CAPS Take 2,000 Units by mouth every other day.  Marland Kitchen dexamethasone (DECADRON) 4 MG tablet Take 5 tablets (20mg ) with food 12 hours and 6 hours before taxol.  . levothyroxine (SYNTHROID, LEVOTHROID) 88 MCG tablet Take 1 tablet (88 mcg total) by mouth daily before breakfast.  . loratadine-pseudoephedrine (CLARITIN-D 24 HOUR) 10-240 MG 24 hr tablet Take 1 tablet by mouth daily as needed for allergies.  Marland Kitchen LORazepam (ATIVAN) 0.5 MG tablet Take 1 tablet (0.5 mg total) by mouth every 6 (six) hours as needed for anxiety.  . metFORMIN (GLUCOPHAGE-XR) 500 MG 24 hr tablet TAKE ONE TABLET BY MOUTH WITH BREAKFAST AND LUNCH AND TAKE TWO TABLETS BY MOUTH WITH SUPPER FOR DAIBETES. (Patient taking differently: takes one tablet with supper)  . Multiple Vitamins-Minerals (MULTIVITAMIN PO) Take 1 tablet by mouth  daily.   . pravastatin (PRAVACHOL) 40 MG tablet Take 10 mg by mouth every evening.   . triamterene-hydrochlorothiazide (MAXZIDE) 75-50 MG tablet TAKE ONE TABLET BY MOUTH ONCE DAILY FOR BLOOD PRESSURE AND  FLUID (Patient taking differently: TAKE 1/2 TABLET BY MOUTH ONCE DAILY FOR BLOOD PRESSURE AND  FLUID)   No current facility-administered medications on file prior to visit.    Allergies  Allergen Reactions  . Calan [Verapamil]     Constipation   . Effexor [Venlafaxine]     unknown  . Erythromycin Nausea And Vomiting  . Femara [Letrozole] Swelling  . Fosamax [Alendronate]     aching  . Ibuprofen      Dizziness, incoherent   . Paxil [Paroxetine Hcl] Nausea Only  . Remeron [Mirtazapine]     Weight gain   . Tamoxifen     Hair loss @ low dose  . Vasotec [Enalapril] Cough   Past Medical History:  Diagnosis Date  . Depression   . Diabetes ([place])    borderline  . Family history of cancer   . History of brachytherapy 03/29/17-04/12/17   vaginal cuff 18 Gy in 3 fractions  . History of radiation therapy 02/10/17-03/16/17   pelvis 45 Gy in 25 fractions  . Hyperlipidemia   . Hypertension   . Iron deficiency anemia   . Vitamin D deficiency    Health Maintenance  Topic Date Due  . PNA vac Low Risk Adult (2 of 2 - PCV13) 12/28/2012  . FOOT EXAM  04/02/2017  . URINE MICROALBUMIN  04/02/2017  . INFLUENZA VACCINE  06/23/2017  . HEMOGLOBIN A1C  07/06/2017  . OPHTHALMOLOGY EXAM  01/30/202018  . TETANUS/TDAP  12/28/2021  . COLONOSCOPY  03/10/2026  . DEXA SCAN  Completed   Immunization History  Administered Date(s) Administered  . Influenza,inj,Quad PF,36+ Mos 11/12/2016  . Pneumococcal-Unspecified 12/29/2011  . Td 12/29/2011   Past Surgical History:  Procedure Laterality Date  . BREAST SURGERY  2006   left breast lumpectomy- radiation, took Femara   . 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 ROBOTIC ASSISTED LAP VAGINAL HYSTERECTOMY N/A 10/13/2016   Procedure: XI ROBOTIC ASSISTED LAPAROSCOPIC VAGINAL HYSTERECTOMY;  Surgeon: Nancy Marus, MD;  Location: [facility];  Service: Gynecology;  Laterality: N/A;  . ROBOTIC ASSISTED SALPINGO OOPHERECTOMY Bilateral 10/13/2016   Procedure: XI ROBOTIC ASSISTED SALPINGO OOPHORECTOMY;  Surgeon: Nancy Marus, MD;  Location: [facility];  Service: Gynecology;  Laterality: Bilateral;  . SENTINEL NODE BIOPSY N/A 10/13/2016   Procedure: SENTINEL NODE BIOPSY;  Surgeon: Nancy Marus, MD;  Location: [facility];  Service: Gynecology;  Laterality: N/A;   Family History  Problem Relation Age of  Onset  . Heart disease Mother   . Heart attack Mother   . Cancer Father   . Heart attack Father   . Prostate cancer Father 42       deceased 37  . Breast cancer Paternal Aunt 26       deceased 57s  . Prostate cancer Paternal Uncle 79       deceased 59  . Cancer Paternal Grandmother        unsure; deceased 87s  . Prostate cancer Brother 39       currently 24  . Prostate cancer Brother 90       currently 47   Social History  Substance Use Topics  . Smoking status: Never Smoker  . Smokeless tobacco:  Never Used  . Alcohol use No    ROS Constitutional: Denies fever, chills, weight loss/gain, headaches, insomnia,  night sweats, and change in appetite. Does c/o fatigue. Eyes: Denies redness, blurred vision, diplopia, discharge, itchy, watery eyes.  ENT: Denies discharge, congestion, post nasal drip, epistaxis, sore throat, earache, hearing loss, dental pain, Tinnitus, Vertigo, Sinus pain, snoring.  Cardio: Denies chest pain, palpitations, irregular heartbeat, syncope, dyspnea, diaphoresis, orthopnea, PND, claudication, edema Respiratory: denies cough, dyspnea, DOE, pleurisy, hoarseness, laryngitis, wheezing.  Gastrointestinal: Denies dysphagia, heartburn, reflux, water brash, pain, cramps, nausea, vomiting, bloating, diarrhea, constipation, hematemesis, melena, hematochezia, jaundice, hemorrhoids Genitourinary: Denies dysuria, frequency, urgency, nocturia, hesitancy, discharge, hematuria, flank pain Breast: Breast lumps, nipple discharge, bleeding.  Musculoskeletal: Denies arthralgia, myalgia, stiffness, Jt. Swelling, pain, limp, and strain/sprain. Denies falls. Skin: Denies puritis, rash, hives, warts, acne, eczema, changing in skin lesion Neuro: No weakness, tremor, incoordination, spasms, paresthesia, pain Psychiatric: Denies confusion, memory loss, sensory loss. Denies Depression. Endocrine: Denies change in weight, skin, hair change, nocturia, and paresthesia, diabetic polys,  visual blurring, hyper / hypo glycemic episodes.  Heme/Lymph: No excessive bleeding, bruising, enlarged lymph nodes.  Physical Exam  BP 122/78   Pulse 88   Temp 97.8 F (36.6 C)   Resp 16   Ht 5\' 3"  (1.6 m)   Wt 145 lb (65.8 kg)   BMI 25.69 kg/m   General Appearance: Well nourished, well groomed and in no apparent distress.  Eyes: PERRLA, EOMs, conjunctiva no swelling or erythema, normal fundi and vessels. Sinuses: No frontal/maxillary tenderness ENT/Mouth: EACs patent / TMs  nl. Nares clear without erythema, swelling, mucoid exudates. Oral hygiene is good. No erythema, swelling, or exudate. Tongue normal, non-obstructing. Tonsils not swollen or erythematous. Hearing normal.  Neck: Supple, thyroid normal. No bruits, nodes or JVD. Respiratory: Respiratory effort normal.  BS equal and clear bilateral without rales, rhonci, wheezing or stridor. Cardio: Heart sounds are normal with regular rate and rhythm and no murmurs, rubs or gallops. Peripheral pulses are normal and equal bilaterally without edema. No aortic or femoral bruits. Chest: symmetric with normal excursions and percussion. Breasts: Deferred. Abdomen: Flat, soft with bowel sounds active. Nontender, no guarding, rebound, hernias, masses, or organomegaly.  Lymphatics: Non tender without lymphadenopathy.  Musculoskeletal: Full ROM all peripheral extremities, joint stability, 5/5 strength, and normal gait. Skin: Warm and dry without rashes, lesions, cyanosis, clubbing or  ecchymosis.  Neuro: Cranial nerves intact, reflexes equal bilaterally. Normal muscle tone, no cerebellar symptoms. Sensation intact to touch, vibratory and Monofilament to the toes bilaterally. Pysch: Alert and oriented X 3, normal affect, Insight and Judgment appropriate.   Assessment and Plan  1. Annual Preventative Screening Examination   2. Essential hypertension  - EKG 12-Lead - Urinalysis, Routine w reflex microscopic - Microalbumin / creatinine  urine ratio - Magnesium - TSH  3. Hyperlipidemia, mixed  - EKG 12-Lead - Lipid panel - TSH  4. Type 2 diabetes mellitus with stage 2 chronic kidney disease, without long-term current use of insulin (HCC)  - EKG 12-Lead - HM DIABETES FOOT EXAM - LOW EXTREMITY NEUR EXAM DOCUM - Hemoglobin A1c - Insulin, random  5. Vitamin D deficiency  - VITAMIN D 25 Hydroxy  6. Screening for rectal cancer  - POC Hemoccult Bld/Stl   7. Hypothyroidism  - TSH  8. Screening for ischemic heart disease  - EKG 12-Lead  9. Medication management  - Urinalysis, Routine w reflex microscopic - Microalbumin / creatinine urine ratio - HM DIABETES FOOT EXAM - LOW  EXTREMITY NEUR EXAM DOCUM - Magnesium - Lipid panel - TSH - Hemoglobin A1c - Insulin, random - VITAMIN D 25 Hydroxy        Patient was counseled in prudent diet to achieve/maintain BMI less than 25 for weight control, BP monitoring, regular exercise and medications. Discussed med's effects and SE's. Screening labs and tests as requested with regular follow-up as recommended. Over 40 minutes of exam, counseling, chart review and high complex critical decision making was performed.

## 2017-05-27 NOTE — Patient Instructions (Signed)

## 2017-05-28 LAB — URINALYSIS, ROUTINE W REFLEX MICROSCOPIC

## 2017-05-28 LAB — HEMOGLOBIN A1C
Hgb A1c MFr Bld: 6.5 % — ABNORMAL HIGH (ref <5.7)
Mean Plasma Glucose: 140 mg/dL

## 2017-05-28 LAB — MICROALBUMIN / CREATININE URINE RATIO

## 2017-05-28 LAB — INSULIN, RANDOM: Insulin: 21.8 u[IU]/mL — ABNORMAL HIGH (ref 2.0–19.6)

## 2017-05-28 LAB — VITAMIN D 25 HYDROXY (VIT D DEFICIENCY, FRACTURES): Vit D, 25-Hydroxy: 70 ng/mL (ref 30–100)

## 2017-05-28 LAB — MAGNESIUM: Magnesium: 1.8 mg/dL (ref 1.5–2.5)

## 2017-05-31 ENCOUNTER — Ambulatory Visit (HOSPITAL_BASED_OUTPATIENT_CLINIC_OR_DEPARTMENT_OTHER): Payer: Medicare Other

## 2017-05-31 ENCOUNTER — Other Ambulatory Visit (HOSPITAL_BASED_OUTPATIENT_CLINIC_OR_DEPARTMENT_OTHER): Payer: Medicare Other

## 2017-05-31 ENCOUNTER — Ambulatory Visit (HOSPITAL_BASED_OUTPATIENT_CLINIC_OR_DEPARTMENT_OTHER): Payer: Medicare Other | Admitting: Hematology and Oncology

## 2017-05-31 DIAGNOSIS — C541 Malignant neoplasm of endometrium: Secondary | ICD-10-CM

## 2017-05-31 DIAGNOSIS — C50412 Malignant neoplasm of upper-outer quadrant of left female breast: Secondary | ICD-10-CM

## 2017-05-31 DIAGNOSIS — Z5111 Encounter for antineoplastic chemotherapy: Secondary | ICD-10-CM

## 2017-05-31 DIAGNOSIS — I10 Essential (primary) hypertension: Secondary | ICD-10-CM

## 2017-05-31 DIAGNOSIS — Z5189 Encounter for other specified aftercare: Secondary | ICD-10-CM

## 2017-05-31 DIAGNOSIS — Z170 Estrogen receptor positive status [ER+]: Secondary | ICD-10-CM

## 2017-05-31 DIAGNOSIS — Z853 Personal history of malignant neoplasm of breast: Secondary | ICD-10-CM | POA: Diagnosis not present

## 2017-05-31 LAB — CBC WITH DIFFERENTIAL/PLATELET
BASO%: 0.3 % (ref 0.0–2.0)
BASOS ABS: 0 10*3/uL (ref 0.0–0.1)
EOS ABS: 0 10*3/uL (ref 0.0–0.5)
EOS%: 0.1 % (ref 0.0–7.0)
HEMATOCRIT: 38.7 % (ref 34.8–46.6)
HEMOGLOBIN: 12.9 g/dL (ref 11.6–15.9)
LYMPH#: 0.5 10*3/uL — AB (ref 0.9–3.3)
LYMPH%: 7.2 % — ABNORMAL LOW (ref 14.0–49.7)
MCH: 26.9 pg (ref 25.1–34.0)
MCHC: 33.3 g/dL (ref 31.5–36.0)
MCV: 80.7 fL (ref 79.5–101.0)
MONO#: 0.1 10*3/uL (ref 0.1–0.9)
MONO%: 0.9 % (ref 0.0–14.0)
NEUT#: 6 10*3/uL (ref 1.5–6.5)
NEUT%: 91.5 % — AB (ref 38.4–76.8)
PLATELETS: 323 10*3/uL (ref 145–400)
RBC: 4.8 10*6/uL (ref 3.70–5.45)
RDW: 15.6 % — AB (ref 11.2–14.5)
WBC: 6.5 10*3/uL (ref 3.9–10.3)

## 2017-05-31 LAB — COMPREHENSIVE METABOLIC PANEL
ALBUMIN: 4.2 g/dL (ref 3.5–5.0)
ALK PHOS: 91 U/L (ref 40–150)
ALT: 15 U/L (ref 0–55)
ANION GAP: 14 meq/L — AB (ref 3–11)
AST: 16 U/L (ref 5–34)
BUN: 10.9 mg/dL (ref 7.0–26.0)
CO2: 25 mEq/L (ref 22–29)
Calcium: 10.6 mg/dL — ABNORMAL HIGH (ref 8.4–10.4)
Chloride: 98 mEq/L (ref 98–109)
Creatinine: 0.8 mg/dL (ref 0.6–1.1)
EGFR: 88 mL/min/{1.73_m2} — AB (ref >90)
Glucose: 161 mg/dl — ABNORMAL HIGH (ref 70–140)
POTASSIUM: 3.5 meq/L (ref 3.5–5.1)
Sodium: 137 mEq/L (ref 136–145)
Total Bilirubin: 0.33 mg/dL (ref 0.20–1.20)
Total Protein: 7.5 g/dL (ref 6.4–8.3)

## 2017-05-31 MED ORDER — DIPHENHYDRAMINE HCL 50 MG/ML IJ SOLN: 25.0000 mg | Freq: Once | INTRAMUSCULAR | Status: DC

## 2017-05-31 MED ORDER — SODIUM CHLORIDE 0.9 % IV SOLN
Freq: Once | INTRAVENOUS | Status: AC
  Administered 2017-05-31: 11:00:00 via INTRAVENOUS

## 2017-05-31 MED ORDER — SODIUM CHLORIDE 0.9 % IV SOLN
175.0000 mg/m2 | Freq: Once | INTRAVENOUS | Status: AC
  Administered 2017-05-31: 300 mg via INTRAVENOUS
  Filled 2017-05-31: qty 50

## 2017-05-31 MED ORDER — ONDANSETRON HCL 4 MG/2ML IJ SOLN
INTRAMUSCULAR | Status: AC
  Filled 2017-05-31: qty 4

## 2017-05-31 MED ORDER — SODIUM CHLORIDE 0.9 % IV SOLN
25.0000 mg | Freq: Once | INTRAVENOUS | Status: AC
  Administered 2017-05-31: 25 mg via INTRAVENOUS
  Filled 2017-05-31: qty 0.5

## 2017-05-31 MED ORDER — SODIUM CHLORIDE 0.9 % IV SOLN
450.0000 mg | Freq: Once | INTRAVENOUS | Status: AC
  Administered 2017-05-31: 450 mg via INTRAVENOUS
  Filled 2017-05-31: qty 45

## 2017-05-31 MED ORDER — PEGFILGRASTIM 6 MG/0.6ML ~~LOC~~ PSKT
6.0000 mg | PREFILLED_SYRINGE | Freq: Once | SUBCUTANEOUS | Status: AC
  Administered 2017-05-31: 6 mg via SUBCUTANEOUS
  Filled 2017-05-31: qty 0.6

## 2017-05-31 MED ORDER — ONDANSETRON HCL 4 MG/2ML IJ SOLN
8.0000 mg | Freq: Once | INTRAMUSCULAR | Status: AC
  Administered 2017-05-31: 8 mg via INTRAVENOUS

## 2017-05-31 MED ORDER — FAMOTIDINE IN NACL 20-0.9 MG/50ML-% IV SOLN
20.0000 mg | Freq: Once | INTRAVENOUS | Status: AC
  Administered 2017-05-31: 20 mg via INTRAVENOUS

## 2017-05-31 MED ORDER — FAMOTIDINE IN NACL 20-0.9 MG/50ML-% IV SOLN
INTRAVENOUS | Status: AC
  Filled 2017-05-31: qty 50

## 2017-05-31 MED ORDER — SODIUM CHLORIDE 0.9 % IV SOLN
Freq: Once | INTRAVENOUS | Status: AC
  Administered 2017-05-31: 12:00:00 via INTRAVENOUS
  Filled 2017-05-31: qty 5

## 2017-05-31 NOTE — Patient Instructions (Signed)
Cancer Center Discharge Instructions for Patients Receiving Chemotherapy  Today you received the following chemotherapy agents Taxol and Carboplatin. To help prevent nausea and vomiting after your treatment, we encourage you to take your nausea medication as directed.  If you develop nausea and vomiting that is not controlled by your nausea medication, call the clinic.   BELOW ARE SYMPTOMS THAT SHOULD BE REPORTED IMMEDIATELY:  *FEVER GREATER THAN 100.5 F  *CHILLS WITH OR WITHOUT FEVER  NAUSEA AND VOMITING THAT IS NOT CONTROLLED WITH YOUR NAUSEA MEDICATION  *UNUSUAL SHORTNESS OF BREATH  *UNUSUAL BRUISING OR BLEEDING  TENDERNESS IN MOUTH AND THROAT WITH OR WITHOUT PRESENCE OF ULCERS  *URINARY PROBLEMS  *BOWEL PROBLEMS  UNUSUAL RASH Items with * indicate a potential emergency and should be followed up as soon as possible.  Feel free to call the clinic you have any questions or concerns. The clinic phone number is [phone].  Please show the CHEMO ALERT CARD at check-in to the Emergency Department and triage nurse.    

## 2017-06-01 ENCOUNTER — Encounter: Payer: Self-pay | Admitting: Hematology and Oncology

## 2017-06-01 NOTE — Assessment & Plan Note (Addendum)
Overall, she tolerated chemotherapy very well with virtually no side effects We will proceed with final treatment In the future, she had various appointment for follow-up with radiation oncologist and GYN oncologist I will see her back once a year with history, physical examination and blood work We discussed the role of antiestrogen therapy in the setting of disease relapse

## 2017-06-01 NOTE — Assessment & Plan Note (Signed)
she will continue current medical management. She felt that her blood pressure is elevated today due to anxiety and side effects from steroids I recommend close follow-up with primary care doctor for medication adjustment.

## 2017-06-01 NOTE — Assessment & Plan Note (Signed)
She has remote history of breast cancer. Continue screening program after she completes her treatment for uterine cancer Recent genetic testing was negative We discussed the role of antiestrogen therapy as a form of preventive strategy for breast cancer, which may also confer protective benefits to prevent relapse of endometroid uterine cancer.

## 2017-06-01 NOTE — Progress Notes (Signed)
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 SEE COMMENT.  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 The greatest extent of invasive carcinoma as measured on the needle core biopsy measures 0.6 cm.         Endometrial cancer ([place])   02/03/2002 Pathology Results    1. BENIGN ENDOMETRIAL POLYPS.  2. UTERINE FIBROIDS: PORTIONS OF SMOOTH MUSCLE CONSISTENT WITH BENIGN LEIOMYOMA(S). PORTIONS OF BENIGN, CYSTICALLY ATROPHIC ENDOMETRIUM WITHOUT HYPERPLASIA OR EVIDENCE OF MALIGNANCY. 3. ENDOMETRIAL CURETTAGE: BENIGN ENDOMETRIUM AND SMOOTH MUSCLE.      09/07/2016 Pathology Results    PAP smear positive for malignant cells      09/07/2016 Initial Diagnosis    Patient had postmenopausal bleeding in 08-2016. She had PAP 09-07-16 by PCP Dr [name], which documented carcinoma. She had evaluation by Dr [name] with colposcopy/ ECC/endometrial biopsy documenting serous endometrioid endometrial carcinoma.      09/15/2016 Imaging    Ct imaging showed markedly thickened and heterogeneous endometrial stripe suspicious for endometrial carcinoma in this patient with postmenopausal vaginal bleeding. Cystic lesion in the right ovary cannot be definitively characterized. Pelvic ultrasound is recommended for further evaluation. Aortoiliac atherosclerosis. Avascular necrosis of  the femoral heads bilaterally.      10/07/2016 Tumor Marker    Patient's tumor was tested for the following markers: CA125 Results of the tumor marker test revealed 15.1      10/13/2016 Pathology Results    1. Lymph node, sentinel, biopsy, right obturator - THERE IS NO EVIDENCE OF CARCINOMA IN 8 OF 8 LYMPH NODES (0/8). - SEE COMMENT. 2. Lymph node, sentinel, biopsy, left obturator - THERE IS NO EVIDENCE OF CARCINOMA IN 1 OF 1 LYMPH NODE (0/1). - SEE COMMENT. 3. Lymph node, sentinel, biopsy, left para-aortic - THERE IS NO EVIDENCE OF CARCINOMA IN 4 OF 4 LYMPH NODES (0/4). - SEE COMMENT. 4. Uterus +/- tubes/ovaries, neoplastic, cervix - INVASIVE MIXED SEROUS/ENDOMETRIOID ADENOCARCINOMA, MULTIPLE FOCI, FIGO GRADE III, THE LARGEST FOCUS SPANS 8.2 CM. - ADENOCARCINOMA EXTENDS INTO THE OUTER HALF OF THE MYOMETRIUM AND INVOLVES THE STROMA OF THE LOWER UTERINE SEGMENT AND CERVIX. - LYMPHOVASCULAR INVASION IS IDENTIFIED. - THE SURGICAL RESECTION MARGINS ARE NEGATIVE FOR CARCINOMA. - SEE ONCOLOGY TABLE BELOW. ADDITIONAL FINDINGS: - ENDOMETRIUM: ENDOMETRIOID TYPE POLYP(S), WHICH CONTAIN FOCI OF SIMILAR APPEARING MIXED SEROUS/ENDOMETRIOID ADENOCARCINOMA. - MYOMETRIUM: LEIOMYOMATA. - SEROSA: UNREMARKABLE. - BILATERAL ADNEXA: BENIGN OVARIES AND FALLOPIAN TUBES.      10/13/2016 Surgery    Surgery: Total robotic hysterectomy bilateral salpingo-oophorectomy, bilateral pelvic sentinel lymph node removal, left PA sentinel lymph node removal. Mini-laparotomy for specimen removal Surgeons:  [name], MD; [name], MD  Pathology:  1)Uterus, cervix, bilateral tubes and ovaries 2) Right obturator SLN 3) Left Obturator SLN 4) Left PA SLN Operative findings: 14 week fibroid uterus with one large dominant 6 cm myoma that was palpably calcified. 4 cm right ovarian cyst. + SLN identified in the bilateral obturator spaces, + SLN identified in the left  PA space.       12/01/2016 -  05/31/2017 Chemotherapy    She received carboplatin & Taxol. She had 3 cycles of chemo followed by radiation and then 3 more cycles of chemo      01/11/2017 Genetic Testing    Testing was normal and did not reveal a mutation in these genes: The genes tested were the 80 genes on [employer]'s Multi-Cancer panel (ALK, APC, ATM, AXIN2, BAP1, BARD1, BLM, BMPR1A, BRCA1, BRCA2, BRIP1, CASR, CDC73, CDH1, CDK4, CDKN1B, CDKN1C, CDKN2A, CEBPA, CHEK2, DICER1, DIS3L2, EGFR, EPCAM, FH, FLCN, GATA2, GPC3, GREM1, HOXB13,  HRAS, KIT, MAX, MEN1, MET, MITF, MLH1, MSH2, MSH6, MUTYH, NBN, NF1, NF2, PALB2, PDGFRA, PHOX2B, PMS2, POLD1, POLE, POT1, PRKAR1A, PTCH1, PTEN, RAD50, RAD51C, RAD51D, RB1, RECQL4, RET, RUNX1, SDHA, SDHAF2, SDHB, SDHC, SDHD, SMAD4, SMARCA4, SMARCB1, SMARCE1, STK11, SUFU, TERC, TERT, TMEM127, TP53, TSC1, TSC2, VHL, WRN, and WT1).      02/10/2017 - 04/12/2017 Radiation Therapy    02/10/17-03/16/17; 03/29/17-04/12/17;  1) Pelvis/ 45 Gy in 25 fractions  2) Vaginal Cuff/ 18 Gy in 3 fractions       INTERVAL HISTORY: Please see below for problem oriented charting. She returns for further follow-up She will get her last cycle of treatment She denies peripheral neuropathy, nausea or changes in bowel habits  REVIEW OF SYSTEMS:   Constitutional: Denies fevers, chills or abnormal weight loss Eyes: Denies blurriness of vision Ears, nose, mouth, throat, and face: Denies mucositis or sore throat Respiratory: Denies cough, dyspnea or wheezes Cardiovascular: Denies palpitation, chest discomfort or lower extremity swelling Gastrointestinal:  Denies nausea, heartburn or change in bowel habits Skin: Denies abnormal skin rashes Lymphatics: Denies new lymphadenopathy or easy bruising Neurological:Denies numbness, tingling or new weaknesses Behavioral/Psych: Mood is stable, no new changes  All other systems were reviewed with the patient and are negative.  I have reviewed the past medical history, past  surgical history, social history and family history with the patient and they are unchanged from previous note.  ALLERGIES:  is allergic to calan [verapamil]; effexor [venlafaxine]; erythromycin; femara [letrozole]; fosamax [alendronate]; ibuprofen; paxil [paroxetine hcl]; remeron [mirtazapine]; tamoxifen; and vasotec [enalapril].  MEDICATIONS:  Current Outpatient Prescriptions  Medication Sig Dispense Refill  . acetaminophen (TYLENOL) 500 MG tablet Take 500 mg by mouth daily as needed for moderate pain or headache.    Marland Kitchen aspirin 81 MG tablet Take 81 mg by mouth daily.      . Calcium Carbonate-Vitamin D (CALCIUM 600 + D PO) Take 1 tablet by mouth 2 (two) times daily.     . Cholecalciferol (VITAMIN D) 2000 units CAPS Take 2,000 Units by mouth every other day.    Marland Kitchen dexamethasone (DECADRON) 4 MG tablet Take 5 tablets ('20mg'$ ) with food 12 hours and 6 hours before taxol. 20 tablet 0  . levothyroxine (SYNTHROID, LEVOTHROID) 88 MCG tablet Take 1 tablet (88 mcg total) by mouth daily before breakfast. 90 tablet 1  . loratadine-pseudoephedrine (CLARITIN-D 24 HOUR) 10-240 MG 24 hr tablet Take 1 tablet by mouth daily as needed for allergies.    Marland Kitchen LORazepam (ATIVAN) 0.5 MG tablet Take 1 tablet (0.5 mg total) by mouth every 6 (six) hours as needed for anxiety. 30 tablet 0  . metFORMIN (GLUCOPHAGE-XR) 500 MG 24 hr tablet TAKE ONE TABLET BY MOUTH WITH BREAKFAST AND LUNCH AND TAKE TWO TABLETS BY MOUTH WITH SUPPER FOR DAIBETES. (Patient taking differently: takes one tablet with supper) 360 tablet 1  . Multiple Vitamins-Minerals (MULTIVITAMIN PO) Take 1 tablet by mouth  daily.     . pravastatin (PRAVACHOL) 40 MG tablet Take 10 mg by mouth every evening.     . triamterene-hydrochlorothiazide (MAXZIDE) 75-50 MG tablet TAKE ONE TABLET BY MOUTH ONCE DAILY FOR BLOOD PRESSURE AND  FLUID (Patient taking differently: TAKE 1/2 TABLET BY MOUTH ONCE DAILY FOR BLOOD PRESSURE AND  FLUID) 90 tablet 3   No current  facility-administered medications for this visit.     PHYSICAL EXAMINATION: ECOG PERFORMANCE STATUS: 0 - Asymptomatic  Vitals:   05/31/17 0953  BP: (!) 157/74  Pulse: 86  Resp: 18  Temp: 98.1 F (36.7 C)   Filed Weights   05/31/17 0953  Weight: 147 lb (66.7 kg)    GENERAL:alert, no distress and comfortable SKIN: skin color, texture, turgor are normal, no rashes or significant lesions EYES: normal, Conjunctiva are pink and non-injected, sclera clear OROPHARYNX:no exudate, no erythema and lips, buccal mucosa, and tongue normal  NECK: supple, thyroid normal size, non-tender, without nodularity LYMPH:  no palpable lymphadenopathy in the cervical, axillary or inguinal LUNGS: clear to auscultation and percussion with normal breathing effort HEART: regular rate & rhythm and no murmurs and no lower extremity edema ABDOMEN:abdomen soft, non-tender and normal bowel sounds Musculoskeletal:no cyanosis of digits and no clubbing  NEURO: alert & oriented x 3 with fluent speech, no focal motor/sensory deficits  LABORATORY DATA:  I have reviewed the data as listed    Component Value Date/Time   NA 137 05/31/2017 0931   K 3.5 05/31/2017 0931   CL 98 (L) 10/14/2016 0444   CL 99 12/20/2012 1336   CO2 25 05/31/2017 0931   GLUCOSE 161 (H) 05/31/2017 0931   GLUCOSE 110 (H) 12/20/2012 1336   BUN 10.9 05/31/2017 0931   CREATININE 0.8 05/31/2017 0931   CALCIUM 10.6 (H) 05/31/2017 0931   PROT 7.5 05/31/2017 0931   ALBUMIN 4.2 05/31/2017 0931   AST 16 05/31/2017 0931   ALT 15 05/31/2017 0931   ALKPHOS 91 05/31/2017 0931   BILITOT 0.33 05/31/2017 0931   GFRNONAA >60 10/14/2016 0444   GFRNONAA 85 07/09/2016 0950   GFRAA >60 10/14/2016 0444   GFRAA >89 07/09/2016 0950    No results found for: SPEP, UPEP  Lab Results  Component Value Date   WBC 6.5 05/31/2017   NEUTROABS 6.0 05/31/2017   HGB 12.9 05/31/2017   HCT 38.7 05/31/2017   MCV 80.7 05/31/2017   PLT 323 05/31/2017       Chemistry      Component Value Date/Time   NA 137 05/31/2017 0931   K 3.5 05/31/2017 0931   CL 98 (L) 10/14/2016 0444   CL 99 12/20/2012 1336   CO2 25 05/31/2017 0931   BUN 10.9 05/31/2017 0931   CREATININE 0.8 05/31/2017 0931      Component Value Date/Time   CALCIUM 10.6 (H) 05/31/2017 0931   ALKPHOS 91 05/31/2017 0931   AST 16 05/31/2017 0931   ALT 15 05/31/2017 0931   BILITOT 0.33 05/31/2017 0931       ASSESSMENT & PLAN:  Endometrial cancer ([facility]) Overall, she tolerated chemotherapy very well with virtually no side effects We will proceed with final treatment In the future, she had various appointment for follow-up with radiation oncologist and GYN oncologist I will see her back once a year with history, physical examination and blood work We discussed the role of antiestrogen therapy in the setting of disease relapse   Breast cancer, 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  Continue screening program after she completes her treatment for uterine cancer Recent genetic testing was negative We discussed the role of antiestrogen therapy as a form of preventive strategy for breast cancer, which may also confer protective benefits to prevent relapse of endometroid uterine cancer.  Hypertension she will continue current medical management. She felt that her blood pressure is elevated today due to anxiety and side effects from steroids I recommend close follow-up with primary care doctor for medication adjustment.    No orders of the defined types were placed in this encounter.  All questions were answered. The patient knows to call the clinic with any problems, questions or concerns. No barriers to learning was detected. I spent 15 minutes counseling the patient face to face. The total time spent in the appointment was 20 minutes and more than 50% was on counseling and review of test results     [name], MD 06/01/2017 7:22 AM

## 2017-06-22 ENCOUNTER — Other Ambulatory Visit: Payer: Self-pay | Admitting: *Deleted

## 2017-06-22 DIAGNOSIS — Z1212 Encounter for screening for malignant neoplasm of rectum: Secondary | ICD-10-CM

## 2017-06-22 LAB — POC HEMOCCULT BLD/STL (HOME/3-CARD/SCREEN)
FECAL OCCULT BLD: NEGATIVE
FECAL OCCULT BLD: NEGATIVE
Fecal Occult Blood, POC: NEGATIVE

## 2017-08-16 ENCOUNTER — Encounter: Payer: Self-pay | Admitting: Radiation Oncology

## 2017-08-16 ENCOUNTER — Ambulatory Visit
Admission: RE | Admit: 2017-08-16 | Discharge: 2017-08-16 | Disposition: A | Discharge status: Home / Self Care | Payer: Medicare Other | Source: Ambulatory Visit | Attending: Radiation Oncology | Admitting: Radiation Oncology

## 2017-08-16 DIAGNOSIS — Z923 Personal history of irradiation: Secondary | ICD-10-CM | POA: Insufficient documentation

## 2017-08-16 DIAGNOSIS — C541 Malignant neoplasm of endometrium: Secondary | ICD-10-CM | POA: Insufficient documentation

## 2017-08-16 DIAGNOSIS — Z9221 Personal history of antineoplastic chemotherapy: Secondary | ICD-10-CM | POA: Diagnosis not present

## 2017-08-16 NOTE — Progress Notes (Signed)
Radiation Oncology         [phone]) [phone] ________________________________  Name: Tammy Hensley MRN: [id number]  Date: 08/16/2017  DOB: [birth year]  Follow-Up Visit Note  CC: [name], MD  [name], MD    ICD-10-CM   1. 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 Pt states that she has been doing well overall. Pt notes that she has been using her vaginal dilator without any pain or bleeding. She reports that she was recently evaluated by Dr. Alycia Rossetti in June with a good report. Pt has a follow up appointment with Dr. Alycia Rossetti on October 24th. Pt reports that her husband recently suffered a right sided TIA and is ambulating with a walker with residual right sided weakness. Pt notes that her husband is at home now.   On review of systems, pt denies nausea, vomiting, abdominal cramping, vaginal bleeding with vaginal dilator use, or change in bowel or bladder habits.   ALLERGIES:  is allergic to calan [verapamil]; effexor [venlafaxine]; erythromycin; femara [letrozole]; fosamax [alendronate]; ibuprofen; paxil [paroxetine hcl]; remeron [mirtazapine]; tamoxifen; and vasotec [enalapril].  Meds: Current Outpatient Prescriptions  Medication Sig Dispense Refill  . acetaminophen (TYLENOL) 500 MG tablet Take 500 mg by mouth daily as needed for moderate pain or headache.    Marland Kitchen aspirin 81 MG tablet Take 81 mg by mouth daily.      . Calcium Carbonate-Vitamin D (CALCIUM 600 + D PO) Take 1 tablet by mouth 2 (two) times daily.     . Cholecalciferol (VITAMIN D) 2000 units CAPS Take 2,000 Units by mouth every other day.    . levothyroxine (SYNTHROID, LEVOTHROID) 88  MCG tablet Take 1 tablet (88 mcg total) by mouth daily before breakfast. 90 tablet 1  . loratadine-pseudoephedrine (CLARITIN-D 24 HOUR) 10-240 MG 24 hr tablet Take 1 tablet by mouth daily as needed for allergies.    Marland Kitchen LORazepam (ATIVAN) 0.5 MG tablet Take 1 tablet (0.5 mg total) by mouth every 6 (six) hours as needed for anxiety. 30 tablet 0  . metFORMIN (GLUCOPHAGE-XR) 500 MG 24 hr tablet TAKE ONE TABLET BY MOUTH WITH BREAKFAST AND LUNCH AND TAKE TWO TABLETS BY MOUTH WITH SUPPER FOR DAIBETES. (Patient taking differently: takes one tablet with supper) 360 tablet 1  . Multiple Vitamins-Minerals (MULTIVITAMIN PO) Take 1 tablet by mouth daily.     Marland Kitchen triamterene-hydrochlorothiazide (MAXZIDE) 75-50 MG tablet TAKE ONE TABLET BY MOUTH ONCE DAILY FOR BLOOD PRESSURE AND  FLUID (Patient taking differently: TAKE 1/2 TABLET BY MOUTH ONCE DAILY FOR BLOOD PRESSURE AND  FLUID) 90 tablet 3  . pravastatin (PRAVACHOL) 40 MG tablet Take 1 tablet (40 mg total) by mouth every evening. 30 tablet 1   No current facility-administered medications for this encounter.     Physical Findings: The patient is in no acute distress. Patient is alert and oriented.  height is 5\' 3"  (1.6 m) and weight is 145 lb (65.8 kg). Her oral temperature is 98.9 F (37.2 C). Her blood pressure is 135/70 and her pulse is 78. Her oxygen saturation is 100%. .  No significant changes. Lungs are clear to auscultation bilaterally. Heart has regular rate and rhythm. No palpable  cervical, supraclavicular, or axillary adenopathy. Abdomen soft, non-tender, normal bowel sounds. On pelvic examination the external genitalia were unremarkable. A speculum exam was performed. There are no mucosal lesions noted in the vaginal vault. On bimanual and rectovaginal examination there were no pelvic masses appreciated.     Lab Findings: Lab Results  Component Value Date   WBC 6.5 05/31/2017   HGB 12.9 05/31/2017   HCT 38.7 05/31/2017   MCV 80.7 05/31/2017   PLT  323 05/31/2017    Radiographic Findings: No results found.  Impression:  The patient is recovering from the effects of radiation.  No evidence of recurrence on clinical exam.  Plan:  Patient will follow up in early February approximately 3 months after appointment with Dr. Alycia Rossetti.   -----------------------------------  Blair Promise, PhD, MD  This document serves as a record of services personally performed by [name], MD. It was created on his behalf by [facility], a trained medical scribe. The creation of this record is based on the scribe's personal observations and the provider's statements to them. This document has been checked and approved by the attending provider.

## 2017-08-16 NOTE — Progress Notes (Signed)
Sonal is here for follow up.  She denies having pain.  She reports having occasional urinary urgency.  She denies having any bowel issues or vaginal bleeding/discharge.   She reports having a good energy level.  She is trying to use the vaginal dilator and is also sexually active.  She did mention her husband had a stroke 3 weeks ago so she is taking care of him.  BP 135/70 (BP Location: Right Arm, Patient Position: Sitting)   Pulse 78   Temp 98.9 F (37.2 C) (Oral)   Ht 5\' 3"  (1.6 m)   Wt 145 lb (65.8 kg)   SpO2 100%   BMI 25.69 kg/m    Wt Readings from Last 3 Encounters:  08/16/17 145 lb (65.8 kg)  05/31/17 147 lb (66.7 kg)  05/27/17 145 lb (65.8 kg)

## 2017-08-17 ENCOUNTER — Other Ambulatory Visit: Payer: Self-pay | Admitting: *Deleted

## 2017-08-17 MED ORDER — PRAVASTATIN SODIUM 40 MG PO TABS: 40.0000 mg | ORAL_TABLET | Freq: Every evening | ORAL | 1 refills | Status: DC

## 2017-09-07 ENCOUNTER — Ambulatory Visit (INDEPENDENT_AMBULATORY_CARE_PROVIDER_SITE_OTHER): Payer: Medicare Other | Admitting: Physician Assistant

## 2017-09-07 ENCOUNTER — Encounter: Payer: Self-pay | Admitting: Physician Assistant

## 2017-09-07 VITALS — BP 116/72 | HR 81 | Temp 97.3°F | Resp 14 | Ht 63.0 in | Wt 140.0 lb

## 2017-09-07 DIAGNOSIS — E782 Mixed hyperlipidemia: Secondary | ICD-10-CM | POA: Diagnosis not present

## 2017-09-07 DIAGNOSIS — C541 Malignant neoplasm of endometrium: Secondary | ICD-10-CM

## 2017-09-07 DIAGNOSIS — Z23 Encounter for immunization: Secondary | ICD-10-CM

## 2017-09-07 DIAGNOSIS — Z0000 Encounter for general adult medical examination without abnormal findings: Secondary | ICD-10-CM

## 2017-09-07 DIAGNOSIS — I700 Atherosclerosis of aorta: Secondary | ICD-10-CM | POA: Diagnosis not present

## 2017-09-07 DIAGNOSIS — E559 Vitamin D deficiency, unspecified: Secondary | ICD-10-CM | POA: Diagnosis not present

## 2017-09-07 DIAGNOSIS — I10 Essential (primary) hypertension: Secondary | ICD-10-CM

## 2017-09-07 DIAGNOSIS — M8580 Other specified disorders of bone density and structure, unspecified site: Secondary | ICD-10-CM

## 2017-09-07 DIAGNOSIS — E039 Hypothyroidism, unspecified: Secondary | ICD-10-CM | POA: Diagnosis not present

## 2017-09-07 DIAGNOSIS — E119 Type 2 diabetes mellitus without complications: Secondary | ICD-10-CM | POA: Diagnosis not present

## 2017-09-07 DIAGNOSIS — Z79899 Other long term (current) drug therapy: Secondary | ICD-10-CM | POA: Diagnosis not present

## 2017-09-07 DIAGNOSIS — C50412 Malignant neoplasm of upper-outer quadrant of left female breast: Secondary | ICD-10-CM

## 2017-09-07 DIAGNOSIS — D509 Iron deficiency anemia, unspecified: Secondary | ICD-10-CM | POA: Diagnosis not present

## 2017-09-07 DIAGNOSIS — Z0001 Encounter for general adult medical examination with abnormal findings: Secondary | ICD-10-CM | POA: Diagnosis not present

## 2017-09-07 DIAGNOSIS — G4700 Insomnia, unspecified: Secondary | ICD-10-CM

## 2017-09-07 DIAGNOSIS — R6889 Other general symptoms and signs: Secondary | ICD-10-CM | POA: Diagnosis not present

## 2017-09-07 DIAGNOSIS — Z170 Estrogen receptor positive status [ER+]: Secondary | ICD-10-CM

## 2017-09-07 MED ORDER — LORAZEPAM 0.5 MG PO TABS: ORAL_TABLET | ORAL | 0 refills | Status: DC

## 2017-09-07 NOTE — Patient Instructions (Addendum)
You can take tylenol (500mg ) or tylenol arthritis (650mg ) with the meloxicam/antiinflammatories. The max you can take of tylenol a day is 3000mg  daily, this is a max of 6 pills a day of the regular tyelnol (500mg ) or a max of 4 a day of the tylenol arthritis (650mg ) as long as no other medications you are taking contain tylenol.   Can take tylenol but if the pain continues can refer to orthopedics or can refer to PT  FOLLOW UP WITH EYE DOCTOR  Stretching and range of motion exercises These exercises warm up your muscles and joints and improve the movement and flexibility of your knee. These exercises also help to relieve pain and stiffness. Exercise A: Knee extension, passive 1. Sit with your left / right heel propped on a chair, a coffee table, or a footstool. Do not have anything under your knee to support it. 2. Allow your leg muscles to relax, letting gravity straighten out your knee. You should feel a stretch behind your left / right knee. 3. If told by your health care provider, deepen the stretch by placing a __________ weight on your thigh, just above your kneecap. 4. Hold this position for__________ seconds. Repeat __________ times. Complete this stretch __________ times a day. Exercise B: Knee flexion, passive (supine)  1. Start this exercise in one of these positions: ? Lying on the floor in front of an open doorway with your left / right heel and foot lightly touching the wall. ? Lying on your bed with both of your feet on the wall or headboard. 2. Without using any effort, allow gravity to let your foot slide down the wall slowly until you feel a gentle stretch in the front of your left / right knee,or until your knee reaches the angle that your health care provider tells you. 3. Hold this stretch for __________ seconds. 4. Return the leg to the starting position, using your healthy leg to do the work or to help if needed. Repeat __________ times. Complete this stretch __________  times a day. Strengthening exercises These exercises build strength and endurance in your knee. Endurance is the ability to use your muscles for a long time, even after they get tired. Exercise C: Quadriceps, isometric  1. Lie on your back with your left / right leg extended and your other knee bent. 2. Gradually tense the muscles in the front of your left / right thigh. You should see your kneecap slide up toward your hip or see increased dimpling just above the knee. This motion will push the back of your knee down toward the floor. 3. Hold the muscle as tight as you can without increasing your pain for __________ seconds. 4. Relax the muscles slowly and completely. Repeat __________ times. Complete this exercise __________ times a day. Exercise D: Straight leg raises ( quadriceps) 1. Lie on your back with your left / right leg extended and your other knee bent. 2. Tense the muscles in the front of your left / right thigh. 3. Keep these muscles tight as you raise your leg 4-6 inches (10-15 cm) off the floor. Do not let your knee bend. 4. Hold for __________ seconds. 5. Keep these muscles tense as you lower your leg. 6. Relax the muscles slowly and completely. Repeat __________ times. Complete this exercise __________ times a day. Exercise E: Straight leg raises ( hip abductors) 1. Lie on your side with your left / right leg in the top position. Lie so your head, shoulder,  knee, and hip line up. You may bend your lower knee to help you keep your balance. 2. Lift your top leg 4-6 inches (10-15 cm) while keeping your toes pointed straight ahead. 3. Hold this position for __________ seconds. 4. Slowly lower your leg to the starting position. 5. Let your muscles relax completely. Repeat __________ times. Complete this exercise __________ times a day. Exercise F: Straight leg raises ( hip extensors) 1. Lie on your abdomen on a firm surface. 2. Tense the muscles in your buttocks and lift  your left / right leg about 4 inches (10 cm). Keep your knee straight as you lift your leg. If you cannot lift your leg 4 inches (10 cm) without arching your back, place a pillow under your hips. 3. Hold this position for __________ seconds. 4. Slowly lower your leg to the starting position. 5. Let your leg relax completely. Repeat __________ times. Complete this exercise __________ times a day. This information is not intended to replace advice given to you by your health care provider. Make sure you discuss any questions you have with your health care provider. Document Released: 11/09/2005 Document Revised: 07/16/2016 Document Reviewed: 11/23/2015 Elsevier Interactive Patient Education  2018 [employer].  Can try benadryl or unisom at night for sleep but if this does not help can take xanax 0.5 1/2-1 at night for sleep as needed- this is habit forming and should not be used for more than 2 months  11 Tips to Follow:  1. No caffeine after 3pm: Avoid beverages with caffeine (soda, tea, energy drinks, etc.) especially after 3pm. 2. Don't go to bed hungry: Have your evening meal at least 3 hrs. before going to sleep. It's fine to have a small bedtime snack such as a glass of milk and a few crackers but don't have a big meal. 3. Have a nightly routine before bed: Plan on "winding down" before you go to sleep. Begin relaxing about 1 hour before you go to bed. Try doing a quiet activity such as listening to calming music, reading a book or meditating. 4. Turn off the TV and ALL electronics including video games, tablets, laptops, etc. 1 hour before sleep, and keep them out of the bedroom. 5. Turn off your cell phone and all notifications (new email and text alerts) or even better, leave your phone outside your room while you sleep. Studies have shown that a part of your brain continues to respond to certain lights and sounds even while you're still asleep. 6. Make your bedroom quiet, dark and cool. If  you can't control the noise, try wearing earplugs or using a fan to block out other sounds. 7. Practice relaxation techniques. Try reading a book or meditating or drain your brain by writing a list of what you need to do the next day. 8. Don't nap unless you feel sick: you'll have a better night's sleep. 9. Don't smoke, or quit if you do. Nicotine, alcohol, and marijuana can all keep you awake. Talk to your health care provider if you need help with substance use. 10. Most importantly, wake up at the same time every day (or within 1 hour of your usual wake up time) EVEN on the weekends. A regular wake up time promotes sleep hygiene and prevents sleep problems. 11. Reduce exposure to bright light in the last three hours of the day before going to sleep. Maintaining good sleep hygiene and having good sleep habits lower your risk of developing sleep problems. Getting better  sleep can also improve your concentration and alertness. Try the simple steps in this guide. If you still have trouble getting enough rest, make an appointment with your health care provider.

## 2017-09-07 NOTE — Progress Notes (Signed)
MEDICARE ANNUAL WELLNESS VISIT AND FOLLOW UP  Assessment:    Needs flu shot -     Flu vaccine HIGH DOSE PF  Essential hypertension - continue medications, DASH diet, exercise and monitor at home. Call if greater than 130/80.  -     CBC with Differential/Platelet -     BASIC METABOLIC PANEL WITH GFR -     Hepatic function panel -     TSH  Atherosclerosis of abdominal aorta (HCC) Control blood pressure, cholesterol, glucose, increase exercise.  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  T2_NIDDM -     Hemoglobin A1c Discussed general issues about diabetes pathophysiology and management., Educational material distributed., Suggested low cholesterol diet., Encouraged aerobic exercise., Discussed foot care., Reminded to get yearly retinal exam.  Hypothyroidism, unspecified type -     TSH Hypothyroidism-check TSH level, continue medications the same, reminded to take on an empty stomach 30-6mins before food.   Osteopenia, unspecified location Osteopenia- get dexa, continue Vit D and Ca, weight bearing exercises  Endometrial cancer ([place]) Continue follow up  Medication management -     Magnesium  Malignant neoplasm of upper-outer quadrant of left breast in female, estrogen receptor positive ([place]) Continue follow up  Vitamin D deficiency Continue supplement  Mixed hyperlipidemia -     Lipid panel -continue medications, check lipids, decrease fatty foods, increase activity.   Iron deficiency anemia, unspecified iron deficiency anemia type Monitor CBC  Insomnia, unspecified type -     LORazepam (ATIVAN) 0.5 MG tablet; Take 1/2-1 at night as needed for sleep - discussed this is short term, work on good sleep habits,  if need long term will go with other meds  Encounter for Medicare annual wellness exam 1 year - will get prevnar next OV - will request papers Dr. Delman Cheadle for eye exam  Over 30 minutes of exam, counseling, chart review, and critical decision making was performed  Future Appointments Date Time Provider [place]  09/15/2017 11:00 AM Nancy Marus, MD [facility] None  12/10/2017 10:30 AM [name], MD [facility] None  12/30/2017 8:30 AM [name], MD [facility] None  05/31/2018 9:30 AM [facility] LAB 3 [facility] None  05/31/2018 10:00 AM [name], MD [facility] None  06/22/2018 2:00 PM [name], MD [facility] None    Plan:   During the course of the visit the patient was educated and counseled about appropriate screening and preventive services including:    Pneumococcal vaccine   Influenza vaccine  Td vaccine  Prevnar 13  Screening electrocardiogram  Screening mammography  Bone densitometry screening  Colorectal cancer screening  Diabetes screening  Glaucoma screening  Nutrition counseling   Advanced directives: given info/requested copies   Subjective:   Tammy Hensley is a 76 y.o. female who presents for Medicare Annual Wellness Visit and 3 month follow up on hypertension, prediabetes, hyperlipidemia, vitamin D def.   Her blood pressure has been controlled at home, today their BP is BP: 116/72 She does not workout. She denies chest pain, shortness of breath, dizziness.  She hasn't been able to exercise as she has been taking her girlfriend to appointments She has history of endometrial cancer diagnosed Oct 2017 s/p TAH/BSO in Nov 2017 and completed carboplatin/taxol in Jan 2018 with Dr. Alvy Bimler and radiation per Dr.  Kinard. She has been having fatigue, not sleeping well since CA DX. She had negative  Husband, Nat, had stroke as well and this has caused increase stress.  She has been having right knee pain.   She has history left  breast cancer in 2006, 5 years tamoxifen.   She is on cholesterol medication and denies myalgias. Her cholesterol is at goal. The cholesterol last visit was:   Lab Results  Component Value Date   CHOL 135 05/27/2017   HDL 46 (L) 05/27/2017   LDLCALC 54 05/27/2017   TRIG 173 (H) 05/27/2017   CHOLHDL 2.9 05/27/2017    Patient is working on a healthy diet. She has had diabetes x 2009 with CKD.   She is not exercising.  No symptoms of diabetes reported.  :  Lab Results  Component Value Date   HGBA1C 6.5 (H) 05/27/2017   Lab Results  Component Value Date   GFRAA >60 10/14/2016   Patient is on Vitamin D supplement. Lab Results  Component Value Date   VD25OH 47 05/27/2017    She is on thyroid medication. Her medication was not changed last visit. On 1/2 pill M,W,F,S and 1 pill the rest.  Lab Results  Component Value Date   TSH 0.62 05/27/2017  .   BMI is Body mass index is 24.8 kg/m., she is working on diet and exercise. Wt Readings from Last 3 Encounters:  09/07/17 140 lb (63.5 kg)  08/16/17 145 lb (65.8 kg)  05/31/17 147 lb (66.7 kg)    Medication Review Current Outpatient Prescriptions on File Prior to Visit  Medication Sig Dispense Refill  . acetaminophen (TYLENOL) 500 MG tablet Take 500 mg by mouth daily as needed for moderate pain or headache.    Marland Kitchen aspirin 81 MG tablet Take 81 mg by mouth daily.      . Calcium Carbonate-Vitamin D (CALCIUM 600 + D PO) Take 1 tablet by mouth 2 (two) times daily.     . Cholecalciferol (VITAMIN D) 2000 units CAPS Take 2,000 Units by mouth every other day.    . levothyroxine (SYNTHROID, LEVOTHROID) 88 MCG tablet Take 1 tablet (88 mcg total) by mouth daily before breakfast. 90 tablet 1  . loratadine-pseudoephedrine (CLARITIN-D 24 HOUR) 10-240 MG 24 hr tablet Take 1 tablet by mouth daily as needed for allergies.    . metFORMIN (GLUCOPHAGE-XR) 500 MG 24 hr tablet TAKE ONE TABLET BY MOUTH WITH BREAKFAST AND LUNCH AND TAKE TWO TABLETS BY MOUTH  WITH SUPPER FOR DAIBETES. (Patient taking differently: takes one tablet with supper) 360 tablet 1  . Multiple Vitamins-Minerals (MULTIVITAMIN PO) Take 1 tablet by mouth daily.     . pravastatin (PRAVACHOL) 40 MG tablet Take 1 tablet (40 mg total) by mouth every evening. 30 tablet 1  . triamterene-hydrochlorothiazide (MAXZIDE) 75-50 MG tablet TAKE ONE TABLET BY MOUTH ONCE DAILY FOR BLOOD PRESSURE AND  FLUID (Patient taking differently: TAKE 1/2 TABLET BY MOUTH ONCE DAILY FOR BLOOD PRESSURE AND  FLUID) 90 tablet 3   No current facility-administered medications on file prior to visit.     Allergies: Allergies  Allergen Reactions  . Calan [Verapamil]     Constipation   . Effexor [Venlafaxine]     unknown  . Erythromycin Nausea And Vomiting  . Femara [Letrozole] Swelling  . Fosamax [Alendronate]     aching  . Ibuprofen     Dizziness, incoherent   . Paxil [Paroxetine Hcl] Nausea Only  . Remeron [Mirtazapine]  Weight gain   . Tamoxifen     Hair loss @ low dose  . 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  Screening Tests Immunization History  Administered Date(s) Administered  . Influenza,inj,Quad PF,6+ Mos 11/12/2016  . Pneumococcal-Unspecified 12/29/2011  . Td 12/29/2011    Preventative care: Last colonoscopy: 2017 Last mammogram: 10/2016 DEXA:2011 CT AB 08/2016 PAP 08/2016  Prior vaccinations: TD or Tdap:  2013  Influenza: TODAY Pneumococcal: 2013 Prevnar13: DECLINED today will get later Shingles/Zostavax: Declined  Names of Other Physician/Practitioners you currently use: 1. [facility]- here for primary care 2. Dr. Delman Cheadle , DIABETIC eye doctor, last visit 07/2016 SHE IS DUE 3. Dentures, Dr. Ennis Forts dentist, last visit 2018 Patient Care Team: [name], MD as PCP - General (Internal Medicine) [name], MD as Consulting Physician (Gastroenterology) [name], MD as Consulting Physician (Ophthalmology) Magrinat, Virgie Dad, MD as Consulting Physician (Oncology) Cheri Fowler, MD as Consulting Physician (Obstetrics and Gynecology)  Surgical: She  has a past surgical history that includes Breast surgery (2006); perineum (1973); Robotic assisted lap vaginal hysterectomy (N/A, 10/13/2016); Robotic assisted salpingo oophorectomy (Bilateral, 10/13/2016); and Sentinel node biopsy (N/A, 10/13/2016). Family  Patient's family history includes Breast cancer (age of onset: 28) in her paternal aunt; Cancer in her father and paternal grandmother; Heart attack in her father and mother; Heart disease in her mother; Prostate cancer (age of onset: 51) in her brother; Prostate cancer (age of onset: 3) in her brother; Prostate cancer (age of onset: 34) in her father; Prostate cancer (age of onset: 61) in her paternal uncle. Social history  She reports that she has never smoked. She has never used smokeless tobacco. She reports that she does not drink alcohol or use drugs.  MEDICARE WELLNESS OBJECTIVES: Physical activity:   Cardiac risk factors:   Depression/mood screen:   Depression screen [facility] 2/9 08/16/2017  Decreased Interest 0  Down, Depressed, Hopeless 0  PHQ - 2 Score 0    ADLs:  In your present state of health, do you have any difficulty performing the following activities: 05/27/2017 01/06/2017  Hearing? N N  Vision? N N  Difficulty concentrating  or making decisions? N N  Walking or climbing stairs? N N  Dressing or bathing? N N  Doing errands, shopping? N N  Some recent data might be hidden     Cognitive Testing  Alert? Yes  Normal Appearance?Yes  Oriented to person? Yes  Place? Yes   Time? Yes  Recall of three objects?  Yes  Can perform simple calculations? Yes  Displays appropriate judgment?Yes  Can read the correct time from a watch face?Yes  EOL planning: Does Patient Have a Medical Advance Directive?: Yes Type of Advance Directive: Healthcare Power of Attorney, Living will Copy of [place] in Chart?: No - copy requested   Objective:   Today's Vitals   09/07/17 1118  BP: 116/72  Pulse: 81  Resp: 14  Temp: (!) 97.3 F (36.3 C)  SpO2: 99%  Weight: 140 lb (63.5 kg)  Height: 5\' 3"  (1.6 m)  PainSc: 0-No pain   Body mass index is 24.8 kg/m.  Wt Readings from Last 3 Encounters:  09/07/17 140 lb (63.5 kg)  08/16/17 145 lb (65.8 kg)  05/31/17 147 lb (66.7 kg)   General appearance: alert, no distress, WD/WN,  female HEENT: normocephalic, sclerae anicteric, TMs pearly, nares patent, no discharge or erythema, pharynx normal Oral cavity: MMM, no lesions Neck: supple, no lymphadenopathy, no thyromegaly, no masses Heart: RRR, normal S1, S2, no murmurs Lungs: CTA bilaterally, no wheezes, rhonchi, or rales Abdomen: +bs, soft, non tender, non distended, no masses, no hepatomegaly, no splenomegaly Musculoskeletal: nontender, no swelling, no obvious deformity Extremities: no edema, no cyanosis, no clubbing Pulses: 2+ symmetric, upper and lower extremities, normal cap refill Neurological: alert, oriented x 3, CN2-12 intact, strength normal upper extremities and lower extremities, sensation normal throughout, DTRs 2+ throughout, no cerebellar signs, gait normal Psychiatric: normal affect, behavior normal, pleasant  Breast: defer Gyn: defer Rectal: defer   Medicare Attestation I have personally  reviewed: The patient's medical and social history Their use of alcohol, tobacco or illicit drugs Their current medications and supplements The patient's functional ability including ADLs,fall risks, home safety risks, cognitive, and hearing and visual impairment Diet and physical activities Evidence for depression or mood disorders  The patient's weight, height, BMI, and visual acuity have been recorded in the chart.  I have made referrals, counseling, and provided education to the patient based on review of the above and I have provided the patient with a written personalized care plan for preventive services.     Vicie Mutters, PA-C   09/07/2017

## 2017-09-07 NOTE — Progress Notes (Signed)
LORAZEPAM has been called into pharmacy on 16th Oct 2018 by DD

## 2017-09-09 NOTE — Addendum Note (Signed)
Addended by: Vicie Mutters R on: 09/09/2017 10:55 AM   Modules accepted: Orders

## 2017-09-10 LAB — CBC WITH DIFFERENTIAL/PLATELET
BASOS PCT: 0.4 %
Basophils Absolute: 10 cells/uL (ref 0–200)
EOS ABS: 29 {cells}/uL (ref 15–500)
EOS PCT: 1.1 %
HCT: 37.4 % (ref 35.0–45.0)
HEMOGLOBIN: 12.2 g/dL (ref 11.7–15.5)
Lymphs Abs: 580 cells/uL — ABNORMAL LOW (ref 850–3900)
MCH: 26.6 pg — AB (ref 27.0–33.0)
MCHC: 32.6 g/dL (ref 32.0–36.0)
MCV: 81.7 fL (ref 80.0–100.0)
MONOS PCT: 11.4 %
MPV: 9.3 fL (ref 7.5–12.5)
NEUTROS ABS: 1685 {cells}/uL (ref 1500–7800)
Neutrophils Relative %: 64.8 %
PLATELETS: 285 10*3/uL (ref 140–400)
RBC: 4.58 10*6/uL (ref 3.80–5.10)
RDW: 13.1 % (ref 11.0–15.0)
TOTAL LYMPHOCYTE: 22.3 %
WBC mixed population: 296 cells/uL (ref 200–950)
WBC: 2.6 10*3/uL — AB (ref 3.8–10.8)

## 2017-09-10 LAB — BASIC METABOLIC PANEL WITH GFR
BUN: 10 mg/dL (ref 7–25)
CHLORIDE: 103 mmol/L (ref 98–110)
CO2: 32 mmol/L (ref 20–32)
CREATININE: 0.69 mg/dL (ref 0.60–0.93)
Calcium: 9.9 mg/dL (ref 8.6–10.4)
GFR, Est African American: 98 mL/min/{1.73_m2} (ref >=60)
GFR, Est Non African American: 85 mL/min/{1.73_m2} (ref >=60)
GLUCOSE: 89 mg/dL (ref 65–99)
Potassium: 4.1 mmol/L (ref 3.5–5.3)
SODIUM: 142 mmol/L (ref 135–146)

## 2017-09-10 LAB — LIPID PANEL
CHOL/HDL RATIO: 1.8 (calc) (ref <5.0)
CHOLESTEROL: 103 mg/dL (ref <200)
HDL: 58 mg/dL (ref >50)
LDL CHOLESTEROL (CALC): 26 mg/dL
Non-HDL Cholesterol (Calc): 45 mg/dL (calc) (ref <130)
Triglycerides: 105 mg/dL (ref <150)

## 2017-09-10 LAB — HEPATIC FUNCTION PANEL
AG Ratio: 2 (calc) (ref 1.0–2.5)
ALKALINE PHOSPHATASE (APISO): 52 U/L (ref 33–130)
ALT: 8 U/L (ref 6–29)
AST: 14 U/L (ref 10–35)
Albumin: 4.3 g/dL (ref 3.6–5.1)
BILIRUBIN INDIRECT: 0.4 mg/dL (ref 0.2–1.2)
Bilirubin, Direct: 0.1 mg/dL (ref 0.0–0.2)
GLOBULIN: 2.2 g/dL (ref 1.9–3.7)
TOTAL PROTEIN: 6.5 g/dL (ref 6.1–8.1)
Total Bilirubin: 0.5 mg/dL (ref 0.2–1.2)

## 2017-09-10 LAB — HEMOGLOBIN A1C
Hgb A1c MFr Bld: 5.7 % of total Hgb — ABNORMAL HIGH (ref <5.7)
Mean Plasma Glucose: 117 (calc)
eAG (mmol/L): 6.5 (calc)

## 2017-09-10 LAB — TSH: TSH: 0.55 m[IU]/L (ref 0.40–4.50)

## 2017-09-10 LAB — MAGNESIUM: MAGNESIUM: 1.8 mg/dL (ref 1.5–2.5)

## 2017-09-15 ENCOUNTER — Encounter: Payer: Self-pay | Admitting: Gynecologic Oncology

## 2017-09-15 ENCOUNTER — Ambulatory Visit: Payer: Medicare Other | Attending: Gynecologic Oncology | Admitting: Gynecologic Oncology

## 2017-09-15 VITALS — BP 138/70 | HR 74 | Temp 98.1°F | Resp 20 | Wt 142.3 lb

## 2017-09-15 DIAGNOSIS — Z7984 Long term (current) use of oral hypoglycemic drugs: Secondary | ICD-10-CM | POA: Diagnosis not present

## 2017-09-15 DIAGNOSIS — Z90722 Acquired absence of ovaries, bilateral: Secondary | ICD-10-CM | POA: Insufficient documentation

## 2017-09-15 DIAGNOSIS — Z7982 Long term (current) use of aspirin: Secondary | ICD-10-CM | POA: Insufficient documentation

## 2017-09-15 DIAGNOSIS — Z923 Personal history of irradiation: Secondary | ICD-10-CM

## 2017-09-15 DIAGNOSIS — I10 Essential (primary) hypertension: Secondary | ICD-10-CM | POA: Insufficient documentation

## 2017-09-15 DIAGNOSIS — F329 Major depressive disorder, single episode, unspecified: Secondary | ICD-10-CM | POA: Insufficient documentation

## 2017-09-15 DIAGNOSIS — E119 Type 2 diabetes mellitus without complications: Secondary | ICD-10-CM | POA: Insufficient documentation

## 2017-09-15 DIAGNOSIS — Z90710 Acquired absence of both cervix and uterus: Secondary | ICD-10-CM | POA: Diagnosis not present

## 2017-09-15 DIAGNOSIS — N950 Postmenopausal bleeding: Secondary | ICD-10-CM | POA: Insufficient documentation

## 2017-09-15 DIAGNOSIS — E785 Hyperlipidemia, unspecified: Secondary | ICD-10-CM | POA: Diagnosis not present

## 2017-09-15 DIAGNOSIS — Z9079 Acquired absence of other genital organ(s): Secondary | ICD-10-CM | POA: Diagnosis not present

## 2017-09-15 DIAGNOSIS — Z853 Personal history of malignant neoplasm of breast: Secondary | ICD-10-CM | POA: Insufficient documentation

## 2017-09-15 DIAGNOSIS — Z79899 Other long term (current) drug therapy: Secondary | ICD-10-CM | POA: Diagnosis not present

## 2017-09-15 DIAGNOSIS — Z9221 Personal history of antineoplastic chemotherapy: Secondary | ICD-10-CM

## 2017-09-15 DIAGNOSIS — C541 Malignant neoplasm of endometrium: Secondary | ICD-10-CM | POA: Insufficient documentation

## 2017-09-15 DIAGNOSIS — E559 Vitamin D deficiency, unspecified: Secondary | ICD-10-CM | POA: Diagnosis not present

## 2017-09-15 DIAGNOSIS — Z8673 Personal history of transient ischemic attack (TIA), and cerebral infarction without residual deficits: Secondary | ICD-10-CM | POA: Diagnosis not present

## 2017-09-15 DIAGNOSIS — Z8542 Personal history of malignant neoplasm of other parts of uterus: Secondary | ICD-10-CM | POA: Diagnosis not present

## 2017-09-15 NOTE — Progress Notes (Signed)
GYNECOLOGIC ONCOLOGY RETURN PATIENT CONSULTATION  Referring Provider: Dr. [name]   HISTORY OF PRESENT ILLNESS: Tammy Hensley is a 76 y.o. woman who was seen in consultation at the request of Dr. [name] for evaluation of endometrial cancer.   The patient presented to her PCP with postmenopausal bleeding. A pap smear was obtained showing carcinoma. She was seen by Dr. [name] and underwent colposcopy with ECC and EMB. 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 ECC and cervical biopsy were benign.  She has a personal history of stage II breast cancer. She took tamoxifen for 5 years. She reports no bleeding or GYN complaint until ~10/20. She has continued occasional spotting. She denies changes in bowel or bladder habits. No weight loss or anorexia. She does report undergoing genetic testing with her breast cancer 11 years ago which was reportedly negative. She has two family members with prostate cancer (dad and brother) and no family history of colon, breast, uterine or ovarian cancer.   CT 10/24: Reproductive: The endometrial stripe is markedly thickened at 3.5 cmwith marked heterogeneity identified. Large calcified fibroid isnoted. Cystic lesion in the right ovary measures 3.5 by 2.9 by 3.9cm. Left adnexa is unremarkable.  ENCOUNTER DATE: 10/13/2016 Surgery: Total robotic hysterectomy bilateral salpingo-oophorectomy, bilateral pelvic sentinel lymph node removal, left PA sentinel lymph node removal. Mini-laparotomy for specimen removal  Pathology:  1)Uterus, cervix, bilateral tubes and ovaries 2) Right obturator SLN 3) Left Obturator SLN 4) Left PA SLN  Operative findings: 14 week fibroid uterus with one large dominant 6 cm myoma that was palpably calcified. 4 cm right ovarian cyst. + SLN identified in the bilateral obturator spaces, + SLN identified in the left PA space.  Diagnosis 1. Lymph node, sentinel, biopsy, right obturator - THERE IS NO  EVIDENCE OF CARCINOMA IN 8 OF 8 LYMPH NODES (0/8). - SEE COMMENT. 2. Lymph node, sentinel, biopsy, left obturator - THERE IS NO EVIDENCE OF CARCINOMA IN 1 OF 1 LYMPH NODE (0/1). - SEE COMMENT. 3. Lymph node, sentinel, biopsy, left para-aortic - THERE IS NO EVIDENCE OF CARCINOMA IN 4 OF 4 LYMPH NODES (0/4). - SEE COMMENT. 4. Uterus +/- tubes/ovaries, neoplastic, cervix - INVASIVE MIXED SEROUS/ENDOMETRIOID ADENOCARCINOMA, MULTIPLE FOCI, FIGO GRADE III, THE LARGEST FOCUS SPANS 8.2 CM. - ADENOCARCINOMA EXTENDS INTO THE OUTER HALF OF THE MYOMETRIUM AND INVOLVES THE STROMA OF THE LOWER UTERINE SEGMENT AND CERVIX. - LYMPHOVASCULAR INVASION IS IDENTIFIED. - THE SURGICAL RESECTION MARGINS ARE NEGATIVE FOR CARCINOMA. - SEE ONCOLOGY TABLE BELOW. ADDITIONAL FINDINGS: - ENDOMETRIUM: ENDOMETRIOID TYPE POLYP(S), WHICH CONTAIN FOCI OF SIMILAR APPEARING MIXED SEROUS/ENDOMETRIOID ADENOCARCINOMA. - MYOMETRIUM: LEIOMYOMATA. - SEROSA: UNREMARKABLE. - BILATERAL ADNEXA: BENIGN OVARIES AND FALLOPIAN TUBES.  Interval History:  She completed her radiation: 02/10/17-03/16/17; 03/29/17-04/12/17;  1) Pelvis/ 45 Gy in 25 fractions  2) Vaginal Cuff/ 18 Gy in 3 fractions  She has cycle #6 of chemotherapy 05/31/17.  She was seen by Dr. Alvy Bimler in July and then had follow-up with Dr. Sondra Come in September. She states she's been doing great until her husband had a stroke about 2 months ago.Pelvic and occipital knees really had a most complete recovery. He's finished occupational therapy and is now doing physical therapy. He was started on Elocon [place]. She was provided a sleep aid secondary to the stress from this. She is otherwise doing well. Concomitant with this her brother-in-law passed away and her sister is dealing with her loss. Additionally her husband's brother also passed away in the past 2 months was  been very stressful. She thinks the stress is what precipitated his stroke. There are no other new medical problems  in the family. She does have a mild cough which she states is related to sinus issues.  Review of Systems  Constitutional: Denies fever. Stress Skin: No rash Cardiovascular: No chest pain, shortness of breath, or edema  Pulmonary: + slight non productive cough from sinus drainage Gastro Intestinal: No nausea, vomiting, constipation, or diarrhea reported.  Genitourinary: Denies vaginal bleeding. + occasional urge incontinence Musculoskeletal: No joint pain.  Neurologic: No weakness Psychology: Stress from the above issues, using a sleeping aid  PAST MEDICAL HISTORY: Past Medical History:  Diagnosis Date  . Avascular necrosis of bones of both hips ([place]) 11/17/2016  . Depression   . Diabetes ([place])    borderline  . Family history of cancer   . History of brachytherapy 03/29/17-04/12/17   vaginal cuff 18 Gy in 3 fractions  . History of radiation therapy 02/10/17-03/16/17   pelvis 45 Gy in 25 fractions  . Hyperlipidemia   . Hypertension   . Iron deficiency anemia   . Vitamin D deficiency     PAST SURGICAL HISTORY: Past Surgical History:  Procedure Laterality Date  . BREAST SURGERY  2006   left breast lumpectomy- radiation, took Femara   . 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 ROBOTIC ASSISTED LAP VAGINAL HYSTERECTOMY N/A 10/13/2016   Procedure: XI ROBOTIC ASSISTED LAPAROSCOPIC VAGINAL HYSTERECTOMY;  Surgeon: Nancy Marus, MD;  Location: [facility];  Service: Gynecology;  Laterality: N/A;  . ROBOTIC ASSISTED SALPINGO OOPHERECTOMY Bilateral 10/13/2016   Procedure: XI ROBOTIC ASSISTED SALPINGO OOPHORECTOMY;  Surgeon: Nancy Marus, MD;  Location: [facility];  Service: Gynecology;  Laterality: Bilateral;  . SENTINEL NODE BIOPSY N/A 10/13/2016   Procedure: SENTINEL NODE BIOPSY;  Surgeon: Nancy Marus, MD;  Location: [facility];  Service: Gynecology;  Laterality: N/A;    OB/GYN HISTORY: OB History  Gravida Para Term  Preterm AB Living  4 2 2   2 2   SAB TAB Ectopic Multiple Live Births  2       2    # Outcome Date GA Lbr Len/2nd Weight Sex Delivery Anes PTL Lv  4 SAB           3 SAB           2 Term           1 Term             Obstetric Comments  SVDx2     Age at menopause: ~50 Hx of HRT: denies Hx of STDs: denies Last pap: 2017  History of abnormal pap smears: denies  MEDICATIONS:  Current Outpatient Prescriptions:  .  acetaminophen (TYLENOL) 500 MG tablet, Take 500 mg by mouth daily as needed for moderate pain or headache., Disp: , Rfl:  .  aspirin 81 MG tablet, Take 81 mg by mouth daily.  , Disp: , Rfl:  .  Calcium Carbonate-Vitamin D (CALCIUM 600 + D PO), Take 1 tablet by mouth 2 (two) times daily. , Disp: , Rfl:  .  Cholecalciferol (VITAMIN D) 2000 units CAPS, Take 2,000 Units by mouth every other day., Disp: , Rfl:  .  levothyroxine (SYNTHROID, LEVOTHROID) 88 MCG tablet, Take 1 tablet (88 mcg total) by mouth daily before breakfast., Disp: 90 tablet, Rfl: 1 .  loratadine-pseudoephedrine (CLARITIN-D 24 HOUR) 10-240 MG 24 hr tablet,  Take 1 tablet by mouth daily as needed for allergies., Disp: , Rfl:  .  LORazepam (ATIVAN) 0.5 MG tablet, Take 1/2-1 at night as needed for sleep, Disp: 60 tablet, Rfl: 0 .  metFORMIN (GLUCOPHAGE-XR) 500 MG 24 hr tablet, TAKE ONE TABLET BY MOUTH WITH BREAKFAST AND LUNCH AND TAKE TWO TABLETS BY MOUTH WITH SUPPER FOR DAIBETES. (Patient taking differently: takes one tablet with supper), Disp: 360 tablet, Rfl: 1 .  Multiple Vitamins-Minerals (MULTIVITAMIN PO), Take 1 tablet by mouth daily. , Disp: , Rfl:  .  pravastatin (PRAVACHOL) 40 MG tablet, Take 1 tablet (40 mg total) by mouth every evening., Disp: 30 tablet, Rfl: 1 .  triamterene-hydrochlorothiazide (MAXZIDE) 75-50 MG tablet, TAKE ONE TABLET BY MOUTH ONCE DAILY FOR BLOOD PRESSURE AND  FLUID (Patient taking differently: TAKE 1/2 TABLET BY MOUTH ONCE DAILY FOR BLOOD PRESSURE AND  FLUID), Disp: 90 tablet, Rfl:  3  ALLERGIES: Allergies  Allergen Reactions  . Calan [Verapamil]     Constipation   . Effexor [Venlafaxine]     unknown  . Erythromycin Nausea And Vomiting  . Femara [Letrozole] Swelling  . Fosamax [Alendronate]     aching  . Ibuprofen     Dizziness, incoherent   . Paxil [Paroxetine Hcl] Nausea Only  . Remeron [Mirtazapine]     Weight gain   . Tamoxifen     Hair loss @ low dose  . Vasotec [Enalapril] Cough    FAMILY HISTORY: Family History  Problem Relation Age of Onset  . Heart disease Mother   . Heart attack Mother   . Cancer Father   . Heart attack Father   . Prostate cancer Father 66       deceased 83  . Breast cancer Paternal Aunt 48       deceased 76s  . Prostate cancer Paternal Uncle 39       deceased 42  . Cancer Paternal Grandmother        unsure; deceased 54s  . Prostate cancer Brother 60       currently 29  . Prostate cancer Brother 6       currently 76    SOCIAL HISTORY: Social History   Social History  . Marital status: Married    Spouse name: N/A  . Number of children: 2  . Years of education: N/A   Occupational History  . Not on file.   Social History Main Topics  . Smoking status: Never Smoker  . Smokeless tobacco: Never Used  . Alcohol use No  . Drug use: No  . Sexual activity: Not on file   Other Topics Concern  . Not on file   Social History Narrative  . No narrative on file    REVIEW OF SYSTEMS: Complete 10-system review is negative except as per HPI.  PHYSICAL EXAM: BP 138/70 (Patient Position: Sitting)   Pulse 74   Temp 98.1 F (36.7 C) (Oral)   Resp 20   Wt 142 lb 4.8 oz (64.5 kg)   SpO2 99%   BMI 25.21 kg/m  General: Alert, oriented, no acute distress.  Neck: Supple, no lymphadenopathy, no thyromegaly.  Lungs: Clear to auscultation bilaterally.  Cardiac: Regular rate and rhythm.  Abdomen/GI: Normoactive bowel sounds.  Soft, nondistended, nontender to palpation.  Well healed surgical incisions, no  appreciable hernias.  Extremities: Grossly normal range of motion.  Warm, well perfused.  No edema bilaterally.  Groins: No lymphadenopathy.  GU: External genitalia with no lesions or discharge.Speculum examination  reveals no visible lesions. The vaginal cuff is well-healed. Bimanual examination, the vagina accommodated to fingers. There are no palpable masses. There is no nodularity.   ASSESSMENT AND PLAN: REBIE PEALE is a 76 y.o. 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 She has completed her pelvic and vaginal cuff brachii therapy and is status post 6 cycles of paclitaxel and carboplatin which she completed in July 2018. I appreciate our colleagues both in radiation oncology and medical oncology for their assistance in the care of this lovely patient.   She has follow-up in radiation oncology in September and then in medical oncology in July. We will plan on seeing her back and around April or May of next year. We do not have her schedule out that 4. The patient was given instructions to call us in January so that we can get her scheduled.  Signs and symptoms of recurrent endometrial cancer were reviewed and discussed with the patient.

## 2017-09-15 NOTE — Patient Instructions (Signed)
Call in January 2019 to set up a follow up appointment for April or May.

## 2017-10-05 LAB — HM DIABETES EYE EXAM

## 2017-10-20 ENCOUNTER — Other Ambulatory Visit: Payer: Self-pay | Admitting: Internal Medicine

## 2017-10-20 DIAGNOSIS — Z139 Encounter for screening, unspecified: Secondary | ICD-10-CM

## 2017-11-19 ENCOUNTER — Ambulatory Visit
Admission: RE | Admit: 2017-11-19 | Discharge: 2017-11-19 | Disposition: A | Discharge status: Home / Self Care | Payer: Medicare Other | Source: Ambulatory Visit | Attending: Internal Medicine | Admitting: Internal Medicine

## 2017-11-19 DIAGNOSIS — Z139 Encounter for screening, unspecified: Secondary | ICD-10-CM

## 2017-11-19 HISTORY — DX: Malignant neoplasm of unspecified site of unspecified female breast: C50.919

## 2017-11-23 DIAGNOSIS — C7951 Secondary malignant neoplasm of bone: Secondary | ICD-10-CM

## 2017-11-23 HISTORY — DX: Secondary malignant neoplasm of bone: C79.51

## 2017-12-09 NOTE — Patient Instructions (Signed)

## 2017-12-09 NOTE — Progress Notes (Signed)
This very nice 77 y.o. [name] presents for 6 month follow up with Hypertension, Hyperlipidemia, Pre-Diabetes and Vitamin D Deficiency.       Patient was operated for [place] in 206 followed by Tamoxifen x 5 yrs. Then TAH/BSO (2017) for Uterine Ca followed by Ext Radiation.      Patient is treated for HTN (1995) & BP has been controlled at home. Today's BP: 106/80. Patient has had no complaints of any cardiac type chest pain, palpitations, dyspnea / orthopnea / PND, dizziness, claudication, or dependent edema.     Hyperlipidemia is controlled with diet & meds. Patient denies myalgias or other med SE's. Last Lipids were at goal: Lab Results  Component Value Date   CHOL 103 09/09/2017   HDL 58 09/09/2017   LDLCALC 54 05/27/2017   TRIG 105 09/09/2017   CHOLHDL 1.8 09/09/2017      Also, the patient has history of T2_NIDDM (A1c 6.6%/2009) and has CKD2. She denies  symptoms of reactive hypoglycemia, diabetic polys, paresthesias or visual blurring.  CBG's usually less than 100.  Last A1c was  Lab Results  Component Value Date   HGBA1C 5.7 (H) 09/09/2017      Patient was dx'd Hypothyroid 2003 and begun on relan  Further, the patient also has history of Vitamin D Deficiency and supplements vitamin D without any suspected side-effects. Last vitamin D was at goal:  Lab Results  Component Value Date   VD25OH 78 05/27/2017   Current Outpatient Medications on File Prior to Visit  Medication Sig  . acetaminophen (TYLENOL) 500 MG tablet Take 500 mg by mouth daily as needed for moderate pain or headache.  Marland Kitchen aspirin 81 MG tablet Take 81 mg by mouth daily.    . Calcium Carbonate-Vitamin D (CALCIUM 600 + D PO) Take 1 tablet by mouth 2 (two) times daily.   . Cholecalciferol (VITAMIN D) 2000 units CAPS Take 2,000 Units by mouth every other day.  . levothyroxine (SYNTHROID, LEVOTHROID) 88 MCG tablet Take 1 tablet (88 mcg total) by mouth daily before breakfast.  . loratadine-pseudoephedrine  (CLARITIN-D 24 HOUR) 10-240 MG 24 hr tablet Take 1 tablet by mouth daily as needed for allergies.  Marland Kitchen LORazepam (ATIVAN) 0.5 MG tablet Take 1/2-1 at night as needed for sleep  . metFORMIN (GLUCOPHAGE-XR) 500 MG 24 hr tablet TAKE ONE TABLET BY MOUTH WITH BREAKFAST AND LUNCH AND TAKE TWO TABLETS BY MOUTH WITH SUPPER FOR DAIBETES. (Patient taking differently: takes one tablet with supper)  . Multiple Vitamins-Minerals (MULTIVITAMIN PO) Take 1 tablet by mouth daily.   . pravastatin (PRAVACHOL) 40 MG tablet Take 1 tablet (40 mg total) by mouth every evening.  . triamterene-hydrochlorothiazide (MAXZIDE) 75-50 MG tablet TAKE ONE TABLET BY MOUTH ONCE DAILY FOR BLOOD PRESSURE AND  FLUID (Patient taking differently: TAKE 1/2 TABLET BY MOUTH ONCE DAILY FOR BLOOD PRESSURE AND  FLUID)   No current facility-administered medications on file prior to visit.    Allergies  Allergen Reactions  . Calan [Verapamil]     Constipation   . Effexor [Venlafaxine]     unknown  . Erythromycin Nausea And Vomiting  . Femara [Letrozole] Swelling  . Fosamax [Alendronate]     aching  . Ibuprofen     Dizziness, incoherent   . Paxil [Paroxetine Hcl] Nausea Only  . Remeron [Mirtazapine]     Weight gain   . Tamoxifen     Hair loss @ low dose  . Vasotec [Enalapril] Cough  PMHx:   Past Medical History:  Diagnosis Date  . Avascular necrosis of bones of both hips ([place]) 11/17/2016  . Breast cancer ([place]) 2006   left breast  . Depression   . Diabetes ([place])    borderline  . Family history of cancer   . History of brachytherapy 03/29/17-04/12/17   vaginal cuff 18 Gy in 3 fractions  . History of radiation therapy 02/10/17-03/16/17   pelvis 45 Gy in 25 fractions  . Hyperlipidemia   . Hypertension   . Iron deficiency anemia   . Vitamin D deficiency    Immunization History  Administered Date(s) Administered  . Influenza, High Dose Seasonal PF 09/07/2017  . Influenza,inj,Quad PF,6+ Mos 11/12/2016  .  Pneumococcal-Unspecified 12/29/2011  . Td 12/29/2011   Past Surgical History:  Procedure Laterality Date  . BREAST LUMPECTOMY Left 2006  . BREAST SURGERY  2006   left breast lumpectomy- radiation, took Femara   . 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 ROBOTIC ASSISTED LAP VAGINAL HYSTERECTOMY N/A 10/13/2016   Procedure: XI ROBOTIC ASSISTED LAPAROSCOPIC VAGINAL HYSTERECTOMY;  Surgeon: Nancy Marus, MD;  Location: [facility];  Service: Gynecology;  Laterality: N/A;  . ROBOTIC ASSISTED SALPINGO OOPHERECTOMY Bilateral 10/13/2016   Procedure: XI ROBOTIC ASSISTED SALPINGO OOPHORECTOMY;  Surgeon: Nancy Marus, MD;  Location: [facility];  Service: Gynecology;  Laterality: Bilateral;  . SENTINEL NODE BIOPSY N/A 10/13/2016   Procedure: SENTINEL NODE BIOPSY;  Surgeon: Nancy Marus, MD;  Location: [facility];  Service: Gynecology;  Laterality: N/A;   FHx:    Reviewed / unchanged  SHx:    Reviewed / unchanged  Systems Review:  Constitutional: Denies fever, chills, wt changes, headaches, insomnia, fatigue, night sweats, change in appetite. Eyes: Denies redness, blurred vision, diplopia, discharge, itchy, watery eyes.  ENT: Denies discharge, congestion, post nasal drip, epistaxis, sore throat, earache, hearing loss, dental pain, tinnitus, vertigo, sinus pain, snoring.  CV: Denies chest pain, palpitations, irregular heartbeat, syncope, dyspnea, diaphoresis, orthopnea, PND, claudication or edema. Respiratory: denies cough, dyspnea, DOE, pleurisy, hoarseness, laryngitis, wheezing.  Gastrointestinal: Denies dysphagia, odynophagia, heartburn, reflux, water brash, abdominal pain or cramps, nausea, vomiting, bloating, diarrhea, constipation, hematemesis, melena, hematochezia  or hemorrhoids. Genitourinary: Denies dysuria, frequency, urgency, nocturia, hesitancy, discharge, hematuria or flank pain. Musculoskeletal: Denies arthralgias, myalgias,  stiffness, jt. swelling, pain, limping or strain/sprain.  Skin: Denies pruritus, rash, hives, warts, acne, eczema or change in skin lesion(s). Neuro: No weakness, tremor, incoordination, spasms, paresthesia or pain. Psychiatric: Denies confusion, memory loss or sensory loss. Endo: Denies change in weight, skin or hair change.  Heme/Lymph: No excessive bleeding, bruising or enlarged lymph nodes.  Physical Exam  BP 106/80   Pulse 80   Temp (!) 97.1 F (36.2 C)   Resp 16   Ht 5\' 3"  (1.6 m)   Wt 139 lb 3.2 oz (63.1 kg)   BMI 24.66 kg/m   Appears well nourished, well groomed  and in no distress.  Eyes: PERRLA, EOMs, conjunctiva no swelling or erythema. Sinuses: No frontal/maxillary tenderness ENT/Mouth: EAC's clear, TM's nl w/o erythema, bulging. Nares clear w/o erythema, swelling, exudates. Oropharynx clear without erythema or exudates. Oral hygiene is good. Tongue normal, non obstructing. Hearing intact.  Neck: Supple. Thyroid nl. Car 2+/2+ without bruits, nodes or JVD. Chest: Respirations nl with BS clear & equal w/o rales, rhonchi, wheezing or stridor.  Cor: Heart sounds normal w/ regular rate and rhythm without sig.  murmurs, gallops, clicks or rubs. Peripheral pulses normal and equal  without edema.  Abdomen: Soft & bowel sounds normal. Non-tender w/o guarding, rebound, hernias, masses or organomegaly.  Lymphatics: Unremarkable.  Musculoskeletal: Full ROM all peripheral extremities, joint stability, 5/5 strength and normal gait.  Skin: Warm, dry without exposed rashes, lesions or ecchymosis apparent.  Neuro: Cranial nerves intact, reflexes equal bilaterally. Sensory-motor testing grossly intact. Tendon reflexes grossly intact.  Pysch: Alert & oriented x 3.  Insight and judgement nl & appropriate. No ideations.  Assessment and Plan:  1. Essential hypertension  - Continue medication, monitor blood pressure at home.  - Continue DASH diet. Reminder to go to the ER if any CP,    SOB, nausea, dizziness, severe HA, changes vision/speech.  - CBC with Differential/Platelet - BASIC METABOLIC PANEL WITH GFR - Magnesium - TSH  2. Hyperlipidemia, mixed  - Continue diet/meds, exercise,& lifestyle modifications.  - Continue monitor periodic cholesterol/liver & renal functions   - Hepatic function panel - Lipid panel - TSH  3. Type 2 diabetes mellitus treated without insulin (HCC)  - Continue diet, exercise, lifestyle modifications.  - Monitor appropriate labs.  - Hemoglobin A1c - Insulin, random  4. Vitamin D deficiency  - Continue supplementation.  - VITAMIN D 25 Hydroxy   5. Hypothyroidism  - TSH  6. Medication management  - CBC with Differential/Platelet - BASIC METABOLIC PANEL WITH GFR - Hepatic function panel - Magnesium - Lipid panel - TSH - Hemoglobin A1c - Insulin, random - VITAMIN D 25 Hydroxy       Discussed  regular exercise, BP monitoring, weight control to achieve/maintain BMI less than 25 and discussed med and SE's. Recommended labs to assess and monitor clinical status with further disposition pending results of labs. Over 30 minutes of exam, counseling, chart review was performed.

## 2017-12-10 ENCOUNTER — Ambulatory Visit: Payer: Medicare Other | Admitting: Internal Medicine

## 2017-12-10 ENCOUNTER — Encounter: Payer: Self-pay | Admitting: Internal Medicine

## 2017-12-10 VITALS — BP 106/80 | HR 80 | Temp 97.1°F | Resp 16 | Ht 63.0 in | Wt 139.2 lb

## 2017-12-10 DIAGNOSIS — E782 Mixed hyperlipidemia: Secondary | ICD-10-CM | POA: Diagnosis not present

## 2017-12-10 DIAGNOSIS — E559 Vitamin D deficiency, unspecified: Secondary | ICD-10-CM | POA: Diagnosis not present

## 2017-12-10 DIAGNOSIS — E039 Hypothyroidism, unspecified: Secondary | ICD-10-CM

## 2017-12-10 DIAGNOSIS — Z79899 Other long term (current) drug therapy: Secondary | ICD-10-CM

## 2017-12-10 DIAGNOSIS — E119 Type 2 diabetes mellitus without complications: Secondary | ICD-10-CM

## 2017-12-10 DIAGNOSIS — I10 Essential (primary) hypertension: Secondary | ICD-10-CM

## 2017-12-13 LAB — BASIC METABOLIC PANEL WITH GFR
BUN: 9 mg/dL (ref 7–25)
CHLORIDE: 97 mmol/L — AB (ref 98–110)
CO2: 29 mmol/L (ref 20–32)
CREATININE: 0.72 mg/dL (ref 0.60–0.93)
Calcium: 9.9 mg/dL (ref 8.6–10.4)
GFR, EST AFRICAN AMERICAN: 94 mL/min/{1.73_m2} (ref >=60)
GFR, EST NON AFRICAN AMERICAN: 81 mL/min/{1.73_m2} (ref >=60)
GLUCOSE: 81 mg/dL (ref 65–99)
Potassium: 3.6 mmol/L (ref 3.5–5.3)
Sodium: 135 mmol/L (ref 135–146)

## 2017-12-13 LAB — HEPATIC FUNCTION PANEL
AG RATIO: 2 (calc) (ref 1.0–2.5)
ALT: 9 U/L (ref 6–29)
AST: 15 U/L (ref 10–35)
Albumin: 4.6 g/dL (ref 3.6–5.1)
Alkaline phosphatase (APISO): 68 U/L (ref 33–130)
BILIRUBIN DIRECT: 0.1 mg/dL (ref 0.0–0.2)
BILIRUBIN INDIRECT: 0.5 mg/dL (ref 0.2–1.2)
BILIRUBIN TOTAL: 0.6 mg/dL (ref 0.2–1.2)
GLOBULIN: 2.3 g/dL (ref 1.9–3.7)
Total Protein: 6.9 g/dL (ref 6.1–8.1)

## 2017-12-13 LAB — CBC WITH DIFFERENTIAL/PLATELET
BASOS ABS: 11 {cells}/uL (ref 0–200)
Basophils Relative: 0.3 %
EOS ABS: 0 {cells}/uL — AB (ref 15–500)
EOS PCT: 0 %
HEMATOCRIT: 36.9 % (ref 35.0–45.0)
HEMOGLOBIN: 12.3 g/dL (ref 11.7–15.5)
LYMPHS ABS: 756 {cells}/uL — AB (ref 850–3900)
MCH: 25.7 pg — ABNORMAL LOW (ref 27.0–33.0)
MCHC: 33.3 g/dL (ref 32.0–36.0)
MCV: 77.2 fL — ABNORMAL LOW (ref 80.0–100.0)
MPV: 9.4 fL (ref 7.5–12.5)
Monocytes Relative: 9.7 %
NEUTROS ABS: 2394 {cells}/uL (ref 1500–7800)
Neutrophils Relative %: 68.4 %
Platelets: 319 10*3/uL (ref 140–400)
RBC: 4.78 10*6/uL (ref 3.80–5.10)
RDW: 14.1 % (ref 11.0–15.0)
Total Lymphocyte: 21.6 %
WBC mixed population: 340 cells/uL (ref 200–950)
WBC: 3.5 10*3/uL — ABNORMAL LOW (ref 3.8–10.8)

## 2017-12-13 LAB — LIPID PANEL
CHOL/HDL RATIO: 2.5 (calc) (ref <5.0)
Cholesterol: 149 mg/dL (ref <200)
HDL: 60 mg/dL (ref >50)
LDL CHOLESTEROL (CALC): 69 mg/dL
NON-HDL CHOLESTEROL (CALC): 89 mg/dL (ref <130)
Triglycerides: 121 mg/dL (ref <150)

## 2017-12-13 LAB — HEMOGLOBIN A1C
HEMOGLOBIN A1C: 5.8 %{Hb} — AB (ref <5.7)
Mean Plasma Glucose: 120 (calc)
eAG (mmol/L): 6.6 (calc)

## 2017-12-13 LAB — VITAMIN D 25 HYDROXY (VIT D DEFICIENCY, FRACTURES): Vit D, 25-Hydroxy: 63 ng/mL (ref 30–100)

## 2017-12-13 LAB — TSH: TSH: 1.16 m[IU]/L (ref 0.40–4.50)

## 2017-12-13 LAB — INSULIN, RANDOM: Insulin: 9.6 u[IU]/mL (ref 2.0–19.6)

## 2017-12-13 LAB — MAGNESIUM: Magnesium: 1.7 mg/dL (ref 1.5–2.5)

## 2017-12-30 ENCOUNTER — Encounter: Payer: Self-pay | Admitting: Radiation Oncology

## 2017-12-30 ENCOUNTER — Ambulatory Visit
Admission: RE | Admit: 2017-12-30 | Discharge: 2017-12-30 | Disposition: A | Discharge status: Home / Self Care | Payer: Medicare Other | Source: Ambulatory Visit | Attending: Radiation Oncology | Admitting: Radiation Oncology

## 2017-12-30 ENCOUNTER — Other Ambulatory Visit: Payer: Self-pay

## 2017-12-30 VITALS — BP 123/90 | HR 86 | Temp 98.1°F | Ht 63.0 in | Wt 137.6 lb

## 2017-12-30 DIAGNOSIS — C569 Malignant neoplasm of unspecified ovary: Secondary | ICD-10-CM | POA: Insufficient documentation

## 2017-12-30 DIAGNOSIS — Z79899 Other long term (current) drug therapy: Secondary | ICD-10-CM | POA: Insufficient documentation

## 2017-12-30 DIAGNOSIS — Z7984 Long term (current) use of oral hypoglycemic drugs: Secondary | ICD-10-CM | POA: Diagnosis not present

## 2017-12-30 DIAGNOSIS — Z7982 Long term (current) use of aspirin: Secondary | ICD-10-CM | POA: Insufficient documentation

## 2017-12-30 DIAGNOSIS — Z881 Allergy status to other antibiotic agents status: Secondary | ICD-10-CM | POA: Insufficient documentation

## 2017-12-30 DIAGNOSIS — C541 Malignant neoplasm of endometrium: Secondary | ICD-10-CM

## 2017-12-30 DIAGNOSIS — Z8673 Personal history of transient ischemic attack (TIA), and cerebral infarction without residual deficits: Secondary | ICD-10-CM | POA: Insufficient documentation

## 2017-12-30 DIAGNOSIS — C55 Malignant neoplasm of uterus, part unspecified: Secondary | ICD-10-CM | POA: Insufficient documentation

## 2017-12-30 DIAGNOSIS — Z886 Allergy status to analgesic agent status: Secondary | ICD-10-CM | POA: Insufficient documentation

## 2017-12-30 NOTE — Progress Notes (Signed)
Radiation Oncology         [phone] ________________________________  Name: Tammy Hensley MRN: [id number]  Date: 12/30/2017  DOB: [birth year]  Follow-Up Visit Note  CC: [name], MD  Nancy Marus, MD    ICD-10-CM   1. 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 Pt states that she is doing well overall. Pt reports having mild urinary incontinence and diarrhea, which she reports began after radiation treatments.She states that she is mildly fatigued. Pt reports that her husband had a mild stroke in September of 2018 and she takes care of him.  ALLERGIES:  is allergic to calan [verapamil]; effexor [venlafaxine]; erythromycin; femara [letrozole]; fosamax [alendronate]; ibuprofen; paxil [paroxetine hcl]; remeron [mirtazapine]; tamoxifen; and vasotec [enalapril].  Meds: Current Outpatient Medications  Medication Sig Dispense Refill  . acetaminophen (TYLENOL) 500 MG tablet Take 500 mg by mouth daily as needed for moderate pain or headache.    Marland Kitchen aspirin 81 MG tablet Take 81 mg by mouth daily.      . Calcium Carbonate-Vitamin D (CALCIUM 600 + D PO) Take 1 tablet by mouth 2 (two) times daily.     . Cholecalciferol (VITAMIN D) 2000 units CAPS Take 2,000 Units by mouth every other day.    . levothyroxine (SYNTHROID, LEVOTHROID) 88 MCG tablet Take 1 tablet (88 mcg total) by mouth daily before breakfast. 90 tablet 1  . loratadine-pseudoephedrine (CLARITIN-D 24 HOUR) 10-240 MG 24 hr tablet Take 1 tablet by mouth daily as needed for allergies.    Marland Kitchen LORazepam (ATIVAN) 0.5 MG tablet Take 1/2-1 at night as needed  for sleep 60 tablet 0  . metFORMIN (GLUCOPHAGE-XR) 500 MG 24 hr tablet TAKE ONE TABLET BY MOUTH WITH BREAKFAST AND LUNCH AND TAKE TWO TABLETS BY MOUTH WITH SUPPER FOR DAIBETES. (Patient taking differently: takes one tablet with supper) 360 tablet 1  . Multiple Vitamins-Minerals (MULTIVITAMIN PO) Take 1 tablet by mouth daily.     . pravastatin (PRAVACHOL) 40 MG tablet Take 1 tablet (40 mg total) by mouth every evening. 30 tablet 1  . triamterene-hydrochlorothiazide (MAXZIDE) 75-50 MG tablet TAKE ONE TABLET BY MOUTH ONCE DAILY FOR BLOOD PRESSURE AND  FLUID (Patient taking differently: TAKE 1/2 TABLET BY MOUTH ONCE DAILY FOR BLOOD PRESSURE AND  FLUID) 90 tablet 3   No current facility-administered medications for this encounter.     Physical Findings: The patient is in no acute distress. Patient is alert and oriented.  height is 5\' 3"  (1.6 m) and weight is 137 lb 9.6 oz (62.4 kg). Her oral temperature is 98.1 F (36.7 C). Her blood pressure is 123/90 and her pulse is 86. Her oxygen saturation is 100%. .   Lungs are clear to auscultation bilaterally. Heart has regular rate and rhythm. No palpable cervical, supraclavicular, or axillary adenopathy. Abdomen soft, non-tender, normal bowel sounds. On pelvic examination the external genitalia were unremarkable. A speculum exam was performed. There are no mucosal lesions noted in the vaginal vault. On bimanual and rectovaginal examination there were no pelvic masses appreciated.    Lab Findings: Lab Results  Component Value Date   WBC 3.5 (  L) 12/10/2017   HGB 12.3 12/10/2017   HCT 36.9 12/10/2017   MCV 77.2 (L) 12/10/2017   PLT 319 12/10/2017    Radiographic Findings: No results found.  Impression:   No evidence of recurrence on clinical exam. Suggested physical therapy evaluation for her urinary incontinence but at this time given her responsibilities with her husband, she does not wish to pursue this at this time.  Plan:  Pt will see Dr.Gehrig  in May of this year and follow up with Radiation Oncology in August of this year.   -----------------------------------  Blair Promise, PhD, MD  This document serves as a record of services personally performed by [name], MD. It was created on his behalf by Valeta Harms, a trained medical scribe. The creation of this record is based on the scribe's personal observations and the provider's statements to them. This document has been checked and approved by the attending provider.

## 2017-12-30 NOTE — Progress Notes (Signed)
Tammy Hensley is here for follow up.  She denies having any pain.  She reports having having occasional incontinence of urine.  She also has occasional diarrhea depending on what she eats.  She denies having any vaginal bleeding/discharge.  She reports that her energy level is OK.  She is having to do more at home due to her husband's stroke.  She also reports having trouble sleeping and takes Ativan as needed.  She is using a vaginal dilator.  BP 123/90 (BP Location: Right Arm, Patient Position: Sitting)   Pulse 86   Temp 98.1 F (36.7 C) (Oral)   Ht 5\' 3"  (1.6 m)   Wt 137 lb 9.6 oz (62.4 kg)   SpO2 100%   BMI 24.37 kg/m    Wt Readings from Last 3 Encounters:  12/30/17 137 lb 9.6 oz (62.4 kg)  12/10/17 139 lb 3.2 oz (63.1 kg)  09/15/17 142 lb 4.8 oz (64.5 kg)

## 2018-02-04 ENCOUNTER — Other Ambulatory Visit: Payer: Self-pay | Admitting: *Deleted

## 2018-02-04 MED ORDER — PRAVASTATIN SODIUM 40 MG PO TABS: 40.0000 mg | ORAL_TABLET | Freq: Every evening | ORAL | 1 refills | Status: DC

## 2018-03-02 ENCOUNTER — Other Ambulatory Visit: Payer: Self-pay | Admitting: *Deleted

## 2018-03-02 MED ORDER — PRAVASTATIN SODIUM 40 MG PO TABS: 40.0000 mg | ORAL_TABLET | Freq: Every evening | ORAL | 1 refills | Status: DC

## 2018-03-16 ENCOUNTER — Other Ambulatory Visit: Payer: Self-pay | Admitting: *Deleted

## 2018-03-16 DIAGNOSIS — E119 Type 2 diabetes mellitus without complications: Secondary | ICD-10-CM

## 2018-03-16 MED ORDER — METFORMIN HCL ER 500 MG PO TB24: ORAL_TABLET | ORAL | 1 refills | Status: DC

## 2018-03-18 ENCOUNTER — Ambulatory Visit: Payer: Self-pay | Admitting: Adult Health

## 2018-03-21 NOTE — Progress Notes (Signed)
FOLLOW UP  Assessment and Plan:   Hypertension Well controlled with current medications  Monitor blood pressure at home; patient to call if consistently greater than 130/80 Continue DASH diet.   Reminder to go to the ER if any CP, SOB, nausea, dizziness, severe HA, changes vision/speech, left arm numbness and tingling and jaw pain.  Cholesterol Currently at goal; continue with current therapy Continue low cholesterol diet and exercise.  Check lipid panel.   Diabetes with diabetic chronic kidney disease Continue medication: metformin Continue diet and exercise.  Perform daily foot/skin check, notify office of any concerning changes.  Check A1C  BMI 25 Continue to recommend diet heavy in fruits and veggies and low in animal meats, cheeses, and dairy products, appropriate calorie intake Discuss exercise recommendations routinely Continue to monitor weight at each visit  Hypothyroidism continue medications the same pending lab results reminded to take on an empty stomach 30-25mins before food.  check TSH level  Vitamin D Def Near goal at last visit; continue supplementation to maintain goal of 70-100 Defer Vit D level  Insomnia - good sleep hygiene discussed, increase day time activity Has failed melatonin and benadryl Will refill ativan which has been effective  Continue diet and meds as discussed. Further disposition pending results of labs. Discussed med's effects and SE's.   Over 30 minutes of exam, counseling, chart review, and critical decision making was performed.   Future Appointments  Date Time Provider [place]  05/31/2018  9:30 AM [facility] LAB 3 [facility] None  05/31/2018 10:00 AM Tammy Lark, MD [facility] None  06/30/2018  9:00 AM Tammy Pray, MD [facility] None  07/05/2018  9:00 AM Tammy Pinto, MD [facility] None     ----------------------------------------------------------------------------------------------------------------------  HPI 77 y.o. female  presents for 3 month follow up on hypertension, cholesterol, diabetes, weight and vitamin D deficiency. Patient was operated for [place] in 2006 followed by Tamoxifen x 5 yrs. Then TAH/BSO (2017) for Uterine Ca followed by Ext Radiation. She reports she is doing well other than difficulty sleeping at night, and some mild intermittent diarrhea (with greasy foods) after radiation.   she has a diagnosis of insomina and is currently prescribed ativan 0.25-0.5 mg but has been out of prescription for several months, has tried melatonin and benadryl without benefit. She does report ongoing problems falling and staying asleep.   BMI is Body mass index is 25.33 kg/m., she has been working on diet and exercise. Wt Readings from Last 3 Encounters:  03/22/18 143 lb (64.9 kg)  12/30/17 137 lb 9.6 oz (62.4 kg)  12/10/17 139 lb 3.2 oz (63.1 kg)   Her blood pressure has been controlled at home, today their BP is BP: 126/78  She does workout. She denies chest pain, shortness of breath, dizziness.   She is on cholesterol medication (pravastatin 20 mg daily) and denies myalgias. Her cholesterol is at goal. The cholesterol last visit was:   Lab Results  Component Value Date   CHOL 149 12/10/2017   HDL 60 12/10/2017   LDLCALC 69 12/10/2017   TRIG 121 12/10/2017   CHOLHDL 2.5 12/10/2017    She has been working on diet and exercise for T2DM controlled by metformin, and denies foot ulcerations, increased appetite, nausea, paresthesia of the feet, polydipsia, polyuria, visual disturbances, vomiting and weight loss. She checks a sporadic fasting value which has run in the 80s. Last A1C in the office was:  Lab Results  Component Value Date   HGBA1C 5.8 (H) 12/10/2017  She is on thyroid medication. Her medication was not changed last visit.   Lab Results   Component Value Date   TSH 1.16 12/10/2017   Patient is on Vitamin D supplement and approaching goal at recent check:   Lab Results  Component Value Date   VD25OH 63 12/10/2017        Current Medications:  Current Outpatient Medications on File Prior to Visit  Medication Sig  . acetaminophen (TYLENOL) 500 MG tablet Take 500 mg by mouth daily as needed for moderate pain or headache.  Marland Kitchen aspirin 81 MG tablet Take 81 mg by mouth daily.    . Calcium Carbonate-Vitamin D (CALCIUM 600 + D PO) Take 1 tablet by mouth 2 (two) times daily.   . Cholecalciferol (VITAMIN D) 2000 units CAPS Take 2,000 Units by mouth every other day.  . levothyroxine (SYNTHROID, LEVOTHROID) 88 MCG tablet Take 1 tablet (88 mcg total) by mouth daily before breakfast.  . loratadine-pseudoephedrine (CLARITIN-D 24 HOUR) 10-240 MG 24 hr tablet Take 1 tablet by mouth daily as needed for allergies.  . metFORMIN (GLUCOPHAGE-XR) 500 MG 24 hr tablet TAKE ONE TABLET BY MOUTH WITH BREAKFAST AND LUNCH AND TAKE TWO TABLETS BY MOUTH WITH SUPPER FOR DAIBETES.  . Multiple Vitamins-Minerals (MULTIVITAMIN PO) Take 1 tablet by mouth daily.   . pravastatin (PRAVACHOL) 40 MG tablet Take 1 tablet (40 mg total) by mouth every evening. (Patient taking differently: Take 40 mg by mouth every evening. Take 1/2 tablet by mouth daily)   No current facility-administered medications on file prior to visit.      Allergies:  Allergies  Allergen Reactions  . Calan [Verapamil]     Constipation   . Effexor [Venlafaxine]     unknown  . Erythromycin Nausea And Vomiting  . Femara [Letrozole] Swelling  . Fosamax [Alendronate]     aching  . Ibuprofen     Dizziness, incoherent   . Paxil [Paroxetine Hcl] Nausea Only  . Remeron [Mirtazapine]     Weight gain   . Tamoxifen     Hair loss @ low dose  . Vasotec [Enalapril] Cough     Medical History:  Past Medical History:  Diagnosis Date  . Avascular necrosis of bones of both hips ([place])  11/17/2016  . Breast cancer ([place]) 2006   left breast  . Depression   . Diabetes ([place])    borderline  . Family history of cancer   . History of brachytherapy 03/29/17-04/12/17   vaginal cuff 18 Gy in 3 fractions  . History of radiation therapy 02/10/17-03/16/17   pelvis 45 Gy in 25 fractions  . Hyperlipidemia   . Hypertension   . Iron deficiency anemia   . Vitamin D deficiency    Family history- Reviewed and unchanged Social history- Reviewed and unchanged   Review of Systems:  Review of Systems  Constitutional: Negative for malaise/fatigue and weight loss.  HENT: Negative for hearing loss and tinnitus.   Eyes: Negative for blurred vision and double vision.  Respiratory: Negative for cough, shortness of breath and wheezing.   Cardiovascular: Negative for chest pain, palpitations, orthopnea, claudication and leg swelling.  Gastrointestinal: Positive for diarrhea (Intermittent, food dependent, ongoing after radiation). Negative for abdominal pain, blood in stool, constipation, heartburn, melena, nausea and vomiting.  Genitourinary: Negative.   Musculoskeletal: Negative for joint pain and myalgias.  Skin: Negative for rash.  Neurological: Negative for dizziness, tingling, sensory change, weakness and headaches.  Endo/Heme/Allergies: Negative for polydipsia.  Psychiatric/Behavioral: Negative for depression,  memory loss, substance abuse and suicidal ideas. The patient has insomnia. The patient is not nervous/anxious.   All other systems reviewed and are negative.   Physical Exam: BP 126/78   Pulse 82   Temp (!) 97.4 F (36.3 C)   Ht 5\' 3"  (1.6 m)   Wt 143 lb (64.9 kg)   SpO2 98%   BMI 25.33 kg/m  Wt Readings from Last 3 Encounters:  03/22/18 143 lb (64.9 kg)  12/30/17 137 lb 9.6 oz (62.4 kg)  12/10/17 139 lb 3.2 oz (63.1 kg)   General Appearance: Well nourished, in no apparent distress. Eyes: PERRLA, EOMs, conjunctiva no swelling or erythema Sinuses: No  Frontal/maxillary tenderness ENT/Mouth: Ext aud canals clear, TMs without erythema, bulging. No erythema, swelling, or exudate on post pharynx.  Tonsils not swollen or erythematous. Hearing normal.  Neck: Supple, thyroid normal.  Respiratory: Respiratory effort normal, BS equal bilaterally without rales, rhonchi, wheezing or stridor.  Cardio: RRR with no MRGs. Brisk peripheral pulses without edema.  Abdomen: Soft, + BS.  Non tender, no guarding, rebound, hernias, masses. Lymphatics: Non tender without lymphadenopathy.  Musculoskeletal: Full ROM, 5/5 strength, Normal gait Skin: Warm, dry without rashes, lesions, ecchymosis.  Neuro: Cranial nerves intact. No cerebellar symptoms.  Psych: Awake and oriented X 3, normal affect, Insight and Judgment appropriate.    Tammy Ribas, NP 10:17 AM [facility]

## 2018-03-22 ENCOUNTER — Encounter: Payer: Self-pay | Admitting: Adult Health

## 2018-03-22 ENCOUNTER — Ambulatory Visit: Payer: Medicare Other | Admitting: Adult Health

## 2018-03-22 VITALS — BP 126/78 | HR 82 | Temp 97.4°F | Ht 63.0 in | Wt 143.0 lb

## 2018-03-22 DIAGNOSIS — E782 Mixed hyperlipidemia: Secondary | ICD-10-CM

## 2018-03-22 DIAGNOSIS — E119 Type 2 diabetes mellitus without complications: Secondary | ICD-10-CM | POA: Diagnosis not present

## 2018-03-22 DIAGNOSIS — E559 Vitamin D deficiency, unspecified: Secondary | ICD-10-CM

## 2018-03-22 DIAGNOSIS — I10 Essential (primary) hypertension: Secondary | ICD-10-CM

## 2018-03-22 DIAGNOSIS — Z6825 Body mass index (BMI) 25.0-25.9, adult: Secondary | ICD-10-CM

## 2018-03-22 DIAGNOSIS — Z79899 Other long term (current) drug therapy: Secondary | ICD-10-CM | POA: Diagnosis not present

## 2018-03-22 DIAGNOSIS — E039 Hypothyroidism, unspecified: Secondary | ICD-10-CM

## 2018-03-22 DIAGNOSIS — G4700 Insomnia, unspecified: Secondary | ICD-10-CM | POA: Diagnosis not present

## 2018-03-22 DIAGNOSIS — D509 Iron deficiency anemia, unspecified: Secondary | ICD-10-CM

## 2018-03-22 LAB — COMPLETE METABOLIC PANEL WITH GFR
AG Ratio: 1.8 (calc) (ref 1.0–2.5)
ALBUMIN MSPROF: 4.2 g/dL (ref 3.6–5.1)
ALKALINE PHOSPHATASE (APISO): 80 U/L (ref 33–130)
ALT: 10 U/L (ref 6–29)
AST: 15 U/L (ref 10–35)
BUN: 10 mg/dL (ref 7–25)
CO2: 30 mmol/L (ref 20–32)
CREATININE: 0.65 mg/dL (ref 0.60–0.93)
Calcium: 10 mg/dL (ref 8.6–10.4)
Chloride: 101 mmol/L (ref 98–110)
GFR, Est African American: 100 mL/min/{1.73_m2} (ref >=60)
GFR, Est Non African American: 86 mL/min/{1.73_m2} (ref >=60)
Globulin: 2.4 g/dL (calc) (ref 1.9–3.7)
Glucose, Bld: 81 mg/dL (ref 65–99)
Potassium: 4.6 mmol/L (ref 3.5–5.3)
Sodium: 143 mmol/L (ref 135–146)
Total Bilirubin: 0.6 mg/dL (ref 0.2–1.2)
Total Protein: 6.6 g/dL (ref 6.1–8.1)

## 2018-03-22 LAB — CBC WITH DIFFERENTIAL/PLATELET
BASOS ABS: 19 {cells}/uL (ref 0–200)
Basophils Relative: 0.5 %
Eosinophils Absolute: 19 cells/uL (ref 15–500)
Eosinophils Relative: 0.5 %
HCT: 36.5 % (ref 35.0–45.0)
HEMOGLOBIN: 12.3 g/dL (ref 11.7–15.5)
Lymphs Abs: 779 cells/uL — ABNORMAL LOW (ref 850–3900)
MCH: 26.1 pg — ABNORMAL LOW (ref 27.0–33.0)
MCHC: 33.7 g/dL (ref 32.0–36.0)
MCV: 77.3 fL — ABNORMAL LOW (ref 80.0–100.0)
MPV: 9.5 fL (ref 7.5–12.5)
Monocytes Relative: 10.4 %
NEUTROS ABS: 2588 {cells}/uL (ref 1500–7800)
Neutrophils Relative %: 68.1 %
Platelets: 302 10*3/uL (ref 140–400)
RBC: 4.72 10*6/uL (ref 3.80–5.10)
RDW: 14 % (ref 11.0–15.0)
Total Lymphocyte: 20.5 %
WBC mixed population: 395 cells/uL (ref 200–950)
WBC: 3.8 10*3/uL (ref 3.8–10.8)

## 2018-03-22 LAB — LIPID PANEL
CHOL/HDL RATIO: 2.4 (calc) (ref <5.0)
CHOLESTEROL: 134 mg/dL (ref <200)
HDL: 55 mg/dL (ref >50)
LDL CHOLESTEROL (CALC): 60 mg/dL
Non-HDL Cholesterol (Calc): 79 mg/dL (calc) (ref <130)
TRIGLYCERIDES: 108 mg/dL (ref <150)

## 2018-03-22 LAB — TSH: TSH: 0.99 mIU/L (ref 0.40–4.50)

## 2018-03-22 MED ORDER — LORAZEPAM 0.5 MG PO TABS: ORAL_TABLET | ORAL | 0 refills | Status: DC

## 2018-03-22 MED ORDER — TRIAMTERENE-HCTZ 75-50 MG PO TABS: ORAL_TABLET | ORAL | 3 refills | Status: DC

## 2018-03-22 NOTE — Patient Instructions (Signed)
Avoid using ativan every night - try to limit to 5 nights a week or less to avoid tolerance/addiction, and use lowest necessary dose   Insomnia Insomnia is frequent trouble falling and/or staying asleep. Insomnia can be a long term problem or a short term problem. Both are common. Insomnia can be a short term problem when the wakefulness is related to a certain stress or worry. Long term insomnia is often related to ongoing stress during waking hours and/or poor sleeping habits. Overtime, sleep deprivation itself can make the problem worse. Every little thing feels more severe because you are overtired and your ability to cope is decreased. CAUSES   Stress, anxiety, and depression.  Poor sleeping habits.  Distractions such as TV in the bedroom.  Naps close to bedtime.  Engaging in emotionally charged conversations before bed.  Technical reading before sleep.  Alcohol and other sedatives. They may make the problem worse. They can hurt normal sleep patterns and normal dream activity.  Stimulants such as caffeine for several hours prior to bedtime.  Pain syndromes and shortness of breath can cause insomnia.  Exercise late at night.  Changing time zones may cause sleeping problems (jet lag). It is sometimes helpful to have someone observe your sleeping patterns. They should look for periods of not breathing during the night (sleep apnea). They should also look to see how long those periods last. If you live alone or observers are uncertain, you can also be observed at a sleep clinic where your sleep patterns will be professionally monitored. Sleep apnea requires a checkup and treatment. Give your caregivers your medical history. Give your caregivers observations your family has made about your sleep.  SYMPTOMS   Not feeling rested in the morning.  Anxiety and restlessness at bedtime.  Difficulty falling and staying asleep. TREATMENT   Your caregiver may prescribe treatment for an  underlying medical disorders. Your caregiver can give advice or help if you are using alcohol or other drugs for self-medication. Treatment of underlying problems will usually eliminate insomnia problems.  Medications can be prescribed for short time use. They are generally not recommended for lengthy use.  Over-the-counter sleep medicines are not recommended for lengthy use. They can be habit forming.  You can promote easier sleeping by making lifestyle changes such as:  Using relaxation techniques that help with breathing and reduce muscle tension.  Exercising earlier in the day.  Changing your diet and the time of your last meal. No night time snacks.  Establish a regular time to go to bed.  Counseling can help with stressful problems and worry.  Soothing music and white noise may be helpful if there are background noises you cannot remove.  Stop tedious detailed work at least one hour before bedtime. HOME CARE INSTRUCTIONS   Keep a diary. Inform your caregiver about your progress. This includes any medication side effects. See your caregiver regularly. Take note of:  Times when you are asleep.  Times when you are awake during the night.  The quality of your sleep.  How you feel the next day. This information will help your caregiver care for you.  Get out of bed if you are still awake after 15 minutes. Read or do some quiet activity. Keep the lights down. Wait until you feel sleepy and go back to bed.  Keep regular sleeping and waking hours. Avoid naps.  Exercise regularly.  Avoid distractions at bedtime. Distractions include watching television or engaging in any intense or detailed activity like  attempting to balance the household checkbook.  Develop a bedtime ritual. Keep a familiar routine of bathing, brushing your teeth, climbing into bed at the same time each night, listening to soothing music. Routines increase the success of falling to sleep faster.  Use  relaxation techniques. This can be using breathing and muscle tension release routines. It can also include visualizing peaceful scenes. You can also help control troubling or intruding thoughts by keeping your mind occupied with boring or repetitive thoughts like the old concept of counting sheep. You can make it more creative like imagining planting one beautiful flower after another in your backyard garden.  During your day, work to eliminate stress. When this is not possible use some of the previous suggestions to help reduce the anxiety that accompanies stressful situations. MAKE SURE YOU:   Understand these instructions.  Will watch your condition.  Will get help right away if you are not doing well or get worse. Document Released: 11/06/2000 Document Revised: 02/01/2012 Document Reviewed: 12/07/2007 [facility] Patient Information 2015 [place], [place]. This information is not intended to replace advice given to you by your health care provider. Make sure you discuss any questions you have with your health care provider.

## 2018-03-23 LAB — HEMOGLOBIN A1C
HEMOGLOBIN A1C: 6 %{Hb} — AB (ref <5.7)
Mean Plasma Glucose: 126 (calc)
eAG (mmol/L): 7 (calc)

## 2018-05-30 ENCOUNTER — Other Ambulatory Visit: Payer: Self-pay | Admitting: Hematology and Oncology

## 2018-05-30 DIAGNOSIS — C541 Malignant neoplasm of endometrium: Secondary | ICD-10-CM

## 2018-05-30 DIAGNOSIS — Z170 Estrogen receptor positive status [ER+]: Principal | ICD-10-CM

## 2018-05-30 DIAGNOSIS — C50412 Malignant neoplasm of upper-outer quadrant of left female breast: Secondary | ICD-10-CM

## 2018-05-31 ENCOUNTER — Encounter: Payer: Self-pay | Admitting: Hematology and Oncology

## 2018-05-31 ENCOUNTER — Telehealth: Payer: Self-pay | Admitting: Hematology and Oncology

## 2018-05-31 ENCOUNTER — Inpatient Hospital Stay: Payer: Medicare Other | Attending: Hematology and Oncology

## 2018-05-31 ENCOUNTER — Inpatient Hospital Stay (HOSPITAL_BASED_OUTPATIENT_CLINIC_OR_DEPARTMENT_OTHER): Payer: Medicare Other | Admitting: Hematology and Oncology

## 2018-05-31 DIAGNOSIS — Z170 Estrogen receptor positive status [ER+]: Secondary | ICD-10-CM

## 2018-05-31 DIAGNOSIS — D72819 Decreased white blood cell count, unspecified: Secondary | ICD-10-CM | POA: Insufficient documentation

## 2018-05-31 DIAGNOSIS — C50412 Malignant neoplasm of upper-outer quadrant of left female breast: Secondary | ICD-10-CM

## 2018-05-31 DIAGNOSIS — Z853 Personal history of malignant neoplasm of breast: Secondary | ICD-10-CM

## 2018-05-31 DIAGNOSIS — C541 Malignant neoplasm of endometrium: Secondary | ICD-10-CM

## 2018-05-31 DIAGNOSIS — G4700 Insomnia, unspecified: Secondary | ICD-10-CM | POA: Insufficient documentation

## 2018-05-31 DIAGNOSIS — F329 Major depressive disorder, single episode, unspecified: Secondary | ICD-10-CM | POA: Insufficient documentation

## 2018-05-31 LAB — CBC WITH DIFFERENTIAL/PLATELET
BASOS ABS: 0 10*3/uL (ref 0.0–0.1)
BASOS PCT: 1 %
EOS ABS: 0 10*3/uL (ref 0.0–0.5)
Eosinophils Relative: 1 %
HEMATOCRIT: 40.1 % (ref 34.8–46.6)
HEMOGLOBIN: 13 g/dL (ref 11.6–15.9)
Lymphocytes Relative: 28 %
Lymphs Abs: 1 10*3/uL (ref 0.9–3.3)
MCH: 25.9 pg (ref 25.1–34.0)
MCHC: 32.5 g/dL (ref 31.5–36.0)
MCV: 79.6 fL (ref 79.5–101.0)
MONOS PCT: 9 %
Monocytes Absolute: 0.3 10*3/uL (ref 0.1–0.9)
NEUTROS ABS: 2.2 10*3/uL (ref 1.5–6.5)
NEUTROS PCT: 61 %
Platelets: 273 10*3/uL (ref 145–400)
RBC: 5.04 MIL/uL (ref 3.70–5.45)
RDW: 15.6 % — ABNORMAL HIGH (ref 11.2–14.5)
WBC: 3.5 10*3/uL — AB (ref 3.9–10.3)

## 2018-05-31 LAB — COMPREHENSIVE METABOLIC PANEL
ALT: 14 U/L (ref 0–44)
ANION GAP: 9 (ref 5–15)
AST: 16 U/L (ref 15–41)
Albumin: 4.3 g/dL (ref 3.5–5.0)
Alkaline Phosphatase: 76 U/L (ref 38–126)
BUN: 10 mg/dL (ref 8–23)
CHLORIDE: 100 mmol/L (ref 98–111)
CO2: 30 mmol/L (ref 22–32)
CREATININE: 0.79 mg/dL (ref 0.44–1.00)
Calcium: 10.3 mg/dL (ref 8.9–10.3)
Glucose, Bld: 89 mg/dL (ref 70–99)
POTASSIUM: 3.6 mmol/L (ref 3.5–5.1)
SODIUM: 139 mmol/L (ref 135–145)
Total Bilirubin: 0.4 mg/dL (ref 0.3–1.2)
Total Protein: 7.5 g/dL (ref 6.5–8.1)

## 2018-05-31 MED ORDER — TRAZODONE HCL 100 MG PO TABS: 100.0000 mg | ORAL_TABLET | Freq: Every day | ORAL | 11 refills | Status: DC

## 2018-05-31 NOTE — Progress Notes (Signed)
[facility] OFFICE PROGRESS NOTE  Patient Care Team: [name], MD as PCP - General (Internal Medicine) [name], MD as Consulting Physician (Gastroenterology) [name], MD as Consulting Physician (Ophthalmology) Magrinat, Virgie Dad, MD as Consulting Physician (Oncology) [name], [name], MD as Consulting Physician (Obstetrics and Gynecology) [name], MD as Consulting Physician (Hematology and Oncology) [name], MD as Consulting Physician (Radiation Oncology)  ASSESSMENT & PLAN:  Endometrial cancer [facility]) She has fully recovered from side effects of treatment Her GYN oncologist has left.  I will get another GYN oncologist to see her at the end of the year I will see her back once a year with history, physical examination and blood work We discussed the role of antiestrogen therapy in the setting of disease relapse   Breast cancer, left breast ([place]) She has remote history of breast cancer. I recommend she continues annual screening program. 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 likely due to her African-American heritage I recommend close observation only She is not symptomatic  Insomnia She has clinical signs of depression with poor appetite, weight loss, poor sleep as well as loss of sexual interest over the past year In addition to her completing treatment for uterine cancer, her husband suffered from a stroke soon after her completion of chemotherapy I recommend trial of trazodone that would help with sleep and mood disturbance I recommend starting her on low-dose 50 mg daily, to be increased to 100 mg end of next week if she felt it is beneficial I will call her next week for follow-up    No orders of the defined types were placed in this encounter.   INTERVAL HISTORY: Please see below for problem oriented charting. She returns with her husband for further follow-up Her husband  suffered from stroke approximately a week after she completed her chemotherapy last year He has almost completely recovered from his stroke except for some unilateral weakness and speech disturbance The patient has been sleeping poorly, with poor appetite and recent weight loss She felt somewhat clinically depressed and ruminates on the fact that she has lost sexual interest since her diagnosis She denies nausea, changes in bowel habits or abnormal vaginal bleeding recently She denies any recent abnormal breast examination, palpable mass, abnormal breast appearance or nipple changes  SUMMARY OF ONCOLOGIC HISTORY:   Breast cancer, left breast ([place])   08/25/2005 Pathology Results    LEFT BREAST, NEEDLE BIOPSY: IN SITU AND INVASIVE MAMMARY CARCINOMA. SEE COMMENT.  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 The greatest extent of invasive carcinoma as measured on the needle core biopsy measures 0.6 cm.         Endometrial cancer ([place])   02/03/2002 Pathology Results    1. BENIGN ENDOMETRIAL POLYPS.  2. UTERINE FIBROIDS: PORTIONS OF SMOOTH MUSCLE CONSISTENT WITH BENIGN LEIOMYOMA(S). PORTIONS OF BENIGN, CYSTICALLY ATROPHIC ENDOMETRIUM WITHOUT HYPERPLASIA OR EVIDENCE OF MALIGNANCY. 3. ENDOMETRIAL CURETTAGE: BENIGN ENDOMETRIUM AND SMOOTH MUSCLE.      09/07/2016 Pathology Results    PAP smear positive for malignant cells      09/07/2016 Initial Diagnosis    Patient had postmenopausal bleeding in 08-2016. She had PAP 09-07-16 by PCP Dr [name], which documented carcinoma. She had evaluation by Dr [name] with colposcopy/ ECC/endometrial biopsy documenting serous endometrioid endometrial carcinoma.      09/15/2016 Imaging  Ct imaging showed markedly thickened and heterogeneous endometrial stripe suspicious for endometrial carcinoma in this patient with  postmenopausal vaginal bleeding. Cystic lesion in the right ovary cannot be definitively characterized. Pelvic ultrasound is recommended for further evaluation. Aortoiliac atherosclerosis. Avascular necrosis of the femoral heads bilaterally.      10/07/2016 Tumor Marker    Patient's tumor was tested for the following markers: CA125 Results of the tumor marker test revealed 15.1      10/13/2016 Pathology Results    1. Lymph node, sentinel, biopsy, right obturator - THERE IS NO EVIDENCE OF CARCINOMA IN 8 OF 8 LYMPH NODES (0/8). - SEE COMMENT. 2. Lymph node, sentinel, biopsy, left obturator - THERE IS NO EVIDENCE OF CARCINOMA IN 1 OF 1 LYMPH NODE (0/1). - SEE COMMENT. 3. Lymph node, sentinel, biopsy, left para-aortic - THERE IS NO EVIDENCE OF CARCINOMA IN 4 OF 4 LYMPH NODES (0/4). - SEE COMMENT. 4. Uterus +/- tubes/ovaries, neoplastic, cervix - INVASIVE MIXED SEROUS/ENDOMETRIOID ADENOCARCINOMA, MULTIPLE FOCI, FIGO GRADE III, THE LARGEST FOCUS SPANS 8.2 CM. - ADENOCARCINOMA EXTENDS INTO THE OUTER HALF OF THE MYOMETRIUM AND INVOLVES THE STROMA OF THE LOWER UTERINE SEGMENT AND CERVIX. - LYMPHOVASCULAR INVASION IS IDENTIFIED. - THE SURGICAL RESECTION MARGINS ARE NEGATIVE FOR CARCINOMA. - SEE ONCOLOGY TABLE BELOW. ADDITIONAL FINDINGS: - ENDOMETRIUM: ENDOMETRIOID TYPE POLYP(S), WHICH CONTAIN FOCI OF SIMILAR APPEARING MIXED SEROUS/ENDOMETRIOID ADENOCARCINOMA. - MYOMETRIUM: LEIOMYOMATA. - SEROSA: UNREMARKABLE. - BILATERAL ADNEXA: BENIGN OVARIES AND FALLOPIAN TUBES.      10/13/2016 Surgery    Surgery: Total robotic hysterectomy bilateral salpingo-oophorectomy, bilateral pelvic sentinel lymph node removal, left PA sentinel lymph node removal. Mini-laparotomy for specimen removal Surgeons:  [name], MD; [name], MD  Pathology:  1)Uterus, cervix, bilateral tubes and ovaries 2) Right obturator SLN 3) Left Obturator SLN 4) Left PA SLN Operative findings: 14 week fibroid  uterus with one large dominant 6 cm myoma that was palpably calcified. 4 cm right ovarian cyst. + SLN identified in the bilateral obturator spaces, + SLN identified in the left PA space.       12/01/2016 - 05/31/2017 Chemotherapy    She received carboplatin & Taxol. She had 3 cycles of chemo followed by radiation and then 3 more cycles of chemo      01/11/2017 Genetic Testing    Testing was normal and did not reveal a mutation in these genes: The genes tested were the 80 genes on [facility]'s Multi-Cancer panel (ALK, APC, ATM, AXIN2, BAP1, BARD1, BLM, BMPR1A, BRCA1, BRCA2, BRIP1, CASR, CDC73, CDH1, CDK4, CDKN1B, CDKN1C, CDKN2A, CEBPA, CHEK2, DICER1, DIS3L2, EGFR, EPCAM, FH, FLCN, GATA2, GPC3, GREM1, HOXB13,  HRAS, KIT, MAX, MEN1, MET, MITF, MLH1, MSH2, MSH6, MUTYH, NBN, NF1, NF2, PALB2, PDGFRA, PHOX2B, PMS2, POLD1, POLE, POT1, PRKAR1A, PTCH1, PTEN, RAD50, RAD51C, RAD51D, RB1, RECQL4, RET, RUNX1, SDHA, SDHAF2, SDHB, SDHC, SDHD, SMAD4, SMARCA4, SMARCB1, SMARCE1, STK11, SUFU, TERC, TERT, TMEM127, TP53, TSC1, TSC2, VHL, WRN, and WT1).      02/10/2017 - 04/12/2017 Radiation Therapy    02/10/17-03/16/17; 03/29/17-04/12/17;  1) Pelvis/ 45 Gy in 25 fractions  2) Vaginal Cuff/ 18 Gy in 3 fractions       REVIEW OF SYSTEMS:   Constitutional: Denies fevers, chills  Eyes: Denies blurriness of vision Ears, nose, mouth, throat, and face: Denies mucositis or sore throat Respiratory: Denies cough, dyspnea or wheezes Cardiovascular: Denies palpitation, chest discomfort or lower extremity swelling Gastrointestinal:  Denies nausea, heartburn or change in bowel habits Skin: Denies abnormal skin rashes Lymphatics: Denies new lymphadenopathy or  easy bruising Neurological:Denies numbness, tingling or new weaknesses Behavioral/Psych: Mood is stable, no new changes  All other systems were reviewed with the patient and are negative.  I have reviewed the past medical history, past surgical history, social history and  family history with the patient and they are unchanged from previous note.  ALLERGIES:  is allergic to calan [verapamil]; effexor [venlafaxine]; erythromycin; femara [letrozole]; fosamax [alendronate]; ibuprofen; paxil [paroxetine hcl]; remeron [mirtazapine]; tamoxifen; and vasotec [enalapril].  MEDICATIONS:  Current Outpatient Medications  Medication Sig Dispense Refill  . acetaminophen (TYLENOL) 500 MG tablet Take 500 mg by mouth daily as needed for moderate pain or headache.    Marland Kitchen aspirin 81 MG tablet Take 81 mg by mouth daily.      . Calcium Carbonate-Vitamin D (CALCIUM 600 + D PO) Take 1 tablet by mouth 2 (two) times daily.     . Cholecalciferol (VITAMIN D) 2000 units CAPS Take 2,000 Units by mouth every other day.    . levothyroxine (SYNTHROID, LEVOTHROID) 88 MCG tablet Take 1 tablet (88 mcg total) by mouth daily before breakfast. 90 tablet 1  . loratadine-pseudoephedrine (CLARITIN-D 24 HOUR) 10-240 MG 24 hr tablet Take 1 tablet by mouth daily as needed for allergies.    . metFORMIN (GLUCOPHAGE-XR) 500 MG 24 hr tablet TAKE ONE TABLET BY MOUTH WITH BREAKFAST AND LUNCH AND TAKE TWO TABLETS BY MOUTH WITH SUPPER FOR DAIBETES. 360 tablet 1  . Multiple Vitamins-Minerals (MULTIVITAMIN PO) Take 1 tablet by mouth daily.     . pravastatin (PRAVACHOL) 40 MG tablet Take 1 tablet (40 mg total) by mouth every evening. (Patient taking differently: Take 40 mg by mouth every evening. Take 1/2 tablet by mouth daily) 90 tablet 1  . traZODone (DESYREL) 100 MG tablet Take 1 tablet (100 mg total) by mouth at bedtime. 30 tablet 11  . triamterene-hydrochlorothiazide (MAXZIDE) 75-50 MG tablet TAKE 1/2 TABLET BY MOUTH ONCE DAILY FOR BLOOD PRESSURE AND  FLUID 90 tablet 3   No current facility-administered medications for this visit.     PHYSICAL EXAMINATION: ECOG PERFORMANCE STATUS: 1 - Symptomatic but completely ambulatory  Vitals:   05/31/18 0927  BP: 137/77  Pulse: 83  Resp: 18  Temp: 97.6 F (36.4 C)   SpO2: 100%   Filed Weights   05/31/18 0927  Weight: 139 lb 4.8 oz (63.2 kg)    GENERAL:alert, no distress and comfortable SKIN: skin color, texture, turgor are normal, no rashes or significant lesions EYES: normal, Conjunctiva are pink and non-injected, sclera clear OROPHARYNX:no exudate, no erythema and lips, buccal mucosa, and tongue normal  NECK: supple, thyroid normal size, non-tender, without nodularity LYMPH:  no palpable lymphadenopathy in the cervical, axillary or inguinal LUNGS: clear to auscultation and percussion with normal breathing effort HEART: regular rate & rhythm and no murmurs and no lower extremity edema ABDOMEN:abdomen soft, non-tender and normal bowel sounds Musculoskeletal:no cyanosis of digits and no clubbing  NEURO: alert & oriented x 3 with fluent speech, no focal motor/sensory deficits  LABORATORY DATA:  I have reviewed the data as listed    Component Value Date/Time   NA 139 05/31/2018 0906   NA 137 05/31/2017 0931   K 3.6 05/31/2018 0906   K 3.5 05/31/2017 0931   CL 100 05/31/2018 0906   CL 99 12/20/2012 1336   CO2 30 05/31/2018 0906   CO2 25 05/31/2017 0931   GLUCOSE 89 05/31/2018 0906   GLUCOSE 161 (H) 05/31/2017 0931   GLUCOSE 110 (H) 12/20/2012 1336  BUN 10 05/31/2018 0906   BUN 10.9 05/31/2017 0931   CREATININE 0.79 05/31/2018 0906   CREATININE 0.65 03/22/2018 1020   CREATININE 0.8 05/31/2017 0931   CALCIUM 10.3 05/31/2018 0906   CALCIUM 10.6 (H) 05/31/2017 0931   PROT 7.5 05/31/2018 0906   PROT 7.5 05/31/2017 0931   ALBUMIN 4.3 05/31/2018 0906   ALBUMIN 4.2 05/31/2017 0931   AST 16 05/31/2018 0906   AST 16 05/31/2017 0931   ALT 14 05/31/2018 0906   ALT 15 05/31/2017 0931   ALKPHOS 76 05/31/2018 0906   ALKPHOS 91 05/31/2017 0931   BILITOT 0.4 05/31/2018 0906   BILITOT 0.33 05/31/2017 0931   GFRNONAA >60 05/31/2018 0906   GFRNONAA 86 03/22/2018 1020   GFRAA >60 05/31/2018 0906   GFRAA 100 03/22/2018 1020    No results  found for: SPEP, UPEP  Lab Results  Component Value Date   WBC 3.5 (L) 05/31/2018   NEUTROABS 2.2 05/31/2018   HGB 13.0 05/31/2018   HCT 40.1 05/31/2018   MCV 79.6 05/31/2018   PLT 273 05/31/2018      Chemistry      Component Value Date/Time   NA 139 05/31/2018 0906   NA 137 05/31/2017 0931   K 3.6 05/31/2018 0906   K 3.5 05/31/2017 0931   CL 100 05/31/2018 0906   CL 99 12/20/2012 1336   CO2 30 05/31/2018 0906   CO2 25 05/31/2017 0931   BUN 10 05/31/2018 0906   BUN 10.9 05/31/2017 0931   CREATININE 0.79 05/31/2018 0906   CREATININE 0.65 03/22/2018 1020   CREATININE 0.8 05/31/2017 0931      Component Value Date/Time   CALCIUM 10.3 05/31/2018 0906   CALCIUM 10.6 (H) 05/31/2017 0931   ALKPHOS 76 05/31/2018 0906   ALKPHOS 91 05/31/2017 0931   AST 16 05/31/2018 0906   AST 16 05/31/2017 0931   ALT 14 05/31/2018 0906   ALT 15 05/31/2017 0931   BILITOT 0.4 05/31/2018 0906   BILITOT 0.33 05/31/2017 0931     All questions were answered. The patient knows to call the clinic with any problems, questions or concerns. No barriers to learning was detected.  I spent 15 minutes counseling the patient face to face. The total time spent in the appointment was 20 minutes and more than 50% was on counseling and review of test results  [name], MD 05/31/2018 2:50 PM

## 2018-05-31 NOTE — Assessment & Plan Note (Signed)
She has clinical signs of depression with poor appetite, weight loss, poor sleep as well as loss of sexual interest over the past year In addition to her completing treatment for uterine cancer, her husband suffered from a stroke soon after her completion of chemotherapy I recommend trial of trazodone that would help with sleep and mood disturbance I recommend starting her on low-dose 50 mg daily, to be increased to 100 mg end of next week if she felt it is beneficial I will call her next week for follow-up

## 2018-05-31 NOTE — Assessment & Plan Note (Signed)
She has remote history of breast cancer. I recommend she continues annual screening program. Genetic testing was negative

## 2018-05-31 NOTE — Assessment & Plan Note (Signed)
She has fully recovered from side effects of treatment Her GYN oncologist has left.  I will get another GYN oncologist to see her at the end of the year I will see her back once a year with history, physical examination and blood work We discussed the role of antiestrogen therapy in the setting of disease relapse

## 2018-05-31 NOTE — Telephone Encounter (Signed)
Gave avs and calendar ° °

## 2018-05-31 NOTE — Assessment & Plan Note (Signed)
She has chronic intermittent leukopenia likely due to her African-American heritage I recommend close observation only She is not symptomatic

## 2018-06-10 ENCOUNTER — Telehealth: Payer: Self-pay | Admitting: Hematology and Oncology

## 2018-06-10 NOTE — Telephone Encounter (Signed)
I called the patient for follow-up She is sleeping better with trazodone 50 mg daily She will continue the same.  She is very pleased that she sleeps better and have no further questions or concerns.

## 2018-06-22 ENCOUNTER — Encounter: Payer: Self-pay | Admitting: Internal Medicine

## 2018-06-30 ENCOUNTER — Telehealth: Payer: Self-pay | Admitting: *Deleted

## 2018-06-30 ENCOUNTER — Other Ambulatory Visit: Payer: Self-pay

## 2018-06-30 ENCOUNTER — Ambulatory Visit
Admission: RE | Admit: 2018-06-30 | Discharge: 2018-06-30 | Disposition: A | Discharge status: Home / Self Care | Payer: Medicare Other | Source: Ambulatory Visit | Attending: Radiation Oncology | Admitting: Radiation Oncology

## 2018-06-30 VITALS — BP 145/66 | HR 80 | Temp 98.5°F | Resp 18 | Ht 63.0 in | Wt 140.2 lb

## 2018-06-30 DIAGNOSIS — Z7982 Long term (current) use of aspirin: Secondary | ICD-10-CM | POA: Insufficient documentation

## 2018-06-30 DIAGNOSIS — C541 Malignant neoplasm of endometrium: Secondary | ICD-10-CM | POA: Diagnosis not present

## 2018-06-30 DIAGNOSIS — Z7984 Long term (current) use of oral hypoglycemic drugs: Secondary | ICD-10-CM | POA: Insufficient documentation

## 2018-06-30 DIAGNOSIS — Z881 Allergy status to other antibiotic agents status: Secondary | ICD-10-CM | POA: Diagnosis not present

## 2018-06-30 DIAGNOSIS — Z886 Allergy status to analgesic agent status: Secondary | ICD-10-CM | POA: Insufficient documentation

## 2018-06-30 DIAGNOSIS — Z79899 Other long term (current) drug therapy: Secondary | ICD-10-CM | POA: Diagnosis not present

## 2018-06-30 NOTE — Telephone Encounter (Signed)
CALLED PATIENT TO INFORM OF APPT. WITH DR. Denman George ON 10-10-18 - ARRIVAL TIME - 12 PM, SPOKE WITH PATIENT AND SHE IS AWARE OF THIS APPT.

## 2018-06-30 NOTE — Progress Notes (Signed)
Radiation Oncology         [phone] ________________________________  Name: Tammy Hensley MRN: [id number]  Date: 06/30/2018  DOB: [birth year]  Follow-Up Visit Note  CC: [name], MD  Nancy Marus, MD    ICD-10-CM   1. 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  She reports not sleeping well, she takes trazodone as needed. She notes taking care of her husband since Sept 2018, which causes her stress. She reports inconsistent use of the vaginal dilator. She notes diarrhea with acidic foods.   ALLERGIES:  is allergic to calan [verapamil]; effexor [venlafaxine]; erythromycin; femara [letrozole]; fosamax [alendronate]; ibuprofen; paxil [paroxetine hcl]; remeron [mirtazapine]; tamoxifen; and vasotec [enalapril].  Meds: Current Outpatient Medications  Medication Sig Dispense Refill  . acetaminophen (TYLENOL) 500 MG tablet Take 500 mg by mouth daily as needed for moderate pain or headache.    Marland Kitchen aspirin 81 MG tablet Take 81 mg by mouth daily.      . Calcium Carbonate-Vitamin D (CALCIUM 600 + D PO) Take 1 tablet by mouth 2 (two) times daily.     . Cholecalciferol (VITAMIN D) 2000 units CAPS Take 2,000 Units by mouth every other day.    . levothyroxine (SYNTHROID, LEVOTHROID) 88 MCG tablet Take 1 tablet (88 mcg total) by mouth daily before breakfast. 90 tablet 1  . loratadine-pseudoephedrine (CLARITIN-D 24 HOUR) 10-240 MG 24 hr tablet Take 1 tablet by mouth daily as needed for allergies.    . metFORMIN (GLUCOPHAGE-XR) 500 MG 24 hr tablet TAKE ONE TABLET BY MOUTH WITH BREAKFAST AND LUNCH AND TAKE TWO TABLETS BY MOUTH WITH SUPPER FOR DAIBETES.  360 tablet 1  . Multiple Vitamins-Minerals (MULTIVITAMIN PO) Take 1 tablet by mouth daily.     . pravastatin (PRAVACHOL) 40 MG tablet Take 1 tablet (40 mg total) by mouth every evening. (Patient taking differently: Take 40 mg by mouth every evening. Take 1/2 tablet by mouth daily) 90 tablet 1  . traZODone (DESYREL) 100 MG tablet Take 1 tablet (100 mg total) by mouth at bedtime. 30 tablet 11  . triamterene-hydrochlorothiazide (MAXZIDE) 75-50 MG tablet TAKE 1/2 TABLET BY MOUTH ONCE DAILY FOR BLOOD PRESSURE AND  FLUID 90 tablet 3   No current facility-administered medications for this encounter.     Physical Findings: The patient is in no acute distress. Patient is alert and oriented.  height is 5\' 3"  (1.6 m) and weight is 140 lb 3.2 oz (63.6 kg). Her oral temperature is 98.5 F (36.9 C). Her blood pressure is 145/66 (abnormal) and her pulse is 80. Her respiration is 18 and oxygen saturation is 100%.    Lungs are clear to auscultation bilaterally. Heart has regular rate and rhythm. No palpable cervical, supraclavicular, or axillary adenopathy. Abdomen soft, non-tender, normal bowel sounds. On pelvic examination the external genitalia were unremarkable. A speculum exam was performed. There are no mucosal lesions noted in the vaginal vault. On bimanual and rectovaginal examination there were no pelvic masses appreciated.   Lab Findings: Lab Results  Component Value Date   WBC 3.5 (L) 05/31/2018   HGB 13.0 05/31/2018   HCT 40.1 05/31/2018   MCV 79.6 05/31/2018  PLT 273 05/31/2018    Radiographic Findings: No results found.  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 Patient continues to have significant fatigue, which is likely related to her taking care of her husband. Recent blood work shows no sign of anemia. Patient will meet with her PCP in the next few days to  discuss her fatigue issues. Patient continues to have problems with diarrhea, particularly related to certain foods. Foods that are acidic seem to be most difficult for her, such as orange juice and apple juice. I recommend that she start a probiotic and was given information concerning that.  Plan:  Patient will be set up to see Dr. Denman George for f/u approximately 3 months, as Dr. Alycia Rossetti is no longer seeing patients in [place]. She will follow up with radiation oncology in approximately 6 months.  -----------------------------------  Blair Promise, PhD, MD  This document serves as a record of services personally performed by [name], MD. It was created on his behalf by Wilburn Mylar, a trained medical scribe. The creation of this record is based on the scribe's personal observations and the provider's statements to them. This document has been checked and approved by the attending provider.

## 2018-06-30 NOTE — Progress Notes (Signed)
Tammy Hensley presents today for f/u with Dr. Sondra Come. Pt reports fatigue is severe. Pt denies pain. Pt denies dysuria. Pt denies hematuria. Pt denies vaginal bleeding/discharge. Pt denies rectal bleeding or constipation. Pt does report diarrhea with certain foods. Pt takes Immodium with good results. Pt is unaccompanied.  BP (!) 145/66 (BP Location: Right Arm, Patient Position: Sitting)   Pulse 80   Temp 98.5 F (36.9 C) (Oral)   Resp 18   Ht 5\' 3"  (1.6 m)   Wt 140 lb 3.2 oz (63.6 kg)   SpO2 100%   BMI 24.84 kg/m   Wt Readings from Last 3 Encounters:  06/30/18 140 lb 3.2 oz (63.6 kg)  05/31/18 139 lb 4.8 oz (63.2 kg)  03/22/18 143 lb (64.9 kg)   Loma Sousa, RN BSN

## 2018-07-05 ENCOUNTER — Encounter: Payer: Self-pay | Admitting: Internal Medicine

## 2018-07-05 ENCOUNTER — Ambulatory Visit: Payer: Medicare Other | Admitting: Internal Medicine

## 2018-07-05 VITALS — BP 136/78 | HR 84 | Temp 97.0°F | Resp 16 | Ht 62.0 in | Wt 139.4 lb

## 2018-07-05 DIAGNOSIS — E782 Mixed hyperlipidemia: Secondary | ICD-10-CM

## 2018-07-05 DIAGNOSIS — E559 Vitamin D deficiency, unspecified: Secondary | ICD-10-CM

## 2018-07-05 DIAGNOSIS — Z136 Encounter for screening for cardiovascular disorders: Secondary | ICD-10-CM | POA: Diagnosis not present

## 2018-07-05 DIAGNOSIS — Z1212 Encounter for screening for malignant neoplasm of rectum: Secondary | ICD-10-CM

## 2018-07-05 DIAGNOSIS — Z0000 Encounter for general adult medical examination without abnormal findings: Secondary | ICD-10-CM | POA: Diagnosis not present

## 2018-07-05 DIAGNOSIS — Z23 Encounter for immunization: Secondary | ICD-10-CM

## 2018-07-05 DIAGNOSIS — E0822 Diabetes mellitus due to underlying condition with diabetic chronic kidney disease: Secondary | ICD-10-CM

## 2018-07-05 DIAGNOSIS — Z8249 Family history of ischemic heart disease and other diseases of the circulatory system: Secondary | ICD-10-CM

## 2018-07-05 DIAGNOSIS — Z1211 Encounter for screening for malignant neoplasm of colon: Secondary | ICD-10-CM

## 2018-07-05 DIAGNOSIS — I10 Essential (primary) hypertension: Secondary | ICD-10-CM | POA: Diagnosis not present

## 2018-07-05 DIAGNOSIS — Z79899 Other long term (current) drug therapy: Secondary | ICD-10-CM

## 2018-07-05 DIAGNOSIS — N182 Chronic kidney disease, stage 2 (mild): Secondary | ICD-10-CM

## 2018-07-05 DIAGNOSIS — Z0001 Encounter for general adult medical examination with abnormal findings: Secondary | ICD-10-CM

## 2018-07-05 DIAGNOSIS — E039 Hypothyroidism, unspecified: Secondary | ICD-10-CM

## 2018-07-05 NOTE — Patient Instructions (Addendum)
We Do NOT Approve of  [facility] [place] Soliciting Our Patients  To Do Home Visits & We Do NOT Approve of [employer] > > > > > > > > > > > > > > > > > > > > > > > > > > > > > > > > > > > > > > >  Preventive Care for Adults  A healthy lifestyle and preventive care can promote health and wellness. Preventive health guidelines for women include the following key practices.  A routine yearly physical is a good way to check with your health care provider about your health and preventive screening. It is a chance to share any concerns and updates on your health and to receive a thorough exam.  Visit your dentist for a routine exam and preventive care every 6 months. Brush your teeth twice a day and floss once a day. Good oral hygiene prevents tooth decay and gum disease.  The frequency of eye exams is based on your age, health, family medical history, use of contact lenses, and other factors. Follow your health care provider's recommendations for frequency of eye exams.  Eat a healthy diet. Foods like vegetables, fruits, whole grains, low-fat dairy products, and lean protein foods contain the nutrients you need without too many calories. Decrease your intake of foods high in solid fats, added sugars, and salt. Eat the right amount of calories for you. Get information about a proper diet from your health care provider, if necessary.  Regular physical exercise is one of the most important things you can do for your health. Most adults should get at least 150 minutes of moderate-intensity exercise (any activity that increases your heart rate and causes you to sweat) each week. In addition, most adults need muscle-strengthening exercises on 2 or more days a week.  Maintain a healthy weight. The body mass index (BMI) is a screening tool to identify possible weight problems. It provides an estimate of body fat based on height and weight. Your health care provider can find your BMI  and can help you achieve or maintain a healthy weight. For adults 20 years and older:  A BMI below 18.5 is considered underweight.  A BMI of 18.5 to 24.9 is normal.  A BMI of 25 to 29.9 is considered overweight.  A BMI of 30 and above is considered obese.  Maintain normal blood lipids and cholesterol levels by exercising and minimizing your intake of saturated fat. Eat a balanced diet with plenty of fruit and vegetables. If your lipid or cholesterol levels are high, you are over 50, or you are at high risk for heart disease, you may need your cholesterol levels checked more frequently. Ongoing high lipid and cholesterol levels should be treated with medicines if diet and exercise are not working.  If you smoke, find out from your health care provider how to quit. If you do not use tobacco, do not start.  Lung cancer screening is recommended for adults aged 55-80 years who are at high risk for developing lung cancer because of a history of smoking. A yearly low-dose CT scan of the lungs is recommended for people who have at least a 30-pack-year history of smoking and are a current smoker or have quit within the past 15 years. A pack year of smoking is smoking an average of 1 pack of cigarettes a day for 1 year (for example: 1 pack a day for 30 years or 2 packs a day   for 15 years). Yearly screening should continue until the smoker has stopped smoking for at least 15 years. Yearly screening should be stopped for people who develop a health problem that would prevent them from having lung cancer treatment.  Avoid use of street drugs. Do not share needles with anyone. Ask for help if you need support or instructions about stopping the use of drugs.  High blood pressure causes heart disease and increases the risk of stroke.  Ongoing high blood pressure should be treated with medicines if weight loss and exercise do not work.  If you are 29-34 years old, ask your health care provider if you should take  aspirin to prevent strokes.  Diabetes screening involves taking a blood sample to check your fasting blood sugar level. This should be done once every 3 years, after age 27, if you are within normal weight and without risk factors for diabetes. Testing should be considered at a younger age or be carried out more frequently if you are overweight and have at least 1 risk factor for diabetes.  Breast cancer screening is essential preventive care for women. You should practice "breast self-awareness." This means understanding the normal appearance and feel of your breasts and may include breast self-examination. Any changes detected, no matter how small, should be reported to a health care provider. Women in their 64s and 30s should have a clinical breast exam (CBE) by a health care provider as part of a regular health exam every 1 to 3 years. After age 69, women should have a CBE every year. Starting at age 6, women should consider having a mammogram (breast X-ray test) every year. Women who have a family history of breast cancer should talk to their health care provider about genetic screening. Women at a high risk of breast cancer should talk to their health care providers about having an MRI and a mammogram every year.  Breast cancer gene (BRCA)-related cancer risk assessment is recommended for women who have family members with BRCA-related cancers. BRCA-related cancers include breast, ovarian, tubal, and peritoneal cancers. Having family members with these cancers may be associated with an increased risk for harmful changes (mutations) in the breast cancer genes BRCA1 and BRCA2. Results of the assessment will determine the need for genetic counseling and BRCA1 and BRCA2 testing.  Routine pelvic exams to screen for cancer are no longer recommended for nonpregnant women who are considered low risk for cancer of the pelvic organs (ovaries, uterus, and vagina) and who do not have symptoms. Ask your health  care provider if a screening pelvic exam is right for you.  If you have had past treatment for cervical cancer or a condition that could lead to cancer, you need Pap tests and screening for cancer for at least 20 years after your treatment. If Pap tests have been discontinued, your risk factors (such as having a new sexual partner) need to be reassessed to determine if screening should be resumed. Some women have medical problems that increase the chance of getting cervical cancer. In these cases, your health care provider may recommend more frequent screening and Pap tests.    Colorectal cancer can be detected and often prevented. Most routine colorectal cancer screening begins at the age of 21 years and continues through age 8 years. However, your health care provider may recommend screening at an earlier age if you have risk factors for colon cancer. On a yearly basis, your health care provider may provide home test kits to check  for hidden blood in the stool. Use of a small camera at the end of a tube, to directly examine the colon (sigmoidoscopy or colonoscopy), can detect the earliest forms of colorectal cancer. Talk to your health care provider about this at age 94, when routine screening begins.  Direct exam of the colon should be repeated every 5-10 years through age 53 years, unless early forms of pre-cancerous polyps or small growths are found.  Osteoporosis is a disease in which the bones lose minerals and strength with aging. This can result in serious bone fractures or breaks. The risk of osteoporosis can be identified using a bone density scan. Women ages 35 years and over and women at risk for fractures or osteoporosis should discuss screening with their health care providers. Ask your health care provider whether you should take a calcium supplement or vitamin D to reduce the rate of osteoporosis.  Menopause can be associated with physical symptoms and risks. Hormone replacement therapy  is available to decrease symptoms and risks. You should talk to your health care provider about whether hormone replacement therapy is right for you.  Use sunscreen. Apply sunscreen liberally and repeatedly throughout the day. You should seek shade when your shadow is shorter than you. Protect yourself by wearing long sleeves, pants, a wide-brimmed hat, and sunglasses year round, whenever you are outdoors.  Once a month, do a whole body skin exam, using a mirror to look at the skin on your back. Tell your health care provider of new moles, moles that have irregular borders, moles that are larger than a pencil eraser, or moles that have changed in shape or color.  Stay current with required vaccines (immunizations).  Influenza vaccine. All adults should be immunized every year.  Tetanus, diphtheria, and acellular pertussis (Td, Tdap) vaccine. Pregnant women should receive 1 dose of Tdap vaccine during each pregnancy. The dose should be obtained regardless of the length of time since the last dose. Immunization is preferred during the 27th-36th week of gestation. An adult who has not previously received Tdap or who does not know her vaccine status should receive 1 dose of Tdap. This initial dose should be followed by tetanus and diphtheria toxoids (Td) booster doses every 10 years. Adults with an unknown or incomplete history of completing a 3-dose immunization series with Td-containing vaccines should begin or complete a primary immunization series including a Tdap dose. Adults should receive a Td booster every 10 years.    Zoster vaccine. One dose is recommended for adults aged 11 years or older unless certain conditions are present.    Pneumococcal 13-valent conjugate (PCV13) vaccine. When indicated, a person who is uncertain of her immunization history and has no record of immunization should receive the PCV13 vaccine. An adult aged 39 years or older who has certain medical conditions and has not  been previously immunized should receive 1 dose of PCV13 vaccine. This PCV13 should be followed with a dose of pneumococcal polysaccharide (PPSV23) vaccine. The PPSV23 vaccine dose should be obtained at least 1 or more year(s) after the dose of PCV13 vaccine. An adult aged 67 years or older who has certain medical conditions and previously received 1 or more doses of PPSV23 vaccine should receive 1 dose of PCV13. The PCV13 vaccine dose should be obtained 1 or more years after the last PPSV23 vaccine dose.    Pneumococcal polysaccharide (PPSV23) vaccine. When PCV13 is also indicated, PCV13 should be obtained first. All adults aged 62 years and older should  be immunized. An adult younger than age 65 years who has certain medical conditions should be immunized. Any person who resides in a nursing home or long-term care facility should be immunized. An adult smoker should be immunized. People with an immunocompromised condition and certain other conditions should receive both PCV13 and PPSV23 vaccines. People with human immunodeficiency virus (HIV) infection should be immunized as soon as possible after diagnosis. Immunization during chemotherapy or radiation therapy should be avoided. Routine use of PPSV23 vaccine is not recommended for American Indians, [place] Natives, or people younger than 65 years unless there are medical conditions that require PPSV23 vaccine. When indicated, people who have unknown immunization and have no record of immunization should receive PPSV23 vaccine. One-time revaccination 5 years after the first dose of PPSV23 is recommended for people aged 19-64 years who have chronic kidney failure, nephrotic syndrome, asplenia, or immunocompromised conditions. People who received 1-2 doses of PPSV23 before age 65 years should receive another dose of PPSV23 vaccine at age 65 years or later if at least 5 years have passed since the previous dose. Doses of PPSV23 are not needed for people immunized  with PPSV23 at or after age 65 years.   Preventive Services / Frequency  Ages 65 years and over  Blood pressure check.  Lipid and cholesterol check.  Lung cancer screening. / Every year if you are aged 55-80 years and have a 30-pack-year history of smoking and currently smoke or have quit within the past 15 years. Yearly screening is stopped once you have quit smoking for at least 15 years or develop a health problem that would prevent you from having lung cancer treatment.  Clinical breast exam.** / Every year after age 40 years.   BRCA-related cancer risk assessment.** / For women who have family members with a BRCA-related cancer (breast, ovarian, tubal, or peritoneal cancers).  Mammogram.** / Every year beginning at age 40 years and continuing for as long as you are in good health. Consult with your health care provider.  Pap test.** / Every 3 years starting at age 30 years through age 65 or 70 years with 3 consecutive normal Pap tests. Testing can be stopped between 65 and 70 years with 3 consecutive normal Pap tests and no abnormal Pap or HPV tests in the past 10 years.  Fecal occult blood test (FOBT) of stool. / Every year beginning at age 50 years and continuing until age 75 years. You may not need to do this test if you get a colonoscopy every 10 years.  Flexible sigmoidoscopy or colonoscopy.** / Every 5 years for a flexible sigmoidoscopy or every 10 years for a colonoscopy beginning at age 50 years and continuing until age 75 years.  Hepatitis C blood test.** / For all people born from 1945 through 1965 and any individual with known risks for hepatitis C.  Osteoporosis screening.** / A one-time screening for women ages 65 years and over and women at risk for fractures or osteoporosis.  Skin self-exam. / Monthly.  Influenza vaccine. / Every year.  Tetanus, diphtheria, and acellular pertussis (Tdap/Td) vaccine.** / 1 dose of Td every 10 years.  Zoster vaccine.** / 1 dose  for adults aged 60 years or older.  Pneumococcal 13-valent conjugate (PCV13) vaccine.** / Consult your health care provider.  Pneumococcal polysaccharide (PPSV23) vaccine.** / 1 dose for all adults aged 65 years and older. Screening for abdominal aortic aneurysm (AAA)  by ultrasound is recommended for people who have history of high blood pressure   or who are current or former smokers. ++++++++++++++++++++ Recommend Adult Low Dose Aspirin or  coated  Aspirin 81 mg daily  To reduce risk of Colon Cancer 20 %,  Skin Cancer 26 % ,  Melanoma 46%  and  Pancreatic cancer 60% ++++++++++++++++++++ Vitamin D goal  is between 70-100.  Please make sure that you are taking your Vitamin D as directed.  It is very important as a natural anti-inflammatory  helping hair, skin, and nails, as well as reducing stroke and heart attack risk.  It helps your bones and helps with mood. It also decreases numerous cancer risks so please take it as directed.  Low Vit D is associated with a 200-300% higher risk for CANCER  and 200-300% higher risk for HEART   ATTACK  &  STROKE.   .....................................Marland Kitchen It is also associated with higher death rate at younger ages,  autoimmune diseases like Rheumatoid arthritis, Lupus, Multiple Sclerosis.    Also many other serious conditions, like depression, Alzheimer's Dementia, infertility, muscle aches, fatigue, fibromyalgia - just to name a few. ++++++++++++++++++ Recommend the book "The END of DIETING" by Dr Excell Seltzer  & the book "The END of DIABETES " by Dr Excell Seltzer At [email or web address] - get book & Audio CD's    Being diabetic has a  300% increased risk for heart attack, stroke, cancer, and alzheimer- type vascular dementia. It is very important that you work harder with diet by avoiding all foods that are white. Avoid white rice (brown & wild rice is OK), white potatoes (sweetpotatoes in moderation is OK), White bread or wheat bread or anything made out of  white flour like bagels, donuts, rolls, buns, biscuits, cakes, pastries, cookies, pizza crust, and pasta (made from white flour & egg whites) - vegetarian pasta or spinach or wheat pasta is OK. Multigrain breads like Arnold's or Pepperidge [employer], or multigrain sandwich thins or flatbreads.  Diet, exercise and weight loss can reverse and cure diabetes in the early stages.  Diet, exercise and weight loss is very important in the control and prevention of complications of diabetes which affects every system in your body, ie. Brain - dementia/stroke, eyes - glaucoma/blindness, heart - heart attack/heart failure, kidneys - dialysis, stomach - gastric paralysis, intestines - malabsorption, nerves - severe painful neuritis, circulation - gangrene & loss of a leg(s), and finally cancer and Alzheimers.    I recommend avoid fried & greasy foods,  sweets/candy, white rice (brown or wild rice or Quinoa is OK), white potatoes (sweet potatoes are OK) - anything made from white flour - bagels, doughnuts, rolls, buns, biscuits,white and wheat breads, pizza crust and traditional pasta made of white flour & egg white(vegetarian pasta or spinach or wheat pasta is OK).  Multi-grain bread is OK - like multi-grain flat bread or sandwich thins. Avoid alcohol in excess. Exercise is also important.    Eat all the vegetables you want - avoid meat, especially red meat and dairy - especially cheese.  Cheese is the most concentrated form of trans-fats which is the worst thing to clog up our arteries. Veggie cheese is OK which can be found in the fresh produce section at [employer] or [employer] or [employer]  +++++++++++++++++++ DASH Eating Plan  DASH stands for "Dietary Approaches to Stop Hypertension."   The DASH eating plan is a healthy eating plan that has been shown to reduce high blood pressure (hypertension). Additional health benefits may include reducing the risk of type 2 diabetes mellitus, heart disease,  and stroke. The  DASH eating plan may also help with weight loss. WHAT DO I NEED TO KNOW ABOUT THE DASH EATING PLAN? For the DASH eating plan, you will follow these general guidelines:  Choose foods with a percent daily value for sodium of less than 5% (as listed on the food label).  Use salt-free seasonings or herbs instead of table salt or sea salt.  Check with your health care provider or pharmacist before using salt substitutes.  Eat lower-sodium products, often labeled as "lower sodium" or "no salt added."  Eat fresh foods.  Eat more vegetables, fruits, and low-fat dairy products.  Choose whole grains. Look for the word "whole" as the first word in the ingredient list.  Choose fish   Limit sweets, desserts, sugars, and sugary drinks.  Choose heart-healthy fats.  Eat veggie cheese   Eat more home-cooked food and less restaurant, buffet, and fast food.  Limit fried foods.  Cook foods using methods other than frying.  Limit canned vegetables. If you do use them, rinse them well to decrease the sodium.  When eating at a restaurant, ask that your food be prepared with less salt, or no salt if possible.                      WHAT FOODS CAN I EAT? Read Dr Fara Olden Fuhrman's books on The End of Dieting & The End of Diabetes  Grains Whole grain or whole wheat bread. Brown rice. Whole grain or whole wheat pasta. Quinoa, bulgur, and whole grain cereals. Low-sodium cereals. Corn or whole wheat flour tortillas. Whole grain cornbread. Whole grain crackers. Low-sodium crackers.  Vegetables Fresh or frozen vegetables (raw, steamed, roasted, or grilled). Low-sodium or reduced-sodium tomato and vegetable juices. Low-sodium or reduced-sodium tomato sauce and paste. Low-sodium or reduced-sodium canned vegetables.   Fruits All fresh, canned (in natural juice), or frozen fruits.  Protein Products  All fish and seafood.  Dried beans, peas, or lentils. Unsalted nuts and seeds. Unsalted canned  beans.  Dairy Low-fat dairy products, such as skim or 1% milk, 2% or reduced-fat cheeses, low-fat ricotta or cottage cheese, or plain low-fat yogurt. Low-sodium or reduced-sodium cheeses.  Fats and Oils Tub margarines without trans fats. Light or reduced-fat mayonnaise and salad dressings (reduced sodium). Avocado. Safflower, olive, or canola oils. Natural peanut or almond butter.  Other Unsalted popcorn and pretzels. The items listed above may not be a complete list of recommended foods or beverages. Contact your dietitian for more options.  +++++++++++++++  WHAT FOODS ARE NOT RECOMMENDED? Grains/ White flour or wheat flour White bread. White pasta. White rice. Refined cornbread. Bagels and croissants. Crackers that contain trans fat.  Vegetables  Creamed or fried vegetables. Vegetables in a . Regular canned vegetables. Regular canned tomato sauce and paste. Regular tomato and vegetable juices.  Fruits Dried fruits. Canned fruit in light or heavy syrup. Fruit juice.  Meat and Other Protein Products Meat in general - RED meat & White meat.  Fatty cuts of meat. Ribs, chicken wings, all processed meats as bacon, sausage, bologna, salami, fatback, hot dogs, bratwurst and packaged luncheon meats.  Dairy Whole or 2% milk, cream, half-and-half, and cream cheese. Whole-fat or sweetened yogurt. Full-fat cheeses or blue cheese. Non-dairy creamers and whipped toppings. Processed cheese, cheese spreads, or cheese curds.  Condiments Onion and garlic salt, seasoned salt, table salt, and sea salt. Canned and packaged gravies. Worcestershire sauce. Tartar sauce. Barbecue sauce. Teriyaki sauce. Soy sauce, including reduced  sodium. Steak sauce. Fish sauce. Oyster sauce. Cocktail sauce. Horseradish. Ketchup and mustard. Meat flavorings and tenderizers. Bouillon cubes. Hot sauce. Tabasco sauce. Marinades. Taco seasonings. Relishes.  Fats and Oils Butter, stick margarine, lard, shortening and bacon  fat. Coconut, palm kernel, or palm oils. Regular salad dressings.  Pickles and olives. Salted popcorn and pretzels.  The items listed above may not be a complete list of foods and beverages to avoid.    

## 2018-07-05 NOTE — Progress Notes (Signed)
[facility] [name], M.D.     Uvaldo Bristle. Silverio Lay, P.A.-C Liane Comber, [place], [place] [address] Telephone [phone] Telefax [phone] Annual Screening/Preventative Visit & Comprehensive Evaluation &  Examination     This very nice 77 y.o. [name] presents for a Screening /Preventative Visit & comprehensive evaluation and management of multiple medical co-morbidities.  Patient has been followed for HTN, HLD, T2_NIDDM  Prediabetes  and Vitamin D Deficiency.     Patient has hx/o Lt Breast Cancer in 2006 followed by adjuvant Tamoxifen x 5 years.  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         HTN predates since     . Patient's BP has been controlled at home and patient denies any cardiac symptoms as chest pain, palpitations, shortness of breath, dizziness or ankle swelling. Today's BP is at goal - 136/78.      Patient's hyperlipidemia is controlled with diet and medications. Patient denies myalgias or other medication SE's. 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  She reports CBG's range betw 90-110 mg% and last A1c was not at goal: Lab Results  Component Value Date   HGBA1C 6.0 (H) 03/22/2018      Patient has been on Thyroid Replacement since 2003.     Finally, patient has history of Vitamin D Deficiency and last Vitamin D was at goal: Lab Results  Component Value Date   VD25OH 63 12/10/2017   Current  Outpatient Medications on File Prior to Visit  Medication Sig  . acetaminophen (TYLENOL) 500 MG tablet Take 500 mg by mouth daily as needed for moderate pain or headache.  Marland Kitchen aspirin 81 MG tablet Take 81 mg by mouth daily.    . Calcium Carbonate-Vitamin D (CALCIUM 600 + D PO) Take 1 tablet by mouth 2 (two) times daily.   . Cholecalciferol (VITAMIN D) 2000 units CAPS Take 2,000 Units by mouth every other day.  . levothyroxine (SYNTHROID, LEVOTHROID) 88 MCG tablet Take 1 tablet (88 mcg total) by mouth daily before breakfast.  . loratadine-pseudoephedrine (CLARITIN-D 24 HOUR) 10-240 MG 24 hr tablet Take 1 tablet by mouth daily as needed for allergies.  . metFORMIN (GLUCOPHAGE-XR) 500 MG 24 hr tablet TAKE ONE TABLET BY MOUTH WITH BREAKFAST AND LUNCH AND TAKE TWO TABLETS BY MOUTH WITH SUPPER FOR DAIBETES.  . Multiple Vitamins-Minerals (MULTIVITAMIN PO) Take 1 tablet by mouth daily.   . pravastatin (PRAVACHOL) 40 MG tablet Take 1 tablet (40 mg total) by mouth every evening. (Patient taking differently: Take 40 mg by mouth every evening. Take 1/2 tablet by mouth daily)  . traZODone (DESYREL) 100 MG tablet Take 1 tablet (100 mg total) by mouth at bedtime.  . triamterene-hydrochlorothiazide (MAXZIDE) 75-50 MG tablet TAKE 1/2 TABLET BY MOUTH ONCE DAILY FOR BLOOD PRESSURE AND  FLUID   No current facility-administered medications on file prior to visit.    Allergies  Allergen Reactions  . Calan [Verapamil]     Constipation   . Effexor [Venlafaxine]     unknown  . Erythromycin Nausea And Vomiting  . Femara [Letrozole] Swelling  . Fosamax [Alendronate]     aching  . Ibuprofen     Dizziness, incoherent   . Paxil [Paroxetine Hcl] Nausea Only  . Remeron [Mirtazapine]     Weight gain   . Tamoxifen     Hair loss @ low dose  . Vasotec [Enalapril] Cough   Past Medical History:  Diagnosis Date  . Avascular necrosis of bones of both hips ([place]) 11/17/2016  . Breast cancer ([place]) 2006   left breast  .  Depression   . Diabetes ([place])    borderline  . Family history of cancer   . History of brachytherapy 03/29/17-04/12/17   vaginal cuff 18 Gy in 3 fractions  . History of radiation therapy 02/10/17-03/16/17   pelvis 45 Gy in 25 fractions  . Hyperlipidemia   . Hypertension   . Iron deficiency anemia   . Vitamin D deficiency    Health Maintenance  Topic Date Due  . PNA vac Low Risk Adult (2 of 2 - PCV13) 12/28/2012  . FOOT EXAM  05/27/2018  . URINE MICROALBUMIN  05/27/2018  . INFLUENZA VACCINE  06/23/2018  . HEMOGLOBIN A1C  09/21/2018  . OPHTHALMOLOGY EXAM  10/05/2018  . MAMMOGRAM  11/19/2018  . COLONOSCOPY  03/10/2021  . TETANUS/TDAP  12/28/2021  . DEXA SCAN  Completed   Immunization History  Administered Date(s) Administered  . Influenza, High Dose Seasonal PF 09/07/2017  . Influenza,inj,Quad PF,6+ Mos 11/12/2016  . Pneumococcal-Unspecified 12/29/2011  . Td 12/29/2011   Last Colon -  2017 - Dr Earlean Shawl  (adenomatous polyps) & recc  5 yr f/u in 2022.  Last MGM - 11/19/2017  Past Surgical History:  Procedure Laterality Date  . BREAST LUMPECTOMY Left 2006  . BREAST SURGERY  2006   left breast lumpectomy- radiation, took Femara   . 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 ROBOTIC ASSISTED LAP VAGINAL HYSTERECTOMY N/A 10/13/2016   Procedure: XI ROBOTIC ASSISTED LAPAROSCOPIC VAGINAL HYSTERECTOMY;  Surgeon: Nancy Marus, MD;  Location: [facility];  Service: Gynecology;  Laterality: N/A;  . ROBOTIC ASSISTED SALPINGO OOPHERECTOMY Bilateral 10/13/2016   Procedure: XI ROBOTIC ASSISTED SALPINGO OOPHORECTOMY;  Surgeon: Nancy Marus, MD;  Location: [facility];  Service: Gynecology;  Laterality: Bilateral;  . SENTINEL NODE BIOPSY N/A 10/13/2016   Procedure: SENTINEL NODE BIOPSY;  Surgeon: Nancy Marus, MD;  Location: [facility];  Service: Gynecology;  Laterality: N/A;   Family History  Problem Relation Age of Onset  . Heart  disease Mother   . Heart attack Mother   . Cancer Father   . Heart attack Father   . Prostate cancer Father 23       deceased 47  . Breast cancer Paternal Aunt 4       deceased 54s  . Prostate cancer Paternal Uncle 70       deceased 11  . Cancer Paternal Grandmother        unsure; deceased 36s  . Prostate cancer Brother 37       currently 69  . Prostate cancer Brother 49       currently 21   Social History   Tobacco Use  . Smoking status: Never Smoker  . Smokeless tobacco: Never Used  Substance  Use Topics  . Alcohol use: No  . Drug use: No    ROS Constitutional: Denies fever, chills, weight loss/gain, headaches, insomnia,  night sweats, and change in appetite. Does c/o fatigue. Eyes: Denies redness, blurred vision, diplopia, discharge, itchy, watery eyes.  ENT: Denies discharge, congestion, post nasal drip, epistaxis, sore throat, earache, hearing loss, dental pain, Tinnitus, Vertigo, Sinus pain, snoring.  Cardio: Denies chest pain, palpitations, irregular heartbeat, syncope, dyspnea, diaphoresis, orthopnea, PND, claudication, edema Respiratory: denies cough, dyspnea, DOE, pleurisy, hoarseness, laryngitis, wheezing.  Gastrointestinal: Denies dysphagia, heartburn, reflux, water brash, pain, cramps, nausea, vomiting, bloating, diarrhea, constipation, hematemesis, melena, hematochezia, jaundice, hemorrhoids Genitourinary: Denies dysuria, frequency, urgency, nocturia, hesitancy, discharge, hematuria, flank pain Breast: Breast lumps, nipple discharge, bleeding.  Musculoskeletal: Denies arthralgia, myalgia, stiffness, Jt. Swelling, pain, limp, and strain/sprain. Denies falls. Skin: Denies puritis, rash, hives, warts, acne, eczema, changing in skin lesion Neuro: No weakness, tremor, incoordination, spasms, paresthesia, pain Psychiatric: Denies confusion, memory loss, sensory loss. Denies Depression. Endocrine: Denies change in weight, skin, hair change, nocturia, and paresthesia,  diabetic polys, visual blurring, hyper / hypo glycemic episodes.  Heme/Lymph: No excessive bleeding, bruising, enlarged lymph nodes.  Physical Exam  BP 136/78   Pulse 84   Temp (!) 97 F (36.1 C)   Resp 16   Ht 5\' 2"  (1.575 m)   Wt 139 lb 6.4 oz (63.2 kg)   BMI 25.50 kg/m   General Appearance: Well nourished, well groomed and in no apparent distress.  Eyes: PERRLA, EOMs, conjunctiva no swelling or erythema, normal fundi and vessels. Sinuses: No frontal/maxillary tenderness ENT/Mouth: EACs patent / TMs  nl. Nares clear without erythema, swelling, mucoid exudates. Oral hygiene is good. No erythema, swelling, or exudate. Tongue normal, non-obstructing. Tonsils not swollen or erythematous. Hearing normal.  Neck: Supple, thyroid not palpable. No bruits, nodes or JVD. Respiratory: Respiratory effort normal.  BS equal and clear bilateral without rales, rhonci, wheezing or stridor. Cardio: Heart sounds are normal with regular rate and rhythm and no murmurs, rubs or gallops. Peripheral pulses are normal and equal bilaterally without edema. No aortic or femoral bruits. Chest: symmetric with normal excursions and percussion. Breasts: Symmetric, without lumps, nipple discharge, retractions, or fibrocystic changes.  Abdomen: Flat, soft with bowel sounds active. Nontender, no guarding, rebound, hernias, masses, or organomegaly.  Lymphatics: Non tender without lymphadenopathy.  Musculoskeletal: Full ROM all peripheral extremities, joint stability, 5/5 strength, and normal gait. Skin: Warm and dry without rashes, lesions, cyanosis, clubbing or  ecchymosis.  Neuro: Cranial nerves intact, reflexes equal bilaterally. Normal muscle tone, no cerebellar symptoms. Sensation intact to touch, vibratory and Monofilament to the toes bilaterally.  Pysch: Alert and oriented X 3, normal affect, Insight and Judgment appropriate.   Assessment and Plan  1. Annual Preventative Screening Examination  2. Essential  hypertension  - CBC with Differential/Platelet - COMPLETE METABOLIC PANEL WITH GFR - TSH - Magnesium - Urinalysis, Routine w reflex microscopic - Microalbumin / creatinine urine ratio - EKG 12-Lead  3. Hyperlipidemia, mixed  - Lipid panel  4. 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 Vitamin D deficiency  - VITAMIN D 25 Hydroxyl  6. Hypothyroidism  - TSH  7. Screening for colorectal cancer   8. Screening for ischemic heart disease  - EKG 12-Lead  9. FHx: heart disease  - EKG 12-Lead  10. Medication management  - CBC with Differential/Platelet - COMPLETE METABOLIC PANEL WITH GFR -  TSH - Lipid panel - Hemoglobin A1c - Magnesium - VITAMIN D 25 Hydroxyl - Urinalysis, Routine w reflex microscopic - Microalbumin / creatinine urine ratio  11. Need for prophylactic vaccination against Streptococcus pneumoniae (pneumococcus)  - Pneumococcal conjugate vaccine 13-valent      Patient was counseled in prudent diet to achieve/maintain BMI less than 25 for weight control, BP monitoring, regular exercise and medications. Discussed med's effects and SE's. Screening labs and tests as requested with regular follow-up as recommended. Over 40 minutes of exam, counseling, chart review and high complex critical decision making was performed.

## 2018-07-06 LAB — LIPID PANEL
Cholesterol: 142 mg/dL (ref <200)
HDL: 52 mg/dL (ref >50)
LDL Cholesterol (Calc): 70 mg/dL (calc)
NON-HDL CHOLESTEROL (CALC): 90 mg/dL (ref <130)
TRIGLYCERIDES: 115 mg/dL (ref <150)
Total CHOL/HDL Ratio: 2.7 (calc) (ref <5.0)

## 2018-07-06 LAB — COMPLETE METABOLIC PANEL WITH GFR
AG Ratio: 1.8 (calc) (ref 1.0–2.5)
ALBUMIN MSPROF: 4.6 g/dL (ref 3.6–5.1)
ALKALINE PHOSPHATASE (APISO): 84 U/L (ref 33–130)
ALT: 9 U/L (ref 6–29)
AST: 13 U/L (ref 10–35)
BILIRUBIN TOTAL: 0.5 mg/dL (ref 0.2–1.2)
BUN: 9 mg/dL (ref 7–25)
CHLORIDE: 96 mmol/L — AB (ref 98–110)
CO2: 28 mmol/L (ref 20–32)
Calcium: 9.9 mg/dL (ref 8.6–10.4)
Creat: 0.63 mg/dL (ref 0.60–0.93)
GFR, EST AFRICAN AMERICAN: 100 mL/min/{1.73_m2} (ref >=60)
GFR, Est Non African American: 87 mL/min/{1.73_m2} (ref >=60)
Globulin: 2.5 g/dL (calc) (ref 1.9–3.7)
Glucose, Bld: 86 mg/dL (ref 65–99)
POTASSIUM: 3.7 mmol/L (ref 3.5–5.3)
Sodium: 137 mmol/L (ref 135–146)
TOTAL PROTEIN: 7.1 g/dL (ref 6.1–8.1)

## 2018-07-06 LAB — CBC WITH DIFFERENTIAL/PLATELET
BASOS PCT: 0.5 %
Basophils Absolute: 22 cells/uL (ref 0–200)
EOS ABS: 9 {cells}/uL — AB (ref 15–500)
Eosinophils Relative: 0.2 %
HCT: 40.2 % (ref 35.0–45.0)
HEMOGLOBIN: 13.1 g/dL (ref 11.7–15.5)
Lymphs Abs: 1226 cells/uL (ref 850–3900)
MCH: 25.4 pg — AB (ref 27.0–33.0)
MCHC: 32.6 g/dL (ref 32.0–36.0)
MCV: 77.9 fL — ABNORMAL LOW (ref 80.0–100.0)
MPV: 9.7 fL (ref 7.5–12.5)
Monocytes Relative: 8.6 %
NEUTROS ABS: 2675 {cells}/uL (ref 1500–7800)
Neutrophils Relative %: 62.2 %
Platelets: 384 10*3/uL (ref 140–400)
RBC: 5.16 10*6/uL — ABNORMAL HIGH (ref 3.80–5.10)
RDW: 14.1 % (ref 11.0–15.0)
Total Lymphocyte: 28.5 %
WBC mixed population: 370 cells/uL (ref 200–950)
WBC: 4.3 10*3/uL (ref 3.8–10.8)

## 2018-07-06 LAB — URINALYSIS, ROUTINE W REFLEX MICROSCOPIC
BACTERIA UA: NONE SEEN /HPF
Bilirubin Urine: NEGATIVE
Glucose, UA: NEGATIVE
HGB URINE DIPSTICK: NEGATIVE
HYALINE CAST: NONE SEEN /LPF
KETONES UR: NEGATIVE
Nitrite: NEGATIVE
Protein, ur: NEGATIVE
SPECIFIC GRAVITY, URINE: 1.011 (ref 1.001–1.03)
Squamous Epithelial / LPF: NONE SEEN /HPF (ref <=5)
WBC, UA: NONE SEEN /HPF (ref 0–5)
pH: 7 (ref 5.0–8.0)

## 2018-07-06 LAB — HEMOGLOBIN A1C
HEMOGLOBIN A1C: 5.9 %{Hb} — AB (ref <5.7)
MEAN PLASMA GLUCOSE: 123 (calc)
eAG (mmol/L): 6.8 (calc)

## 2018-07-06 LAB — MICROALBUMIN / CREATININE URINE RATIO
CREATININE, URINE: 74 mg/dL (ref 20–275)
MICROALB UR: 0.2 mg/dL
Microalb Creat Ratio: 3 mcg/mg creat (ref <30)

## 2018-07-06 LAB — VITAMIN D 25 HYDROXY (VIT D DEFICIENCY, FRACTURES): Vit D, 25-Hydroxy: 92 ng/mL (ref 30–100)

## 2018-07-06 LAB — MAGNESIUM: MAGNESIUM: 1.9 mg/dL (ref 1.5–2.5)

## 2018-07-06 LAB — TSH: TSH: 0.64 m[IU]/L (ref 0.40–4.50)

## 2018-07-09 ENCOUNTER — Encounter: Payer: Self-pay | Admitting: Internal Medicine

## 2018-08-08 ENCOUNTER — Ambulatory Visit: Payer: Medicare Other | Admitting: Internal Medicine

## 2018-08-08 ENCOUNTER — Encounter: Payer: Self-pay | Admitting: Internal Medicine

## 2018-08-08 VITALS — BP 110/68 | HR 84 | Temp 97.8°F | Resp 16 | Ht 62.0 in | Wt 139.8 lb

## 2018-08-08 DIAGNOSIS — M5442 Lumbago with sciatica, left side: Secondary | ICD-10-CM

## 2018-08-08 MED ORDER — TRAMADOL HCL 50 MG PO TABS: ORAL_TABLET | ORAL | 0 refills | Status: DC

## 2018-08-08 MED ORDER — PREDNISONE 20 MG PO TABS: ORAL_TABLET | ORAL | 0 refills | Status: DC

## 2018-08-08 NOTE — Progress Notes (Signed)
Subjective:    Patient ID: Tammy Hensley, female    DOB: 12-Mar-1941, 77 y.o.   MRN: [id number]  HPI    This very nice 77 yo [name] presents with a 3-4 day hx/o increasing Low Left pain and associated Lt sciatica limiting activity. She denies any injury.  Outpatient Medications Prior to Visit  Medication Sig  . Acetaminophen 500 MG tablet Take 500 mg h daily as needed for moderate pain or headache.  Marland Kitchen aspirin 81 MG tablet Take  daily.    Marland Kitchen CALCIUM 600 + D Take 1 tablet  2  times daily.   Marland Kitchen VITAMIN D 2000 units  Take 2,000 Unitsevery other day.  . levothyroxine  88 MCG tablet Take 1 tablet  daily before breakfast.  . CLARITIN-D 24   Take 1 tablet daily as needed for allergies.  . metFORMIN-XR 500 MG TAKE 1 tab w/Bkfst & Lunch & 2 tab w/Supper   . Multi-Vit-Minerals  Take 1 tablet  daily.   . Pravastatin 40 MG  Take 1/2 tablet  daily  . traZODone 100 MG Take 1 tablet  at bedtime.  Marland Kitchen MAXZIDE 75-50 MG  TAKE 1/2 TAB  DAILY    Allergies  Allergen Reactions  . Calan [Verapamil]     Constipation   . Effexor [Venlafaxine]     unknown  . Erythromycin Nausea And Vomiting  . Femara [Letrozole] Swelling  . Fosamax [Alendronate]     aching  . Ibuprofen     Dizziness, incoherent   . Paxil [Paroxetine Hcl] Nausea Only  . Remeron [Mirtazapine]     Weight gain   . Tamoxifen     Hair loss @ low dose  . Vasotec [Enalapril] Cough   Past Medical History:  Diagnosis Date  . Avascular necrosis of bones of both hips ([facility]) 11/17/2016  . Breast cancer ([place]) 2006   left breast  . Depression   . Diabetes ([place])    borderline  . Family history of cancer   . History of brachytherapy 03/29/17-04/12/17   vaginal cuff 18 Gy in 3 fractions  . History of radiation therapy 02/10/17-03/16/17   pelvis 45 Gy in 25 fractions  . Hyperlipidemia   . Hypertension   . Iron deficiency anemia   . Vitamin D deficiency    Past Surgical History:  Procedure Laterality Date  . BREAST LUMPECTOMY Left 2006  .  BREAST SURGERY  2006   left breast lumpectomy- radiation, took Femara   . 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 ROBOTIC ASSISTED LAP VAGINAL HYSTERECTOMY N/A 10/13/2016   Procedure: XI ROBOTIC ASSISTED LAPAROSCOPIC VAGINAL HYSTERECTOMY;  Surgeon: Nancy Marus, MD;  Location: [facility];  Service: Gynecology;  Laterality: N/A;  . ROBOTIC ASSISTED SALPINGO OOPHERECTOMY Bilateral 10/13/2016   Procedure: XI ROBOTIC ASSISTED SALPINGO OOPHORECTOMY;  Surgeon: Nancy Marus, MD;  Location: [facility];  Service: Gynecology;  Laterality: Bilateral;  . SENTINEL NODE BIOPSY N/A 10/13/2016   Procedure: SENTINEL NODE BIOPSY;  Surgeon: Nancy Marus, MD;  [facility];  Service: Gynecology;  Laterality: N/A;   Review of Systems     10 point systems review negative except as above.    Objective:   Physical Exam  BP 110/68   Pulse 84   Temp 97.8 F (36.6 C)   Resp 16   Ht 5\' 2"  (1.575 m)   Wt 139 lb 12.8 oz (63.4 kg)  BMI 25.57 kg/m   HEENT - WNL. Neck - supple.  Chest - Clear equal BS. Cor - Nl HS. RRR w/o sig MGR. PP 1(+). No edema. MS- FROM w/o deformities.  (+) tender over Lt Hip bursa.  +/- SLR Left. Neg hip fig 4- sl limping gait favoring the LLE Neuro -  Nl w/o focal abnormalities.    Assessment & Plan:   1. Acute left-sided low back pain with left-sided sciatica  - predniSONE  20 MG tablet; 1 tab 3 x day for 3 days, then 1 tab 2 x day for 3 days, then 1 tab 1 x day for 5 days  Dispense: 20 tablet  - traMADol ( 50 MG tablet; Take 1/2 to 1 tablet every 4 hours as needed for severe Back Pain  Dispense: 30 tablet

## 2018-08-09 ENCOUNTER — Other Ambulatory Visit (INDEPENDENT_AMBULATORY_CARE_PROVIDER_SITE_OTHER): Payer: Medicare Other

## 2018-08-09 DIAGNOSIS — Z1211 Encounter for screening for malignant neoplasm of colon: Secondary | ICD-10-CM | POA: Diagnosis not present

## 2018-08-09 LAB — POC HEMOCCULT BLD/STL (HOME/3-CARD/SCREEN)
Card #3 Fecal Occult Blood, POC: NEGATIVE
FECAL OCCULT BLD: NEGATIVE
Fecal Occult Blood, POC: NEGATIVE

## 2018-08-24 ENCOUNTER — Telehealth: Payer: Self-pay | Admitting: *Deleted

## 2018-08-24 ENCOUNTER — Other Ambulatory Visit: Payer: Self-pay | Admitting: Internal Medicine

## 2018-08-24 DIAGNOSIS — M5442 Lumbago with sciatica, left side: Secondary | ICD-10-CM

## 2018-08-24 MED ORDER — GABAPENTIN 300 MG PO CAPS: ORAL_CAPSULE | ORAL | 1 refills | Status: DC

## 2018-08-24 NOTE — Telephone Encounter (Signed)
The patient called and states that when she finished her Prednisone, her leg pain returned.  She was tearful and states she is having difficulty walking.  Dr [name], an RX for Gabapentin was sent to her pharmacy and an order for a lumbar spine MRI was put in.  The patient is aware.

## 2018-08-25 ENCOUNTER — Ambulatory Visit
Admission: RE | Admit: 2018-08-25 | Discharge: 2018-08-25 | Disposition: A | Discharge status: Home / Self Care | Payer: Medicare Other | Source: Ambulatory Visit | Attending: Internal Medicine | Admitting: Internal Medicine

## 2018-08-25 DIAGNOSIS — M5442 Lumbago with sciatica, left side: Secondary | ICD-10-CM

## 2018-08-26 ENCOUNTER — Telehealth: Payer: Self-pay | Admitting: *Deleted

## 2018-08-26 ENCOUNTER — Other Ambulatory Visit: Payer: Self-pay | Admitting: Internal Medicine

## 2018-08-26 ENCOUNTER — Encounter: Payer: Self-pay | Admitting: Oncology

## 2018-08-26 ENCOUNTER — Other Ambulatory Visit: Payer: Self-pay | Admitting: Hematology and Oncology

## 2018-08-26 ENCOUNTER — Telehealth: Payer: Self-pay | Admitting: Oncology

## 2018-08-26 ENCOUNTER — Encounter: Payer: Self-pay | Admitting: Radiation Oncology

## 2018-08-26 DIAGNOSIS — C50412 Malignant neoplasm of upper-outer quadrant of left female breast: Secondary | ICD-10-CM

## 2018-08-26 DIAGNOSIS — C541 Malignant neoplasm of endometrium: Secondary | ICD-10-CM

## 2018-08-26 DIAGNOSIS — Z170 Estrogen receptor positive status [ER+]: Principal | ICD-10-CM

## 2018-08-26 DIAGNOSIS — C801 Malignant (primary) neoplasm, unspecified: Secondary | ICD-10-CM

## 2018-08-26 DIAGNOSIS — C7951 Secondary malignant neoplasm of bone: Secondary | ICD-10-CM

## 2018-08-26 DIAGNOSIS — C50919 Malignant neoplasm of unspecified site of unspecified female breast: Secondary | ICD-10-CM

## 2018-08-26 MED ORDER — OXYCODONE-ACETAMINOPHEN 7.5-325 MG PO TABS: ORAL_TABLET | ORAL | 0 refills | Status: DC

## 2018-08-26 NOTE — Progress Notes (Signed)
Histology and Location of Primary Cancer: Endometrial cancer ([place])  Sites of Visceral and Bony Metastatic Disease:  CT Chest/Abdomen/Pelvis with contrast pending for 08/29/18 IMPRESSION: 1. 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 2. New irregular nodular opacity in the medial right middle lobe, new since 2017 CT. Separate subcentimeter right upper lobe solid pulmonary nodule, for which no comparison exists. These findings are indeterminate for pulmonary metastases and attention on follow-up chest CT in 3 months is recommended. 3. No lymphadenopathy or additional findings of metastatic disease. No tumor recurrence at the hysterectomy margin. 4. Aortic Atherosclerosis (ICD10-I70.0). Additional chronic findings as detailed IMPRESSION: 1. 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 2. New irregular nodular opacity in the medial right middle lobe, new since 2017 CT. Separate subcentimeter right upper lobe solid pulmonary nodule, for which no comparison exists. These findings are indeterminate for pulmonary metastases and attention on follow-up chest CT in 3 months is recommended. 3. No lymphadenopathy or additional findings of metastatic disease. No tumor recurrence at the hysterectomy margin. 4. Aortic Atherosclerosis (ICD10-I70.0). Additional chronic findings as detailed.  Location(s) of Symptomatic Metastases: Per MRI lumbar spine 08/25/18:  IMPRESSION: Large destructive mass lesion left iliac bone with large soft tissue mass extending into the posterior soft tissues and pelvis compatible with metastatic disease. Consider follow-up CT chest abdomen pelvis with contrast for further staging.  Past/Anticipated chemotherapy by medical oncology, if any: Dr. Alvy Bimler appt 08/30/18 pending biopsy on Tuesday 09/06/18  Pain on a scale of 0-10 is: 8/10 with pain medications. Left hip radiating down the leg to the  knee.   If Spine Met(s), symptoms, if any, include:  Bowel/Bladder retention or incontinence (please describe): Pt c/o constipation, relieved by Miralax. Pt has minimal urinary incontinence, which is not new.   Numbness or weakness in extremities (please describe): No  Current Decadron regimen, if applicable: '4mg'$  twice daily  Ambulatory status? Walker? Wheelchair?: Ambulatory with Assistance  SAFETY ISSUES: Prior radiation? Radiation treatment dates:  02/10/17-03/16/17; 03/29/17-04/12/17  Site/dose: 1) Pelvis/ 45 Gy in 25 fractions                         2) Vaginal Cuff/ 18 Gy in 3 fractions  Beams/energy:  1) IMRT / 6xFFF                                      2) HDR Ir-192 vaginal/ Iridium 192  Pacemaker/ICD? No  Possible current pregnancy? N/A, pt is post-surgical  Is the patient on methotrexate? No  Current Complaints / other details:  Pt presents for reconsult with Dr. Sondra Come for Radiation Oncology. Pt is accompanied by husband.   BP (!) 145/74 (BP Location: Right Arm, Patient Position: Sitting)   Pulse 96   Temp 98.2 F (36.8 C) (Oral)   Resp 16   Ht '5\' 2"'$  (1.575 m)   Wt 139 lb (63 kg)   SpO2 100%   BMI 25.42 kg/m   Wt Readings from Last 3 Encounters:  08/31/18 139 lb (63 kg)  08/30/18 137 lb 12.8 oz (62.5 kg)  08/08/18 139 lb 12.8 oz (63.4 kg)   Loma Sousa, RN BSN

## 2018-08-26 NOTE — Telephone Encounter (Signed)
Per Dr [name], the pharmacist was informed the patient is to start with 1/2 tablet of the Oxycodone 7.5/325. The diagnosis is metastatic bone cancer, per Dr [name].  The pharmacist is aware.

## 2018-08-26 NOTE — Telephone Encounter (Signed)
Called Tammy Hensley and notified her of appointments for labs on 08/29/18 at 9 am, CT scan (NPO 4 hours before, contrast at 8 and 9) at 10 am, apt with Dr. Alvy Bimler on 08/30/18 at 10:30 and Dr. Sondra Come on 08/31/18 at 10 am nurse, 10:30 am Dr. Sondra Come.  She verbalized understanding and agreement.  Also discussed how she is doing with pain.  She said the pain started in her left side radiating down her leg about 2 weeks ago.  She was given prednisone by Dr. [name] which did help for awhile.  She said Dr. [name] has now called in a prescription for Percocet and will pick it up today.  Advised her to call if she needs anything.

## 2018-08-29 ENCOUNTER — Ambulatory Visit (HOSPITAL_COMMUNITY)
Admission: RE | Admit: 2018-08-29 | Discharge: 2018-08-29 | Disposition: A | Discharge status: Home / Self Care | Payer: Medicare Other | Source: Ambulatory Visit | Attending: Hematology and Oncology | Admitting: Hematology and Oncology

## 2018-08-29 ENCOUNTER — Inpatient Hospital Stay: Payer: Medicare Other | Attending: Hematology and Oncology

## 2018-08-29 ENCOUNTER — Ambulatory Visit: Payer: Medicare Other | Admitting: Hematology and Oncology

## 2018-08-29 DIAGNOSIS — K5909 Other constipation: Secondary | ICD-10-CM | POA: Insufficient documentation

## 2018-08-29 DIAGNOSIS — C541 Malignant neoplasm of endometrium: Secondary | ICD-10-CM | POA: Insufficient documentation

## 2018-08-29 DIAGNOSIS — C50412 Malignant neoplasm of upper-outer quadrant of left female breast: Secondary | ICD-10-CM

## 2018-08-29 DIAGNOSIS — C50912 Malignant neoplasm of unspecified site of left female breast: Secondary | ICD-10-CM | POA: Diagnosis not present

## 2018-08-29 DIAGNOSIS — Z170 Estrogen receptor positive status [ER+]: Secondary | ICD-10-CM | POA: Insufficient documentation

## 2018-08-29 DIAGNOSIS — R911 Solitary pulmonary nodule: Secondary | ICD-10-CM | POA: Diagnosis not present

## 2018-08-29 DIAGNOSIS — C7951 Secondary malignant neoplasm of bone: Secondary | ICD-10-CM | POA: Insufficient documentation

## 2018-08-29 DIAGNOSIS — G893 Neoplasm related pain (acute) (chronic): Secondary | ICD-10-CM | POA: Diagnosis not present

## 2018-08-29 DIAGNOSIS — C7800 Secondary malignant neoplasm of unspecified lung: Secondary | ICD-10-CM | POA: Diagnosis not present

## 2018-08-29 DIAGNOSIS — I700 Atherosclerosis of aorta: Secondary | ICD-10-CM | POA: Diagnosis not present

## 2018-08-29 DIAGNOSIS — Z23 Encounter for immunization: Secondary | ICD-10-CM | POA: Diagnosis not present

## 2018-08-29 DIAGNOSIS — Z79899 Other long term (current) drug therapy: Secondary | ICD-10-CM | POA: Insufficient documentation

## 2018-08-29 DIAGNOSIS — R64 Cachexia: Secondary | ICD-10-CM | POA: Diagnosis not present

## 2018-08-29 DIAGNOSIS — I10 Essential (primary) hypertension: Secondary | ICD-10-CM | POA: Insufficient documentation

## 2018-08-29 LAB — CBC WITH DIFFERENTIAL/PLATELET
Basophils Absolute: 0 10*3/uL (ref 0.0–0.1)
Basophils Relative: 1 %
Eosinophils Absolute: 0 10*3/uL (ref 0.0–0.5)
Eosinophils Relative: 1 %
HEMATOCRIT: 39.9 % (ref 34.8–46.6)
HEMOGLOBIN: 13.2 g/dL (ref 11.6–15.9)
LYMPHS ABS: 0.9 10*3/uL (ref 0.9–3.3)
LYMPHS PCT: 20 %
MCH: 26.1 pg (ref 25.1–34.0)
MCHC: 33.2 g/dL (ref 31.5–36.0)
MCV: 78.4 fL — AB (ref 79.5–101.0)
MONOS PCT: 8 %
Monocytes Absolute: 0.3 10*3/uL (ref 0.1–0.9)
NEUTROS ABS: 3.1 10*3/uL (ref 1.5–6.5)
NEUTROS PCT: 70 %
Platelets: 290 10*3/uL (ref 145–400)
RBC: 5.08 MIL/uL (ref 3.70–5.45)
RDW: 15.4 % — ABNORMAL HIGH (ref 11.2–14.5)
WBC: 4.3 10*3/uL (ref 3.9–10.3)

## 2018-08-29 LAB — COMPREHENSIVE METABOLIC PANEL
ALT: 10 U/L (ref 0–44)
AST: 18 U/L (ref 15–41)
Albumin: 3.7 g/dL (ref 3.5–5.0)
Alkaline Phosphatase: 76 U/L (ref 38–126)
Anion gap: 13 (ref 5–15)
BILIRUBIN TOTAL: 0.5 mg/dL (ref 0.3–1.2)
BUN: 10 mg/dL (ref 8–23)
CALCIUM: 10.4 mg/dL — AB (ref 8.9–10.3)
CO2: 25 mmol/L (ref 22–32)
CREATININE: 0.69 mg/dL (ref 0.44–1.00)
Chloride: 99 mmol/L (ref 98–111)
GFR calc Af Amer: >60 mL/min (ref >60)
Glucose, Bld: 102 mg/dL — ABNORMAL HIGH (ref 70–99)
POTASSIUM: 3.9 mmol/L (ref 3.5–5.1)
Sodium: 137 mmol/L (ref 135–145)
TOTAL PROTEIN: 7.3 g/dL (ref 6.5–8.1)

## 2018-08-29 LAB — LACTATE DEHYDROGENASE: LDH: 301 U/L — ABNORMAL HIGH (ref 98–192)

## 2018-08-29 MED ORDER — SODIUM CHLORIDE 0.9 % IJ SOLN
INTRAMUSCULAR | Status: AC
  Filled 2018-08-29: qty 50

## 2018-08-29 MED ORDER — IOHEXOL 300 MG/ML  SOLN
100.0000 mL | Freq: Once | INTRAMUSCULAR | Status: AC | PRN
  Administered 2018-08-29: 100 mL via INTRAVENOUS

## 2018-08-30 ENCOUNTER — Inpatient Hospital Stay (HOSPITAL_BASED_OUTPATIENT_CLINIC_OR_DEPARTMENT_OTHER): Payer: Medicare Other | Admitting: Hematology and Oncology

## 2018-08-30 ENCOUNTER — Telehealth: Payer: Self-pay | Admitting: Hematology and Oncology

## 2018-08-30 ENCOUNTER — Telehealth: Payer: Self-pay

## 2018-08-30 ENCOUNTER — Encounter: Payer: Self-pay | Admitting: Hematology and Oncology

## 2018-08-30 VITALS — BP 129/69 | HR 101 | Temp 98.4°F | Resp 18 | Ht 62.0 in | Wt 137.8 lb

## 2018-08-30 DIAGNOSIS — C7800 Secondary malignant neoplasm of unspecified lung: Secondary | ICD-10-CM | POA: Insufficient documentation

## 2018-08-30 DIAGNOSIS — Z299 Encounter for prophylactic measures, unspecified: Secondary | ICD-10-CM

## 2018-08-30 DIAGNOSIS — G893 Neoplasm related pain (acute) (chronic): Secondary | ICD-10-CM

## 2018-08-30 DIAGNOSIS — C7801 Secondary malignant neoplasm of right lung: Secondary | ICD-10-CM | POA: Diagnosis not present

## 2018-08-30 DIAGNOSIS — C541 Malignant neoplasm of endometrium: Secondary | ICD-10-CM | POA: Diagnosis not present

## 2018-08-30 DIAGNOSIS — C50412 Malignant neoplasm of upper-outer quadrant of left female breast: Secondary | ICD-10-CM | POA: Diagnosis not present

## 2018-08-30 DIAGNOSIS — I10 Essential (primary) hypertension: Secondary | ICD-10-CM

## 2018-08-30 DIAGNOSIS — C7951 Secondary malignant neoplasm of bone: Secondary | ICD-10-CM | POA: Diagnosis not present

## 2018-08-30 DIAGNOSIS — Z170 Estrogen receptor positive status [ER+]: Principal | ICD-10-CM

## 2018-08-30 DIAGNOSIS — R64 Cachexia: Secondary | ICD-10-CM

## 2018-08-30 DIAGNOSIS — K5909 Other constipation: Secondary | ICD-10-CM

## 2018-08-30 DIAGNOSIS — R5381 Other malaise: Secondary | ICD-10-CM

## 2018-08-30 LAB — CA 125: Cancer Antigen (CA) 125: 17.4 U/mL (ref 0.0–38.1)

## 2018-08-30 MED ORDER — DEXAMETHASONE 4 MG PO TABS: 4.0000 mg | ORAL_TABLET | Freq: Two times a day (BID) | ORAL | 0 refills | Status: DC

## 2018-08-30 NOTE — Assessment & Plan Note (Signed)
I have reviewed multiple CT imaging with the patient and family The cause of the abnormal pulmonary lesion and bone lesion is unknown Given her history of breast cancer and uterine cancer, I felt that biopsy is indicated I plan to order CT-guided biopsy as soon as possible before she start radiation treatment In the meantime, we will continue aggressive supportive care

## 2018-08-30 NOTE — Telephone Encounter (Signed)
Family member called and left a message. They forgot to get Rx for Tramadol today during visit.

## 2018-08-30 NOTE — Assessment & Plan Note (Signed)
She has poor appetite and recent weight loss, likely due to active cancer We discussed the importance of frequent small meals Hopefully, the dexamethasone will improve her appetite

## 2018-08-30 NOTE — Assessment & Plan Note (Signed)
I plan to reassess her overall performance status next week with all the medications as above If she is feeling stronger with better pain control, I will initiate physical therapy at home

## 2018-08-30 NOTE — Assessment & Plan Note (Signed)
She has severe cancer associated pain and is not willing to take narcotic prescription due to profound sensitivity to side effects of treatment I recommend scheduled tramadol, scheduled dexamethasone twice a day, along with gabapentin I will reassess pain control in the next visit

## 2018-08-30 NOTE — Telephone Encounter (Signed)
Gave pt avs and calendar  °

## 2018-08-30 NOTE — Assessment & Plan Note (Signed)
Her breast examination revealed well-healed lumpectomy scar On CT scan of the chest imaging, I did not see anything suspicious but it is not the best test to look for evidence of cancer within the breast Due to propensity of breast cancer causing bone metastasis, I recommend CT-guided biopsy of the bone lesion and she agree with the plan of care

## 2018-08-30 NOTE — Assessment & Plan Note (Signed)
She has history of hypertension With recent weight loss, her blood pressure is now near normal/low I recommend consideration to hold her diuretic therapy to reduce the risk of dizziness and fall

## 2018-08-30 NOTE — Assessment & Plan Note (Signed)
She has a very large bone lesion in the left iliac area I will order CT-guided biopsy to find out the cause of this

## 2018-08-30 NOTE — Telephone Encounter (Signed)
Called back and talked with son. They are out of Tramadol. Dr. Alvy Bimler can sent Rx to [facility] on [facility] [address] per son. Told son Rx would be sent tomorrow. He verbalized understanding.

## 2018-08-30 NOTE — Assessment & Plan Note (Signed)
We discussed the importance of preventive care and reviewed the vaccination programs. She does not have any prior allergic reactions to influenza vaccination. She agrees to proceed with influenza vaccination today and we will administer it today at the clinic.  

## 2018-08-30 NOTE — Assessment & Plan Note (Signed)
The lung lesion is very close to the heart border and it is not amenable to biopsy She is not symptomatic Observe only

## 2018-08-30 NOTE — Progress Notes (Signed)
[place] OFFICE PROGRESS NOTE  Patient Care Team: [name], MD as PCP - General (Internal Medicine) [name], MD as Consulting Physician (Gastroenterology) [name], MD as Consulting Physician (Ophthalmology) Magrinat, Virgie Dad, MD as Consulting Physician (Oncology) [name], [name], MD as Consulting Physician (Obstetrics and Gynecology) [name], MD as Consulting Physician (Hematology and Oncology) [name], MD as Consulting Physician (Radiation Oncology)  ASSESSMENT & PLAN:  Endometrial cancer [facility]) I have reviewed multiple CT imaging with the patient and family The cause of the abnormal pulmonary lesion and bone lesion is unknown Given her history of breast cancer and uterine cancer, I felt that biopsy is indicated I plan to order CT-guided biopsy as soon as possible before she start radiation treatment In the meantime, we will continue aggressive supportive care  Breast cancer, left breast [facility]) Her breast examination revealed well-healed lumpectomy scar On CT scan of the chest imaging, I did not see anything suspicious but it is not the best test to look for evidence of cancer within the breast Due to propensity of breast cancer causing bone metastasis, I recommend CT-guided biopsy of the bone lesion and she agree with the plan of care  Metastasis to lung [facility]) The lung lesion is very close to the heart border and it is not amenable to biopsy She is not symptomatic Observe only  Metastasis to bone [facility]) She has a very large bone lesion in the left iliac area I will order CT-guided biopsy to find out the cause of this  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 has severe cancer associated pain and is not willing to take narcotic prescription due to profound sensitivity to side effects of treatment I recommend scheduled tramadol, scheduled dexamethasone twice a day, along with gabapentin I will reassess pain control in the next visit  Malignant  cachexia ([place]) She has poor appetite and recent weight loss, likely due to active cancer We discussed the importance of frequent small meals Hopefully, the dexamethasone will improve her appetite  Hypertension She has history of hypertension With recent weight loss, her blood pressure is now near normal/low I recommend consideration to hold her diuretic therapy to reduce the risk of dizziness and fall  Other constipation She has mild constipation secondary to poor mobility, side effects of prescription pain medicine and others We discussed the importance of regular laxative therapy.  Preventive measure We discussed the importance of preventive care and reviewed the vaccination programs. She does not have any prior allergic reactions to influenza vaccination. She agrees to proceed with influenza vaccination today and we will administer it today at the clinic.   Physical debility I plan to reassess her overall performance status next week with all the medications as above If she is feeling stronger with better pain control, I will initiate physical therapy at home   Orders Placed This Encounter  Procedures  . CT BIOPSY    Standing Status:   Future    Standing Expiration Date:   12/01/2019    Order Specific Question:   Lab orders requested (DO NOT place separate lab orders, these will be automatically ordered during procedure specimen collection):    Answer:   Surgical Pathology    Order Specific Question:   Reason for Exam (SYMPTOM  OR DIAGNOSIS REQUIRED)    Answer:   hx breast ca and uterine cancer, bone lesion    Order Specific Question:   Preferred imaging location?    Answer:   [facility]    Order  Specific Question:   Radiology Contrast Protocol - do NOT remove file path    Answer:   [email or web address]    INTERVAL HISTORY: Please see below for problem oriented charting. She returns with her son and husband for further follow-up She is being  seen urgently due to recent findings of metastatic cancer involving her left ilium Since last time I saw her, she has been feeling well up until approximately 2 weeks ago with sudden onset of severe back pain She was originally prescribed tramadol along with low-dose steroids She underwent imaging study which revealed large mass Since then, her primary care doctor has prescribed gabapentin and prescription narcotic therapy She felt excessively sedated with prescription pain medicine and has refused to take it on a regular basis Gabapentin is not adequate to control her pain She rated her pain at 7 out of 10 today She denies new lymphadenopathy She has mild constipation recently No recent nausea Denies abnormal vaginal bleeding She denies any recent abnormal breast examination, palpable mass, abnormal breast appearance or nipple changes She had recent fall at home.  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 SEE COMMENT.  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 The greatest extent of invasive carcinoma as measured on the needle core biopsy measures 0.6 cm.       Endometrial cancer ([place])   02/03/2002 Pathology Results    1. BENIGN ENDOMETRIAL POLYPS.  2. UTERINE FIBROIDS: PORTIONS OF SMOOTH MUSCLE CONSISTENT WITH BENIGN LEIOMYOMA(S). PORTIONS OF BENIGN, CYSTICALLY ATROPHIC ENDOMETRIUM WITHOUT HYPERPLASIA OR EVIDENCE OF MALIGNANCY. 3. ENDOMETRIAL CURETTAGE: BENIGN ENDOMETRIUM AND SMOOTH MUSCLE.    09/07/2016 Pathology Results    PAP smear positive for malignant cells     09/07/2016 Initial Diagnosis    Patient had postmenopausal bleeding in 08-2016. She had PAP 09-07-16 by PCP Dr [name], which documented carcinoma. She had evaluation by Dr [name] with colposcopy/ ECC/endometrial biopsy documenting serous endometrioid endometrial carcinoma.    09/15/2016 Imaging    Ct imaging showed markedly thickened and heterogeneous endometrial stripe suspicious for endometrial carcinoma in this patient with postmenopausal vaginal bleeding. Cystic lesion in the right ovary cannot be definitively characterized. Pelvic ultrasound is recommended for further evaluation. Aortoiliac atherosclerosis. Avascular necrosis of the femoral heads bilaterally.    10/07/2016 Tumor Marker    Patient's tumor was tested for the following markers: CA125 Results of the tumor marker test revealed 15.1    10/13/2016 Pathology Results    1. Lymph node, sentinel, biopsy, right obturator - THERE IS NO EVIDENCE OF CARCINOMA IN 8 OF 8 LYMPH NODES (0/8). - SEE COMMENT. 2. Lymph node, sentinel, biopsy, left obturator - THERE IS NO EVIDENCE OF CARCINOMA IN 1 OF 1 LYMPH NODE (0/1). - SEE COMMENT. 3. Lymph node, sentinel, biopsy, left para-aortic - THERE IS NO EVIDENCE OF CARCINOMA IN 4 OF 4 LYMPH NODES (0/4). - SEE COMMENT. 4. Uterus +/- tubes/ovaries, neoplastic, cervix - INVASIVE MIXED SEROUS/ENDOMETRIOID ADENOCARCINOMA, MULTIPLE FOCI, FIGO GRADE III, THE LARGEST FOCUS SPANS 8.2 CM. - ADENOCARCINOMA EXTENDS INTO THE OUTER HALF OF THE MYOMETRIUM AND INVOLVES THE STROMA OF THE LOWER UTERINE SEGMENT AND  CERVIX. - LYMPHOVASCULAR INVASION IS IDENTIFIED. - THE SURGICAL RESECTION MARGINS ARE NEGATIVE FOR CARCINOMA. - SEE ONCOLOGY TABLE BELOW. ADDITIONAL FINDINGS: - ENDOMETRIUM: ENDOMETRIOID TYPE POLYP(S), WHICH CONTAIN FOCI OF SIMILAR APPEARING MIXED SEROUS/ENDOMETRIOID ADENOCARCINOMA. - MYOMETRIUM: LEIOMYOMATA. - SEROSA: UNREMARKABLE. - BILATERAL ADNEXA: BENIGN OVARIES AND FALLOPIAN TUBES.     10/13/2016 Surgery    Surgery: Total robotic hysterectomy bilateral salpingo-oophorectomy, bilateral pelvic sentinel lymph node removal, left PA sentinel lymph node removal. Mini-laparotomy for specimen removal Surgeons:  [name], MD; [name], MD  Pathology:  1)Uterus, cervix, bilateral tubes and ovaries 2) Right obturator SLN 3) Left Obturator SLN 4) Left PA SLN Operative findings: 14 week fibroid uterus with one large dominant 6 cm myoma that was palpably calcified. 4 cm right ovarian cyst. + SLN identified in the bilateral obturator spaces, + SLN identified in the left PA space.     12/01/2016 - 05/31/2017 Chemotherapy    She received carboplatin & Taxol. She had 3 cycles of chemo followed by radiation and then 3 more cycles of chemo    01/11/2017 Genetic Testing    Testing was normal and did not reveal a mutation in these genes: The genes tested were the 80 genes on [employer]'s Multi-Cancer panel (ALK, APC, ATM, AXIN2, BAP1, BARD1, BLM, BMPR1A, BRCA1, BRCA2, BRIP1, CASR, CDC73, CDH1, CDK4, CDKN1B, CDKN1C, CDKN2A, CEBPA, CHEK2, DICER1, DIS3L2, EGFR, EPCAM, FH, FLCN, GATA2, GPC3, GREM1, HOXB13,  HRAS, KIT, MAX, MEN1, MET, MITF, MLH1, MSH2, MSH6, MUTYH, NBN, NF1, NF2, PALB2, PDGFRA, PHOX2B, PMS2, POLD1, POLE, POT1, PRKAR1A, PTCH1, PTEN, RAD50, RAD51C, RAD51D, RB1, RECQL4, RET, RUNX1, SDHA, SDHAF2, SDHB, SDHC, SDHD, SMAD4, SMARCA4, SMARCB1, SMARCE1, STK11, SUFU, TERC, TERT, TMEM127, TP53, TSC1, TSC2, VHL, WRN, and WT1).    02/10/2017 - 04/12/2017 Radiation Therapy    02/10/17-03/16/17; 03/29/17-04/12/17;  1) Pelvis/ 45 Gy in 25 fractions  2) Vaginal Cuff/ 18 Gy in 3 fractions    08/26/2018 Imaging    Large destructive mass lesion left iliac bone with large soft tissue mass extending into the posterior soft tissues and pelvis compatible with metastatic disease. Consider follow-up CT chest abdomen pelvis with contrast for further staging.  Radiation changes at L5 and the sacrum. No  lumbar metastatic deposits  Multilevel degenerative changes above.    08/29/2018 Imaging    1. 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 2. New irregular nodular opacity in the medial right middle lobe, new since 2017 CT. Separate subcentimeter right upper lobe solid pulmonary nodule, for which no comparison exists. These findings are indeterminate for pulmonary metastases and attention on follow-up chest CT in 3 months is recommended. 3. No lymphadenopathy or additional findings of metastatic disease.No tumor recurrence at the hysterectomy margin. 4. Aortic Atherosclerosis (ICD10-I70.0). Additional chronic findings as detailed.    08/29/2018 Tumor Marker    Patient's tumor was tested for the following markers: CA-125 Results of the tumor marker test revealed 17.4     REVIEW OF SYSTEMS:   Constitutional: Denies fevers, chills  Eyes: Denies blurriness of vision Ears, nose, mouth, throat, and face: Denies mucositis or sore throat Respiratory: Denies cough, dyspnea or wheezes Cardiovascular: Denies palpitation, chest discomfort or lower extremity swelling Skin: Denies abnormal skin rashes Lymphatics: Denies new lymphadenopathy or easy bruising Behavioral/Psych: Mood is stable, no new changes  All other systems were reviewed with the patient and are negative.  I have reviewed the past medical history, past surgical history, social history and family history with the patient and  they are unchanged from previous note.  ALLERGIES:  is allergic to calan [verapamil]; effexor [venlafaxine]; erythromycin; femara [letrozole]; fosamax [alendronate]; ibuprofen; paxil [paroxetine hcl]; remeron [mirtazapine]; tamoxifen; and vasotec [enalapril].  MEDICATIONS:  Current Outpatient Medications  Medication Sig Dispense Refill  . acetaminophen (TYLENOL) 500 MG tablet Take 500 mg by mouth daily as needed for moderate pain or headache.    Marland Kitchen aspirin 81 MG tablet  Take 81 mg by mouth daily.      . Calcium Carbonate-Vitamin D (CALCIUM 600 + D PO) Take 1 tablet by mouth 2 (two) times daily.     . Cholecalciferol (VITAMIN D) 2000 units CAPS Take 2,000 Units by mouth every other day.    Marland Kitchen dexamethasone (DECADRON) 4 MG tablet Take 1 tablet (4 mg total) by mouth 2 (two) times daily. 60 tablet 0  . gabapentin (NEURONTIN) 300 MG capsule Take 1 capsule 3 x /day for Sciatica pain 90 capsule 1  . levothyroxine (SYNTHROID, LEVOTHROID) 88 MCG tablet Take 1 tablet (88 mcg total) by mouth daily before breakfast. 90 tablet 1  . loratadine-pseudoephedrine (CLARITIN-D 24 HOUR) 10-240 MG 24 hr tablet Take 1 tablet by mouth daily as needed for allergies.    . metFORMIN (GLUCOPHAGE-XR) 500 MG 24 hr tablet TAKE ONE TABLET BY MOUTH WITH BREAKFAST AND LUNCH AND TAKE TWO TABLETS BY MOUTH WITH SUPPER FOR DAIBETES. 360 tablet 1  . Multiple Vitamins-Minerals (MULTIVITAMIN PO) Take 1 tablet by mouth daily.     Marland Kitchen oxyCODONE-acetaminophen (PERCOCET) 7.5-325 MG tablet Take 1/2 to 1 tablet every 4 hours as needed for severe pain 30 tablet 0  . pravastatin (PRAVACHOL) 40 MG tablet Take 1 tablet (40 mg total) by mouth every evening. (Patient taking differently: Take 40 mg by mouth every evening. Take 1/2 tablet by mouth daily) 90 tablet 1  . traMADol (ULTRAM) 50 MG tablet Take 1/2 to 1 tablet every 4 hours as needed for severe Back Pain 30 tablet 0  . traZODone (DESYREL) 100 MG tablet Take 1 tablet (100 mg total) by mouth at bedtime. 30 tablet 11  . triamterene-hydrochlorothiazide (MAXZIDE) 75-50 MG tablet TAKE 1/2 TABLET BY MOUTH ONCE DAILY FOR BLOOD PRESSURE AND  FLUID 90 tablet 3   No current facility-administered medications for this visit.     PHYSICAL EXAMINATION: ECOG PERFORMANCE STATUS: 2 - Symptomatic, <50% confined to bed  Vitals:   08/30/18 1040  BP: 129/69  Pulse: (!) 101  Resp: 18  Temp: 98.4 F (36.9 C)  SpO2: 99%   Filed Weights   08/30/18 1040  Weight: 137 lb 12.8  oz (62.5 kg)    GENERAL:alert, no distress and comfortable SKIN: skin color, texture, turgor are normal, no rashes or significant lesions EYES: normal, Conjunctiva are pink and non-injected, sclera clear OROPHARYNX:no exudate, no erythema and lips, buccal mucosa, and tongue normal  NECK: supple, thyroid normal size, non-tender, without nodularity LYMPH:  no palpable lymphadenopathy in the cervical, axillary or inguinal LUNGS: clear to auscultation and percussion with normal breathing effort HEART: regular rate & rhythm and no murmurs and no lower extremity edema ABDOMEN:abdomen soft, non-tender and normal bowel sounds Musculoskeletal:no cyanosis of digits and no clubbing  NEURO: alert & oriented x 3 with fluent speech, did not assess her gait Bilateral breast examination was performed.  Well-healed lumpectomy scar on the left  LABORATORY DATA:  I have reviewed the data as listed    Component Value Date/Time   NA 137 08/29/2018 0926   NA 137 05/31/2017 0931  K 3.9 08/29/2018 0926   K 3.5 05/31/2017 0931   CL 99 08/29/2018 0926   CL 99 12/20/2012 1336   CO2 25 08/29/2018 0926   CO2 25 05/31/2017 0931   GLUCOSE 102 (H) 08/29/2018 0926   GLUCOSE 161 (H) 05/31/2017 0931   GLUCOSE 110 (H) 12/20/2012 1336   BUN 10 08/29/2018 0926   BUN 10.9 05/31/2017 0931   CREATININE 0.69 08/29/2018 0926   CREATININE 0.63 07/05/2018 1009   CREATININE 0.8 05/31/2017 0931   CALCIUM 10.4 (H) 08/29/2018 0926   CALCIUM 10.6 (H) 05/31/2017 0931   PROT 7.3 08/29/2018 0926   PROT 7.5 05/31/2017 0931   ALBUMIN 3.7 08/29/2018 0926   ALBUMIN 4.2 05/31/2017 0931   AST 18 08/29/2018 0926   AST 16 05/31/2017 0931   ALT 10 08/29/2018 0926   ALT 15 05/31/2017 0931   ALKPHOS 76 08/29/2018 0926   ALKPHOS 91 05/31/2017 0931   BILITOT 0.5 08/29/2018 0926   BILITOT 0.33 05/31/2017 0931   GFRNONAA >60 08/29/2018 0926   GFRNONAA 87 07/05/2018 1009   GFRAA >60 08/29/2018 0926   GFRAA 100 07/05/2018 1009     No results found for: SPEP, UPEP  Lab Results  Component Value Date   WBC 4.3 08/29/2018   NEUTROABS 3.1 08/29/2018   HGB 13.2 08/29/2018   HCT 39.9 08/29/2018   MCV 78.4 (L) 08/29/2018   PLT 290 08/29/2018      Chemistry      Component Value Date/Time   NA 137 08/29/2018 0926   NA 137 05/31/2017 0931   K 3.9 08/29/2018 0926   K 3.5 05/31/2017 0931   CL 99 08/29/2018 0926   CL 99 12/20/2012 1336   CO2 25 08/29/2018 0926   CO2 25 05/31/2017 0931   BUN 10 08/29/2018 0926   BUN 10.9 05/31/2017 0931   CREATININE 0.69 08/29/2018 0926   CREATININE 0.63 07/05/2018 1009   CREATININE 0.8 05/31/2017 0931      Component Value Date/Time   CALCIUM 10.4 (H) 08/29/2018 0926   CALCIUM 10.6 (H) 05/31/2017 0931   ALKPHOS 76 08/29/2018 0926   ALKPHOS 91 05/31/2017 0931   AST 18 08/29/2018 0926   AST 16 05/31/2017 0931   ALT 10 08/29/2018 0926   ALT 15 05/31/2017 0931   BILITOT 0.5 08/29/2018 0926   BILITOT 0.33 05/31/2017 0931       RADIOGRAPHIC STUDIES: I have personally reviewed the radiological images as listed and agreed with the findings in the report. Ct Chest W Contrast  Result Date: 08/29/2018 CLINICAL DATA:  Endometrial cancer status post [facility] 10/13/2016 with chemotherapy and radiation therapy. Remote history of left breast cancer. Restaging. Large left iliac bone mass on recent lumbar spine MRI performed for pain. EXAM: CT CHEST, ABDOMEN, AND PELVIS WITH CONTRAST TECHNIQUE: Multidetector CT imaging of the chest, abdomen and pelvis was performed following the standard protocol during bolus administration of intravenous contrast. CONTRAST:  15m OMNIPAQUE IOHEXOL 300 MG/ML  SOLN COMPARISON:  08/25/2018 lumbar spine MRI. 09/15/2016 CT abdomen/pelvis. FINDINGS: CT CHEST FINDINGS Cardiovascular: Normal heart size. No significant pericardial effusion/thickening. Left main coronary atherosclerosis. Atherosclerotic nonaneurysmal thoracic aorta. Normal caliber pulmonary  arteries. No central pulmonary emboli. Mediastinum/Nodes: Hypodense 2.4 cm left thyroid lobe nodule. Unremarkable esophagus. No pathologically enlarged axillary, mediastinal or hilar lymph nodes. Lungs/Pleura: No pneumothorax. No pleural effusion. No lung masses. Irregular 1.6 cm medial right middle lobe pulmonary nodular opacity (series 5/image 95) is new since 09/15/2016 CT. Right upper lobe solid 5  mm pulmonary nodule (series 5/image 56), for which no comparison exists. No additional significant pulmonary nodules. Mild patchy subpleural reticulation in the anterior lingula is probably from prior radiation therapy. Musculoskeletal: No aggressive appearing focal osseous lesions. Mild thoracic spondylosis. Stable post lumpectomy changes in the outer left breast. CT ABDOMEN PELVIS FINDINGS Hepatobiliary: Normal liver with no liver mass. Normal gallbladder with no radiopaque cholelithiasis. No biliary ductal dilatation. Pancreas: Normal, with no mass or duct dilation. Spleen: Normal size. No mass. Adrenals/Urinary Tract: Stable appearance of the adrenal glands without discrete adrenal nodules. No hydronephrosis. 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 Simple 2.3 cm upper left renal cyst. Subcentimeter hypodense renal cortical lesion in the lower right kidney is too small to characterize and is stable. No new renal lesions. Normal bladder. Stomach/Bowel: Small hiatal hernia. Otherwise normal nondistended stomach. Normal caliber small bowel with no small bowel wall thickening. Normal appendix. Normal large bowel with no diverticulosis, large bowel wall thickening or pericolonic fat stranding. Vascular/Lymphatic: Atherosclerotic nonaneurysmal abdominal aorta. Patent portal, splenic, hepatic and renal veins. No pathologically enlarged lymph nodes in the abdomen or pelvis. Reproductive: Status post hysterectomy, with no abnormal findings at the vaginal cuff. No adnexal mass. Other: No pneumoperitoneum, ascites or  focal fluid collection. Musculoskeletal: Large lytic expansile medial left iliac crest osseous lesion with significant soft tissue component, measuring 10.2 x 5.9 cm (series 2/image 86), new since 2017 CT. No additional focal osseous lesions. Mild lumbar spondylosis. IMPRESSION: 1. 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 2. New irregular nodular opacity in the medial right middle lobe, new since 2017 CT. Separate subcentimeter right upper lobe solid pulmonary nodule, for which no comparison exists. These findings are indeterminate for pulmonary metastases and attention on follow-up chest CT in 3 months is recommended. 3. No lymphadenopathy or additional findings of metastatic disease. No tumor recurrence at the hysterectomy margin. 4. Aortic Atherosclerosis (ICD10-I70.0). Additional chronic findings as detailed. Electronically Signed   By: Ilona Sorrel M.D.   On: 08/29/2018 11:59   Mr Lumbar Spine Wo Contrast  Result Date: 08/26/2018 CLINICAL DATA:  Acute left-sided low back pain with left-sided sciatica. History of breast cancer and endometrial cancer. EXAM: MRI LUMBAR SPINE WITHOUT CONTRAST TECHNIQUE: Multiplanar, multisequence MR imaging of the lumbar spine was performed. No intravenous contrast was administered. COMPARISON:  None. FINDINGS: Segmentation:  Normal Alignment:  Normal Vertebrae: Fatty bone marrow throughout L5 and the sacrum compatible with prior pelvic radiation for endometrial cancer. Large destructive mass in the left iliac bone with large soft tissue mass extending into the pelvis. This is incompletely visualized and accurate measurements are not possible. No other bony mass lesion in the lumbar spine. Conus medullaris and cauda equina: Conus extends to the L1-2 level. Conus and cauda equina appear normal. Paraspinal and other soft tissues: Negative for retroperitoneal adenopathy. Left upper pole simple cyst Disc levels: T12-L1: Small extruded  disc fragment on the left extending caudally medial to the pedicle. This could affect the T12 nerve root but there is no definite nerve root compression or stenosis L1-2: Mild disc degeneration and disc bulging without stenosis L2-3: Small left foraminal disc protrusion which is touching the left L2 nerve root. Mild facet degeneration. Normal spinal canal diameter L3-4: Mild disc degeneration with diffuse bulging of the disc. Small foraminal disc protrusions bilaterally. Mild facet degeneration. L4-5: Disc bulging and mild facet degeneration without significant stenosis L5-S1: Mild disc and mild facet degeneration. IMPRESSION: Large destructive mass lesion left  iliac bone with large soft tissue mass extending into the posterior soft tissues and pelvis compatible with metastatic disease. Consider follow-up CT chest abdomen pelvis with contrast for further staging. Radiation changes at L5 and the sacrum. No lumbar metastatic deposits Multilevel degenerative changes above. These results will be called to the ordering clinician or representative by the Radiologist Assistant, and communication documented in the PACS or zVision Dashboard. Electronically Signed   By: Franchot Gallo M.D.   On: 08/26/2018 08:37   Ct Abdomen Pelvis W Contrast  Result Date: 08/29/2018 CLINICAL DATA:  Endometrial cancer status post [facility] 10/13/2016 with chemotherapy and radiation therapy. Remote history of left breast cancer. Restaging. Large left iliac bone mass on recent lumbar spine MRI performed for pain. EXAM: CT CHEST, ABDOMEN, AND PELVIS WITH CONTRAST TECHNIQUE: Multidetector CT imaging of the chest, abdomen and pelvis was performed following the standard protocol during bolus administration of intravenous contrast. CONTRAST:  158m OMNIPAQUE IOHEXOL 300 MG/ML  SOLN COMPARISON:  08/25/2018 lumbar spine MRI. 09/15/2016 CT abdomen/pelvis. FINDINGS: CT CHEST FINDINGS Cardiovascular: Normal heart size. No significant pericardial  effusion/thickening. Left main coronary atherosclerosis. Atherosclerotic nonaneurysmal thoracic aorta. Normal caliber pulmonary arteries. No central pulmonary emboli. Mediastinum/Nodes: Hypodense 2.4 cm left thyroid lobe nodule. Unremarkable esophagus. No pathologically enlarged axillary, mediastinal or hilar lymph nodes. Lungs/Pleura: No pneumothorax. No pleural effusion. No lung masses. Irregular 1.6 cm medial right middle lobe pulmonary nodular opacity (series 5/image 95) is new since 09/15/2016 CT. Right upper lobe solid 5 mm pulmonary nodule (series 5/image 56), for which no comparison exists. No additional significant pulmonary nodules. Mild patchy subpleural reticulation in the anterior lingula is probably from prior radiation therapy. Musculoskeletal: No aggressive appearing focal osseous lesions. Mild thoracic spondylosis. Stable post lumpectomy changes in the outer left breast. CT ABDOMEN PELVIS FINDINGS Hepatobiliary: Normal liver with no liver mass. Normal gallbladder with no radiopaque cholelithiasis. No biliary ductal dilatation. Pancreas: Normal, with no mass or duct dilation. Spleen: Normal size. No mass. Adrenals/Urinary Tract: Stable appearance of the adrenal glands without discrete adrenal nodules. No hydronephrosis. 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 Simple 2.3 cm upper left renal cyst. Subcentimeter hypodense renal cortical lesion in the lower right kidney is too small to characterize and is stable. No new renal lesions. Normal bladder. Stomach/Bowel: Small hiatal hernia. Otherwise normal nondistended stomach. Normal caliber small bowel with no small bowel wall thickening. Normal appendix. Normal large bowel with no diverticulosis, large bowel wall thickening or pericolonic fat stranding. Vascular/Lymphatic: Atherosclerotic nonaneurysmal abdominal aorta. Patent portal, splenic, hepatic and renal veins. No pathologically enlarged lymph nodes in the abdomen or pelvis. Reproductive:  Status post hysterectomy, with no abnormal findings at the vaginal cuff. No adnexal mass. Other: No pneumoperitoneum, ascites or focal fluid collection. Musculoskeletal: Large lytic expansile medial left iliac crest osseous lesion with significant soft tissue component, measuring 10.2 x 5.9 cm (series 2/image 86), new since 2017 CT. No additional focal osseous lesions. Mild lumbar spondylosis. IMPRESSION: 1. 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 2. New irregular nodular opacity in the medial right middle lobe, new since 2017 CT. Separate subcentimeter right upper lobe solid pulmonary nodule, for which no comparison exists. These findings are indeterminate for pulmonary metastases and attention on follow-up chest CT in 3 months is recommended. 3. No lymphadenopathy or additional findings of metastatic disease. No tumor recurrence at the hysterectomy margin. 4. Aortic Atherosclerosis (ICD10-I70.0). Additional chronic findings as detailed. Electronically Signed   By: JIlona Sorrel  M.D.   On: 08/29/2018 11:59    All questions were answered. The patient knows to call the clinic with any problems, questions or concerns. No barriers to learning was detected.  I spent 45 minutes counseling the patient face to face. The total time spent in the appointment was 70 minutes and more than 50% was on counseling and review of test results  [name], MD 08/30/2018 1:55 PM

## 2018-08-30 NOTE — Telephone Encounter (Signed)
Her primary doctor wrote for 30 tabs If she ran out, let me know which pharmacy and I will escribe

## 2018-08-30 NOTE — Assessment & Plan Note (Signed)
She has mild constipation secondary to poor mobility, side effects of prescription pain medicine and others We discussed the importance of regular laxative therapy.

## 2018-08-31 ENCOUNTER — Encounter: Payer: Self-pay | Admitting: Radiation Oncology

## 2018-08-31 ENCOUNTER — Encounter: Payer: Self-pay | Admitting: Oncology

## 2018-08-31 ENCOUNTER — Ambulatory Visit
Admission: RE | Admit: 2018-08-31 | Discharge: 2018-08-31 | Disposition: A | Discharge status: Home / Self Care | Payer: Medicare Other | Source: Ambulatory Visit | Attending: Radiation Oncology | Admitting: Radiation Oncology

## 2018-08-31 ENCOUNTER — Other Ambulatory Visit: Payer: Self-pay | Admitting: Hematology and Oncology

## 2018-08-31 ENCOUNTER — Other Ambulatory Visit: Payer: Self-pay

## 2018-08-31 VITALS — BP 145/74 | HR 96 | Temp 98.2°F | Resp 16 | Ht 62.0 in | Wt 139.0 lb

## 2018-08-31 DIAGNOSIS — C7951 Secondary malignant neoplasm of bone: Secondary | ICD-10-CM | POA: Diagnosis present

## 2018-08-31 DIAGNOSIS — Z7982 Long term (current) use of aspirin: Secondary | ICD-10-CM | POA: Diagnosis not present

## 2018-08-31 DIAGNOSIS — Z923 Personal history of irradiation: Secondary | ICD-10-CM | POA: Diagnosis not present

## 2018-08-31 DIAGNOSIS — Z881 Allergy status to other antibiotic agents status: Secondary | ICD-10-CM | POA: Insufficient documentation

## 2018-08-31 DIAGNOSIS — Z886 Allergy status to analgesic agent status: Secondary | ICD-10-CM | POA: Insufficient documentation

## 2018-08-31 DIAGNOSIS — Z79890 Hormone replacement therapy (postmenopausal): Secondary | ICD-10-CM | POA: Insufficient documentation

## 2018-08-31 DIAGNOSIS — Z79899 Other long term (current) drug therapy: Secondary | ICD-10-CM | POA: Diagnosis not present

## 2018-08-31 DIAGNOSIS — M5442 Lumbago with sciatica, left side: Secondary | ICD-10-CM

## 2018-08-31 DIAGNOSIS — Z7984 Long term (current) use of oral hypoglycemic drugs: Secondary | ICD-10-CM | POA: Insufficient documentation

## 2018-08-31 MED ORDER — TRAMADOL HCL 50 MG PO TABS: 50.0000 mg | ORAL_TABLET | Freq: Three times a day (TID) | ORAL | 0 refills | Status: DC

## 2018-08-31 NOTE — Telephone Encounter (Signed)
Called and told Rx sent to pharmacy. Husband verbalized understanding.

## 2018-08-31 NOTE — Telephone Encounter (Signed)
done

## 2018-08-31 NOTE — Progress Notes (Signed)
Radiation Oncology         [phone] ________________________________  Name: Tammy Hensley MRN: [id number]  Date: 08/31/2018  DOB: 14-Oct-1941  Follow-Up Visit Note  CC: [name], MD  Everitt Amber, MD    ICD-10-CM   1. 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 SEE COMMENT.  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 The greatest extent of invasive carcinoma as measured on the needle core biopsy measures 0.6 cm.       Endometrial cancer ([place])   02/03/2002 Pathology Results    1. BENIGN ENDOMETRIAL POLYPS.  2. UTERINE FIBROIDS: PORTIONS OF SMOOTH MUSCLE CONSISTENT WITH BENIGN LEIOMYOMA(S). PORTIONS OF BENIGN, CYSTICALLY ATROPHIC ENDOMETRIUM WITHOUT HYPERPLASIA OR EVIDENCE OF MALIGNANCY. 3. ENDOMETRIAL CURETTAGE: BENIGN ENDOMETRIUM AND SMOOTH MUSCLE.    09/07/2016 Pathology Results    PAP smear positive for malignant cells    09/07/2016 Initial Diagnosis    Patient had postmenopausal bleeding in 08-2016. She had PAP 09-07-16 by PCP Dr [name], which documented carcinoma. She had evaluation by Dr [name] with colposcopy/ ECC/endometrial biopsy documenting serous endometrioid endometrial carcinoma.    09/15/2016 Imaging    Ct imaging showed markedly thickened and heterogeneous  endometrial stripe suspicious for endometrial carcinoma in this patient with postmenopausal vaginal bleeding. Cystic lesion in the right ovary cannot be definitively characterized. Pelvic ultrasound is recommended for further evaluation. Aortoiliac atherosclerosis. Avascular necrosis of the femoral heads bilaterally.    10/07/2016 Tumor Marker    Patient's tumor was tested for the following markers: CA125 Results of the tumor marker test revealed 15.1    10/13/2016 Pathology Results    1. Lymph node, sentinel, biopsy, right obturator - THERE IS NO EVIDENCE OF CARCINOMA IN 8 OF 8 LYMPH NODES (0/8). - SEE COMMENT. 2. Lymph node, sentinel, biopsy, left obturator - THERE IS NO EVIDENCE OF CARCINOMA IN 1 OF 1 LYMPH NODE (0/1). - SEE COMMENT. 3. Lymph node, sentinel, biopsy, left para-aortic - THERE IS NO EVIDENCE OF CARCINOMA IN 4 OF 4 LYMPH NODES (0/4). - SEE COMMENT. 4. Uterus +/- tubes/ovaries, neoplastic, cervix - INVASIVE MIXED SEROUS/ENDOMETRIOID ADENOCARCINOMA, MULTIPLE FOCI, FIGO GRADE III, THE LARGEST FOCUS SPANS 8.2 CM. - ADENOCARCINOMA EXTENDS INTO THE OUTER HALF OF THE MYOMETRIUM AND INVOLVES THE STROMA OF THE LOWER UTERINE SEGMENT AND CERVIX. - LYMPHOVASCULAR INVASION IS IDENTIFIED. - THE SURGICAL RESECTION MARGINS ARE NEGATIVE FOR CARCINOMA. - SEE ONCOLOGY TABLE BELOW. ADDITIONAL FINDINGS: - ENDOMETRIUM: ENDOMETRIOID TYPE POLYP(S), WHICH CONTAIN FOCI OF SIMILAR APPEARING MIXED SEROUS/ENDOMETRIOID ADENOCARCINOMA. - MYOMETRIUM: LEIOMYOMATA. - SEROSA: UNREMARKABLE. - BILATERAL ADNEXA: BENIGN OVARIES AND FALLOPIAN TUBES.    10/13/2016 Surgery    Surgery: Total robotic hysterectomy bilateral salpingo-oophorectomy, bilateral pelvic sentinel  lymph node removal, left PA sentinel lymph node removal. Mini-laparotomy for specimen removal Surgeons:  Imagene Gurney A. Alycia Rossetti, MD; [name], MD  Pathology:  1)Uterus, cervix, bilateral tubes and ovaries 2) Right obturator SLN 3) Left  Obturator SLN 4) Left PA SLN Operative findings: 14 week fibroid uterus with one large dominant 6 cm myoma that was palpably calcified. 4 cm right ovarian cyst. + SLN identified in the bilateral obturator spaces, + SLN identified in the left PA space.     12/01/2016 - 05/31/2017 Chemotherapy    She received carboplatin & Taxol. She had 3 cycles of chemo followed by radiation and then 3 more cycles of chemo    01/11/2017 Genetic Testing    Testing was normal and did not reveal a mutation in these genes: The genes tested were the 80 genes on [employer]'s Multi-Cancer panel (ALK, APC, ATM, AXIN2, BAP1, BARD1, BLM, BMPR1A, BRCA1, BRCA2, BRIP1, CASR, CDC73, CDH1, CDK4, CDKN1B, CDKN1C, CDKN2A, CEBPA, CHEK2, DICER1, DIS3L2, EGFR, EPCAM, FH, FLCN, GATA2, GPC3, GREM1, HOXB13,  HRAS, KIT, MAX, MEN1, MET, MITF, MLH1, MSH2, MSH6, MUTYH, NBN, NF1, NF2, PALB2, PDGFRA, PHOX2B, PMS2, POLD1, POLE, POT1, PRKAR1A, PTCH1, PTEN, RAD50, RAD51C, RAD51D, RB1, RECQL4, RET, RUNX1, SDHA, SDHAF2, SDHB, SDHC, SDHD, SMAD4, SMARCA4, SMARCB1, SMARCE1, STK11, SUFU, TERC, TERT, TMEM127, TP53, TSC1, TSC2, VHL, WRN, and WT1).    02/10/2017 - 04/12/2017 Radiation Therapy    02/10/17-03/16/17; 03/29/17-04/12/17;  1) Pelvis/ 45 Gy in 25 fractions  2) Vaginal Cuff/ 18 Gy in 3 fractions    08/26/2018 Imaging    Large destructive mass lesion left iliac bone with large soft tissue mass extending into the posterior soft tissues and pelvis compatible with metastatic disease. Consider follow-up CT chest abdomen pelvis with contrast for further staging.  Radiation changes at L5 and the sacrum. No lumbar metastatic deposits  Multilevel degenerative changes above.    08/29/2018 Imaging    1. 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 2. New irregular nodular opacity in the medial right middle lobe, new since 2017 CT. Separate subcentimeter right upper lobe solid pulmonary nodule, for which no comparison  exists. These findings are indeterminate for pulmonary metastases and attention on follow-up chest CT in 3 months is recommended. 3. No lymphadenopathy or additional findings of metastatic disease.No tumor recurrence at the hysterectomy margin. 4. Aortic Atherosclerosis (ICD10-I70.0). Additional chronic findings as detailed.    08/29/2018 Tumor Marker    Patient's tumor was tested for the following markers: CA-125 Results of the tumor marker test revealed 17.4     Narrative:  The patient returns today for reevaluation and consideration for additional radiation therapy.   She is accompanied by her husband today. She notes that her pain initially began with left knee pain and then left lower back/hip pain that then radiates to her posterior thigh and wraps to her anterior left leg x 2 weeks ago. She was diagnosed with sciatic nerve and given a prednisone, oxycodone, and gabapentin prescription with relief of her symptoms. She took 0.5 tablet three times of the oxycodone and stopped taking it after the first day. She was then given a prescription of tramadol that she will be picking up today. She has associated generalized weakness to her left leg due to pain. She denies any other symptoms. She was recently evaluated by Dr. Alvy Bimler. She has a remote hx of breast cancer 12-14 years ago with anti-hormonal therapy and her Radiation Oncologist was Dr. Sondra Come and her Medical Oncologist was  Dr. Audelia Hives. She has a CT biopsy of her left hip scheduled on 09/06/2018.  Since they were last seen in the office, they had MR Lumbar spine wo contrast on 08/25/2018 that showed: Large destructive mass lesion left iliac bone with large soft tissue mass extending into the posterior soft tissues and pelvis compatible with metastatic disease. Radiation changes at L5 and the sacrum. No lumbar metastatic deposits. Multilevel degenerative changes above.   She also had a CT CAP w contrast on 08/29/2018 that showed: Large lytic  expansile medial left iliac crest osseous metastasis, new since 2017 CT. New irregular nodular opacity in the medial right middle lobe, new since 2017 CT. Separate subcentimeter right upper lobe solid pulmonary nodule, for which no comparison exists. These findings are indeterminate for pulmonary metastases and attention on follow-up chest CT in 3 months is recommended. No lymphadenopathy or additional findings of metastatic disease. No tumor recurrence at the hysterectomy margin. Aortic Atherosclerosis (ICD10-I70.0). Additional chronic findings as detailed.                      ALLERGIES:  is allergic to calan [verapamil]; effexor [venlafaxine]; erythromycin; femara [letrozole]; fosamax [alendronate]; ibuprofen; paxil [paroxetine hcl]; remeron [mirtazapine]; tamoxifen; and vasotec [enalapril].  Meds: Current Outpatient Medications  Medication Sig Dispense Refill  . acetaminophen (TYLENOL) 500 MG tablet Take 500 mg by mouth daily as needed for moderate pain or headache.    Marland Kitchen aspirin 81 MG tablet Take 81 mg by mouth daily.      . Calcium Carbonate-Vitamin D (CALCIUM 600 + D PO) Take 1 tablet by mouth 2 (two) times daily.     . Cholecalciferol (VITAMIN D) 2000 units CAPS Take 2,000 Units by mouth every other day.    Marland Kitchen dexamethasone (DECADRON) 4 MG tablet Take 1 tablet (4 mg total) by mouth 2 (two) times daily. 60 tablet 0  . gabapentin (NEURONTIN) 300 MG capsule Take 1 capsule 3 x /day for Sciatica pain (Patient taking differently: Take 300 mg by mouth 3 (three) times daily. ) 90 capsule 1  . levothyroxine (SYNTHROID, LEVOTHROID) 88 MCG tablet Take 1 tablet (88 mcg total) by mouth daily before breakfast. (Patient taking differently: Take 44 mcg by mouth 4 (four) times a week. Take 0.5 tablet (44 mcg) by mouth on Mondays, Wednesdays, Fridays, & Saturdays in the morning on an empty stomach.) 90 tablet 1  . loratadine-pseudoephedrine (CLARITIN-D 24 HOUR) 10-240 MG 24 hr tablet Take 1 tablet by mouth daily  as needed for allergies.    . metFORMIN (GLUCOPHAGE-XR) 500 MG 24 hr tablet TAKE ONE TABLET BY MOUTH WITH BREAKFAST AND LUNCH AND TAKE TWO TABLETS BY MOUTH WITH SUPPER FOR DAIBETES. (Patient taking differently: Take 500-1,000 mg by mouth See admin instructions. Take 1 tablet (500 mg) by mouth daily before breakfast, 1 tablet (500 mg) by mouth daily before lunch, & take 2 tablets (1000 mg) by mouth daily before supper.) 360 tablet 1  . oxyCODONE-acetaminophen (PERCOCET) 7.5-325 MG tablet Take 1/2 to 1 tablet every 4 hours as needed for severe pain (Patient taking differently: Take 0.5-1 tablets by mouth every 4 (four) hours as needed (for severe pain.). Take 1/2 to 1 tablet every 4 hours as needed for severe pain) 30 tablet 0  . pravastatin (PRAVACHOL) 40 MG tablet Take 1 tablet (40 mg total) by mouth every evening. (Patient taking differently: Take 20 mg by mouth every evening. ) 90 tablet 1  . traMADol (ULTRAM) 50 MG tablet Take 1  tablet (50 mg total) by mouth 3 (three) times daily. Take 1/2 to 1 tablet every 4 hours as needed for severe Back Pain (Patient taking differently: Take 25-50 mg by mouth every 4 (four) hours as needed (for pain.). Take 1/2 to 1 tablet every 4 hours as needed for severe Back Pain) 90 tablet 0  . traZODone (DESYREL) 100 MG tablet Take 1 tablet (100 mg total) by mouth at bedtime. (Patient not taking: Reported on 08/31/2018) 30 tablet 11  . triamterene-hydrochlorothiazide (MAXZIDE) 75-50 MG tablet TAKE 1/2 TABLET BY MOUTH ONCE DAILY FOR BLOOD PRESSURE AND  FLUID (Patient taking differently: Take 0.5 tablets by mouth daily. ) 90 tablet 3  . loratadine (CLARITIN) 10 MG tablet Take 10 mg by mouth daily as needed for allergies.    . Multiple Vitamin (MULTIVITAMIN WITH MINERALS) TABS tablet Take 1 tablet by mouth daily.     No current facility-administered medications for this encounter.     Physical Findings: The patient is in no acute distress. Patient is alert and oriented.   height is '5\' 2"'$  (1.575 m) and weight is 139 lb (63 kg). Her oral temperature is 98.2 F (36.8 C). Her blood pressure is 145/74 (abnormal) and her pulse is 96. Her respiration is 16 and oxygen saturation is 100%.   Lungs are clear to auscultation bilaterally. Heart has regular rate and rhythm. No palpable cervical, supraclavicular, or axillary adenopathy. Abdomen soft, non-tender, normal bowel sounds. Patient is very tender with palpation in the left buttocks region which would correspond to where her soft tissue mass is located. Pt appears to have some weakness in the proximal muscle groups of her LLE.   Lab Findings: Lab Results  Component Value Date   WBC 4.3 08/29/2018   HGB 13.2 08/29/2018   HCT 39.9 08/29/2018   MCV 78.4 (L) 08/29/2018   PLT 290 08/29/2018    Radiographic Findings: Ct Chest W Contrast  Result Date: 08/29/2018 CLINICAL DATA:  Endometrial cancer status post [facility] 10/13/2016 with chemotherapy and radiation therapy. Remote history of left breast cancer. Restaging. Large left iliac bone mass on recent lumbar spine MRI performed for pain. EXAM: CT CHEST, ABDOMEN, AND PELVIS WITH CONTRAST TECHNIQUE: Multidetector CT imaging of the chest, abdomen and pelvis was performed following the standard protocol during bolus administration of intravenous contrast. CONTRAST:  125m OMNIPAQUE IOHEXOL 300 MG/ML  SOLN COMPARISON:  08/25/2018 lumbar spine MRI. 09/15/2016 CT abdomen/pelvis. FINDINGS: CT CHEST FINDINGS Cardiovascular: Normal heart size. No significant pericardial effusion/thickening. Left main coronary atherosclerosis. Atherosclerotic nonaneurysmal thoracic aorta. Normal caliber pulmonary arteries. No central pulmonary emboli. Mediastinum/Nodes: Hypodense 2.4 cm left thyroid lobe nodule. Unremarkable esophagus. No pathologically enlarged axillary, mediastinal or hilar lymph nodes. Lungs/Pleura: No pneumothorax. No pleural effusion. No lung masses. Irregular 1.6 cm medial right middle  lobe pulmonary nodular opacity (series 5/image 95) is new since 09/15/2016 CT. Right upper lobe solid 5 mm pulmonary nodule (series 5/image 56), for which no comparison exists. No additional significant pulmonary nodules. Mild patchy subpleural reticulation in the anterior lingula is probably from prior radiation therapy. Musculoskeletal: No aggressive appearing focal osseous lesions. Mild thoracic spondylosis. Stable post lumpectomy changes in the outer left breast. CT ABDOMEN PELVIS FINDINGS Hepatobiliary: Normal liver with no liver mass. Normal gallbladder with no radiopaque cholelithiasis. No biliary ductal dilatation. Pancreas: Normal, with no mass or duct dilation. Spleen: Normal size. No mass. Adrenals/Urinary Tract: Stable appearance of the adrenal glands without discrete adrenal nodules. No hydronephrosis. 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  Simple 2.3 cm upper left renal cyst. Subcentimeter hypodense renal cortical lesion in the lower right kidney is too small to characterize and is stable. No new renal lesions. Normal bladder. Stomach/Bowel: Small hiatal hernia. Otherwise normal nondistended stomach. Normal caliber small bowel with no small bowel wall thickening. Normal appendix. Normal large bowel with no diverticulosis, large bowel wall thickening or pericolonic fat stranding. Vascular/Lymphatic: Atherosclerotic nonaneurysmal abdominal aorta. Patent portal, splenic, hepatic and renal veins. No pathologically enlarged lymph nodes in the abdomen or pelvis. Reproductive: Status post hysterectomy, with no abnormal findings at the vaginal cuff. No adnexal mass. Other: No pneumoperitoneum, ascites or focal fluid collection. Musculoskeletal: Large lytic expansile medial left iliac crest osseous lesion with significant soft tissue component, measuring 10.2 x 5.9 cm (series 2/image 86), new since 2017 CT. No additional focal osseous lesions. Mild lumbar spondylosis. IMPRESSION: 1. Large lytic expansile  medial left iliac crest osseous metastasis, new since 2017 CT. 2. New irregular nodular opacity in the medial right middle lobe, new since 2017 CT. Separate subcentimeter right upper lobe solid pulmonary nodule, for which no comparison exists. These findings are indeterminate for pulmonary metastases and attention on follow-up chest CT in 3 months is recommended. 3. No lymphadenopathy or additional findings of metastatic disease. No tumor recurrence at the hysterectomy margin. 4. Aortic Atherosclerosis (ICD10-I70.0). Additional chronic findings as detailed. Electronically Signed   By: Ilona Sorrel M.D.   On: 08/29/2018 11:59   Mr Lumbar Spine Wo Contrast  Result Date: 08/26/2018 CLINICAL DATA:  Acute left-sided low back pain with left-sided sciatica. History of breast cancer and endometrial cancer. EXAM: MRI LUMBAR SPINE WITHOUT CONTRAST TECHNIQUE: Multiplanar, multisequence MR imaging of the lumbar spine was performed. No intravenous contrast was administered. COMPARISON:  None. FINDINGS: Segmentation:  Normal Alignment:  Normal Vertebrae: Fatty bone marrow throughout L5 and the sacrum compatible with prior pelvic radiation for endometrial cancer. Large destructive mass in the left iliac bone with large soft tissue mass extending into the pelvis. This is incompletely visualized and accurate measurements are not possible. No other bony mass lesion in the lumbar spine. Conus medullaris and cauda equina: Conus extends to the L1-2 level. Conus and cauda equina appear normal. Paraspinal and other soft tissues: Negative for retroperitoneal adenopathy. Left upper pole simple cyst Disc levels: T12-L1: Small extruded disc fragment on the left extending caudally medial to the pedicle. This could affect the T12 nerve root but there is no definite nerve root compression or stenosis L1-2: Mild disc degeneration and disc bulging without stenosis L2-3: Small left foraminal disc protrusion which is touching the left L2 nerve  root. Mild facet degeneration. Normal spinal canal diameter L3-4: Mild disc degeneration with diffuse bulging of the disc. Small foraminal disc protrusions bilaterally. Mild facet degeneration. L4-5: Disc bulging and mild facet degeneration without significant stenosis L5-S1: Mild disc and mild facet degeneration. IMPRESSION: Large destructive mass lesion left iliac bone with large soft tissue mass extending into the posterior soft tissues and pelvis compatible with metastatic disease. Consider follow-up CT chest abdomen pelvis with contrast for further staging. Radiation changes at L5 and the sacrum. No lumbar metastatic deposits Multilevel degenerative changes above. These results will be called to the ordering clinician or representative by the Radiologist Assistant, and communication documented in the PACS or zVision Dashboard. Electronically Signed   By: Franchot Gallo M.D.   On: 08/26/2018 08:37   Ct Abdomen Pelvis W Contrast  Result Date: 08/29/2018 CLINICAL DATA:  Endometrial cancer status post [facility] 10/13/2016 with  chemotherapy and radiation therapy. Remote history of left breast cancer. Restaging. Large left iliac bone mass on recent lumbar spine MRI performed for pain. EXAM: CT CHEST, ABDOMEN, AND PELVIS WITH CONTRAST TECHNIQUE: Multidetector CT imaging of the chest, abdomen and pelvis was performed following the standard protocol during bolus administration of intravenous contrast. CONTRAST:  166m OMNIPAQUE IOHEXOL 300 MG/ML  SOLN COMPARISON:  08/25/2018 lumbar spine MRI. 09/15/2016 CT abdomen/pelvis. FINDINGS: CT CHEST FINDINGS Cardiovascular: Normal heart size. No significant pericardial effusion/thickening. Left main coronary atherosclerosis. Atherosclerotic nonaneurysmal thoracic aorta. Normal caliber pulmonary arteries. No central pulmonary emboli. Mediastinum/Nodes: Hypodense 2.4 cm left thyroid lobe nodule. Unremarkable esophagus. No pathologically enlarged axillary, mediastinal or hilar  lymph nodes. Lungs/Pleura: No pneumothorax. No pleural effusion. No lung masses. Irregular 1.6 cm medial right middle lobe pulmonary nodular opacity (series 5/image 95) is new since 09/15/2016 CT. Right upper lobe solid 5 mm pulmonary nodule (series 5/image 56), for which no comparison exists. No additional significant pulmonary nodules. Mild patchy subpleural reticulation in the anterior lingula is probably from prior radiation therapy. Musculoskeletal: No aggressive appearing focal osseous lesions. Mild thoracic spondylosis. Stable post lumpectomy changes in the outer left breast. CT ABDOMEN PELVIS FINDINGS Hepatobiliary: Normal liver with no liver mass. Normal gallbladder with no radiopaque cholelithiasis. No biliary ductal dilatation. Pancreas: Normal, with no mass or duct dilation. Spleen: Normal size. No mass. Adrenals/Urinary Tract: Stable appearance of the adrenal glands without discrete adrenal nodules. No hydronephrosis. 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 Simple 2.3 cm upper left renal cyst. Subcentimeter hypodense renal cortical lesion in the lower right kidney is too small to characterize and is stable. No new renal lesions. Normal bladder. Stomach/Bowel: Small hiatal hernia. Otherwise normal nondistended stomach. Normal caliber small bowel with no small bowel wall thickening. Normal appendix. Normal large bowel with no diverticulosis, large bowel wall thickening or pericolonic fat stranding. Vascular/Lymphatic: Atherosclerotic nonaneurysmal abdominal aorta. Patent portal, splenic, hepatic and renal veins. No pathologically enlarged lymph nodes in the abdomen or pelvis. Reproductive: Status post hysterectomy, with no abnormal findings at the vaginal cuff. No adnexal mass. Other: No pneumoperitoneum, ascites or focal fluid collection. Musculoskeletal: Large lytic expansile medial left iliac crest osseous lesion with significant soft tissue component, measuring 10.2 x 5.9 cm (series 2/image 86),  new since 2017 CT. No additional focal osseous lesions. Mild lumbar spondylosis. IMPRESSION: 1. 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 2. New irregular nodular opacity in the medial right middle lobe, new since 2017 CT. Separate subcentimeter right upper lobe solid pulmonary nodule, for which no comparison exists. These findings are indeterminate for pulmonary metastases and attention on follow-up chest CT in 3 months is recommended. 3. No lymphadenopathy or additional findings of metastatic disease. No tumor recurrence at the hysterectomy margin. 4. Aortic Atherosclerosis (ICD10-I70.0). Additional chronic findings as detailed. Electronically Signed   By: JIlona SorrelM.D.   On: 08/29/2018 11:59    Impression:  Probable recurrent endometrial cancer versus recurrent breast cancer.   Patient will proceed with biopsy of large pelvic mass on 09/06/2018 to determine the type of malignancy involved and to help guide systemic therapy.   Pt will be a good candidate for a short course of palliative directed at the left pelvic mass. We discussed the general course of treatments, side effects, and potential toxicities with the patient. We also discussed that there is a potential risk of overlap for her previous radiation therapy for her endometrial cancer, increasing her risk for  complications, but given the intensity of her pain, she is willing to accept this risk.    Plan:  CT Simulation planning appointment on Monday, 09/05/2018 at 1 PM. Anticipate 10-14 radiation treatments, starting after biopsy complete.  ____________________________________   Blair Promise, PhD, MD    This document serves as a record of services personally performed by [name], MD. It was created on his behalf by [facility], a trained medical scribe. The creation of this record is based on the scribe's personal observations and the provider's statements to them. This document has  been checked and approved by the attending provider.

## 2018-09-02 ENCOUNTER — Telehealth: Payer: Self-pay | Admitting: Oncology

## 2018-09-02 NOTE — Telephone Encounter (Signed)
Tammy Hensley called and asked to verify the instructions for her CT Biopsy on Tuesday.  Reviewed NPO after midnight, arrival at 6 am, do not take any blood thinners.  She asked if she could take pain medication.  Called Tiffany in IR and she said patient can take pain medication with sips of water before procedure.  Called Luisa back and let her know.  She verbalized understanding and agreement.

## 2018-09-05 ENCOUNTER — Other Ambulatory Visit: Payer: Self-pay | Admitting: Student

## 2018-09-05 ENCOUNTER — Ambulatory Visit
Admission: RE | Admit: 2018-09-05 | Discharge: 2018-09-05 | Disposition: A | Discharge status: Home / Self Care | Payer: Medicare Other | Source: Ambulatory Visit | Attending: Radiation Oncology | Admitting: Radiation Oncology

## 2018-09-05 DIAGNOSIS — C55 Malignant neoplasm of uterus, part unspecified: Secondary | ICD-10-CM | POA: Insufficient documentation

## 2018-09-05 DIAGNOSIS — C7951 Secondary malignant neoplasm of bone: Secondary | ICD-10-CM | POA: Insufficient documentation

## 2018-09-05 DIAGNOSIS — Z510 Encounter for antineoplastic radiation therapy: Secondary | ICD-10-CM | POA: Insufficient documentation

## 2018-09-05 NOTE — Progress Notes (Signed)
Returned pt call regarding medications prior to procedure on tomorrow, Tuesday, 09/06/2018. Pt verbalized understanding of instructions. No further questions. Pt appreciative of return call.

## 2018-09-05 NOTE — Progress Notes (Signed)
  Radiation Oncology         [phone] ________________________________  Name: Tammy Hensley MRN: [id number]  Date: 09/05/2018  DOB: 11-30-1940  SIMULATION AND TREATMENT PLANNING NOTE    ICD-10-CM   1. 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  Identity was confirmed.  All relevant records and images related to the planned course of therapy were reviewed.  The patient freely provided informed written consent to proceed with treatment after reviewing the details related to the planned course of therapy. The consent form was witnessed and verified by the simulation staff.  Then, the patient was set-up in a stable reproducible  supine position for radiation therapy.  CT images were obtained.  Surface markings were placed.  The CT images were loaded into the planning software.  Then the target and avoidance structures were contoured.  Treatment planning then occurred.  The radiation prescription was entered and confirmed.  Then, I designed and supervised the construction of a total of 2 medically necessary complex treatment devices.  I have requested : Intensity Modulated Radiotherapy (IMRT) is medically necessary for this case for the following reason:  Previous treatment to this area..  I have ordered:dose calc.  PLAN:  The patient will receive 35 Gy in 14 fractions.  -----------------------------------  Blair Promise, PhD, MD  This document serves as a record of services personally performed by [name], MD. It was created on his behalf by Wilburn Mylar, a trained medical scribe. The creation of this record is based on the scribe's personal observations and the provider's statements to them. This document has been  checked and approved by the attending provider.

## 2018-09-06 ENCOUNTER — Other Ambulatory Visit: Payer: Self-pay

## 2018-09-06 ENCOUNTER — Ambulatory Visit (HOSPITAL_COMMUNITY)
Admission: RE | Admit: 2018-09-06 | Discharge: 2018-09-06 | Disposition: A | Discharge status: Home / Self Care | Payer: Medicare Other | Source: Ambulatory Visit | Attending: Hematology and Oncology | Admitting: Hematology and Oncology

## 2018-09-06 DIAGNOSIS — E119 Type 2 diabetes mellitus without complications: Secondary | ICD-10-CM | POA: Diagnosis not present

## 2018-09-06 DIAGNOSIS — Z7984 Long term (current) use of oral hypoglycemic drugs: Secondary | ICD-10-CM | POA: Diagnosis not present

## 2018-09-06 DIAGNOSIS — Z170 Estrogen receptor positive status [ER+]: Secondary | ICD-10-CM | POA: Diagnosis not present

## 2018-09-06 DIAGNOSIS — E559 Vitamin D deficiency, unspecified: Secondary | ICD-10-CM | POA: Diagnosis not present

## 2018-09-06 DIAGNOSIS — C541 Malignant neoplasm of endometrium: Secondary | ICD-10-CM | POA: Insufficient documentation

## 2018-09-06 DIAGNOSIS — Z79890 Hormone replacement therapy (postmenopausal): Secondary | ICD-10-CM | POA: Insufficient documentation

## 2018-09-06 DIAGNOSIS — E785 Hyperlipidemia, unspecified: Secondary | ICD-10-CM | POA: Diagnosis not present

## 2018-09-06 DIAGNOSIS — C50412 Malignant neoplasm of upper-outer quadrant of left female breast: Secondary | ICD-10-CM

## 2018-09-06 DIAGNOSIS — Z923 Personal history of irradiation: Secondary | ICD-10-CM | POA: Diagnosis not present

## 2018-09-06 DIAGNOSIS — Z510 Encounter for antineoplastic radiation therapy: Secondary | ICD-10-CM | POA: Diagnosis not present

## 2018-09-06 DIAGNOSIS — Z881 Allergy status to other antibiotic agents status: Secondary | ICD-10-CM | POA: Diagnosis not present

## 2018-09-06 DIAGNOSIS — M899 Disorder of bone, unspecified: Secondary | ICD-10-CM | POA: Diagnosis present

## 2018-09-06 DIAGNOSIS — Z8249 Family history of ischemic heart disease and other diseases of the circulatory system: Secondary | ICD-10-CM | POA: Diagnosis not present

## 2018-09-06 DIAGNOSIS — Z79899 Other long term (current) drug therapy: Secondary | ICD-10-CM | POA: Diagnosis not present

## 2018-09-06 DIAGNOSIS — F329 Major depressive disorder, single episode, unspecified: Secondary | ICD-10-CM | POA: Insufficient documentation

## 2018-09-06 DIAGNOSIS — C7989 Secondary malignant neoplasm of other specified sites: Secondary | ICD-10-CM | POA: Diagnosis not present

## 2018-09-06 DIAGNOSIS — I10 Essential (primary) hypertension: Secondary | ICD-10-CM | POA: Insufficient documentation

## 2018-09-06 DIAGNOSIS — Z886 Allergy status to analgesic agent status: Secondary | ICD-10-CM | POA: Diagnosis not present

## 2018-09-06 DIAGNOSIS — Z7982 Long term (current) use of aspirin: Secondary | ICD-10-CM | POA: Diagnosis not present

## 2018-09-06 LAB — CBC
HEMATOCRIT: 37.8 % (ref 36.0–46.0)
HEMOGLOBIN: 12.2 g/dL (ref 12.0–15.0)
MCH: 25.2 pg — ABNORMAL LOW (ref 26.0–34.0)
MCHC: 32.3 g/dL (ref 30.0–36.0)
MCV: 77.9 fL — AB (ref 80.0–100.0)
Platelets: 463 10*3/uL — ABNORMAL HIGH (ref 150–400)
RBC: 4.85 MIL/uL (ref 3.87–5.11)
RDW: 14.9 % (ref 11.5–15.5)
WBC: 8 10*3/uL (ref 4.0–10.5)
nRBC: 0.2 % (ref 0.0–0.2)

## 2018-09-06 LAB — GLUCOSE, CAPILLARY: Glucose-Capillary: 101 mg/dL — ABNORMAL HIGH (ref 70–99)

## 2018-09-06 LAB — APTT: aPTT: 23 seconds — ABNORMAL LOW (ref 24–36)

## 2018-09-06 LAB — PROTIME-INR
INR: 0.99
Prothrombin Time: 13 seconds (ref 11.4–15.2)

## 2018-09-06 MED ORDER — SODIUM CHLORIDE 0.9 % IV SOLN: INTRAVENOUS | Status: DC

## 2018-09-06 MED ORDER — FENTANYL CITRATE (PF) 100 MCG/2ML IJ SOLN
INTRAMUSCULAR | Status: AC | PRN
  Administered 2018-09-06: 50 ug via INTRAVENOUS

## 2018-09-06 MED ORDER — TRAMADOL HCL 50 MG PO TABS
50.0000 mg | ORAL_TABLET | Freq: Once | ORAL | Status: AC
  Administered 2018-09-06: 50 mg via ORAL

## 2018-09-06 MED ORDER — SODIUM CHLORIDE 0.9 % IV SOLN
INTRAVENOUS | Status: AC | PRN
  Administered 2018-09-06: 10 mL/h via INTRAVENOUS

## 2018-09-06 MED ORDER — MIDAZOLAM HCL 2 MG/2ML IJ SOLN
INTRAMUSCULAR | Status: AC | PRN
  Administered 2018-09-06: 1 mg via INTRAVENOUS

## 2018-09-06 MED ORDER — GABAPENTIN 300 MG PO CAPS: 300.0000 mg | ORAL_CAPSULE | Freq: Once | ORAL | Status: DC

## 2018-09-06 MED ORDER — FENTANYL CITRATE (PF) 100 MCG/2ML IJ SOLN
INTRAMUSCULAR | Status: AC
  Filled 2018-09-06: qty 2

## 2018-09-06 MED ORDER — TRAMADOL HCL 50 MG PO TABS: 50.0000 mg | ORAL_TABLET | Freq: Once | ORAL | Status: DC

## 2018-09-06 MED ORDER — LIDOCAINE HCL 1 % IJ SOLN
INTRAMUSCULAR | Status: AC
  Filled 2018-09-06: qty 20

## 2018-09-06 MED ORDER — GABAPENTIN 300 MG PO CAPS
300.0000 mg | ORAL_CAPSULE | Freq: Once | ORAL | Status: AC
  Administered 2018-09-06: 300 mg via ORAL

## 2018-09-06 MED ORDER — MIDAZOLAM HCL 2 MG/2ML IJ SOLN
INTRAMUSCULAR | Status: AC
  Filled 2018-09-06: qty 2

## 2018-09-06 NOTE — Procedures (Signed)
Interventional Radiology Procedure Note  Procedure: CT guided biopsy of left iliac mass  Complications: None  Estimated Blood Loss: < 10 mL  Findings: 18 G core biopsy x 4 via 17 G needle of large, destructive mass of left iliac bone/iliac fossa.  Venetia Night. Kathlene Cote, M.D Pager:  [phone]

## 2018-09-06 NOTE — Consult Note (Signed)
Chief Complaint: Patient was seen in consultation today for pelvic mass  Referring Physician(s): [place]  Supervising Physician: Aletta Edouard  Patient Status: [facility] - Out-pt  History of Present Illness: Tammy Hensley is a 77 y.o. female with past medical history of left breast cancer, DM, depression, HTN, HLD who presents with recent finding of pelvic mass.  She has been evaluated by Oncology who questions recurrent breast cancer vs. New endometrial cancer.  IR consulted for biopsy of mass at the request of Dr. Alvy Bimler.  She has been approved for procedure by Dr. Earleen Newport.   She presents to radiology department today in her usual state of health.  She has been NPO.  She does not take blood thinners.   Past Medical History:  Diagnosis Date  . Avascular necrosis of bones of both hips ([place]) 11/17/2016  . Breast cancer ([place]) 2006   left breast  . Depression   . Diabetes ([place])    borderline  . Family history of cancer   . History of brachytherapy 03/29/17-04/12/17   vaginal cuff 18 Gy in 3 fractions  . History of radiation therapy 02/10/17-03/16/17   pelvis 45 Gy in 25 fractions  . Hyperlipidemia   . Hypertension   . Iron deficiency anemia   . Vitamin D deficiency     Past Surgical History:  Procedure Laterality Date  . BREAST LUMPECTOMY Left 2006  . BREAST SURGERY  2006   left breast lumpectomy- radiation, took Femara   . 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 ROBOTIC ASSISTED LAP VAGINAL HYSTERECTOMY N/A 10/13/2016   Procedure: XI ROBOTIC ASSISTED LAPAROSCOPIC VAGINAL HYSTERECTOMY;  Surgeon: Nancy Marus, MD;  Location: [facility] ORS;  Service: Gynecology;  Laterality: N/A;  . ROBOTIC ASSISTED SALPINGO OOPHERECTOMY Bilateral 10/13/2016   Procedure: XI ROBOTIC ASSISTED SALPINGO OOPHORECTOMY;  Surgeon: Nancy Marus, MD;  Location: [facility] ORS;  Service: Gynecology;  Laterality: Bilateral;  . SENTINEL  NODE BIOPSY N/A 10/13/2016   Procedure: SENTINEL NODE BIOPSY;  Surgeon: Nancy Marus, MD;  Location: [facility] ORS;  Service: Gynecology;  Laterality: N/A;    Allergies: Calan [verapamil]; Effexor [venlafaxine]; Erythromycin; Femara [letrozole]; Fosamax [alendronate]; Ibuprofen; Paxil [paroxetine hcl]; Remeron [mirtazapine]; Tamoxifen; and Vasotec [enalapril]  Medications: Prior to Admission medications   Medication Sig Start Date End Date Taking? Authorizing Provider  acetaminophen (TYLENOL) 500 MG tablet Take 500 mg by mouth daily as needed for moderate pain or headache.   Yes [provider]  aspirin 81 MG tablet Take 81 mg by mouth daily.     Yes [provider]  Calcium Carbonate-Vitamin D (CALCIUM 600 + D PO) Take 1 tablet by mouth 2 (two) times daily.    Yes [provider]  Cholecalciferol (VITAMIN D) 2000 units CAPS Take 2,000 Units by mouth every other day.   Yes [provider]  dexamethasone (DECADRON) 4 MG tablet Take 1 tablet (4 mg total) by mouth 2 (two) times daily. 08/30/18  Yes Gorsuch, Ni, MD  gabapentin (NEURONTIN) 300 MG capsule Take 1 capsule 3 x /day for Sciatica pain Patient taking differently: Take 300 mg by mouth 3 (three) times daily.  08/24/18  Yes [name], MD  levothyroxine (SYNTHROID, LEVOTHROID) 88 MCG tablet Take 1 tablet (88 mcg total) by mouth daily before breakfast. Patient taking differently: Take 44 mcg by mouth 4 (four) times a week. Take 0.5 tablet (44 mcg) by mouth on Mondays, Wednesdays, Fridays, &  Saturdays in the morning on an empty stomach. 05/04/17  Yes [name], MD  loratadine (CLARITIN) 10 MG tablet Take 10 mg by mouth daily as needed for allergies.   Yes [provider]  loratadine-pseudoephedrine (CLARITIN-D 24 HOUR) 10-240 MG 24 hr tablet Take 1 tablet by mouth daily as needed for allergies.   Yes [provider]  metFORMIN (GLUCOPHAGE-XR) 500 MG 24 hr tablet TAKE ONE TABLET BY MOUTH  WITH BREAKFAST AND LUNCH AND TAKE TWO TABLETS BY MOUTH WITH SUPPER FOR DAIBETES. Patient taking differently: Take 500-1,000 mg by mouth See admin instructions. Take 1 tablet (500 mg) by mouth daily before breakfast, 1 tablet (500 mg) by mouth daily before lunch, & take 2 tablets (1000 mg) by mouth daily before supper. 03/16/18  Yes [name], MD  Multiple Vitamin (MULTIVITAMIN WITH MINERALS) TABS tablet Take 1 tablet by mouth daily.   Yes [provider]  pravastatin (PRAVACHOL) 40 MG tablet Take 1 tablet (40 mg total) by mouth every evening. Patient taking differently: Take 20 mg by mouth every evening.  03/02/18  Yes [name], MD  traMADol (ULTRAM) 50 MG tablet Take 1 tablet (50 mg total) by mouth 3 (three) times daily. Take 1/2 to 1 tablet every 4 hours as needed for severe Back Pain Patient taking differently: Take 25-50 mg by mouth every 4 (four) hours as needed (for pain.). Take 1/2 to 1 tablet every 4 hours as needed for severe Back Pain 08/31/18  Yes Gorsuch, Ni, MD  triamterene-hydrochlorothiazide (MAXZIDE) 75-50 MG tablet TAKE 1/2 TABLET BY MOUTH ONCE DAILY FOR BLOOD PRESSURE AND  FLUID Patient taking differently: Take 0.5 tablets by mouth daily.  03/22/18  Yes Liane Comber, NP  oxyCODONE-acetaminophen (PERCOCET) 7.5-325 MG tablet Take 1/2 to 1 tablet every 4 hours as needed for severe pain Patient taking differently: Take 0.5-1 tablets by mouth every 4 (four) hours as needed (for severe pain.). Take 1/2 to 1 tablet every 4 hours as needed for severe pain 08/26/18   [name], MD  traZODone (DESYREL) 100 MG tablet Take 1 tablet (100 mg total) by mouth at bedtime. Patient not taking: Reported on 08/31/2018 05/31/18   [name], MD     Family History  Problem Relation Age of Onset  . Heart disease Mother   . Heart attack Mother   . Cancer Father   . Heart attack Father   . Prostate cancer Father 24       deceased 76  . Breast cancer Paternal Aunt 24        deceased 34s  . Prostate cancer Paternal Uncle 52       deceased 7  . Cancer Paternal Grandmother        unsure; deceased 80s  . Prostate cancer Brother 65       currently 69  . Prostate cancer Brother 62       currently 58    Social History   Socioeconomic History  . Marital status: Married    Spouse name: Not on file  . Number of children: 2  . Years of education: Not on file  . Highest education level: Not on file  Occupational History  . Not on file  Social Needs  . Financial resource strain: Not on file  . Food insecurity:    Worry: Not on file    Inability: Not on file  . Transportation needs:    Medical: Not on file    Non-medical: Not on file  Tobacco  Use  . Smoking status: Never Smoker  . Smokeless tobacco: Never Used  Substance and Sexual Activity  . Alcohol use: No  . Drug use: No  . Sexual activity: Not on file  Lifestyle  . Physical activity:    Days per week: Not on file    Minutes per session: Not on file  . Stress: Not on file  Relationships  . Social connections:    Talks on phone: Not on file    Gets together: Not on file    Attends religious service: Not on file    Active member of club or organization: Not on file    Attends meetings of clubs or organizations: Not on file    Relationship status: Not on file  Other Topics Concern  . Not on file  Social History Narrative  . Not on file     Review of Systems: A 12 point ROS discussed and pertinent positives are indicated in the HPI above.  All other systems are negative.  Review of Systems  Constitutional: Negative for fatigue and fever.  Respiratory: Negative for cough and shortness of breath.   Cardiovascular: Negative for chest pain.  Gastrointestinal: Negative for abdominal pain.  Genitourinary: Positive for pelvic pain.  Musculoskeletal: Negative for back pain.  Psychiatric/Behavioral: Negative for behavioral problems and confusion.    Vital Signs: BP (!) 142/92   Pulse 78    Temp 98.3 F (36.8 C)   Resp 20   Ht 5\' 2"  (1.575 m)   Wt 139 lb (63 kg)   SpO2 97%   BMI 25.42 kg/m   Physical Exam  Constitutional: She is oriented to person, place, and time. She appears well-developed. No distress.  Neck: Normal range of motion. Neck supple.  Cardiovascular: Normal rate, regular rhythm and normal heart sounds. Exam reveals no gallop and no friction rub.  No murmur heard. Pulmonary/Chest: Effort normal and breath sounds normal. No respiratory distress.  Abdominal: Soft. She exhibits no distension. There is no tenderness.  Neurological: She is alert and oriented to person, place, and time.  Skin: Skin is warm and dry. She is not diaphoretic.  Psychiatric: She has a normal mood and affect. Her behavior is normal. Judgment and thought content normal.  Nursing note and vitals reviewed.    MD Evaluation Airway: WNL Heart: WNL Abdomen: WNL Chest/ Lungs: WNL ASA  Classification: 3 Mallampati/Airway Score: One   Imaging: Ct Chest W Contrast  Result Date: 08/29/2018 CLINICAL DATA:  Endometrial cancer status post [facility] 10/13/2016 with chemotherapy and radiation therapy. Remote history of left breast cancer. Restaging. Large left iliac bone mass on recent lumbar spine MRI performed for pain. EXAM: CT CHEST, ABDOMEN, AND PELVIS WITH CONTRAST TECHNIQUE: Multidetector CT imaging of the chest, abdomen and pelvis was performed following the standard protocol during bolus administration of intravenous contrast. CONTRAST:  195mL OMNIPAQUE IOHEXOL 300 MG/ML  SOLN COMPARISON:  08/25/2018 lumbar spine MRI. 09/15/2016 CT abdomen/pelvis. FINDINGS: CT CHEST FINDINGS Cardiovascular: Normal heart size. No significant pericardial effusion/thickening. Left main coronary atherosclerosis. Atherosclerotic nonaneurysmal thoracic aorta. Normal caliber pulmonary arteries. No central pulmonary emboli. Mediastinum/Nodes: Hypodense 2.4 cm left thyroid lobe nodule. Unremarkable esophagus. No  pathologically enlarged axillary, mediastinal or hilar lymph nodes. Lungs/Pleura: No pneumothorax. No pleural effusion. No lung masses. Irregular 1.6 cm medial right middle lobe pulmonary nodular opacity (series 5/image 95) is new since 09/15/2016 CT. Right upper lobe solid 5 mm pulmonary nodule (series 5/image 56), for which no comparison exists. No additional significant pulmonary  nodules. Mild patchy subpleural reticulation in the anterior lingula is probably from prior radiation therapy. Musculoskeletal: No aggressive appearing focal osseous lesions. Mild thoracic spondylosis. Stable post lumpectomy changes in the outer left breast. CT ABDOMEN PELVIS FINDINGS Hepatobiliary: Normal liver with no liver mass. Normal gallbladder with no radiopaque cholelithiasis. No biliary ductal dilatation. Pancreas: Normal, with no mass or duct dilation. Spleen: Normal size. No mass. Adrenals/Urinary Tract: Stable appearance of the adrenal glands without discrete adrenal nodules. No hydronephrosis. 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 Simple 2.3 cm upper left renal cyst. Subcentimeter hypodense renal cortical lesion in the lower right kidney is too small to characterize and is stable. No new renal lesions. Normal bladder. Stomach/Bowel: Small hiatal hernia. Otherwise normal nondistended stomach. Normal caliber small bowel with no small bowel wall thickening. Normal appendix. Normal large bowel with no diverticulosis, large bowel wall thickening or pericolonic fat stranding. Vascular/Lymphatic: Atherosclerotic nonaneurysmal abdominal aorta. Patent portal, splenic, hepatic and renal veins. No pathologically enlarged lymph nodes in the abdomen or pelvis. Reproductive: Status post hysterectomy, with no abnormal findings at the vaginal cuff. No adnexal mass. Other: No pneumoperitoneum, ascites or focal fluid collection. Musculoskeletal: Large lytic expansile medial left iliac crest osseous lesion with significant soft tissue  component, measuring 10.2 x 5.9 cm (series 2/image 86), new since 2017 CT. No additional focal osseous lesions. Mild lumbar spondylosis. IMPRESSION: 1. 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 2. New irregular nodular opacity in the medial right middle lobe, new since 2017 CT. Separate subcentimeter right upper lobe solid pulmonary nodule, for which no comparison exists. These findings are indeterminate for pulmonary metastases and attention on follow-up chest CT in 3 months is recommended. 3. No lymphadenopathy or additional findings of metastatic disease. No tumor recurrence at the hysterectomy margin. 4. Aortic Atherosclerosis (ICD10-I70.0). Additional chronic findings as detailed. Electronically Signed   By: Ilona Sorrel M.D.   On: 08/29/2018 11:59   Mr Lumbar Spine Wo Contrast  Result Date: 08/26/2018 CLINICAL DATA:  Acute left-sided low back pain with left-sided sciatica. History of breast cancer and endometrial cancer. EXAM: MRI LUMBAR SPINE WITHOUT CONTRAST TECHNIQUE: Multiplanar, multisequence MR imaging of the lumbar spine was performed. No intravenous contrast was administered. COMPARISON:  None. FINDINGS: Segmentation:  Normal Alignment:  Normal Vertebrae: Fatty bone marrow throughout L5 and the sacrum compatible with prior pelvic radiation for endometrial cancer. Large destructive mass in the left iliac bone with large soft tissue mass extending into the pelvis. This is incompletely visualized and accurate measurements are not possible. No other bony mass lesion in the lumbar spine. Conus medullaris and cauda equina: Conus extends to the L1-2 level. Conus and cauda equina appear normal. Paraspinal and other soft tissues: Negative for retroperitoneal adenopathy. Left upper pole simple cyst Disc levels: T12-L1: Small extruded disc fragment on the left extending caudally medial to the pedicle. This could affect the T12 nerve root but there is no definite  nerve root compression or stenosis L1-2: Mild disc degeneration and disc bulging without stenosis L2-3: Small left foraminal disc protrusion which is touching the left L2 nerve root. Mild facet degeneration. Normal spinal canal diameter L3-4: Mild disc degeneration with diffuse bulging of the disc. Small foraminal disc protrusions bilaterally. Mild facet degeneration. L4-5: Disc bulging and mild facet degeneration without significant stenosis L5-S1: Mild disc and mild facet degeneration. IMPRESSION: Large destructive mass lesion left iliac bone with large soft tissue mass extending into the posterior soft tissues and pelvis  compatible with metastatic disease. Consider follow-up CT chest abdomen pelvis with contrast for further staging. Radiation changes at L5 and the sacrum. No lumbar metastatic deposits Multilevel degenerative changes above. These results will be called to the ordering clinician or representative by the Radiologist Assistant, and communication documented in the PACS or zVision Dashboard. Electronically Signed   By: Franchot Gallo M.D.   On: 08/26/2018 08:37   Ct Abdomen Pelvis W Contrast  Result Date: 08/29/2018 CLINICAL DATA:  Endometrial cancer status post [facility] 10/13/2016 with chemotherapy and radiation therapy. Remote history of left breast cancer. Restaging. Large left iliac bone mass on recent lumbar spine MRI performed for pain. EXAM: CT CHEST, ABDOMEN, AND PELVIS WITH CONTRAST TECHNIQUE: Multidetector CT imaging of the chest, abdomen and pelvis was performed following the standard protocol during bolus administration of intravenous contrast. CONTRAST:  127mL OMNIPAQUE IOHEXOL 300 MG/ML  SOLN COMPARISON:  08/25/2018 lumbar spine MRI. 09/15/2016 CT abdomen/pelvis. FINDINGS: CT CHEST FINDINGS Cardiovascular: Normal heart size. No significant pericardial effusion/thickening. Left main coronary atherosclerosis. Atherosclerotic nonaneurysmal thoracic aorta. Normal caliber pulmonary arteries.  No central pulmonary emboli. Mediastinum/Nodes: Hypodense 2.4 cm left thyroid lobe nodule. Unremarkable esophagus. No pathologically enlarged axillary, mediastinal or hilar lymph nodes. Lungs/Pleura: No pneumothorax. No pleural effusion. No lung masses. Irregular 1.6 cm medial right middle lobe pulmonary nodular opacity (series 5/image 95) is new since 09/15/2016 CT. Right upper lobe solid 5 mm pulmonary nodule (series 5/image 56), for which no comparison exists. No additional significant pulmonary nodules. Mild patchy subpleural reticulation in the anterior lingula is probably from prior radiation therapy. Musculoskeletal: No aggressive appearing focal osseous lesions. Mild thoracic spondylosis. Stable post lumpectomy changes in the outer left breast. CT ABDOMEN PELVIS FINDINGS Hepatobiliary: Normal liver with no liver mass. Normal gallbladder with no radiopaque cholelithiasis. No biliary ductal dilatation. Pancreas: Normal, with no mass or duct dilation. Spleen: Normal size. No mass. Adrenals/Urinary Tract: Stable appearance of the adrenal glands without discrete adrenal nodules. No hydronephrosis. 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 Simple 2.3 cm upper left renal cyst. Subcentimeter hypodense renal cortical lesion in the lower right kidney is too small to characterize and is stable. No new renal lesions. Normal bladder. Stomach/Bowel: Small hiatal hernia. Otherwise normal nondistended stomach. Normal caliber small bowel with no small bowel wall thickening. Normal appendix. Normal large bowel with no diverticulosis, large bowel wall thickening or pericolonic fat stranding. Vascular/Lymphatic: Atherosclerotic nonaneurysmal abdominal aorta. Patent portal, splenic, hepatic and renal veins. No pathologically enlarged lymph nodes in the abdomen or pelvis. Reproductive: Status post hysterectomy, with no abnormal findings at the vaginal cuff. No adnexal mass. Other: No pneumoperitoneum, ascites or focal fluid  collection. Musculoskeletal: Large lytic expansile medial left iliac crest osseous lesion with significant soft tissue component, measuring 10.2 x 5.9 cm (series 2/image 86), new since 2017 CT. No additional focal osseous lesions. Mild lumbar spondylosis. IMPRESSION: 1. 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 2. New irregular nodular opacity in the medial right middle lobe, new since 2017 CT. Separate subcentimeter right upper lobe solid pulmonary nodule, for which no comparison exists. These findings are indeterminate for pulmonary metastases and attention on follow-up chest CT in 3 months is recommended. 3. No lymphadenopathy or additional findings of metastatic disease. No tumor recurrence at the hysterectomy margin. 4. Aortic Atherosclerosis (ICD10-I70.0). Additional chronic findings as detailed. Electronically Signed   By: Ilona Sorrel M.D.   On: 08/29/2018 11:59    Labs:  CBC: Recent Labs  05/31/18 0906 07/05/18 1009 08/29/18 0926 09/06/18 0646  WBC 3.5* 4.3 4.3 8.0  HGB 13.0 13.1 13.2 12.2  HCT 40.1 40.2 39.9 37.8  PLT 273 384 290 463*    COAGS: Recent Labs    09/06/18 0646  INR 0.99  APTT 23*    BMP: Recent Labs    03/22/18 1020 05/31/18 0906 07/05/18 1009 08/29/18 0926  NA 143 139 137 137  K 4.6 3.6 3.7 3.9  CL 101 100 96* 99  CO2 30 30 28 25   GLUCOSE 81 89 86 102*  BUN 10 10 9 10   CALCIUM 10.0 10.3 9.9 10.4*  CREATININE 0.65 0.79 0.63 0.69  GFRNONAA 86 >60 87 >60  GFRAA 100 >60 100 >60    LIVER FUNCTION TESTS: Recent Labs    03/22/18 1020 05/31/18 0906 07/05/18 1009 08/29/18 0926  BILITOT 0.6 0.4 0.5 0.5  AST 15 16 13 18   ALT 10 14 9 10   ALKPHOS  --  76  --  76  PROT 6.6 7.5 7.1 7.3  ALBUMIN  --  4.3  --  3.7    TUMOR MARKERS: No results for input(s): AFPTM, CEA, CA199, CHROMGRNA in the last 8760 hours.  Assessment and Plan: Patient with past medical history of breast cancer, DM, HTN, brast cancer  presents with complaint of pelvic mass.  IR consulted for pelvic mass biopsy at the request of Dr. Alvy Bimler. Case reviewed by Dr. Earleen Newport who approves patient for procedure.  Patient presents today in their usual state of health.  She has been NPO and is not currently on blood thinners.    Risks and benefits discussed with the patient including, but not limited to bleeding, infection, damage to adjacent structures or low yield requiring additional tests.  All of the patient's questions were answered, patient is agreeable to proceed. Consent signed and in chart.  Thank you for this interesting consult.  I greatly enjoyed meeting Tammy Hensley and look forward to participating in their care.  A copy of this report was sent to the requesting provider on this date.  Electronically Signed: Docia Barrier, PA 09/06/2018, 8:02 AM   I spent a total of  30 Minutes   in face to face in clinical consultation, greater than 50% of which was counseling/coordinating care for pelvic mass.

## 2018-09-06 NOTE — Discharge Instructions (Signed)
Needle Biopsy, Care After °These instructions give you information about caring for yourself after your procedure. Your doctor may also give you more specific instructions. Call your doctor if you have any problems or questions after your procedure. °Follow these instructions at home: °· Rest as told by your doctor. °· Take medicines only as told by your doctor. °· There are many different ways to close and cover the biopsy site, including stitches (sutures), skin glue, and adhesive strips. Follow instructions from your doctor about: °? How to take care of your biopsy site. °? When and how you should change your bandage (dressing). °? When you should remove your dressing. °? Removing whatever was used to close your biopsy site. °· Check your biopsy site every day for signs of infection. Watch for: °? Redness, swelling, or pain. °? Fluid, blood, or pus. °Contact a doctor if: °· You have a fever. °· You have redness, swelling, or pain at the biopsy site, and it lasts longer than a few days. °· You have fluid, blood, or pus coming from the biopsy site. °· You feel sick to your stomach (nauseous). °· You throw up (vomit). °Get help right away if: °· You are short of breath. °· You have trouble breathing. °· Your chest hurts. °· You feel dizzy or you pass out (faint). °· You have bleeding that does not stop with pressure or a bandage. °· You cough up blood. °· Your belly (abdomen) hurts. °This information is not intended to replace advice given to you by your health care provider. Make sure you discuss any questions you have with your health care provider. °Document Released: 10/22/2008 Document Revised: 04/16/2016 Document Reviewed: 11/05/2014 °[employer] Interactive Patient Education © 2018 [employer] ° °

## 2018-09-08 ENCOUNTER — Telehealth: Payer: Self-pay | Admitting: Hematology and Oncology

## 2018-09-08 ENCOUNTER — Encounter: Payer: Self-pay | Admitting: Hematology and Oncology

## 2018-09-08 ENCOUNTER — Ambulatory Visit
Admission: RE | Admit: 2018-09-08 | Discharge: 2018-09-08 | Disposition: A | Discharge status: Home / Self Care | Payer: Medicare Other | Source: Ambulatory Visit | Attending: Radiation Oncology | Admitting: Radiation Oncology

## 2018-09-08 ENCOUNTER — Inpatient Hospital Stay (HOSPITAL_BASED_OUTPATIENT_CLINIC_OR_DEPARTMENT_OTHER): Payer: Medicare Other | Admitting: Hematology and Oncology

## 2018-09-08 ENCOUNTER — Encounter: Payer: Self-pay | Admitting: Oncology

## 2018-09-08 VITALS — BP 139/68 | HR 75 | Temp 98.4°F | Resp 17 | Ht 62.0 in | Wt 142.0 lb

## 2018-09-08 DIAGNOSIS — R64 Cachexia: Secondary | ICD-10-CM

## 2018-09-08 DIAGNOSIS — Z170 Estrogen receptor positive status [ER+]: Secondary | ICD-10-CM

## 2018-09-08 DIAGNOSIS — C50412 Malignant neoplasm of upper-outer quadrant of left female breast: Secondary | ICD-10-CM

## 2018-09-08 DIAGNOSIS — G893 Neoplasm related pain (acute) (chronic): Secondary | ICD-10-CM

## 2018-09-08 DIAGNOSIS — C7951 Secondary malignant neoplasm of bone: Secondary | ICD-10-CM | POA: Diagnosis not present

## 2018-09-08 DIAGNOSIS — C541 Malignant neoplasm of endometrium: Secondary | ICD-10-CM

## 2018-09-08 DIAGNOSIS — Z510 Encounter for antineoplastic radiation therapy: Secondary | ICD-10-CM | POA: Diagnosis not present

## 2018-09-08 DIAGNOSIS — R5381 Other malaise: Secondary | ICD-10-CM

## 2018-09-08 DIAGNOSIS — M5442 Lumbago with sciatica, left side: Secondary | ICD-10-CM

## 2018-09-08 DIAGNOSIS — Z0000 Encounter for general adult medical examination without abnormal findings: Secondary | ICD-10-CM

## 2018-09-08 MED ORDER — INFLUENZA VAC SPLIT QUAD 0.5 ML IM SUSY
0.5000 mL | PREFILLED_SYRINGE | Freq: Once | INTRAMUSCULAR | Status: AC
  Administered 2018-09-08: 0.5 mL via INTRAMUSCULAR

## 2018-09-08 MED ORDER — INFLUENZA VAC SPLIT QUAD 0.5 ML IM SUSY
PREFILLED_SYRINGE | INTRAMUSCULAR | Status: AC
  Filled 2018-09-08: qty 0.5

## 2018-09-08 MED ORDER — GABAPENTIN 300 MG PO CAPS: 300.0000 mg | ORAL_CAPSULE | Freq: Two times a day (BID) | ORAL | 11 refills | Status: DC

## 2018-09-08 NOTE — Assessment & Plan Note (Signed)
Much improved.  Continue dexamethasone 2 mg twice a day along with scheduled tramadol 3 times a day and gabapentin at nighttime

## 2018-09-08 NOTE — Assessment & Plan Note (Signed)
Biopsies pending.  She will start palliative radiation therapy

## 2018-09-08 NOTE — Assessment & Plan Note (Signed)
This is improved.  She has gained weight.  Continue low-dose dexamethasone She is advised to monitor her blood sugar

## 2018-09-08 NOTE — Assessment & Plan Note (Signed)
Biopsy is pending.  Continue supportive care

## 2018-09-08 NOTE — Progress Notes (Signed)
[place] OFFICE PROGRESS NOTE  Patient Care Team: [name], MD as PCP - General (Internal Medicine) [name], MD as Consulting Physician (Gastroenterology) [name], MD as Consulting Physician (Ophthalmology) Magrinat, Virgie Dad, MD as Consulting Physician (Oncology) Cheri Fowler, MD as Consulting Physician (Obstetrics and Gynecology) [name], MD as Consulting Physician (Hematology and Oncology) [name], MD as Consulting Physician (Radiation Oncology)  ASSESSMENT & PLAN:  Endometrial cancer [facility]) Biopsy is pending.  Continue supportive care  Cancer associated pain Much improved.  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  She has gained weight.  Continue low-dose dexamethasone She is advised to monitor her blood sugar  Metastasis to bone (HCC) Biopsies pending.  She will start palliative radiation therapy  Physical debility This is due to weakness.  I recommend physical therapy and rehab referral and she agreed to proceed.   Orders Placed This Encounter  Procedures  . Ambulatory referral to Physical Therapy    Referral Priority:   Routine    Referral Type:   Physical Medicine    Referral Reason:   Specialty Services Required    Requested Specialty:   Physical Therapy    Number of Visits Requested:   1    INTERVAL HISTORY: Please see below for problem oriented charting. 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 SEE  COMMENT.  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 The greatest extent of invasive carcinoma as measured on the needle core biopsy measures 0.6 cm.       Endometrial cancer ([place])   02/03/2002 Pathology Results    1. BENIGN ENDOMETRIAL POLYPS.  2. UTERINE FIBROIDS: PORTIONS OF SMOOTH MUSCLE CONSISTENT WITH BENIGN LEIOMYOMA(S). PORTIONS OF BENIGN, CYSTICALLY ATROPHIC ENDOMETRIUM WITHOUT HYPERPLASIA OR EVIDENCE OF MALIGNANCY. 3. ENDOMETRIAL CURETTAGE: BENIGN ENDOMETRIUM AND SMOOTH MUSCLE.    09/07/2016 Pathology Results    PAP smear positive for malignant cells    09/07/2016 Initial Diagnosis    Patient had postmenopausal bleeding in 08-2016. She had PAP 09-07-16 by PCP Dr [name], which documented carcinoma. She had evaluation by Dr [name] with colposcopy/ ECC/endometrial biopsy documenting serous endometrioid endometrial carcinoma.    09/15/2016 Imaging    Ct imaging showed markedly thickened and heterogeneous endometrial stripe suspicious for endometrial carcinoma in this patient with postmenopausal vaginal bleeding. Cystic lesion in the right ovary cannot be definitively characterized. Pelvic ultrasound is recommended for further evaluation. Aortoiliac atherosclerosis. Avascular necrosis of the femoral heads bilaterally.    10/07/2016 Tumor Marker    Patient's tumor was tested for the following markers: CA125 Results of the tumor marker test revealed 15.1    10/13/2016 Pathology Results    1. Lymph node, sentinel, biopsy, right obturator - THERE IS NO EVIDENCE OF CARCINOMA IN 8 OF 8 LYMPH NODES (0/8). - SEE COMMENT. 2. Lymph node, sentinel, biopsy, left obturator - THERE IS NO EVIDENCE OF CARCINOMA  IN 1 OF 1 LYMPH NODE (0/1). - SEE COMMENT. 3. Lymph node, sentinel, biopsy, left para-aortic - THERE IS NO EVIDENCE OF CARCINOMA IN 4 OF 4 LYMPH  NODES (0/4). - SEE COMMENT. 4. Uterus +/- tubes/ovaries, neoplastic, cervix - INVASIVE MIXED SEROUS/ENDOMETRIOID ADENOCARCINOMA, MULTIPLE FOCI, FIGO GRADE III, THE LARGEST FOCUS SPANS 8.2 CM. - ADENOCARCINOMA EXTENDS INTO THE OUTER HALF OF THE MYOMETRIUM AND INVOLVES THE STROMA OF THE LOWER UTERINE SEGMENT AND CERVIX. - LYMPHOVASCULAR INVASION IS IDENTIFIED. - THE SURGICAL RESECTION MARGINS ARE NEGATIVE FOR CARCINOMA. - SEE ONCOLOGY TABLE BELOW. ADDITIONAL FINDINGS: - ENDOMETRIUM: ENDOMETRIOID TYPE POLYP(S), WHICH CONTAIN FOCI OF SIMILAR APPEARING MIXED SEROUS/ENDOMETRIOID ADENOCARCINOMA. - MYOMETRIUM: LEIOMYOMATA. - SEROSA: UNREMARKABLE. - BILATERAL ADNEXA: BENIGN OVARIES AND FALLOPIAN TUBES.    10/13/2016 Surgery    Surgery: Total robotic hysterectomy bilateral salpingo-oophorectomy, bilateral pelvic sentinel lymph node removal, left PA sentinel lymph node removal. Mini-laparotomy for specimen removal Surgeons:  [name], MD; [name], MD  Pathology:  1)Uterus, cervix, bilateral tubes and ovaries 2) Right obturator SLN 3) Left Obturator SLN 4) Left PA SLN Operative findings: 14 week fibroid uterus with one large dominant 6 cm myoma that was palpably calcified. 4 cm right ovarian cyst. + SLN identified in the bilateral obturator spaces, + SLN identified in the left PA space.     12/01/2016 - 05/31/2017 Chemotherapy    She received carboplatin & Taxol. She had 3 cycles of chemo followed by radiation and then 3 more cycles of chemo    01/11/2017 Genetic Testing    Testing was normal and did not reveal a mutation in these genes: The genes tested were the 80 genes on [employer]'s Multi-Cancer panel (ALK, APC, ATM, AXIN2, BAP1, BARD1, BLM, BMPR1A, BRCA1, BRCA2, BRIP1, CASR, CDC73, CDH1, CDK4, CDKN1B, CDKN1C, CDKN2A, CEBPA, CHEK2, DICER1, DIS3L2, EGFR, EPCAM, FH, FLCN, GATA2, GPC3, GREM1, HOXB13,  HRAS, KIT, MAX, MEN1, MET, MITF, MLH1, MSH2, MSH6, MUTYH, NBN, NF1, NF2, PALB2,  PDGFRA, PHOX2B, PMS2, POLD1, POLE, POT1, PRKAR1A, PTCH1, PTEN, RAD50, RAD51C, RAD51D, RB1, RECQL4, RET, RUNX1, SDHA, SDHAF2, SDHB, SDHC, SDHD, SMAD4, SMARCA4, SMARCB1, SMARCE1, STK11, SUFU, TERC, TERT, TMEM127, TP53, TSC1, TSC2, VHL, WRN, and WT1).    02/10/2017 - 04/12/2017 Radiation Therapy    02/10/17-03/16/17; 03/29/17-04/12/17;  1) Pelvis/ 45 Gy in 25 fractions  2) Vaginal Cuff/ 18 Gy in 3 fractions    08/26/2018 Imaging    Large destructive mass lesion left iliac bone with large soft tissue mass extending into the posterior soft tissues and pelvis compatible with metastatic disease. Consider follow-up CT chest abdomen pelvis with contrast for further staging.  Radiation changes at L5 and the sacrum. No lumbar metastatic deposits  Multilevel degenerative changes above.    08/29/2018 Imaging    1. 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 2. New irregular nodular opacity in the medial right middle lobe, new since 2017 CT. Separate subcentimeter right upper lobe solid pulmonary nodule, for which no comparison exists. These findings are indeterminate for pulmonary metastases and attention on follow-up chest CT in 3 months is recommended. 3. No lymphadenopathy or additional findings of metastatic disease.No tumor recurrence at the hysterectomy margin. 4. Aortic Atherosclerosis (ICD10-I70.0). Additional chronic findings as detailed.    08/29/2018 Tumor Marker    Patient's tumor was tested for the following markers: CA-125 Results of the tumor marker test revealed 17.4    09/06/2018 Procedure    CT-guided core biopsy performed of huge mass in the left iliac fossa  destroying the left iliac bone.     REVIEW OF SYSTEMS:   Constitutional: Denies fevers, chills or abnormal weight loss Eyes: Denies blurriness of vision Ears, nose, mouth, throat, and face: Denies mucositis or sore throat Respiratory: Denies cough, dyspnea or wheezes Cardiovascular:  Denies palpitation, chest discomfort or lower extremity swelling Skin: Denies abnormal skin rashes Lymphatics: Denies new lymphadenopathy or easy bruising Behavioral/Psych: Mood is stable, no new changes  All other systems were reviewed with the patient and are negative.  I have reviewed the past medical history, past surgical history, social history and family history with the patient and they are unchanged from previous note.  ALLERGIES:  is allergic to calan [verapamil]; effexor [venlafaxine]; erythromycin; femara [letrozole]; fosamax [alendronate]; ibuprofen; paxil [paroxetine hcl]; remeron [mirtazapine]; tamoxifen; and vasotec [enalapril].  MEDICATIONS:  Current Outpatient Medications  Medication Sig Dispense Refill  . acetaminophen (TYLENOL) 500 MG tablet Take 500 mg by mouth daily as needed for moderate pain or headache.    Marland Kitchen aspirin 81 MG tablet Take 81 mg by mouth daily.      . Calcium Carbonate-Vitamin D (CALCIUM 600 + D PO) Take 1 tablet by mouth 2 (two) times daily.     . Cholecalciferol (VITAMIN D) 2000 units CAPS Take 2,000 Units by mouth every other day.    Marland Kitchen dexamethasone (DECADRON) 4 MG tablet Take 1 tablet (4 mg total) by mouth 2 (two) times daily. 60 tablet 0  . gabapentin (NEURONTIN) 300 MG capsule Take 1 capsule (300 mg total) by mouth 2 (two) times daily. 60 capsule 11  . levothyroxine (SYNTHROID, LEVOTHROID) 88 MCG tablet Take 1 tablet (88 mcg total) by mouth daily before breakfast. (Patient taking differently: Take 44 mcg by mouth 4 (four) times a week. Take 0.5 tablet (44 mcg) by mouth on Mondays, Wednesdays, Fridays, & Saturdays in the morning on an empty stomach.) 90 tablet 1  . loratadine (CLARITIN) 10 MG tablet Take 10 mg by mouth daily as needed for allergies.    Marland Kitchen loratadine-pseudoephedrine (CLARITIN-D 24 HOUR) 10-240 MG 24 hr tablet Take 1 tablet by mouth daily as needed for allergies.    . metFORMIN (GLUCOPHAGE-XR) 500 MG 24 hr tablet TAKE ONE TABLET BY MOUTH  WITH BREAKFAST AND LUNCH AND TAKE TWO TABLETS BY MOUTH WITH SUPPER FOR DAIBETES. (Patient taking differently: Take 500-1,000 mg by mouth See admin instructions. Take 1 tablet (500 mg) by mouth daily before breakfast, 1 tablet (500 mg) by mouth daily before lunch, & take 2 tablets (1000 mg) by mouth daily before supper.) 360 tablet 1  . Multiple Vitamin (MULTIVITAMIN WITH MINERALS) TABS tablet Take 1 tablet by mouth daily.    . pravastatin (PRAVACHOL) 40 MG tablet Take 1 tablet (40 mg total) by mouth every evening. (Patient taking differently: Take 20 mg by mouth every evening. ) 90 tablet 1  . traMADol (ULTRAM) 50 MG tablet Take 1 tablet (50 mg total) by mouth 3 (three) times daily. Take 1/2 to 1 tablet every 4 hours as needed for severe Back Pain (Patient taking differently: Take 25-50 mg by mouth every 4 (four) hours as needed (for pain.). Take 1/2 to 1 tablet every 4 hours as needed for severe Back Pain) 90 tablet 0   No current facility-administered medications for this visit.     PHYSICAL EXAMINATION: ECOG PERFORMANCE STATUS: 2 - Symptomatic, <50% confined to bed  Vitals:   09/08/18 1325  BP: 139/68  Pulse: 75  Resp: 17  Temp: 98.4 F (36.9 C)  SpO2: 100%   Filed Weights   09/08/18 1325  Weight: 142 lb (64.4 kg)    GENERAL:alert, no distress and comfortable SKIN: skin color, texture, turgor are normal, no rashes or significant lesions Musculoskeletal:no cyanosis of digits and no clubbing  NEURO: alert & oriented x 3 with fluent speech, no focal motor/sensory deficits  LABORATORY DATA:  I have reviewed the data as listed    Component Value Date/Time   NA 137 08/29/2018 0926   NA 137 05/31/2017 0931   K 3.9 08/29/2018 0926   K 3.5 05/31/2017 0931   CL 99 08/29/2018 0926   CL 99 12/20/2012 1336   CO2 25 08/29/2018 0926   CO2 25 05/31/2017 0931   GLUCOSE 102 (H) 08/29/2018 0926   GLUCOSE 161 (H) 05/31/2017 0931   GLUCOSE 110 (H) 12/20/2012 1336   BUN 10 08/29/2018 0926    BUN 10.9 05/31/2017 0931   CREATININE 0.69 08/29/2018 0926   CREATININE 0.63 07/05/2018 1009   CREATININE 0.8 05/31/2017 0931   CALCIUM 10.4 (H) 08/29/2018 0926   CALCIUM 10.6 (H) 05/31/2017 0931   PROT 7.3 08/29/2018 0926   PROT 7.5 05/31/2017 0931   ALBUMIN 3.7 08/29/2018 0926   ALBUMIN 4.2 05/31/2017 0931   AST 18 08/29/2018 0926   AST 16 05/31/2017 0931   ALT 10 08/29/2018 0926   ALT 15 05/31/2017 0931   ALKPHOS 76 08/29/2018 0926   ALKPHOS 91 05/31/2017 0931   BILITOT 0.5 08/29/2018 0926   BILITOT 0.33 05/31/2017 0931   GFRNONAA >60 08/29/2018 0926   GFRNONAA 87 07/05/2018 1009   GFRAA >60 08/29/2018 0926   GFRAA 100 07/05/2018 1009    No results found for: SPEP, UPEP  Lab Results  Component Value Date   WBC 8.0 09/06/2018   NEUTROABS 3.1 08/29/2018   HGB 12.2 09/06/2018   HCT 37.8 09/06/2018   MCV 77.9 (L) 09/06/2018   PLT 463 (H) 09/06/2018      Chemistry      Component Value Date/Time   NA 137 08/29/2018 0926   NA 137 05/31/2017 0931   K 3.9 08/29/2018 0926   K 3.5 05/31/2017 0931   CL 99 08/29/2018 0926   CL 99 12/20/2012 1336   CO2 25 08/29/2018 0926   CO2 25 05/31/2017 0931   BUN 10 08/29/2018 0926   BUN 10.9 05/31/2017 0931   CREATININE 0.69 08/29/2018 0926   CREATININE 0.63 07/05/2018 1009   CREATININE 0.8 05/31/2017 0931      Component Value Date/Time   CALCIUM 10.4 (H) 08/29/2018 0926   CALCIUM 10.6 (H) 05/31/2017 0931   ALKPHOS 76 08/29/2018 0926   ALKPHOS 91 05/31/2017 0931   AST 18 08/29/2018 0926   AST 16 05/31/2017 0931   ALT 10 08/29/2018 0926   ALT 15 05/31/2017 0931   BILITOT 0.5 08/29/2018 0926   BILITOT 0.33 05/31/2017 0931       RADIOGRAPHIC STUDIES: I have personally reviewed the radiological images as listed and agreed with the findings in the report. Ct Chest W Contrast  Result Date: 08/29/2018 CLINICAL DATA:  Endometrial cancer status post [facility] 10/13/2016 with chemotherapy and radiation therapy. Remote history  of left breast cancer. Restaging. Large left iliac bone mass on recent lumbar spine MRI performed for pain. EXAM: CT CHEST, ABDOMEN, AND PELVIS WITH CONTRAST TECHNIQUE: Multidetector CT imaging of the chest, abdomen and pelvis was performed following the standard protocol during bolus administration of intravenous contrast. CONTRAST:  162m OMNIPAQUE IOHEXOL 300 MG/ML  SOLN COMPARISON:  08/25/2018 lumbar spine MRI. 09/15/2016 CT abdomen/pelvis. FINDINGS: CT CHEST FINDINGS Cardiovascular: Normal heart size. No significant pericardial effusion/thickening. Left main coronary atherosclerosis. Atherosclerotic nonaneurysmal thoracic aorta. Normal caliber pulmonary arteries. No central pulmonary emboli. Mediastinum/Nodes: Hypodense 2.4 cm left thyroid lobe nodule. Unremarkable esophagus. No pathologically enlarged axillary, mediastinal or hilar lymph nodes. Lungs/Pleura: No pneumothorax. No pleural effusion. No lung masses. Irregular 1.6 cm medial right middle lobe pulmonary nodular opacity (series 5/image 95) is new since 09/15/2016 CT. Right upper lobe solid 5 mm pulmonary nodule (series 5/image 56), for which no comparison exists. No additional significant pulmonary nodules. Mild patchy subpleural reticulation in the anterior lingula is probably from prior radiation therapy. Musculoskeletal: No aggressive appearing focal osseous lesions. Mild thoracic spondylosis. Stable post lumpectomy changes in the outer left breast. CT ABDOMEN PELVIS FINDINGS Hepatobiliary: Normal liver with no liver mass. Normal gallbladder with no radiopaque cholelithiasis. No biliary ductal dilatation. Pancreas: Normal, with no mass or duct dilation. Spleen: Normal size. No mass. Adrenals/Urinary Tract: Stable appearance of the adrenal glands without discrete adrenal nodules. No hydronephrosis. 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 Simple 2.3 cm upper left renal cyst. Subcentimeter hypodense renal cortical lesion in the lower right kidney  is too small to characterize and is stable. No new renal lesions. Normal bladder. Stomach/Bowel: Small hiatal hernia. Otherwise normal nondistended stomach. Normal caliber small bowel with no small bowel wall thickening. Normal appendix. Normal large bowel with no diverticulosis, large bowel wall thickening or pericolonic fat stranding. Vascular/Lymphatic: Atherosclerotic nonaneurysmal abdominal aorta. Patent portal, splenic, hepatic and renal veins. No pathologically enlarged lymph nodes in the abdomen or pelvis. Reproductive: Status post hysterectomy, with no abnormal findings at the vaginal cuff. No adnexal mass. Other: No pneumoperitoneum, ascites or focal fluid collection. Musculoskeletal: Large lytic expansile medial left iliac crest osseous lesion with significant soft tissue component, measuring 10.2 x 5.9 cm (series 2/image 86), new since 2017 CT. No additional focal osseous lesions. Mild lumbar spondylosis. IMPRESSION: 1. 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 2. New irregular nodular opacity in the medial right middle lobe, new since 2017 CT. Separate subcentimeter right upper lobe solid pulmonary nodule, for which no comparison exists. These findings are indeterminate for pulmonary metastases and attention on follow-up chest CT in 3 months is recommended. 3. No lymphadenopathy or additional findings of metastatic disease. No tumor recurrence at the hysterectomy margin. 4. Aortic Atherosclerosis (ICD10-I70.0). Additional chronic findings as detailed. Electronically Signed   By: Ilona Sorrel M.D.   On: 08/29/2018 11:59   Mr Lumbar Spine Wo Contrast  Result Date: 08/26/2018 CLINICAL DATA:  Acute left-sided low back pain with left-sided sciatica. History of breast cancer and endometrial cancer. EXAM: MRI LUMBAR SPINE WITHOUT CONTRAST TECHNIQUE: Multiplanar, multisequence MR imaging of the lumbar spine was performed. No intravenous contrast was administered.  COMPARISON:  None. FINDINGS: Segmentation:  Normal Alignment:  Normal Vertebrae: Fatty bone marrow throughout L5 and the sacrum compatible with prior pelvic radiation for endometrial cancer. Large destructive mass in the left iliac bone with large soft tissue mass extending into the pelvis. This is incompletely visualized and accurate measurements are not possible. No other bony mass lesion in the lumbar spine. Conus medullaris and cauda equina: Conus extends to the L1-2 level. Conus and cauda equina appear normal. Paraspinal and other soft tissues: Negative for retroperitoneal adenopathy. Left upper pole simple cyst Disc levels: T12-L1: Small extruded disc fragment on the left extending caudally medial to the  pedicle. This could affect the T12 nerve root but there is no definite nerve root compression or stenosis L1-2: Mild disc degeneration and disc bulging without stenosis L2-3: Small left foraminal disc protrusion which is touching the left L2 nerve root. Mild facet degeneration. Normal spinal canal diameter L3-4: Mild disc degeneration with diffuse bulging of the disc. Small foraminal disc protrusions bilaterally. Mild facet degeneration. L4-5: Disc bulging and mild facet degeneration without significant stenosis L5-S1: Mild disc and mild facet degeneration. IMPRESSION: Large destructive mass lesion left iliac bone with large soft tissue mass extending into the posterior soft tissues and pelvis compatible with metastatic disease. Consider follow-up CT chest abdomen pelvis with contrast for further staging. Radiation changes at L5 and the sacrum. No lumbar metastatic deposits Multilevel degenerative changes above. These results will be called to the ordering clinician or representative by the Radiologist Assistant, and communication documented in the PACS or zVision Dashboard. Electronically Signed   By: Franchot Gallo M.D.   On: 08/26/2018 08:37   Ct Abdomen Pelvis W Contrast  Result Date:  08/29/2018 CLINICAL DATA:  Endometrial cancer status post [facility] 10/13/2016 with chemotherapy and radiation therapy. Remote history of left breast cancer. Restaging. Large left iliac bone mass on recent lumbar spine MRI performed for pain. EXAM: CT CHEST, ABDOMEN, AND PELVIS WITH CONTRAST TECHNIQUE: Multidetector CT imaging of the chest, abdomen and pelvis was performed following the standard protocol during bolus administration of intravenous contrast. CONTRAST:  197m OMNIPAQUE IOHEXOL 300 MG/ML  SOLN COMPARISON:  08/25/2018 lumbar spine MRI. 09/15/2016 CT abdomen/pelvis. FINDINGS: CT CHEST FINDINGS Cardiovascular: Normal heart size. No significant pericardial effusion/thickening. Left main coronary atherosclerosis. Atherosclerotic nonaneurysmal thoracic aorta. Normal caliber pulmonary arteries. No central pulmonary emboli. Mediastinum/Nodes: Hypodense 2.4 cm left thyroid lobe nodule. Unremarkable esophagus. No pathologically enlarged axillary, mediastinal or hilar lymph nodes. Lungs/Pleura: No pneumothorax. No pleural effusion. No lung masses. Irregular 1.6 cm medial right middle lobe pulmonary nodular opacity (series 5/image 95) is new since 09/15/2016 CT. Right upper lobe solid 5 mm pulmonary nodule (series 5/image 56), for which no comparison exists. No additional significant pulmonary nodules. Mild patchy subpleural reticulation in the anterior lingula is probably from prior radiation therapy. Musculoskeletal: No aggressive appearing focal osseous lesions. Mild thoracic spondylosis. Stable post lumpectomy changes in the outer left breast. CT ABDOMEN PELVIS FINDINGS Hepatobiliary: Normal liver with no liver mass. Normal gallbladder with no radiopaque cholelithiasis. No biliary ductal dilatation. Pancreas: Normal, with no mass or duct dilation. Spleen: Normal size. No mass. Adrenals/Urinary Tract: Stable appearance of the adrenal glands without discrete adrenal nodules. No hydronephrosis. Stable mild  malrotation of the right kidney. Simple 2.3 cm upper left renal cyst. Subcentimeter hypodense renal cortical lesion in the lower right kidney is too small to characterize and is stable. No new renal lesions. Normal bladder. Stomach/Bowel: Small hiatal hernia. Otherwise normal nondistended stomach. Normal caliber small bowel with no small bowel wall thickening. Normal appendix. Normal large bowel with no diverticulosis, large bowel wall thickening or pericolonic fat stranding. Vascular/Lymphatic: Atherosclerotic nonaneurysmal abdominal aorta. Patent portal, splenic, hepatic and renal veins. No pathologically enlarged lymph nodes in the abdomen or pelvis. Reproductive: Status post hysterectomy, with no abnormal findings at the vaginal cuff. No adnexal mass. Other: No pneumoperitoneum, ascites or focal fluid collection. Musculoskeletal: Large lytic expansile medial left iliac crest osseous lesion with significant soft tissue component, measuring 10.2 x 5.9 cm (series 2/image 86), new since 2017 CT. No additional focal osseous lesions. Mild lumbar spondylosis. IMPRESSION: 1. Large lytic expansile  medial left iliac crest osseous metastasis, new since 2017 CT. 2. New irregular nodular opacity in the medial right middle lobe, new since 2017 CT. Separate subcentimeter right upper lobe solid pulmonary nodule, for which no comparison exists. These findings are indeterminate for pulmonary metastases and attention on follow-up chest CT in 3 months is recommended. 3. No lymphadenopathy or additional findings of metastatic disease. No tumor recurrence at the hysterectomy margin. 4. Aortic Atherosclerosis (ICD10-I70.0). Additional chronic findings as detailed. Electronically Signed   By: Ilona Sorrel M.D.   On: 08/29/2018 11:59   Ct Biopsy  Result Date: 09/06/2018 CLINICAL DATA:  Remote history of breast carcinoma and more recent history of endometrial carcinoma. Presenting with large destructive soft tissue mass of the left  iliac fossa causing bony destruction of the left iliac bone. EXAM: CT GUIDED CORE BIOPSY OF LEFT ILIAC BONE MASS ANESTHESIA/SEDATION: 1.0 mg IV Versed; 50 mcg IV Fentanyl Total Moderate Sedation Time:  15 minutes. The patient's level of consciousness and physiologic status were continuously monitored during the procedure by Radiology nursing. PROCEDURE: The procedure risks, benefits, and alternatives were explained to the patient. Questions regarding the procedure were encouraged and answered. The patient understands and consents to the procedure. A time-out was performed prior to initiating the procedure. The patient was placed in a prone position and initial CT the pelvis performed in a prone position. The left gluteal region was prepped with chlorhexidine in a sterile fashion, and a sterile drape was applied covering the operative field. A sterile gown and sterile gloves were used for the procedure. Local anesthesia was provided with 1% Lidocaine. Under CT guidance, a 17 gauge trocar needle was advanced into a soft tissue mass in the left iliac fossa centered in the left iliac bone. Four separate 18 gauge core biopsy samples were obtained and submitted in formalin. COMPLICATIONS: None FINDINGS: Huge destructive soft tissue mass of the left iliac fossa measures approximately 11.3 x 7.8 cm in greatest transverse dimensions and is causing destruction of the left iliac crest and superior left iliac bone, extending into the superior aspect of the left SI joint. Soft tissue mass also extends into the superior aspect of the left gluteal musculature. Solid tissue was obtained from the mass. IMPRESSION: CT-guided core biopsy performed of huge mass in the left iliac fossa destroying the left iliac bone. Electronically Signed   By: Aletta Edouard M.D.   On: 09/06/2018 11:03    All questions were answered. The patient knows to call the clinic with any problems, questions or concerns. No barriers to learning was  detected.  I spent 25 minutes counseling the patient face to face. The total time spent in the appointment was 30 minutes and more than 50% was on counseling and review of test results  [name], MD 09/08/2018 4:03 PM

## 2018-09-08 NOTE — Assessment & Plan Note (Signed)
This is due to weakness.  I recommend physical therapy and rehab referral and she agreed to proceed.

## 2018-09-08 NOTE — Telephone Encounter (Signed)
Gave patient avs and calendar.   °

## 2018-09-09 ENCOUNTER — Telehealth: Payer: Self-pay

## 2018-09-09 ENCOUNTER — Ambulatory Visit
Admission: RE | Admit: 2018-09-09 | Discharge: 2018-09-09 | Disposition: A | Discharge status: Home / Self Care | Payer: Medicare Other | Source: Ambulatory Visit | Attending: Radiation Oncology | Admitting: Radiation Oncology

## 2018-09-09 DIAGNOSIS — Z510 Encounter for antineoplastic radiation therapy: Secondary | ICD-10-CM | POA: Diagnosis not present

## 2018-09-09 NOTE — Telephone Encounter (Signed)
Patient called stating that her pharmacy will not fill her prescription for gabapentin Dr Alvy Bimler gave her yesterday because last had it filled on Oct 2nd for 90 pills therefore she should have enough to last through Nov 1st.  Pt states she only has 2 pills left.  Pt confirms that the pill bottle says 90 pils dispensed on Oct 2. I advised pt to call pharmacy and see how much it would cost her to pay out of pocket for enough pills to get her through to Nov 1.  Also advised pt that per the most recent prescription of Tramadol  She can increase to q 4hrs if needed.  Pt verbalizes understanding of instructions.

## 2018-09-12 ENCOUNTER — Ambulatory Visit
Admission: RE | Admit: 2018-09-12 | Discharge: 2018-09-12 | Disposition: A | Discharge status: Home / Self Care | Payer: Medicare Other | Source: Ambulatory Visit | Attending: Radiation Oncology | Admitting: Radiation Oncology

## 2018-09-12 DIAGNOSIS — Z510 Encounter for antineoplastic radiation therapy: Secondary | ICD-10-CM | POA: Diagnosis not present

## 2018-09-13 ENCOUNTER — Ambulatory Visit
Admission: RE | Admit: 2018-09-13 | Discharge: 2018-09-13 | Disposition: A | Discharge status: Home / Self Care | Payer: Medicare Other | Source: Ambulatory Visit | Attending: Radiation Oncology | Admitting: Radiation Oncology

## 2018-09-13 DIAGNOSIS — Z510 Encounter for antineoplastic radiation therapy: Secondary | ICD-10-CM | POA: Diagnosis not present

## 2018-09-13 NOTE — Progress Notes (Signed)
  Radiation Oncology         ([phone]) [phone] ________________________________  Name: Tammy Hensley MRN: [id number]  Date: 09/08/2018  DOB: 1941-02-26  Simulation Verification Note    ICD-10-CM   1. 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 The clinical setup was verified. Then port films were obtained and uploaded to the radiation oncology medical record software.  The treatment beams were carefully compared against the planned radiation fields. The position location and shape of the radiation fields was reviewed. They targeted volume of tissue appears to be appropriately covered by the radiation beams. Organs at risk appear to be excluded as planned.  Based on my personal review, I approved the simulation verification. The patient's treatment will proceed as planned.  -----------------------------------  Blair Promise, PhD, MD

## 2018-09-14 ENCOUNTER — Ambulatory Visit
Admission: RE | Admit: 2018-09-14 | Discharge: 2018-09-14 | Disposition: A | Discharge status: Home / Self Care | Payer: Medicare Other | Source: Ambulatory Visit | Attending: Radiation Oncology | Admitting: Radiation Oncology

## 2018-09-14 ENCOUNTER — Telehealth: Payer: Self-pay | Admitting: Oncology

## 2018-09-14 DIAGNOSIS — C7951 Secondary malignant neoplasm of bone: Secondary | ICD-10-CM

## 2018-09-14 DIAGNOSIS — Z510 Encounter for antineoplastic radiation therapy: Secondary | ICD-10-CM | POA: Diagnosis not present

## 2018-09-14 NOTE — Telephone Encounter (Signed)
Called Tammy Hensley and notified her that the biopsy of her left iliac fossa was suggestive of endometrial cancer per Dr. Alvy Bimler. Advised her that Dr. Alvy Bimler will discuss treatment at her appointment on Thursday.  She verbalized understanding and agreement.

## 2018-09-15 ENCOUNTER — Inpatient Hospital Stay (HOSPITAL_BASED_OUTPATIENT_CLINIC_OR_DEPARTMENT_OTHER): Payer: Medicare Other | Admitting: Hematology and Oncology

## 2018-09-15 ENCOUNTER — Ambulatory Visit
Admission: RE | Admit: 2018-09-15 | Discharge: 2018-09-15 | Disposition: A | Discharge status: Home / Self Care | Payer: Medicare Other | Source: Ambulatory Visit | Attending: Radiation Oncology | Admitting: Radiation Oncology

## 2018-09-15 ENCOUNTER — Telehealth: Payer: Self-pay | Admitting: Oncology

## 2018-09-15 ENCOUNTER — Telehealth: Payer: Self-pay | Admitting: Hematology and Oncology

## 2018-09-15 VITALS — BP 136/57 | HR 74 | Temp 98.1°F | Resp 18 | Ht 62.0 in | Wt 144.2 lb

## 2018-09-15 DIAGNOSIS — R5381 Other malaise: Secondary | ICD-10-CM

## 2018-09-15 DIAGNOSIS — R64 Cachexia: Secondary | ICD-10-CM

## 2018-09-15 DIAGNOSIS — Z510 Encounter for antineoplastic radiation therapy: Secondary | ICD-10-CM | POA: Diagnosis not present

## 2018-09-15 DIAGNOSIS — C7951 Secondary malignant neoplasm of bone: Secondary | ICD-10-CM | POA: Diagnosis not present

## 2018-09-15 DIAGNOSIS — C541 Malignant neoplasm of endometrium: Secondary | ICD-10-CM

## 2018-09-15 DIAGNOSIS — G893 Neoplasm related pain (acute) (chronic): Secondary | ICD-10-CM

## 2018-09-15 DIAGNOSIS — Z7189 Other specified counseling: Secondary | ICD-10-CM

## 2018-09-15 MED ORDER — LIDOCAINE-PRILOCAINE 2.5-2.5 % EX CREA: TOPICAL_CREAM | CUTANEOUS | 3 refills | Status: DC

## 2018-09-15 MED ORDER — ONDANSETRON HCL 8 MG PO TABS: 8.0000 mg | ORAL_TABLET | Freq: Three times a day (TID) | ORAL | 1 refills | Status: DC | PRN

## 2018-09-15 MED ORDER — PROCHLORPERAZINE MALEATE 10 MG PO TABS: 10.0000 mg | ORAL_TABLET | Freq: Four times a day (QID) | ORAL | 1 refills | Status: DC | PRN

## 2018-09-15 MED ORDER — DEXAMETHASONE 4 MG PO TABS: ORAL_TABLET | ORAL | 0 refills | Status: DC

## 2018-09-15 NOTE — Telephone Encounter (Signed)
Gave avs and calendar ° °

## 2018-09-15 NOTE — Telephone Encounter (Signed)
Requested Her2/neu, ER/PR and PDL-1 testing on Accession: [id number] with Suanne Marker in [facility] Pathology.

## 2018-09-16 ENCOUNTER — Ambulatory Visit: Payer: Medicare Other | Attending: Hematology and Oncology | Admitting: Physical Therapy

## 2018-09-16 ENCOUNTER — Encounter: Payer: Self-pay | Admitting: Physical Therapy

## 2018-09-16 ENCOUNTER — Ambulatory Visit
Admission: RE | Admit: 2018-09-16 | Discharge: 2018-09-16 | Disposition: A | Discharge status: Home / Self Care | Payer: Medicare Other | Source: Ambulatory Visit | Attending: Radiation Oncology | Admitting: Radiation Oncology

## 2018-09-16 ENCOUNTER — Other Ambulatory Visit: Payer: Self-pay

## 2018-09-16 ENCOUNTER — Encounter: Payer: Self-pay | Admitting: Hematology and Oncology

## 2018-09-16 DIAGNOSIS — Z7189 Other specified counseling: Secondary | ICD-10-CM | POA: Insufficient documentation

## 2018-09-16 DIAGNOSIS — M6281 Muscle weakness (generalized): Secondary | ICD-10-CM | POA: Insufficient documentation

## 2018-09-16 DIAGNOSIS — R262 Difficulty in walking, not elsewhere classified: Secondary | ICD-10-CM | POA: Diagnosis present

## 2018-09-16 DIAGNOSIS — Z510 Encounter for antineoplastic radiation therapy: Secondary | ICD-10-CM | POA: Diagnosis not present

## 2018-09-16 NOTE — Progress Notes (Signed)
[place] OFFICE PROGRESS NOTE  Patient Care Team: [name], MD as PCP - General (Internal Medicine) [name], MD as Consulting Physician (Gastroenterology) [name], MD as Consulting Physician (Ophthalmology) Magrinat, Virgie Dad, MD as Consulting Physician (Oncology) [name], [name], MD as Consulting Physician (Obstetrics and Gynecology) [name], MD as Consulting Physician (Hematology and Oncology) [name], MD as Consulting Physician (Radiation Oncology)  ASSESSMENT & PLAN:  Endometrial cancer [facility]) I have reviewed pathology report with the patient Recent biopsy suggested recurrent endometrial cancer I will get pathology department to add additional molecular study She tolerated prior treatment well I recommend port placement and treatment with combination carboplatin and Taxol Due to her recent radiation treatment, I plan to reduce a dose of chemotherapy a little bit due to anticipated increased risk of pancytopenia and infection  We reviewed the NCCN guidelines We discussed the role of chemotherapy. The intent is of palliative intent.  We discussed some of the risks, benefits, side-effects of carboplatin & Taxol. Treatment is intravenous, every 3 weeks x 6 cycles  Some of the short term side-effects included, though not limited to, including weight loss, life threatening infections, risk of allergic reactions, need for transfusions of blood products, nausea, vomiting, change in bowel habits, loss of hair, admission to hospital for various reasons, and risks of death.   Long term side-effects are also discussed including risks of infertility, permanent damage to nerve function, hearing loss, chronic fatigue, kidney damage with possibility needing hemodialysis, and rare secondary malignancy including bone marrow disorders.  The patient is aware that the response rates discussed earlier is not guaranteed.  After a long discussion, patient  made an informed decision to proceed with the prescribed plan of care.   Patient education material was dispensed. We discussed premedication with dexamethasone before chemotherapy. I would not recommend prophylactic G-CSF support for now I recommend minimum 3 cycles of treatment before repeat imaging study I recommend waiting at least a week after radiation therapy is completed before starting her on chemotherapy   Cancer associated pain Much improved.  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  She has gained weight.  Continue low-dose dexamethasone She is advised to monitor her blood sugar  Metastasis to bone (HCC) Overall improving.  She will continue radiation therapy  Physical debility I recommend physical therapy and rehab  Goals of care, counseling/discussion The patient is aware she has incurable disease and treatment is strictly palliative. We discussed importance of Advanced Directives and Living will.   Orders Placed This Encounter  Procedures  . IR IMAGING GUIDED PORT INSERTION    Standing Status:   Future    Standing Expiration Date:   11/16/2019    Order Specific Question:   Reason for Exam (SYMPTOM  OR DIAGNOSIS REQUIRED)    Answer:   need port for chemo tot start 11/11    Order Specific Question:   Preferred Imaging Location?    Answer:   [facility]  . CBC with Differential ([place] Only)    Standing Status:   Standing    Number of Occurrences:   20    Standing Expiration Date:   09/16/2019  . CMP ([place] only)    Standing Status:   Standing    Number of Occurrences:   20  Standing Expiration Date:   09/16/2019  . Magnesium    Standing Status:   Standing    Number of Occurrences:   3    Standing Expiration Date:   09/17/2019  . Uric acid     Standing Status:   Standing    Number of Occurrences:   2    Standing Expiration Date:   09/17/2019  . CA 125    Standing Status:   Standing    Number of Occurrences:   11    Standing Expiration Date:   09/17/2019    INTERVAL HISTORY: Please see below for problem oriented charting. 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 SEE COMMENT.  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 The greatest extent of invasive carcinoma as measured on the needle core biopsy measures 0.6 cm.       Endometrial cancer ([place])   02/03/2002 Pathology Results    1. BENIGN ENDOMETRIAL POLYPS.  2. UTERINE FIBROIDS: PORTIONS OF SMOOTH MUSCLE CONSISTENT WITH BENIGN LEIOMYOMA(S). PORTIONS OF BENIGN, CYSTICALLY ATROPHIC ENDOMETRIUM WITHOUT HYPERPLASIA OR EVIDENCE OF MALIGNANCY. 3. ENDOMETRIAL CURETTAGE: BENIGN ENDOMETRIUM AND SMOOTH MUSCLE.    09/07/2016 Pathology Results    PAP smear positive for malignant cells    09/07/2016 Initial Diagnosis    Patient had postmenopausal bleeding in 08-2016. She had PAP 09-07-16 by PCP Dr [name], which documented carcinoma. She had evaluation by Dr [name] with colposcopy/ ECC/endometrial biopsy documenting serous endometrioid endometrial carcinoma.    09/15/2016 Imaging    Ct imaging showed markedly thickened and heterogeneous endometrial stripe suspicious for endometrial carcinoma in this patient with postmenopausal  vaginal bleeding. Cystic lesion in the right ovary cannot be definitively characterized. Pelvic ultrasound is recommended for further evaluation. Aortoiliac atherosclerosis. Avascular necrosis of the femoral heads bilaterally.    10/07/2016 Tumor Marker    Patient's tumor was tested for the following markers: CA125 Results of the tumor marker test revealed 15.1    10/13/2016 Pathology Results    1. Lymph node, sentinel, biopsy, right obturator - THERE IS NO EVIDENCE OF CARCINOMA IN 8 OF 8 LYMPH NODES (0/8). - SEE COMMENT. 2. Lymph node, sentinel, biopsy, left obturator - THERE IS NO EVIDENCE OF CARCINOMA IN 1 OF 1 LYMPH NODE (0/1). - SEE COMMENT. 3. Lymph node, sentinel, biopsy, left para-aortic - THERE IS NO EVIDENCE OF CARCINOMA IN 4 OF 4 LYMPH NODES (0/4). - SEE COMMENT. 4. Uterus +/- tubes/ovaries, neoplastic, cervix - INVASIVE MIXED SEROUS/ENDOMETRIOID ADENOCARCINOMA, MULTIPLE FOCI, FIGO GRADE III, THE LARGEST FOCUS SPANS 8.2 CM. - ADENOCARCINOMA EXTENDS INTO THE OUTER HALF OF THE MYOMETRIUM AND INVOLVES THE STROMA OF THE LOWER UTERINE SEGMENT AND CERVIX. - LYMPHOVASCULAR INVASION IS IDENTIFIED. - THE SURGICAL RESECTION MARGINS ARE NEGATIVE FOR CARCINOMA. - SEE ONCOLOGY TABLE BELOW. ADDITIONAL FINDINGS: - ENDOMETRIUM: ENDOMETRIOID TYPE POLYP(S), WHICH CONTAIN FOCI OF SIMILAR APPEARING MIXED SEROUS/ENDOMETRIOID ADENOCARCINOMA. - MYOMETRIUM: LEIOMYOMATA. - SEROSA: UNREMARKABLE. - BILATERAL ADNEXA: BENIGN OVARIES AND FALLOPIAN TUBES.    10/13/2016 Surgery  Surgery: Total robotic hysterectomy bilateral salpingo-oophorectomy, bilateral pelvic sentinel lymph node removal, left PA sentinel lymph node removal. Mini-laparotomy for specimen removal Surgeons:  [name], MD; [name], MD  Pathology:  1)Uterus, cervix, bilateral tubes and ovaries 2) Right obturator SLN 3) Left Obturator SLN 4) Left PA SLN Operative findings: 14 week fibroid uterus with one large  dominant 6 cm myoma that was palpably calcified. 4 cm right ovarian cyst. + SLN identified in the bilateral obturator spaces, + SLN identified in the left PA space.     12/01/2016 - 05/31/2017 Chemotherapy    She received carboplatin & Taxol. She had 3 cycles of chemo followed by radiation and then 3 more cycles of chemo    01/11/2017 Genetic Testing    Testing was normal and did not reveal a mutation in these genes: The genes tested were the 80 genes on [employer]'s Multi-Cancer panel (ALK, APC, ATM, AXIN2, BAP1, BARD1, BLM, BMPR1A, BRCA1, BRCA2, BRIP1, CASR, CDC73, CDH1, CDK4, CDKN1B, CDKN1C, CDKN2A, CEBPA, CHEK2, DICER1, DIS3L2, EGFR, EPCAM, FH, FLCN, GATA2, GPC3, GREM1, HOXB13,  HRAS, KIT, MAX, MEN1, MET, MITF, MLH1, MSH2, MSH6, MUTYH, NBN, NF1, NF2, PALB2, PDGFRA, PHOX2B, PMS2, POLD1, POLE, POT1, PRKAR1A, PTCH1, PTEN, RAD50, RAD51C, RAD51D, RB1, RECQL4, RET, RUNX1, SDHA, SDHAF2, SDHB, SDHC, SDHD, SMAD4, SMARCA4, SMARCB1, SMARCE1, STK11, SUFU, TERC, TERT, TMEM127, TP53, TSC1, TSC2, VHL, WRN, and WT1).    02/10/2017 - 04/12/2017 Radiation Therapy    02/10/17-03/16/17; 03/29/17-04/12/17;  1) Pelvis/ 45 Gy in 25 fractions  2) Vaginal Cuff/ 18 Gy in 3 fractions    08/26/2018 Imaging    Large destructive mass lesion left iliac bone with large soft tissue mass extending into the posterior soft tissues and pelvis compatible with metastatic disease. Consider follow-up CT chest abdomen pelvis with contrast for further staging.  Radiation changes at L5 and the sacrum. No lumbar metastatic deposits  Multilevel degenerative changes above.    08/29/2018 Imaging    1. 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 2. New irregular nodular opacity in the medial right middle lobe, new since 2017 CT. Separate subcentimeter right upper lobe solid pulmonary nodule, for which no comparison exists. These findings are indeterminate for pulmonary metastases and attention on follow-up  chest CT in 3 months is recommended. 3. No lymphadenopathy or additional findings of metastatic disease.No tumor recurrence at the hysterectomy margin. 4. Aortic Atherosclerosis (ICD10-I70.0). Additional chronic findings as detailed.    08/29/2018 Tumor Marker    Patient's tumor was tested for the following markers: CA-125 Results of the tumor marker test revealed 17.4    09/06/2018 Procedure    CT-guided core biopsy performed of huge mass in the left iliac fossa destroying the left iliac bone.    09/06/2018 Pathology Results    Soft Tissue Needle Core Biopsy, Left Iliac Fossa - POORLY DIFFERENTIATED MALIGNANT NEOPLASM. Microscopic Comment The provided clinical history of both endometrial and breast cancer is noted. In the current case, immunohistochemistry for qualitative ER demonstrates scattered positive staining. CK8/18 is focally positive. CKAE1/3, CK7, CK20, TTF-1, CDX-2, GATA3 and PAX 8 are negative. The immunophenotype is not specific as to an origin for this poorly differentiated malignant neoplasm. The morphology is more suggestive of endometrial cancer than breast cancer, and may represent an unrecognized carcinosarcomatous component.     Metastasis to bone ([place])   08/30/2018 Initial Diagnosis    Metastasis to bone ([place])    09/15/2018 -  Chemotherapy    The patient had  palonosetron (ALOXI) injection 0.25 mg, 0.25 mg, Intravenous,  Once, 0 of 4 cycles CARBOplatin (PARAPLATIN) 360 mg in sodium chloride 0.9 % 100 mL chemo infusion, 360 mg (100 % of original dose 364.5 mg), Intravenous,  Once, 0 of 4 cycles Dose modification:   (original dose 364.5 mg, Cycle 1) PACLitaxel (TAXOL) 222 mg in sodium chloride 0.9 % 250 mL chemo infusion (> '80mg'$ /m2), 131.25 mg/m2 = 222 mg (75 % of original dose 175 mg/m2), Intravenous,  Once, 0 of 4 cycles Dose modification: 131.25 mg/m2 (75 % of original dose 175 mg/m2, Cycle 1, Reason: Dose Not Tolerated)  for chemotherapy treatment.      REVIEW OF  SYSTEMS:   Constitutional: Denies fevers, chills or abnormal weight loss Eyes: Denies blurriness of vision Ears, nose, mouth, throat, and face: Denies mucositis or sore throat Respiratory: Denies cough, dyspnea or wheezes Cardiovascular: Denies palpitation, chest discomfort or lower extremity swelling Gastrointestinal:  Denies nausea, heartburn or change in bowel habits Skin: Denies abnormal skin rashes Lymphatics: Denies new lymphadenopathy or easy bruising Neurological:Denies numbness, tingling or new weaknesses Behavioral/Psych: Mood is stable, no new changes  All other systems were reviewed with the patient and are negative.  I have reviewed the past medical history, past surgical history, social history and family history with the patient and they are unchanged from previous note.  ALLERGIES:  is allergic to calan [verapamil]; effexor [venlafaxine]; erythromycin; femara [letrozole]; fosamax [alendronate]; ibuprofen; paxil [paroxetine hcl]; remeron [mirtazapine]; tamoxifen; and vasotec [enalapril].  MEDICATIONS:  Current Outpatient Medications  Medication Sig Dispense Refill  . acetaminophen (TYLENOL) 500 MG tablet Take 500 mg by mouth daily as needed for moderate pain or headache.    Marland Kitchen aspirin 81 MG tablet Take 81 mg by mouth daily.      . Calcium Carbonate-Vitamin D (CALCIUM 600 + D PO) Take 1 tablet by mouth 2 (two) times daily.     . Cholecalciferol (VITAMIN D) 2000 units CAPS Take 2,000 Units by mouth every other day.    Marland Kitchen dexamethasone (DECADRON) 4 MG tablet Take 3 tabs at the night before and 3 tab the morning of chemotherapy, every 3 weeks, by mouth 36 tablet 0  . gabapentin (NEURONTIN) 300 MG capsule Take 1 capsule (300 mg total) by mouth 2 (two) times daily. 60 capsule 11  . levothyroxine (SYNTHROID, LEVOTHROID) 88 MCG tablet Take 1 tablet (88 mcg total) by mouth daily before breakfast. (Patient taking differently: Take 44 mcg by mouth 4 (four) times a week. Take 0.5 tablet  (44 mcg) by mouth on Mondays, Wednesdays, Fridays, & Saturdays in the morning on an empty stomach.) 90 tablet 1  . lidocaine-prilocaine (EMLA) cream Apply to affected area once 30 g 3  . loratadine (CLARITIN) 10 MG tablet Take 10 mg by mouth daily as needed for allergies.    Marland Kitchen loratadine-pseudoephedrine (CLARITIN-D 24 HOUR) 10-240 MG 24 hr tablet Take 1 tablet by mouth daily as needed for allergies.    . metFORMIN (GLUCOPHAGE-XR) 500 MG 24 hr tablet TAKE ONE TABLET BY MOUTH WITH BREAKFAST AND LUNCH AND TAKE TWO TABLETS BY MOUTH WITH SUPPER FOR DAIBETES. (Patient taking differently: Take 500-1,000 mg by mouth See admin instructions. Take 1 tablet (500 mg) by mouth daily before breakfast, 1 tablet (500 mg) by mouth daily before lunch, & take 2 tablets (1000 mg) by mouth daily before supper.) 360 tablet 1  . Multiple Vitamin (MULTIVITAMIN WITH MINERALS) TABS tablet Take 1 tablet by mouth daily.    . ondansetron (ZOFRAN)  8 MG tablet Take 1 tablet (8 mg total) by mouth every 8 (eight) hours as needed for refractory nausea / vomiting. Start on day 3 after chemo. 30 tablet 1  . pravastatin (PRAVACHOL) 40 MG tablet Take 1 tablet (40 mg total) by mouth every evening. (Patient taking differently: Take 20 mg by mouth every evening. ) 90 tablet 1  . prochlorperazine (COMPAZINE) 10 MG tablet Take 1 tablet (10 mg total) by mouth every 6 (six) hours as needed (Nausea or vomiting). 30 tablet 1  . traMADol (ULTRAM) 50 MG tablet Take 1 tablet (50 mg total) by mouth 3 (three) times daily. Take 1/2 to 1 tablet every 4 hours as needed for severe Back Pain (Patient taking differently: Take 25-50 mg by mouth every 4 (four) hours as needed (for pain.). Take 1/2 to 1 tablet every 4 hours as needed for severe Back Pain) 90 tablet 0   No current facility-administered medications for this visit.     PHYSICAL EXAMINATION: ECOG PERFORMANCE STATUS: 2 - Symptomatic, <50% confined to bed  Vitals:   09/15/18 1405  BP: (!) 136/57   Pulse: 74  Resp: 18  Temp: 98.1 F (36.7 C)  SpO2: 100%   Filed Weights   09/15/18 1405  Weight: 144 lb 3.2 oz (65.4 kg)    GENERAL:alert, no distress and comfortable SKIN: skin color, texture, turgor are normal, no rashes or significant lesions EYES: normal, Conjunctiva are pink and non-injected, sclera clear OROPHARYNX:no exudate, no erythema and lips, buccal mucosa, and tongue normal  NECK: supple, thyroid normal size, non-tender, without nodularity LYMPH:  no palpable lymphadenopathy in the cervical, axillary or inguinal LUNGS: clear to auscultation and percussion with normal breathing effort HEART: regular rate & rhythm and no murmurs and no lower extremity edema ABDOMEN:abdomen soft, non-tender and normal bowel sounds Musculoskeletal:no cyanosis of digits and no clubbing  NEURO: alert & oriented x 3 with fluent speech, no focal motor/sensory deficits  LABORATORY DATA:  I have reviewed the data as listed    Component Value Date/Time   NA 137 08/29/2018 0926   NA 137 05/31/2017 0931   K 3.9 08/29/2018 0926   K 3.5 05/31/2017 0931   CL 99 08/29/2018 0926   CL 99 12/20/2012 1336   CO2 25 08/29/2018 0926   CO2 25 05/31/2017 0931   GLUCOSE 102 (H) 08/29/2018 0926   GLUCOSE 161 (H) 05/31/2017 0931   GLUCOSE 110 (H) 12/20/2012 1336   BUN 10 08/29/2018 0926   BUN 10.9 05/31/2017 0931   CREATININE 0.69 08/29/2018 0926   CREATININE 0.63 07/05/2018 1009   CREATININE 0.8 05/31/2017 0931   CALCIUM 10.4 (H) 08/29/2018 0926   CALCIUM 10.6 (H) 05/31/2017 0931   PROT 7.3 08/29/2018 0926   PROT 7.5 05/31/2017 0931   ALBUMIN 3.7 08/29/2018 0926   ALBUMIN 4.2 05/31/2017 0931   AST 18 08/29/2018 0926   AST 16 05/31/2017 0931   ALT 10 08/29/2018 0926   ALT 15 05/31/2017 0931   ALKPHOS 76 08/29/2018 0926   ALKPHOS 91 05/31/2017 0931   BILITOT 0.5 08/29/2018 0926   BILITOT 0.33 05/31/2017 0931   GFRNONAA >60 08/29/2018 0926   GFRNONAA 87 07/05/2018 1009   GFRAA >60  08/29/2018 0926   GFRAA 100 07/05/2018 1009    No results found for: SPEP, UPEP  Lab Results  Component Value Date   WBC 8.0 09/06/2018   NEUTROABS 3.1 08/29/2018   HGB 12.2 09/06/2018   HCT 37.8 09/06/2018   MCV 77.9 (  L) 09/06/2018   PLT 463 (H) 09/06/2018      Chemistry      Component Value Date/Time   NA 137 08/29/2018 0926   NA 137 05/31/2017 0931   K 3.9 08/29/2018 0926   K 3.5 05/31/2017 0931   CL 99 08/29/2018 0926   CL 99 12/20/2012 1336   CO2 25 08/29/2018 0926   CO2 25 05/31/2017 0931   BUN 10 08/29/2018 0926   BUN 10.9 05/31/2017 0931   CREATININE 0.69 08/29/2018 0926   CREATININE 0.63 07/05/2018 1009   CREATININE 0.8 05/31/2017 0931      Component Value Date/Time   CALCIUM 10.4 (H) 08/29/2018 0926   CALCIUM 10.6 (H) 05/31/2017 0931   ALKPHOS 76 08/29/2018 0926   ALKPHOS 91 05/31/2017 0931   AST 18 08/29/2018 0926   AST 16 05/31/2017 0931   ALT 10 08/29/2018 0926   ALT 15 05/31/2017 0931   BILITOT 0.5 08/29/2018 0926   BILITOT 0.33 05/31/2017 0931       RADIOGRAPHIC STUDIES: I have personally reviewed the radiological images as listed and agreed with the findings in the report. Ct Chest W Contrast  Result Date: 08/29/2018 CLINICAL DATA:  Endometrial cancer status post [facility] 10/13/2016 with chemotherapy and radiation therapy. Remote history of left breast cancer. Restaging. Large left iliac bone mass on recent lumbar spine MRI performed for pain. EXAM: CT CHEST, ABDOMEN, AND PELVIS WITH CONTRAST TECHNIQUE: Multidetector CT imaging of the chest, abdomen and pelvis was performed following the standard protocol during bolus administration of intravenous contrast. CONTRAST:  144m OMNIPAQUE IOHEXOL 300 MG/ML  SOLN COMPARISON:  08/25/2018 lumbar spine MRI. 09/15/2016 CT abdomen/pelvis. FINDINGS: CT CHEST FINDINGS Cardiovascular: Normal heart size. No significant pericardial effusion/thickening. Left main coronary atherosclerosis. Atherosclerotic nonaneurysmal  thoracic aorta. Normal caliber pulmonary arteries. No central pulmonary emboli. Mediastinum/Nodes: Hypodense 2.4 cm left thyroid lobe nodule. Unremarkable esophagus. No pathologically enlarged axillary, mediastinal or hilar lymph nodes. Lungs/Pleura: No pneumothorax. No pleural effusion. No lung masses. Irregular 1.6 cm medial right middle lobe pulmonary nodular opacity (series 5/image 95) is new since 09/15/2016 CT. Right upper lobe solid 5 mm pulmonary nodule (series 5/image 56), for which no comparison exists. No additional significant pulmonary nodules. Mild patchy subpleural reticulation in the anterior lingula is probably from prior radiation therapy. Musculoskeletal: No aggressive appearing focal osseous lesions. Mild thoracic spondylosis. Stable post lumpectomy changes in the outer left breast. CT ABDOMEN PELVIS FINDINGS Hepatobiliary: Normal liver with no liver mass. Normal gallbladder with no radiopaque cholelithiasis. No biliary ductal dilatation. Pancreas: Normal, with no mass or duct dilation. Spleen: Normal size. No mass. Adrenals/Urinary Tract: Stable appearance of the adrenal glands without discrete adrenal nodules. No hydronephrosis. 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 Simple 2.3 cm upper left renal cyst. Subcentimeter hypodense renal cortical lesion in the lower right kidney is too small to characterize and is stable. No new renal lesions. Normal bladder. Stomach/Bowel: Small hiatal hernia. Otherwise normal nondistended stomach. Normal caliber small bowel with no small bowel wall thickening. Normal appendix. Normal large bowel with no diverticulosis, large bowel wall thickening or pericolonic fat stranding. Vascular/Lymphatic: Atherosclerotic nonaneurysmal abdominal aorta. Patent portal, splenic, hepatic and renal veins. No pathologically enlarged lymph nodes in the abdomen or pelvis. Reproductive: Status post hysterectomy, with no abnormal findings at the vaginal cuff. No adnexal mass.  Other: No pneumoperitoneum, ascites or focal fluid collection. Musculoskeletal: Large lytic expansile medial left iliac crest osseous lesion with significant soft tissue component, measuring 10.2 x 5.9 cm (  series 2/image 86), new since 2017 CT. No additional focal osseous lesions. Mild lumbar spondylosis. IMPRESSION: 1. 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 2. New irregular nodular opacity in the medial right middle lobe, new since 2017 CT. Separate subcentimeter right upper lobe solid pulmonary nodule, for which no comparison exists. These findings are indeterminate for pulmonary metastases and attention on follow-up chest CT in 3 months is recommended. 3. No lymphadenopathy or additional findings of metastatic disease. No tumor recurrence at the hysterectomy margin. 4. Aortic Atherosclerosis (ICD10-I70.0). Additional chronic findings as detailed. Electronically Signed   By: Ilona Sorrel M.D.   On: 08/29/2018 11:59   Mr Lumbar Spine Wo Contrast  Result Date: 08/26/2018 CLINICAL DATA:  Acute left-sided low back pain with left-sided sciatica. History of breast cancer and endometrial cancer. EXAM: MRI LUMBAR SPINE WITHOUT CONTRAST TECHNIQUE: Multiplanar, multisequence MR imaging of the lumbar spine was performed. No intravenous contrast was administered. COMPARISON:  None. FINDINGS: Segmentation:  Normal Alignment:  Normal Vertebrae: Fatty bone marrow throughout L5 and the sacrum compatible with prior pelvic radiation for endometrial cancer. Large destructive mass in the left iliac bone with large soft tissue mass extending into the pelvis. This is incompletely visualized and accurate measurements are not possible. No other bony mass lesion in the lumbar spine. Conus medullaris and cauda equina: Conus extends to the L1-2 level. Conus and cauda equina appear normal. Paraspinal and other soft tissues: Negative for retroperitoneal adenopathy. Left upper pole simple cyst  Disc levels: T12-L1: Small extruded disc fragment on the left extending caudally medial to the pedicle. This could affect the T12 nerve root but there is no definite nerve root compression or stenosis L1-2: Mild disc degeneration and disc bulging without stenosis L2-3: Small left foraminal disc protrusion which is touching the left L2 nerve root. Mild facet degeneration. Normal spinal canal diameter L3-4: Mild disc degeneration with diffuse bulging of the disc. Small foraminal disc protrusions bilaterally. Mild facet degeneration. L4-5: Disc bulging and mild facet degeneration without significant stenosis L5-S1: Mild disc and mild facet degeneration. IMPRESSION: Large destructive mass lesion left iliac bone with large soft tissue mass extending into the posterior soft tissues and pelvis compatible with metastatic disease. Consider follow-up CT chest abdomen pelvis with contrast for further staging. Radiation changes at L5 and the sacrum. No lumbar metastatic deposits Multilevel degenerative changes above. These results will be called to the ordering clinician or representative by the Radiologist Assistant, and communication documented in the PACS or zVision Dashboard. Electronically Signed   By: Franchot Gallo M.D.   On: 08/26/2018 08:37   Ct Abdomen Pelvis W Contrast  Result Date: 08/29/2018 CLINICAL DATA:  Endometrial cancer status post [facility] 10/13/2016 with chemotherapy and radiation therapy. Remote history of left breast cancer. Restaging. Large left iliac bone mass on recent lumbar spine MRI performed for pain. EXAM: CT CHEST, ABDOMEN, AND PELVIS WITH CONTRAST TECHNIQUE: Multidetector CT imaging of the chest, abdomen and pelvis was performed following the standard protocol during bolus administration of intravenous contrast. CONTRAST:  122m OMNIPAQUE IOHEXOL 300 MG/ML  SOLN COMPARISON:  08/25/2018 lumbar spine MRI. 09/15/2016 CT abdomen/pelvis. FINDINGS: CT CHEST FINDINGS Cardiovascular: Normal heart  size. No significant pericardial effusion/thickening. Left main coronary atherosclerosis. Atherosclerotic nonaneurysmal thoracic aorta. Normal caliber pulmonary arteries. No central pulmonary emboli. Mediastinum/Nodes: Hypodense 2.4 cm left thyroid lobe nodule. Unremarkable esophagus. No pathologically enlarged axillary, mediastinal or hilar lymph nodes. Lungs/Pleura: No pneumothorax. No pleural effusion. No lung masses. Irregular 1.6  cm medial right middle lobe pulmonary nodular opacity (series 5/image 95) is new since 09/15/2016 CT. Right upper lobe solid 5 mm pulmonary nodule (series 5/image 56), for which no comparison exists. No additional significant pulmonary nodules. Mild patchy subpleural reticulation in the anterior lingula is probably from prior radiation therapy. Musculoskeletal: No aggressive appearing focal osseous lesions. Mild thoracic spondylosis. Stable post lumpectomy changes in the outer left breast. CT ABDOMEN PELVIS FINDINGS Hepatobiliary: Normal liver with no liver mass. Normal gallbladder with no radiopaque cholelithiasis. No biliary ductal dilatation. Pancreas: Normal, with no mass or duct dilation. Spleen: Normal size. No mass. Adrenals/Urinary Tract: Stable appearance of the adrenal glands without discrete adrenal nodules. No hydronephrosis. 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 Simple 2.3 cm upper left renal cyst. Subcentimeter hypodense renal cortical lesion in the lower right kidney is too small to characterize and is stable. No new renal lesions. Normal bladder. Stomach/Bowel: Small hiatal hernia. Otherwise normal nondistended stomach. Normal caliber small bowel with no small bowel wall thickening. Normal appendix. Normal large bowel with no diverticulosis, large bowel wall thickening or pericolonic fat stranding. Vascular/Lymphatic: Atherosclerotic nonaneurysmal abdominal aorta. Patent portal, splenic, hepatic and renal veins. No pathologically enlarged lymph nodes in the  abdomen or pelvis. Reproductive: Status post hysterectomy, with no abnormal findings at the vaginal cuff. No adnexal mass. Other: No pneumoperitoneum, ascites or focal fluid collection. Musculoskeletal: Large lytic expansile medial left iliac crest osseous lesion with significant soft tissue component, measuring 10.2 x 5.9 cm (series 2/image 86), new since 2017 CT. No additional focal osseous lesions. Mild lumbar spondylosis. IMPRESSION: 1. 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 2. New irregular nodular opacity in the medial right middle lobe, new since 2017 CT. Separate subcentimeter right upper lobe solid pulmonary nodule, for which no comparison exists. These findings are indeterminate for pulmonary metastases and attention on follow-up chest CT in 3 months is recommended. 3. No lymphadenopathy or additional findings of metastatic disease. No tumor recurrence at the hysterectomy margin. 4. Aortic Atherosclerosis (ICD10-I70.0). Additional chronic findings as detailed. Electronically Signed   By: Ilona Sorrel M.D.   On: 08/29/2018 11:59   Ct Biopsy  Result Date: 09/06/2018 CLINICAL DATA:  Remote history of breast carcinoma and more recent history of endometrial carcinoma. Presenting with large destructive soft tissue mass of the left iliac fossa causing bony destruction of the left iliac bone. EXAM: CT GUIDED CORE BIOPSY OF LEFT ILIAC BONE MASS ANESTHESIA/SEDATION: 1.0 mg IV Versed; 50 mcg IV Fentanyl Total Moderate Sedation Time:  15 minutes. The patient's level of consciousness and physiologic status were continuously monitored during the procedure by Radiology nursing. PROCEDURE: The procedure risks, benefits, and alternatives were explained to the patient. Questions regarding the procedure were encouraged and answered. The patient understands and consents to the procedure. A time-out was performed prior to initiating the procedure. The patient was placed in a  prone position and initial CT the pelvis performed in a prone position. The left gluteal region was prepped with chlorhexidine in a sterile fashion, and a sterile drape was applied covering the operative field. A sterile gown and sterile gloves were used for the procedure. Local anesthesia was provided with 1% Lidocaine. Under CT guidance, a 17 gauge trocar needle was advanced into a soft tissue mass in the left iliac fossa centered in the left iliac bone. Four separate 18 gauge core biopsy samples were obtained and submitted in formalin. COMPLICATIONS: None FINDINGS: Huge destructive soft tissue mass  of the left iliac fossa measures approximately 11.3 x 7.8 cm in greatest transverse dimensions and is causing destruction of the left iliac crest and superior left iliac bone, extending into the superior aspect of the left SI joint. Soft tissue mass also extends into the superior aspect of the left gluteal musculature. Solid tissue was obtained from the mass. IMPRESSION: CT-guided core biopsy performed of huge mass in the left iliac fossa destroying the left iliac bone. Electronically Signed   By: Aletta Edouard M.D.   On: 09/06/2018 11:03    All questions were answered. The patient knows to call the clinic with any problems, questions or concerns. No barriers to learning was detected.  I spent 40 minutes counseling the patient face to face. The total time spent in the appointment was 55 minutes and more than 50% was on counseling and review of test results  [name], MD 09/16/2018 2:53 PM

## 2018-09-16 NOTE — Therapy (Signed)
[place], [place], [address] Phone: [phone]   Fax:  [phone]  Physical Therapy Evaluation  Patient Details  Name: Tammy Hensley MRN: [id number] Date of Birth: [birth year] Referring Provider (PT): Alvy Bimler   Encounter Date: 09/16/2018  PT End of Session - 09/16/18 1212    Visit Number  1    Number of Visits  9    Date for PT Re-Evaluation  10/14/18    PT Start Time  1020    PT Stop Time  1100    PT Time Calculation (min)  40 min    Activity Tolerance  Patient tolerated treatment well    Behavior During Therapy  [facility] for tasks assessed/performed       Past Medical History:  Diagnosis Date  . Avascular necrosis of bones of both hips ([place]) 11/17/2016  . Breast cancer ([place]) 2006   left breast  . Depression   . Diabetes ([place])    borderline  . Family history of cancer   . History of brachytherapy 03/29/17-04/12/17   vaginal cuff 18 Gy in 3 fractions  . History of radiation therapy 02/10/17-03/16/17   pelvis 45 Gy in 25 fractions  . Hyperlipidemia   . Hypertension   . Iron deficiency anemia   . Vitamin D deficiency     Past Surgical History:  Procedure Laterality Date  . BREAST LUMPECTOMY Left 2006  . BREAST SURGERY  2006   left breast lumpectomy- radiation, took Femara   . 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 ROBOTIC ASSISTED LAP VAGINAL HYSTERECTOMY N/A 10/13/2016   Procedure: XI ROBOTIC ASSISTED LAPAROSCOPIC VAGINAL HYSTERECTOMY;  Surgeon: Nancy Marus, MD;  Location: [facility];  Service: Gynecology;  Laterality: N/A;  . ROBOTIC ASSISTED SALPINGO OOPHERECTOMY Bilateral 10/13/2016   Procedure: XI ROBOTIC ASSISTED SALPINGO OOPHORECTOMY;  Surgeon: Nancy Marus, MD;  Location: [facility];  Service: Gynecology;  Laterality: Bilateral;  . SENTINEL NODE BIOPSY N/A 10/13/2016   Procedure: SENTINEL NODE BIOPSY;  Surgeon:  Nancy Marus, MD;  Location: [facility];  Service: Gynecology;  Laterality: N/A;    There were no vitals filed for this visit.   Subjective Assessment - 09/16/18 1025    Subjective  My pain has been terrible. I couldn't walk and they are surprised I am walking now. In 2017 I was diagnosed with endometrial cancer and I was cleared and now I have it again. I had breast cancer 14 years ago. I had a complete hysterectomy the first go round and I went through chemo and radiation and I was cleared then 6 months later this showed up. I am doing radiation now and I will be done on the 5th of November and I will begin chemo again. I am getting radiation to the left hip and groin. I just started walking again last week and I was in a wheelchair.     Pertinent History  08/25/2005- L breast cancer, 10/13/16 Endometrial Cancer: Surgery: Total robotic hysterectomy bilateral salpingo-oophorectomy, bilateralpelvic sentinellymph node removal, left PA sentinel lymph node removal. Mini-laparotomy for specimen removal, pt completed chemo and radiation, 08/26/18: Recurrence of endometrial cancer, Large destructive mass lesion left iliac bone with large soft tissue mass extending into the posterior soft tissues and pelvis compatible with metastatic disease. Consider follow-up CT chest abdomen pelvis with contrast for further staging., 08/29/18- 1. Large lytic expansile medial left iliac crest osseous metastasis,  new since 2017 CT. 09/06/18: CT-guided core biopsy performed of huge mass in the left iliac fossa destroying the left iliac bone., pt is undergoing palliative radiation and will begin chemo soon    Patient Stated Goals  to get stronger    Currently in Pain?  No/denies    Pain Score  0-No pain         [facility] PT Assessment - 09/16/18 0001      Assessment   Medical Diagnosis  endometrial cancer    Referring Provider (PT)  Alvy Bimler    Onset Date/Surgical Date  08/23/16   approx   Hand Dominance  Right    Prior  Therapy  none      Precautions   Precautions  Other (comment)   bony mets, at risk for lymphedema in LUE and LLE     Restrictions   Weight Bearing Restrictions  No      Balance Screen   Has the patient fallen in the past 6 months  No    Has the patient had a decrease in activity level because of a fear of falling?   No    Is the patient reluctant to leave their home because of a fear of falling?   No      Home Film/video editor residence    Living Arrangements  Spouse/significant other    Available Help at Discharge  Family    Type of [place] to enter    Entrance Stairs-Number of Steps  3    Entrance Stairs-Rails  Right    [place]  --   some steps throughout   Actuary - 2 wheels      Prior Function   Level of [place]  Retired    Leisure  walking and going to the gym prior to diagnosis      Cognition   Overall Cognitive Status  Within Functional Limits for tasks assessed      Ambulation/Gait   Gait Pattern  Step-through pattern   slightly decreased foot clearance bilaterally     Timed Up and Go Test   TUG  Normal TUG    Normal TUG (seconds)  10                Objective measurements completed on examination: See above findings.              PT Education - 09/16/18 1223    Education Details  importance of walking, not overdoing things, practicing sit to stands at home to increase LE strength    Person(s) Educated  Patient    Methods  Explanation    Comprehension  Verbalized understanding          PT Long Term Goals - 09/16/18 1220      PT LONG TERM GOAL #1   Title  Pt will be able to complete 13 sit to stands in 30 seconds to decrease fall risk    Baseline  12.9    Time  4    Period  Weeks    Status  New    Target Date  10/14/18      PT LONG TERM GOAL #2   Title  Pt will report a 50% improvement in pain in left hip and thigh to allow  improved comfort.    Time  4    Period  Weeks  Status  New    Target Date  10/14/18      PT LONG TERM GOAL #3   Title  Pt will be independent in a home exercise program for continued stretching and strengthening    Time  4    Period  Weeks    Status  New    Target Date  10/14/18      PT LONG TERM GOAL #4   Title  Pt will demonstrate 5/5 hip strength bilaterally to decrease risk of falling    Baseline  R 4/5, L 4+/5    Time  4    Period  Weeks    Status  New    Target Date  10/14/18      PT LONG TERM GOAL #5   Title  Pt will demonstrate 5/5 bilateral ankle dorsiflexor strength to decrease fall risk    Baseline  4/5    Time  4    Period  Weeks    Status  New    Target Date  10/14/18             Plan - 09/16/18 1212    Clinical Impression Statement  Pt presents to PT with pain in left hip and thigh and recently was unable to walk long distances and had to use a wheelchair. She has a previous history of left breast cancer (2006) , endometrial cancer in 2017 with recurrence in 2019. She now has mets to her left pelvis and is currently undergoing palliative radiation. She reports since beginning radiation she is now able to walk within the last week because the radiation has helped decrease the pain in her bones. She does have LE weakness and was only able to complete 8 sit to stands in 30 seconds and normative date for her age is 12.9 sit to stands. Pt would benefit from skilled PT services to increase LE strength and endurance to decrease fall risk and allow pt to continue to be independent. Pt was very active prior to recurrence and exercised frequently. She is very motivated to participate.     History and Personal Factors relevant to plan of care:  previous hx of breast cancer and endometrial cancer, currently undergoing radiation, bony mets    Clinical Presentation  Unstable    Clinical Presentation due to:  recent diagnosis of bony mets, undergoing palliative radiation,  will begin chemo soon    Rehab Potential  Good    Clinical Impairments Affecting Rehab Potential  bony mets, hx of radiation    PT Frequency  2x / week    PT Duration  4 weeks    PT Treatment/Interventions  ADLs/Self Care Home Management;Therapeutic activities;Therapeutic exercise;Patient/family education;Neuromuscular re-education;Manual techniques;Taping;Passive range of motion;Balance training    PT Next Visit Plan  be careful of bony mets in left pelvis, begin gentle LE strengthening, 4 way hip on air ex, NuStep etc    PT Home Exercise Plan  sit to stands, walking program    Consulted and Agree with Plan of Care  Patient       Patient will benefit from skilled therapeutic intervention in order to improve the following deficits and impairments:  Abnormal gait, Pain, Decreased endurance, Decreased activity tolerance, Decreased strength, Decreased balance  Visit Diagnosis: Muscle weakness (generalized) - Plan: PT plan of care cert/re-cert  Difficulty in walking, not elsewhere classified - Plan: PT plan of care cert/re-cert     Problem List Patient Active Problem List   Diagnosis Date Noted  .  Metastasis to bone ([place]) 08/30/2018  . 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 Cancer associated pain 08/30/2018  . 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 Other constipation 08/30/2018  . Preventive measure 08/30/2018  . Physical debility 08/30/2018  . 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  . Insomnia 09/07/2017  . T2_NIDDM 01/06/2017  . Genetic testing 01/01/2017  . Family history of cancer   . FIGO stage II endometrial cancer ([place]) 11/17/2016  . Atherosclerosis of abdominal aorta ([place]) 11/17/2016  . Endometrial cancer ([place]) 09/30/2016  . Osteopenia 11/22/2015  . BMI 25.0-25.9,adult 08/24/2015  . Medication management 01/03/2014  . Breast cancer, left breast ([place]) 12/19/2013  . Hypothyroidism   . Hypertension   . Hyperlipidemia   . Vitamin D deficiency   . Iron deficiency anemia   .  Malignant neoplasm of upper-outer quadrant of left breast in female, estrogen receptor positive ([place]) 12/05/2012    Allyson Sabal [facility] 09/16/2018, 12:25 PM  [place], [place], [address] Phone: [phone]   Fax:  [phone]  Name: Tammy Hensley MRN: [id number] Date of Birth: Apr 03, 1941  Manus Gunning, PT 09/16/18 12:25 PM

## 2018-09-16 NOTE — Assessment & Plan Note (Signed)
I have reviewed pathology report with the patient Recent biopsy suggested recurrent endometrial cancer I will get pathology department to add additional molecular study She tolerated prior treatment well I recommend port placement and treatment with combination carboplatin and Taxol Due to her recent radiation treatment, I plan to reduce a dose of chemotherapy a little bit due to anticipated increased risk of pancytopenia and infection  We reviewed the NCCN guidelines We discussed the role of chemotherapy. The intent is of palliative intent.  We discussed some of the risks, benefits, side-effects of carboplatin & Taxol. Treatment is intravenous, every 3 weeks x 6 cycles  Some of the short term side-effects included, though not limited to, including weight loss, life threatening infections, risk of allergic reactions, need for transfusions of blood products, nausea, vomiting, change in bowel habits, loss of hair, admission to hospital for various reasons, and risks of death.   Long term side-effects are also discussed including risks of infertility, permanent damage to nerve function, hearing loss, chronic fatigue, kidney damage with possibility needing hemodialysis, and rare secondary malignancy including bone marrow disorders.  The patient is aware that the response rates discussed earlier is not guaranteed.  After a long discussion, patient made an informed decision to proceed with the prescribed plan of care.   Patient education material was dispensed. We discussed premedication with dexamethasone before chemotherapy. I would not recommend prophylactic G-CSF support for now I recommend minimum 3 cycles of treatment before repeat imaging study I recommend waiting at least a week after radiation therapy is completed before starting her on chemotherapy

## 2018-09-16 NOTE — Assessment & Plan Note (Signed)
This is improved.  She has gained weight.  Continue low-dose dexamethasone She is advised to monitor her blood sugar

## 2018-09-16 NOTE — Assessment & Plan Note (Signed)
The patient is aware she has incurable disease and treatment is strictly palliative. We discussed importance of Advanced Directives and Living will. 

## 2018-09-16 NOTE — Assessment & Plan Note (Signed)
Overall improving.  She will continue radiation therapy

## 2018-09-16 NOTE — Assessment & Plan Note (Addendum)
Much improved.  I will change dexamethasone 2 mg to once a with plan for dexamethasone taper upon completion of radiation treatment along with scheduled tramadol 3 times a day and gabapentin at nighttime

## 2018-09-16 NOTE — Assessment & Plan Note (Signed)
I recommend physical therapy and rehab

## 2018-09-19 ENCOUNTER — Ambulatory Visit
Admission: RE | Admit: 2018-09-19 | Discharge: 2018-09-19 | Disposition: A | Discharge status: Home / Self Care | Payer: Medicare Other | Source: Ambulatory Visit | Attending: Radiation Oncology | Admitting: Radiation Oncology

## 2018-09-19 ENCOUNTER — Ambulatory Visit: Payer: Medicare Other

## 2018-09-19 DIAGNOSIS — M6281 Muscle weakness (generalized): Secondary | ICD-10-CM

## 2018-09-19 DIAGNOSIS — R262 Difficulty in walking, not elsewhere classified: Secondary | ICD-10-CM

## 2018-09-19 DIAGNOSIS — Z510 Encounter for antineoplastic radiation therapy: Secondary | ICD-10-CM | POA: Diagnosis not present

## 2018-09-19 NOTE — Patient Instructions (Signed)
Cancer Rehab [phone] HIP: Flexion Standing    Holding onto counter lift leg straight in front keeping knee straight. Slow and controlled! _10__ reps per set, _2-3__ sets per day. Then repeat with other leg.   Hip Extension (Standing)    Stand with support at counter. Squeeze pelvic floor and hold so as not to twist hips and don't lean forward.  Move right leg backward with straight knee. Slow and controlled! Repeat _10__ times. Do _2-3__ times a day. Repeat with other leg.  Hip Abduction (Standing)    Stand with support. Squeeze pelvic floor and hold. Lift right leg out to side, keeping toe forward.  Repeat _10__ times. Do _2-3__ times a day. Repeat with other leg.   Heel Raise: Bilateral (Standing)      Stand near counter for fingertip support if needed. Rise on balls of feet. Repeat __10-20__ times per set. Do _1-2___ sets per session. Do __2__ sessions per day.  SINGLE LIMB STANCE    Stand at counter (corner if you have one) for minimal arm support. Raise leg. Hold _10-20__ seconds. Repeat with other leg. Once this becomes easier, for increased challenge, close eyes with fingertips on counter for safety. _3-5__ reps per set, _2-3__ sets per day.  Tandem Stance    Stand at counter (corner if you have one). Right foot in front of left, heel touching toe both feet "straight ahead". Stand on Foot Triangle of Support with both feet. Balance in this position _10-20__ seconds. Do with left foot in front of right. Once this becomes easier, for increased challenge, close eyes with fingertips on counter for safety.  Step-Up: Forward    Move step-stool to counter or hold rail if at steps, but as little as able. Step up forward keeping opposite foot off step. Keep pelvis level and back straight. Hold the single leg stand for 3 seconds, then come back down slowly.  Do _10__ times, do _1-2_ sets, on each leg, _1-2__ times per day.  Squats    Standing in front of  chair at dining table. Feet shoulder width apart, relax leg muscles. Squat towards chair and hold a squat _3-5__ seconds. Return to standing contracting core and gluts. Repeat _10__ times per set. Do _2__ times per day.

## 2018-09-19 NOTE — Therapy (Signed)
[place], [place], [address] Phone: [phone]   Fax:  [phone]  Physical Therapy Treatment  Patient Details  Name: Tammy Hensley MRN: [id number] Date of Birth: 09/20/1941 Referring Provider (PT): Alvy Bimler   Encounter Date: 09/19/2018  PT End of Session - 09/19/18 1022    Visit Number  2    Number of Visits  9    Date for PT Re-Evaluation  10/14/18    PT Start Time  0937    PT Stop Time  1020    PT Time Calculation (min)  43 min    Activity Tolerance  Patient tolerated treatment well    Behavior During Therapy  [facility] for tasks assessed/performed       Past Medical History:  Diagnosis Date  . Avascular necrosis of bones of both hips ([place]) 11/17/2016  . Breast cancer ([place]) 2006   left breast  . Depression   . Diabetes ([place])    borderline  . Family history of cancer   . History of brachytherapy 03/29/17-04/12/17   vaginal cuff 18 Gy in 3 fractions  . History of radiation therapy 02/10/17-03/16/17   pelvis 45 Gy in 25 fractions  . Hyperlipidemia   . Hypertension   . Iron deficiency anemia   . Vitamin D deficiency     Past Surgical History:  Procedure Laterality Date  . BREAST LUMPECTOMY Left 2006  . BREAST SURGERY  2006   left breast lumpectomy- radiation, took Femara   . 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 ROBOTIC ASSISTED LAP VAGINAL HYSTERECTOMY N/A 10/13/2016   Procedure: XI ROBOTIC ASSISTED LAPAROSCOPIC VAGINAL HYSTERECTOMY;  Surgeon: Nancy Marus, MD;  Location: [facility];  Service: Gynecology;  Laterality: N/A;  . ROBOTIC ASSISTED SALPINGO OOPHERECTOMY Bilateral 10/13/2016   Procedure: XI ROBOTIC ASSISTED SALPINGO OOPHORECTOMY;  Surgeon: Nancy Marus, MD;  Location: [facility];  Service: Gynecology;  Laterality: Bilateral;  . SENTINEL NODE BIOPSY N/A 10/13/2016   Procedure: SENTINEL NODE BIOPSY;  Surgeon:  Nancy Marus, MD;  Location: [facility];  Service: Gynecology;  Laterality: N/A;    There were no vitals filed for this visit.  Subjective Assessment - 09/19/18 0943    Subjective  My Lt hip is feeling so much better since starting radiation! I have no pain now and am able to walk. My Lt hip isn't holding me back from community ambulation now, LE fatigue is all I notice but that hasn't stopped from doing what I need to do. I finish radiation 09/27/18 and then start chemo 10/04/18.     Pertinent History  08/25/2005- L breast cancer, 10/13/16 Endometrial Cancer: Surgery: Total robotic hysterectomy bilateral salpingo-oophorectomy, bilateralpelvic sentinellymph node removal, left PA sentinel lymph node removal. Mini-laparotomy for specimen removal, pt completed chemo and radiation, 08/26/18: Recurrence of endometrial cancer, Large destructive mass lesion left iliac bone with large soft tissue mass extending into the posterior soft tissues and pelvis compatible with metastatic disease. Consider follow-up CT chest abdomen pelvis with contrast for further staging., 08/29/18- 1. 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 09/06/18: CT-guided core biopsy performed of huge mass in the left iliac fossa destroying the left iliac bone., pt is undergoing palliative radiation and will begin chemo soon    Patient Stated Goals  to get stronger    Currently in Pain?  No/denies  [facility] Adult PT Treatment/Exercise - 09/19/18 0001      Neuro Re-ed    Neuro Re-ed Details   In // bars: Heel-toe walking and then toe walking 2x each, squats in front of mirror 2x5 with demo and VCs througout for technique and full ROM with coming back to stand (engage core and glut squeeze); seated rest required after due to LE fatigue      Knee/Hip Exercises: Aerobic   Nustep  Level 6, 10 mins with PTA present to monitor pt but she tolerated this very well.        Knee/Hip Exercises: Standing   Hip Flexion  Stengthening;Right;Left;10 reps;Knee straight   In // bars on Airex   Hip Flexion Limitations  VCs for control of ROM and to keep knee straight throughout and foot elevated for contralateral SLS balance    Hip Abduction  Stengthening;Right;Left;10 reps   In // bars on Airex   Abduction Limitations  Demo and VCs to dec hip er    Hip Extension  Stengthening;Right;Left;10 reps;Knee straight   In // bars on Airex   Extension Limitations  VCs for glut contraction and to keep hips level and decrease front trunk lean             PT Education - 09/19/18 1023    Education Details  Bil LE strength and balance cautioning pt that if she starts having any Lt hip pain to stop movements (or any in general) and she verbalized understanding    Person(s) Educated  Patient    Methods  Explanation;Demonstration;Handout    Comprehension  Verbalized understanding;Returned demonstration;Need further instruction          PT Long Term Goals - 09/16/18 1220      PT LONG TERM GOAL #1   Title  Pt will be able to complete 13 sit to stands in 30 seconds to decrease fall risk    Baseline  12.9    Time  4    Period  Weeks    Status  New    Target Date  10/14/18      PT LONG TERM GOAL #2   Title  Pt will report a 50% improvement in pain in left hip and thigh to allow improved comfort.    Time  4    Period  Weeks    Status  New    Target Date  10/14/18      PT LONG TERM GOAL #3   Title  Pt will be independent in a home exercise program for continued stretching and strengthening    Time  4    Period  Weeks    Status  New    Target Date  10/14/18      PT LONG TERM GOAL #4   Title  Pt will demonstrate 5/5 hip strength bilaterally to decrease risk of falling    Baseline  R 4/5, L 4+/5    Time  4    Period  Weeks    Status  New    Target Date  10/14/18      PT LONG TERM GOAL #5   Title  Pt will demonstrate 5/5 bilateral ankle dorsiflexor strength to  decrease fall risk    Baseline  4/5    Time  4    Period  Weeks    Status  New    Target Date  10/14/18            Plan - 09/19/18 1032  Clinical Impression Statement  First session of bil LE hip strength and balance activities. Pt tolerated all very well and had no Lt hip pain for any activities. She does demonstrate weakness at Lt hip with activites that involved SLS (Rt LE hip 3 ways) and tactile and VCs were required to keep hips level and for postural corrections throughout. Pt was able to correct though continued to verbalize feeling weaker at Lt hip. She was cautioned to not continue any of these exs if she started to develop any pain as this could be in regards to her disease progression and pt verbalized understanding this. She reported feeling very encouraged with all she was able to do today.     Rehab Potential  Good    Clinical Impairments Affecting Rehab Potential  bony mets, hx of radiation    PT Frequency  2x / week    PT Duration  4 weeks    PT Treatment/Interventions  ADLs/Self Care Home Management;Therapeutic activities;Therapeutic exercise;Patient/family education;Neuromuscular re-education;Manual techniques;Taping;Passive range of motion;Balance training    PT Next Visit Plan  be careful of bony mets in left pelvis,cont gentle LE strengthening and NuStep; review and progress HEP prn    PT Home Exercise Plan  sit to stands, walking program; bil LE hip and balance activities    Consulted and Agree with Plan of Care  Patient       Patient will benefit from skilled therapeutic intervention in order to improve the following deficits and impairments:  Abnormal gait, Pain, Decreased endurance, Decreased activity tolerance, Decreased strength, Decreased balance  Visit Diagnosis: Muscle weakness (generalized)  Difficulty in walking, not elsewhere classified     Problem List Patient Active Problem List   Diagnosis Date Noted  . Goals of care, counseling/discussion  09/16/2018  . Metastasis to bone ([place]) 08/30/2018  . Metastasis to lung ([place]) 08/30/2018  . Cancer associated pain 08/30/2018  . 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 Other constipation 08/30/2018  . Preventive measure 08/30/2018  . Physical debility 08/30/2018  . 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  . Insomnia 09/07/2017  . T2_NIDDM 01/06/2017  . Genetic testing 01/01/2017  . Family history of cancer   . FIGO stage II endometrial cancer ([place]) 11/17/2016  . Atherosclerosis of abdominal aorta ([place]) 11/17/2016  . Endometrial cancer ([place]) 09/30/2016  . Osteopenia 11/22/2015  . BMI 25.0-25.9,adult 08/24/2015  . Medication management 01/03/2014  . Breast cancer, left breast ([place]) 12/19/2013  . Hypothyroidism   . Hypertension   . Hyperlipidemia   . Vitamin D deficiency   . Iron deficiency anemia   . Malignant neoplasm of upper-outer quadrant of left breast in female, estrogen receptor positive ([place]) 12/05/2012    Otelia Limes, PTA 09/19/2018, 10:41 AM  [place], [place], [address] Phone: [phone]   Fax:  [phone]  Name: Tammy Hensley MRN: [id number] Date of Birth: Feb 07, 1941

## 2018-09-20 ENCOUNTER — Ambulatory Visit
Admission: RE | Admit: 2018-09-20 | Discharge: 2018-09-20 | Disposition: A | Discharge status: Home / Self Care | Payer: Medicare Other | Source: Ambulatory Visit | Attending: Radiation Oncology | Admitting: Radiation Oncology

## 2018-09-20 DIAGNOSIS — Z510 Encounter for antineoplastic radiation therapy: Secondary | ICD-10-CM | POA: Diagnosis not present

## 2018-09-21 ENCOUNTER — Ambulatory Visit
Admission: RE | Admit: 2018-09-21 | Discharge: 2018-09-21 | Disposition: A | Discharge status: Home / Self Care | Payer: Medicare Other | Source: Ambulatory Visit | Attending: Radiation Oncology | Admitting: Radiation Oncology

## 2018-09-21 ENCOUNTER — Ambulatory Visit: Payer: Medicare Other

## 2018-09-21 DIAGNOSIS — M6281 Muscle weakness (generalized): Secondary | ICD-10-CM

## 2018-09-21 DIAGNOSIS — Z510 Encounter for antineoplastic radiation therapy: Secondary | ICD-10-CM | POA: Diagnosis not present

## 2018-09-21 DIAGNOSIS — R262 Difficulty in walking, not elsewhere classified: Secondary | ICD-10-CM

## 2018-09-21 NOTE — Therapy (Signed)
[place], [place], [address] Phone: [phone]   Fax:  [phone]  Physical Therapy Treatment  Patient Details  Name: Tammy Hensley MRN: [id number] Date of Birth: [birth year] Referring Provider (PT): Alvy Bimler   Encounter Date: 09/21/2018  PT End of Session - 09/21/18 1312    Visit Number  3    Number of Visits  9    Date for PT Re-Evaluation  10/14/18    PT Start Time  1301    PT Stop Time  1349    PT Time Calculation (min)  48 min    Activity Tolerance  Patient tolerated treatment well    Behavior During Therapy  [facility] for tasks assessed/performed       Past Medical History:  Diagnosis Date  . Avascular necrosis of bones of both hips ([place]) 11/17/2016  . Breast cancer ([place]) 2006   left breast  . Depression   . Diabetes ([place])    borderline  . Family history of cancer   . History of brachytherapy 03/29/17-04/12/17   vaginal cuff 18 Gy in 3 fractions  . History of radiation therapy 02/10/17-03/16/17   pelvis 45 Gy in 25 fractions  . Hyperlipidemia   . Hypertension   . Iron deficiency anemia   . Vitamin D deficiency     Past Surgical History:  Procedure Laterality Date  . BREAST LUMPECTOMY Left 2006  . BREAST SURGERY  2006   left breast lumpectomy- radiation, took Femara   . 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 ROBOTIC ASSISTED LAP VAGINAL HYSTERECTOMY N/A 10/13/2016   Procedure: XI ROBOTIC ASSISTED LAPAROSCOPIC VAGINAL HYSTERECTOMY;  Surgeon: Nancy Marus, MD;  [facility];  Service: Gynecology;  Laterality: N/A;  . ROBOTIC ASSISTED SALPINGO OOPHERECTOMY Bilateral 10/13/2016   Procedure: XI ROBOTIC ASSISTED SALPINGO OOPHORECTOMY;  Surgeon: Nancy Marus, MD;  [facility];  Service: Gynecology;  Laterality: Bilateral;  . SENTINEL NODE BIOPSY N/A 10/13/2016   Procedure: SENTINEL NODE BIOPSY;  Surgeon:  Nancy Marus, MD;  [facility];  Service: Gynecology;  Laterality: N/A;    There were no vitals filed for this visit.  Subjective Assessment - 09/21/18 1307    Subjective  I can tell we exercised muscles that I haven't worked in [employer]. But it's just muscle soreness, especially at my thighs, Lt>Rt.     Pertinent History  08/25/2005- L breast cancer, 10/13/16 Endometrial Cancer: Surgery: Total robotic hysterectomy bilateral salpingo-oophorectomy, bilateralpelvic sentinellymph node removal, left PA sentinel lymph node removal. Mini-laparotomy for specimen removal, pt completed chemo and radiation, 08/26/18: Recurrence of endometrial cancer, Large destructive mass lesion left iliac bone with large soft tissue mass extending into the posterior soft tissues and pelvis compatible with metastatic disease. Consider follow-up CT chest abdomen pelvis with contrast for further staging., 08/29/18- 1. 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 09/06/18: CT-guided core biopsy performed of huge mass in the left iliac fossa destroying the left iliac bone., pt is undergoing palliative radiation and will begin chemo soon    Patient Stated Goals  to get stronger    Currently in Pain?  No/denies                       [facility] Adult PT Treatment/Exercise - 09/21/18 0001      Neuro Re-ed    Neuro Re-ed  Details   In // bars: Heel-toe walking and then toe walking 2x each, squats in front of mirror 2x5 with demo and VCs througout for technique and full ROM with coming back to stand (engage core and glut squeeze); seated rest required after due to LE fatigue      Knee/Hip Exercises: Stretches   Passive Hamstring Stretch  Right;Left;2 reps;30 seconds   seated edge of chair   Quad Stretch  Right;Left;2 reps;20 seconds   with foot on chair    Piriformis Stretch  Right;Left;2 reps;20 seconds   seated edge of chair   Piriformis Stretch Limitations  unable to feel  stretch at Rt due to tightness at Lt      Knee/Hip Exercises: Aerobic   Nustep  Level 6, 10 mins with PTA present to monitor pt      Knee/Hip Exercises: Standing   Hip Flexion  Stengthening;Right;Left;10 reps;Knee straight   On blue oval in // bars   Hip Flexion Limitations  VCs to remind pt of correct technique and core enagement to keep pelvis level    Forward Lunges  Right;Left;Other (comment)   3x each leg in // bars with demo throughout   Forward Lunges Limitations  very challenging for pt but no pain, just muscle weakness    Hip Abduction  Stengthening;Right;Left;10 reps   On blue oval in // bars   Abduction Limitations  Demo and VCs to dec hip er, "lead with heel"    Hip Extension  Stengthening;Right;Left;10 reps;Knee straight   On blue oval in // bars   Extension Limitations  Tactile cuing to decrease lumbar ext at end of Lt ROM and VCs for core engagment as well; pt reports being able to feel a glut contraction today (could not feel this last session without glut squeeze)    Forward Step Up  Right;Left;10 reps;Hand Hold: 2;Step Height: 6"   Just 5 reps on Rt as pt reports this easier   Other Standing Knee Exercises  Hip hiking 10x on each side on 6" step with demo and tactile cuing for technique                  PT Long Term Goals - 09/16/18 1220      PT LONG TERM GOAL #1   Title  Pt will be able to complete 13 sit to stands in 30 seconds to decrease fall risk    Baseline  12.9    Time  4    Period  Weeks    Status  New    Target Date  10/14/18      PT LONG TERM GOAL #2   Title  Pt will report a 50% improvement in pain in left hip and thigh to allow improved comfort.    Time  4    Period  Weeks    Status  New    Target Date  10/14/18      PT LONG TERM GOAL #3   Title  Pt will be independent in a home exercise program for continued stretching and strengthening    Time  4    Period  Weeks    Status  New    Target Date  10/14/18      PT LONG TERM GOAL  #4   Title  Pt will demonstrate 5/5 hip strength bilaterally to decrease risk of falling    Baseline  R 4/5, L 4+/5    Time  4    Period  Weeks  Status  New    Target Date  10/14/18      PT LONG TERM GOAL #5   Title  Pt will demonstrate 5/5 bilateral ankle dorsiflexor strength to decrease fall risk    Baseline  4/5    Time  4    Period  Weeks    Status  New    Target Date  10/14/18            Plan - 09/21/18 1313    Clinical Impression Statement  Continued with balance and bil LE strength, also adding quad flexibility for pts c/o thigh tightness from last sessions workout. She continued to tolerate session very well and requiring only 1 seated rest during due to LE fatigue.      Rehab Potential  Good    Clinical Impairments Affecting Rehab Potential  bony mets, hx of radiation    PT Frequency  2x / week    PT Duration  4 weeks    PT Treatment/Interventions  ADLs/Self Care Home Management;Therapeutic activities;Therapeutic exercise;Patient/family education;Neuromuscular re-education;Manual techniques;Taping;Passive range of motion;Balance training    PT Next Visit Plan  be careful of bony mets in left pelvis,cont gentle LE strengthening and NuStep; review and progress HEP prn    Consulted and Agree with Plan of Care  Patient       Patient will benefit from skilled therapeutic intervention in order to improve the following deficits and impairments:  Abnormal gait, Pain, Decreased endurance, Decreased activity tolerance, Decreased strength, Decreased balance  Visit Diagnosis: Muscle weakness (generalized)  Difficulty in walking, not elsewhere classified     Problem List Patient Active Problem List   Diagnosis Date Noted  . Goals of care, counseling/discussion 09/16/2018  . Metastasis to bone ([place]) 08/30/2018  . Metastasis to lung ([place]) 08/30/2018  . Cancer associated pain 08/30/2018  . 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 Other constipation 08/30/2018  .  Preventive measure 08/30/2018  . Physical debility 08/30/2018  . 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  . Insomnia 09/07/2017  . T2_NIDDM 01/06/2017  . Genetic testing 01/01/2017  . Family history of cancer   . FIGO stage II endometrial cancer ([place]) 11/17/2016  . Atherosclerosis of abdominal aorta ([place]) 11/17/2016  . Endometrial cancer ([place]) 09/30/2016  . Osteopenia 11/22/2015  . BMI 25.0-25.9,adult 08/24/2015  . Medication management 01/03/2014  . Breast cancer, left breast ([place]) 12/19/2013  . Hypothyroidism   . Hypertension   . Hyperlipidemia   . Vitamin D deficiency   . Iron deficiency anemia   . Malignant neoplasm of upper-outer quadrant of left breast in female, estrogen receptor positive ([place]) 12/05/2012    Otelia Limes, PTA 09/21/2018, 1:52 PM  [place], [place], [address] Phone: [phone]   Fax:  [phone]  Name: KANAI HILGER MRN: [id number] Date of Birth: 05-20-1941

## 2018-09-22 ENCOUNTER — Ambulatory Visit
Admission: RE | Admit: 2018-09-22 | Discharge: 2018-09-22 | Disposition: A | Discharge status: Home / Self Care | Payer: Medicare Other | Source: Ambulatory Visit | Attending: Radiation Oncology | Admitting: Radiation Oncology

## 2018-09-22 DIAGNOSIS — Z510 Encounter for antineoplastic radiation therapy: Secondary | ICD-10-CM | POA: Diagnosis not present

## 2018-09-23 ENCOUNTER — Ambulatory Visit
Admission: RE | Admit: 2018-09-23 | Discharge: 2018-09-23 | Disposition: A | Discharge status: Home / Self Care | Payer: Medicare Other | Source: Ambulatory Visit | Attending: Radiation Oncology | Admitting: Radiation Oncology

## 2018-09-23 DIAGNOSIS — C55 Malignant neoplasm of uterus, part unspecified: Secondary | ICD-10-CM | POA: Insufficient documentation

## 2018-09-23 DIAGNOSIS — C7951 Secondary malignant neoplasm of bone: Secondary | ICD-10-CM | POA: Diagnosis not present

## 2018-09-23 DIAGNOSIS — Z510 Encounter for antineoplastic radiation therapy: Secondary | ICD-10-CM | POA: Diagnosis present

## 2018-09-26 ENCOUNTER — Ambulatory Visit
Admission: RE | Admit: 2018-09-26 | Discharge: 2018-09-26 | Disposition: A | Discharge status: Home / Self Care | Payer: Medicare Other | Source: Ambulatory Visit | Attending: Radiation Oncology | Admitting: Radiation Oncology

## 2018-09-26 DIAGNOSIS — Z510 Encounter for antineoplastic radiation therapy: Secondary | ICD-10-CM | POA: Diagnosis not present

## 2018-09-27 ENCOUNTER — Ambulatory Visit: Payer: Medicare Other | Attending: Hematology and Oncology

## 2018-09-27 ENCOUNTER — Encounter: Payer: Self-pay | Admitting: Oncology

## 2018-09-27 ENCOUNTER — Ambulatory Visit
Admission: RE | Admit: 2018-09-27 | Discharge: 2018-09-27 | Disposition: A | Discharge status: Home / Self Care | Payer: Medicare Other | Source: Ambulatory Visit | Attending: Radiation Oncology | Admitting: Radiation Oncology

## 2018-09-27 DIAGNOSIS — M6281 Muscle weakness (generalized): Secondary | ICD-10-CM | POA: Insufficient documentation

## 2018-09-27 DIAGNOSIS — R262 Difficulty in walking, not elsewhere classified: Secondary | ICD-10-CM | POA: Insufficient documentation

## 2018-09-27 DIAGNOSIS — Z510 Encounter for antineoplastic radiation therapy: Secondary | ICD-10-CM | POA: Diagnosis not present

## 2018-09-27 NOTE — Therapy (Addendum)
[place], [place], [address] Phone: [phone]   Fax:  [phone]  Physical Therapy Treatment  Patient Details  Name: Tammy Hensley MRN: [id number] Date of Birth: 15-Mar-1941 Referring Provider (PT): Alvy Bimler   Encounter Date: 09/27/2018  PT End of Session - 09/27/18 1013    Visit Number  4    Number of Visits  9    Date for PT Re-Evaluation  10/14/18    PT Start Time  0937    PT Stop Time  1017    PT Time Calculation (min)  40 min    Activity Tolerance  Patient tolerated treatment well    Behavior During Therapy  [facility] for tasks assessed/performed       Past Medical History:  Diagnosis Date  . Avascular necrosis of bones of both hips ([place]) 11/17/2016  . Breast cancer ([place]) 2006   left breast  . Depression   . Diabetes ([place])    borderline  . Family history of cancer   . History of brachytherapy 03/29/17-04/12/17   vaginal cuff 18 Gy in 3 fractions  . History of radiation therapy 02/10/17-03/16/17   pelvis 45 Gy in 25 fractions  . Hyperlipidemia   . Hypertension   . Iron deficiency anemia   . Vitamin D deficiency     Past Surgical History:  Procedure Laterality Date  . BREAST LUMPECTOMY Left 2006  . BREAST SURGERY  2006   left breast lumpectomy- radiation, took Femara   . 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 ROBOTIC ASSISTED LAP VAGINAL HYSTERECTOMY N/A 10/13/2016   Procedure: XI ROBOTIC ASSISTED LAPAROSCOPIC VAGINAL HYSTERECTOMY;  Surgeon: Nancy Marus, MD;  Location: [facility];  Service: Gynecology;  Laterality: N/A;  . ROBOTIC ASSISTED SALPINGO OOPHERECTOMY Bilateral 10/13/2016   Procedure: XI ROBOTIC ASSISTED SALPINGO OOPHORECTOMY;  Surgeon: Nancy Marus, MD;  Location: [facility];  Service: Gynecology;  Laterality: Bilateral;  . SENTINEL NODE BIOPSY N/A 10/13/2016   Procedure: SENTINEL NODE BIOPSY;  Surgeon:  Nancy Marus, MD;  Location: [facility];  Service: Gynecology;  Laterality: N/A;    There were no vitals filed for this visit.  Subjective Assessment - 09/27/18 0941    Subjective  Felt good after last session. My Lt leg started bothering me some yesterday, it just got stiff on me and still is today. My Lt leg is getting stronger though, I can squat again and when needed with housework.    Pertinent History  08/25/2005- L breast cancer, 10/13/16 Endometrial Cancer: Surgery: Total robotic hysterectomy bilateral salpingo-oophorectomy, bilateralpelvic sentinellymph node removal, left PA sentinel lymph node removal. Mini-laparotomy for specimen removal, pt completed chemo and radiation, 08/26/18: Recurrence of endometrial cancer, Large destructive mass lesion left iliac bone with large soft tissue mass extending into the posterior soft tissues and pelvis compatible with metastatic disease. Consider follow-up CT chest abdomen pelvis with contrast for further staging., 08/29/18- 1. 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 09/06/18: CT-guided core biopsy performed of huge mass in the left iliac fossa destroying the left iliac bone., pt is undergoing palliative radiation and will begin chemo soon    Patient Stated Goals  to get stronger    Currently in Pain?  No/denies                       [facility] Adult PT  Treatment/Exercise - 09/27/18 0001      Neuro Re-ed    Neuro Re-ed Details   In // bars: Heel-toe, then slow, controlled high knee marching (pt demonstrating much improved control), 2x each      Knee/Hip Exercises: Aerobic   Nustep  Level 6, x10 mins with PTA present to monitor pt      Knee/Hip Exercises: Standing   Hip Flexion  Stengthening;Right;Left;10 reps;Knee straight   on blue oval (Rt only) in //bars   Hip Flexion Limitations  Pt with Lt hip mild pain on blue oval today so continued on level surface    Hip Abduction   Stengthening;Right;Left;10 reps   Rt LE on blue oval; in // bars   Abduction Limitations  Tactile cues to decrease hip er    Forward Step Up  Right;Left;10 reps;Hand Hold: 2;Step Height: 4"   In // bars     Manual Therapy   Manual Therapy  Passive ROM    Passive ROM  In Supine: To Lt hip for hamstring, piriformis and hip flexor stretches by therapist, all 3x, 20-30 sec holds                  PT Long Term Goals - 09/16/18 1220      PT LONG TERM GOAL #1   Title  Pt will be able to complete 13 sit to stands in 30 seconds to decrease fall risk    Baseline  12.9    Time  4    Period  Weeks    Status  New    Target Date  10/14/18      PT LONG TERM GOAL #2   Title  Pt will report a 50% improvement in pain in left hip and thigh to allow improved comfort.    Time  4    Period  Weeks    Status  New    Target Date  10/14/18      PT LONG TERM GOAL #3   Title  Pt will be independent in a home exercise program for continued stretching and strengthening    Time  4    Period  Weeks    Status  New    Target Date  10/14/18      PT LONG TERM GOAL #4   Title  Pt will demonstrate 5/5 hip strength bilaterally to decrease risk of falling    Baseline  R 4/5, L 4+/5    Time  4    Period  Weeks    Status  New    Target Date  10/14/18      PT LONG TERM GOAL #5   Title  Pt will demonstrate 5/5 bilateral ankle dorsiflexor strength to decrease fall risk    Baseline  4/5    Time  4    Period  Weeks    Status  New    Target Date  10/14/18            Plan - 09/27/18 1014    Clinical Impression Statement  Pt came in reporting some increased stiffness in Lt LE since yesterday so began with P/ROM stretching to her Lt hip and then continued with NuStep and hip strength/balance in // bars. Pt reported her Lt hip felt looser after stretching and NuStep but then she started having mild pain with Lt SLS activities in // bars so limited those today due to this. She repotrs not having any  increased pain at end of session.  She was much improved from last week with stability exercises today, especially with high knee marching.     Rehab Potential  Good    Clinical Impairments Affecting Rehab Potential  bony mets, hx of radiation    PT Frequency  2x / week    PT Duration  4 weeks    PT Treatment/Interventions  ADLs/Self Care Home Management;Therapeutic activities;Therapeutic exercise;Patient/family education;Neuromuscular re-education;Manual techniques;Taping;Passive range of motion;Balance training    PT Next Visit Plan  be careful of bony mets in left pelvis: Retest sit to stand in 30 sec for goal assess; cont gentle LE strengthening, balance, and NuStep; review and progress HEP prn    Consulted and Agree with Plan of Care  Patient       Patient will benefit from skilled therapeutic intervention in order to improve the following deficits and impairments:  Abnormal gait, Pain, Decreased endurance, Decreased activity tolerance, Decreased strength, Decreased balance  Visit Diagnosis: Muscle weakness (generalized)  Difficulty in walking, not elsewhere classified     Problem List Patient Active Problem List   Diagnosis Date Noted  . Goals of care, counseling/discussion 09/16/2018  . Metastasis to bone ([place]) 08/30/2018  . Metastasis to lung ([place]) 08/30/2018  . Cancer associated pain 08/30/2018  . 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 Other constipation 08/30/2018  . Preventive measure 08/30/2018  . Physical debility 08/30/2018  . 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  . Insomnia 09/07/2017  . T2_NIDDM 01/06/2017  . Genetic testing 01/01/2017  . Family history of cancer   . FIGO stage II endometrial cancer ([place]) 11/17/2016  . Atherosclerosis of abdominal aorta ([place]) 11/17/2016  . Endometrial cancer ([place]) 09/30/2016  . Osteopenia 11/22/2015  . BMI 25.0-25.9,adult 08/24/2015  . Medication management 01/03/2014  . Breast cancer, left breast ([place]) 12/19/2013  . Hypothyroidism    . Hypertension   . Hyperlipidemia   . Vitamin D deficiency   . Iron deficiency anemia   . Malignant neoplasm of upper-outer quadrant of left breast in female, estrogen receptor positive ([place]) 12/05/2012    Otelia Limes, PTA 09/27/2018, 12:10 PM  [place], [place], [address] Phone: [phone]   Fax:  [phone]  Name: TANEISHA FUSON MRN: [id number] Date of Birth: Feb 24, 1941  PHYSICAL THERAPY DISCHARGE SUMMARY  Visits from Start of Care: 4  Current functional level related to goals / functional outcomes: See above   Remaining deficits: See above, goals not met   Education / Equipment: HEP  Plan: Patient agrees to discharge.  Patient goals were not met. Patient is being discharged due to not returning since the last visit.  ?????     Allyson Sabal [place], [place] 11/29/18 1:11 PM

## 2018-09-27 NOTE — Progress Notes (Signed)
Tammy Hensley said she is feeling better and does not have any pain in her left hip. She said she is currently cutting the 4 mg decadron tablet in half and taking one half daily.  Advised her that she can taper off it by taking 1/2 tablet (2mg ) every other day for a week and then stop taking per Dr. Sondra Come. She verbalized agreement and understanding.

## 2018-09-28 ENCOUNTER — Ambulatory Visit: Payer: Medicare Other

## 2018-09-28 ENCOUNTER — Other Ambulatory Visit: Payer: Self-pay | Admitting: Physician Assistant

## 2018-09-29 ENCOUNTER — Encounter (HOSPITAL_COMMUNITY): Payer: Self-pay

## 2018-09-29 ENCOUNTER — Encounter: Payer: Self-pay | Admitting: Radiation Oncology

## 2018-09-29 ENCOUNTER — Ambulatory Visit (HOSPITAL_COMMUNITY)
Admission: RE | Admit: 2018-09-29 | Discharge: 2018-09-29 | Disposition: A | Discharge status: Home / Self Care | Payer: Medicare Other | Source: Ambulatory Visit | Attending: Hematology and Oncology | Admitting: Hematology and Oncology

## 2018-09-29 ENCOUNTER — Ambulatory Visit: Payer: Medicare Other

## 2018-09-29 DIAGNOSIS — F329 Major depressive disorder, single episode, unspecified: Secondary | ICD-10-CM | POA: Insufficient documentation

## 2018-09-29 DIAGNOSIS — I10 Essential (primary) hypertension: Secondary | ICD-10-CM | POA: Diagnosis not present

## 2018-09-29 DIAGNOSIS — E785 Hyperlipidemia, unspecified: Secondary | ICD-10-CM | POA: Diagnosis not present

## 2018-09-29 DIAGNOSIS — C541 Malignant neoplasm of endometrium: Secondary | ICD-10-CM | POA: Insufficient documentation

## 2018-09-29 DIAGNOSIS — Z7982 Long term (current) use of aspirin: Secondary | ICD-10-CM | POA: Diagnosis not present

## 2018-09-29 DIAGNOSIS — M899 Disorder of bone, unspecified: Secondary | ICD-10-CM | POA: Insufficient documentation

## 2018-09-29 DIAGNOSIS — Z8249 Family history of ischemic heart disease and other diseases of the circulatory system: Secondary | ICD-10-CM | POA: Insufficient documentation

## 2018-09-29 DIAGNOSIS — Z7984 Long term (current) use of oral hypoglycemic drugs: Secondary | ICD-10-CM | POA: Insufficient documentation

## 2018-09-29 DIAGNOSIS — E119 Type 2 diabetes mellitus without complications: Secondary | ICD-10-CM | POA: Diagnosis not present

## 2018-09-29 HISTORY — PX: IR IMAGING GUIDED PORT INSERTION: IMG5740

## 2018-09-29 LAB — PROTIME-INR
INR: 0.97
PROTHROMBIN TIME: 12.8 s (ref 11.4–15.2)

## 2018-09-29 LAB — CBC
HEMATOCRIT: 35.8 % — AB (ref 36.0–46.0)
HEMOGLOBIN: 11 g/dL — AB (ref 12.0–15.0)
MCH: 25.5 pg — ABNORMAL LOW (ref 26.0–34.0)
MCHC: 30.7 g/dL (ref 30.0–36.0)
MCV: 83.1 fL (ref 80.0–100.0)
NRBC: 0 % (ref 0.0–0.2)
Platelets: 372 10*3/uL (ref 150–400)
RBC: 4.31 MIL/uL (ref 3.87–5.11)
RDW: 16.4 % — ABNORMAL HIGH (ref 11.5–15.5)
WBC: 3.9 10*3/uL — AB (ref 4.0–10.5)

## 2018-09-29 LAB — GLUCOSE, CAPILLARY: GLUCOSE-CAPILLARY: 78 mg/dL (ref 70–99)

## 2018-09-29 LAB — APTT: APTT: 24 s (ref 24–36)

## 2018-09-29 MED ORDER — CEFAZOLIN SODIUM-DEXTROSE 2-4 GM/100ML-% IV SOLN
INTRAVENOUS | Status: AC
  Administered 2018-09-29: 2 g via INTRAVENOUS
  Filled 2018-09-29: qty 100

## 2018-09-29 MED ORDER — CEFAZOLIN SODIUM-DEXTROSE 2-4 GM/100ML-% IV SOLN
2.0000 g | Freq: Once | INTRAVENOUS | Status: AC
  Administered 2018-09-29: 2 g via INTRAVENOUS

## 2018-09-29 MED ORDER — MIDAZOLAM HCL 2 MG/2ML IJ SOLN
INTRAMUSCULAR | Status: AC | PRN
  Administered 2018-09-29 (×2): 1 mg via INTRAVENOUS

## 2018-09-29 MED ORDER — MIDAZOLAM HCL 2 MG/2ML IJ SOLN
INTRAMUSCULAR | Status: AC
  Filled 2018-09-29: qty 2

## 2018-09-29 MED ORDER — SODIUM CHLORIDE 0.9 % IV SOLN
INTRAVENOUS | Status: DC
  Administered 2018-09-29: 10:00:00 via INTRAVENOUS

## 2018-09-29 MED ORDER — LIDOCAINE HCL 1 % IJ SOLN
INTRAMUSCULAR | Status: AC
  Filled 2018-09-29: qty 20

## 2018-09-29 MED ORDER — LIDOCAINE HCL (PF) 1 % IJ SOLN
INTRAMUSCULAR | Status: AC | PRN
  Administered 2018-09-29 (×2): 5 mL

## 2018-09-29 MED ORDER — FENTANYL CITRATE (PF) 100 MCG/2ML IJ SOLN
INTRAMUSCULAR | Status: AC | PRN
  Administered 2018-09-29: 50 ug via INTRAVENOUS

## 2018-09-29 MED ORDER — HEPARIN SOD (PORK) LOCK FLUSH 100 UNIT/ML IV SOLN
INTRAVENOUS | Status: AC
  Filled 2018-09-29: qty 5

## 2018-09-29 MED ORDER — FENTANYL CITRATE (PF) 100 MCG/2ML IJ SOLN
INTRAMUSCULAR | Status: AC
  Filled 2018-09-29: qty 2

## 2018-09-29 MED ORDER — HEPARIN SOD (PORK) LOCK FLUSH 100 UNIT/ML IV SOLN
INTRAVENOUS | Status: AC | PRN
  Administered 2018-09-29: 500 [IU] via INTRAVENOUS

## 2018-09-29 NOTE — Progress Notes (Signed)
  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  Towards the beginning of treatment, she reported burning around her rectum, nocturia x1, and required use of a cane or walker to ambulate. She noted her pain greatly improved throughout treatment. By the end of treatments, she denied any issues and was able to ambulate freely without assistance.   Plan: The patient has completed radiation treatment. The patient will return to radiation oncology clinic for routine followup in one month. I advised them to call or return sooner if they have any questions or concerns related to their recovery or treatment.  -----------------------------------  Blair Promise, PhD, MD  This document serves as a record of services personally performed by [name], MD. It was created on his behalf by Wilburn Mylar, a trained medical scribe. The creation of this record is based on the scribe's personal observations and the provider's statements to them. This document has been checked and approved by the attending provider.

## 2018-09-29 NOTE — Procedures (Signed)
Interventional Radiology Procedure Note  Procedure: Single Lumen Power Port Placement    Access:  Right IJ vein.  Findings: Catheter tip positioned at SVC/RA junction. Port is ready for immediate use.   Complications: None  EBL: < 10 mL  Recommendations:  - Ok to shower in 24 hours - Do not submerge for 7 days - Routine line care   Gilmer Kaminsky T. Maksim Peregoy, M.D Pager:  [phone]   

## 2018-09-29 NOTE — Discharge Instructions (Signed)
Moderate Conscious Sedation, Adult, Care After °These instructions provide you with information about caring for yourself after your procedure. Your health care provider may also give you more specific instructions. Your treatment has been planned according to current medical practices, but problems sometimes occur. Call your health care provider if you have any problems or questions after your procedure. °What can I expect after the procedure? °After your procedure, it is common: °· To feel sleepy for several hours. °· To feel clumsy and have poor balance for several hours. °· To have poor judgment for several hours. °· To vomit if you eat too soon. ° °Follow these instructions at home: °For at least 24 hours after the procedure: ° °· Do not: °? Participate in activities where you could fall or become injured. °? Drive. °? Use heavy machinery. °? Drink alcohol. °? Take sleeping pills or medicines that cause drowsiness. °? Make important decisions or sign legal documents. °? Take care of children on your own. °· Rest. °Eating and drinking °· Follow the diet recommended by your health care provider. °· If you vomit: °? Drink water, juice, or soup when you can drink without vomiting. °? Make sure you have little or no nausea before eating solid foods. °General instructions °· Have a responsible adult stay with you until you are awake and alert. °· Take over-the-counter and prescription medicines only as told by your health care provider. °· If you smoke, do not smoke without supervision. °· Keep all follow-up visits as told by your health care provider. This is important. °Contact a health care provider if: °· You keep feeling nauseous or you keep vomiting. °· You feel light-headed. °· You develop a rash. °· You have a fever. °Get help right away if: °· You have trouble breathing. °This information is not intended to replace advice given to you by your health care provider. Make sure you discuss any questions you have  with your health care provider. °Document Released: 08/30/2013 Document Revised: 04/13/2016 Document Reviewed: 02/29/2016 °Elsevier Interactive Patient Education © 2018 [employer] ° ° °Implanted Port Insertion, Care After °This sheet gives you information about how to care for yourself after your procedure. Your health care provider may also give you more specific instructions. If you have problems or questions, contact your health care provider. °What can I expect after the procedure? °After your procedure, it is common to have: °· Discomfort at the port insertion site. °· Bruising on the skin over the port. This should improve over 3-4 days. ° °Follow these instructions at home: °Port care °· After your port is placed, you will get a manufacturer's information card. The card has information about your port. Keep this card with you at all times. °· Take care of the port as told by your health care provider. Ask your health care provider if you or a family member can get training for taking care of the port at home. A home health care nurse may also take care of the port. °· Make sure to remember what type of port you have. °Incision care °· Follow instructions from your health care provider about how to take care of your port insertion site. Make sure you: °? Wash your hands with soap and water before you change your bandage (dressing). If soap and water are not available, use hand sanitizer. °? Change your dressing as told by your health care provider.  You may remove your dressing tomorrow. °? Leave stitches (sutures), skin glue, or adhesive strips   in place. These skin closures may need to stay in place for 2 weeks or longer. If adhesive strip edges start to loosen and curl up, you may trim the loose edges. Do not remove adhesive strips completely unless your health care provider tells you to do that.  DO NOT use EMLA cream for 2 weeks after port placement as this cream will remove surgical glue on  insicion.  Check your port insertion site every day for signs of infection. Check for: ? More redness, swelling, or pain. ? More fluid or blood. ? Warmth. ? Pus or a bad smell. General instructions  Do not take baths, swim, or use a hot tub until your health care provider approves.  You may shower tomorrow.  Do not lift anything that is heavier than 10 lb (4.5 kg) for a week, or as told by your health care provider.  Ask your health care provider when it is okay to: ? Return to work or school. ? Resume usual physical activities or sports.  Do not drive for 24 hours if you were given a medicine to help you relax (sedative).  Take over-the-counter and prescription medicines only as told by your health care provider.  Wear a medical alert bracelet in case of an emergency. This will tell any health care providers that you have a port.  Keep all follow-up visits as told by your health care provider. This is important. Contact a health care provider if:  You cannot flush your port with saline as directed, or you cannot draw blood from the port.  You have a fever or chills.  You have more redness, swelling, or pain around your port insertion site.  You have more fluid or blood coming from your port insertion site.  Your port insertion site feels warm to the touch.  You have pus or a bad smell coming from the port insertion site. Get help right away if:  You have chest pain or shortness of breath.  You have bleeding from your port that you cannot control. Summary  Take care of the port as told by your health care provider.  Change your dressing as told by your health care provider.  Keep all follow-up visits as told by your health care provider. This information is not intended to replace advice given to you by your health care provider. Make sure you discuss any questions you have with your health care provider. Document Released: 08/30/2013 Document Revised: 09/30/2016  Document Reviewed: 09/30/2016 Elsevier Interactive Patient Education  2017 [employer].

## 2018-09-29 NOTE — H&P (Signed)
Chief Complaint: Patient was seen in consultation today for port placement  Referring Physician(s): Lewis  Supervising Physician: Aletta Edouard  Patient Status: [facility] - Out-pt  History of Present Illness: Tammy Hensley is a 77 y.o. female with a past medical history of significant for anemia, DM, HTN, HLD, depression, breast cancer and endometrial cancer who presents today for port placement. Patient is followed by Dr. Alvy Bimler with plans to start palliative chemotherapy on Tuesday for recurrent endometrial cancer. She is known to IR due to previous biopsy of left iliac mass on 09/06/18 with Dr. Kathlene Cote.    Patient reports she feels fine, has not had any pain after palliative radiation therapy. She states she did not have much pain before the discovery of the mass on her iliac bone and does not have any pain now. She states understanding of procedure today and wishes to proceed.  Past Medical History:  Diagnosis Date  . Avascular necrosis of bones of both hips ([place]) 11/17/2016  . Breast cancer ([place]) 2006   left breast  . Depression   . Diabetes ([place])    borderline  . Family history of cancer   . History of brachytherapy 03/29/17-04/12/17   vaginal cuff 18 Gy in 3 fractions  . History of radiation therapy 02/10/17-03/16/17   pelvis 45 Gy in 25 fractions  . Hyperlipidemia   . Hypertension   . Iron deficiency anemia   . Vitamin D deficiency     Past Surgical History:  Procedure Laterality Date  . BREAST LUMPECTOMY Left 2006  . BREAST SURGERY  2006   left breast lumpectomy- radiation, took Femara   . 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 ROBOTIC ASSISTED LAP VAGINAL HYSTERECTOMY N/A 10/13/2016   Procedure: XI ROBOTIC ASSISTED LAPAROSCOPIC VAGINAL HYSTERECTOMY;  Surgeon: Nancy Marus, MD;  Location: [facility] ORS;  Service: Gynecology;  Laterality: N/A;  . ROBOTIC ASSISTED SALPINGO OOPHERECTOMY  Bilateral 10/13/2016   Procedure: XI ROBOTIC ASSISTED SALPINGO OOPHORECTOMY;  Surgeon: Nancy Marus, MD;  Location: [facility] ORS;  Service: Gynecology;  Laterality: Bilateral;  . SENTINEL NODE BIOPSY N/A 10/13/2016   Procedure: SENTINEL NODE BIOPSY;  Surgeon: Nancy Marus, MD;  Location: [facility] ORS;  Service: Gynecology;  Laterality: N/A;    Allergies: Calan [verapamil]; Effexor [venlafaxine]; Erythromycin; Femara [letrozole]; Fosamax [alendronate]; Ibuprofen; Paxil [paroxetine hcl]; Remeron [mirtazapine]; Tamoxifen; and Vasotec [enalapril]  Medications: Prior to Admission medications   Medication Sig Start Date End Date Taking? Authorizing Provider  Calcium Carbonate-Vitamin D (CALCIUM 600 + D PO) Take 1 tablet by mouth 2 (two) times daily.    Yes [provider]  Cholecalciferol (VITAMIN D) 2000 units CAPS Take 2,000 Units by mouth every other day.   Yes [provider]  gabapentin (NEURONTIN) 300 MG capsule Take 1 capsule (300 mg total) by mouth 2 (two) times daily. 09/08/18  Yes Gorsuch, Ni, MD  levothyroxine (SYNTHROID, LEVOTHROID) 88 MCG tablet Take 1 tablet (88 mcg total) by mouth daily before breakfast. Patient taking differently: Take 44 mcg by mouth 4 (four) times a week. Take 0.5 tablet (44 mcg) by mouth on Mondays, Wednesdays, Fridays, & Saturdays in the morning on an empty stomach. 05/04/17  Yes [name], MD  loratadine (CLARITIN) 10 MG tablet Take 10 mg by mouth daily as needed for allergies.   Yes [provider]  loratadine-pseudoephedrine (CLARITIN-D 24 HOUR) 10-240 MG 24 hr tablet Take 1 tablet by mouth  daily as needed for allergies.   Yes [provider]  Multiple Vitamin (MULTIVITAMIN WITH MINERALS) TABS tablet Take 1 tablet by mouth daily.   Yes [provider]  traMADol (ULTRAM) 50 MG tablet Take 1 tablet (50 mg total) by mouth 3 (three) times daily. Take 1/2 to 1 tablet every 4 hours as needed for severe Back Pain Patient taking  differently: Take 25-50 mg by mouth every 4 (four) hours as needed (for pain.). Take 1/2 to 1 tablet every 4 hours as needed for severe Back Pain 08/31/18  Yes [name], MD  acetaminophen (TYLENOL) 500 MG tablet Take 500 mg by mouth daily as needed for moderate pain or headache.    [provider]  aspirin 81 MG tablet Take 81 mg by mouth daily.      [provider]  dexamethasone (DECADRON) 4 MG tablet Take 3 tabs at the night before and 3 tab the morning of chemotherapy, every 3 weeks, by mouth 09/15/18   [name], MD  lidocaine-prilocaine (EMLA) cream Apply to affected area once 09/15/18   [name], MD  metFORMIN (GLUCOPHAGE-XR) 500 MG 24 hr tablet TAKE ONE TABLET BY MOUTH WITH BREAKFAST AND LUNCH AND TAKE TWO TABLETS BY MOUTH WITH SUPPER FOR DAIBETES. Patient taking differently: Take 500-1,000 mg by mouth See admin instructions. Take 1 tablet (500 mg) by mouth daily before breakfast, 1 tablet (500 mg) by mouth daily before lunch, & take 2 tablets (1000 mg) by mouth daily before supper. 03/16/18   [name], MD  ondansetron (ZOFRAN) 8 MG tablet Take 1 tablet (8 mg total) by mouth every 8 (eight) hours as needed for refractory nausea / vomiting. Start on day 3 after chemo. 09/15/18   [name], MD  pravastatin (PRAVACHOL) 40 MG tablet Take 1 tablet (40 mg total) by mouth every evening. Patient taking differently: Take 20 mg by mouth every evening.  03/02/18   [name], MD  prochlorperazine (COMPAZINE) 10 MG tablet Take 1 tablet (10 mg total) by mouth every 6 (six) hours as needed (Nausea or vomiting). 09/15/18   [name], MD     Family History  Problem Relation Age of Onset  . Heart disease Mother   . Heart attack Mother   . Cancer Father   . Heart attack Father   . Prostate cancer Father 45       deceased 52  . Breast cancer Paternal Aunt 19       deceased 30s  . Prostate cancer Paternal Uncle 40       deceased 10  . Cancer Paternal Grandmother         unsure; deceased 4s  . Prostate cancer Brother 61       currently 34  . Prostate cancer Brother 17       currently 63    Social History   Socioeconomic History  . Marital status: Married    Spouse name: Not on file  . Number of children: 2  . Years of education: Not on file  . Highest education level: Not on file  Occupational History  . Not on file  Social Needs  . Financial resource strain: Not on file  . Food insecurity:    Worry: Not on file    Inability: Not on file  . Transportation needs:    Medical: Not on file    Non-medical: Not on file  Tobacco Use  . Smoking status: Never Smoker  . Smokeless tobacco: Never Used  Substance and Sexual Activity  . Alcohol use: No  . Drug use: No  . Sexual activity: Not on file  Lifestyle  . Physical activity:    Days per week: Not on file    Minutes per session: Not on file  . Stress: Not on file  Relationships  . Social connections:    Talks on phone: Not on file    Gets together: Not on file    Attends religious service: Not on file    Active member of club or organization: Not on file    Attends meetings of clubs or organizations: Not on file    Relationship status: Not on file  Other Topics Concern  . Not on file  Social History Narrative  . Not on file     Review of Systems: A 12 point ROS discussed and pertinent positives are indicated in the HPI above.  All other systems are negative.  Review of Systems  Constitutional: Negative for activity change, appetite change, chills and fever.  Respiratory: Negative for cough and shortness of breath.   Cardiovascular: Negative for chest pain.  Gastrointestinal: Negative for abdominal distention, abdominal pain, diarrhea, nausea and vomiting.  Skin: Negative for rash.  Neurological: Negative for dizziness and syncope.  Psychiatric/Behavioral: Negative for confusion.    Vital Signs: BP (!) 151/86   Pulse 76   Temp 98.2 F (36.8 C) (Oral)   Resp 16    SpO2 100%   Physical Exam  Constitutional: She is oriented to person, place, and time. No distress.  Cardiovascular: Normal rate, regular rhythm and normal heart sounds.  Pulmonary/Chest: Effort normal and breath sounds normal.  Abdominal: Soft. Bowel sounds are normal. She exhibits no distension. There is no tenderness.  Neurological: She is alert and oriented to person, place, and time.  Skin: Skin is warm and dry. She is not diaphoretic.  Psychiatric: She has a normal mood and affect. Her behavior is normal. Judgment and thought content normal.  Vitals reviewed.    MD Evaluation Airway: WNL Heart: WNL Abdomen: WNL Chest/ Lungs: WNL ASA  Classification: 3 Mallampati/Airway Score: One   Imaging: Ct Biopsy  Result Date: 09/06/2018 CLINICAL DATA:  Remote history of breast carcinoma and more recent history of endometrial carcinoma. Presenting with large destructive soft tissue mass of the left iliac fossa causing bony destruction of the left iliac bone. EXAM: CT GUIDED CORE BIOPSY OF LEFT ILIAC BONE MASS ANESTHESIA/SEDATION: 1.0 mg IV Versed; 50 mcg IV Fentanyl Total Moderate Sedation Time:  15 minutes. The patient's level of consciousness and physiologic status were continuously monitored during the procedure by Radiology nursing. PROCEDURE: The procedure risks, benefits, and alternatives were explained to the patient. Questions regarding the procedure were encouraged and answered. The patient understands and consents to the procedure. A time-out was performed prior to initiating the procedure. The patient was placed in a prone position and initial CT the pelvis performed in a prone position. The left gluteal region was prepped with chlorhexidine in a sterile fashion, and a sterile drape was applied covering the operative field. A sterile gown and sterile gloves were used for the procedure. Local anesthesia was provided with 1% Lidocaine. Under CT guidance, a 17 gauge trocar needle was  advanced into a soft tissue mass in the left iliac fossa centered in the left iliac bone. Four separate 18 gauge core biopsy samples were obtained and submitted in formalin. COMPLICATIONS: None FINDINGS: Huge destructive soft tissue mass of the left iliac fossa measures approximately  11.3 x 7.8 cm in greatest transverse dimensions and is causing destruction of the left iliac crest and superior left iliac bone, extending into the superior aspect of the left SI joint. Soft tissue mass also extends into the superior aspect of the left gluteal musculature. Solid tissue was obtained from the mass. IMPRESSION: CT-guided core biopsy performed of huge mass in the left iliac fossa destroying the left iliac bone. Electronically Signed   By: Aletta Edouard M.D.   On: 09/06/2018 11:03    Labs:  CBC: Recent Labs    07/05/18 1009 08/29/18 0926 09/06/18 0646 09/29/18 1011  WBC 4.3 4.3 8.0 3.9*  HGB 13.1 13.2 12.2 11.0*  HCT 40.2 39.9 37.8 35.8*  PLT 384 290 463* 372    COAGS: Recent Labs    09/06/18 0646  INR 0.99  APTT 23*    BMP: Recent Labs    03/22/18 1020 05/31/18 0906 07/05/18 1009 08/29/18 0926  NA 143 139 137 137  K 4.6 3.6 3.7 3.9  CL 101 100 96* 99  CO2 30 30 28 25   GLUCOSE 81 89 86 102*  BUN 10 10 9 10   CALCIUM 10.0 10.3 9.9 10.4*  CREATININE 0.65 0.79 0.63 0.69  GFRNONAA 86 >60 87 >60  GFRAA 100 >60 100 >60    LIVER FUNCTION TESTS: Recent Labs    03/22/18 1020 05/31/18 0906 07/05/18 1009 08/29/18 0926  BILITOT 0.6 0.4 0.5 0.5  AST 15 16 13 18   ALT 10 14 9 10   ALKPHOS  --  76  --  76  PROT 6.6 7.5 7.1 7.3  ALBUMIN  --  4.3  --  3.7    TUMOR MARKERS: No results for input(s): AFPTM, CEA, CA199, CHROMGRNA in the last 8760 hours.  Assessment and Plan:  Patient with history of breast cancer and recurrent endometrial cancer followed by Dr. Alvy Bimler who presents today for port placement to begin palliative chemotherapy on Tuesday. She was previously seen in IR  for biopsy of left iliac mass on 09/06/18 with Dr. Kathlene Cote. Pathology of this lesion was unfortunately consistent with malignancy and she has chosen to peruse palliative chemotherapy. She understands indication for procedure today and wishes to proceed.  Last PO intake was 11:30 pm yesterday, she does not take blood thinning medications. She is afebrile, WBC 3.9, H/H 11.0/35.8, INR 0.99.  Risks and benefits of image guided port-a-catheter placement was discussed with the patient including, but not limited to bleeding, infection, pneumothorax, or fibrin sheath development and need for additional procedures.  All of the patient's questions were answered, patient is agreeable to proceed.  Consent signed and in chart.  Thank you for this interesting consult.  I greatly enjoyed meeting Tammy Hensley and look forward to participating in their care.  A copy of this report was sent to the requesting provider on this date.  Electronically Signed: Joaquim Nam, PA-C 09/29/2018, 10:15 AM   I spent a total of   15 Minutes in face to face in clinical consultation, greater than 50% of which was counseling/coordinating care for port placement.

## 2018-09-30 ENCOUNTER — Encounter: Payer: Self-pay | Admitting: Pharmacist

## 2018-09-30 ENCOUNTER — Ambulatory Visit: Payer: Medicare Other | Admitting: Rehabilitation

## 2018-09-30 ENCOUNTER — Ambulatory Visit: Payer: Medicare Other

## 2018-10-03 ENCOUNTER — Inpatient Hospital Stay: Payer: Medicare Other | Attending: Hematology and Oncology

## 2018-10-03 ENCOUNTER — Ambulatory Visit: Payer: Medicare Other

## 2018-10-03 ENCOUNTER — Inpatient Hospital Stay: Payer: Medicare Other

## 2018-10-03 DIAGNOSIS — Z5111 Encounter for antineoplastic chemotherapy: Secondary | ICD-10-CM | POA: Insufficient documentation

## 2018-10-03 DIAGNOSIS — I10 Essential (primary) hypertension: Secondary | ICD-10-CM | POA: Diagnosis not present

## 2018-10-03 DIAGNOSIS — R51 Headache: Secondary | ICD-10-CM | POA: Insufficient documentation

## 2018-10-03 DIAGNOSIS — C541 Malignant neoplasm of endometrium: Secondary | ICD-10-CM

## 2018-10-03 DIAGNOSIS — R600 Localized edema: Secondary | ICD-10-CM | POA: Insufficient documentation

## 2018-10-03 DIAGNOSIS — C7951 Secondary malignant neoplasm of bone: Secondary | ICD-10-CM

## 2018-10-03 DIAGNOSIS — C7801 Secondary malignant neoplasm of right lung: Secondary | ICD-10-CM

## 2018-10-03 LAB — CBC WITH DIFFERENTIAL (CANCER CENTER ONLY)
ABS IMMATURE GRANULOCYTES: 0.03 10*3/uL (ref 0.00–0.07)
BASOS PCT: 1 %
Basophils Absolute: 0 10*3/uL (ref 0.0–0.1)
Eosinophils Absolute: 0 10*3/uL (ref 0.0–0.5)
Eosinophils Relative: 0 %
HCT: 35.5 % — ABNORMAL LOW (ref 36.0–46.0)
Hemoglobin: 11.1 g/dL — ABNORMAL LOW (ref 12.0–15.0)
IMMATURE GRANULOCYTES: 1 %
Lymphocytes Relative: 9 %
Lymphs Abs: 0.4 10*3/uL — ABNORMAL LOW (ref 0.7–4.0)
MCH: 25.1 pg — ABNORMAL LOW (ref 26.0–34.0)
MCHC: 31.3 g/dL (ref 30.0–36.0)
MCV: 80.3 fL (ref 80.0–100.0)
MONOS PCT: 9 %
Monocytes Absolute: 0.3 10*3/uL (ref 0.1–1.0)
NEUTROS PCT: 80 %
NRBC: 0 % (ref 0.0–0.2)
Neutro Abs: 3.2 10*3/uL (ref 1.7–7.7)
PLATELETS: 356 10*3/uL (ref 150–400)
RBC: 4.42 MIL/uL (ref 3.87–5.11)
RDW: 16.2 % — ABNORMAL HIGH (ref 11.5–15.5)
WBC Count: 4 10*3/uL (ref 4.0–10.5)

## 2018-10-03 LAB — CMP (CANCER CENTER ONLY)
ALT: 15 U/L (ref 0–44)
AST: 15 U/L (ref 15–41)
Albumin: 2.8 g/dL — ABNORMAL LOW (ref 3.5–5.0)
Alkaline Phosphatase: 92 U/L (ref 38–126)
Anion gap: 10 (ref 5–15)
BUN: 10 mg/dL (ref 8–23)
CHLORIDE: 101 mmol/L (ref 98–111)
CO2: 28 mmol/L (ref 22–32)
Calcium: 9.4 mg/dL (ref 8.9–10.3)
Creatinine: 0.71 mg/dL (ref 0.44–1.00)
GFR, Est AFR Am: >60 mL/min (ref >60)
Glucose, Bld: 144 mg/dL — ABNORMAL HIGH (ref 70–99)
POTASSIUM: 3.6 mmol/L (ref 3.5–5.1)
SODIUM: 139 mmol/L (ref 135–145)
Total Bilirubin: 0.4 mg/dL (ref 0.3–1.2)
Total Protein: 6.4 g/dL — ABNORMAL LOW (ref 6.5–8.1)

## 2018-10-03 LAB — URIC ACID: Uric Acid, Serum: 4.2 mg/dL (ref 2.5–7.1)

## 2018-10-03 LAB — MAGNESIUM: Magnesium: 1.8 mg/dL (ref 1.7–2.4)

## 2018-10-03 MED ORDER — HEPARIN SOD (PORK) LOCK FLUSH 100 UNIT/ML IV SOLN
500.0000 [IU] | Freq: Once | INTRAVENOUS | Status: AC
  Administered 2018-10-03: 500 [IU]
  Filled 2018-10-03: qty 5

## 2018-10-03 MED ORDER — SODIUM CHLORIDE 0.9% FLUSH
10.0000 mL | Freq: Once | INTRAVENOUS | Status: AC
  Administered 2018-10-03: 10 mL
  Filled 2018-10-03: qty 10

## 2018-10-04 ENCOUNTER — Telehealth: Payer: Self-pay | Admitting: Hematology and Oncology

## 2018-10-04 ENCOUNTER — Other Ambulatory Visit: Payer: Self-pay | Admitting: Medical

## 2018-10-04 ENCOUNTER — Ambulatory Visit (HOSPITAL_COMMUNITY)
Admission: RE | Admit: 2018-10-04 | Discharge: 2018-10-04 | Disposition: A | Discharge status: Home / Self Care | Payer: Medicare Other | Source: Ambulatory Visit | Attending: Medical | Admitting: Medical

## 2018-10-04 ENCOUNTER — Inpatient Hospital Stay (HOSPITAL_BASED_OUTPATIENT_CLINIC_OR_DEPARTMENT_OTHER): Payer: Medicare Other | Admitting: Medical

## 2018-10-04 ENCOUNTER — Inpatient Hospital Stay: Payer: Medicare Other

## 2018-10-04 VITALS — BP 155/85 | HR 90 | Temp 97.8°F | Resp 17

## 2018-10-04 DIAGNOSIS — M25561 Pain in right knee: Secondary | ICD-10-CM | POA: Diagnosis present

## 2018-10-04 DIAGNOSIS — W19XXXA Unspecified fall, initial encounter: Secondary | ICD-10-CM | POA: Insufficient documentation

## 2018-10-04 DIAGNOSIS — M1711 Unilateral primary osteoarthritis, right knee: Secondary | ICD-10-CM | POA: Insufficient documentation

## 2018-10-04 DIAGNOSIS — C541 Malignant neoplasm of endometrium: Secondary | ICD-10-CM

## 2018-10-04 DIAGNOSIS — Z9181 History of falling: Secondary | ICD-10-CM

## 2018-10-04 DIAGNOSIS — Z5111 Encounter for antineoplastic chemotherapy: Secondary | ICD-10-CM | POA: Diagnosis not present

## 2018-10-04 DIAGNOSIS — C7951 Secondary malignant neoplasm of bone: Secondary | ICD-10-CM

## 2018-10-04 DIAGNOSIS — T148XXA Other injury of unspecified body region, initial encounter: Secondary | ICD-10-CM

## 2018-10-04 LAB — CA 125: CANCER ANTIGEN (CA) 125: 8 U/mL (ref 0.0–38.1)

## 2018-10-04 MED ORDER — SODIUM CHLORIDE 0.9 % IV SOLN
Freq: Once | INTRAVENOUS | Status: AC
  Administered 2018-10-04: 09:00:00 via INTRAVENOUS
  Filled 2018-10-04: qty 250

## 2018-10-04 MED ORDER — SODIUM CHLORIDE 0.9 % IV SOLN
Freq: Once | INTRAVENOUS | Status: AC
  Administered 2018-10-04: 10:00:00 via INTRAVENOUS
  Filled 2018-10-04: qty 5

## 2018-10-04 MED ORDER — SODIUM CHLORIDE 0.9 % IV SOLN
131.2500 mg/m2 | Freq: Once | INTRAVENOUS | Status: AC
  Administered 2018-10-04: 222 mg via INTRAVENOUS
  Filled 2018-10-04: qty 37

## 2018-10-04 MED ORDER — PALONOSETRON HCL INJECTION 0.25 MG/5ML
INTRAVENOUS | Status: AC
  Filled 2018-10-04: qty 5

## 2018-10-04 MED ORDER — SODIUM CHLORIDE 0.9 % IV SOLN
50.0000 mg | Freq: Once | INTRAVENOUS | Status: AC
  Administered 2018-10-04: 25 mg via INTRAVENOUS
  Filled 2018-10-04: qty 1

## 2018-10-04 MED ORDER — FAMOTIDINE IN NACL 20-0.9 MG/50ML-% IV SOLN: 20.0000 mg | Freq: Once | INTRAVENOUS | Status: DC

## 2018-10-04 MED ORDER — SODIUM CHLORIDE 0.9% FLUSH
10.0000 mL | INTRAVENOUS | Status: DC | PRN
  Administered 2018-10-04: 10 mL
  Filled 2018-10-04: qty 10

## 2018-10-04 MED ORDER — SODIUM CHLORIDE 0.9 % IV SOLN
20.0000 mg | Freq: Once | INTRAVENOUS | Status: AC
  Administered 2018-10-04: 20 mg via INTRAVENOUS
  Filled 2018-10-04: qty 2

## 2018-10-04 MED ORDER — DIPHENHYDRAMINE HCL 50 MG/ML IJ SOLN
INTRAMUSCULAR | Status: AC
  Filled 2018-10-04: qty 1

## 2018-10-04 MED ORDER — SODIUM CHLORIDE 0.9 % IV SOLN
20.0000 mg | Freq: Once | INTRAVENOUS | Status: DC
  Filled 2018-10-04: qty 2

## 2018-10-04 MED ORDER — SODIUM CHLORIDE 0.9 % IV SOLN
50.0000 mg | Freq: Once | INTRAVENOUS | Status: DC
  Filled 2018-10-04: qty 1

## 2018-10-04 MED ORDER — SODIUM CHLORIDE 0.9 % IV SOLN
364.5000 mg | Freq: Once | INTRAVENOUS | Status: AC
  Administered 2018-10-04: 360 mg via INTRAVENOUS
  Filled 2018-10-04: qty 36

## 2018-10-04 MED ORDER — HEPARIN SOD (PORK) LOCK FLUSH 100 UNIT/ML IV SOLN
500.0000 [IU] | Freq: Once | INTRAVENOUS | Status: AC | PRN
  Administered 2018-10-04: 500 [IU]
  Filled 2018-10-04: qty 5

## 2018-10-04 MED ORDER — PALONOSETRON HCL INJECTION 0.25 MG/5ML
0.2500 mg | Freq: Once | INTRAVENOUS | Status: AC
  Administered 2018-10-04: 0.25 mg via INTRAVENOUS

## 2018-10-04 NOTE — Patient Instructions (Signed)
[facility] Discharge Instructions for Patients Receiving Chemotherapy  Today you received the following chemotherapy agents: paclitaxel (Taxol) and carboplatin (Paraplatin).    To help prevent nausea and vomiting after your treatment, we encourage you to take your nausea medication as directed.    If you develop nausea and vomiting that is not controlled by your nausea medication, call the clinic.   BELOW ARE SYMPTOMS THAT SHOULD BE REPORTED IMMEDIATELY:  *FEVER GREATER THAN 100.5 F  *CHILLS WITH OR WITHOUT FEVER  NAUSEA AND VOMITING THAT IS NOT CONTROLLED WITH YOUR NAUSEA MEDICATION  *UNUSUAL SHORTNESS OF BREATH  *UNUSUAL BRUISING OR BLEEDING  TENDERNESS IN MOUTH AND THROAT WITH OR WITHOUT PRESENCE OF ULCERS  *URINARY PROBLEMS  *BOWEL PROBLEMS  UNUSUAL RASH Items with * indicate a potential emergency and should be followed up as soon as possible.  Feel free to call the clinic you have any questions or concerns. The clinic phone number is [phone].  Please show the [facility] at check-in to the Emergency Department and triage nurse.  Paclitaxel injection What is this medicine? PACLITAXEL (PAK li TAX el) is a chemotherapy drug. It targets fast dividing cells, like cancer cells, and causes these cells to die. This medicine is used to treat ovarian cancer, breast cancer, and other cancers. This medicine may be used for other purposes; ask your health care provider or pharmacist if you have questions. COMMON BRAND NAME(S): Onxol, Taxol What should I tell my health care provider before I take this medicine? They need to know if you have any of these conditions: -blood disorders -irregular heartbeat -infection (especially a virus infection such as chickenpox, cold sores, or herpes) -liver disease -previous or ongoing radiation therapy -an unusual or allergic reaction to paclitaxel, alcohol, polyoxyethylated castor oil, other chemotherapy agents,  other medicines, foods, dyes, or preservatives -pregnant or trying to get pregnant -breast-feeding How should I use this medicine? This drug is given as an infusion into a vein. It is administered in a hospital or clinic by a specially trained health care professional. Talk to your pediatrician regarding the use of this medicine in children. Special care may be needed. Overdosage: If you think you have taken too much of this medicine contact a poison control center or emergency room at once. NOTE: This medicine is only for you. Do not share this medicine with others. What if I miss a dose? It is important not to miss your dose. Call your doctor or health care professional if you are unable to keep an appointment. What may interact with this medicine? Do not take this medicine with any of the following medications: -disulfiram -metronidazole This medicine may also interact with the following medications: -cyclosporine -diazepam -ketoconazole -medicines to increase blood counts like filgrastim, pegfilgrastim, sargramostim -other chemotherapy drugs like cisplatin, doxorubicin, epirubicin, etoposide, teniposide, vincristine -quinidine -testosterone -vaccines -verapamil Talk to your doctor or health care professional before taking any of these medicines: -acetaminophen -aspirin -ibuprofen -ketoprofen -naproxen This list may not describe all possible interactions. Give your health care provider a list of all the medicines, herbs, non-prescription drugs, or dietary supplements you use. Also tell them if you smoke, drink alcohol, or use illegal drugs. Some items may interact with your medicine. What should I watch for while using this medicine? Your condition will be monitored carefully while you are receiving this medicine. You will need important blood work done while you are taking this medicine. This medicine can cause serious allergic reactions. To reduce your  risk you will need to take  other medicine(s) before treatment with this medicine. If you experience allergic reactions like skin rash, itching or hives, swelling of the face, lips, or tongue, tell your doctor or health care professional right away. In some cases, you may be given additional medicines to help with side effects. Follow all directions for their use. This drug may make you feel generally unwell. This is not uncommon, as chemotherapy can affect healthy cells as well as cancer cells. Report any side effects. Continue your course of treatment even though you feel ill unless your doctor tells you to stop. Call your doctor or health care professional for advice if you get a fever, chills or sore throat, or other symptoms of a cold or flu. Do not treat yourself. This drug decreases your body's ability to fight infections. Try to avoid being around people who are sick. This medicine may increase your risk to bruise or bleed. Call your doctor or health care professional if you notice any unusual bleeding. Be careful brushing and flossing your teeth or using a toothpick because you may get an infection or bleed more easily. If you have any dental work done, tell your dentist you are receiving this medicine. Avoid taking products that contain aspirin, acetaminophen, ibuprofen, naproxen, or ketoprofen unless instructed by your doctor. These medicines may hide a fever. Do not become pregnant while taking this medicine. Women should inform their doctor if they wish to become pregnant or think they might be pregnant. There is a potential for serious side effects to an unborn child. Talk to your health care professional or pharmacist for more information. Do not breast-feed an infant while taking this medicine. Men are advised not to father a child while receiving this medicine. This product may contain alcohol. Ask your pharmacist or healthcare provider if this medicine contains alcohol. Be sure to tell all healthcare providers you  are taking this medicine. Certain medicines, like metronidazole and disulfiram, can cause an unpleasant reaction when taken with alcohol. The reaction includes flushing, headache, nausea, vomiting, sweating, and increased thirst. The reaction can last from 30 minutes to several hours. What side effects may I notice from receiving this medicine? Side effects that you should report to your doctor or health care professional as soon as possible: -allergic reactions like skin rash, itching or hives, swelling of the face, lips, or tongue -low blood counts - This drug may decrease the number of white blood cells, red blood cells and platelets. You may be at increased risk for infections and bleeding. -signs of infection - fever or chills, cough, sore throat, pain or difficulty passing urine -signs of decreased platelets or bleeding - bruising, pinpoint red spots on the skin, black, tarry stools, nosebleeds -signs of decreased red blood cells - unusually weak or tired, fainting spells, lightheadedness -breathing problems -chest pain -high or low blood pressure -mouth sores -nausea and vomiting -pain, swelling, redness or irritation at the injection site -pain, tingling, numbness in the hands or feet -slow or irregular heartbeat -swelling of the ankle, feet, hands Side effects that usually do not require medical attention (report to your doctor or health care professional if they continue or are bothersome): -bone pain -complete hair loss including hair on your head, underarms, pubic hair, eyebrows, and eyelashes -changes in the color of fingernails -diarrhea -loosening of the fingernails -loss of appetite -muscle or joint pain -red flush to skin -sweating This list may not describe all possible side effects. Call your  doctor for medical advice about side effects. You may report side effects to FDA at 1-800-FDA-1088. Where should I keep my medicine? This drug is given in a hospital or clinic and  will not be stored at home. NOTE: This sheet is a summary. It may not cover all possible information. If you have questions about this medicine, talk to your doctor, pharmacist, or health care provider.  2018 Elsevier/Gold Standard (2015-09-10 19:58:00)  Carboplatin injection What is this medicine? CARBOPLATIN (KAR boe pla tin) is a chemotherapy drug. It targets fast dividing cells, like cancer cells, and causes these cells to die. This medicine is used to treat ovarian cancer and many other cancers. This medicine may be used for other purposes; ask your health care provider or pharmacist if you have questions. COMMON BRAND NAME(S): Paraplatin What should I tell my health care provider before I take this medicine? They need to know if you have any of these conditions: -blood disorders -hearing problems -kidney disease -recent or ongoing radiation therapy -an unusual or allergic reaction to carboplatin, cisplatin, other chemotherapy, other medicines, foods, dyes, or preservatives -pregnant or trying to get pregnant -breast-feeding How should I use this medicine? This drug is usually given as an infusion into a vein. It is administered in a hospital or clinic by a specially trained health care professional. Talk to your pediatrician regarding the use of this medicine in children. Special care may be needed. Overdosage: If you think you have taken too much of this medicine contact a poison control center or emergency room at once. NOTE: This medicine is only for you. Do not share this medicine with others. What if I miss a dose? It is important not to miss a dose. Call your doctor or health care professional if you are unable to keep an appointment. What may interact with this medicine? -medicines for seizures -medicines to increase blood counts like filgrastim, pegfilgrastim, sargramostim -some antibiotics like amikacin, gentamicin, neomycin, streptomycin, tobramycin -vaccines Talk to  your doctor or health care professional before taking any of these medicines: -acetaminophen -aspirin -ibuprofen -ketoprofen -naproxen This list may not describe all possible interactions. Give your health care provider a list of all the medicines, herbs, non-prescription drugs, or dietary supplements you use. Also tell them if you smoke, drink alcohol, or use illegal drugs. Some items may interact with your medicine. What should I watch for while using this medicine? Your condition will be monitored carefully while you are receiving this medicine. You will need important blood work done while you are taking this medicine. This drug may make you feel generally unwell. This is not uncommon, as chemotherapy can affect healthy cells as well as cancer cells. Report any side effects. Continue your course of treatment even though you feel ill unless your doctor tells you to stop. In some cases, you may be given additional medicines to help with side effects. Follow all directions for their use. Call your doctor or health care professional for advice if you get a fever, chills or sore throat, or other symptoms of a cold or flu. Do not treat yourself. This drug decreases your body's ability to fight infections. Try to avoid being around people who are sick. This medicine may increase your risk to bruise or bleed. Call your doctor or health care professional if you notice any unusual bleeding. Be careful brushing and flossing your teeth or using a toothpick because you may get an infection or bleed more easily. If you have any dental work  done, tell your dentist you are receiving this medicine. Avoid taking products that contain aspirin, acetaminophen, ibuprofen, naproxen, or ketoprofen unless instructed by your doctor. These medicines may hide a fever. Do not become pregnant while taking this medicine. Women should inform their doctor if they wish to become pregnant or think they might be pregnant. There is a  potential for serious side effects to an unborn child. Talk to your health care professional or pharmacist for more information. Do not breast-feed an infant while taking this medicine. What side effects may I notice from receiving this medicine? Side effects that you should report to your doctor or health care professional as soon as possible: -allergic reactions like skin rash, itching or hives, swelling of the face, lips, or tongue -signs of infection - fever or chills, cough, sore throat, pain or difficulty passing urine -signs of decreased platelets or bleeding - bruising, pinpoint red spots on the skin, black, tarry stools, nosebleeds -signs of decreased red blood cells - unusually weak or tired, fainting spells, lightheadedness -breathing problems -changes in hearing -changes in vision -chest pain -high blood pressure -low blood counts - This drug may decrease the number of white blood cells, red blood cells and platelets. You may be at increased risk for infections and bleeding. -nausea and vomiting -pain, swelling, redness or irritation at the injection site -pain, tingling, numbness in the hands or feet -problems with balance, talking, walking -trouble passing urine or change in the amount of urine Side effects that usually do not require medical attention (report to your doctor or health care professional if they continue or are bothersome): -hair loss -loss of appetite -metallic taste in the mouth or changes in taste This list may not describe all possible side effects. Call your doctor for medical advice about side effects. You may report side effects to FDA at 1-800-FDA-1088. Where should I keep my medicine? This drug is given in a hospital or clinic and will not be stored at home. NOTE: This sheet is a summary. It may not cover all possible information. If you have questions about this medicine, talk to your doctor, pharmacist, or health care provider.  2018 Elsevier/Gold  Standard (2008-02-14 14:38:05)

## 2018-10-04 NOTE — Progress Notes (Signed)
Pt fell in lobby. Sandi Mealy, PA notified and will be evaluating patient.   Pt seen by Sandi Mealy, PA.  Per Sandi Mealy, PA, ok to start treatment.

## 2018-10-04 NOTE — Progress Notes (Signed)
10/04/18  Spoke with Lucianne Lei PA as patient prior doses of diphenhydramine in the past had been dosed at 25 mg due patient age.  Per Lucianne Lei ok to lower diphenhydramine doses to 25 mg with current therapy plan.  RN Carmell Austria had just hung diphenhydramine bag and upon the call she will stop the infusion at 28 ml which will deliver 25 mg to the patient.  Subsequent treatment plans have been modified to reflect new dose.  Patient prefers diphenhydramine be administered by IV piggyback verses IV push.  T.O.Sandi Mealy, PA/Otila Starn Ronnald Ramp PharmD

## 2018-10-04 NOTE — Telephone Encounter (Signed)
Per 11/12 patient fell this morning and Tammy Hensley asked to put on [place] schedule

## 2018-10-05 NOTE — Progress Notes (Signed)
These results were called to Tammy Hensley and were reviewed with her . Her were answered. She expressed understanding.

## 2018-10-06 ENCOUNTER — Telehealth: Payer: Self-pay | Admitting: Oncology

## 2018-10-06 NOTE — Telephone Encounter (Signed)
Tammy Hensley called and asked if she should restart taking aspirin, vitamin d and calcium.  She said Dr. Alvy Bimler told her to stop taking them when she started chemotherapy.  Advised her to not take them until she can ask Dr. Alvy Bimler at her appointment on 10/25/18.  She also requested a prescription for a wig.  She said Dr. Alvy Bimler had offered her one before and she didn't think she needed one.  Advised her that I will check on this and call her back.

## 2018-10-07 NOTE — Progress Notes (Signed)
[facility] Progress Note   BAILEY FAIELLA [id number] Jun 03, 1941 77 y.o.  Catrena Vari Folkes is managed by Dr. [name]  Actively treated with chemotherapy/immunotherapy: yes  Current Therapy: Carboplatin and paclitaxel  Last Treated: 10/04/2018 (cycle 1, day 1)  Assessment: Plan:    Bruise  Endometrial cancer ([place])   Bruise of the right knee, left hand, and left lateral brow: The patient was referred for an x-ray of her right knee.  She was told to use an ice pack on her right knee and left lateral brow.  Metastatic endometrial cancer: The patient presents for cycle 1, day 1 of carboplatin and paclitaxel.  She is scheduled to return for follow-up on 11/14/2018.  Please see After Visit Summary for patient specific instructions.  Future Appointments  Date Time Provider [place]  10/10/2018  8:45 AM Suanne Marker, PTA [facility] None  10/12/2018 10:15 AM Suanne Marker, PTA [facility] None  10/14/2018  9:15 AM Liane Comber, NP [facility] None  10/25/2018  9:00 AM [facility] LAB 4 [facility] None  10/25/2018  9:15 AM [facility] FLUSH [facility] None  10/25/2018  9:45 AM [name], MD [facility] None  10/25/2018 10:30 AM [facility] INFUSION [facility] None  10/27/2018  4:00 PM [name], MD [facility] None  01/02/2019 10:00 AM [name], MD [facility] None  01/17/2019  9:30 AM [name], MD [facility] None  07/24/2019  9:00 AM [name], MD [facility] None    No orders of the defined types were placed in this encounter.      Subjective:   Patient ID:  KIARA MCDOWELL is a 77 y.o. (DOB 1941-08-01) female.  Chief Complaint: No chief complaint on file.   HPI WARRENE KAPFER is a 77 year old female with a history of a metastatic endometrial cancer who is managed by Dr. [name].  The patient presents today for cycle 1, day 1 of carboplatin and paclitaxel.  She was in the waiting area when she fell.  She  believes that her to was wet and that she slipped on the flooring.  She did state that she caught her shoe on the edge of a table.  She fell onto her left hand, right knee and hit her left lateral brow on a chair she fell.  She did not lose consciousness.  She would like to have her right knee x-rayed as it is tender.  She has had no loss of function.  She is having no visual changes or headaches.  Medications: I have reviewed the patient's current medications.  Allergies:  Allergies  Allergen Reactions  . Calan [Verapamil]     Constipation   . Effexor [Venlafaxine]     unknown  . Erythromycin Nausea And Vomiting  . Femara [Letrozole] Swelling  . Fosamax [Alendronate]     aching  . Ibuprofen     Dizziness, incoherent   . Paxil [Paroxetine Hcl] Nausea Only  . Remeron [Mirtazapine]     Weight gain   . Tamoxifen     Hair loss @ low dose  . Vasotec [Enalapril] Cough    Past Medical History:  Diagnosis Date  . Avascular necrosis of bones of both hips ([place]) 11/17/2016  . Breast cancer ([place]) 2006   left breast  . Depression   . Diabetes ([place])    borderline  . Family history of cancer   . History of brachytherapy 03/29/17-04/12/17   vaginal cuff 18 Gy in 3 fractions  . History of radiation therapy  02/10/17-03/16/17   pelvis 45 Gy in 25 fractions  . Hyperlipidemia   . Hypertension   . Iron deficiency anemia   . Vitamin D deficiency     Past Surgical History:  Procedure Laterality Date  . BREAST LUMPECTOMY Left 2006  . BREAST SURGERY  2006   left breast lumpectomy- radiation, took Femara   . IR IMAGING GUIDED PORT INSERTION  09/29/2018  . 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 ROBOTIC ASSISTED LAP VAGINAL HYSTERECTOMY N/A 10/13/2016   Procedure: XI ROBOTIC ASSISTED LAPAROSCOPIC VAGINAL HYSTERECTOMY;  Surgeon: Nancy Marus, MD;  Location: [facility];  Service: Gynecology;  Laterality: N/A;  . ROBOTIC  ASSISTED SALPINGO OOPHERECTOMY Bilateral 10/13/2016   Procedure: XI ROBOTIC ASSISTED SALPINGO OOPHORECTOMY;  Surgeon: Nancy Marus, MD;  Location: [facility];  Service: Gynecology;  Laterality: Bilateral;  . SENTINEL NODE BIOPSY N/A 10/13/2016   Procedure: SENTINEL NODE BIOPSY;  Surgeon: Nancy Marus, MD;  Location: [facility];  Service: Gynecology;  Laterality: N/A;    Family History  Problem Relation Age of Onset  . Heart disease Mother   . Heart attack Mother   . Cancer Father   . Heart attack Father   . Prostate cancer Father 68       deceased 57  . Breast cancer Paternal Aunt 76       deceased 3s  . Prostate cancer Paternal Uncle 58       deceased 80  . Cancer Paternal Grandmother        unsure; deceased 49s  . Prostate cancer Brother 57       currently 27  . Prostate cancer Brother 7       currently 2    Social History   Socioeconomic History  . Marital status: Married    Spouse name: Not on file  . Number of children: 2  . Years of education: Not on file  . Highest education level: Not on file  Occupational History  . Not on file  Social Needs  . Financial resource strain: Not on file  . Food insecurity:    Worry: Not on file    Inability: Not on file  . Transportation needs:    Medical: Not on file    Non-medical: Not on file  Tobacco Use  . Smoking status: Never Smoker  . Smokeless tobacco: Never Used  Substance and Sexual Activity  . Alcohol use: No  . Drug use: No  . Sexual activity: Not on file  Lifestyle  . Physical activity:    Days per week: Not on file    Minutes per session: Not on file  . Stress: Not on file  Relationships  . Social connections:    Talks on phone: Not on file    Gets together: Not on file    Attends religious service: Not on file    Active member of club or organization: Not on file    Attends meetings of clubs or organizations: Not on file    Relationship status: Not on file  . Intimate partner violence:    Fear of  current or ex partner: Not on file    Emotionally abused: Not on file    Physically abused: Not on file    Forced sexual activity: Not on file  Other Topics Concern  . Not on file  Social History Narrative  . Not on file  Past Medical History, Surgical history, Social history, and Family history were reviewed and updated as appropriate.   Please see review of systems for further details on the patient's review from today.   Review of Systems:  Review of Systems  Eyes: Negative for visual disturbance.  Musculoskeletal: Positive for arthralgias. Negative for myalgias.  Skin: Positive for color change and wound.       Bruising of the left hand, left lateral brow, and right knee.  Neurological: Negative for dizziness, syncope, speech difficulty and headaches.    Objective:   Physical Exam:  There were no vitals taken for this visit. ECOG: 0  Physical Exam  Constitutional: No distress.  HENT:  Head: Normocephalic.    Eyes: Right eye exhibits no discharge. Left eye exhibits no discharge. No scleral icterus.  Musculoskeletal:       Right knee: She exhibits swelling and ecchymosis. She exhibits normal range of motion, no effusion, no deformity, no erythema, no LCL laxity, normal patellar mobility, no bony tenderness and no MCL laxity.  Neurological: She is alert. Coordination normal.  Skin: Skin is warm and dry. She is not diaphoretic.     Psychiatric: She has a normal mood and affect. Her behavior is normal. Judgment and thought content normal.    Lab Review:     Component Value Date/Time   NA 139 10/03/2018 0850   NA 137 05/31/2017 0931   K 3.6 10/03/2018 0850   K 3.5 05/31/2017 0931   CL 101 10/03/2018 0850   CL 99 12/20/2012 1336   CO2 28 10/03/2018 0850   CO2 25 05/31/2017 0931   GLUCOSE 144 (H) 10/03/2018 0850   GLUCOSE 161 (H) 05/31/2017 0931   GLUCOSE 110 (H) 12/20/2012 1336   BUN 10 10/03/2018 0850   BUN 10.9 05/31/2017 0931   CREATININE 0.71 10/03/2018  0850   CREATININE 0.63 07/05/2018 1009   CREATININE 0.8 05/31/2017 0931   CALCIUM 9.4 10/03/2018 0850   CALCIUM 10.6 (H) 05/31/2017 0931   PROT 6.4 (L) 10/03/2018 0850   PROT 7.5 05/31/2017 0931   ALBUMIN 2.8 (L) 10/03/2018 0850   ALBUMIN 4.2 05/31/2017 0931   AST 15 10/03/2018 0850   AST 16 05/31/2017 0931   ALT 15 10/03/2018 0850   ALT 15 05/31/2017 0931   ALKPHOS 92 10/03/2018 0850   ALKPHOS 91 05/31/2017 0931   BILITOT 0.4 10/03/2018 0850   BILITOT 0.33 05/31/2017 0931   GFRNONAA >60 10/03/2018 0850   GFRNONAA 87 07/05/2018 1009   GFRAA >60 10/03/2018 0850   GFRAA 100 07/05/2018 1009       Component Value Date/Time   WBC 4.0 10/03/2018 0850   WBC 3.9 (L) 09/29/2018 1011   RBC 4.42 10/03/2018 0850   HGB 11.1 (L) 10/03/2018 0850   HGB 12.9 05/31/2017 0931   HCT 35.5 (L) 10/03/2018 0850   HCT 38.7 05/31/2017 0931   PLT 356 10/03/2018 0850   PLT 323 05/31/2017 0931   MCV 80.3 10/03/2018 0850   MCV 80.7 05/31/2017 0931   MCH 25.1 (L) 10/03/2018 0850   MCHC 31.3 10/03/2018 0850   RDW 16.2 (H) 10/03/2018 0850   RDW 15.6 (H) 05/31/2017 0931   LYMPHSABS 0.4 (L) 10/03/2018 0850   LYMPHSABS 0.5 (L) 05/31/2017 0931   MONOABS 0.3 10/03/2018 0850   MONOABS 0.1 05/31/2017 0931   EOSABS 0.0 10/03/2018 0850   EOSABS 0.0 05/31/2017 0931   BASOSABS 0.0 10/03/2018 0850   BASOSABS 0.0 05/31/2017 0931   -------------------------------  Imaging  from last 24 hours (if applicable):  Radiology interpretation: Dg Knee 4 Views W/patella Right  Result Date: 10/04/2018 CLINICAL DATA:  [place] today onto right knee while undergoing chemotherapy, endometrial carcinoma, remote breast carcinoma EXAM: RIGHT KNEE - COMPLETE 4+ VIEW COMPARISON:  None. FINDINGS: For age there is only mild tricompartmental degenerative joint disease the right knee with some sclerosis and spurring primarily involving the lateral compartment. No fracture is seen and no joint effusion is noted. IMPRESSION: No acute  abnormality. 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 Electronically Signed   By: Ivar Drape M.D.   On: 10/04/2018 16:02   Ir Imaging Guided Port Insertion  Result Date: 09/29/2018 CLINICAL DATA:  Recurrent metastatic endometrial carcinoma and need for porta cath for chemotherapy. EXAM: IMPLANTED PORT A CATH PLACEMENT WITH ULTRASOUND AND FLUOROSCOPIC GUIDANCE ANESTHESIA/SEDATION: 2.0 mg IV Versed; 50 mcg IV Fentanyl Total Moderate Sedation Time:  38 minutes The patient's level of consciousness and physiologic status were continuously monitored during the procedure by Radiology nursing. Additional Medications: 2 g IV Ancef. FLUOROSCOPY TIME:  18 seconds.  2.3 mGy. PROCEDURE: The procedure, risks, benefits, and alternatives were explained to the patient. Questions regarding the procedure were encouraged and answered. The patient understands and consents to the procedure. A time-out was performed prior to initiating the procedure. Ultrasound was utilized to confirm patency of the right internal jugular vein. The right neck and chest were prepped with chlorhexidine in a sterile fashion, and a sterile drape was applied covering the operative field. Maximum barrier sterile technique with sterile gowns and gloves were used for the procedure. Local anesthesia was provided with 1% lidocaine. After creating a small venotomy incision, a 21 gauge needle was advanced into the right internal jugular vein under direct, real-time ultrasound guidance. Ultrasound image documentation was performed. After securing guidewire access, an 8 Fr dilator was placed. A J-wire was kinked to measure appropriate catheter length. A subcutaneous port pocket was then created along the upper chest wall utilizing sharp and blunt dissection. Portable cautery was utilized. The pocket was irrigated with sterile saline. A single lumen power injectable port was chosen for placement. The 8 Fr catheter was tunneled from the  port pocket site to the venotomy incision. The port was placed in the pocket. External catheter was trimmed to appropriate length based on guidewire measurement. At the venotomy, an 8 Fr peel-away sheath was placed over a guidewire. The catheter was then placed through the sheath and the sheath removed. Final catheter positioning was confirmed and documented with a fluoroscopic spot image. The port was accessed with a needle and aspirated and flushed with heparinized saline. The access needle was removed. The venotomy and port pocket incisions were closed with subcutaneous 3-0 Monocryl and subcuticular 4-0 Vicryl. Dermabond was applied to both incisions. COMPLICATIONS: COMPLICATIONS None FINDINGS: After catheter placement, the tip lies at the cavo-atrial junction. The catheter aspirates normally and is ready for immediate use. IMPRESSION: Placement of single lumen port a cath via right internal jugular vein. The catheter tip lies at the cavo-atrial junction. A power injectable port a cath was placed and is ready for immediate use. Electronically Signed   By: Aletta Edouard M.D.   On: 09/29/2018 12:58

## 2018-10-10 ENCOUNTER — Ambulatory Visit: Payer: Medicare Other | Admitting: Gynecologic Oncology

## 2018-10-11 ENCOUNTER — Other Ambulatory Visit (HOSPITAL_COMMUNITY)
Admission: RE | Admit: 2018-10-11 | Discharge: 2018-10-11 | Disposition: A | Discharge status: Home / Self Care | Payer: Medicare Other | Source: Ambulatory Visit | Attending: Hematology and Oncology | Admitting: Hematology and Oncology

## 2018-10-11 DIAGNOSIS — C7951 Secondary malignant neoplasm of bone: Secondary | ICD-10-CM | POA: Insufficient documentation

## 2018-10-11 DIAGNOSIS — C541 Malignant neoplasm of endometrium: Secondary | ICD-10-CM | POA: Insufficient documentation

## 2018-10-12 ENCOUNTER — Ambulatory Visit: Payer: Medicare Other

## 2018-10-14 ENCOUNTER — Ambulatory Visit: Payer: Medicare Other | Admitting: Physical Therapy

## 2018-10-14 ENCOUNTER — Ambulatory Visit: Payer: Self-pay | Admitting: Adult Health

## 2018-10-18 ENCOUNTER — Inpatient Hospital Stay (HOSPITAL_BASED_OUTPATIENT_CLINIC_OR_DEPARTMENT_OTHER): Payer: Medicare Other | Admitting: Medical

## 2018-10-18 ENCOUNTER — Telehealth: Payer: Self-pay | Admitting: Oncology

## 2018-10-18 VITALS — BP 146/87 | HR 105 | Temp 98.6°F | Resp 17 | Ht 62.0 in | Wt 148.4 lb

## 2018-10-18 DIAGNOSIS — I10 Essential (primary) hypertension: Secondary | ICD-10-CM | POA: Diagnosis not present

## 2018-10-18 DIAGNOSIS — R519 Headache, unspecified: Secondary | ICD-10-CM

## 2018-10-18 DIAGNOSIS — R600 Localized edema: Secondary | ICD-10-CM

## 2018-10-18 DIAGNOSIS — Z5111 Encounter for antineoplastic chemotherapy: Secondary | ICD-10-CM | POA: Diagnosis not present

## 2018-10-18 DIAGNOSIS — R51 Headache: Secondary | ICD-10-CM | POA: Diagnosis not present

## 2018-10-18 DIAGNOSIS — C541 Malignant neoplasm of endometrium: Secondary | ICD-10-CM | POA: Diagnosis not present

## 2018-10-18 MED ORDER — FUROSEMIDE 20 MG PO TABS: ORAL_TABLET | ORAL | 1 refills | Status: DC

## 2018-10-18 NOTE — Patient Instructions (Signed)
Edema Edema is when you have too much fluid in your body or under your skin. Edema may make your legs, feet, and ankles swell up. Swelling is also common in looser tissues, like around your eyes. This is a common condition. It gets more common as you get older. There are many possible causes of edema. Eating too much salt (sodium) and being on your feet or sitting for a long time can cause edema in your legs, feet, and ankles. Hot weather may make edema worse. Edema is usually painless. Your skin may look swollen or shiny. Follow these instructions at home:  Keep the swollen body part raised (elevated) above the level of your heart when you are sitting or lying down.  Do not sit still or stand for a long time.  Do not wear tight clothes. Do not wear garters on your upper legs.  Exercise your legs. This can help the swelling go down.  Wear elastic bandages or support stockings as told by your doctor.  Eat a low-salt (low-sodium) diet to reduce fluid as told by your doctor.  Depending on the cause of your swelling, you may need to limit how much fluid you drink (fluid restriction).  Take over-the-counter and prescription medicines only as told by your doctor. Contact a doctor if:  Treatment is not working.  You have heart, liver, or kidney disease and have symptoms of edema.  You have sudden and unexplained weight gain. Get help right away if:  You have shortness of breath or chest pain.  You cannot breathe when you lie down.  You have pain, redness, or warmth in the swollen areas.  You have heart, liver, or kidney disease and get edema all of a sudden.  You have a fever and your symptoms get worse all of a sudden. Summary  Edema is when you have too much fluid in your body or under your skin.  Edema may make your legs, feet, and ankles swell up. Swelling is also common in looser tissues, like around your eyes.  Raise (elevate) the swollen body part above the level of your  heart when you are sitting or lying down.  Follow your doctor's instructions about diet and how much fluid you can drink (fluid restriction). This information is not intended to replace advice given to you by your health care provider. Make sure you discuss any questions you have with your health care provider. Document Released: 04/27/2008 Document Revised: 11/27/2016 Document Reviewed: 11/27/2016 Elsevier Interactive Patient Education  2017 [employer]  

## 2018-10-18 NOTE — Progress Notes (Signed)
Pt presents today with bilat lower leg edema, +1 pitting, without rash, pain, drainage, or recent injury.  Pulses palpable, full sensation & ROM bilat.  Pt states "I was told not to take any of my medication anymore because of my chemo" when asked about her medications.

## 2018-10-18 NOTE — Telephone Encounter (Signed)
Tammy Hensley called and said that her feet are swollen and she has a headache.  Both started this morning.  She said the headache is unusual for her.  She also was on a medication - Maxide for the swelling which was stopped by Dr. Alvy Bimler.  She would like to see Symptom Management.  Appointment scheduled with Sandi Mealy, PA at 1:45 pm.

## 2018-10-19 NOTE — Progress Notes (Signed)
[facility] Progress Note   Tammy Hensley [id number] 07-21-1941 77 y.o.  Tammy Hensley is managed by Dr. [name]  Actively treated with chemotherapy/immunotherapy/hormonal therapy: yes  Current Therapy: Carboplatin and paclitaxel  Last Treated: 10/04/2018 (cycle 1, day 1)  Assessment: Plan:    Localized edema - Plan: furosemide (LASIX) 20 MG tablet  Nonintractable headache, unspecified chronicity pattern, unspecified headache type - Plan: furosemide (LASIX) 20 MG tablet  Essential hypertension - Plan: furosemide (LASIX) 20 MG tablet  Endometrial cancer (HCC)   Bilateral lower extremity edema, headache, and elevated blood pressure: Tammy Hensley states that her hydrochlorothiazide had been discontinued by Dr. Alvy Bimler.  She ate a dish that was prepared by her sister last evening.  She states that the food may have had more salt in it than she was used to.  She has developed bilateral lower extremity edema.  Additionally it was noted her blood pressure was slightly elevated at 146/87.  She is also having a right frontal headache which is unusual for her.  She was given a prescription for Lasix 20 mg once daily for 3 days.  She was told that she could repeat this medication as needed if she has recurrent lower extremity edema.  Metastatic endometrial cancer: The patient is status post cycle 1, day 1 of carboplatin and paclitaxel which was dosed on 10/04/2018.  She is scheduled follow-up with Dr. Alvy Bimler on 10/25/2018.  Please see After Visit Summary for patient specific instructions.  Future Appointments  Date Time Provider Intercourse  10/25/2018  9:00 AM [facility] LAB 4 [facility] None  10/25/2018  9:15 AM [facility] FLUSH [facility] None  10/25/2018  9:45 AM [name], MD [facility] None  10/25/2018 10:30 AM [facility] INFUSION [facility] None  10/27/2018  4:00 PM [name], MD [facility] None  11/10/2018  2:30 PM Liane Comber, NP  [facility] None  01/02/2019 10:00 AM [name], MD [facility] None  01/17/2019  9:30 AM [name], MD [facility] None  07/24/2019  9:00 AM [name], MD [facility] None    No orders of the defined types were placed in this encounter.      Subjective:   Patient ID:  Tammy Hensley is a 77 y.o. (DOB 1941-08-03) female.  Chief Complaint:  Chief Complaint  Patient presents with  . Leg Swelling    HPI Tammy Hensley is a 77 year old female with a history of a metastatic ovarian cancer who is managed by Dr. [name] and is status post cycle 1, day 1 of carboplatin and paclitaxel which was dosed on 10/04/2018.  She reports that most of her medications were stopped by Dr. [name].  This includes hydrochlorothiazide.  She had a meal prepared for her last evening by her sister.  She reports that 1 of the dishes tasted as though it may have had more salt than she was used to.  She now has bilateral lower extremity edema with pitting edema.  She also has a headache in her right frontal skull which is unusual for her.  Her blood pressure was noted to be mildly elevated at 146/87.  She is scheduled to see Dr. [name] in follow-up on 10/25/2018.  Medications: I have reviewed the patient's current medications.  Allergies:  Allergies  Allergen Reactions  . Calan [Verapamil]     Constipation   . Effexor [Venlafaxine]     unknown  . Erythromycin Nausea And Vomiting  . Femara [Letrozole] Swelling  . Fosamax [Alendronate]  aching  . Ibuprofen     Dizziness, incoherent   . Paxil [Paroxetine Hcl] Nausea Only  . Remeron [Mirtazapine]     Weight gain   . Tamoxifen     Hair loss @ low dose  . Vasotec [Enalapril] Cough    Past Medical History:  Diagnosis Date  . Avascular necrosis of bones of both hips ([place]) 11/17/2016  . Breast cancer ([place]) 2006   left breast  . Depression   . Diabetes ([place])    borderline  . Family history of cancer   . History of  brachytherapy 03/29/17-04/12/17   vaginal cuff 18 Gy in 3 fractions  . History of radiation therapy 02/10/17-03/16/17   pelvis 45 Gy in 25 fractions  . Hyperlipidemia   . Hypertension   . Iron deficiency anemia   . Vitamin D deficiency     Past Surgical History:  Procedure Laterality Date  . BREAST LUMPECTOMY Left 2006  . BREAST SURGERY  2006   left breast lumpectomy- radiation, took Femara   . IR IMAGING GUIDED PORT INSERTION  09/29/2018  . 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 ROBOTIC ASSISTED LAP VAGINAL HYSTERECTOMY N/A 10/13/2016   Procedure: XI ROBOTIC ASSISTED LAPAROSCOPIC VAGINAL HYSTERECTOMY;  Surgeon: Nancy Marus, MD;  Location: [facility];  Service: Gynecology;  Laterality: N/A;  . ROBOTIC ASSISTED SALPINGO OOPHERECTOMY Bilateral 10/13/2016   Procedure: XI ROBOTIC ASSISTED SALPINGO OOPHORECTOMY;  Surgeon: Nancy Marus, MD;  Location: [facility];  Service: Gynecology;  Laterality: Bilateral;  . SENTINEL NODE BIOPSY N/A 10/13/2016   Procedure: SENTINEL NODE BIOPSY;  Surgeon: Nancy Marus, MD;  Location: [facility];  Service: Gynecology;  Laterality: N/A;    Family History  Problem Relation Age of Onset  . Heart disease Mother   . Heart attack Mother   . Cancer Father   . Heart attack Father   . Prostate cancer Father 102       deceased 64  . Breast cancer Paternal Aunt 10       deceased 22s  . Prostate cancer Paternal Uncle 35       deceased 38  . Cancer Paternal Grandmother        unsure; deceased 108s  . Prostate cancer Brother 84       currently 22  . Prostate cancer Brother 72       currently 65    Social History   Socioeconomic History  . Marital status: Married    Spouse name: Not on file  . Number of children: 2  . Years of education: Not on file  . Highest education level: Not on file  Occupational History  . Not on file  Social Needs  . Financial resource strain: Not on file  . Food  insecurity:    Worry: Not on file    Inability: Not on file  . Transportation needs:    Medical: Not on file    Non-medical: Not on file  Tobacco Use  . Smoking status: Never Smoker  . Smokeless tobacco: Never Used  Substance and Sexual Activity  . Alcohol use: No  . Drug use: No  . Sexual activity: Not on file  Lifestyle  . Physical activity:    Days per week: Not on file    Minutes per session: Not on file  . Stress: Not on file  Relationships  . Social connections:    Talks on  phone: Not on file    Gets together: Not on file    Attends religious service: Not on file    Active member of club or organization: Not on file    Attends meetings of clubs or organizations: Not on file    Relationship status: Not on file  . Intimate partner violence:    Fear of current or ex partner: Not on file    Emotionally abused: Not on file    Physically abused: Not on file    Forced sexual activity: Not on file  Other Topics Concern  . Not on file  Social History Narrative  . Not on file    Past Medical History, Surgical history, Social history, and Family history were reviewed and updated as appropriate.   Please see review of systems for further details on the patient's review from today.   Review of Systems:  Review of Systems  Constitutional: Negative for chills, diaphoresis and fever.  HENT: Negative for trouble swallowing and voice change.   Respiratory: Negative for cough, chest tightness, shortness of breath and wheezing.   Cardiovascular: Positive for leg swelling. Negative for chest pain and palpitations.  Gastrointestinal: Negative for abdominal pain, constipation, diarrhea, nausea and vomiting.  Musculoskeletal: Negative for back pain and myalgias.  Neurological: Positive for headaches. Negative for dizziness and light-headedness.    Objective:   Physical Exam:  BP (!) 146/87 (BP Location: Right Arm, Patient Position: Sitting) Comment: Liza RN is aware  Pulse (!)  105 Comment: Liza RN is aware  Temp 98.6 F (37 C) (Oral)   Resp 17   Ht 5\' 2"  (1.575 m)   Wt 148 lb 6.4 oz (67.3 kg)   SpO2 100%   BMI 27.14 kg/m  ECOG: 0  Physical Exam  Constitutional: No distress.  HENT:  Head: Normocephalic and atraumatic.  Nose: Right sinus exhibits no maxillary sinus tenderness and no frontal sinus tenderness. Left sinus exhibits no maxillary sinus tenderness and no frontal sinus tenderness.  Cardiovascular: Normal rate, regular rhythm and normal heart sounds. Exam reveals no gallop and no friction rub.  No murmur heard. Pulmonary/Chest: Effort normal and breath sounds normal. No respiratory distress. She has no wheezes. She has no rales.  Musculoskeletal: She exhibits edema (1+ pitting edema to the knees bilaterally.).  Neurological: She is alert.  Skin: Skin is warm and dry. No rash noted. She is not diaphoretic. No erythema.    Lab Review:     Component Value Date/Time   NA 139 10/03/2018 0850   NA 137 05/31/2017 0931   K 3.6 10/03/2018 0850   K 3.5 05/31/2017 0931   CL 101 10/03/2018 0850   CL 99 12/20/2012 1336   CO2 28 10/03/2018 0850   CO2 25 05/31/2017 0931   GLUCOSE 144 (H) 10/03/2018 0850   GLUCOSE 161 (H) 05/31/2017 0931   GLUCOSE 110 (H) 12/20/2012 1336   BUN 10 10/03/2018 0850   BUN 10.9 05/31/2017 0931   CREATININE 0.71 10/03/2018 0850   CREATININE 0.63 07/05/2018 1009   CREATININE 0.8 05/31/2017 0931   CALCIUM 9.4 10/03/2018 0850   CALCIUM 10.6 (H) 05/31/2017 0931   PROT 6.4 (L) 10/03/2018 0850   PROT 7.5 05/31/2017 0931   ALBUMIN 2.8 (L) 10/03/2018 0850   ALBUMIN 4.2 05/31/2017 0931   AST 15 10/03/2018 0850   AST 16 05/31/2017 0931   ALT 15 10/03/2018 0850   ALT 15 05/31/2017 0931   ALKPHOS 92 10/03/2018 0850   ALKPHOS 91 05/31/2017  0931   BILITOT 0.4 10/03/2018 0850   BILITOT 0.33 05/31/2017 0931   GFRNONAA >60 10/03/2018 0850   GFRNONAA 87 07/05/2018 1009   GFRAA >60 10/03/2018 0850   GFRAA 100 07/05/2018 1009        Component Value Date/Time   WBC 4.0 10/03/2018 0850   WBC 3.9 (L) 09/29/2018 1011   RBC 4.42 10/03/2018 0850   HGB 11.1 (L) 10/03/2018 0850   HGB 12.9 05/31/2017 0931   HCT 35.5 (L) 10/03/2018 0850   HCT 38.7 05/31/2017 0931   PLT 356 10/03/2018 0850   PLT 323 05/31/2017 0931   MCV 80.3 10/03/2018 0850   MCV 80.7 05/31/2017 0931   MCH 25.1 (L) 10/03/2018 0850   MCHC 31.3 10/03/2018 0850   RDW 16.2 (H) 10/03/2018 0850   RDW 15.6 (H) 05/31/2017 0931   LYMPHSABS 0.4 (L) 10/03/2018 0850   LYMPHSABS 0.5 (L) 05/31/2017 0931   MONOABS 0.3 10/03/2018 0850   MONOABS 0.1 05/31/2017 0931   EOSABS 0.0 10/03/2018 0850   EOSABS 0.0 05/31/2017 0931   BASOSABS 0.0 10/03/2018 0850   BASOSABS 0.0 05/31/2017 0931   -------------------------------  Imaging from last 24 hours (if applicable):  Radiology interpretation: Dg Knee 4 Views W/patella Right  Result Date: 10/04/2018 CLINICAL DATA:  [place] today onto right knee while undergoing chemotherapy, endometrial carcinoma, remote breast carcinoma EXAM: RIGHT KNEE - COMPLETE 4+ VIEW COMPARISON:  None. FINDINGS: For age there is only mild tricompartmental degenerative joint disease the right knee with some sclerosis and spurring primarily involving the lateral compartment. No fracture is seen and no joint effusion is noted. IMPRESSION: No acute abnormality. 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 Electronically Signed   By: Ivar Drape M.D.   On: 10/04/2018 16:02   Ir Imaging Guided Port Insertion  Result Date: 09/29/2018 CLINICAL DATA:  Recurrent metastatic endometrial carcinoma and need for porta cath for chemotherapy. EXAM: IMPLANTED PORT A CATH PLACEMENT WITH ULTRASOUND AND FLUOROSCOPIC GUIDANCE ANESTHESIA/SEDATION: 2.0 mg IV Versed; 50 mcg IV Fentanyl Total Moderate Sedation Time:  38 minutes The patient's level of consciousness and physiologic status were continuously monitored during the procedure by  Radiology nursing. Additional Medications: 2 g IV Ancef. FLUOROSCOPY TIME:  18 seconds.  2.3 mGy. PROCEDURE: The procedure, risks, benefits, and alternatives were explained to the patient. Questions regarding the procedure were encouraged and answered. The patient understands and consents to the procedure. A time-out was performed prior to initiating the procedure. Ultrasound was utilized to confirm patency of the right internal jugular vein. The right neck and chest were prepped with chlorhexidine in a sterile fashion, and a sterile drape was applied covering the operative field. Maximum barrier sterile technique with sterile gowns and gloves were used for the procedure. Local anesthesia was provided with 1% lidocaine. After creating a small venotomy incision, a 21 gauge needle was advanced into the right internal jugular vein under direct, real-time ultrasound guidance. Ultrasound image documentation was performed. After securing guidewire access, an 8 Fr dilator was placed. A J-wire was kinked to measure appropriate catheter length. A subcutaneous port pocket was then created along the upper chest wall utilizing sharp and blunt dissection. Portable cautery was utilized. The pocket was irrigated with sterile saline. A single lumen power injectable port was chosen for placement. The 8 Fr catheter was tunneled from the port pocket site to the venotomy incision. The port was placed in the pocket. External catheter was trimmed to appropriate length based on guidewire measurement.  At the venotomy, an 8 Fr peel-away sheath was placed over a guidewire. The catheter was then placed through the sheath and the sheath removed. Final catheter positioning was confirmed and documented with a fluoroscopic spot image. The port was accessed with a needle and aspirated and flushed with heparinized saline. The access needle was removed. The venotomy and port pocket incisions were closed with subcutaneous 3-0 Monocryl and  subcuticular 4-0 Vicryl. Dermabond was applied to both incisions. COMPLICATIONS: COMPLICATIONS None FINDINGS: After catheter placement, the tip lies at the cavo-atrial junction. The catheter aspirates normally and is ready for immediate use. IMPRESSION: Placement of single lumen port a cath via right internal jugular vein. The catheter tip lies at the cavo-atrial junction. A power injectable port a cath was placed and is ready for immediate use. Electronically Signed   By: Aletta Edouard M.D.   On: 09/29/2018 12:58

## 2018-10-24 ENCOUNTER — Telehealth: Payer: Self-pay | Admitting: Oncology

## 2018-10-24 NOTE — Telephone Encounter (Signed)
Tammy Hensley called and verified her appointments for tomorrow.

## 2018-10-25 ENCOUNTER — Inpatient Hospital Stay: Payer: Medicare Other

## 2018-10-25 ENCOUNTER — Telehealth: Payer: Self-pay | Admitting: Hematology and Oncology

## 2018-10-25 ENCOUNTER — Inpatient Hospital Stay: Payer: Medicare Other | Attending: Hematology and Oncology

## 2018-10-25 ENCOUNTER — Inpatient Hospital Stay (HOSPITAL_BASED_OUTPATIENT_CLINIC_OR_DEPARTMENT_OTHER): Payer: Medicare Other | Admitting: Hematology and Oncology

## 2018-10-25 ENCOUNTER — Encounter: Payer: Self-pay | Admitting: Oncology

## 2018-10-25 DIAGNOSIS — C541 Malignant neoplasm of endometrium: Secondary | ICD-10-CM

## 2018-10-25 DIAGNOSIS — C7801 Secondary malignant neoplasm of right lung: Secondary | ICD-10-CM

## 2018-10-25 DIAGNOSIS — R5381 Other malaise: Secondary | ICD-10-CM | POA: Diagnosis not present

## 2018-10-25 DIAGNOSIS — D509 Iron deficiency anemia, unspecified: Secondary | ICD-10-CM | POA: Insufficient documentation

## 2018-10-25 DIAGNOSIS — Z79899 Other long term (current) drug therapy: Secondary | ICD-10-CM | POA: Insufficient documentation

## 2018-10-25 DIAGNOSIS — C7951 Secondary malignant neoplasm of bone: Secondary | ICD-10-CM

## 2018-10-25 DIAGNOSIS — G893 Neoplasm related pain (acute) (chronic): Secondary | ICD-10-CM

## 2018-10-25 DIAGNOSIS — Z5111 Encounter for antineoplastic chemotherapy: Secondary | ICD-10-CM | POA: Diagnosis present

## 2018-10-25 LAB — CBC WITH DIFFERENTIAL (CANCER CENTER ONLY)
Abs Immature Granulocytes: 0.05 10*3/uL (ref 0.00–0.07)
Basophils Absolute: 0 10*3/uL (ref 0.0–0.1)
Basophils Relative: 0 %
EOS ABS: 0 10*3/uL (ref 0.0–0.5)
Eosinophils Relative: 0 %
HCT: 35.1 % — ABNORMAL LOW (ref 36.0–46.0)
Hemoglobin: 11 g/dL — ABNORMAL LOW (ref 12.0–15.0)
Immature Granulocytes: 1 %
Lymphocytes Relative: 8 %
Lymphs Abs: 0.4 10*3/uL — ABNORMAL LOW (ref 0.7–4.0)
MCH: 24.9 pg — AB (ref 26.0–34.0)
MCHC: 31.3 g/dL (ref 30.0–36.0)
MCV: 79.4 fL — ABNORMAL LOW (ref 80.0–100.0)
Monocytes Absolute: 0.1 10*3/uL (ref 0.1–1.0)
Monocytes Relative: 1 %
Neutro Abs: 4.8 10*3/uL (ref 1.7–7.7)
Neutrophils Relative %: 90 %
Platelet Count: 359 10*3/uL (ref 150–400)
RBC: 4.42 MIL/uL (ref 3.87–5.11)
RDW: 16.3 % — ABNORMAL HIGH (ref 11.5–15.5)
WBC: 5.3 10*3/uL (ref 4.0–10.5)
nRBC: 0 % (ref 0.0–0.2)

## 2018-10-25 LAB — CMP (CANCER CENTER ONLY)
ALT: 9 U/L (ref 0–44)
AST: 11 U/L — ABNORMAL LOW (ref 15–41)
Albumin: 3.2 g/dL — ABNORMAL LOW (ref 3.5–5.0)
Alkaline Phosphatase: 94 U/L (ref 38–126)
Anion gap: 12 (ref 5–15)
BUN: 11 mg/dL (ref 8–23)
CO2: 25 mmol/L (ref 22–32)
Calcium: 9.7 mg/dL (ref 8.9–10.3)
Chloride: 104 mmol/L (ref 98–111)
Creatinine: 0.75 mg/dL (ref 0.44–1.00)
GFR, Est AFR Am: >60 mL/min (ref >60)
Glucose, Bld: 222 mg/dL — ABNORMAL HIGH (ref 70–99)
Potassium: 3.7 mmol/L (ref 3.5–5.1)
Sodium: 141 mmol/L (ref 135–145)
TOTAL PROTEIN: 7 g/dL (ref 6.5–8.1)
Total Bilirubin: 0.3 mg/dL (ref 0.3–1.2)

## 2018-10-25 LAB — MAGNESIUM: Magnesium: 1.8 mg/dL (ref 1.7–2.4)

## 2018-10-25 LAB — URIC ACID: Uric Acid, Serum: 4.4 mg/dL (ref 2.5–7.1)

## 2018-10-25 MED ORDER — SODIUM CHLORIDE 0.9 % IV SOLN
25.0000 mg | Freq: Once | INTRAVENOUS | Status: AC
  Administered 2018-10-25: 25 mg via INTRAVENOUS
  Filled 2018-10-25: qty 0.5

## 2018-10-25 MED ORDER — SODIUM CHLORIDE 0.9 % IV SOLN
131.2500 mg/m2 | Freq: Once | INTRAVENOUS | Status: AC
  Administered 2018-10-25: 222 mg via INTRAVENOUS
  Filled 2018-10-25: qty 37

## 2018-10-25 MED ORDER — SODIUM CHLORIDE 0.9 % IV SOLN
364.5000 mg | Freq: Once | INTRAVENOUS | Status: AC
  Administered 2018-10-25: 360 mg via INTRAVENOUS
  Filled 2018-10-25: qty 36

## 2018-10-25 MED ORDER — SODIUM CHLORIDE 0.9% FLUSH
10.0000 mL | INTRAVENOUS | Status: DC | PRN
  Administered 2018-10-25: 10 mL
  Filled 2018-10-25: qty 10

## 2018-10-25 MED ORDER — PALONOSETRON HCL INJECTION 0.25 MG/5ML
INTRAVENOUS | Status: AC
  Filled 2018-10-25: qty 5

## 2018-10-25 MED ORDER — FAMOTIDINE IN NACL 20-0.9 MG/50ML-% IV SOLN
20.0000 mg | Freq: Once | INTRAVENOUS | Status: AC
  Administered 2018-10-25: 20 mg via INTRAVENOUS

## 2018-10-25 MED ORDER — SODIUM CHLORIDE 0.9% FLUSH
10.0000 mL | Freq: Once | INTRAVENOUS | Status: AC
  Administered 2018-10-25: 10 mL
  Filled 2018-10-25: qty 10

## 2018-10-25 MED ORDER — SODIUM CHLORIDE 0.9 % IV SOLN
Freq: Once | INTRAVENOUS | Status: AC
  Administered 2018-10-25: 12:00:00 via INTRAVENOUS
  Filled 2018-10-25: qty 5

## 2018-10-25 MED ORDER — PALONOSETRON HCL INJECTION 0.25 MG/5ML
0.2500 mg | Freq: Once | INTRAVENOUS | Status: AC
  Administered 2018-10-25: 0.25 mg via INTRAVENOUS

## 2018-10-25 MED ORDER — FAMOTIDINE IN NACL 20-0.9 MG/50ML-% IV SOLN
INTRAVENOUS | Status: AC
  Filled 2018-10-25: qty 50

## 2018-10-25 MED ORDER — SODIUM CHLORIDE 0.9 % IV SOLN
Freq: Once | INTRAVENOUS | Status: AC
  Administered 2018-10-25: 11:00:00 via INTRAVENOUS
  Filled 2018-10-25: qty 250

## 2018-10-25 MED ORDER — HEPARIN SOD (PORK) LOCK FLUSH 100 UNIT/ML IV SOLN
500.0000 [IU] | Freq: Once | INTRAVENOUS | Status: AC | PRN
  Administered 2018-10-25: 500 [IU]
  Filled 2018-10-25: qty 5

## 2018-10-25 NOTE — Patient Instructions (Signed)
   [facility] Discharge Instructions for Patients Receiving Chemotherapy  Today you received the following chemotherapy agents Taxol and Carboplatin   To help prevent nausea and vomiting after your treatment, we encourage you to take your nausea medication as directed.    If you develop nausea and vomiting that is not controlled by your nausea medication, call the clinic.   BELOW ARE SYMPTOMS THAT SHOULD BE REPORTED IMMEDIATELY:  *FEVER GREATER THAN 100.5 F  *CHILLS WITH OR WITHOUT FEVER  NAUSEA AND VOMITING THAT IS NOT CONTROLLED WITH YOUR NAUSEA MEDICATION  *UNUSUAL SHORTNESS OF BREATH  *UNUSUAL BRUISING OR BLEEDING  TENDERNESS IN MOUTH AND THROAT WITH OR WITHOUT PRESENCE OF ULCERS  *URINARY PROBLEMS  *BOWEL PROBLEMS  UNUSUAL RASH Items with * indicate a potential emergency and should be followed up as soon as possible.  Feel free to call the clinic should you have any questions or concerns. The clinic phone number is [phone].  Please show the CHEMO ALERT CARD at check-in to the Emergency Department and triage nurse.   

## 2018-10-25 NOTE — Telephone Encounter (Signed)
Gave patient avs and calendar.   °

## 2018-10-26 ENCOUNTER — Encounter: Payer: Self-pay | Admitting: Hematology and Oncology

## 2018-10-26 LAB — CA 125: Cancer Antigen (CA) 125: 7.5 U/mL (ref 0.0–38.1)

## 2018-10-26 NOTE — Assessment & Plan Note (Signed)
She is noted to have progressive microcytic anemia Plan to recheck iron study

## 2018-10-26 NOTE — Assessment & Plan Note (Signed)
She had canceled physical therapy recently due to fatigue I recommend resumption of physical therapy to preserve muscle strength

## 2018-10-26 NOTE — Assessment & Plan Note (Signed)
This is currently stable on current prescription pain medicine.  We will continue the same

## 2018-10-26 NOTE — Progress Notes (Signed)
[place] OFFICE PROGRESS NOTE  Patient Care Team: [name], MD as PCP - General (Internal Medicine) [name], MD as Consulting Physician (Gastroenterology) [name], MD as Consulting Physician (Ophthalmology) Magrinat, Virgie Dad, MD as Consulting Physician (Oncology) [name], [name], MD as Consulting Physician (Obstetrics and Gynecology) [name], MD as Consulting Physician (Hematology and Oncology) [name], MD as Consulting Physician (Radiation Oncology)  ASSESSMENT & PLAN:  Endometrial cancer [facility]) She tolerated chemotherapy well except for fatigue and occasional back pain She is eating well and gaining weight Denies worsening peripheral neuropathy I plan 3 cycles of chemotherapy before repeat imaging study  Cancer associated pain This is currently stable on current prescription pain medicine.  We will continue the same  Iron deficiency anemia She is noted to have progressive microcytic anemia Plan to recheck iron study  Physical debility She had canceled physical therapy recently due to fatigue I recommend resumption of physical therapy to preserve muscle strength   Orders Placed This Encounter  Procedures  . Iron and TIBC    Standing Status:   Future    Standing Expiration Date:   11/30/2019  . Ferritin    Standing Status:   Future    Standing Expiration Date:   11/30/2019    INTERVAL HISTORY: Please see below for problem oriented charting. 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 SEE COMMENT.  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 The greatest extent of invasive carcinoma as measured on the needle core biopsy measures 0.6 cm.       Endometrial cancer ([place])   02/03/2002 Pathology Results    1. BENIGN ENDOMETRIAL POLYPS.  2. UTERINE FIBROIDS: PORTIONS OF SMOOTH MUSCLE CONSISTENT WITH BENIGN LEIOMYOMA(S). PORTIONS OF BENIGN, CYSTICALLY ATROPHIC ENDOMETRIUM WITHOUT HYPERPLASIA OR EVIDENCE OF MALIGNANCY. 3. ENDOMETRIAL CURETTAGE: BENIGN ENDOMETRIUM AND SMOOTH MUSCLE.    09/07/2016 Pathology Results    PAP smear positive for malignant cells    09/07/2016 Initial Diagnosis    Patient had postmenopausal bleeding in 08-2016. She had PAP 09-07-16 by PCP Dr [name], which documented carcinoma. She had evaluation by Dr [name] with colposcopy/ ECC/endometrial biopsy documenting serous endometrioid endometrial carcinoma.    09/15/2016 Imaging    Ct imaging showed markedly thickened and heterogeneous endometrial stripe suspicious for endometrial carcinoma in this patient with postmenopausal vaginal bleeding. Cystic lesion in the right ovary cannot be definitively characterized. Pelvic ultrasound is recommended for further evaluation. Aortoiliac atherosclerosis. Avascular necrosis of the femoral heads bilaterally.    10/07/2016 Tumor Marker    Patient's tumor was tested for the following markers: CA125 Results of the tumor marker test revealed 15.1    10/13/2016 Pathology Results    1. Lymph node, sentinel, biopsy, right obturator - THERE IS NO EVIDENCE OF CARCINOMA IN 8 OF 8 LYMPH NODES (0/8). - SEE COMMENT.  2. Lymph node, sentinel, biopsy, left obturator - THERE IS NO EVIDENCE OF CARCINOMA IN 1 OF 1 LYMPH NODE  (0/1). - SEE COMMENT. 3. Lymph node, sentinel, biopsy, left para-aortic - THERE IS NO EVIDENCE OF CARCINOMA IN 4 OF 4 LYMPH NODES (0/4). - SEE COMMENT. 4. Uterus +/- tubes/ovaries, neoplastic, cervix - INVASIVE MIXED SEROUS/ENDOMETRIOID ADENOCARCINOMA, MULTIPLE FOCI, FIGO GRADE III, THE LARGEST FOCUS SPANS 8.2 CM. - ADENOCARCINOMA EXTENDS INTO THE OUTER HALF OF THE MYOMETRIUM AND INVOLVES THE STROMA OF THE LOWER UTERINE SEGMENT AND CERVIX. - LYMPHOVASCULAR INVASION IS IDENTIFIED. - THE SURGICAL RESECTION MARGINS ARE NEGATIVE FOR CARCINOMA. - SEE ONCOLOGY TABLE BELOW. ADDITIONAL FINDINGS: - ENDOMETRIUM: ENDOMETRIOID TYPE POLYP(S), WHICH CONTAIN FOCI OF SIMILAR APPEARING MIXED SEROUS/ENDOMETRIOID ADENOCARCINOMA. - MYOMETRIUM: LEIOMYOMATA. - SEROSA: UNREMARKABLE. - BILATERAL ADNEXA: BENIGN OVARIES AND FALLOPIAN TUBES.    10/13/2016 Surgery    Surgery: Total robotic hysterectomy bilateral salpingo-oophorectomy, bilateral pelvic sentinel lymph node removal, left PA sentinel lymph node removal. Mini-laparotomy for specimen removal Surgeons:  [name], MD; [name], MD  Pathology:  1)Uterus, cervix, bilateral tubes and ovaries 2) Right obturator SLN 3) Left Obturator SLN 4) Left PA SLN Operative findings: 14 week fibroid uterus with one large dominant 6 cm myoma that was palpably calcified. 4 cm right ovarian cyst. + SLN identified in the bilateral obturator spaces, + SLN identified in the left PA space.     12/01/2016 - 05/31/2017 Chemotherapy    She received carboplatin & Taxol. She had 3 cycles of chemo followed by radiation and then 3 more cycles of chemo    01/11/2017 Genetic Testing    Testing was normal and did not reveal a mutation in these genes: The genes tested were the 80 genes on [employer]'s Multi-Cancer panel (ALK, APC, ATM, AXIN2, BAP1, BARD1, BLM, BMPR1A, BRCA1, BRCA2, BRIP1, CASR, CDC73, CDH1, CDK4, CDKN1B, CDKN1C, CDKN2A, CEBPA, CHEK2, DICER1, DIS3L2, EGFR,  EPCAM, FH, FLCN, GATA2, GPC3, GREM1, HOXB13,  HRAS, KIT, MAX, MEN1, MET, MITF, MLH1, MSH2, MSH6, MUTYH, NBN, NF1, NF2, PALB2, PDGFRA, PHOX2B, PMS2, POLD1, POLE, POT1, PRKAR1A, PTCH1, PTEN, RAD50, RAD51C, RAD51D, RB1, RECQL4, RET, RUNX1, SDHA, SDHAF2, SDHB, SDHC, SDHD, SMAD4, SMARCA4, SMARCB1, SMARCE1, STK11, SUFU, TERC, TERT, TMEM127, TP53, TSC1, TSC2, VHL, WRN, and WT1).    02/10/2017 - 04/12/2017 Radiation Therapy    02/10/17-03/16/17; 03/29/17-04/12/17;  1) Pelvis/ 45 Gy in 25 fractions  2) Vaginal Cuff/ 18 Gy in 3 fractions    08/26/2018 Imaging    Large destructive mass lesion left iliac bone with large soft tissue mass extending into the posterior soft tissues and pelvis compatible with metastatic disease. Consider follow-up CT chest abdomen pelvis with contrast for further staging.  Radiation changes at L5 and the sacrum. No lumbar metastatic deposits  Multilevel degenerative changes above.    08/29/2018 Imaging    1. 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 2. New irregular nodular opacity in the medial right middle lobe, new since 2017 CT. Separate subcentimeter right upper lobe solid pulmonary nodule, for which no comparison exists. These findings are indeterminate for pulmonary metastases and attention on follow-up chest CT in 3 months is recommended. 3. No lymphadenopathy or additional findings of metastatic disease.No tumor recurrence at the hysterectomy margin. 4. Aortic Atherosclerosis (ICD10-I70.0). Additional chronic findings as detailed.    08/29/2018 Tumor Marker    Patient's tumor was tested for the following markers: CA-125 Results of the tumor marker test revealed 17.4    09/06/2018 Procedure  CT-guided core biopsy performed of huge mass in the left iliac fossa destroying the left iliac bone.    09/06/2018 Pathology Results    Soft Tissue Needle Core Biopsy, Left Iliac Fossa - POORLY DIFFERENTIATED MALIGNANT  NEOPLASM. Microscopic Comment The provided clinical history of both endometrial and breast cancer is noted. In the current case, immunohistochemistry for qualitative ER demonstrates scattered positive staining. CK8/18 is focally positive. CKAE1/3, CK7, CK20, TTF-1, CDX-2, GATA3 and PAX 8 are negative. The immunophenotype is not specific as to an origin for this poorly differentiated malignant neoplasm. The morphology is more suggestive of endometrial cancer than breast cancer, and may represent an unrecognized carcinosarcomatous component.    09/08/2018 - 09/27/2018 Radiation Therapy    She had palliative radiation treatment    09/29/2018 Procedure    Placement of single lumen port a cath via right internal jugular vein. The catheter tip lies at the cavo-atrial junction. A power injectable port a cath was placed and is ready for immediate use.    10/04/2018 -  Chemotherapy    The patient had carboplatin and taxol     Metastasis to bone ([place])   08/30/2018 Initial Diagnosis    Metastasis to bone ([place])    09/15/2018 -  Chemotherapy    The patient had palonosetron (ALOXI) injection 0.25 mg, 0.25 mg, Intravenous,  Once, 2 of 4 cycles Administration: 0.25 mg (10/04/2018), 0.25 mg (10/25/2018) CARBOplatin (PARAPLATIN) 360 mg in sodium chloride 0.9 % 250 mL chemo infusion, 360 mg (100 % of original dose 364.5 mg), Intravenous,  Once, 2 of 4 cycles Dose modification:   (original dose 364.5 mg, Cycle 1) Administration: 360 mg (10/04/2018), 360 mg (10/25/2018) PACLitaxel (TAXOL) 222 mg in sodium chloride 0.9 % 250 mL chemo infusion (> '80mg'$ /m2), 131.25 mg/m2 = 222 mg (75 % of original dose 175 mg/m2), Intravenous,  Once, 2 of 4 cycles Dose modification: 131.25 mg/m2 (75 % of original dose 175 mg/m2, Cycle 1, Reason: Dose Not Tolerated) Administration: 222 mg (10/04/2018), 222 mg (10/25/2018) fosaprepitant (EMEND) 150 mg, dexamethasone (DECADRON) 12 mg in sodium chloride 0.9 % 145 mL IVPB, , Intravenous,   Once, 2 of 4 cycles Administration:  (10/04/2018),  (10/25/2018)  for chemotherapy treatment.      REVIEW OF SYSTEMS:   Constitutional: Denies fevers, chills or abnormal weight loss Eyes: Denies blurriness of vision Ears, nose, mouth, throat, and face: Denies mucositis or sore throat Respiratory: Denies cough, dyspnea or wheezes Cardiovascular: Denies palpitation, chest discomfort or lower extremity swelling Gastrointestinal:  Denies nausea, heartburn or change in bowel habits Skin: Denies abnormal skin rashes Lymphatics: Denies new lymphadenopathy or easy bruising Neurological:Denies numbness, tingling or new weaknesses Behavioral/Psych: Mood is stable, no new changes  All other systems were reviewed with the patient and are negative.  I have reviewed the past medical history, past surgical history, social history and family history with the patient and they are unchanged from previous note.  ALLERGIES:  is allergic to calan [verapamil]; effexor [venlafaxine]; erythromycin; femara [letrozole]; fosamax [alendronate]; ibuprofen; paxil [paroxetine hcl]; remeron [mirtazapine]; tamoxifen; and vasotec [enalapril].  MEDICATIONS:  Current Outpatient Medications  Medication Sig Dispense Refill  . acetaminophen (TYLENOL) 500 MG tablet Take 500 mg by mouth daily as needed for moderate pain or headache.    Marland Kitchen aspirin 81 MG tablet Take 81 mg by mouth daily.      . Calcium Carbonate-Vitamin D (CALCIUM 600 + D PO) Take 1 tablet by mouth 2 (two) times daily.     Marland Kitchen  Cholecalciferol (VITAMIN D) 2000 units CAPS Take 2,000 Units by mouth every other day.    Marland Kitchen dexamethasone (DECADRON) 4 MG tablet Take 3 tabs at the night before and 3 tab the morning of chemotherapy, every 3 weeks, by mouth (Patient not taking: Reported on 10/18/2018) 36 tablet 0  . furosemide (LASIX) 20 MG tablet Once daily for 3 days, repeat as needed for lower extremity edema 30 tablet 1  . gabapentin (NEURONTIN) 300 MG capsule Take 1  capsule (300 mg total) by mouth 2 (two) times daily. (Patient not taking: Reported on 10/18/2018) 60 capsule 11  . levothyroxine (SYNTHROID, LEVOTHROID) 88 MCG tablet Take 1 tablet (88 mcg total) by mouth daily before breakfast. (Patient not taking: Reported on 10/18/2018) 90 tablet 1  . lidocaine-prilocaine (EMLA) cream Apply to affected area once (Patient not taking: Reported on 10/18/2018) 30 g 3  . loratadine (CLARITIN) 10 MG tablet Take 10 mg by mouth daily as needed for allergies.    Marland Kitchen loratadine-pseudoephedrine (CLARITIN-D 24 HOUR) 10-240 MG 24 hr tablet Take 1 tablet by mouth daily as needed for allergies.    . metFORMIN (GLUCOPHAGE-XR) 500 MG 24 hr tablet TAKE ONE TABLET BY MOUTH WITH BREAKFAST AND LUNCH AND TAKE TWO TABLETS BY MOUTH WITH SUPPER FOR DAIBETES. (Patient not taking: Reported on 10/18/2018) 360 tablet 1  . Multiple Vitamin (MULTIVITAMIN WITH MINERALS) TABS tablet Take 1 tablet by mouth daily.    . ondansetron (ZOFRAN) 8 MG tablet Take 1 tablet (8 mg total) by mouth every 8 (eight) hours as needed for refractory nausea / vomiting. Start on day 3 after chemo. (Patient not taking: Reported on 10/18/2018) 30 tablet 1  . pravastatin (PRAVACHOL) 40 MG tablet Take 1 tablet (40 mg total) by mouth every evening. (Patient not taking: Reported on 10/18/2018) 90 tablet 1  . prochlorperazine (COMPAZINE) 10 MG tablet Take 1 tablet (10 mg total) by mouth every 6 (six) hours as needed (Nausea or vomiting). (Patient not taking: Reported on 10/18/2018) 30 tablet 1  . traMADol (ULTRAM) 50 MG tablet Take 1 tablet (50 mg total) by mouth 3 (three) times daily. Take 1/2 to 1 tablet every 4 hours as needed for severe Back Pain (Patient not taking: Reported on 10/18/2018) 90 tablet 0   No current facility-administered medications for this visit.     PHYSICAL EXAMINATION: ECOG PERFORMANCE STATUS: 1 - Symptomatic but completely ambulatory  Vitals:   10/25/18 1002  BP: (!) 151/80  Pulse: 98  Resp: 18   Temp: 98.6 F (37 C)  SpO2: 99%   Filed Weights   10/25/18 1002  Weight: 148 lb 3.2 oz (67.2 kg)    GENERAL:alert, no distress and comfortable SKIN: skin color, texture, turgor are normal, no rashes or significant lesions EYES: normal, Conjunctiva are pink and non-injected, sclera clear OROPHARYNX:no exudate, no erythema and lips, buccal mucosa, and tongue normal  NECK: supple, thyroid normal size, non-tender, without nodularity LYMPH:  no palpable lymphadenopathy in the cervical, axillary or inguinal LUNGS: clear to auscultation and percussion with normal breathing effort HEART: regular rate & rhythm and no murmurs and no lower extremity edema ABDOMEN:abdomen soft, non-tender and normal bowel sounds Musculoskeletal:no cyanosis of digits and no clubbing  NEURO: alert & oriented x 3 with fluent speech, no focal motor/sensory deficits  LABORATORY DATA:  I have reviewed the data as listed    Component Value Date/Time   NA 141 10/25/2018 0916   NA 137 05/31/2017 0931   K 3.7 10/25/2018 0916  K 3.5 05/31/2017 0931   CL 104 10/25/2018 0916   CL 99 12/20/2012 1336   CO2 25 10/25/2018 0916   CO2 25 05/31/2017 0931   GLUCOSE 222 (H) 10/25/2018 0916   GLUCOSE 161 (H) 05/31/2017 0931   GLUCOSE 110 (H) 12/20/2012 1336   BUN 11 10/25/2018 0916   BUN 10.9 05/31/2017 0931   CREATININE 0.75 10/25/2018 0916   CREATININE 0.63 07/05/2018 1009   CREATININE 0.8 05/31/2017 0931   CALCIUM 9.7 10/25/2018 0916   CALCIUM 10.6 (H) 05/31/2017 0931   PROT 7.0 10/25/2018 0916   PROT 7.5 05/31/2017 0931   ALBUMIN 3.2 (L) 10/25/2018 0916   ALBUMIN 4.2 05/31/2017 0931   AST 11 (L) 10/25/2018 0916   AST 16 05/31/2017 0931   ALT 9 10/25/2018 0916   ALT 15 05/31/2017 0931   ALKPHOS 94 10/25/2018 0916   ALKPHOS 91 05/31/2017 0931   BILITOT 0.3 10/25/2018 0916   BILITOT 0.33 05/31/2017 0931   GFRNONAA >60 10/25/2018 0916   GFRNONAA 87 07/05/2018 1009   GFRAA >60 10/25/2018 0916   GFRAA 100  07/05/2018 1009    No results found for: SPEP, UPEP  Lab Results  Component Value Date   WBC 5.3 10/25/2018   NEUTROABS 4.8 10/25/2018   HGB 11.0 (L) 10/25/2018   HCT 35.1 (L) 10/25/2018   MCV 79.4 (L) 10/25/2018   PLT 359 10/25/2018      Chemistry      Component Value Date/Time   NA 141 10/25/2018 0916   NA 137 05/31/2017 0931   K 3.7 10/25/2018 0916   K 3.5 05/31/2017 0931   CL 104 10/25/2018 0916   CL 99 12/20/2012 1336   CO2 25 10/25/2018 0916   CO2 25 05/31/2017 0931   BUN 11 10/25/2018 0916   BUN 10.9 05/31/2017 0931   CREATININE 0.75 10/25/2018 0916   CREATININE 0.63 07/05/2018 1009   CREATININE 0.8 05/31/2017 0931      Component Value Date/Time   CALCIUM 9.7 10/25/2018 0916   CALCIUM 10.6 (H) 05/31/2017 0931   ALKPHOS 94 10/25/2018 0916   ALKPHOS 91 05/31/2017 0931   AST 11 (L) 10/25/2018 0916   AST 16 05/31/2017 0931   ALT 9 10/25/2018 0916   ALT 15 05/31/2017 0931   BILITOT 0.3 10/25/2018 0916   BILITOT 0.33 05/31/2017 0931       RADIOGRAPHIC STUDIES: I have personally reviewed the radiological images as listed and agreed with the findings in the report. Dg Knee 4 Views W/patella Right  Result Date: 10/04/2018 CLINICAL DATA:  [place] today onto right knee while undergoing chemotherapy, endometrial carcinoma, remote breast carcinoma EXAM: RIGHT KNEE - COMPLETE 4+ VIEW COMPARISON:  None. FINDINGS: For age there is only mild tricompartmental degenerative joint disease the right knee with some sclerosis and spurring primarily involving the lateral compartment. No fracture is seen and no joint effusion is noted. IMPRESSION: No acute abnormality. 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 Electronically Signed   By: Ivar Drape M.D.   On: 10/04/2018 16:02   [facility] Guided Port Insertion  Result Date: 09/29/2018 CLINICAL DATA:  Recurrent metastatic endometrial carcinoma and need for porta cath for chemotherapy. EXAM: IMPLANTED PORT  A CATH PLACEMENT WITH ULTRASOUND AND FLUOROSCOPIC GUIDANCE ANESTHESIA/SEDATION: 2.0 mg IV Versed; 50 mcg IV Fentanyl Total Moderate Sedation Time:  38 minutes The patient's level of consciousness and physiologic status were continuously monitored during the procedure by Radiology nursing. Additional Medications: 2 g IV  Ancef. FLUOROSCOPY TIME:  18 seconds.  2.3 mGy. PROCEDURE: The procedure, risks, benefits, and alternatives were explained to the patient. Questions regarding the procedure were encouraged and answered. The patient understands and consents to the procedure. A time-out was performed prior to initiating the procedure. Ultrasound was utilized to confirm patency of the right internal jugular vein. The right neck and chest were prepped with chlorhexidine in a sterile fashion, and a sterile drape was applied covering the operative field. Maximum barrier sterile technique with sterile gowns and gloves were used for the procedure. Local anesthesia was provided with 1% lidocaine. After creating a small venotomy incision, a 21 gauge needle was advanced into the right internal jugular vein under direct, real-time ultrasound guidance. Ultrasound image documentation was performed. After securing guidewire access, an 8 Fr dilator was placed. A J-wire was kinked to measure appropriate catheter length. A subcutaneous port pocket was then created along the upper chest wall utilizing sharp and blunt dissection. Portable cautery was utilized. The pocket was irrigated with sterile saline. A single lumen power injectable port was chosen for placement. The 8 Fr catheter was tunneled from the port pocket site to the venotomy incision. The port was placed in the pocket. External catheter was trimmed to appropriate length based on guidewire measurement. At the venotomy, an 8 Fr peel-away sheath was placed over a guidewire. The catheter was then placed through the sheath and the sheath removed. Final catheter positioning was  confirmed and documented with a fluoroscopic spot image. The port was accessed with a needle and aspirated and flushed with heparinized saline. The access needle was removed. The venotomy and port pocket incisions were closed with subcutaneous 3-0 Monocryl and subcuticular 4-0 Vicryl. Dermabond was applied to both incisions. COMPLICATIONS: COMPLICATIONS None FINDINGS: After catheter placement, the tip lies at the cavo-atrial junction. The catheter aspirates normally and is ready for immediate use. IMPRESSION: Placement of single lumen port a cath via right internal jugular vein. The catheter tip lies at the cavo-atrial junction. A power injectable port a cath was placed and is ready for immediate use. Electronically Signed   By: Aletta Edouard M.D.   On: 09/29/2018 12:58    All questions were answered. The patient knows to call the clinic with any problems, questions or concerns. No barriers to learning was detected.  I spent 15 minutes counseling the patient face to face. The total time spent in the appointment was 20 minutes and more than 50% was on counseling and review of test results  [name], MD 10/26/2018 7:56 AM

## 2018-10-26 NOTE — Assessment & Plan Note (Signed)
She tolerated chemotherapy well except for fatigue and occasional back pain She is eating well and gaining weight Denies worsening peripheral neuropathy I plan 3 cycles of chemotherapy before repeat imaging study

## 2018-10-27 ENCOUNTER — Encounter: Payer: Self-pay | Admitting: Radiation Oncology

## 2018-10-27 ENCOUNTER — Ambulatory Visit
Admission: RE | Admit: 2018-10-27 | Discharge: 2018-10-27 | Disposition: A | Discharge status: Home / Self Care | Payer: Medicare Other | Source: Ambulatory Visit | Attending: Radiation Oncology | Admitting: Radiation Oncology

## 2018-10-27 ENCOUNTER — Other Ambulatory Visit: Payer: Self-pay

## 2018-10-27 VITALS — BP 150/82 | HR 87 | Temp 98.3°F | Resp 18 | Ht 62.0 in | Wt 151.2 lb

## 2018-10-27 DIAGNOSIS — C7951 Secondary malignant neoplasm of bone: Secondary | ICD-10-CM | POA: Diagnosis present

## 2018-10-27 DIAGNOSIS — Z79899 Other long term (current) drug therapy: Secondary | ICD-10-CM | POA: Diagnosis not present

## 2018-10-27 DIAGNOSIS — Z923 Personal history of irradiation: Secondary | ICD-10-CM | POA: Insufficient documentation

## 2018-10-27 NOTE — Progress Notes (Signed)
Radiation Oncology         [phone] ________________________________  Name: Tammy Hensley MRN: [id number]  Date: 10/27/2018  DOB: 1941-07-19  Follow-Up Visit Note  CC: [name], MD  Everitt Amber, MD    ICD-10-CM   1. 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 Pelvis / 45 Gy in 25 fractions / IMRT 2. Vaginal Cuff / 18 Gy in 3 fractions / HDR, Ir-192  12/2005 - 01/2006: Left breast with boost / 62.4 Gy   Narrative:  The patient returns today for routine follow-up. She is accompanied by her son today. She reports improved left hip pain, noting it had resolved but returned after a period of inactivity caused by chemotherapy-related fatigue, and is able to ambulate without assistive device. She also reports improving fatigue. She is currently receiving chemotherapy under Dr. Alvy Bimler every 3 weeks, with her last infusion on 10/25/18.  She is noted to have swelling in bilateral ankles with slight pitting. She was given Lasix to take as needed on 10/18/18.                         ALLERGIES:  is allergic to calan [verapamil]; effexor [venlafaxine]; erythromycin; femara [letrozole]; fosamax [alendronate]; ibuprofen; paxil [paroxetine hcl]; remeron [mirtazapine]; tamoxifen; and vasotec [enalapril].  Meds: Current Outpatient Medications  Medication Sig Dispense Refill  . acetaminophen (TYLENOL) 500 MG tablet Take 500 mg by mouth daily as needed for moderate pain or headache.    . dexamethasone (DECADRON) 4 MG tablet Take 3 tabs at the night before and 3 tab the morning of chemotherapy, every 3 weeks, by mouth 36 tablet 0  . furosemide  (LASIX) 20 MG tablet Once daily for 3 days, repeat as needed for lower extremity edema 30 tablet 1  . Multiple Vitamin (MULTIVITAMIN WITH MINERALS) TABS tablet Take 1 tablet by mouth daily.    . ondansetron (ZOFRAN) 8 MG tablet Take 1 tablet (8 mg total) by mouth every 8 (eight) hours as needed for refractory nausea / vomiting. Start on day 3 after chemo. 30 tablet 1  . pravastatin (PRAVACHOL) 40 MG tablet Take 1 tablet (40 mg total) by mouth every evening. 90 tablet 1  . prochlorperazine (COMPAZINE) 10 MG tablet Take 1 tablet (10 mg total) by mouth every 6 (six) hours as needed (Nausea or vomiting). 30 tablet 1  . traMADol (ULTRAM) 50 MG tablet Take 1 tablet (50 mg total) by mouth 3 (three) times daily. Take 1/2 to 1 tablet every 4 hours as needed for severe Back Pain 90 tablet 0  . aspirin 81 MG tablet Take 81 mg by mouth daily.      . Calcium Carbonate-Vitamin D (CALCIUM 600 + D PO) Take 1 tablet by mouth 2 (two) times daily.     . Cholecalciferol (VITAMIN D) 2000 units CAPS Take 2,000 Units by mouth every other day.    . gabapentin (NEURONTIN) 300 MG capsule Take 1 capsule (300 mg total) by mouth 2 (two) times daily. (Patient not taking: Reported on 10/27/2018) 60 capsule 11  . levothyroxine (  SYNTHROID, LEVOTHROID) 88 MCG tablet Take 1 tablet (88 mcg total) by mouth daily before breakfast. (Patient not taking: Reported on 10/27/2018) 90 tablet 1  . lidocaine-prilocaine (EMLA) cream Apply to affected area once (Patient not taking: Reported on 10/18/2018) 30 g 3  . loratadine (CLARITIN) 10 MG tablet Take 10 mg by mouth daily as needed for allergies.    Marland Kitchen loratadine-pseudoephedrine (CLARITIN-D 24 HOUR) 10-240 MG 24 hr tablet Take 1 tablet by mouth daily as needed for allergies.    . metFORMIN (GLUCOPHAGE-XR) 500 MG 24 hr tablet TAKE ONE TABLET BY MOUTH WITH BREAKFAST AND LUNCH AND TAKE TWO TABLETS BY MOUTH WITH SUPPER FOR DAIBETES. (Patient not taking: Reported on 10/18/2018) 360 tablet 1   No current  facility-administered medications for this encounter.     Physical Findings: The patient is in no acute distress. Patient is alert and oriented.  height is 5\' 2"  (1.575 m) and weight is 151 lb 4 oz (68.6 kg). Her oral temperature is 98.3 F (36.8 C). Her blood pressure is 150/82 (abnormal) and her pulse is 87. Her respiration is 18 and oxygen saturation is 100%.  Lungs are clear to auscultation bilaterally. Heart has regular rate and rhythm. No palpable cervical, supraclavicular, or axillary adenopathy. Abdomen soft, non-tender, normal bowel sounds. Palpation along left pelvic area reveals no point tenderness.    Lab Findings: Lab Results  Component Value Date   WBC 5.3 10/25/2018   HGB 11.0 (L) 10/25/2018   HCT 35.1 (L) 10/25/2018   MCV 79.4 (L) 10/25/2018   PLT 359 10/25/2018    Radiographic Findings: Dg Knee 4 Views W/patella Right  Result Date: 10/04/2018 CLINICAL DATA:  [place] today onto right knee while undergoing chemotherapy, endometrial carcinoma, remote breast carcinoma EXAM: RIGHT KNEE - COMPLETE 4+ VIEW COMPARISON:  None. FINDINGS: For age there is only mild tricompartmental degenerative joint disease the right knee with some sclerosis and spurring primarily involving the lateral compartment. No fracture is seen and no joint effusion is noted. IMPRESSION: No acute abnormality. 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 Electronically Signed   By: Ivar Drape M.D.   On: 10/04/2018 16:02   [facility] Guided Port Insertion  Result Date: 09/29/2018 CLINICAL DATA:  Recurrent metastatic endometrial carcinoma and need for porta cath for chemotherapy. EXAM: IMPLANTED PORT A CATH PLACEMENT WITH ULTRASOUND AND FLUOROSCOPIC GUIDANCE ANESTHESIA/SEDATION: 2.0 mg IV Versed; 50 mcg IV Fentanyl Total Moderate Sedation Time:  38 minutes The patient's level of consciousness and physiologic status were continuously monitored during the procedure by Radiology nursing.  Additional Medications: 2 g IV Ancef. FLUOROSCOPY TIME:  18 seconds.  2.3 mGy. PROCEDURE: The procedure, risks, benefits, and alternatives were explained to the patient. Questions regarding the procedure were encouraged and answered. The patient understands and consents to the procedure. A time-out was performed prior to initiating the procedure. Ultrasound was utilized to confirm patency of the right internal jugular vein. The right neck and chest were prepped with chlorhexidine in a sterile fashion, and a sterile drape was applied covering the operative field. Maximum barrier sterile technique with sterile gowns and gloves were used for the procedure. Local anesthesia was provided with 1% lidocaine. After creating a small venotomy incision, a 21 gauge needle was advanced into the right internal jugular vein under direct, real-time ultrasound guidance. Ultrasound image documentation was performed. After securing guidewire access, an 8 Fr dilator was placed. A J-wire was kinked to measure appropriate catheter length. A subcutaneous port pocket  was then created along the upper chest wall utilizing sharp and blunt dissection. Portable cautery was utilized. The pocket was irrigated with sterile saline. A single lumen power injectable port was chosen for placement. The 8 Fr catheter was tunneled from the port pocket site to the venotomy incision. The port was placed in the pocket. External catheter was trimmed to appropriate length based on guidewire measurement. At the venotomy, an 8 Fr peel-away sheath was placed over a guidewire. The catheter was then placed through the sheath and the sheath removed. Final catheter positioning was confirmed and documented with a fluoroscopic spot image. The port was accessed with a needle and aspirated and flushed with heparinized saline. The access needle was removed. The venotomy and port pocket incisions were closed with subcutaneous 3-0 Monocryl and subcuticular 4-0 Vicryl.  Dermabond was applied to both incisions. COMPLICATIONS: COMPLICATIONS None FINDINGS: After catheter placement, the tip lies at the cavo-atrial junction. The catheter aspirates normally and is ready for immediate use. IMPRESSION: Placement of single lumen port a cath via right internal jugular vein. The catheter tip lies at the cavo-atrial junction. A power injectable port a cath was placed and is ready for immediate use. Electronically Signed   By: Aletta Edouard M.D.   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 Clinically stable. After palliative radiation therapy, she had great pain improvement in her pain in the left pelvis area and is off any pain medication at this time.   Plan:  PRN follow up in radiation oncology. Patient will continue close follow up with medical oncology and continue on chemotherapy.  -----------------------------------  Blair Promise, PhD, MD  This document serves as a record of services personally performed by [name], MD. It was created on his behalf by Wilburn Mylar, a trained medical scribe. The creation of this record is based on the scribe's personal observations and the provider's statements to them. This document has been checked and approved by the attending provider.

## 2018-10-27 NOTE — Progress Notes (Signed)
Pt presents today for f/u with Dr. Sondra Come. Pt with swollen BLE. Pt reports that she presented to symptom management clinic and received Lasix to take as needed for swelling. Pt with noticeable swelling in bilateral ankles, slight pitting, R>L. Pt reports fatigue is improving. Pt reports pain in hip is better; had resolved but pain returned after period of inactivity due to fatigue from chemotherapy. Pt is receiving chemotherapy every 3 weeks. Pt is now able to ambulate without assistive device. Pt denies diarrhea/constipation.   BP (!) 150/82 (BP Location: Right Arm, Patient Position: Sitting)   Pulse 87   Temp 98.3 F (36.8 C) (Oral)   Resp 18   Ht 5\' 2"  (1.575 m)   Wt 151 lb 4 oz (68.6 kg)   SpO2 100%   BMI 27.66 kg/m   Wt Readings from Last 3 Encounters:  10/27/18 151 lb 4 oz (68.6 kg)  10/25/18 148 lb 3.2 oz (67.2 kg)  10/18/18 148 lb 6.4 oz (67.3 kg)   Loma Sousa, RN BSN

## 2018-11-01 ENCOUNTER — Other Ambulatory Visit: Payer: Self-pay

## 2018-11-01 NOTE — Patient Outreach (Signed)
[facility]) Care Management  11/01/2018  Tammy Hensley [birth year] [id number]   Medication Adherence call to [name] spoke with patient she has been taken off all her medication including Metformin ER 500 mg because she is going through Chemotherapy.[name] is showing past due under [place].   [facility] Direct Dial [phone]  Fax [phone] [email or web address]

## 2018-11-10 ENCOUNTER — Ambulatory Visit: Payer: Self-pay | Admitting: Adult Health

## 2018-11-15 ENCOUNTER — Inpatient Hospital Stay: Payer: Medicare Other

## 2018-11-15 ENCOUNTER — Inpatient Hospital Stay (HOSPITAL_BASED_OUTPATIENT_CLINIC_OR_DEPARTMENT_OTHER): Payer: Medicare Other | Admitting: Hematology and Oncology

## 2018-11-15 ENCOUNTER — Telehealth: Payer: Self-pay | Admitting: Hematology and Oncology

## 2018-11-15 ENCOUNTER — Encounter: Payer: Self-pay | Admitting: Hematology and Oncology

## 2018-11-15 VITALS — BP 145/100 | HR 102 | Temp 98.1°F | Resp 18 | Ht 62.0 in | Wt 150.6 lb

## 2018-11-15 DIAGNOSIS — I10 Essential (primary) hypertension: Secondary | ICD-10-CM | POA: Diagnosis not present

## 2018-11-15 DIAGNOSIS — C7801 Secondary malignant neoplasm of right lung: Secondary | ICD-10-CM

## 2018-11-15 DIAGNOSIS — D509 Iron deficiency anemia, unspecified: Secondary | ICD-10-CM

## 2018-11-15 DIAGNOSIS — Z5111 Encounter for antineoplastic chemotherapy: Secondary | ICD-10-CM | POA: Diagnosis not present

## 2018-11-15 DIAGNOSIS — C7951 Secondary malignant neoplasm of bone: Secondary | ICD-10-CM

## 2018-11-15 DIAGNOSIS — C541 Malignant neoplasm of endometrium: Secondary | ICD-10-CM

## 2018-11-15 DIAGNOSIS — R5381 Other malaise: Secondary | ICD-10-CM

## 2018-11-15 LAB — CBC WITH DIFFERENTIAL (CANCER CENTER ONLY)
ABS IMMATURE GRANULOCYTES: 0.02 10*3/uL (ref 0.00–0.07)
Basophils Absolute: 0 10*3/uL (ref 0.0–0.1)
Basophils Relative: 0 %
Eosinophils Absolute: 0 10*3/uL (ref 0.0–0.5)
Eosinophils Relative: 1 %
HCT: 34.2 % — ABNORMAL LOW (ref 36.0–46.0)
Hemoglobin: 10.8 g/dL — ABNORMAL LOW (ref 12.0–15.0)
Immature Granulocytes: 0 %
Lymphocytes Relative: 7 %
Lymphs Abs: 0.4 10*3/uL — ABNORMAL LOW (ref 0.7–4.0)
MCH: 24.5 pg — ABNORMAL LOW (ref 26.0–34.0)
MCHC: 31.6 g/dL (ref 30.0–36.0)
MCV: 77.6 fL — ABNORMAL LOW (ref 80.0–100.0)
Monocytes Absolute: 0.1 10*3/uL (ref 0.1–1.0)
Monocytes Relative: 2 %
Neutro Abs: 5.2 10*3/uL (ref 1.7–7.7)
Neutrophils Relative %: 90 %
Platelet Count: 364 10*3/uL (ref 150–400)
RBC: 4.41 MIL/uL (ref 3.87–5.11)
RDW: 16.4 % — ABNORMAL HIGH (ref 11.5–15.5)
WBC Count: 5.7 10*3/uL (ref 4.0–10.5)
nRBC: 0 % (ref 0.0–0.2)

## 2018-11-15 LAB — CMP (CANCER CENTER ONLY)
ALT: 9 U/L (ref 0–44)
AST: 12 U/L — ABNORMAL LOW (ref 15–41)
Albumin: 3.3 g/dL — ABNORMAL LOW (ref 3.5–5.0)
Alkaline Phosphatase: 91 U/L (ref 38–126)
Anion gap: 11 (ref 5–15)
BUN: 14 mg/dL (ref 8–23)
CO2: 27 mmol/L (ref 22–32)
CREATININE: 0.78 mg/dL (ref 0.44–1.00)
Calcium: 9.7 mg/dL (ref 8.9–10.3)
Chloride: 102 mmol/L (ref 98–111)
GFR, Est AFR Am: >60 mL/min (ref >60)
GFR, Estimated: >60 mL/min (ref >60)
Glucose, Bld: 233 mg/dL — ABNORMAL HIGH (ref 70–99)
Potassium: 3.7 mmol/L (ref 3.5–5.1)
Sodium: 140 mmol/L (ref 135–145)
Total Bilirubin: 0.3 mg/dL (ref 0.3–1.2)
Total Protein: 6.9 g/dL (ref 6.5–8.1)

## 2018-11-15 LAB — IRON AND TIBC
Iron: 43 ug/dL (ref 41–142)
Saturation Ratios: 14 % — ABNORMAL LOW (ref 21–57)
TIBC: 295 ug/dL (ref 236–444)
UIBC: 252 ug/dL (ref 120–384)

## 2018-11-15 LAB — MAGNESIUM: Magnesium: 1.8 mg/dL (ref 1.7–2.4)

## 2018-11-15 LAB — FERRITIN: Ferritin: 80 ng/mL (ref 11–307)

## 2018-11-15 MED ORDER — FAMOTIDINE IN NACL 20-0.9 MG/50ML-% IV SOLN
INTRAVENOUS | Status: AC
  Filled 2018-11-15: qty 50

## 2018-11-15 MED ORDER — PALONOSETRON HCL INJECTION 0.25 MG/5ML
INTRAVENOUS | Status: AC
  Filled 2018-11-15: qty 5

## 2018-11-15 MED ORDER — HEPARIN SOD (PORK) LOCK FLUSH 100 UNIT/ML IV SOLN
500.0000 [IU] | Freq: Once | INTRAVENOUS | Status: AC | PRN
  Administered 2018-11-15: 500 [IU]
  Filled 2018-11-15: qty 5

## 2018-11-15 MED ORDER — SODIUM CHLORIDE 0.9 % IV SOLN
364.5000 mg | Freq: Once | INTRAVENOUS | Status: AC
  Administered 2018-11-15: 360 mg via INTRAVENOUS
  Filled 2018-11-15: qty 36

## 2018-11-15 MED ORDER — DIPHENHYDRAMINE HCL 50 MG/ML IJ SOLN
INTRAMUSCULAR | Status: AC
  Filled 2018-11-15: qty 1

## 2018-11-15 MED ORDER — SODIUM CHLORIDE 0.9 % IV SOLN
25.0000 mg | Freq: Once | INTRAVENOUS | Status: AC
  Administered 2018-11-15: 25 mg via INTRAVENOUS
  Filled 2018-11-15: qty 0.5

## 2018-11-15 MED ORDER — SODIUM CHLORIDE 0.9% FLUSH
10.0000 mL | INTRAVENOUS | Status: DC | PRN
  Administered 2018-11-15: 10 mL
  Filled 2018-11-15: qty 10

## 2018-11-15 MED ORDER — FAMOTIDINE IN NACL 20-0.9 MG/50ML-% IV SOLN
20.0000 mg | Freq: Once | INTRAVENOUS | Status: AC
  Administered 2018-11-15: 20 mg via INTRAVENOUS

## 2018-11-15 MED ORDER — SODIUM CHLORIDE 0.9 % IV SOLN
131.2500 mg/m2 | Freq: Once | INTRAVENOUS | Status: AC
  Administered 2018-11-15: 222 mg via INTRAVENOUS
  Filled 2018-11-15: qty 37

## 2018-11-15 MED ORDER — SODIUM CHLORIDE 0.9 % IV SOLN
Freq: Once | INTRAVENOUS | Status: AC
  Administered 2018-11-15: 10:00:00 via INTRAVENOUS
  Filled 2018-11-15: qty 250

## 2018-11-15 MED ORDER — PALONOSETRON HCL INJECTION 0.25 MG/5ML
0.2500 mg | Freq: Once | INTRAVENOUS | Status: AC
  Administered 2018-11-15: 0.25 mg via INTRAVENOUS

## 2018-11-15 MED ORDER — SODIUM CHLORIDE 0.9 % IV SOLN
Freq: Once | INTRAVENOUS | Status: AC
  Administered 2018-11-15: 11:00:00 via INTRAVENOUS
  Filled 2018-11-15: qty 5

## 2018-11-15 MED ORDER — SODIUM CHLORIDE 0.9% FLUSH
10.0000 mL | Freq: Once | INTRAVENOUS | Status: AC
  Administered 2018-11-15: 10 mL
  Filled 2018-11-15: qty 10

## 2018-11-15 NOTE — Assessment & Plan Note (Signed)
She has signs of physical deconditioning I recommend physical therapy as tolerated and increase activity as tolerated

## 2018-11-15 NOTE — Assessment & Plan Note (Signed)
She is noted to have progressive hypertension with fluid retention I recommend her to check her blood pressure at home twice a day If her blood pressure remain elevated at home, I will start her back on her blood pressure medications

## 2018-11-15 NOTE — Patient Instructions (Signed)
   [facility] Discharge Instructions for Patients Receiving Chemotherapy  Today you received the following chemotherapy agents Taxol and Carboplatin   To help prevent nausea and vomiting after your treatment, we encourage you to take your nausea medication as directed.    If you develop nausea and vomiting that is not controlled by your nausea medication, call the clinic.   BELOW ARE SYMPTOMS THAT SHOULD BE REPORTED IMMEDIATELY:  *FEVER GREATER THAN 100.5 F  *CHILLS WITH OR WITHOUT FEVER  NAUSEA AND VOMITING THAT IS NOT CONTROLLED WITH YOUR NAUSEA MEDICATION  *UNUSUAL SHORTNESS OF BREATH  *UNUSUAL BRUISING OR BLEEDING  TENDERNESS IN MOUTH AND THROAT WITH OR WITHOUT PRESENCE OF ULCERS  *URINARY PROBLEMS  *BOWEL PROBLEMS  UNUSUAL RASH Items with * indicate a potential emergency and should be followed up as soon as possible.  Feel free to call the clinic should you have any questions or concerns. The clinic phone number is [phone].  Please show the CHEMO ALERT CARD at check-in to the Emergency Department and triage nurse.   

## 2018-11-15 NOTE — Progress Notes (Signed)
[place] OFFICE PROGRESS NOTE  Patient Care Team: [name], MD as PCP - General (Internal Medicine) [name], MD as Consulting Physician (Gastroenterology) [name], MD as Consulting Physician (Ophthalmology) [name], [name], MD as Consulting Physician (Obstetrics and Gynecology) [name], MD as Consulting Physician (Hematology and Oncology) [name], MD as Consulting Physician (Radiation Oncology)  ASSESSMENT & PLAN:  Endometrial cancer [facility]) She tolerated treatment well except and noticed some generalized deconditioning due to lack of activity, fluid retention and mild hypertension We will proceed with treatment as scheduled I plan to repeat imaging study in 3 weeks for objective assessment of response to therapy  Metastasis to bone [facility]) She has intermittent bone pain with activity and has not been participating with physical therapy to avoid pain I recommend scheduled Tylenol if needed and tramadol as needed to control her pain  Metastasis to lung [facility]) She is not symptomatic.  Will order CT imaging in 3 weeks for objective assessment of response to therapy  Physical debility She has signs of physical deconditioning I recommend physical therapy as tolerated and increase activity as tolerated  Hypertension She is noted to have progressive hypertension with fluid retention I recommend her to check her blood pressure at home twice a day If her blood pressure remain elevated at home, I will start her back on her blood pressure medications   Orders Placed This Encounter  Procedures  . CT CHEST W CONTRAST    Standing Status:   Future    Standing Expiration Date:   11/16/2019    Order Specific Question:   If indicated for the ordered procedure, I authorize the administration of contrast media per Radiology protocol    Answer:   Yes    Order Specific Question:   Preferred imaging location?    Answer:   [facility]    Order  Specific Question:   Radiology Contrast Protocol - do NOT remove file path    Answer:   [email or web address]  . CT ABDOMEN PELVIS W CONTRAST    Standing Status:   Future    Standing Expiration Date:   11/16/2019    Order Specific Question:   If indicated for the ordered procedure, I authorize the administration of contrast media per Radiology protocol    Answer:   Yes    Order Specific Question:   Preferred imaging location?    Answer:   [facility]    Order Specific Question:   Radiology Contrast Protocol - do NOT remove file path    Answer:   [email or web address]    INTERVAL HISTORY: Please see below for problem oriented charting. 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 SEE COMMENT.  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 The  greatest extent of invasive carcinoma as measured on the needle core biopsy measures 0.6 cm.       Endometrial cancer ([place])   02/03/2002 Pathology Results    1. BENIGN ENDOMETRIAL POLYPS.  2. UTERINE FIBROIDS: PORTIONS OF SMOOTH MUSCLE CONSISTENT WITH BENIGN LEIOMYOMA(S). PORTIONS OF BENIGN, CYSTICALLY ATROPHIC ENDOMETRIUM WITHOUT  HYPERPLASIA OR EVIDENCE OF MALIGNANCY. 3. ENDOMETRIAL CURETTAGE: BENIGN ENDOMETRIUM AND SMOOTH MUSCLE.    09/07/2016 Pathology Results    PAP smear positive for malignant cells    09/07/2016 Initial Diagnosis    Patient had postmenopausal bleeding in 08-2016. She had PAP 09-07-16 by PCP Dr [name], which documented carcinoma. She had evaluation by Dr [name] with colposcopy/ ECC/endometrial biopsy documenting serous endometrioid endometrial carcinoma.    09/15/2016 Imaging    Ct imaging showed markedly thickened and heterogeneous endometrial stripe suspicious for endometrial carcinoma in this patient with postmenopausal vaginal bleeding. Cystic lesion in the right ovary cannot be definitively characterized. Pelvic ultrasound is recommended for further evaluation. Aortoiliac atherosclerosis. Avascular necrosis of the femoral heads bilaterally.    10/07/2016 Tumor Marker    Patient's tumor was tested for the following markers: CA125 Results of the tumor marker test revealed 15.1    10/13/2016 Pathology Results    1. Lymph node, sentinel, biopsy, right obturator - THERE IS NO EVIDENCE OF CARCINOMA IN 8 OF 8 LYMPH NODES (0/8). - SEE COMMENT. 2. Lymph node, sentinel, biopsy, left obturator - THERE IS NO EVIDENCE OF CARCINOMA IN 1 OF 1 LYMPH NODE (0/1). - SEE COMMENT. 3. Lymph node, sentinel, biopsy, left para-aortic - THERE IS NO EVIDENCE OF CARCINOMA IN 4 OF 4 LYMPH NODES (0/4). - SEE COMMENT. 4. Uterus +/- tubes/ovaries, neoplastic, cervix - INVASIVE MIXED SEROUS/ENDOMETRIOID ADENOCARCINOMA, MULTIPLE FOCI, FIGO GRADE III, THE LARGEST FOCUS SPANS 8.2 CM. - ADENOCARCINOMA EXTENDS INTO THE OUTER HALF OF THE MYOMETRIUM AND INVOLVES THE STROMA OF THE LOWER UTERINE SEGMENT AND CERVIX. - LYMPHOVASCULAR INVASION IS IDENTIFIED. - THE SURGICAL RESECTION MARGINS ARE NEGATIVE FOR CARCINOMA. - SEE ONCOLOGY TABLE BELOW. ADDITIONAL FINDINGS: - ENDOMETRIUM: ENDOMETRIOID TYPE POLYP(S), WHICH  CONTAIN FOCI OF SIMILAR APPEARING MIXED SEROUS/ENDOMETRIOID ADENOCARCINOMA. - MYOMETRIUM: LEIOMYOMATA. - SEROSA: UNREMARKABLE. - BILATERAL ADNEXA: BENIGN OVARIES AND FALLOPIAN TUBES.    10/13/2016 Surgery    Surgery: Total robotic hysterectomy bilateral salpingo-oophorectomy, bilateral pelvic sentinel lymph node removal, left PA sentinel lymph node removal. Mini-laparotomy for specimen removal Surgeons:  [name], MD; [name], MD  Pathology:  1)Uterus, cervix, bilateral tubes and ovaries 2) Right obturator SLN 3) Left Obturator SLN 4) Left PA SLN Operative findings: 14 week fibroid uterus with one large dominant 6 cm myoma that was palpably calcified. 4 cm right ovarian cyst. + SLN identified in the bilateral obturator spaces, + SLN identified in the left PA space.     12/01/2016 - 05/31/2017 Chemotherapy    She received carboplatin & Taxol. She had 3 cycles of chemo followed by radiation and then 3 more cycles of chemo    01/11/2017 Genetic Testing    Testing was normal and did not reveal a mutation in these genes: The genes tested were the 80 genes on [employer]'s Multi-Cancer panel (ALK, APC, ATM, AXIN2, BAP1, BARD1, BLM, BMPR1A, BRCA1, BRCA2, BRIP1, CASR, CDC73, CDH1, CDK4, CDKN1B, CDKN1C, CDKN2A, CEBPA, CHEK2, DICER1, DIS3L2, EGFR, EPCAM, FH, FLCN, GATA2, GPC3, GREM1, HOXB13,  HRAS, KIT, MAX, MEN1, MET, MITF, MLH1, MSH2, MSH6, MUTYH, NBN, NF1, NF2, PALB2, PDGFRA, PHOX2B, PMS2, POLD1, POLE, POT1, PRKAR1A, PTCH1, PTEN, RAD50, RAD51C, RAD51D, RB1, RECQL4,  RET, RUNX1, SDHA, SDHAF2, SDHB, SDHC, SDHD, SMAD4, SMARCA4, SMARCB1, SMARCE1, STK11, SUFU, TERC, TERT, TMEM127, TP53, TSC1, TSC2, VHL, WRN, and WT1).    02/10/2017 - 04/12/2017 Radiation Therapy    02/10/17-03/16/17; 03/29/17-04/12/17;  1) Pelvis/ 45 Gy in 25 fractions  2) Vaginal Cuff/ 18 Gy in 3 fractions    08/26/2018 Imaging    Large destructive mass lesion left iliac bone with large soft tissue mass extending into the  posterior soft tissues and pelvis compatible with metastatic disease. Consider follow-up CT chest abdomen pelvis with contrast for further staging.  Radiation changes at L5 and the sacrum. No lumbar metastatic deposits  Multilevel degenerative changes above.    08/29/2018 Imaging    1. 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 2. New irregular nodular opacity in the medial right middle lobe, new since 2017 CT. Separate subcentimeter right upper lobe solid pulmonary nodule, for which no comparison exists. These findings are indeterminate for pulmonary metastases and attention on follow-up chest CT in 3 months is recommended. 3. No lymphadenopathy or additional findings of metastatic disease.No tumor recurrence at the hysterectomy margin. 4. Aortic Atherosclerosis (ICD10-I70.0). Additional chronic findings as detailed.    08/29/2018 Tumor Marker    Patient's tumor was tested for the following markers: CA-125 Results of the tumor marker test revealed 17.4    09/06/2018 Procedure    CT-guided core biopsy performed of huge mass in the left iliac fossa destroying the left iliac bone.    09/06/2018 Pathology Results    Soft Tissue Needle Core Biopsy, Left Iliac Fossa - POORLY DIFFERENTIATED MALIGNANT NEOPLASM. Microscopic Comment The provided clinical history of both endometrial and breast cancer is noted. In the current case, immunohistochemistry for qualitative ER demonstrates scattered positive staining. CK8/18 is focally positive. CKAE1/3, CK7, CK20, TTF-1, CDX-2, GATA3 and PAX 8 are negative. The immunophenotype is not specific as to an origin for this poorly differentiated malignant neoplasm. The morphology is more suggestive of endometrial cancer than breast cancer, and may represent an unrecognized carcinosarcomatous component.    09/08/2018 - 09/27/2018 Radiation Therapy    She had palliative radiation treatment    09/29/2018 Procedure     Placement of single lumen port a cath via right internal jugular vein. The catheter tip lies at the cavo-atrial junction. A power injectable port a cath was placed and is ready for immediate use.    10/04/2018 -  Chemotherapy    The patient had carboplatin and taxol     Metastasis to bone ([place])   08/30/2018 Initial Diagnosis    Metastasis to bone ([place])    10/04/2018 -  Chemotherapy    The patient had palonosetron (ALOXI) injection 0.25 mg, 0.25 mg, Intravenous,  Once, 3 of 4 cycles Administration: 0.25 mg (10/04/2018), 0.25 mg (10/25/2018) CARBOplatin (PARAPLATIN) 360 mg in sodium chloride 0.9 % 250 mL chemo infusion, 360 mg (100 % of original dose 364.5 mg), Intravenous,  Once, 3 of 4 cycles Dose modification:   (original dose 364.5 mg, Cycle 1) Administration: 360 mg (10/04/2018), 360 mg (10/25/2018) PACLitaxel (TAXOL) 222 mg in sodium chloride 0.9 % 250 mL chemo infusion (> '80mg'$ /m2), 131.25 mg/m2 = 222 mg (75 % of original dose 175 mg/m2), Intravenous,  Once, 3 of 4 cycles Dose modification: 131.25 mg/m2 (75 % of original dose 175 mg/m2, Cycle 1, Reason: Dose Not Tolerated) Administration: 222 mg (10/04/2018), 222 mg (10/25/2018) fosaprepitant (EMEND) 150 mg, dexamethasone (DECADRON) 12 mg in sodium chloride 0.9 % 145  mL IVPB, , Intravenous,  Once, 3 of 4 cycles Administration:  (10/04/2018),  (10/25/2018)  for chemotherapy treatment.      REVIEW OF SYSTEMS:   Constitutional: Denies fevers, chills or abnormal weight loss Eyes: Denies blurriness of vision Ears, nose, mouth, throat, and face: Denies mucositis or sore throat Respiratory: Denies cough, dyspnea or wheezes Cardiovascular: Denies palpitation, chest discomfort  Gastrointestinal:  Denies nausea, heartburn or change in bowel habits Skin: Denies abnormal skin rashes Lymphatics: Denies new lymphadenopathy or easy bruising Behavioral/Psych: Mood is stable, no new changes  All other systems were reviewed with the patient and are  negative.  I have reviewed the past medical history, past surgical history, social history and family history with the patient and they are unchanged from previous note.  ALLERGIES:  is allergic to calan [verapamil]; effexor [venlafaxine]; erythromycin; femara [letrozole]; fosamax [alendronate]; ibuprofen; paxil [paroxetine hcl]; remeron [mirtazapine]; tamoxifen; and vasotec [enalapril].  MEDICATIONS:  Current Outpatient Medications  Medication Sig Dispense Refill  . acetaminophen (TYLENOL) 500 MG tablet Take 500 mg by mouth daily as needed for moderate pain or headache.    Marland Kitchen aspirin 81 MG tablet Take 81 mg by mouth daily.      . Calcium Carbonate-Vitamin D (CALCIUM 600 + D PO) Take 1 tablet by mouth 2 (two) times daily.     . Cholecalciferol (VITAMIN D) 2000 units CAPS Take 2,000 Units by mouth every other day.    Marland Kitchen dexamethasone (DECADRON) 4 MG tablet Take 3 tabs at the night before and 3 tab the morning of chemotherapy, every 3 weeks, by mouth 36 tablet 0  . furosemide (LASIX) 20 MG tablet Once daily for 3 days, repeat as needed for lower extremity edema 30 tablet 1  . gabapentin (NEURONTIN) 300 MG capsule Take 1 capsule (300 mg total) by mouth 2 (two) times daily. (Patient not taking: Reported on 10/27/2018) 60 capsule 11  . levothyroxine (SYNTHROID, LEVOTHROID) 88 MCG tablet Take 1 tablet (88 mcg total) by mouth daily before breakfast. (Patient not taking: Reported on 10/27/2018) 90 tablet 1  . lidocaine-prilocaine (EMLA) cream Apply to affected area once (Patient not taking: Reported on 10/18/2018) 30 g 3  . loratadine (CLARITIN) 10 MG tablet Take 10 mg by mouth daily as needed for allergies.    Marland Kitchen loratadine-pseudoephedrine (CLARITIN-D 24 HOUR) 10-240 MG 24 hr tablet Take 1 tablet by mouth daily as needed for allergies.    . metFORMIN (GLUCOPHAGE-XR) 500 MG 24 hr tablet TAKE ONE TABLET BY MOUTH WITH BREAKFAST AND LUNCH AND TAKE TWO TABLETS BY MOUTH WITH SUPPER FOR DAIBETES. (Patient not  taking: Reported on 10/18/2018) 360 tablet 1  . Multiple Vitamin (MULTIVITAMIN WITH MINERALS) TABS tablet Take 1 tablet by mouth daily.    . ondansetron (ZOFRAN) 8 MG tablet Take 1 tablet (8 mg total) by mouth every 8 (eight) hours as needed for refractory nausea / vomiting. Start on day 3 after chemo. 30 tablet 1  . pravastatin (PRAVACHOL) 40 MG tablet Take 1 tablet (40 mg total) by mouth every evening. 90 tablet 1  . prochlorperazine (COMPAZINE) 10 MG tablet Take 1 tablet (10 mg total) by mouth every 6 (six) hours as needed (Nausea or vomiting). 30 tablet 1  . traMADol (ULTRAM) 50 MG tablet Take 1 tablet (50 mg total) by mouth 3 (three) times daily. Take 1/2 to 1 tablet every 4 hours as needed for severe Back Pain 90 tablet 0   No current facility-administered medications for this visit.  Facility-Administered Medications Ordered in Other Visits  Medication Dose Route Frequency Provider Last Rate Last Dose  . CARBOplatin (PARAPLATIN) 360 mg in sodium chloride 0.9 % 100 mL chemo infusion  360 mg Intravenous Once Alvy Bimler, Kalyssa Anker, MD      . diphenhydrAMINE (BENADRYL) 25 mg in sodium chloride 0.9 % 50 mL IVPB  25 mg Intravenous Once Alvy Bimler, Deamonte Sayegh, MD      . famotidine (PEPCID) IVPB 20 mg premix  20 mg Intravenous Once Alvy Bimler, Averlee Swartz, MD      . fosaprepitant (EMEND) 150 mg, dexamethasone (DECADRON) 12 mg in sodium chloride 0.9 % 145 mL IVPB   Intravenous Once Alvy Bimler, Ranee Peasley, MD      . heparin lock flush 100 unit/mL  500 Units Intracatheter Once PRN Alvy Bimler, Darcel Frane, MD      . PACLitaxel (TAXOL) 222 mg in sodium chloride 0.9 % 250 mL chemo infusion (> '80mg'$ /m2)  131.25 mg/m2 (Treatment Plan Recorded) Intravenous Once Alvy Bimler, Daiquan Resnik, MD      . palonosetron (ALOXI) injection 0.25 mg  0.25 mg Intravenous Once Analee Montee, MD      . sodium chloride flush (NS) 0.9 % injection 10 mL  10 mL Intracatheter PRN Alvy Bimler, Mry Lamia, MD        PHYSICAL EXAMINATION: ECOG PERFORMANCE STATUS: 1 - Symptomatic but completely  ambulatory  Vitals:   11/15/18 0918  BP: (!) 145/100  Pulse: (!) 102  Resp: 18  Temp: 98.1 F (36.7 C)  SpO2: 100%   Filed Weights   11/15/18 0918  Weight: 150 lb 9.6 oz (68.3 kg)    GENERAL:alert, no distress and comfortable SKIN: skin color, texture, turgor are normal, no rashes or significant lesions EYES: normal, Conjunctiva are pink and non-injected, sclera clear OROPHARYNX:no exudate, no erythema and lips, buccal mucosa, and tongue normal  NECK: supple, thyroid normal size, non-tender, without nodularity LYMPH:  no palpable lymphadenopathy in the cervical, axillary or inguinal LUNGS: clear to auscultation and percussion with normal breathing effort HEART: regular rate & rhythm and no murmurs with mild bilateral lower extremity edema ABDOMEN:abdomen soft, non-tender and normal bowel sounds Musculoskeletal:no cyanosis of digits and no clubbing  NEURO: alert & oriented x 3 with fluent speech, no focal motor/sensory deficits  LABORATORY DATA:  I have reviewed the data as listed    Component Value Date/Time   NA 140 11/15/2018 0847   NA 137 05/31/2017 0931   K 3.7 11/15/2018 0847   K 3.5 05/31/2017 0931   CL 102 11/15/2018 0847   CL 99 12/20/2012 1336   CO2 27 11/15/2018 0847   CO2 25 05/31/2017 0931   GLUCOSE 233 (H) 11/15/2018 0847   GLUCOSE 161 (H) 05/31/2017 0931   GLUCOSE 110 (H) 12/20/2012 1336   BUN 14 11/15/2018 0847   BUN 10.9 05/31/2017 0931   CREATININE 0.78 11/15/2018 0847   CREATININE 0.63 07/05/2018 1009   CREATININE 0.8 05/31/2017 0931   CALCIUM 9.7 11/15/2018 0847   CALCIUM 10.6 (H) 05/31/2017 0931   PROT 6.9 11/15/2018 0847   PROT 7.5 05/31/2017 0931   ALBUMIN 3.3 (L) 11/15/2018 0847   ALBUMIN 4.2 05/31/2017 0931   AST 12 (L) 11/15/2018 0847   AST 16 05/31/2017 0931   ALT 9 11/15/2018 0847   ALT 15 05/31/2017 0931   ALKPHOS 91 11/15/2018 0847   ALKPHOS 91 05/31/2017 0931   BILITOT 0.3 11/15/2018 0847   BILITOT 0.33 05/31/2017 0931    GFRNONAA >60 11/15/2018 0847   GFRNONAA 87 07/05/2018 1009   GFRAA >  60 11/15/2018 0847   GFRAA 100 07/05/2018 1009    No results found for: SPEP, UPEP  Lab Results  Component Value Date   WBC 5.7 11/15/2018   NEUTROABS 5.2 11/15/2018   HGB 10.8 (L) 11/15/2018   HCT 34.2 (L) 11/15/2018   MCV 77.6 (L) 11/15/2018   PLT 364 11/15/2018      Chemistry      Component Value Date/Time   NA 140 11/15/2018 0847   NA 137 05/31/2017 0931   K 3.7 11/15/2018 0847   K 3.5 05/31/2017 0931   CL 102 11/15/2018 0847   CL 99 12/20/2012 1336   CO2 27 11/15/2018 0847   CO2 25 05/31/2017 0931   BUN 14 11/15/2018 0847   BUN 10.9 05/31/2017 0931   CREATININE 0.78 11/15/2018 0847   CREATININE 0.63 07/05/2018 1009   CREATININE 0.8 05/31/2017 0931      Component Value Date/Time   CALCIUM 9.7 11/15/2018 0847   CALCIUM 10.6 (H) 05/31/2017 0931   ALKPHOS 91 11/15/2018 0847   ALKPHOS 91 05/31/2017 0931   AST 12 (L) 11/15/2018 0847   AST 16 05/31/2017 0931   ALT 9 11/15/2018 0847   ALT 15 05/31/2017 0931   BILITOT 0.3 11/15/2018 0847   BILITOT 0.33 05/31/2017 0931      All questions were answered. The patient knows to call the clinic with any problems, questions or concerns. No barriers to learning was detected.  I spent 25 minutes counseling the patient face to face. The total time spent in the appointment was 30 minutes and more than 50% was on counseling and review of test results  [name], MD 11/15/2018 9:51 AM

## 2018-11-15 NOTE — Assessment & Plan Note (Signed)
She has intermittent bone pain with activity and has not been participating with physical therapy to avoid pain I recommend scheduled Tylenol if needed and tramadol as needed to control her pain

## 2018-11-15 NOTE — Telephone Encounter (Signed)
Per 12/24 no new orders

## 2018-11-15 NOTE — Assessment & Plan Note (Signed)
She tolerated treatment well except and noticed some generalized deconditioning due to lack of activity, fluid retention and mild hypertension We will proceed with treatment as scheduled I plan to repeat imaging study in 3 weeks for objective assessment of response to therapy

## 2018-11-15 NOTE — Assessment & Plan Note (Signed)
She is not symptomatic.  Will order CT imaging in 3 weeks for objective assessment of response to therapy

## 2018-11-30 ENCOUNTER — Telehealth: Payer: Self-pay | Admitting: Oncology

## 2018-11-30 NOTE — Telephone Encounter (Signed)
Tammy Hensley called and requested a refill of Tramadol. She said it is helping her to be able to walk everyday.

## 2018-12-01 ENCOUNTER — Other Ambulatory Visit: Payer: Self-pay | Admitting: Hematology and Oncology

## 2018-12-01 DIAGNOSIS — M5442 Lumbago with sciatica, left side: Secondary | ICD-10-CM

## 2018-12-01 MED ORDER — TRAMADOL HCL 50 MG PO TABS: 50.0000 mg | ORAL_TABLET | Freq: Three times a day (TID) | ORAL | 0 refills | Status: DC

## 2018-12-01 NOTE — Telephone Encounter (Signed)
done

## 2018-12-02 ENCOUNTER — Other Ambulatory Visit: Payer: Self-pay | Admitting: *Deleted

## 2018-12-05 ENCOUNTER — Inpatient Hospital Stay: Payer: Medicare Other | Attending: Hematology and Oncology

## 2018-12-05 ENCOUNTER — Ambulatory Visit (HOSPITAL_COMMUNITY)
Admission: RE | Admit: 2018-12-05 | Discharge: 2018-12-05 | Disposition: A | Discharge status: Home / Self Care | Payer: Medicare Other | Source: Ambulatory Visit | Attending: Hematology and Oncology | Admitting: Hematology and Oncology

## 2018-12-05 ENCOUNTER — Other Ambulatory Visit: Payer: Medicare Other

## 2018-12-05 ENCOUNTER — Inpatient Hospital Stay: Payer: Medicare Other

## 2018-12-05 ENCOUNTER — Encounter (HOSPITAL_COMMUNITY): Payer: Self-pay

## 2018-12-05 DIAGNOSIS — C541 Malignant neoplasm of endometrium: Secondary | ICD-10-CM | POA: Diagnosis not present

## 2018-12-05 DIAGNOSIS — C7951 Secondary malignant neoplasm of bone: Secondary | ICD-10-CM | POA: Insufficient documentation

## 2018-12-05 DIAGNOSIS — D61818 Other pancytopenia: Secondary | ICD-10-CM | POA: Diagnosis not present

## 2018-12-05 DIAGNOSIS — R600 Localized edema: Secondary | ICD-10-CM | POA: Diagnosis not present

## 2018-12-05 DIAGNOSIS — C7801 Secondary malignant neoplasm of right lung: Secondary | ICD-10-CM

## 2018-12-05 DIAGNOSIS — Z5111 Encounter for antineoplastic chemotherapy: Secondary | ICD-10-CM | POA: Insufficient documentation

## 2018-12-05 DIAGNOSIS — I10 Essential (primary) hypertension: Secondary | ICD-10-CM | POA: Insufficient documentation

## 2018-12-05 DIAGNOSIS — M545 Low back pain: Secondary | ICD-10-CM | POA: Diagnosis not present

## 2018-12-05 HISTORY — DX: Secondary malignant neoplasm of bone: C79.51

## 2018-12-05 HISTORY — DX: Malignant neoplasm of endometrium: C54.1

## 2018-12-05 LAB — CBC WITH DIFFERENTIAL (CANCER CENTER ONLY)
ABS IMMATURE GRANULOCYTES: 0.01 10*3/uL (ref 0.00–0.07)
Basophils Absolute: 0 10*3/uL (ref 0.0–0.1)
Basophils Relative: 1 %
Eosinophils Absolute: 0 10*3/uL (ref 0.0–0.5)
Eosinophils Relative: 0 %
HCT: 33.4 % — ABNORMAL LOW (ref 36.0–46.0)
Hemoglobin: 10.4 g/dL — ABNORMAL LOW (ref 12.0–15.0)
Immature Granulocytes: 0 %
LYMPHS ABS: 0.6 10*3/uL — AB (ref 0.7–4.0)
LYMPHS PCT: 16 %
MCH: 24.8 pg — ABNORMAL LOW (ref 26.0–34.0)
MCHC: 31.1 g/dL (ref 30.0–36.0)
MCV: 79.7 fL — ABNORMAL LOW (ref 80.0–100.0)
Monocytes Absolute: 0.5 10*3/uL (ref 0.1–1.0)
Monocytes Relative: 14 %
Neutro Abs: 2.7 10*3/uL (ref 1.7–7.7)
Neutrophils Relative %: 69 %
Platelet Count: 349 10*3/uL (ref 150–400)
RBC: 4.19 MIL/uL (ref 3.87–5.11)
RDW: 17.2 % — ABNORMAL HIGH (ref 11.5–15.5)
WBC Count: 3.8 10*3/uL — ABNORMAL LOW (ref 4.0–10.5)
nRBC: 0 % (ref 0.0–0.2)

## 2018-12-05 LAB — CMP (CANCER CENTER ONLY)
ALT: 7 U/L (ref 0–44)
AST: 12 U/L — ABNORMAL LOW (ref 15–41)
Albumin: 3.4 g/dL — ABNORMAL LOW (ref 3.5–5.0)
Alkaline Phosphatase: 81 U/L (ref 38–126)
Anion gap: 9 (ref 5–15)
BILIRUBIN TOTAL: 0.3 mg/dL (ref 0.3–1.2)
BUN: 10 mg/dL (ref 8–23)
CO2: 30 mmol/L (ref 22–32)
Calcium: 9.5 mg/dL (ref 8.9–10.3)
Chloride: 101 mmol/L (ref 98–111)
Creatinine: 0.67 mg/dL (ref 0.44–1.00)
GFR, Est AFR Am: >60 mL/min (ref >60)
Glucose, Bld: 103 mg/dL — ABNORMAL HIGH (ref 70–99)
Potassium: 3.6 mmol/L (ref 3.5–5.1)
Sodium: 140 mmol/L (ref 135–145)
Total Protein: 6.7 g/dL (ref 6.5–8.1)

## 2018-12-05 MED ORDER — IOHEXOL 300 MG/ML  SOLN
100.0000 mL | Freq: Once | INTRAMUSCULAR | Status: AC | PRN
  Administered 2018-12-05: 100 mL via INTRAVENOUS

## 2018-12-05 MED ORDER — DIPHENHYDRAMINE HCL 25 MG PO CAPS
25.0000 mg | ORAL_CAPSULE | Freq: Four times a day (QID) | ORAL | Status: AC | PRN
  Administered 2018-12-05: 25 mg via ORAL

## 2018-12-05 MED ORDER — SODIUM CHLORIDE (PF) 0.9 % IJ SOLN
INTRAMUSCULAR | Status: AC
  Filled 2018-12-05: qty 50

## 2018-12-05 MED ORDER — DIPHENHYDRAMINE HCL 25 MG PO CAPS
ORAL_CAPSULE | ORAL | Status: AC
  Filled 2018-12-05: qty 1

## 2018-12-05 MED ORDER — HEPARIN SOD (PORK) LOCK FLUSH 100 UNIT/ML IV SOLN
500.0000 [IU] | Freq: Once | INTRAVENOUS | Status: AC
  Administered 2018-12-05: 500 [IU] via INTRAVENOUS

## 2018-12-05 MED ORDER — SODIUM CHLORIDE 0.9% FLUSH
10.0000 mL | Freq: Once | INTRAVENOUS | Status: AC
  Administered 2018-12-05: 10 mL
  Filled 2018-12-05: qty 10

## 2018-12-05 MED ORDER — HEPARIN SOD (PORK) LOCK FLUSH 100 UNIT/ML IV SOLN
INTRAVENOUS | Status: AC
  Filled 2018-12-05: qty 5

## 2018-12-06 ENCOUNTER — Encounter: Payer: Self-pay | Admitting: Hematology and Oncology

## 2018-12-06 ENCOUNTER — Telehealth: Payer: Self-pay | Admitting: Hematology and Oncology

## 2018-12-06 ENCOUNTER — Inpatient Hospital Stay: Payer: Medicare Other

## 2018-12-06 ENCOUNTER — Inpatient Hospital Stay (HOSPITAL_BASED_OUTPATIENT_CLINIC_OR_DEPARTMENT_OTHER): Payer: Medicare Other | Admitting: Hematology and Oncology

## 2018-12-06 VITALS — HR 94

## 2018-12-06 DIAGNOSIS — C541 Malignant neoplasm of endometrium: Secondary | ICD-10-CM

## 2018-12-06 DIAGNOSIS — E119 Type 2 diabetes mellitus without complications: Secondary | ICD-10-CM

## 2018-12-06 DIAGNOSIS — I10 Essential (primary) hypertension: Secondary | ICD-10-CM

## 2018-12-06 DIAGNOSIS — C7951 Secondary malignant neoplasm of bone: Secondary | ICD-10-CM | POA: Diagnosis not present

## 2018-12-06 DIAGNOSIS — R600 Localized edema: Secondary | ICD-10-CM | POA: Insufficient documentation

## 2018-12-06 DIAGNOSIS — D61818 Other pancytopenia: Secondary | ICD-10-CM | POA: Diagnosis not present

## 2018-12-06 DIAGNOSIS — Z5111 Encounter for antineoplastic chemotherapy: Secondary | ICD-10-CM | POA: Diagnosis not present

## 2018-12-06 DIAGNOSIS — M545 Low back pain: Secondary | ICD-10-CM

## 2018-12-06 LAB — CA 125: Cancer Antigen (CA) 125: 5.5 U/mL (ref 0.0–38.1)

## 2018-12-06 MED ORDER — SODIUM CHLORIDE 0.9 % IV SOLN
364.5000 mg | Freq: Once | INTRAVENOUS | Status: AC
  Administered 2018-12-06: 360 mg via INTRAVENOUS
  Filled 2018-12-06: qty 36

## 2018-12-06 MED ORDER — PALONOSETRON HCL INJECTION 0.25 MG/5ML
INTRAVENOUS | Status: AC
  Filled 2018-12-06: qty 5

## 2018-12-06 MED ORDER — FAMOTIDINE IN NACL 20-0.9 MG/50ML-% IV SOLN
20.0000 mg | Freq: Once | INTRAVENOUS | Status: AC
  Administered 2018-12-06: 20 mg via INTRAVENOUS

## 2018-12-06 MED ORDER — PALONOSETRON HCL INJECTION 0.25 MG/5ML
0.2500 mg | Freq: Once | INTRAVENOUS | Status: AC
  Administered 2018-12-06: 0.25 mg via INTRAVENOUS

## 2018-12-06 MED ORDER — FAMOTIDINE IN NACL 20-0.9 MG/50ML-% IV SOLN
INTRAVENOUS | Status: AC
  Filled 2018-12-06: qty 50

## 2018-12-06 MED ORDER — SODIUM CHLORIDE 0.9 % IV SOLN
Freq: Once | INTRAVENOUS | Status: AC
  Administered 2018-12-06: 11:00:00 via INTRAVENOUS
  Filled 2018-12-06: qty 5

## 2018-12-06 MED ORDER — SODIUM CHLORIDE 0.9 % IV SOLN
Freq: Once | INTRAVENOUS | Status: AC
  Administered 2018-12-06: 11:00:00 via INTRAVENOUS
  Filled 2018-12-06: qty 250

## 2018-12-06 MED ORDER — SODIUM CHLORIDE 0.9% FLUSH
10.0000 mL | INTRAVENOUS | Status: DC | PRN
  Administered 2018-12-06: 10 mL
  Filled 2018-12-06: qty 10

## 2018-12-06 MED ORDER — SODIUM CHLORIDE 0.9 % IV SOLN
25.0000 mg | Freq: Once | INTRAVENOUS | Status: AC
  Administered 2018-12-06: 25 mg via INTRAVENOUS
  Filled 2018-12-06: qty 0.5

## 2018-12-06 MED ORDER — METFORMIN HCL ER 500 MG PO TB24: 500.0000 mg | ORAL_TABLET | Freq: Two times a day (BID) | ORAL | 1 refills | Status: DC

## 2018-12-06 MED ORDER — SODIUM CHLORIDE 0.9 % IV SOLN
131.2500 mg/m2 | Freq: Once | INTRAVENOUS | Status: AC
  Administered 2018-12-06: 222 mg via INTRAVENOUS
  Filled 2018-12-06: qty 37

## 2018-12-06 MED ORDER — HEPARIN SOD (PORK) LOCK FLUSH 100 UNIT/ML IV SOLN
500.0000 [IU] | Freq: Once | INTRAVENOUS | Status: AC | PRN
  Administered 2018-12-06: 500 [IU]
  Filled 2018-12-06: qty 5

## 2018-12-06 NOTE — Telephone Encounter (Signed)
Gave avs and calendar ° °

## 2018-12-06 NOTE — Assessment & Plan Note (Signed)
She has mild pancytopenia from treatment She is not symptomatic Observe only for now I do not plan dose adjustment at this point.

## 2018-12-06 NOTE — Patient Instructions (Signed)
   [facility] Discharge Instructions for Patients Receiving Chemotherapy  Today you received the following chemotherapy agents Taxol and Carboplatin   To help prevent nausea and vomiting after your treatment, we encourage you to take your nausea medication as directed.    If you develop nausea and vomiting that is not controlled by your nausea medication, call the clinic.   BELOW ARE SYMPTOMS THAT SHOULD BE REPORTED IMMEDIATELY:  *FEVER GREATER THAN 100.5 F  *CHILLS WITH OR WITHOUT FEVER  NAUSEA AND VOMITING THAT IS NOT CONTROLLED WITH YOUR NAUSEA MEDICATION  *UNUSUAL SHORTNESS OF BREATH  *UNUSUAL BRUISING OR BLEEDING  TENDERNESS IN MOUTH AND THROAT WITH OR WITHOUT PRESENCE OF ULCERS  *URINARY PROBLEMS  *BOWEL PROBLEMS  UNUSUAL RASH Items with * indicate a potential emergency and should be followed up as soon as possible.  Feel free to call the clinic should you have any questions or concerns. The clinic phone number is [phone].  Please show the CHEMO ALERT CARD at check-in to the Emergency Department and triage nurse.   

## 2018-12-06 NOTE — Assessment & Plan Note (Signed)
She has evidence of fluid retention We discussed dietary modification and furosemide

## 2018-12-06 NOTE — Assessment & Plan Note (Signed)
I have reviewed imaging study with the patient and family She has positive response to treatment I recommend total 6 cycles of chemotherapy before repeat imaging study We also discussed the role of additional treatment in the future such as bevacizumab for maintenance therapy

## 2018-12-06 NOTE — Assessment & Plan Note (Signed)
Her blood pressure is getting worse I recommend resumption of blood pressure medication and for her to check her blood pressure twice a day I plan to call her next week for medication adjustment

## 2018-12-06 NOTE — Assessment & Plan Note (Signed)
Her cystic lesion on the bone represent positive response to treatment However, it appears that the patient has developed pelvic fracture I recommend orthopedic surgery consult I recommend calcium with vitamin D supplement

## 2018-12-06 NOTE — Progress Notes (Signed)
[place] OFFICE PROGRESS NOTE  Patient Care Team: [name], MD as PCP - General (Internal Medicine) [name], MD as Consulting Physician (Gastroenterology) [name], MD as Consulting Physician (Ophthalmology) [name], [name], MD as Consulting Physician (Obstetrics and Gynecology) [name], MD as Consulting Physician (Hematology and Oncology) [name], MD as Consulting Physician (Radiation Oncology)  ASSESSMENT & PLAN:  Endometrial cancer [facility]) I have reviewed imaging study with the patient and family She has positive response to treatment I recommend total 6 cycles of chemotherapy before repeat imaging study We also discussed the role of additional treatment in the future such as bevacizumab for maintenance therapy  Metastasis to bone [facility]) Her cystic lesion on the bone represent positive response to treatment However, it appears that the patient has developed pelvic fracture I recommend orthopedic surgery consult I recommend calcium with vitamin D supplement  Hypertension Her blood pressure is getting worse I recommend resumption of blood pressure medication and for her to check her blood pressure twice a day I plan to call her next week for medication adjustment  Fluid retention in legs She has evidence of fluid retention We discussed dietary modification and furosemide  Pancytopenia, acquired ([place]) She has mild pancytopenia from treatment She is not symptomatic Observe only for now I do not plan dose adjustment at this point.   Orders Placed This Encounter  Procedures  . Ambulatory referral to Orthopedic Surgery    Referral Priority:   Routine    Referral Type:   Surgical    Referral Reason:   Specialty Services Required    Requested Specialty:   Orthopedic Surgery    Number of Visits Requested:   1    INTERVAL HISTORY: Please see below for problem oriented charting. She returns with her son and husband for further  follow-up, to review of test results and cycle 4 of chemotherapy Since her last time I saw her, she developed some more pain on her back Tramadol was not helpful but steroids is helpful Leg swelling is stable She denies worsening peripheral neuropathy No recent nausea or vomiting Denies constipation.  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 SEE COMMENT.  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 The greatest extent of invasive carcinoma as measured on the needle core biopsy measures 0.6 cm.       Endometrial cancer ([place])   02/03/2002 Pathology Results    1. BENIGN ENDOMETRIAL POLYPS.  2. UTERINE FIBROIDS: PORTIONS OF SMOOTH MUSCLE CONSISTENT WITH BENIGN LEIOMYOMA(S). PORTIONS OF BENIGN, CYSTICALLY ATROPHIC ENDOMETRIUM WITHOUT HYPERPLASIA OR EVIDENCE OF MALIGNANCY. 3. ENDOMETRIAL CURETTAGE: BENIGN ENDOMETRIUM AND SMOOTH MUSCLE.    09/07/2016 Pathology Results    PAP smear positive for malignant cells    09/07/2016 Initial Diagnosis    Patient had postmenopausal bleeding in 08-2016. She had PAP 09-07-16 by PCP Dr [name], which documented carcinoma. She had evaluation by Dr [name] with colposcopy/ ECC/endometrial biopsy documenting serous endometrioid endometrial carcinoma.    09/15/2016 Imaging    Ct imaging showed markedly thickened and heterogeneous endometrial stripe suspicious for endometrial carcinoma  in this patient with postmenopausal vaginal bleeding. Cystic lesion in the right ovary cannot be definitively characterized. Pelvic ultrasound is  recommended for further evaluation. Aortoiliac atherosclerosis. Avascular necrosis of the femoral heads bilaterally.    10/07/2016 Tumor Marker    Patient's tumor was tested for the following markers: CA125 Results of the tumor marker test revealed 15.1    10/13/2016 Pathology Results    1. Lymph node, sentinel, biopsy, right obturator - THERE IS NO EVIDENCE OF CARCINOMA IN 8 OF 8 LYMPH NODES (0/8). - SEE COMMENT. 2. Lymph node, sentinel, biopsy, left obturator - THERE IS NO EVIDENCE OF CARCINOMA IN 1 OF 1 LYMPH NODE (0/1). - SEE COMMENT. 3. Lymph node, sentinel, biopsy, left para-aortic - THERE IS NO EVIDENCE OF CARCINOMA IN 4 OF 4 LYMPH NODES (0/4). - SEE COMMENT. 4. Uterus +/- tubes/ovaries, neoplastic, cervix - INVASIVE MIXED SEROUS/ENDOMETRIOID ADENOCARCINOMA, MULTIPLE FOCI, FIGO GRADE III, THE LARGEST FOCUS SPANS 8.2 CM. - ADENOCARCINOMA EXTENDS INTO THE OUTER HALF OF THE MYOMETRIUM AND INVOLVES THE STROMA OF THE LOWER UTERINE SEGMENT AND CERVIX. - LYMPHOVASCULAR INVASION IS IDENTIFIED. - THE SURGICAL RESECTION MARGINS ARE NEGATIVE FOR CARCINOMA. - SEE ONCOLOGY TABLE BELOW. ADDITIONAL FINDINGS: - ENDOMETRIUM: ENDOMETRIOID TYPE POLYP(S), WHICH CONTAIN FOCI OF SIMILAR APPEARING MIXED SEROUS/ENDOMETRIOID ADENOCARCINOMA. - MYOMETRIUM: LEIOMYOMATA. - SEROSA: UNREMARKABLE. - BILATERAL ADNEXA: BENIGN OVARIES AND FALLOPIAN TUBES.    10/13/2016 Surgery    Surgery: Total robotic hysterectomy bilateral salpingo-oophorectomy, bilateral pelvic sentinel lymph node removal, left PA sentinel lymph node removal. Mini-laparotomy for specimen removal Surgeons:  [name], MD; [name], MD  Pathology:  1)Uterus, cervix, bilateral tubes and ovaries 2) Right obturator SLN 3) Left Obturator SLN 4) Left PA SLN Operative findings: 14 week fibroid uterus with one large dominant 6 cm myoma that was palpably calcified. 4 cm right ovarian cyst. + SLN identified in the bilateral  obturator spaces, + SLN identified in the left PA space.     12/01/2016 - 05/31/2017 Chemotherapy    She received carboplatin & Taxol. She had 3 cycles of chemo followed by radiation and then 3 more cycles of chemo    01/11/2017 Genetic Testing    Testing was normal and did not reveal a mutation in these genes: The genes tested were the 80 genes on [employer]'s Multi-Cancer panel (ALK, APC, ATM, AXIN2, BAP1, BARD1, BLM, BMPR1A, BRCA1, BRCA2, BRIP1, CASR, CDC73, CDH1, CDK4, CDKN1B, CDKN1C, CDKN2A, CEBPA, CHEK2, DICER1, DIS3L2, EGFR, EPCAM, FH, FLCN, GATA2, GPC3, GREM1, HOXB13,  HRAS, KIT, MAX, MEN1, MET, MITF, MLH1, MSH2, MSH6, MUTYH, NBN, NF1, NF2, PALB2, PDGFRA, PHOX2B, PMS2, POLD1, POLE, POT1, PRKAR1A, PTCH1, PTEN, RAD50, RAD51C, RAD51D, RB1, RECQL4, RET, RUNX1, SDHA, SDHAF2, SDHB, SDHC, SDHD, SMAD4, SMARCA4, SMARCB1, SMARCE1, STK11, SUFU, TERC, TERT, TMEM127, TP53, TSC1, TSC2, VHL, WRN, and WT1).    02/10/2017 - 04/12/2017 Radiation Therapy    02/10/17-03/16/17; 03/29/17-04/12/17;  1) Pelvis/ 45 Gy in 25 fractions  2) Vaginal Cuff/ 18 Gy in 3 fractions    08/26/2018 Imaging    Large destructive mass lesion left iliac bone with large soft tissue mass extending into the posterior soft tissues and pelvis compatible with metastatic disease. Consider follow-up CT chest abdomen pelvis with contrast for further staging.  Radiation changes at L5 and the sacrum. No lumbar metastatic deposits  Multilevel degenerative changes above.    08/29/2018 Imaging    1. 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 2. New irregular nodular opacity in the medial right middle  lobe, new since 2017 CT. Separate subcentimeter right upper lobe solid pulmonary nodule, for which no comparison exists. These findings are indeterminate for pulmonary metastases and attention on follow-up chest CT in 3 months is recommended. 3. No lymphadenopathy or additional findings of metastatic disease.No  tumor recurrence at the hysterectomy margin. 4. Aortic Atherosclerosis (ICD10-I70.0). Additional chronic findings as detailed.    08/29/2018 Tumor Marker    Patient's tumor was tested for the following markers: CA-125 Results of the tumor marker test revealed 17.4    09/06/2018 Procedure    CT-guided core biopsy performed of huge mass in the left iliac fossa destroying the left iliac bone.    09/06/2018 Pathology Results    Soft Tissue Needle Core Biopsy, Left Iliac Fossa - POORLY DIFFERENTIATED MALIGNANT NEOPLASM. Microscopic Comment The provided clinical history of both endometrial and breast cancer is noted. In the current case, immunohistochemistry for qualitative ER demonstrates scattered positive staining. CK8/18 is focally positive. CKAE1/3, CK7, CK20, TTF-1, CDX-2, GATA3 and PAX 8 are negative. The immunophenotype is not specific as to an origin for this poorly differentiated malignant neoplasm. The morphology is more suggestive of endometrial cancer than breast cancer, and may represent an unrecognized carcinosarcomatous component.    09/08/2018 - 09/27/2018 Radiation Therapy    She had palliative radiation treatment    09/29/2018 Procedure    Placement of single lumen port a cath via right internal jugular vein. The catheter tip lies at the cavo-atrial junction. A power injectable port a cath was placed and is ready for immediate use.    10/04/2018 -  Chemotherapy    The patient had carboplatin and taxol    12/05/2018 Tumor Marker    Patient's tumor was tested for the following markers: CA125 Results of the tumor marker test revealed 5.5    12/05/2018 Imaging    1. Considerable cystic degeneration in the large destructive mass of the left iliac bone. New or progressive left iliac bone pathologic fracture extending into the left SI joint, along with abnormal subluxation at the pubic symphysis indicating pubic symphysis instability. 2. Previous pleural-based nodularity in the  right middle lobe is markedly improved, currently 4 mm in thickness and previously 11 mm in thickness. 3. Indistinct new wedge-shaped lesion in the left mid kidney, hypodense on portal venous phase images and hyperdense on delayed phase images, suggesting prolong tubular transit. This is a nonspecific finding but could reflect local inflammation, infection, or less likely infiltrative tumor. 4. Other imaging findings of potential clinical significance: Aortic Atherosclerosis (ICD10-I70.0). Left main coronary artery atherosclerosis. 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     Metastasis to bone ([place])   08/30/2018 Initial Diagnosis    Metastasis to bone ([place])    10/04/2018 -  Chemotherapy    The patient had palonosetron (ALOXI) injection 0.25 mg, 0.25 mg, Intravenous,  Once, 3 of 6 cycles Administration: 0.25 mg (10/04/2018), 0.25 mg (10/25/2018), 0.25 mg (11/15/2018) CARBOplatin (PARAPLATIN) 360 mg in sodium chloride 0.9 % 250 mL chemo infusion, 360 mg (100 % of original dose 364.5 mg), Intravenous,  Once, 3 of 6 cycles Dose modification:   (original dose 364.5 mg, Cycle 1) Administration: 360 mg (10/04/2018), 360 mg (10/25/2018), 360 mg (11/15/2018) PACLitaxel (TAXOL) 222 mg in sodium chloride 0.9 % 250 mL chemo infusion (> '80mg'$ /m2), 131.25 mg/m2 = 222 mg (75 % of original dose 175 mg/m2), Intravenous,  Once, 3 of 6  cycles Dose modification: 131.25 mg/m2 (75 % of original dose 175 mg/m2, Cycle 1, Reason: Dose Not Tolerated) Administration: 222 mg (10/04/2018), 222 mg (10/25/2018), 222 mg (11/15/2018) fosaprepitant (EMEND) 150 mg, dexamethasone (DECADRON) 12 mg in sodium chloride 0.9 % 145 mL IVPB, , Intravenous,  Once, 3 of 6 cycles Administration:  (10/04/2018),  (10/25/2018),  (11/15/2018)  for chemotherapy treatment.      REVIEW OF SYSTEMS:   Constitutional: Denies fevers, chills or  abnormal weight loss Eyes: Denies blurriness of vision Ears, nose, mouth, throat, and face: Denies mucositis or sore throat Respiratory: Denies cough, dyspnea or wheezes Skin: Denies abnormal skin rashes Lymphatics: Denies new lymphadenopathy or easy bruising Neurological:Denies numbness, tingling or new weaknesses Behavioral/Psych: Mood is stable, no new changes  All other systems were reviewed with the patient and are negative.  I have reviewed the past medical history, past surgical history, social history and family history with the patient and they are unchanged from previous note.  ALLERGIES:  is allergic to omnipaque [iohexol]; calan [verapamil]; effexor [venlafaxine]; erythromycin; femara [letrozole]; fosamax [alendronate]; ibuprofen; paxil [paroxetine hcl]; remeron [mirtazapine]; tamoxifen; and vasotec [enalapril].  MEDICATIONS:  Current Outpatient Medications  Medication Sig Dispense Refill  . triamterene-hydrochlorothiazide (MAXZIDE-25) 37.5-25 MG tablet Take 1 tablet by mouth daily.    Marland Kitchen acetaminophen (TYLENOL) 500 MG tablet Take 500 mg by mouth daily as needed for moderate pain or headache.    Marland Kitchen aspirin 81 MG tablet Take 81 mg by mouth daily.      . Calcium Carbonate-Vitamin D (CALCIUM 600 + D PO) Take 1 tablet by mouth 2 (two) times daily.     . Cholecalciferol (VITAMIN D) 2000 units CAPS Take 2,000 Units by mouth every other day.    Marland Kitchen dexamethasone (DECADRON) 4 MG tablet Take 3 tabs at the night before and 3 tab the morning of chemotherapy, every 3 weeks, by mouth 36 tablet 0  . furosemide (LASIX) 20 MG tablet Once daily for 3 days, repeat as needed for lower extremity edema 30 tablet 1  . gabapentin (NEURONTIN) 300 MG capsule Take 1 capsule (300 mg total) by mouth 2 (two) times daily. (Patient not taking: Reported on 10/27/2018) 60 capsule 11  . levothyroxine (SYNTHROID, LEVOTHROID) 88 MCG tablet Take 1 tablet (88 mcg total) by mouth daily before breakfast. (Patient not  taking: Reported on 10/27/2018) 90 tablet 1  . lidocaine-prilocaine (EMLA) cream Apply to affected area once (Patient not taking: Reported on 10/18/2018) 30 g 3  . loratadine (CLARITIN) 10 MG tablet Take 10 mg by mouth daily as needed for allergies.    Marland Kitchen loratadine-pseudoephedrine (CLARITIN-D 24 HOUR) 10-240 MG 24 hr tablet Take 1 tablet by mouth daily as needed for allergies.    . metFORMIN (GLUCOPHAGE-XR) 500 MG 24 hr tablet Take 1 tablet (500 mg total) by mouth 2 (two) times daily. 360 tablet 1  . Multiple Vitamin (MULTIVITAMIN WITH MINERALS) TABS tablet Take 1 tablet by mouth daily.    . ondansetron (ZOFRAN) 8 MG tablet Take 1 tablet (8 mg total) by mouth every 8 (eight) hours as needed for refractory nausea / vomiting. Start on day 3 after chemo. 30 tablet 1  . pravastatin (PRAVACHOL) 40 MG tablet Take 1 tablet (40 mg total) by mouth every evening. 90 tablet 1  . prochlorperazine (COMPAZINE) 10 MG tablet Take 1 tablet (10 mg total) by mouth every 6 (six) hours as needed (Nausea or vomiting). 30 tablet 1  . traMADol (ULTRAM) 50 MG tablet Take 1 tablet (  50 mg total) by mouth 3 (three) times daily. Take 1/2 to 1 tablet every 4 hours as needed for severe Back Pain 90 tablet 0   No current facility-administered medications for this visit.     PHYSICAL EXAMINATION: ECOG PERFORMANCE STATUS: 2 - Symptomatic, <50% confined to bed  Vitals:   12/06/18 0938  BP: (!) 156/76  Pulse: (!) 110  Resp: 18  Temp: 98.1 F (36.7 C)  SpO2: 96%   Filed Weights   12/06/18 0938  Weight: 152 lb 3.2 oz (69 kg)    GENERAL:alert, no distress and comfortable SKIN: skin color, texture, turgor are normal, no rashes or significant lesions EYES: normal, Conjunctiva are pink and non-injected, sclera clear OROPHARYNX:no exudate, no erythema and lips, buccal mucosa, and tongue normal  NECK: supple, thyroid normal size, non-tender, without nodularity LYMPH:  no palpable lymphadenopathy in the cervical, axillary or  inguinal LUNGS: clear to auscultation and percussion with normal breathing effort HEART: regular rate & rhythm and no murmurs with mild right lower extremity edema ABDOMEN:abdomen soft, non-tender and normal bowel sounds Musculoskeletal:no cyanosis of digits and no clubbing  NEURO: alert & oriented x 3 with fluent speech, no focal motor/sensory deficits  LABORATORY DATA:  I have reviewed the data as listed    Component Value Date/Time   NA 140 12/05/2018 1050   NA 137 05/31/2017 0931   K 3.6 12/05/2018 1050   K 3.5 05/31/2017 0931   CL 101 12/05/2018 1050   CL 99 12/20/2012 1336   CO2 30 12/05/2018 1050   CO2 25 05/31/2017 0931   GLUCOSE 103 (H) 12/05/2018 1050   GLUCOSE 161 (H) 05/31/2017 0931   GLUCOSE 110 (H) 12/20/2012 1336   BUN 10 12/05/2018 1050   BUN 10.9 05/31/2017 0931   CREATININE 0.67 12/05/2018 1050   CREATININE 0.63 07/05/2018 1009   CREATININE 0.8 05/31/2017 0931   CALCIUM 9.5 12/05/2018 1050   CALCIUM 10.6 (H) 05/31/2017 0931   PROT 6.7 12/05/2018 1050   PROT 7.5 05/31/2017 0931   ALBUMIN 3.4 (L) 12/05/2018 1050   ALBUMIN 4.2 05/31/2017 0931   AST 12 (L) 12/05/2018 1050   AST 16 05/31/2017 0931   ALT 7 12/05/2018 1050   ALT 15 05/31/2017 0931   ALKPHOS 81 12/05/2018 1050   ALKPHOS 91 05/31/2017 0931   BILITOT 0.3 12/05/2018 1050   BILITOT 0.33 05/31/2017 0931   GFRNONAA >60 12/05/2018 1050   GFRNONAA 87 07/05/2018 1009   GFRAA >60 12/05/2018 1050   GFRAA 100 07/05/2018 1009    No results found for: SPEP, UPEP  Lab Results  Component Value Date   WBC 3.8 (L) 12/05/2018   NEUTROABS 2.7 12/05/2018   HGB 10.4 (L) 12/05/2018   HCT 33.4 (L) 12/05/2018   MCV 79.7 (L) 12/05/2018   PLT 349 12/05/2018      Chemistry      Component Value Date/Time   NA 140 12/05/2018 1050   NA 137 05/31/2017 0931   K 3.6 12/05/2018 1050   K 3.5 05/31/2017 0931   CL 101 12/05/2018 1050   CL 99 12/20/2012 1336   CO2 30 12/05/2018 1050   CO2 25 05/31/2017 0931    BUN 10 12/05/2018 1050   BUN 10.9 05/31/2017 0931   CREATININE 0.67 12/05/2018 1050   CREATININE 0.63 07/05/2018 1009   CREATININE 0.8 05/31/2017 0931      Component Value Date/Time   CALCIUM 9.5 12/05/2018 1050   CALCIUM 10.6 (H) 05/31/2017 0931   ALKPHOS  81 12/05/2018 1050   ALKPHOS 91 05/31/2017 0931   AST 12 (L) 12/05/2018 1050   AST 16 05/31/2017 0931   ALT 7 12/05/2018 1050   ALT 15 05/31/2017 0931   BILITOT 0.3 12/05/2018 1050   BILITOT 0.33 05/31/2017 0931       RADIOGRAPHIC STUDIES: I have reviewed multiple test results with the patient and family including imaging study I have personally reviewed the radiological images as listed and agreed with the findings in the report. Ct Chest W Contrast  Result Date: 12/05/2018 CLINICAL DATA:  Metastatic endometrial cancer. History of breast cancer. Ongoing chemotherapy. Prior radiation therapy to the left hip region. Left hip and anterior left leg pain for 1 week. EXAM: CT CHEST, ABDOMEN, AND PELVIS WITH CONTRAST TECHNIQUE: Multidetector CT imaging of the chest, abdomen and pelvis was performed following the standard protocol during bolus administration of intravenous contrast. CONTRAST:  182m OMNIPAQUE IOHEXOL 300 MG/ML  SOLN By report the patient developed hives on the face, lips, hands, and like after IV contrast administration. KRowe Robert PA help to manage this minor reaction. By report the patient was given 25 mg of oral Benadryl and health for 20 minutes prior to release. COMPARISON:  08/29/2018 FINDINGS: CT CHEST FINDINGS Cardiovascular: Right Port-A-Cath tip: Superior vena cava. Atherosclerotic calcification of the aortic arch and left main coronary artery. Mediastinum/Nodes: 2.7 by 1.4 cm hypodense nodule in the left thyroid lobe; this was worked up back in 2008 by biopsy. Lungs/Pleura: Mild scarring or atelectasis in the posterior basal segment left lower lobe. The previous pleural-based nodularity in the right middle lobe  medially has nearly completely resolved and has more of a scar-like appearance, measuring about 4 mm in thickness on image 101/6, formerly up to about 11 mm in thickness. Musculoskeletal: Mild thoracic spondylosis. Rounded density compatible with prior lumpectomy in the left lateral breast. CT ABDOMEN PELVIS FINDINGS Hepatobiliary: Unremarkable Pancreas: Unremarkable Spleen: Unremarkable Adrenals/Urinary Tract: 2.0 by 1.6 cm fluid density lesion of the left kidney upper pole. Indistinct new 1.4 by 1.4 by 1.6 cm lesion in the left mid kidney, hypodense on portal venous phase images but hyperdense on delayed phase images, raising the possibility of prolonged tubular transit. Stomach/Bowel: Unremarkable Vascular/Lymphatic: Aortoiliac atherosclerotic vascular disease. Reproductive: Uterus absent.  Adnexa unremarkable. Other: No supplemental non-categorized findings. Musculoskeletal: Considerable increase in cystic degeneration of the large destructive mass of the left iliac bone. New or progressive left iliac bone pathologic fracture extending into the left sacroiliac joint. Soft tissue components of the tumor extended into the iliacus and gluteus medius muscles. The overall size of the lesion is roughly similar to prior. There is abnormal subluxation at the pubic symphysis compared to previous exams, with the left pubic bone about 6 mm posterior to the right, and with the left pubic bone about 6 mm superior to the right. The appearance suggests instability. 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 Degenerative spurring of both hip joints. IMPRESSION: 1. Considerable cystic degeneration in the large destructive mass of the left iliac bone. New or progressive left iliac bone pathologic fracture extending into the left SI joint, along with abnormal subluxation at the pubic symphysis indicating pubic symphysis instability. 2. Previous pleural-based nodularity in the right middle lobe is markedly  improved, currently 4 mm in thickness and previously 11 mm in thickness. 3. Indistinct new wedge-shaped lesion in the left mid kidney, hypodense on portal venous phase images and hyperdense on delayed phase images, suggesting prolong tubular transit. This  is a nonspecific finding but could reflect local inflammation, infection, or less likely infiltrative tumor. 4. Other imaging findings of potential clinical significance: Aortic Atherosclerosis (ICD10-I70.0). Left main coronary artery atherosclerosis. 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 Electronically Signed   By: Van Clines M.D.   On: 12/05/2018 15:35   Ct Abdomen Pelvis W Contrast  Result Date: 12/05/2018 CLINICAL DATA:  Metastatic endometrial cancer. History of breast cancer. Ongoing chemotherapy. Prior radiation therapy to the left hip region. Left hip and anterior left leg pain for 1 week. EXAM: CT CHEST, ABDOMEN, AND PELVIS WITH CONTRAST TECHNIQUE: Multidetector CT imaging of the chest, abdomen and pelvis was performed following the standard protocol during bolus administration of intravenous contrast. CONTRAST:  157m OMNIPAQUE IOHEXOL 300 MG/ML  SOLN By report the patient developed hives on the face, lips, hands, and like after IV contrast administration. KRowe Robert PA help to manage this minor reaction. By report the patient was given 25 mg of oral Benadryl and health for 20 minutes prior to release. COMPARISON:  08/29/2018 FINDINGS: CT CHEST FINDINGS Cardiovascular: Right Port-A-Cath tip: Superior vena cava. Atherosclerotic calcification of the aortic arch and left main coronary artery. Mediastinum/Nodes: 2.7 by 1.4 cm hypodense nodule in the left thyroid lobe; this was worked up back in 2008 by biopsy. Lungs/Pleura: Mild scarring or atelectasis in the posterior basal segment left lower lobe. The previous pleural-based nodularity  in the right middle lobe medially has nearly completely resolved and has more of a scar-like appearance, measuring about 4 mm in thickness on image 101/6, formerly up to about 11 mm in thickness. Musculoskeletal: Mild thoracic spondylosis. Rounded density compatible with prior lumpectomy in the left lateral breast. CT ABDOMEN PELVIS FINDINGS Hepatobiliary: Unremarkable Pancreas: Unremarkable Spleen: Unremarkable Adrenals/Urinary Tract: 2.0 by 1.6 cm fluid density lesion of the left kidney upper pole. Indistinct new 1.4 by 1.4 by 1.6 cm lesion in the left mid kidney, hypodense on portal venous phase images but hyperdense on delayed phase images, raising the possibility of prolonged tubular transit. Stomach/Bowel: Unremarkable Vascular/Lymphatic: Aortoiliac atherosclerotic vascular disease. Reproductive: Uterus absent.  Adnexa unremarkable. Other: No supplemental non-categorized findings. Musculoskeletal: Considerable increase in cystic degeneration of the large destructive mass of the left iliac bone. New or progressive left iliac bone pathologic fracture extending into the left sacroiliac joint. Soft tissue components of the tumor extended into the iliacus and gluteus medius muscles. The overall size of the lesion is roughly similar to prior. There is abnormal subluxation at the pubic symphysis compared to previous exams, with the left pubic bone about 6 mm posterior to the right, and with the left pubic bone about 6 mm superior to the right. The appearance suggests instability. 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 Degenerative spurring of both hip joints. IMPRESSION: 1. Considerable cystic degeneration in the large destructive mass of the left iliac bone. New or progressive left iliac bone pathologic fracture extending into the left SI joint, along with abnormal subluxation at the pubic symphysis indicating pubic symphysis instability. 2. Previous pleural-based nodularity in the right  middle lobe is markedly improved, currently 4 mm in thickness and previously 11 mm in thickness. 3. Indistinct new wedge-shaped lesion in the left mid kidney, hypodense on portal venous phase images and hyperdense on delayed phase images, suggesting prolong tubular transit. This is a nonspecific finding but could reflect local inflammation, infection, or less likely infiltrative  tumor. 4. Other imaging findings of potential clinical significance: Aortic Atherosclerosis (ICD10-I70.0). Left main coronary artery atherosclerosis. 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 Electronically Signed   By: Van Clines M.D.   On: 12/05/2018 15:35    All questions were answered. The patient knows to call the clinic with any problems, questions or concerns. No barriers to learning was detected.  I spent 30 minutes counseling the patient face to face. The total time spent in the appointment was 40 minutes and more than 50% was on counseling and review of test results  [name], MD 12/06/2018 10:31 AM

## 2018-12-12 ENCOUNTER — Ambulatory Visit (INDEPENDENT_AMBULATORY_CARE_PROVIDER_SITE_OTHER): Payer: Medicare Other

## 2018-12-12 ENCOUNTER — Ambulatory Visit (INDEPENDENT_AMBULATORY_CARE_PROVIDER_SITE_OTHER): Payer: Medicare Other | Admitting: Orthopaedic Surgery

## 2018-12-12 ENCOUNTER — Telehealth: Payer: Self-pay

## 2018-12-12 ENCOUNTER — Encounter (INDEPENDENT_AMBULATORY_CARE_PROVIDER_SITE_OTHER): Payer: Self-pay | Admitting: Orthopaedic Surgery

## 2018-12-12 DIAGNOSIS — R634 Abnormal weight loss: Secondary | ICD-10-CM

## 2018-12-12 DIAGNOSIS — M25552 Pain in left hip: Secondary | ICD-10-CM

## 2018-12-12 NOTE — Telephone Encounter (Signed)
Called and given below message.  She will continue checking bp and recording.  She is taking triamterene-hydrochlorothiazide (MAXZIDE-25) 37.5-25 MG tablet in am daily.

## 2018-12-12 NOTE — Telephone Encounter (Signed)
-----   Message from [name], MD sent at 12/12/2018  8:29 AM EST ----- Regarding: BP control Can you call and ask how is her BP control?

## 2018-12-12 NOTE — Telephone Encounter (Signed)
Much better Please document in her chart what dose of her BP meds and when she takes it

## 2018-12-12 NOTE — Progress Notes (Signed)
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  She has a known destructive lesion in the left ilium involving the sacroiliac joint as well.  She had gotten a point where she could not ambulate due to severe pain in the left side of her pelvis.  A combination of radiation therapy and chemotherapy has helped and she is getting better overall.  She does have radicular component of her pain going down to her knee.  On exam today I can put her left hip to internal extra rotation and there is no pain in the groin at all.  The left thigh and knee feel ligamentously stable with no effusion and no problems on exam today.  I did review plain films of her pelvis and left hip as well as the CT scan and I reviewed this extensively with her.  There is a large destructive lesion in the ileum that is caused a fracture in this area.  Certainly the nature of this type of lesion can cause the radicular symptoms and the extent of it can certainly be causing some L5 and S1 issues.  This will also cause the pelvic pain she is experiencing.  There is not a surgery that we can recommend to debulk or stabilize this type of pelvic lesion.  If the current thought is surgery is an option, she would need to be referred to a orthopedic oncologist such as [facility] and Dr. Janice Coffin.

## 2018-12-12 NOTE — Telephone Encounter (Signed)
Called with below message. See bp results. 12/01/18 am bp- 146/78 and pm-134/86 12/03/18 am-136/91 and pm-134/86 12/04/18 am-128/80 only 1/15- am- 123/70 and pm-118/76 1/17 am- 127/79 and pm- 128/80 1/18 am- 122/76 and pm-124/87 1/19 am-116/71 and pm- 97/84 Today am- 123/94.

## 2018-12-21 ENCOUNTER — Telehealth: Payer: Self-pay | Admitting: Oncology

## 2018-12-21 NOTE — Telephone Encounter (Signed)
Pls proceed with dentist eval The fatigue is likely from recent chemo

## 2018-12-21 NOTE — Telephone Encounter (Signed)
Tammy Hensley called and said she had a cavity fall out last night and is wondering if she can go to the dentist. It is not painful. She also said she is "extra tired" which started a couple of days ago.  Her sinuses are bothering her so she is not sure if the fatigue is from that or if her blood counts are low.

## 2018-12-21 NOTE — Telephone Encounter (Signed)
Notified Deloras of message from Dr. Alvy Bimler.  She verbalized understanding.

## 2018-12-27 ENCOUNTER — Encounter: Payer: Self-pay | Admitting: Hematology and Oncology

## 2018-12-27 ENCOUNTER — Inpatient Hospital Stay: Payer: Medicare Other | Attending: Hematology and Oncology

## 2018-12-27 ENCOUNTER — Inpatient Hospital Stay (HOSPITAL_BASED_OUTPATIENT_CLINIC_OR_DEPARTMENT_OTHER): Payer: Medicare Other | Admitting: Hematology and Oncology

## 2018-12-27 ENCOUNTER — Inpatient Hospital Stay: Payer: Medicare Other

## 2018-12-27 DIAGNOSIS — C541 Malignant neoplasm of endometrium: Secondary | ICD-10-CM

## 2018-12-27 DIAGNOSIS — G893 Neoplasm related pain (acute) (chronic): Secondary | ICD-10-CM | POA: Diagnosis not present

## 2018-12-27 DIAGNOSIS — R600 Localized edema: Secondary | ICD-10-CM

## 2018-12-27 DIAGNOSIS — C7951 Secondary malignant neoplasm of bone: Secondary | ICD-10-CM

## 2018-12-27 DIAGNOSIS — Z5111 Encounter for antineoplastic chemotherapy: Secondary | ICD-10-CM | POA: Insufficient documentation

## 2018-12-27 DIAGNOSIS — I10 Essential (primary) hypertension: Secondary | ICD-10-CM

## 2018-12-27 DIAGNOSIS — C7801 Secondary malignant neoplasm of right lung: Secondary | ICD-10-CM

## 2018-12-27 LAB — CMP (CANCER CENTER ONLY)
ALT: 10 U/L (ref 0–44)
AST: 13 U/L — ABNORMAL LOW (ref 15–41)
Albumin: 3.6 g/dL (ref 3.5–5.0)
Alkaline Phosphatase: 89 U/L (ref 38–126)
Anion gap: 11 (ref 5–15)
BUN: 13 mg/dL (ref 8–23)
CO2: 30 mmol/L (ref 22–32)
Calcium: 9.8 mg/dL (ref 8.9–10.3)
Chloride: 97 mmol/L — ABNORMAL LOW (ref 98–111)
Creatinine: 0.71 mg/dL (ref 0.44–1.00)
GFR, Est AFR Am: >60 mL/min (ref >60)
GFR, Estimated: >60 mL/min (ref >60)
Glucose, Bld: 150 mg/dL — ABNORMAL HIGH (ref 70–99)
POTASSIUM: 3.4 mmol/L — AB (ref 3.5–5.1)
Sodium: 138 mmol/L (ref 135–145)
Total Bilirubin: 0.4 mg/dL (ref 0.3–1.2)
Total Protein: 7.4 g/dL (ref 6.5–8.1)

## 2018-12-27 LAB — CBC WITH DIFFERENTIAL (CANCER CENTER ONLY)
Abs Immature Granulocytes: 0.04 10*3/uL (ref 0.00–0.07)
Basophils Absolute: 0 10*3/uL (ref 0.0–0.1)
Basophils Relative: 0 %
EOS PCT: 0 %
Eosinophils Absolute: 0 10*3/uL (ref 0.0–0.5)
HCT: 34.3 % — ABNORMAL LOW (ref 36.0–46.0)
HEMOGLOBIN: 11 g/dL — AB (ref 12.0–15.0)
Immature Granulocytes: 1 %
LYMPHS PCT: 6 %
Lymphs Abs: 0.4 10*3/uL — ABNORMAL LOW (ref 0.7–4.0)
MCH: 24.8 pg — ABNORMAL LOW (ref 26.0–34.0)
MCHC: 32.1 g/dL (ref 30.0–36.0)
MCV: 77.3 fL — ABNORMAL LOW (ref 80.0–100.0)
Monocytes Absolute: 0.2 10*3/uL (ref 0.1–1.0)
Monocytes Relative: 3 %
NEUTROS ABS: 6 10*3/uL (ref 1.7–7.7)
Neutrophils Relative %: 90 %
Platelet Count: 369 10*3/uL (ref 150–400)
RBC: 4.44 MIL/uL (ref 3.87–5.11)
RDW: 16.8 % — ABNORMAL HIGH (ref 11.5–15.5)
WBC Count: 6.6 10*3/uL (ref 4.0–10.5)
nRBC: 0 % (ref 0.0–0.2)

## 2018-12-27 MED ORDER — SODIUM CHLORIDE 0.9 % IV SOLN
364.5000 mg | Freq: Once | INTRAVENOUS | Status: AC
  Administered 2018-12-27: 360 mg via INTRAVENOUS
  Filled 2018-12-27: qty 36

## 2018-12-27 MED ORDER — SODIUM CHLORIDE 0.9% FLUSH
10.0000 mL | Freq: Once | INTRAVENOUS | Status: AC
  Administered 2018-12-27: 10 mL
  Filled 2018-12-27: qty 10

## 2018-12-27 MED ORDER — FAMOTIDINE IN NACL 20-0.9 MG/50ML-% IV SOLN
INTRAVENOUS | Status: AC
  Filled 2018-12-27: qty 50

## 2018-12-27 MED ORDER — DIPHENHYDRAMINE HCL 50 MG/ML IJ SOLN
INTRAMUSCULAR | Status: AC
  Filled 2018-12-27: qty 1

## 2018-12-27 MED ORDER — SODIUM CHLORIDE 0.9 % IV SOLN
Freq: Once | INTRAVENOUS | Status: AC
  Administered 2018-12-27: 13:00:00 via INTRAVENOUS
  Filled 2018-12-27: qty 5

## 2018-12-27 MED ORDER — SODIUM CHLORIDE 0.9 % IV SOLN
Freq: Once | INTRAVENOUS | Status: AC
  Administered 2018-12-27: 13:00:00 via INTRAVENOUS
  Filled 2018-12-27: qty 250

## 2018-12-27 MED ORDER — HEPARIN SOD (PORK) LOCK FLUSH 100 UNIT/ML IV SOLN
500.0000 [IU] | Freq: Once | INTRAVENOUS | Status: AC | PRN
  Administered 2018-12-27: 500 [IU]
  Filled 2018-12-27: qty 5

## 2018-12-27 MED ORDER — SODIUM CHLORIDE 0.9 % IV SOLN
131.2500 mg/m2 | Freq: Once | INTRAVENOUS | Status: AC
  Administered 2018-12-27: 222 mg via INTRAVENOUS
  Filled 2018-12-27: qty 37

## 2018-12-27 MED ORDER — SODIUM CHLORIDE 0.9% FLUSH
10.0000 mL | INTRAVENOUS | Status: AC | PRN
  Administered 2018-12-27: 10 mL
  Filled 2018-12-27: qty 10

## 2018-12-27 MED ORDER — PALONOSETRON HCL INJECTION 0.25 MG/5ML
0.2500 mg | Freq: Once | INTRAVENOUS | Status: AC
  Administered 2018-12-27: 0.25 mg via INTRAVENOUS

## 2018-12-27 MED ORDER — FAMOTIDINE IN NACL 20-0.9 MG/50ML-% IV SOLN
20.0000 mg | Freq: Once | INTRAVENOUS | Status: AC
  Administered 2018-12-27: 20 mg via INTRAVENOUS

## 2018-12-27 MED ORDER — HEPARIN SOD (PORK) LOCK FLUSH 100 UNIT/ML IV SOLN
500.0000 [IU] | Freq: Once | INTRAVENOUS | Status: DC
  Filled 2018-12-27: qty 5

## 2018-12-27 MED ORDER — DEXAMETHASONE 4 MG PO TABS: ORAL_TABLET | ORAL | 0 refills | Status: DC

## 2018-12-27 MED ORDER — SODIUM CHLORIDE 0.9 % IV SOLN
25.0000 mg | Freq: Once | INTRAVENOUS | Status: AC
  Administered 2018-12-27: 25 mg via INTRAVENOUS
  Filled 2018-12-27: qty 0.5

## 2018-12-27 MED ORDER — PALONOSETRON HCL INJECTION 0.25 MG/5ML
INTRAVENOUS | Status: AC
  Filled 2018-12-27: qty 5

## 2018-12-27 NOTE — Progress Notes (Signed)
[place] OFFICE PROGRESS NOTE  Patient Care Team: [name], MD as PCP - General (Internal Medicine) [name], MD as Consulting Physician (Gastroenterology) [name], MD as Consulting Physician (Ophthalmology) [name], [name], MD as Consulting Physician (Obstetrics and Gynecology) [name], MD as Consulting Physician (Hematology and Oncology) [name], MD as Consulting Physician (Radiation Oncology)  ASSESSMENT & PLAN:  Endometrial cancer ([place]) Overall, she tolerated treatment very well She has lost 6 pounds of fluid weight and the leg swelling has resolved We will proceed with treatment with similar dose adjustment as before I plan total 6 cycles of chemo before repeat imaging study After that, we will consider maintenance treatment with bevacizumab or anti-estrogen treatment  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 has minimum pain, well controlled with over-the-counter Tylenol only She will continue the same  Fluid retention in legs This has resolved.  I recommend discontinuation of furosemide  Hypertension Her blood pressure control is suboptimal I recommend increasing her blood pressure medicine to full strength 1 tablet a day   No orders of the defined types were placed in this encounter.   INTERVAL HISTORY: Please see below for problem oriented charting. She returns for further follow-up and chemotherapy with her husband She felt well She has lost 6 pounds of weight and her mobility has improved She denies significant bone pain.  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 SEE  COMMENT.  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 The greatest extent of invasive carcinoma as measured on the needle core biopsy measures 0.6 cm.       Endometrial cancer ([place])   02/03/2002 Pathology Results    1. BENIGN ENDOMETRIAL POLYPS.  2. UTERINE FIBROIDS: PORTIONS OF SMOOTH MUSCLE CONSISTENT WITH BENIGN LEIOMYOMA(S). PORTIONS OF BENIGN, CYSTICALLY ATROPHIC ENDOMETRIUM WITHOUT HYPERPLASIA OR EVIDENCE OF MALIGNANCY. 3. ENDOMETRIAL CURETTAGE: BENIGN ENDOMETRIUM AND SMOOTH MUSCLE.    09/07/2016 Pathology Results    PAP smear positive for malignant cells    09/07/2016 Initial Diagnosis    Patient had postmenopausal bleeding in 08-2016. She had PAP 09-07-16 by PCP Dr [name], which documented carcinoma. She had evaluation by Dr [name] with colposcopy/ ECC/endometrial biopsy documenting serous endometrioid endometrial carcinoma.    09/15/2016 Imaging    Ct imaging showed markedly thickened and heterogeneous endometrial stripe suspicious for endometrial carcinoma in this patient with postmenopausal vaginal bleeding. Cystic lesion in the right ovary cannot be definitively characterized. Pelvic ultrasound is recommended for further evaluation. Aortoiliac atherosclerosis. Avascular necrosis of the femoral heads bilaterally.    10/07/2016 Tumor Marker    Patient's tumor was tested for the following markers: CA125 Results of the tumor marker test revealed 15.1    10/13/2016 Pathology Results    1. Lymph node, sentinel, biopsy, right obturator - THERE IS NO EVIDENCE OF CARCINOMA IN 8 OF 8 LYMPH NODES (0/8). - SEE COMMENT. 2. Lymph node, sentinel, biopsy, left obturator - THERE IS NO EVIDENCE OF CARCINOMA IN 1 OF 1 LYMPH NODE (0/1). - SEE COMMENT. 3. Lymph node, sentinel, biopsy, left para-aortic - THERE  IS NO EVIDENCE OF CARCINOMA IN 4 OF 4 LYMPH  NODES (0/4). - SEE COMMENT. 4. Uterus +/- tubes/ovaries, neoplastic, cervix - INVASIVE MIXED SEROUS/ENDOMETRIOID ADENOCARCINOMA, MULTIPLE FOCI, FIGO GRADE III, THE LARGEST FOCUS SPANS 8.2 CM. - ADENOCARCINOMA EXTENDS INTO THE OUTER HALF OF THE MYOMETRIUM AND INVOLVES THE STROMA OF THE LOWER UTERINE SEGMENT AND CERVIX. - LYMPHOVASCULAR INVASION IS IDENTIFIED. - THE SURGICAL RESECTION MARGINS ARE NEGATIVE FOR CARCINOMA. - SEE ONCOLOGY TABLE BELOW. ADDITIONAL FINDINGS: - ENDOMETRIUM: ENDOMETRIOID TYPE POLYP(S), WHICH CONTAIN FOCI OF SIMILAR APPEARING MIXED SEROUS/ENDOMETRIOID ADENOCARCINOMA. - MYOMETRIUM: LEIOMYOMATA. - SEROSA: UNREMARKABLE. - BILATERAL ADNEXA: BENIGN OVARIES AND FALLOPIAN TUBES.    10/13/2016 Surgery    Surgery: Total robotic hysterectomy bilateral salpingo-oophorectomy, bilateral pelvic sentinel lymph node removal, left PA sentinel lymph node removal. Mini-laparotomy for specimen removal Surgeons:  [name], MD; [name], MD  Pathology:  1)Uterus, cervix, bilateral tubes and ovaries 2) Right obturator SLN 3) Left Obturator SLN 4) Left PA SLN Operative findings: 14 week fibroid uterus with one large dominant 6 cm myoma that was palpably calcified. 4 cm right ovarian cyst. + SLN identified in the bilateral obturator spaces, + SLN identified in the left PA space.     12/01/2016 - 05/31/2017 Chemotherapy    She received carboplatin & Taxol. She had 3 cycles of chemo followed by radiation and then 3 more cycles of chemo    01/11/2017 Genetic Testing    Testing was normal and did not reveal a mutation in these genes: The genes tested were the 80 genes on [employer]'s Multi-Cancer panel (ALK, APC, ATM, AXIN2, BAP1, BARD1, BLM, BMPR1A, BRCA1, BRCA2, BRIP1, CASR, CDC73, CDH1, CDK4, CDKN1B, CDKN1C, CDKN2A, CEBPA, CHEK2, DICER1, DIS3L2, EGFR, EPCAM, FH, FLCN, GATA2, GPC3, GREM1, HOXB13,  HRAS, KIT, MAX, MEN1, MET, MITF, MLH1, MSH2, MSH6, MUTYH, NBN, NF1, NF2, PALB2,  PDGFRA, PHOX2B, PMS2, POLD1, POLE, POT1, PRKAR1A, PTCH1, PTEN, RAD50, RAD51C, RAD51D, RB1, RECQL4, RET, RUNX1, SDHA, SDHAF2, SDHB, SDHC, SDHD, SMAD4, SMARCA4, SMARCB1, SMARCE1, STK11, SUFU, TERC, TERT, TMEM127, TP53, TSC1, TSC2, VHL, WRN, and WT1).    02/10/2017 - 04/12/2017 Radiation Therapy    02/10/17-03/16/17; 03/29/17-04/12/17;  1) Pelvis/ 45 Gy in 25 fractions  2) Vaginal Cuff/ 18 Gy in 3 fractions    08/26/2018 Imaging    Large destructive mass lesion left iliac bone with large soft tissue mass extending into the posterior soft tissues and pelvis compatible with metastatic disease. Consider follow-up CT chest abdomen pelvis with contrast for further staging.  Radiation changes at L5 and the sacrum. No lumbar metastatic deposits  Multilevel degenerative changes above.    08/29/2018 Imaging    1. 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 2. New irregular nodular opacity in the medial right middle lobe, new since 2017 CT. Separate subcentimeter right upper lobe solid pulmonary nodule, for which no comparison exists. These findings are indeterminate for pulmonary metastases and attention on follow-up chest CT in 3 months is recommended. 3. No lymphadenopathy or additional findings of metastatic disease.No tumor recurrence at the hysterectomy margin. 4. Aortic Atherosclerosis (ICD10-I70.0). Additional chronic findings as detailed.    08/29/2018 Tumor Marker    Patient's tumor was tested for the following markers: CA-125 Results of the tumor marker test revealed 17.4    09/06/2018 Procedure    CT-guided core biopsy performed of huge mass in the left iliac fossa destroying the left iliac bone.    09/06/2018 Pathology Results    Soft Tissue Needle Core Biopsy,  Left Iliac Fossa - POORLY DIFFERENTIATED MALIGNANT NEOPLASM. Microscopic Comment The provided clinical history of both endometrial and breast cancer is noted. In the current case,  immunohistochemistry for qualitative ER demonstrates scattered positive staining. CK8/18 is focally positive. CKAE1/3, CK7, CK20, TTF-1, CDX-2, GATA3 and PAX 8 are negative. The immunophenotype is not specific as to an origin for this poorly differentiated malignant neoplasm. The morphology is more suggestive of endometrial cancer than breast cancer, and may represent an unrecognized carcinosarcomatous component.    09/08/2018 - 09/27/2018 Radiation Therapy    She had palliative radiation treatment    09/29/2018 Procedure    Placement of single lumen port a cath via right internal jugular vein. The catheter tip lies at the cavo-atrial junction. A power injectable port a cath was placed and is ready for immediate use.    10/04/2018 -  Chemotherapy    The patient had carboplatin and taxol    12/05/2018 Tumor Marker    Patient's tumor was tested for the following markers: CA125 Results of the tumor marker test revealed 5.5    12/05/2018 Imaging    1. Considerable cystic degeneration in the large destructive mass of the left iliac bone. New or progressive left iliac bone pathologic fracture extending into the left SI joint, along with abnormal subluxation at the pubic symphysis indicating pubic symphysis instability. 2. Previous pleural-based nodularity in the right middle lobe is markedly improved, currently 4 mm in thickness and previously 11 mm in thickness. 3. Indistinct new wedge-shaped lesion in the left mid kidney, hypodense on portal venous phase images and hyperdense on delayed phase images, suggesting prolong tubular transit. This is a nonspecific finding but could reflect local inflammation, infection, or less likely infiltrative tumor. 4. Other imaging findings of potential clinical significance: Aortic Atherosclerosis (ICD10-I70.0). Left main coronary artery atherosclerosis. 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     Metastasis to bone ([place])   08/30/2018 Initial Diagnosis    Metastasis to bone ([place])    10/04/2018 -  Chemotherapy    The patient had palonosetron (ALOXI) injection 0.25 mg, 0.25 mg, Intravenous,  Once, 4 of 6 cycles Administration: 0.25 mg (10/04/2018), 0.25 mg (10/25/2018), 0.25 mg (11/15/2018), 0.25 mg (12/06/2018) CARBOplatin (PARAPLATIN) 360 mg in sodium chloride 0.9 % 250 mL chemo infusion, 360 mg (100 % of original dose 364.5 mg), Intravenous,  Once, 4 of 6 cycles Dose modification:   (original dose 364.5 mg, Cycle 1) Administration: 360 mg (10/04/2018), 360 mg (10/25/2018), 360 mg (11/15/2018), 360 mg (12/06/2018) PACLitaxel (TAXOL) 222 mg in sodium chloride 0.9 % 250 mL chemo infusion (> '80mg'$ /m2), 131.25 mg/m2 = 222 mg (75 % of original dose 175 mg/m2), Intravenous,  Once, 4 of 6 cycles Dose modification: 131.25 mg/m2 (75 % of original dose 175 mg/m2, Cycle 1, Reason: Dose Not Tolerated) Administration: 222 mg (10/04/2018), 222 mg (10/25/2018), 222 mg (11/15/2018), 222 mg (12/06/2018) fosaprepitant (EMEND) 150 mg, dexamethasone (DECADRON) 12 mg in sodium chloride 0.9 % 145 mL IVPB, , Intravenous,  Once, 4 of 6 cycles Administration:  (10/04/2018),  (10/25/2018),  (11/15/2018),  (12/06/2018)  for chemotherapy treatment.      REVIEW OF SYSTEMS:   Constitutional: Denies fevers, chills or abnormal weight loss Eyes: Denies blurriness of vision Ears, nose, mouth, throat, and face: Denies mucositis or sore throat Respiratory: Denies cough, dyspnea or wheezes Cardiovascular: Denies palpitation, chest discomfort or lower extremity swelling Gastrointestinal:  Denies nausea, heartburn or change in bowel habits Skin: Denies abnormal skin rashes Lymphatics: Denies new lymphadenopathy or easy bruising Neurological:Denies numbness, tingling or new weaknesses Behavioral/Psych: Mood is stable, no new changes  All other systems were reviewed with the patient  and are negative.  I have reviewed the past medical history, past surgical history, social history and family history with the patient and they are unchanged from previous note.  ALLERGIES:  is allergic to omnipaque [iohexol]; calan [verapamil]; effexor [venlafaxine]; erythromycin; femara [letrozole]; fosamax [alendronate]; ibuprofen; paxil [paroxetine hcl]; remeron [mirtazapine]; tamoxifen; and vasotec [enalapril].  MEDICATIONS:  Current Outpatient Medications  Medication Sig Dispense Refill  . acetaminophen (TYLENOL) 500 MG tablet Take 500 mg by mouth daily as needed for moderate pain or headache.    Marland Kitchen aspirin 81 MG tablet Take 81 mg by mouth daily.      . Calcium Carbonate-Vitamin D (CALCIUM 600 + D PO) Take 1 tablet by mouth 2 (two) times daily.     . Cholecalciferol (VITAMIN D) 2000 units CAPS Take 2,000 Units by mouth every other day.    Marland Kitchen dexamethasone (DECADRON) 4 MG tablet Take 3 tabs at the night before and 3 tab the morning of chemotherapy, every 3 weeks, by mouth 6 tablet 0  . furosemide (LASIX) 20 MG tablet Once daily for 3 days, repeat as needed for lower extremity edema 30 tablet 1  . gabapentin (NEURONTIN) 300 MG capsule Take 1 capsule (300 mg total) by mouth 2 (two) times daily. (Patient not taking: Reported on 10/27/2018) 60 capsule 11  . levothyroxine (SYNTHROID, LEVOTHROID) 88 MCG tablet Take 1 tablet (88 mcg total) by mouth daily before breakfast. (Patient not taking: Reported on 10/27/2018) 90 tablet 1  . lidocaine-prilocaine (EMLA) cream Apply to affected area once (Patient not taking: Reported on 10/18/2018) 30 g 3  . loratadine (CLARITIN) 10 MG tablet Take 10 mg by mouth daily as needed for allergies.    Marland Kitchen loratadine-pseudoephedrine (CLARITIN-D 24 HOUR) 10-240 MG 24 hr tablet Take 1 tablet by mouth daily as needed for allergies.    . metFORMIN (GLUCOPHAGE-XR) 500 MG 24 hr tablet Take 1 tablet (500 mg total) by mouth 2 (two) times daily. 360 tablet 1  . Multiple Vitamin  (MULTIVITAMIN WITH MINERALS) TABS tablet Take 1 tablet by mouth daily.    . ondansetron (ZOFRAN) 8 MG tablet Take 1 tablet (8 mg total) by mouth every 8 (eight) hours as needed for refractory nausea / vomiting. Start on day 3 after chemo. 30 tablet 1  . pravastatin (PRAVACHOL) 40 MG tablet Take 1 tablet (40 mg total) by mouth every evening. 90 tablet 1  . prochlorperazine (COMPAZINE) 10 MG tablet Take 1 tablet (10 mg total) by mouth every 6 (six) hours as needed (Nausea or vomiting). 30 tablet 1  . traMADol (ULTRAM) 50 MG tablet Take 1 tablet (50 mg total) by mouth 3 (three) times daily. Take 1/2 to 1 tablet every 4 hours as needed for severe Back Pain 90 tablet 0  . triamterene-hydrochlorothiazide (MAXZIDE-25) 37.5-25 MG tablet Take 1 tablet by mouth daily. Takes in the am     No current facility-administered medications for this visit.     PHYSICAL EXAMINATION: ECOG PERFORMANCE STATUS: 1 - Symptomatic but completely ambulatory  Vitals:   12/27/18 1159  BP: (!) 155/72  Pulse: 98  Resp: 17  Temp: 98.1 F (36.7 C)  SpO2: 99%   Filed Weights   12/27/18 1159  Weight: 146 lb 3.2 oz (66.3 kg)  GENERAL:alert, no distress and comfortable SKIN: skin color, texture, turgor are normal, no rashes or significant lesions EYES: normal, Conjunctiva are pink and non-injected, sclera clear OROPHARYNX:no exudate, no erythema and lips, buccal mucosa, and tongue normal  NECK: supple, thyroid normal size, non-tender, without nodularity LYMPH:  no palpable lymphadenopathy in the cervical, axillary or inguinal LUNGS: clear to auscultation and percussion with normal breathing effort HEART: regular rate & rhythm and no murmurs and no lower extremity edema ABDOMEN:abdomen soft, non-tender and normal bowel sounds Musculoskeletal:no cyanosis of digits and no clubbing  NEURO: alert & oriented x 3 with fluent speech, no focal motor/sensory deficits  LABORATORY DATA:  I have reviewed the data as listed     Component Value Date/Time   NA 138 12/27/2018 1052   NA 137 05/31/2017 0931   K 3.4 (L) 12/27/2018 1052   K 3.5 05/31/2017 0931   CL 97 (L) 12/27/2018 1052   CL 99 12/20/2012 1336   CO2 30 12/27/2018 1052   CO2 25 05/31/2017 0931   GLUCOSE 150 (H) 12/27/2018 1052   GLUCOSE 161 (H) 05/31/2017 0931   GLUCOSE 110 (H) 12/20/2012 1336   BUN 13 12/27/2018 1052   BUN 10.9 05/31/2017 0931   CREATININE 0.71 12/27/2018 1052   CREATININE 0.63 07/05/2018 1009   CREATININE 0.8 05/31/2017 0931   CALCIUM 9.8 12/27/2018 1052   CALCIUM 10.6 (H) 05/31/2017 0931   PROT 7.4 12/27/2018 1052   PROT 7.5 05/31/2017 0931   ALBUMIN 3.6 12/27/2018 1052   ALBUMIN 4.2 05/31/2017 0931   AST 13 (L) 12/27/2018 1052   AST 16 05/31/2017 0931   ALT 10 12/27/2018 1052   ALT 15 05/31/2017 0931   ALKPHOS 89 12/27/2018 1052   ALKPHOS 91 05/31/2017 0931   BILITOT 0.4 12/27/2018 1052   BILITOT 0.33 05/31/2017 0931   GFRNONAA >60 12/27/2018 1052   GFRNONAA 87 07/05/2018 1009   GFRAA >60 12/27/2018 1052   GFRAA 100 07/05/2018 1009    No results found for: SPEP, UPEP  Lab Results  Component Value Date   WBC 6.6 12/27/2018   NEUTROABS 6.0 12/27/2018   HGB 11.0 (L) 12/27/2018   HCT 34.3 (L) 12/27/2018   MCV 77.3 (L) 12/27/2018   PLT 369 12/27/2018      Chemistry      Component Value Date/Time   NA 138 12/27/2018 1052   NA 137 05/31/2017 0931   K 3.4 (L) 12/27/2018 1052   K 3.5 05/31/2017 0931   CL 97 (L) 12/27/2018 1052   CL 99 12/20/2012 1336   CO2 30 12/27/2018 1052   CO2 25 05/31/2017 0931   BUN 13 12/27/2018 1052   BUN 10.9 05/31/2017 0931   CREATININE 0.71 12/27/2018 1052   CREATININE 0.63 07/05/2018 1009   CREATININE 0.8 05/31/2017 0931      Component Value Date/Time   CALCIUM 9.8 12/27/2018 1052   CALCIUM 10.6 (H) 05/31/2017 0931   ALKPHOS 89 12/27/2018 1052   ALKPHOS 91 05/31/2017 0931   AST 13 (L) 12/27/2018 1052   AST 16 05/31/2017 0931   ALT 10 12/27/2018 1052   ALT 15  05/31/2017 0931   BILITOT 0.4 12/27/2018 1052   BILITOT 0.33 05/31/2017 0931       RADIOGRAPHIC STUDIES: I have personally reviewed the radiological images as listed and agreed with the findings in the report. Ct Chest W Contrast  Result Date: 12/05/2018 CLINICAL DATA:  Metastatic endometrial cancer. History of breast cancer. Ongoing chemotherapy. Prior radiation therapy to  the left hip region. Left hip and anterior left leg pain for 1 week. EXAM: CT CHEST, ABDOMEN, AND PELVIS WITH CONTRAST TECHNIQUE: Multidetector CT imaging of the chest, abdomen and pelvis was performed following the standard protocol during bolus administration of intravenous contrast. CONTRAST:  150m OMNIPAQUE IOHEXOL 300 MG/ML  SOLN By report the patient developed hives on the face, lips, hands, and like after IV contrast administration. KRowe Robert PA help to manage this minor reaction. By report the patient was given 25 mg of oral Benadryl and health for 20 minutes prior to release. COMPARISON:  08/29/2018 FINDINGS: CT CHEST FINDINGS Cardiovascular: Right Port-A-Cath tip: Superior vena cava. Atherosclerotic calcification of the aortic arch and left main coronary artery. Mediastinum/Nodes: 2.7 by 1.4 cm hypodense nodule in the left thyroid lobe; this was worked up back in 2008 by biopsy. Lungs/Pleura: Mild scarring or atelectasis in the posterior basal segment left lower lobe. The previous pleural-based nodularity in the right middle lobe medially has nearly completely resolved and has more of a scar-like appearance, measuring about 4 mm in thickness on image 101/6, formerly up to about 11 mm in thickness. Musculoskeletal: Mild thoracic spondylosis. Rounded density compatible with prior lumpectomy in the left lateral breast. CT ABDOMEN PELVIS FINDINGS Hepatobiliary: Unremarkable Pancreas: Unremarkable Spleen: Unremarkable Adrenals/Urinary Tract: 2.0 by 1.6 cm fluid density lesion of the left kidney upper pole. Indistinct new 1.4  by 1.4 by 1.6 cm lesion in the left mid kidney, hypodense on portal venous phase images but hyperdense on delayed phase images, raising the possibility of prolonged tubular transit. Stomach/Bowel: Unremarkable Vascular/Lymphatic: Aortoiliac atherosclerotic vascular disease. Reproductive: Uterus absent.  Adnexa unremarkable. Other: No supplemental non-categorized findings. Musculoskeletal: Considerable increase in cystic degeneration of the large destructive mass of the left iliac bone. New or progressive left iliac bone pathologic fracture extending into the left sacroiliac joint. Soft tissue components of the tumor extended into the iliacus and gluteus medius muscles. The overall size of the lesion is roughly similar to prior. There is abnormal subluxation at the pubic symphysis compared to previous exams, with the left pubic bone about 6 mm posterior to the right, and with the left pubic bone about 6 mm superior to the right. The appearance suggests instability. 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 Degenerative spurring of both hip joints. IMPRESSION: 1. Considerable cystic degeneration in the large destructive mass of the left iliac bone. New or progressive left iliac bone pathologic fracture extending into the left SI joint, along with abnormal subluxation at the pubic symphysis indicating pubic symphysis instability. 2. Previous pleural-based nodularity in the right middle lobe is markedly improved, currently 4 mm in thickness and previously 11 mm in thickness. 3. Indistinct new wedge-shaped lesion in the left mid kidney, hypodense on portal venous phase images and hyperdense on delayed phase images, suggesting prolong tubular transit. This is a nonspecific finding but could reflect local inflammation, infection, or less likely infiltrative tumor. 4. Other imaging findings of potential clinical significance: Aortic Atherosclerosis (ICD10-I70.0). Left main coronary artery  atherosclerosis. 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 Electronically Signed   By: WVan ClinesM.D.   On: 12/05/2018 15:35   Ct Abdomen Pelvis W Contrast  Result Date: 12/05/2018 CLINICAL DATA:  Metastatic endometrial cancer. History of breast cancer. Ongoing chemotherapy. Prior radiation therapy to the left hip region. Left hip and anterior left leg pain for 1 week.  EXAM: CT CHEST, ABDOMEN, AND PELVIS WITH CONTRAST TECHNIQUE: Multidetector CT imaging of the chest, abdomen and pelvis was performed following the standard protocol during bolus administration of intravenous contrast. CONTRAST:  123m OMNIPAQUE IOHEXOL 300 MG/ML  SOLN By report the patient developed hives on the face, lips, hands, and like after IV contrast administration. KRowe Robert PA help to manage this minor reaction. By report the patient was given 25 mg of oral Benadryl and health for 20 minutes prior to release. COMPARISON:  08/29/2018 FINDINGS: CT CHEST FINDINGS Cardiovascular: Right Port-A-Cath tip: Superior vena cava. Atherosclerotic calcification of the aortic arch and left main coronary artery. Mediastinum/Nodes: 2.7 by 1.4 cm hypodense nodule in the left thyroid lobe; this was worked up back in 2008 by biopsy. Lungs/Pleura: Mild scarring or atelectasis in the posterior basal segment left lower lobe. The previous pleural-based nodularity in the right middle lobe medially has nearly completely resolved and has more of a scar-like appearance, measuring about 4 mm in thickness on image 101/6, formerly up to about 11 mm in thickness. Musculoskeletal: Mild thoracic spondylosis. Rounded density compatible with prior lumpectomy in the left lateral breast. CT ABDOMEN PELVIS FINDINGS Hepatobiliary: Unremarkable Pancreas: Unremarkable Spleen: Unremarkable Adrenals/Urinary Tract: 2.0 by 1.6 cm fluid density lesion of the left  kidney upper pole. Indistinct new 1.4 by 1.4 by 1.6 cm lesion in the left mid kidney, hypodense on portal venous phase images but hyperdense on delayed phase images, raising the possibility of prolonged tubular transit. Stomach/Bowel: Unremarkable Vascular/Lymphatic: Aortoiliac atherosclerotic vascular disease. Reproductive: Uterus absent.  Adnexa unremarkable. Other: No supplemental non-categorized findings. Musculoskeletal: Considerable increase in cystic degeneration of the large destructive mass of the left iliac bone. New or progressive left iliac bone pathologic fracture extending into the left sacroiliac joint. Soft tissue components of the tumor extended into the iliacus and gluteus medius muscles. The overall size of the lesion is roughly similar to prior. There is abnormal subluxation at the pubic symphysis compared to previous exams, with the left pubic bone about 6 mm posterior to the right, and with the left pubic bone about 6 mm superior to the right. The appearance suggests instability. 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 Degenerative spurring of both hip joints. IMPRESSION: 1. Considerable cystic degeneration in the large destructive mass of the left iliac bone. New or progressive left iliac bone pathologic fracture extending into the left SI joint, along with abnormal subluxation at the pubic symphysis indicating pubic symphysis instability. 2. Previous pleural-based nodularity in the right middle lobe is markedly improved, currently 4 mm in thickness and previously 11 mm in thickness. 3. Indistinct new wedge-shaped lesion in the left mid kidney, hypodense on portal venous phase images and hyperdense on delayed phase images, suggesting prolong tubular transit. This is a nonspecific finding but could reflect local inflammation, infection, or less likely infiltrative tumor. 4. Other imaging findings of potential clinical significance: Aortic Atherosclerosis (ICD10-I70.0).  Left main coronary artery atherosclerosis. 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 Electronically Signed   By: WVan ClinesM.D.   On: 12/05/2018 15:35   Xr Hip Unilat W Or W/o Pelvis 2-3 Views Left  Result Date: 12/12/2018 An AP pelvis and the lateral left hip shows no acute findings with the hip joint itself on either the right or left hips.  The destructive lesion in the left ilium is not as well delineated on plain  films.   All questions were answered. The patient knows to call the clinic with any problems, questions or concerns. No barriers to learning was detected.  I spent 15 minutes counseling the patient face to face. The total time spent in the appointment was 20 minutes and more than 50% was on counseling and review of test results  [name], MD 12/27/2018 12:32 PM

## 2018-12-27 NOTE — Assessment & Plan Note (Addendum)
Overall, she tolerated treatment very well She has lost 6 pounds of fluid weight and the leg swelling has resolved We will proceed with treatment with similar dose adjustment as before I plan total 6 cycles of chemo before repeat imaging study After that, we will consider maintenance treatment with bevacizumab or anti-estrogen treatment

## 2018-12-27 NOTE — Assessment & Plan Note (Signed)
She has minimum pain, well controlled with over-the-counter Tylenol only She will continue the same

## 2018-12-27 NOTE — Assessment & Plan Note (Signed)
Her blood pressure control is suboptimal I recommend increasing her blood pressure medicine to full strength 1 tablet a day

## 2018-12-27 NOTE — Patient Instructions (Signed)
    Cancer Center Discharge Instructions for Patients Receiving Chemotherapy  Today you received the following chemotherapy agents Taxol and Carboplatin   To help prevent nausea and vomiting after your treatment, we encourage you to take your nausea medication as directed.    If you develop nausea and vomiting that is not controlled by your nausea medication, call the clinic.   BELOW ARE SYMPTOMS THAT SHOULD BE REPORTED IMMEDIATELY:  *FEVER GREATER THAN 100.5 F  *CHILLS WITH OR WITHOUT FEVER  NAUSEA AND VOMITING THAT IS NOT CONTROLLED WITH YOUR NAUSEA MEDICATION  *UNUSUAL SHORTNESS OF BREATH  *UNUSUAL BRUISING OR BLEEDING  TENDERNESS IN MOUTH AND THROAT WITH OR WITHOUT PRESENCE OF ULCERS  *URINARY PROBLEMS  *BOWEL PROBLEMS  UNUSUAL RASH Items with * indicate a potential emergency and should be followed up as soon as possible.  Feel free to call the clinic should you have any questions or concerns. The clinic phone number is [phone].  Please show the CHEMO ALERT CARD at check-in to the Emergency Department and triage nurse.   

## 2018-12-27 NOTE — Assessment & Plan Note (Signed)
This has resolved.  I recommend discontinuation of furosemide

## 2018-12-28 LAB — CA 125: Cancer Antigen (CA) 125: 5.6 U/mL (ref 0.0–38.1)

## 2018-12-30 ENCOUNTER — Telehealth: Payer: Self-pay | Admitting: *Deleted

## 2018-12-30 NOTE — Telephone Encounter (Signed)
CALLED PATIENT TO ALTER FU APPT. FOR 01-02-19 DUE TO DR. KINARD GOING TO THE OR, PATIENT AGREED TO COME IN @ 9:30 AM

## 2019-01-02 ENCOUNTER — Encounter: Payer: Self-pay | Admitting: Radiation Oncology

## 2019-01-02 ENCOUNTER — Ambulatory Visit
Admission: RE | Admit: 2019-01-02 | Discharge: 2019-01-02 | Disposition: A | Discharge status: Home / Self Care | Payer: Medicare Other | Source: Ambulatory Visit | Attending: Radiation Oncology | Admitting: Radiation Oncology

## 2019-01-02 ENCOUNTER — Other Ambulatory Visit: Payer: Self-pay

## 2019-01-02 VITALS — BP 158/57 | HR 82 | Temp 97.9°F | Resp 18 | Ht 62.0 in | Wt 143.4 lb

## 2019-01-02 DIAGNOSIS — Z79899 Other long term (current) drug therapy: Secondary | ICD-10-CM | POA: Diagnosis not present

## 2019-01-02 DIAGNOSIS — I2510 Atherosclerotic heart disease of native coronary artery without angina pectoris: Secondary | ICD-10-CM | POA: Insufficient documentation

## 2019-01-02 DIAGNOSIS — Z5111 Encounter for antineoplastic chemotherapy: Secondary | ICD-10-CM | POA: Insufficient documentation

## 2019-01-02 DIAGNOSIS — Z7982 Long term (current) use of aspirin: Secondary | ICD-10-CM | POA: Insufficient documentation

## 2019-01-02 DIAGNOSIS — C541 Malignant neoplasm of endometrium: Secondary | ICD-10-CM | POA: Insufficient documentation

## 2019-01-02 DIAGNOSIS — Z7984 Long term (current) use of oral hypoglycemic drugs: Secondary | ICD-10-CM | POA: Insufficient documentation

## 2019-01-02 DIAGNOSIS — C7951 Secondary malignant neoplasm of bone: Secondary | ICD-10-CM

## 2019-01-02 NOTE — Progress Notes (Signed)
Radiation Oncology         [phone] ________________________________  Name: Tammy Hensley MRN: [id number]  Date: 01/02/2019  DOB: 1941-04-10  Follow-Up Visit Note  CC: [name], MD  Nancy Marus, MD    ICD-10-CM   1. 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 Pelvis / 45 Gy in 25 fractions / IMRT 2. Vaginal Cuff / 18 Gy in 3 fractions / HDR, Ir-192  12/2005 - 01/2006: Left breast with boost / 62.4 Gy   Narrative:  The patient returns today for routine follow-up. She had her last chemotherapy treatment on February 4. She is unaccompanied today. She is tolerating chemotherapy well, but it has caused a constant bad taste in her mouth. She reports taking 0.5 Tramadol on rainy days for hip pain management. Otherwise she is using OTC medication. Her left hip pain is markedly improved from her last visit. She is able to ambulate without use of a cane. She reports bilateral ankle swelling has improved.   Since she was last seen in the office, she had a CT with contrast of her chest/abdomen/pelvis on January 13 which showed considerable cystic degeneration in the large destructive mass of the left iliac bone. New or progressive left iliac bone pathologic fracture extending into the left SI joint, along with abnormal subluxation at the pubic symphysis indicating pubic symphysis instability. There was also markedly improved previous pleural-based nodularity in the right middle lobe, currently 4 mm in thickness and previously 11 mm in thickness. Indistinct new wedge-shaped lesion in the left mid kidney, hypodense on portal venous phase images  and hyperdense on delayed phase images, suggesting prolong tubular transit was noted. Other imaging findings of potential clinical significance: Aortic Atherosclerosis (ICD10-I70.0). Left main coronary artery atherosclerosis. 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  She had an X-ray of her left hip on January 20, of which Dr. Ninfa Linden reported no acute findings with the hip joint itself on either the right or left hips; the destructive lesion in  the left ilium is not as well delineated on plain films.                         ALLERGIES:  is allergic to omnipaque [iohexol]; calan [verapamil]; effexor [venlafaxine]; erythromycin; femara [letrozole]; fosamax [alendronate]; ibuprofen; paxil [paroxetine hcl]; remeron [mirtazapine]; tamoxifen; and vasotec [enalapril].  Meds: Current Outpatient Medications  Medication Sig Dispense Refill  . acetaminophen (TYLENOL) 500 MG tablet Take 500 mg by mouth daily as needed for moderate pain or headache.    Marland Kitchen aspirin 81 MG tablet Take 81 mg by mouth daily.      . Calcium Carbonate-Vitamin D (CALCIUM 600 + D PO) Take 1 tablet by mouth 2 (two) times daily.     . Cholecalciferol (VITAMIN D) 2000 units CAPS Take 2,000 Units by mouth every other day.    Marland Kitchen dexamethasone (DECADRON) 4 MG tablet Take 3 tabs at the night before and 3 tab the morning of chemotherapy, every  3 weeks, by mouth 6 tablet 0  . furosemide (LASIX) 20 MG tablet Once daily for 3 days, repeat as needed for lower extremity edema 30 tablet 1  . levothyroxine (SYNTHROID, LEVOTHROID) 88 MCG tablet Take 1 tablet (88 mcg total) by mouth daily before breakfast. 90 tablet 1  . lidocaine-prilocaine (EMLA) cream Apply to affected area once 30 g 3  . loratadine (CLARITIN) 10 MG tablet Take 10 mg by mouth daily as needed for allergies.    Marland Kitchen loratadine-pseudoephedrine (CLARITIN-D 24 HOUR) 10-240 MG 24 hr  tablet Take 1 tablet by mouth daily as needed for allergies.    . metFORMIN (GLUCOPHAGE-XR) 500 MG 24 hr tablet Take 1 tablet (500 mg total) by mouth 2 (two) times daily. 360 tablet 1  . Multiple Vitamin (MULTIVITAMIN WITH MINERALS) TABS tablet Take 1 tablet by mouth daily.    . ondansetron (ZOFRAN) 8 MG tablet Take 1 tablet (8 mg total) by mouth every 8 (eight) hours as needed for refractory nausea / vomiting. Start on day 3 after chemo. 30 tablet 1  . pravastatin (PRAVACHOL) 40 MG tablet Take 1 tablet (40 mg total) by mouth every evening. 90 tablet 1  . prochlorperazine (COMPAZINE) 10 MG tablet Take 1 tablet (10 mg total) by mouth every 6 (six) hours as needed (Nausea or vomiting). 30 tablet 1  . traMADol (ULTRAM) 50 MG tablet Take 1 tablet (50 mg total) by mouth 3 (three) times daily. Take 1/2 to 1 tablet every 4 hours as needed for severe Back Pain 90 tablet 0  . triamterene-hydrochlorothiazide (MAXZIDE-25) 37.5-25 MG tablet Take 1 tablet by mouth daily. Takes in the am    . gabapentin (NEURONTIN) 300 MG capsule Take 1 capsule (300 mg total) by mouth 2 (two) times daily. (Patient not taking: Reported on 10/27/2018) 60 capsule 11   No current facility-administered medications for this encounter.    Facility-Administered Medications Ordered in Other Encounters  Medication Dose Route Frequency Provider Last Rate Last Dose  . sodium chloride flush (NS) 0.9 % injection 10 mL  10 mL Intracatheter PRN [name], MD   10 mL at 12/27/18 1825    Physical Findings: The patient is in no acute distress. Patient is alert and oriented.  height is 5\' 2"  (1.575 m) and weight is 143 lb 6 oz (65 kg). Her oral temperature is 97.9 F (36.6 C). Her blood pressure is 158/57 (abnormal) and her pulse is 82. Her respiration is 18 and oxygen saturation is 100%.  Lungs are clear to auscultation bilaterally. Heart has regular rate and rhythm. No palpable cervical, supraclavicular, or axillary adenopathy. Abdomen soft,  non-tender, normal bowel sounds.  Lab Findings: Lab Results  Component Value Date   WBC 6.6 12/27/2018   HGB 11.0 (L) 12/27/2018   HCT 34.3 (L) 12/27/2018   MCV 77.3 (L) 12/27/2018   PLT 369 12/27/2018    Radiographic Findings: Ct Chest W Contrast  Result Date: 12/05/2018 CLINICAL DATA:  Metastatic endometrial cancer. History of breast cancer. Ongoing chemotherapy. Prior radiation therapy to the left hip region. Left hip and anterior left leg pain for 1 week. EXAM: CT CHEST, ABDOMEN, AND PELVIS WITH CONTRAST TECHNIQUE: Multidetector CT imaging of the chest, abdomen and pelvis was performed following the standard protocol during bolus administration of intravenous contrast. CONTRAST:  169mL OMNIPAQUE IOHEXOL 300 MG/ML  SOLN By report the patient developed hives on the face, lips, hands, and like after IV contrast administration. Rowe Robert, PA help to manage this minor reaction.  By report the patient was given 25 mg of oral Benadryl and health for 20 minutes prior to release. COMPARISON:  08/29/2018 FINDINGS: CT CHEST FINDINGS Cardiovascular: Right Port-A-Cath tip: Superior vena cava. Atherosclerotic calcification of the aortic arch and left main coronary artery. Mediastinum/Nodes: 2.7 by 1.4 cm hypodense nodule in the left thyroid lobe; this was worked up back in 2008 by biopsy. Lungs/Pleura: Mild scarring or atelectasis in the posterior basal segment left lower lobe. The previous pleural-based nodularity in the right middle lobe medially has nearly completely resolved and has more of a scar-like appearance, measuring about 4 mm in thickness on image 101/6, formerly up to about 11 mm in thickness. Musculoskeletal: Mild thoracic spondylosis. Rounded density compatible with prior lumpectomy in the left lateral breast. CT ABDOMEN PELVIS FINDINGS Hepatobiliary: Unremarkable Pancreas: Unremarkable Spleen: Unremarkable Adrenals/Urinary Tract: 2.0 by 1.6 cm fluid density lesion of the left kidney upper  pole. Indistinct new 1.4 by 1.4 by 1.6 cm lesion in the left mid kidney, hypodense on portal venous phase images but hyperdense on delayed phase images, raising the possibility of prolonged tubular transit. Stomach/Bowel: Unremarkable Vascular/Lymphatic: Aortoiliac atherosclerotic vascular disease. Reproductive: Uterus absent.  Adnexa unremarkable. Other: No supplemental non-categorized findings. Musculoskeletal: Considerable increase in cystic degeneration of the large destructive mass of the left iliac bone. New or progressive left iliac bone pathologic fracture extending into the left sacroiliac joint. Soft tissue components of the tumor extended into the iliacus and gluteus medius muscles. The overall size of the lesion is roughly similar to prior. There is abnormal subluxation at the pubic symphysis compared to previous exams, with the left pubic bone about 6 mm posterior to the right, and with the left pubic bone about 6 mm superior to the right. The appearance suggests instability. 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 Degenerative spurring of both hip joints. IMPRESSION: 1. Considerable cystic degeneration in the large destructive mass of the left iliac bone. New or progressive left iliac bone pathologic fracture extending into the left SI joint, along with abnormal subluxation at the pubic symphysis indicating pubic symphysis instability. 2. Previous pleural-based nodularity in the right middle lobe is markedly improved, currently 4 mm in thickness and previously 11 mm in thickness. 3. Indistinct new wedge-shaped lesion in the left mid kidney, hypodense on portal venous phase images and hyperdense on delayed phase images, suggesting prolong tubular transit. This is a nonspecific finding but could reflect local inflammation, infection, or less likely infiltrative tumor. 4. Other imaging findings of potential clinical significance: Aortic Atherosclerosis (ICD10-I70.0). Left main  coronary artery atherosclerosis. 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 Electronically Signed   By: Van Clines M.D.   On: 12/05/2018 15:35   Ct Abdomen Pelvis W Contrast  Result Date: 12/05/2018 CLINICAL DATA:  Metastatic endometrial cancer. History of breast cancer. Ongoing chemotherapy. Prior radiation therapy to the left hip region. Left hip and anterior left leg pain for 1 week. EXAM: CT CHEST, ABDOMEN, AND PELVIS WITH CONTRAST TECHNIQUE: Multidetector CT imaging of the chest, abdomen and pelvis was performed following the standard protocol during bolus administration of intravenous contrast. CONTRAST:  162mL OMNIPAQUE IOHEXOL 300 MG/ML  SOLN By report the patient developed hives on the face, lips, hands, and like after IV contrast administration. Rowe Robert, PA help to manage this minor reaction. By report the patient was given 25 mg of oral Benadryl and health for  20 minutes prior to release. COMPARISON:  08/29/2018 FINDINGS: CT CHEST FINDINGS Cardiovascular: Right Port-A-Cath tip: Superior vena cava. Atherosclerotic calcification of the aortic arch and left main coronary artery. Mediastinum/Nodes: 2.7 by 1.4 cm hypodense nodule in the left thyroid lobe; this was worked up back in 2008 by biopsy. Lungs/Pleura: Mild scarring or atelectasis in the posterior basal segment left lower lobe. The previous pleural-based nodularity in the right middle lobe medially has nearly completely resolved and has more of a scar-like appearance, measuring about 4 mm in thickness on image 101/6, formerly up to about 11 mm in thickness. Musculoskeletal: Mild thoracic spondylosis. Rounded density compatible with prior lumpectomy in the left lateral breast. CT ABDOMEN PELVIS FINDINGS Hepatobiliary: Unremarkable Pancreas: Unremarkable Spleen: Unremarkable Adrenals/Urinary Tract: 2.0 by 1.6 cm fluid density lesion  of the left kidney upper pole. Indistinct new 1.4 by 1.4 by 1.6 cm lesion in the left mid kidney, hypodense on portal venous phase images but hyperdense on delayed phase images, raising the possibility of prolonged tubular transit. Stomach/Bowel: Unremarkable Vascular/Lymphatic: Aortoiliac atherosclerotic vascular disease. Reproductive: Uterus absent.  Adnexa unremarkable. Other: No supplemental non-categorized findings. Musculoskeletal: Considerable increase in cystic degeneration of the large destructive mass of the left iliac bone. New or progressive left iliac bone pathologic fracture extending into the left sacroiliac joint. Soft tissue components of the tumor extended into the iliacus and gluteus medius muscles. The overall size of the lesion is roughly similar to prior. There is abnormal subluxation at the pubic symphysis compared to previous exams, with the left pubic bone about 6 mm posterior to the right, and with the left pubic bone about 6 mm superior to the right. The appearance suggests instability. 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 Degenerative spurring of both hip joints. IMPRESSION: 1. Considerable cystic degeneration in the large destructive mass of the left iliac bone. New or progressive left iliac bone pathologic fracture extending into the left SI joint, along with abnormal subluxation at the pubic symphysis indicating pubic symphysis instability. 2. Previous pleural-based nodularity in the right middle lobe is markedly improved, currently 4 mm in thickness and previously 11 mm in thickness. 3. Indistinct new wedge-shaped lesion in the left mid kidney, hypodense on portal venous phase images and hyperdense on delayed phase images, suggesting prolong tubular transit. This is a nonspecific finding but could reflect local inflammation, infection, or less likely infiltrative tumor. 4. Other imaging findings of potential clinical significance: Aortic Atherosclerosis  (ICD10-I70.0). Left main coronary artery atherosclerosis. 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 Electronically Signed   By: Van Clines M.D.   On: 12/05/2018 15:35   Xr Hip Unilat W Or W/o Pelvis 2-3 Views Left  Result Date: 12/12/2018 An AP pelvis and the lateral left hip shows no acute findings with the hip joint itself on either the right or left hips.  The destructive lesion in the left ilium is not as well delineated on plain films.   Impression: Recurrent endometrial cancer. Pt is doing well with her chemotherapy. Pt's scan show pathologic fx of the left SI joint, but she is not having any pain from this issue.   Plan: PRN follow up in radiation oncology. Patient will continue close follow up with medical oncology and continue on chemotherapy. -----------------------------------  Blair Promise, PhD, MD  This document serves as a record of services personally performed by [name], MD. It  was created on his behalf by Wyn Quaker, a trained medical scribe. The creation of this record is based on the scribe's personal observations and the provider's statements to them. This document has been checked and approved by the attending provider.

## 2019-01-02 NOTE — Progress Notes (Signed)
Pt presents today for f/u with Dr. Sondra Come. Pt denies c/o pain. Pt had last chemotherapy on 12/27/18. Pt denies dysuria/hematuria. Pt denies vaginal bleeding/discharge. Pt reports slight constipation after every chemotherapy infusion, but is relieved by laxative. Pt reports hip feels much better since completing radiation therapy to hip.   BP (!) 158/57 (BP Location: Right Arm, Patient Position: Sitting)   Pulse 82   Temp 97.9 F (36.6 C) (Oral)   Resp 18   Ht 5\' 2"  (1.575 m)   Wt 143 lb 6 oz (65 kg)   SpO2 100%   BMI 26.22 kg/m   Wt Readings from Last 3 Encounters:  01/02/19 143 lb 6 oz (65 kg)  12/27/18 146 lb 3.2 oz (66.3 kg)  12/06/18 152 lb 3.2 oz (69 kg)   Loma Sousa, RN BSN

## 2019-01-16 ENCOUNTER — Encounter: Payer: Self-pay | Admitting: Internal Medicine

## 2019-01-16 NOTE — Patient Instructions (Signed)

## 2019-01-16 NOTE — Progress Notes (Signed)
This very nice 78 y.o. [name] presents for 6 month follow up with HTN, HLD, T2_NIDDM and Vitamin D Deficiency.      Patient has hx/o remote  Lt Breast Cancer (2006) tx'd with Tamoxifen 5 yrs and  dx'd Oct 2017 with Uterine Cancer undergoing TAH/BSO followed by Chemoradiation.  Then in Oct 2019, she was discovered to have poorly differentiated Uterine Cancer metastasized to her pelvis & Chemo and Radiation therapies were re-instituted by Dr's Alvy Bimler & Kinard.      Patient is treated for HTN (1995) & BP has been controlled at home. Today's BP is at goal -  126/80. Patient has had no complaints of any cardiac type chest pain, palpitations, dyspnea / orthopnea / PND, dizziness, claudication, or dependent edema.     Hyperlipidemia is controlled with diet & meds. Patient denies myalgias or other med SE's. 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  She reports Fasting & occasional CBG's usually range betw 80-90's.  Last A1c was at goal: Lab Results  Component Value Date   HGBA1C 5.9 (H) 07/05/2018      Patient was initiated on replacement thyroid meds in 2003.      Further, the patient also has history of Vitamin D Deficiency and supplements vitamin D without any suspected side-effects. Last vitamin D was at goal: Lab Results  Component Value Date   VD25OH 92 07/05/2018   Current Outpatient Medications on File Prior to Visit  Medication Sig  . acetaminophen (TYLENOL) 500 MG tablet Take 500 mg by mouth daily as needed for moderate pain or headache.  Marland Kitchen aspirin 81 MG tablet Take 81 mg by mouth daily.    . Calcium Carbonate-Vitamin D (CALCIUM 600 + D PO) Take 1 tablet by mouth 2 (two) times daily.    . Cholecalciferol (VITAMIN D) 2000 units CAPS Take 2,000 Units by mouth every other day.  Marland Kitchen dexamethasone (DECADRON) 4 MG tablet Take 3 tabs at the night before and 3 tab the morning of chemotherapy, every 3 weeks, by mouth  . furosemide (LASIX) 20 MG tablet Once daily for 3 days, repeat as needed for lower extremity edema  . levothyroxine (SYNTHROID, LEVOTHROID) 88 MCG tablet Take 1 tablet (88 mcg total) by mouth daily before breakfast.  . lidocaine-prilocaine (EMLA) cream Apply to affected area once  . loratadine (CLARITIN) 10 MG tablet Take 10 mg by mouth daily as needed for allergies.  Marland Kitchen loratadine-pseudoephedrine (CLARITIN-D 24 HOUR) 10-240 MG 24 hr tablet Take 1 tablet by mouth daily as needed for allergies.  . metFORMIN (GLUCOPHAGE-XR) 500 MG 24 hr tablet Take 1 tablet (500 mg total) by mouth 2 (two) times daily.  . Multiple Vitamin (MULTIVITAMIN WITH MINERALS) TABS tablet Take 1 tablet by mouth daily.  . ondansetron (ZOFRAN) 8 MG tablet Take 1 tablet (8 mg total) by mouth every 8 (eight) hours as needed for refractory nausea / vomiting. Start on day 3 after chemo.  . pravastatin (PRAVACHOL) 40 MG tablet Take 1 tablet (40 mg total) by mouth every evening.  . prochlorperazine (COMPAZINE) 10 MG tablet Take 1 tablet (10 mg total) by mouth every 6 (six)  hours as needed (Nausea or vomiting).  . traMADol (ULTRAM) 50 MG tablet Take 1 tablet (50 mg total) by mouth 3 (three) times daily. Take 1/2 to 1 tablet every 4 hours as needed for severe Back Pain  . triamterene-hydrochlorothiazide (MAXZIDE-25) 37.5-25 MG tablet Take 1 tablet by mouth daily. Takes in the am   Current Facility-Administered Medications on File Prior to Visit  Medication  . sodium chloride flush (NS) 0.9 % injection 10 mL   Allergies  Allergen Reactions  . Omnipaque [Iohexol] Hives    Pt will need 13 hr prep in the future--SLG  . Calan [Verapamil]     Constipation   . Effexor [Venlafaxine]     unknown  . Erythromycin  Nausea And Vomiting  . Femara [Letrozole] Swelling  . Fosamax [Alendronate]     aching  . Ibuprofen     Dizziness, incoherent   . Paxil [Paroxetine Hcl] Nausea Only  . Remeron [Mirtazapine]     Weight gain   . Tamoxifen     Hair loss @ low dose  . Vasotec [Enalapril] Cough   PMHx:   Past Medical History:  Diagnosis Date  . Avascular necrosis of bones of both hips ([place]) 11/17/2016  . Breast cancer ([place]) 2006   left breast  . Depression   . Diabetes ([place])    borderline  . Endometrial ca ([place]) 2018  . Family history of cancer   . History of brachytherapy 03/29/17-04/12/17   vaginal cuff 18 Gy in 3 fractions  . History of radiation therapy 02/10/17-03/16/17   pelvis 45 Gy in 25 fractions  . Hyperlipidemia   . Hypertension   . Iron deficiency anemia   . Metastatic 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 ([place]) 2019   left hip  . Vitamin D deficiency    Immunization History  Administered Date(s) Administered  . Influenza, High Dose Seasonal PF 09/07/2017  . Influenza,inj,Quad PF,6+ Mos 11/12/2016, 09/08/2018  . Pneumococcal Conjugate-13 07/05/2018  . Pneumococcal-Unspecified 12/29/2011  . Td 12/29/2011   Past Surgical History:  Procedure Laterality Date  . BREAST LUMPECTOMY Left 2006  . BREAST SURGERY  2006   left breast lumpectomy- radiation, took Femara   . IR IMAGING GUIDED PORT INSERTION  09/29/2018  . 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 ROBOTIC ASSISTED LAP VAGINAL HYSTERECTOMY N/A 10/13/2016   Procedure: XI ROBOTIC ASSISTED LAPAROSCOPIC VAGINAL HYSTERECTOMY;  Surgeon: Nancy Marus, MD;  Location: [facility];  Service: Gynecology;  Laterality: N/A;  . ROBOTIC ASSISTED SALPINGO OOPHERECTOMY Bilateral 10/13/2016   Procedure: XI ROBOTIC ASSISTED SALPINGO OOPHORECTOMY;  Surgeon: Nancy Marus, MD;  Location: [facility];  Service: Gynecology;  Laterality: Bilateral;  . SENTINEL NODE BIOPSY N/A 10/13/2016   Procedure: SENTINEL  NODE BIOPSY;  Surgeon: Nancy Marus, MD;  Location: [facility];  Service: Gynecology;  Laterality: N/A;   FHx:    Reviewed / unchanged  SHx:    Reviewed / unchanged   Systems Review:  Constitutional: Denies fever, chills, wt changes, headaches, insomnia, fatigue, night sweats, change in appetite. Eyes: Denies redness, blurred vision, diplopia, discharge, itchy, watery eyes.  ENT: Denies discharge, congestion, post nasal drip, epistaxis, sore throat, earache, hearing loss, dental pain, tinnitus, vertigo, sinus pain, snoring.  CV: Denies chest pain, palpitations, irregular heartbeat, syncope, dyspnea, diaphoresis, orthopnea, PND, claudication or edema. Respiratory: denies cough, dyspnea, DOE, pleurisy, hoarseness, laryngitis, wheezing.  Gastrointestinal: Denies dysphagia, odynophagia, heartburn, reflux, water brash, abdominal pain  or cramps, nausea, vomiting, bloating, diarrhea, constipation, hematemesis, melena, hematochezia  or hemorrhoids. Genitourinary: Denies dysuria, frequency, urgency, nocturia, hesitancy, discharge, hematuria or flank pain. Musculoskeletal: Denies arthralgias, myalgias, stiffness, jt. swelling, pain, limping or strain/sprain.  Skin: Denies pruritus, rash, hives, warts, acne, eczema or change in skin lesion(s). Neuro: No weakness, tremor, incoordination, spasms, paresthesia or pain. Psychiatric: Denies confusion, memory loss or sensory loss. Endo: Denies change in weight, skin or hair change.  Heme/Lymph: No excessive bleeding, bruising or enlarged lymph nodes.  Physical Exam  BP 126/80   Pulse 100   Temp (!) 97 F (36.1 C)   Resp 16   Ht 5\' 2"  (1.575 m)   Wt 144 lb 12.8 oz (65.7 kg)   BMI 26.48 kg/m   Appears  well nourished, well groomed  and in no distress.  Eyes: PERRLA, EOMs, conjunctiva no swelling or erythema. Sinuses: No frontal/maxillary tenderness ENT/Mouth: EAC's clear, TM's nl w/o erythema, bulging. Nares clear w/o erythema, swelling, exudates.  Oropharynx clear without erythema or exudates. Oral hygiene is good. Tongue normal, non obstructing. Hearing intact.  Neck: Supple. Thyroid not palpable. Car 2+/2+ without bruits, nodes or JVD. Chest: Respirations nl with BS clear & equal w/o rales, rhonchi, wheezing or stridor.  Cor: Heart sounds normal w/ regular rate and rhythm without sig. murmurs, gallops, clicks or rubs. Peripheral pulses normal and equal  without edema.  Abdomen: Soft & bowel sounds normal. Non-tender w/o guarding, rebound, hernias, masses or organomegaly.  Lymphatics: Unremarkable.  Musculoskeletal: Full ROM all peripheral extremities, joint stability, 5/5 strength and normal gait.  Skin: Warm, dry without exposed rashes, lesions or ecchymosis apparent.  Neuro: Cranial nerves intact, reflexes equal bilaterally. Sensory-motor testing grossly intact. Tendon reflexes grossly intact.  Pysch: Alert & oriented x 3.  Insight and judgement nl & appropriate. No ideations.  Assessment and Plan:  1. Essential hypertension  - Continue medication, monitor blood pressure at home.  - Continue DASH diet.  Reminder to go to the ER if any CP,  SOB, nausea, dizziness, severe HA, changes vision/speech.  - COMPLETE METABOLIC PANEL WITH GFR - CBC with Differential/Platelet - Magnesium - TSH  2. Hyperlipidemia, mixed  - Continue diet/meds, exercise,& lifestyle modifications.  - Continue monitor periodic cholesterol/liver & renal functions   - Lipid panel - TSH  3. Type 2 diabetes mellitus with stage 2 chronic kidney disease, without long-term current use of insulin (HCC)  - Continue diet, exercise  - Lifestyle modifications.  - Monitor appropriate labs.  - Hemoglobin A1c - Insulin, random  4. Vitamin D deficiency  - Continue supplementation.   - VITAMIN D 25 Hydroxyl  5. Hypothyroidismpe  - TSH  6. Endometrial cancer ([place])   7. Medication management  - COMPLETE METABOLIC PANEL WITH GFR - CBC with  Differential/Platelet - Magnesium - Lipid panel - TSH - Hemoglobin A1c - Insulin, random - VITAMIN D 25 Hydroxyl      Patient deferred all labs today due to difficulty w/venipunctures.      Discussed  regular exercise, BP monitoring, weight control to achieve/maintain BMI less than 25 and discussed med and SE's. Recommended labs to assess and monitor clinical status with further disposition pending results of labs. Over 30 minutes of exam, counseling, chart review was performed.

## 2019-01-17 ENCOUNTER — Telehealth: Payer: Self-pay | Admitting: Hematology and Oncology

## 2019-01-17 ENCOUNTER — Inpatient Hospital Stay: Payer: Medicare Other

## 2019-01-17 ENCOUNTER — Ambulatory Visit: Payer: Medicare Other | Admitting: Internal Medicine

## 2019-01-17 ENCOUNTER — Inpatient Hospital Stay (HOSPITAL_BASED_OUTPATIENT_CLINIC_OR_DEPARTMENT_OTHER): Payer: Medicare Other | Admitting: Hematology and Oncology

## 2019-01-17 ENCOUNTER — Encounter: Payer: Self-pay | Admitting: Hematology and Oncology

## 2019-01-17 VITALS — BP 135/74 | HR 97 | Temp 97.7°F | Resp 18 | Ht 62.0 in | Wt 145.8 lb

## 2019-01-17 VITALS — BP 126/80 | HR 100 | Temp 97.0°F | Resp 16 | Ht 62.0 in | Wt 144.8 lb

## 2019-01-17 DIAGNOSIS — C7801 Secondary malignant neoplasm of right lung: Secondary | ICD-10-CM

## 2019-01-17 DIAGNOSIS — E1122 Type 2 diabetes mellitus with diabetic chronic kidney disease: Secondary | ICD-10-CM

## 2019-01-17 DIAGNOSIS — Z5111 Encounter for antineoplastic chemotherapy: Secondary | ICD-10-CM | POA: Diagnosis not present

## 2019-01-17 DIAGNOSIS — E559 Vitamin D deficiency, unspecified: Secondary | ICD-10-CM | POA: Diagnosis not present

## 2019-01-17 DIAGNOSIS — G893 Neoplasm related pain (acute) (chronic): Secondary | ICD-10-CM

## 2019-01-17 DIAGNOSIS — C541 Malignant neoplasm of endometrium: Secondary | ICD-10-CM

## 2019-01-17 DIAGNOSIS — E039 Hypothyroidism, unspecified: Secondary | ICD-10-CM

## 2019-01-17 DIAGNOSIS — I10 Essential (primary) hypertension: Secondary | ICD-10-CM | POA: Diagnosis not present

## 2019-01-17 DIAGNOSIS — Z79899 Other long term (current) drug therapy: Secondary | ICD-10-CM

## 2019-01-17 DIAGNOSIS — C7951 Secondary malignant neoplasm of bone: Secondary | ICD-10-CM

## 2019-01-17 DIAGNOSIS — D6481 Anemia due to antineoplastic chemotherapy: Secondary | ICD-10-CM

## 2019-01-17 DIAGNOSIS — T451X5A Adverse effect of antineoplastic and immunosuppressive drugs, initial encounter: Secondary | ICD-10-CM

## 2019-01-17 DIAGNOSIS — N182 Chronic kidney disease, stage 2 (mild): Secondary | ICD-10-CM

## 2019-01-17 DIAGNOSIS — E782 Mixed hyperlipidemia: Secondary | ICD-10-CM

## 2019-01-17 LAB — CMP (CANCER CENTER ONLY)
ALT: 8 U/L (ref 0–44)
ANION GAP: 13 (ref 5–15)
AST: 12 U/L — ABNORMAL LOW (ref 15–41)
Albumin: 3.8 g/dL (ref 3.5–5.0)
Alkaline Phosphatase: 70 U/L (ref 38–126)
BUN: 13 mg/dL (ref 8–23)
CO2: 29 mmol/L (ref 22–32)
Calcium: 9.8 mg/dL (ref 8.9–10.3)
Chloride: 95 mmol/L — ABNORMAL LOW (ref 98–111)
Creatinine: 0.78 mg/dL (ref 0.44–1.00)
GFR, Est AFR Am: >60 mL/min (ref >60)
GFR, Estimated: >60 mL/min (ref >60)
Glucose, Bld: 117 mg/dL — ABNORMAL HIGH (ref 70–99)
POTASSIUM: 3.6 mmol/L (ref 3.5–5.1)
Sodium: 137 mmol/L (ref 135–145)
Total Bilirubin: 0.3 mg/dL (ref 0.3–1.2)
Total Protein: 7.4 g/dL (ref 6.5–8.1)

## 2019-01-17 LAB — CBC WITH DIFFERENTIAL (CANCER CENTER ONLY)
Abs Immature Granulocytes: 0.03 10*3/uL (ref 0.00–0.07)
BASOS PCT: 0 %
Basophils Absolute: 0 10*3/uL (ref 0.0–0.1)
Eosinophils Absolute: 0 10*3/uL (ref 0.0–0.5)
Eosinophils Relative: 0 %
HCT: 32.9 % — ABNORMAL LOW (ref 36.0–46.0)
Hemoglobin: 10.4 g/dL — ABNORMAL LOW (ref 12.0–15.0)
Immature Granulocytes: 1 %
Lymphocytes Relative: 7 %
Lymphs Abs: 0.4 10*3/uL — ABNORMAL LOW (ref 0.7–4.0)
MCH: 24.6 pg — ABNORMAL LOW (ref 26.0–34.0)
MCHC: 31.6 g/dL (ref 30.0–36.0)
MCV: 77.8 fL — ABNORMAL LOW (ref 80.0–100.0)
Monocytes Absolute: 0.1 10*3/uL (ref 0.1–1.0)
Monocytes Relative: 2 %
NRBC: 0 % (ref 0.0–0.2)
Neutro Abs: 5.6 10*3/uL (ref 1.7–7.7)
Neutrophils Relative %: 90 %
Platelet Count: 349 10*3/uL (ref 150–400)
RBC: 4.23 MIL/uL (ref 3.87–5.11)
RDW: 17.8 % — ABNORMAL HIGH (ref 11.5–15.5)
WBC Count: 6.1 10*3/uL (ref 4.0–10.5)

## 2019-01-17 MED ORDER — FAMOTIDINE IN NACL 20-0.9 MG/50ML-% IV SOLN: 20.0000 mg | Freq: Once | INTRAVENOUS | Status: DC

## 2019-01-17 MED ORDER — HEPARIN SOD (PORK) LOCK FLUSH 100 UNIT/ML IV SOLN
250.0000 [IU] | Freq: Once | INTRAVENOUS | Status: DC | PRN
  Filled 2019-01-17: qty 5

## 2019-01-17 MED ORDER — SODIUM CHLORIDE 0.9 % IV SOLN
Freq: Once | INTRAVENOUS | Status: AC
  Administered 2019-01-17: 13:00:00 via INTRAVENOUS
  Filled 2019-01-17: qty 5

## 2019-01-17 MED ORDER — SODIUM CHLORIDE 0.9% FLUSH
10.0000 mL | Freq: Once | INTRAVENOUS | Status: AC
  Administered 2019-01-17: 10 mL
  Filled 2019-01-17: qty 10

## 2019-01-17 MED ORDER — HEPARIN SOD (PORK) LOCK FLUSH 100 UNIT/ML IV SOLN
500.0000 [IU] | Freq: Once | INTRAVENOUS | Status: AC | PRN
  Administered 2019-01-17: 500 [IU]
  Filled 2019-01-17: qty 5

## 2019-01-17 MED ORDER — SODIUM CHLORIDE 0.9 % IV SOLN
364.5000 mg | Freq: Once | INTRAVENOUS | Status: AC
  Administered 2019-01-17: 360 mg via INTRAVENOUS
  Filled 2019-01-17: qty 36

## 2019-01-17 MED ORDER — PALONOSETRON HCL INJECTION 0.25 MG/5ML
INTRAVENOUS | Status: AC
  Filled 2019-01-17: qty 5

## 2019-01-17 MED ORDER — PALONOSETRON HCL INJECTION 0.25 MG/5ML
0.2500 mg | Freq: Once | INTRAVENOUS | Status: AC
  Administered 2019-01-17: 0.25 mg via INTRAVENOUS

## 2019-01-17 MED ORDER — SODIUM CHLORIDE 0.9 % IV SOLN
20.0000 mg | Freq: Once | INTRAVENOUS | Status: AC
  Administered 2019-01-17: 20 mg via INTRAVENOUS
  Filled 2019-01-17: qty 2

## 2019-01-17 MED ORDER — SODIUM CHLORIDE 0.9 % IV SOLN
131.2500 mg/m2 | Freq: Once | INTRAVENOUS | Status: AC
  Administered 2019-01-17: 222 mg via INTRAVENOUS
  Filled 2019-01-17: qty 37

## 2019-01-17 MED ORDER — SODIUM CHLORIDE 0.9 % IV SOLN
Freq: Once | INTRAVENOUS | Status: AC
  Administered 2019-01-17: 12:00:00 via INTRAVENOUS
  Filled 2019-01-17: qty 250

## 2019-01-17 MED ORDER — SODIUM CHLORIDE 0.9 % IV SOLN
25.0000 mg | Freq: Once | INTRAVENOUS | Status: AC
  Administered 2019-01-17: 25 mg via INTRAVENOUS
  Filled 2019-01-17: qty 0.5

## 2019-01-17 MED ORDER — SODIUM CHLORIDE 0.9% FLUSH
10.0000 mL | INTRAVENOUS | Status: DC | PRN
  Administered 2019-01-17: 10 mL
  Filled 2019-01-17: qty 10

## 2019-01-17 NOTE — Patient Instructions (Signed)
    Cancer Center Discharge Instructions for Patients Receiving Chemotherapy  Today you received the following chemotherapy agents Taxol and Carboplatin   To help prevent nausea and vomiting after your treatment, we encourage you to take your nausea medication as directed.    If you develop nausea and vomiting that is not controlled by your nausea medication, call the clinic.   BELOW ARE SYMPTOMS THAT SHOULD BE REPORTED IMMEDIATELY:  *FEVER GREATER THAN 100.5 F  *CHILLS WITH OR WITHOUT FEVER  NAUSEA AND VOMITING THAT IS NOT CONTROLLED WITH YOUR NAUSEA MEDICATION  *UNUSUAL SHORTNESS OF BREATH  *UNUSUAL BRUISING OR BLEEDING  TENDERNESS IN MOUTH AND THROAT WITH OR WITHOUT PRESENCE OF ULCERS  *URINARY PROBLEMS  *BOWEL PROBLEMS  UNUSUAL RASH Items with * indicate a potential emergency and should be followed up as soon as possible.  Feel free to call the clinic should you have any questions or concerns. The clinic phone number is [phone].  Please show the CHEMO ALERT CARD at check-in to the Emergency Department and triage nurse.   

## 2019-01-17 NOTE — Assessment & Plan Note (Signed)
This is likely due to recent treatment. The patient denies recent history of bleeding such as epistaxis, hematuria or hematochezia. She is asymptomatic from the anemia. I will observe for now.  She does not require transfusion now. I will continue the chemotherapy at current dose without dosage adjustment.  If the anemia gets progressive worse in the future, I might have to delay her treatment or adjust the chemotherapy dose. I plan to repeat iron studies in her next visit

## 2019-01-17 NOTE — Assessment & Plan Note (Addendum)
Overall, she tolerated treatment very well We will proceed with treatment with similar dose adjustment as before I plan to repeat imaging study within a month After that, we will consider maintenance treatment with bevacizumab or anti-estrogen treatment

## 2019-01-17 NOTE — Progress Notes (Signed)
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([place]) Overall, she tolerated treatment very well We will proceed with treatment with similar dose adjustment as before I plan to repeat imaging study within a month After that, we will consider maintenance treatment with bevacizumab or anti-estrogen treatment  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 has minimum pain, well controlled with over-the-counter Tylenol only She will continue the same  Anemia due to antineoplastic chemotherapy This is likely due to recent treatment. The patient denies recent history of bleeding such as epistaxis, hematuria or hematochezia. She is asymptomatic from the anemia. I will observe for now.  She does not require transfusion now. I will continue the chemotherapy at current dose without dosage adjustment.  If the anemia gets progressive worse in the future, I might have to delay her treatment or adjust the chemotherapy dose. I plan to repeat iron studies in her next visit   Orders Placed This Encounter  Procedures  . CT ABDOMEN PELVIS W CONTRAST    Standing Status:   Future    Standing Expiration Date:   01/18/2020    Order Specific Question:   If indicated for the ordered procedure, I authorize the administration of contrast media per Radiology protocol    Answer:   Yes    Order Specific Question:   Preferred imaging location?    Answer:   [facility]    Order Specific Question:   Radiology Contrast Protocol - do NOT remove file path    Answer:    [email or web address]  . Iron and TIBC    Standing Status:   Future    Standing Expiration Date:   02/21/2020  . Ferritin    Standing Status:   Future    Standing Expiration Date:   02/21/2020    INTERVAL HISTORY: Please see below for problem oriented charting. She returns for cycle 6 of chemotherapy Since last time I saw her, she is doing very well She continues to have great energy and working with exercise at home.  She has not seen physical therapy.  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 SEE COMMENT.  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 The greatest extent of invasive carcinoma as measured on the needle core biopsy measures 0.6 cm.       Endometrial cancer ([place])   02/03/2002 Pathology Results    1. BENIGN ENDOMETRIAL POLYPS.  2. UTERINE FIBROIDS: PORTIONS OF SMOOTH MUSCLE CONSISTENT WITH BENIGN LEIOMYOMA(S). PORTIONS OF BENIGN, CYSTICALLY ATROPHIC ENDOMETRIUM WITHOUT HYPERPLASIA OR EVIDENCE OF MALIGNANCY. 3. ENDOMETRIAL CURETTAGE: BENIGN ENDOMETRIUM AND SMOOTH MUSCLE.    09/07/2016 Pathology Results  PAP smear positive for malignant cells    09/07/2016 Initial Diagnosis    Patient had postmenopausal bleeding in 08-2016. She had PAP 09-07-16 by PCP Dr [name], which documented carcinoma. She had evaluation by Dr [name] with colposcopy/ ECC/endometrial biopsy documenting serous  endometrioid endometrial carcinoma.    09/15/2016 Imaging    Ct imaging showed markedly thickened and heterogeneous endometrial stripe suspicious for endometrial carcinoma in this patient with postmenopausal vaginal bleeding. Cystic lesion in the right ovary cannot be definitively characterized. Pelvic ultrasound is recommended for further evaluation. Aortoiliac atherosclerosis. Avascular necrosis of the femoral heads bilaterally.    10/07/2016 Tumor Marker    Patient's tumor was tested for the following markers: CA125 Results of the tumor marker test revealed 15.1    10/13/2016 Pathology Results    1. Lymph node, sentinel, biopsy, right obturator - THERE IS NO EVIDENCE OF CARCINOMA IN 8 OF 8 LYMPH NODES (0/8). - SEE COMMENT. 2. Lymph node, sentinel, biopsy, left obturator - THERE IS NO EVIDENCE OF CARCINOMA IN 1 OF 1 LYMPH NODE (0/1). - SEE COMMENT. 3. Lymph node, sentinel, biopsy, left para-aortic - THERE IS NO EVIDENCE OF CARCINOMA IN 4 OF 4 LYMPH NODES (0/4). - SEE COMMENT. 4. Uterus +/- tubes/ovaries, neoplastic, cervix - INVASIVE MIXED SEROUS/ENDOMETRIOID ADENOCARCINOMA, MULTIPLE FOCI, FIGO GRADE III, THE LARGEST FOCUS SPANS 8.2 CM. - ADENOCARCINOMA EXTENDS INTO THE OUTER HALF OF THE MYOMETRIUM AND INVOLVES THE STROMA OF THE LOWER UTERINE SEGMENT AND CERVIX. - LYMPHOVASCULAR INVASION IS IDENTIFIED. - THE SURGICAL RESECTION MARGINS ARE NEGATIVE FOR CARCINOMA. - SEE ONCOLOGY TABLE BELOW. ADDITIONAL FINDINGS: - ENDOMETRIUM: ENDOMETRIOID TYPE POLYP(S), WHICH CONTAIN FOCI OF SIMILAR APPEARING MIXED SEROUS/ENDOMETRIOID ADENOCARCINOMA. - MYOMETRIUM: LEIOMYOMATA. - SEROSA: UNREMARKABLE. - BILATERAL ADNEXA: BENIGN OVARIES AND FALLOPIAN TUBES.    10/13/2016 Surgery    Surgery: Total robotic hysterectomy bilateral salpingo-oophorectomy, bilateral pelvic sentinel lymph node removal, left PA sentinel lymph node removal. Mini-laparotomy for specimen removal Surgeons:  [name],  MD; [name], MD  Pathology:  1)Uterus, cervix, bilateral tubes and ovaries 2) Right obturator SLN 3) Left Obturator SLN 4) Left PA SLN Operative findings: 14 week fibroid uterus with one large dominant 6 cm myoma that was palpably calcified. 4 cm right ovarian cyst. + SLN identified in the bilateral obturator spaces, + SLN identified in the left PA space.     12/01/2016 - 05/31/2017 Chemotherapy    She received carboplatin & Taxol. She had 3 cycles of chemo followed by radiation and then 3 more cycles of chemo    01/11/2017 Genetic Testing    Testing was normal and did not reveal a mutation in these genes: The genes tested were the 80 genes on [employer]'s Multi-Cancer panel (ALK, APC, ATM, AXIN2, BAP1, BARD1, BLM, BMPR1A, BRCA1, BRCA2, BRIP1, CASR, CDC73, CDH1, CDK4, CDKN1B, CDKN1C, CDKN2A, CEBPA, CHEK2, DICER1, DIS3L2, EGFR, EPCAM, FH, FLCN, GATA2, GPC3, GREM1, HOXB13,  HRAS, KIT, MAX, MEN1, MET, MITF, MLH1, MSH2, MSH6, MUTYH, NBN, NF1, NF2, PALB2, PDGFRA, PHOX2B, PMS2, POLD1, POLE, POT1, PRKAR1A, PTCH1, PTEN, RAD50, RAD51C, RAD51D, RB1, RECQL4, RET, RUNX1, SDHA, SDHAF2, SDHB, SDHC, SDHD, SMAD4, SMARCA4, SMARCB1, SMARCE1, STK11, SUFU, TERC, TERT, TMEM127, TP53, TSC1, TSC2, VHL, WRN, and WT1).    02/10/2017 - 04/12/2017 Radiation Therapy    02/10/17-03/16/17; 03/29/17-04/12/17;  1) Pelvis/ 45 Gy in 25 fractions  2) Vaginal Cuff/ 18 Gy in 3 fractions    08/26/2018 Imaging    Large destructive mass lesion left iliac bone with large soft tissue mass extending into the posterior  soft tissues and pelvis compatible with metastatic disease. Consider follow-up CT chest abdomen pelvis with contrast for further staging.  Radiation changes at L5 and the sacrum. No lumbar metastatic deposits  Multilevel degenerative changes above.    08/29/2018 Imaging    1. 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 2. New irregular nodular opacity in the medial right  middle lobe, new since 2017 CT. Separate subcentimeter right upper lobe solid pulmonary nodule, for which no comparison exists. These findings are indeterminate for pulmonary metastases and attention on follow-up chest CT in 3 months is recommended. 3. No lymphadenopathy or additional findings of metastatic disease.No tumor recurrence at the hysterectomy margin. 4. Aortic Atherosclerosis (ICD10-I70.0). Additional chronic findings as detailed.    08/29/2018 Tumor Marker    Patient's tumor was tested for the following markers: CA-125 Results of the tumor marker test revealed 17.4    09/06/2018 Procedure    CT-guided core biopsy performed of huge mass in the left iliac fossa destroying the left iliac bone.    09/06/2018 Pathology Results    Soft Tissue Needle Core Biopsy, Left Iliac Fossa - POORLY DIFFERENTIATED MALIGNANT NEOPLASM. Microscopic Comment The provided clinical history of both endometrial and breast cancer is noted. In the current case, immunohistochemistry for qualitative ER demonstrates scattered positive staining. CK8/18 is focally positive. CKAE1/3, CK7, CK20, TTF-1, CDX-2, GATA3 and PAX 8 are negative. The immunophenotype is not specific as to an origin for this poorly differentiated malignant neoplasm. The morphology is more suggestive of endometrial cancer than breast cancer, and may represent an unrecognized carcinosarcomatous component.    09/08/2018 - 09/27/2018 Radiation Therapy    She had palliative radiation treatment    09/29/2018 Procedure    Placement of single lumen port a cath via right internal jugular vein. The catheter tip lies at the cavo-atrial junction. A power injectable port a cath was placed and is ready for immediate use.    10/04/2018 -  Chemotherapy    The patient had carboplatin and taxol    12/05/2018 Tumor Marker    Patient's tumor was tested for the following markers: CA125 Results of the tumor marker test revealed 5.5    12/05/2018 Imaging    1.  Considerable cystic degeneration in the large destructive mass of the left iliac bone. New or progressive left iliac bone pathologic fracture extending into the left SI joint, along with abnormal subluxation at the pubic symphysis indicating pubic symphysis instability. 2. Previous pleural-based nodularity in the right middle lobe is markedly improved, currently 4 mm in thickness and previously 11 mm in thickness. 3. Indistinct new wedge-shaped lesion in the left mid kidney, hypodense on portal venous phase images and hyperdense on delayed phase images, suggesting prolong tubular transit. This is a nonspecific finding but could reflect local inflammation, infection, or less likely infiltrative tumor. 4. Other imaging findings of potential clinical significance: Aortic Atherosclerosis (ICD10-I70.0). Left main coronary artery atherosclerosis. 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     Metastasis to bone ([place])   08/30/2018 Initial Diagnosis    Metastasis to bone ([place])    10/04/2018 -  Chemotherapy    The patient had palonosetron (ALOXI) injection 0.25 mg, 0.25 mg, Intravenous,  Once, 6 of 6 cycles Administration: 0.25 mg (10/04/2018), 0.25 mg (10/25/2018), 0.25 mg (11/15/2018), 0.25 mg (12/06/2018), 0.25 mg (12/27/2018) CARBOplatin (PARAPLATIN) 360 mg in sodium chloride 0.9 %  250 mL chemo infusion, 360 mg (100 % of original dose 364.5 mg), Intravenous,  Once, 6 of 6 cycles Dose modification:   (original dose 364.5 mg, Cycle 1) Administration: 360 mg (10/04/2018), 360 mg (10/25/2018), 360 mg (11/15/2018), 360 mg (12/06/2018), 360 mg (12/27/2018) PACLitaxel (TAXOL) 222 mg in sodium chloride 0.9 % 250 mL chemo infusion (> '80mg'$ /m2), 131.25 mg/m2 = 222 mg (75 % of original dose 175 mg/m2), Intravenous,  Once, 6 of 6 cycles Dose modification: 131.25 mg/m2 (75 % of original dose 175 mg/m2, Cycle 1, Reason:  Dose Not Tolerated) Administration: 222 mg (10/04/2018), 222 mg (10/25/2018), 222 mg (11/15/2018), 222 mg (12/06/2018), 222 mg (12/27/2018) fosaprepitant (EMEND) 150 mg, dexamethasone (DECADRON) 12 mg in sodium chloride 0.9 % 145 mL IVPB, , Intravenous,  Once, 6 of 6 cycles Administration:  (10/04/2018),  (10/25/2018),  (11/15/2018),  (12/06/2018),  (12/27/2018)  for chemotherapy treatment.      REVIEW OF SYSTEMS:   Constitutional: Denies fevers, chills or abnormal weight loss Eyes: Denies blurriness of vision Ears, nose, mouth, throat, and face: Denies mucositis or sore throat Respiratory: Denies cough, dyspnea or wheezes Cardiovascular: Denies palpitation, chest discomfort or lower extremity swelling Gastrointestinal:  Denies nausea, heartburn or change in bowel habits Skin: Denies abnormal skin rashes Lymphatics: Denies new lymphadenopathy or easy bruising Neurological:Denies numbness, tingling or new weaknesses Behavioral/Psych: Mood is stable, no new changes  All other systems were reviewed with the patient and are negative.  I have reviewed the past medical history, past surgical history, social history and family history with the patient and they are unchanged from previous note.  ALLERGIES:  is allergic to omnipaque [iohexol]; calan [verapamil]; effexor [venlafaxine]; erythromycin; femara [letrozole]; fosamax [alendronate]; ibuprofen; paxil [paroxetine hcl]; remeron [mirtazapine]; tamoxifen; and vasotec [enalapril].  MEDICATIONS:  Current Outpatient Medications  Medication Sig Dispense Refill  . acetaminophen (TYLENOL) 500 MG tablet Take 500 mg by mouth daily as needed for moderate pain or headache.    Marland Kitchen aspirin 81 MG tablet Take 81 mg by mouth daily.      . Calcium Carbonate-Vitamin D (CALCIUM 600 + D PO) Take 1 tablet by mouth 2 (two) times daily.     . Cholecalciferol (VITAMIN D) 2000 units CAPS Take 2,000 Units by mouth every other day.    Marland Kitchen dexamethasone (DECADRON) 4 MG tablet  Take 3 tabs at the night before and 3 tab the morning of chemotherapy, every 3 weeks, by mouth 6 tablet 0  . furosemide (LASIX) 20 MG tablet Once daily for 3 days, repeat as needed for lower extremity edema 30 tablet 1  . levothyroxine (SYNTHROID, LEVOTHROID) 88 MCG tablet Take 1 tablet (88 mcg total) by mouth daily before breakfast. 90 tablet 1  . lidocaine-prilocaine (EMLA) cream Apply to affected area once 30 g 3  . loratadine (CLARITIN) 10 MG tablet Take 10 mg by mouth daily as needed for allergies.    Marland Kitchen loratadine-pseudoephedrine (CLARITIN-D 24 HOUR) 10-240 MG 24 hr tablet Take 1 tablet by mouth daily as needed for allergies.    . metFORMIN (GLUCOPHAGE-XR) 500 MG 24 hr tablet Take 1 tablet (500 mg total) by mouth 2 (two) times daily. 360 tablet 1  . Multiple Vitamin (MULTIVITAMIN WITH MINERALS) TABS tablet Take 1 tablet by mouth daily.    . ondansetron (ZOFRAN) 8 MG tablet Take 1 tablet (8 mg total) by mouth every 8 (eight) hours as needed for refractory nausea / vomiting. Start on day 3 after chemo. 30 tablet 1  . pravastatin (PRAVACHOL)  40 MG tablet Take 1 tablet (40 mg total) by mouth every evening. 90 tablet 1  . prochlorperazine (COMPAZINE) 10 MG tablet Take 1 tablet (10 mg total) by mouth every 6 (six) hours as needed (Nausea or vomiting). 30 tablet 1  . traMADol (ULTRAM) 50 MG tablet Take 1 tablet (50 mg total) by mouth 3 (three) times daily. Take 1/2 to 1 tablet every 4 hours as needed for severe Back Pain 90 tablet 0  . triamterene-hydrochlorothiazide (MAXZIDE-25) 37.5-25 MG tablet Take 1 tablet by mouth daily. Takes in the am     No current facility-administered medications for this visit.    Facility-Administered Medications Ordered in Other Visits  Medication Dose Route Frequency Provider Last Rate Last Dose  . CARBOplatin (PARAPLATIN) 360 mg in sodium chloride 0.9 % 100 mL chemo infusion  360 mg Intravenous Once Alvy Bimler, Jem Castro, MD      . diphenhydrAMINE (BENADRYL) 25 mg in sodium  chloride 0.9 % 50 mL IVPB  25 mg Intravenous Once Alvy Bimler, Kristol Almanzar, MD      . famotidine (PEPCID) IVPB 20 mg premix  20 mg Intravenous Once Alvy Bimler, Lorren Rossetti, MD      . fosaprepitant (EMEND) 150 mg, dexamethasone (DECADRON) 12 mg in sodium chloride 0.9 % 145 mL IVPB   Intravenous Once Alvy Bimler, Ayaana Biondo, MD      . heparin lock flush 100 unit/mL  500 Units Intracatheter Once PRN Alvy Bimler, Shatona Andujar, MD      . heparin lock flush 100 unit/mL  250 Units Intracatheter Once PRN Alvy Bimler, Cayden Granholm, MD      . PACLitaxel (TAXOL) 222 mg in sodium chloride 0.9 % 250 mL chemo infusion (> '80mg'$ /m2)  131.25 mg/m2 (Treatment Plan Recorded) Intravenous Once Alvy Bimler, Lot Medford, MD      . palonosetron (ALOXI) injection 0.25 mg  0.25 mg Intravenous Once Elaine Roanhorse, MD      . sodium chloride flush (NS) 0.9 % injection 10 mL  10 mL Intracatheter PRN Alvy Bimler, Tinesha Siegrist, MD   10 mL at 12/27/18 1825  . sodium chloride flush (NS) 0.9 % injection 10 mL  10 mL Intracatheter PRN Alvy Bimler, Havah Ammon, MD        PHYSICAL EXAMINATION: ECOG PERFORMANCE STATUS: 1 - Symptomatic but completely ambulatory  Vitals:   01/17/19 1116  BP: 135/74  Pulse: 97  Resp: 18  Temp: 97.7 F (36.5 C)  SpO2: 99%   Filed Weights   01/17/19 1116  Weight: 145 lb 12.8 oz (66.1 kg)    GENERAL:alert, no distress and comfortable SKIN: skin color, texture, turgor are normal, no rashes or significant lesions EYES: normal, Conjunctiva are pink and non-injected, sclera clear OROPHARYNX:no exudate, no erythema and lips, buccal mucosa, and tongue normal  NECK: supple, thyroid normal size, non-tender, without nodularity LYMPH:  no palpable lymphadenopathy in the cervical, axillary or inguinal LUNGS: clear to auscultation and percussion with normal breathing effort HEART: regular rate & rhythm and no murmurs and no lower extremity edema ABDOMEN:abdomen soft, non-tender and normal bowel sounds Musculoskeletal:no cyanosis of digits and no clubbing  NEURO: alert & oriented x 3 with fluent speech, no  focal motor/sensory deficits  LABORATORY DATA:  I have reviewed the data as listed    Component Value Date/Time   NA 137 01/17/2019 1045   NA 137 05/31/2017 0931   K 3.6 01/17/2019 1045   K 3.5 05/31/2017 0931   CL 95 (L) 01/17/2019 1045   CL 99 12/20/2012 1336   CO2 29 01/17/2019 1045   CO2 25 05/31/2017  0931   GLUCOSE 117 (H) 01/17/2019 1045   GLUCOSE 161 (H) 05/31/2017 0931   GLUCOSE 110 (H) 12/20/2012 1336   BUN 13 01/17/2019 1045   BUN 10.9 05/31/2017 0931   CREATININE 0.78 01/17/2019 1045   CREATININE 0.63 07/05/2018 1009   CREATININE 0.8 05/31/2017 0931   CALCIUM 9.8 01/17/2019 1045   CALCIUM 10.6 (H) 05/31/2017 0931   PROT 7.4 01/17/2019 1045   PROT 7.5 05/31/2017 0931   ALBUMIN 3.8 01/17/2019 1045   ALBUMIN 4.2 05/31/2017 0931   AST 12 (L) 01/17/2019 1045   AST 16 05/31/2017 0931   ALT 8 01/17/2019 1045   ALT 15 05/31/2017 0931   ALKPHOS 70 01/17/2019 1045   ALKPHOS 91 05/31/2017 0931   BILITOT 0.3 01/17/2019 1045   BILITOT 0.33 05/31/2017 0931   GFRNONAA >60 01/17/2019 1045   GFRNONAA 87 07/05/2018 1009   GFRAA >60 01/17/2019 1045   GFRAA 100 07/05/2018 1009    No results found for: SPEP, UPEP  Lab Results  Component Value Date   WBC 6.1 01/17/2019   NEUTROABS 5.6 01/17/2019   HGB 10.4 (L) 01/17/2019   HCT 32.9 (L) 01/17/2019   MCV 77.8 (L) 01/17/2019   PLT 349 01/17/2019      Chemistry      Component Value Date/Time   NA 137 01/17/2019 1045   NA 137 05/31/2017 0931   K 3.6 01/17/2019 1045   K 3.5 05/31/2017 0931   CL 95 (L) 01/17/2019 1045   CL 99 12/20/2012 1336   CO2 29 01/17/2019 1045   CO2 25 05/31/2017 0931   BUN 13 01/17/2019 1045   BUN 10.9 05/31/2017 0931   CREATININE 0.78 01/17/2019 1045   CREATININE 0.63 07/05/2018 1009   CREATININE 0.8 05/31/2017 0931      Component Value Date/Time   CALCIUM 9.8 01/17/2019 1045   CALCIUM 10.6 (H) 05/31/2017 0931   ALKPHOS 70 01/17/2019 1045   ALKPHOS 91 05/31/2017 0931   AST 12 (L)  01/17/2019 1045   AST 16 05/31/2017 0931   ALT 8 01/17/2019 1045   ALT 15 05/31/2017 0931   BILITOT 0.3 01/17/2019 1045   BILITOT 0.33 05/31/2017 0931      All questions were answered. The patient knows to call the clinic with any problems, questions or concerns. No barriers to learning was detected.  I spent 15 minutes counseling the patient face to face. The total time spent in the appointment was 20 minutes and more than 50% was on counseling and review of test results  [name], MD 01/17/2019 11:53 AM

## 2019-01-17 NOTE — Assessment & Plan Note (Signed)
She has minimum pain, well controlled with over-the-counter Tylenol only She will continue the same

## 2019-01-17 NOTE — Telephone Encounter (Signed)
Gave avs and calendar ° °

## 2019-02-08 ENCOUNTER — Telehealth: Payer: Self-pay

## 2019-02-08 NOTE — Telephone Encounter (Signed)
She called and left a message to call her.  Called back.  She has a scan ordered for 3/25. She is concerned about the virus. She wants to keep her appt's as scheduled. Unless Dr. Alvy Bimler thinks they should be rescheduled. Told her I would call her back tomorrow. She verbalized understanding.

## 2019-02-09 NOTE — Telephone Encounter (Signed)
Keep all appt as scheduled for now

## 2019-02-09 NOTE — Telephone Encounter (Signed)
Called and given below message. She verbalized understanding. 

## 2019-02-14 ENCOUNTER — Other Ambulatory Visit: Payer: Self-pay

## 2019-02-14 ENCOUNTER — Telehealth: Payer: Self-pay

## 2019-02-14 MED ORDER — PREDNISONE 50 MG PO TABS: ORAL_TABLET | ORAL | 0 refills | Status: DC

## 2019-02-14 NOTE — Telephone Encounter (Signed)
Spoke with pt by phone.  She confirms that she broke out in a rash the last time she had CT scan. Prescription for Prednisone 50mg  sent to her pharmacy.   Instructed her to take 1 50mg  tablet at 13 hrs, 7 hrs and 1hr prior to CT scan on 3/25.  Also take 50mg  Benadryl at 1hr prior to CT scan.  Pt verbalizes understanding of instructions.  Pt concerned about how she will physically get to Radiology d/t her need for wheelchair and visitor restrictions.  Confirmed with Tedra Coupe in Radiology that a visitor may bring pt to department and then will need to leave and they will call visitor when procedure complete. Pt verbalizes understanding.

## 2019-02-15 ENCOUNTER — Other Ambulatory Visit: Payer: Medicare Other

## 2019-02-15 ENCOUNTER — Inpatient Hospital Stay: Payer: Medicare Other | Attending: Hematology and Oncology

## 2019-02-15 ENCOUNTER — Inpatient Hospital Stay: Payer: Medicare Other

## 2019-02-15 ENCOUNTER — Other Ambulatory Visit: Payer: Self-pay

## 2019-02-15 ENCOUNTER — Ambulatory Visit (HOSPITAL_COMMUNITY)
Admission: RE | Admit: 2019-02-15 | Discharge: 2019-02-15 | Disposition: A | Discharge status: Home / Self Care | Payer: Medicare Other | Source: Ambulatory Visit | Attending: Hematology and Oncology | Admitting: Hematology and Oncology

## 2019-02-15 DIAGNOSIS — D509 Iron deficiency anemia, unspecified: Secondary | ICD-10-CM | POA: Diagnosis not present

## 2019-02-15 DIAGNOSIS — C787 Secondary malignant neoplasm of liver and intrahepatic bile duct: Secondary | ICD-10-CM | POA: Diagnosis not present

## 2019-02-15 DIAGNOSIS — C7800 Secondary malignant neoplasm of unspecified lung: Secondary | ICD-10-CM | POA: Diagnosis not present

## 2019-02-15 DIAGNOSIS — C7951 Secondary malignant neoplasm of bone: Secondary | ICD-10-CM

## 2019-02-15 DIAGNOSIS — C541 Malignant neoplasm of endometrium: Secondary | ICD-10-CM | POA: Insufficient documentation

## 2019-02-15 DIAGNOSIS — T451X5A Adverse effect of antineoplastic and immunosuppressive drugs, initial encounter: Secondary | ICD-10-CM

## 2019-02-15 DIAGNOSIS — K869 Disease of pancreas, unspecified: Secondary | ICD-10-CM | POA: Diagnosis not present

## 2019-02-15 DIAGNOSIS — G893 Neoplasm related pain (acute) (chronic): Secondary | ICD-10-CM | POA: Diagnosis not present

## 2019-02-15 DIAGNOSIS — D6481 Anemia due to antineoplastic chemotherapy: Secondary | ICD-10-CM

## 2019-02-15 DIAGNOSIS — C7801 Secondary malignant neoplasm of right lung: Secondary | ICD-10-CM

## 2019-02-15 LAB — CBC WITH DIFFERENTIAL (CANCER CENTER ONLY)
Abs Immature Granulocytes: 0.02 10*3/uL (ref 0.00–0.07)
BASOS PCT: 0 %
Basophils Absolute: 0 10*3/uL (ref 0.0–0.1)
Eosinophils Absolute: 0 10*3/uL (ref 0.0–0.5)
Eosinophils Relative: 0 %
HCT: 33.3 % — ABNORMAL LOW (ref 36.0–46.0)
Hemoglobin: 10.5 g/dL — ABNORMAL LOW (ref 12.0–15.0)
Immature Granulocytes: 0 %
Lymphocytes Relative: 7 %
Lymphs Abs: 0.3 10*3/uL — ABNORMAL LOW (ref 0.7–4.0)
MCH: 24.8 pg — AB (ref 26.0–34.0)
MCHC: 31.5 g/dL (ref 30.0–36.0)
MCV: 78.7 fL — ABNORMAL LOW (ref 80.0–100.0)
MONO ABS: 0.1 10*3/uL (ref 0.1–1.0)
MONOS PCT: 2 %
Neutro Abs: 4.2 10*3/uL (ref 1.7–7.7)
Neutrophils Relative %: 91 %
Platelet Count: 320 10*3/uL (ref 150–400)
RBC: 4.23 MIL/uL (ref 3.87–5.11)
RDW: 18.3 % — ABNORMAL HIGH (ref 11.5–15.5)
WBC Count: 4.6 10*3/uL (ref 4.0–10.5)
nRBC: 0 % (ref 0.0–0.2)

## 2019-02-15 LAB — FERRITIN: Ferritin: 115 ng/mL (ref 11–307)

## 2019-02-15 LAB — CMP (CANCER CENTER ONLY)
ALT: 8 U/L (ref 0–44)
AST: 13 U/L — ABNORMAL LOW (ref 15–41)
Albumin: 3.8 g/dL (ref 3.5–5.0)
Alkaline Phosphatase: 72 U/L (ref 38–126)
Anion gap: 13 (ref 5–15)
BUN: 12 mg/dL (ref 8–23)
CO2: 29 mmol/L (ref 22–32)
Calcium: 9.9 mg/dL (ref 8.9–10.3)
Chloride: 96 mmol/L — ABNORMAL LOW (ref 98–111)
Creatinine: 0.75 mg/dL (ref 0.44–1.00)
GFR, Est AFR Am: >60 mL/min (ref >60)
GFR, Estimated: >60 mL/min (ref >60)
Glucose, Bld: 145 mg/dL — ABNORMAL HIGH (ref 70–99)
Potassium: 3.5 mmol/L (ref 3.5–5.1)
Sodium: 138 mmol/L (ref 135–145)
TOTAL PROTEIN: 7.4 g/dL (ref 6.5–8.1)
Total Bilirubin: 0.4 mg/dL (ref 0.3–1.2)

## 2019-02-15 LAB — IRON AND TIBC
Iron: 39 ug/dL — ABNORMAL LOW (ref 41–142)
Saturation Ratios: 13 % — ABNORMAL LOW (ref 21–57)
TIBC: 303 ug/dL (ref 236–444)
UIBC: 264 ug/dL (ref 120–384)

## 2019-02-15 MED ORDER — HEPARIN SOD (PORK) LOCK FLUSH 100 UNIT/ML IV SOLN
500.0000 [IU] | Freq: Once | INTRAVENOUS | Status: AC
  Administered 2019-02-15: 500 [IU] via INTRAVENOUS

## 2019-02-15 MED ORDER — HEPARIN SOD (PORK) LOCK FLUSH 100 UNIT/ML IV SOLN
INTRAVENOUS | Status: AC
  Filled 2019-02-15: qty 5

## 2019-02-15 MED ORDER — SODIUM CHLORIDE 0.9% FLUSH
10.0000 mL | Freq: Once | INTRAVENOUS | Status: AC
  Administered 2019-02-15: 10 mL
  Filled 2019-02-15: qty 10

## 2019-02-15 MED ORDER — SODIUM CHLORIDE (PF) 0.9 % IJ SOLN
INTRAMUSCULAR | Status: AC
  Filled 2019-02-15: qty 50

## 2019-02-15 MED ORDER — IOHEXOL 300 MG/ML  SOLN
100.0000 mL | Freq: Once | INTRAMUSCULAR | Status: AC | PRN
  Administered 2019-02-15: 100 mL via INTRAVENOUS

## 2019-02-16 ENCOUNTER — Encounter: Payer: Self-pay | Admitting: Hematology and Oncology

## 2019-02-16 ENCOUNTER — Other Ambulatory Visit: Payer: Self-pay

## 2019-02-16 ENCOUNTER — Inpatient Hospital Stay (HOSPITAL_BASED_OUTPATIENT_CLINIC_OR_DEPARTMENT_OTHER): Payer: Medicare Other | Admitting: Hematology and Oncology

## 2019-02-16 DIAGNOSIS — K869 Disease of pancreas, unspecified: Secondary | ICD-10-CM

## 2019-02-16 DIAGNOSIS — C787 Secondary malignant neoplasm of liver and intrahepatic bile duct: Secondary | ICD-10-CM | POA: Diagnosis not present

## 2019-02-16 DIAGNOSIS — C541 Malignant neoplasm of endometrium: Secondary | ICD-10-CM

## 2019-02-16 DIAGNOSIS — D509 Iron deficiency anemia, unspecified: Secondary | ICD-10-CM

## 2019-02-16 DIAGNOSIS — C7800 Secondary malignant neoplasm of unspecified lung: Secondary | ICD-10-CM | POA: Diagnosis not present

## 2019-02-16 DIAGNOSIS — C7801 Secondary malignant neoplasm of right lung: Secondary | ICD-10-CM

## 2019-02-16 DIAGNOSIS — Z7189 Other specified counseling: Secondary | ICD-10-CM

## 2019-02-16 DIAGNOSIS — G893 Neoplasm related pain (acute) (chronic): Secondary | ICD-10-CM

## 2019-02-16 LAB — CA 125: Cancer Antigen (CA) 125: 4.1 U/mL (ref 0.0–38.1)

## 2019-02-16 NOTE — Assessment & Plan Note (Signed)
There is a new liver lesion She is not symptomatic Observe only with serial imaging study Her most recent liver enzymes are within normal limits

## 2019-02-16 NOTE — Assessment & Plan Note (Signed)
She has mild iron deficiency anemia but not symptomatic We discussed iron rich diet

## 2019-02-16 NOTE — Assessment & Plan Note (Signed)
I have reviewed her imaging study extensively We took pictures of comparative CT scans so that she can show her family She has new liver lesion that looks cystic, could be due to treated liver metastasis that did not show up on prior imaging I recommend her to go on treatment with either bevacizumab or antiestrogen therapy The risk, benefits, side effects of bevacizumab and antiestrogen therapy were discussed with the patient and family members I will call her next week for her final decision

## 2019-02-16 NOTE — Assessment & Plan Note (Signed)
We discussed the role of continuing treatment as a form of maintenance therapy.

## 2019-02-16 NOTE — Progress Notes (Signed)
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[facility]) I have reviewed her imaging study extensively We took pictures of comparative CT scans so that she can show her family She has new liver lesion that looks cystic, could be due to treated liver metastasis that did not show up on prior imaging I recommend her to go on treatment with either bevacizumab or antiestrogen therapy The risk, benefits, side effects of bevacizumab and antiestrogen therapy were discussed with the patient and family members I will call her next week for her final decision  Iron deficiency anemia She has mild iron deficiency anemia but not symptomatic We discussed iron rich diet  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 continues to have bone pain She will continue calcium with vitamin D and tramadol Her recent imaging studies show regression of the size of the iliac bone lesion.  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   No orders of the defined types were placed in this encounter.   INTERVAL HISTORY: Please see below for problem oriented charting. She returns for follow-up and review of imaging studies She continues  to have intermittent bone pain Her appetite is fair She denies recent fall No recent infection, fever or chills Her family is not present.  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 SEE COMMENT.  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 The greatest extent of invasive carcinoma as measured on the needle core biopsy measures 0.6 cm.       Endometrial cancer ([place])   02/03/2002 Pathology Results    1. BENIGN ENDOMETRIAL POLYPS.  2. UTERINE FIBROIDS: PORTIONS OF SMOOTH MUSCLE CONSISTENT WITH BENIGN LEIOMYOMA(S). PORTIONS OF BENIGN, CYSTICALLY ATROPHIC ENDOMETRIUM WITHOUT HYPERPLASIA OR EVIDENCE OF MALIGNANCY. 3. ENDOMETRIAL CURETTAGE: BENIGN ENDOMETRIUM AND SMOOTH MUSCLE.    09/07/2016 Pathology Results    PAP smear positive for malignant cells    09/07/2016 Initial Diagnosis    Patient had postmenopausal bleeding in 08-2016. She had PAP 09-07-16 by PCP Dr [name], which documented carcinoma. She had evaluation by Dr [name] with colposcopy/ ECC/endometrial biopsy documenting serous endometrioid endometrial carcinoma.    09/15/2016 Imaging    Ct imaging showed markedly thickened and heterogeneous endometrial stripe suspicious for endometrial carcinoma in this patient with  postmenopausal vaginal bleeding. Cystic lesion in the right ovary cannot be definitively characterized. Pelvic ultrasound is recommended for further evaluation. Aortoiliac  atherosclerosis. Avascular necrosis of the femoral heads bilaterally.    10/07/2016 Tumor Marker    Patient's tumor was tested for the following markers: CA125 Results of the tumor marker test revealed 15.1    10/13/2016 Pathology Results    1. Lymph node, sentinel, biopsy, right obturator - THERE IS NO EVIDENCE OF CARCINOMA IN 8 OF 8 LYMPH NODES (0/8). - SEE COMMENT. 2. Lymph node, sentinel, biopsy, left obturator - THERE IS NO EVIDENCE OF CARCINOMA IN 1 OF 1 LYMPH NODE (0/1). - SEE COMMENT. 3. Lymph node, sentinel, biopsy, left para-aortic - THERE IS NO EVIDENCE OF CARCINOMA IN 4 OF 4 LYMPH NODES (0/4). - SEE COMMENT. 4. Uterus +/- tubes/ovaries, neoplastic, cervix - INVASIVE MIXED SEROUS/ENDOMETRIOID ADENOCARCINOMA, MULTIPLE FOCI, FIGO GRADE III, THE LARGEST FOCUS SPANS 8.2 CM. - ADENOCARCINOMA EXTENDS INTO THE OUTER HALF OF THE MYOMETRIUM AND INVOLVES THE STROMA OF THE LOWER UTERINE SEGMENT AND CERVIX. - LYMPHOVASCULAR INVASION IS IDENTIFIED. - THE SURGICAL RESECTION MARGINS ARE NEGATIVE FOR CARCINOMA. - SEE ONCOLOGY TABLE BELOW. ADDITIONAL FINDINGS: - ENDOMETRIUM: ENDOMETRIOID TYPE POLYP(S), WHICH CONTAIN FOCI OF SIMILAR APPEARING MIXED SEROUS/ENDOMETRIOID ADENOCARCINOMA. - MYOMETRIUM: LEIOMYOMATA. - SEROSA: UNREMARKABLE. - BILATERAL ADNEXA: BENIGN OVARIES AND FALLOPIAN TUBES.    10/13/2016 Surgery    Surgery: Total robotic hysterectomy bilateral salpingo-oophorectomy, bilateral pelvic sentinel lymph node removal, left PA sentinel lymph node removal. Mini-laparotomy for specimen removal Surgeons:  [name], MD; [name], MD  Pathology:  1)Uterus, cervix, bilateral tubes and ovaries 2) Right obturator SLN 3) Left Obturator SLN 4) Left PA SLN Operative findings: 14 week fibroid uterus with one large dominant 6 cm myoma that was palpably calcified. 4 cm right ovarian cyst. + SLN identified in the bilateral obturator spaces, + SLN identified in the left PA  space.     12/01/2016 - 05/31/2017 Chemotherapy    She received carboplatin & Taxol. She had 3 cycles of chemo followed by radiation and then 3 more cycles of chemo    01/11/2017 Genetic Testing    Testing was normal and did not reveal a mutation in these genes: The genes tested were the 80 genes on [employer]'s Multi-Cancer panel (ALK, APC, ATM, AXIN2, BAP1, BARD1, BLM, BMPR1A, BRCA1, BRCA2, BRIP1, CASR, CDC73, CDH1, CDK4, CDKN1B, CDKN1C, CDKN2A, CEBPA, CHEK2, DICER1, DIS3L2, EGFR, EPCAM, FH, FLCN, GATA2, GPC3, GREM1, HOXB13,  HRAS, KIT, MAX, MEN1, MET, MITF, MLH1, MSH2, MSH6, MUTYH, NBN, NF1, NF2, PALB2, PDGFRA, PHOX2B, PMS2, POLD1, POLE, POT1, PRKAR1A, PTCH1, PTEN, RAD50, RAD51C, RAD51D, RB1, RECQL4, RET, RUNX1, SDHA, SDHAF2, SDHB, SDHC, SDHD, SMAD4, SMARCA4, SMARCB1, SMARCE1, STK11, SUFU, TERC, TERT, TMEM127, TP53, TSC1, TSC2, VHL, WRN, and WT1).    02/10/2017 - 04/12/2017 Radiation Therapy    02/10/17-03/16/17; 03/29/17-04/12/17;  1) Pelvis/ 45 Gy in 25 fractions  2) Vaginal Cuff/ 18 Gy in 3 fractions    08/26/2018 Imaging    Large destructive mass lesion left iliac bone with large soft tissue mass extending into the posterior soft tissues and pelvis compatible with metastatic disease. Consider follow-up CT chest abdomen pelvis with contrast for further staging.  Radiation changes at L5 and the sacrum. No lumbar metastatic deposits  Multilevel degenerative changes above.    08/29/2018 Imaging    1. 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 2. New irregular nodular opacity in the medial right middle lobe, new since 2017  CT. Separate subcentimeter right upper lobe solid pulmonary nodule, for which no comparison exists. These findings are indeterminate for pulmonary metastases and attention on follow-up chest CT in 3 months is recommended. 3. No lymphadenopathy or additional findings of metastatic disease.No tumor recurrence at the hysterectomy margin. 4.  Aortic Atherosclerosis (ICD10-I70.0). Additional chronic findings as detailed.    08/29/2018 Tumor Marker    Patient's tumor was tested for the following markers: CA-125 Results of the tumor marker test revealed 17.4    09/06/2018 Procedure    CT-guided core biopsy performed of huge mass in the left iliac fossa destroying the left iliac bone.    09/06/2018 Pathology Results    Soft Tissue Needle Core Biopsy, Left Iliac Fossa - POORLY DIFFERENTIATED MALIGNANT NEOPLASM. Microscopic Comment The provided clinical history of both endometrial and breast cancer is noted. In the current case, immunohistochemistry for qualitative ER demonstrates scattered positive staining. CK8/18 is focally positive. CKAE1/3, CK7, CK20, TTF-1, CDX-2, GATA3 and PAX 8 are negative. The immunophenotype is not specific as to an origin for this poorly differentiated malignant neoplasm. The morphology is more suggestive of endometrial cancer than breast cancer, and may represent an unrecognized carcinosarcomatous component.    09/08/2018 - 09/27/2018 Radiation Therapy    She had palliative radiation treatment    09/29/2018 Procedure    Placement of single lumen port a cath via right internal jugular vein. The catheter tip lies at the cavo-atrial junction. A power injectable port a cath was placed and is ready for immediate use.    10/04/2018 - 01/17/2019 Chemotherapy    The patient had carboplatin and taxol x 6 cycles    12/05/2018 Tumor Marker    Patient's tumor was tested for the following markers: CA125 Results of the tumor marker test revealed 5.5    12/05/2018 Imaging    1. Considerable cystic degeneration in the large destructive mass of the left iliac bone. New or progressive left iliac bone pathologic fracture extending into the left SI joint, along with abnormal subluxation at the pubic symphysis indicating pubic symphysis instability. 2. Previous pleural-based nodularity in the right middle lobe is markedly  improved, currently 4 mm in thickness and previously 11 mm in thickness. 3. Indistinct new wedge-shaped lesion in the left mid kidney, hypodense on portal venous phase images and hyperdense on delayed phase images, suggesting prolong tubular transit. This is a nonspecific finding but could reflect local inflammation, infection, or less likely infiltrative tumor. 4. Other imaging findings of potential clinical significance: Aortic Atherosclerosis (ICD10-I70.0). Left main coronary artery atherosclerosis. 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    02/15/2019 Imaging    1. Slight progression of large expansile lytic lesion involving the left iliac bone. This demonstrates central low density, suggesting partial necrosis. This lesion was biopsied on 09/06/2018, revealing poorly differentiated malignant neoplasm. 2. New low-density lesion in the dome of the right hepatic lobe worrisome for cystic or treated metastasis. 3. No other evidence of metastatic disease. No explanation for the patient's symptoms. 4. 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      Metastasis to bone (HCC)    REVIEW OF SYSTEMS:   Constitutional: Denies fevers, chills or abnormal weight loss Eyes: Denies blurriness of vision Ears, nose, mouth, throat, and face: Denies mucositis or sore throat Respiratory: Denies cough, dyspnea or wheezes Cardiovascular: Denies palpitation, chest discomfort or lower extremity swelling Gastrointestinal:  Denies  nausea, heartburn or change in bowel habits Skin: Denies abnormal skin rashes Lymphatics: Denies new lymphadenopathy or easy bruising Neurological:Denies numbness, tingling or new weaknesses Behavioral/Psych: Mood is stable, no new changes  All other systems were reviewed with the patient and are negative.  I have reviewed the past medical  history, past surgical history, social history and family history with the patient and they are unchanged from previous note.  ALLERGIES:  is allergic to omnipaque [iohexol]; calan [verapamil]; effexor [venlafaxine]; erythromycin; femara [letrozole]; fosamax [alendronate]; ibuprofen; paxil [paroxetine hcl]; remeron [mirtazapine]; tamoxifen; and vasotec [enalapril].  MEDICATIONS:  Current Outpatient Medications  Medication Sig Dispense Refill  . acetaminophen (TYLENOL) 500 MG tablet Take 500 mg by mouth daily as needed for moderate pain or headache.    Marland Kitchen aspirin 81 MG tablet Take 81 mg by mouth daily.      . Calcium Carbonate-Vitamin D (CALCIUM 600 + D PO) Take 1 tablet by mouth 2 (two) times daily.     . Cholecalciferol (VITAMIN D) 2000 units CAPS Take 2,000 Units by mouth every other day.    . furosemide (LASIX) 20 MG tablet Once daily for 3 days, repeat as needed for lower extremity edema 30 tablet 1  . levothyroxine (SYNTHROID, LEVOTHROID) 88 MCG tablet Take 1 tablet (88 mcg total) by mouth daily before breakfast. 90 tablet 1  . lidocaine-prilocaine (EMLA) cream Apply to affected area once 30 g 3  . loratadine (CLARITIN) 10 MG tablet Take 10 mg by mouth daily as needed for allergies.    Marland Kitchen loratadine-pseudoephedrine (CLARITIN-D 24 HOUR) 10-240 MG 24 hr tablet Take 1 tablet by mouth daily as needed for allergies.    . metFORMIN (GLUCOPHAGE-XR) 500 MG 24 hr tablet Take 1 tablet (500 mg total) by mouth 2 (two) times daily. 360 tablet 1  . Multiple Vitamin (MULTIVITAMIN WITH MINERALS) TABS tablet Take 1 tablet by mouth daily.    . ondansetron (ZOFRAN) 8 MG tablet Take 1 tablet (8 mg total) by mouth every 8 (eight) hours as needed for refractory nausea / vomiting. Start on day 3 after chemo. 30 tablet 1  . pravastatin (PRAVACHOL) 40 MG tablet Take 1 tablet (40 mg total) by mouth every evening. 90 tablet 1  . prochlorperazine (COMPAZINE) 10 MG tablet Take 1 tablet (10 mg total) by mouth every 6 (six)  hours as needed (Nausea or vomiting). 30 tablet 1  . traMADol (ULTRAM) 50 MG tablet Take 1 tablet (50 mg total) by mouth 3 (three) times daily. Take 1/2 to 1 tablet every 4 hours as needed for severe Back Pain 90 tablet 0  . triamterene-hydrochlorothiazide (MAXZIDE-25) 37.5-25 MG tablet Take 1 tablet by mouth daily. Takes in the am     No current facility-administered medications for this visit.    Facility-Administered Medications Ordered in Other Visits  Medication Dose Route Frequency Provider Last Rate Last Dose  . sodium chloride flush (NS) 0.9 % injection 10 mL  10 mL Intracatheter PRN Alvy Bimler, Cree Kunert, MD   10 mL at 12/27/18 1825    PHYSICAL EXAMINATION: ECOG PERFORMANCE STATUS: 2 - Symptomatic, <50% confined to bed  Vitals:   02/16/19 1010  BP: (!) 129/59  Pulse: 94  Resp: 18  Temp: 98.1 F (36.7 C)  SpO2: 100%   Filed Weights   02/16/19 1010  Weight: 145 lb 8 oz (66 kg)    GENERAL:alert, no distress and comfortable NEURO: alert & oriented x 3 with fluent speech, no focal motor/sensory deficits  LABORATORY DATA:  I have reviewed the data as listed    Component Value Date/Time   NA 138 02/15/2019 1013   NA 137 05/31/2017 0931   K 3.5 02/15/2019 1013   K 3.5 05/31/2017 0931   CL 96 (L) 02/15/2019 1013   CL 99 12/20/2012 1336   CO2 29 02/15/2019 1013   CO2 25 05/31/2017 0931   GLUCOSE 145 (H) 02/15/2019 1013   GLUCOSE 161 (H) 05/31/2017 0931   GLUCOSE 110 (H) 12/20/2012 1336   BUN 12 02/15/2019 1013   BUN 10.9 05/31/2017 0931   CREATININE 0.75 02/15/2019 1013   CREATININE 0.63 07/05/2018 1009   CREATININE 0.8 05/31/2017 0931   CALCIUM 9.9 02/15/2019 1013   CALCIUM 10.6 (H) 05/31/2017 0931   PROT 7.4 02/15/2019 1013   PROT 7.5 05/31/2017 0931   ALBUMIN 3.8 02/15/2019 1013   ALBUMIN 4.2 05/31/2017 0931   AST 13 (L) 02/15/2019 1013   AST 16 05/31/2017 0931   ALT 8 02/15/2019 1013   ALT 15 05/31/2017 0931   ALKPHOS 72 02/15/2019 1013   ALKPHOS 91 05/31/2017  0931   BILITOT 0.4 02/15/2019 1013   BILITOT 0.33 05/31/2017 0931   GFRNONAA >60 02/15/2019 1013   GFRNONAA 87 07/05/2018 1009   GFRAA >60 02/15/2019 1013   GFRAA 100 07/05/2018 1009    No results found for: SPEP, UPEP  Lab Results  Component Value Date   WBC 4.6 02/15/2019   NEUTROABS 4.2 02/15/2019   HGB 10.5 (L) 02/15/2019   HCT 33.3 (L) 02/15/2019   MCV 78.7 (L) 02/15/2019   PLT 320 02/15/2019      Chemistry      Component Value Date/Time   NA 138 02/15/2019 1013   NA 137 05/31/2017 0931   K 3.5 02/15/2019 1013   K 3.5 05/31/2017 0931   CL 96 (L) 02/15/2019 1013   CL 99 12/20/2012 1336   CO2 29 02/15/2019 1013   CO2 25 05/31/2017 0931   BUN 12 02/15/2019 1013   BUN 10.9 05/31/2017 0931   CREATININE 0.75 02/15/2019 1013   CREATININE 0.63 07/05/2018 1009   CREATININE 0.8 05/31/2017 0931      Component Value Date/Time   CALCIUM 9.9 02/15/2019 1013   CALCIUM 10.6 (H) 05/31/2017 0931   ALKPHOS 72 02/15/2019 1013   ALKPHOS 91 05/31/2017 0931   AST 13 (L) 02/15/2019 1013   AST 16 05/31/2017 0931   ALT 8 02/15/2019 1013   ALT 15 05/31/2017 0931   BILITOT 0.4 02/15/2019 1013   BILITOT 0.33 05/31/2017 0931       RADIOGRAPHIC STUDIES: I have reviewed multiple imaging studies with the patient I have personally reviewed the radiological images as listed and agreed with the findings in the report. Ct Abdomen Pelvis W Contrast  Result Date: 02/15/2019 CLINICAL DATA:  Right hip and leg pain recently. History of metastatic endometrial cancer in 2018. Remote history of left breast cancer. EXAM: CT ABDOMEN AND PELVIS WITH CONTRAST TECHNIQUE: Multidetector CT imaging of the abdomen and pelvis was performed using the standard protocol following bolus administration of intravenous contrast. CONTRAST:  155m OMNIPAQUE IOHEXOL 300 MG/ML  SOLN COMPARISON:  Abdominopelvic CT 08/29/2018. FINDINGS: Lower chest: Mild chronic scarring and bronchiectasis at both lung bases. No  suspicious nodularity, pleural or pericardial effusion. Hepatobiliary: There is a new low-density lesion in the dome of the right hepatic lobe, measuring 18 x 13 mm on image 10/2. This may demonstrate mild peripheral enhancement. 5 mm low-density lesion in the left lobe  on image 25/2 is stable, consistent with a benign finding. No evidence of gallstones, gallbladder wall thickening or biliary dilatation. Pancreas: Unremarkable. No pancreatic ductal dilatation or surrounding inflammatory changes. Spleen: Normal in size without focal abnormality. Adrenals/Urinary Tract: Both adrenal glands appear normal. There are stable small bilateral renal cysts. No evidence of enhancing renal mass, urinary tract calculus or hydronephrosis. The bladder appears normal. Stomach/Bowel: No evidence of bowel wall thickening, distention or surrounding inflammatory change. The appendix appears normal. Vascular/Lymphatic: There are no enlarged abdominal or pelvic lymph nodes. Mild aortic and branch vessel atherosclerosis. No acute vascular findings. Reproductive: Hysterectomy.  No adnexal mass. Other: No evidence of abdominal wall mass or hernia. No ascites. Musculoskeletal: Again demonstrated is a large expansile destructive lesion involving the left iliac bone. This has a persistent large soft tissue component which extends both anteriorly and posteriorly, measuring approximately 8.1 x 7.3 cm on image 55/2. Compared with the prior CT, the soft tissue components are lower in density, suggesting partial necrosis. The degree of bone destruction has slightly progressed. There is no definite involvement of the adjacent sacrum or new lesion. Bilateral femoral head avascular necrosis noted without subchondral collapse. IMPRESSION: 1. Slight progression of large expansile lytic lesion involving the left iliac bone. This demonstrates central low density, suggesting partial necrosis. This lesion was biopsied on 09/06/2018, revealing poorly  differentiated malignant neoplasm. 2. New low-density lesion in the dome of the right hepatic lobe worrisome for cystic or treated metastasis. 3. No other evidence of metastatic disease. No explanation for the patient's symptoms. 4. 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 Electronically Signed   By: Richardean Sale M.D.   On: 02/15/2019 19:52    All questions were answered. The patient knows to call the clinic with any problems, questions or concerns. No barriers to learning was detected.  I spent 55 minutes counseling the patient face to face. The total time spent in the appointment was 60 minutes and more than 50% was on counseling and review of test results  [name], MD 02/16/2019 12:31 PM

## 2019-02-16 NOTE — Assessment & Plan Note (Signed)
She continues to have bone pain She will continue calcium with vitamin D and tramadol Her recent imaging studies show regression of the size of the iliac bone lesion.  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

## 2019-02-16 NOTE — Assessment & Plan Note (Signed)
There are no signs of lesions in the lung

## 2019-02-20 ENCOUNTER — Encounter: Payer: Self-pay | Admitting: Hematology and Oncology

## 2019-02-20 ENCOUNTER — Other Ambulatory Visit: Payer: Self-pay | Admitting: Hematology and Oncology

## 2019-02-20 ENCOUNTER — Telehealth: Payer: Self-pay | Admitting: Hematology and Oncology

## 2019-02-20 DIAGNOSIS — C541 Malignant neoplasm of endometrium: Secondary | ICD-10-CM

## 2019-02-20 DIAGNOSIS — M5442 Lumbago with sciatica, left side: Secondary | ICD-10-CM

## 2019-02-20 MED ORDER — TAMOXIFEN CITRATE 20 MG PO TABS: ORAL_TABLET | ORAL | 9 refills | Status: DC

## 2019-02-20 MED ORDER — TRAMADOL HCL 50 MG PO TABS: 50.0000 mg | ORAL_TABLET | Freq: Three times a day (TID) | ORAL | 0 refills | Status: DC

## 2019-02-20 MED ORDER — MEGESTROL ACETATE 40 MG PO TABS: ORAL_TABLET | ORAL | 9 refills | Status: DC

## 2019-02-20 NOTE — Telephone Encounter (Signed)
I spoke with the patient and reviewed the plan of care She would like to take tamoxifen alternate with Megace as maintenance treatment We discussed the risk and benefits over the telephone and she agreed to proceed Prescription is sent to her pharmacy to start immediately I will see her back in a month for further follow-up.

## 2019-02-23 ENCOUNTER — Telehealth: Payer: Self-pay | Admitting: Hematology and Oncology

## 2019-02-23 NOTE — Telephone Encounter (Signed)
Scheduled per sch msg. Called and left msg for patient. Will mail printout

## 2019-02-27 ENCOUNTER — Telehealth: Payer: Self-pay | Admitting: *Deleted

## 2019-02-27 NOTE — Telephone Encounter (Signed)
-----   Message from [name], MD sent at 02/27/2019  8:34 AM EDT ----- Regarding: how is she doing Can you check and see how she is tolerating tamoxifen/megace?

## 2019-02-27 NOTE — Telephone Encounter (Signed)
Patient reports she is doing well with the Tamoxifen. She has not started the Megace yet. She will start this week. She reports some swelling in her ankles returning. She is going to use the compression socks like she used in the past. She will elevate her feet when sitting. She reports some difficulty sleeping. She is managing it right now. She states she is going to start walking in her neighborhood. Advised patient to not walk with groups of her neighbors and practice social distancing. Patient states she will call this office if the swelling or sleep disturbance becomes worse.

## 2019-03-07 ENCOUNTER — Telehealth: Payer: Self-pay | Admitting: Oncology

## 2019-03-07 NOTE — Telephone Encounter (Signed)
Tammy Hensley left a message saying that she has started taking Tamoxifen and her appetite is gone.  She said she is not eating which makes her feel tired and weak.  She said she wants to keep taking the Tamoxifen but is wondering if Dr. Alvy Bimler would be able to send her in a prescription to help with her appetite.

## 2019-03-07 NOTE — Telephone Encounter (Signed)
Called Jaci back and advised her of Dr. Calton Dach message.  She verbalized agreement and will stop taking Tamoxifen and switch over to Megace.

## 2019-03-07 NOTE — Telephone Encounter (Signed)
She is prescribed tamoxifen alternate with megace She can stop the tamoxifen now and switch over to megace. Megace will stimulate appetite

## 2019-03-21 ENCOUNTER — Other Ambulatory Visit: Payer: Self-pay

## 2019-03-21 ENCOUNTER — Ambulatory Visit: Payer: Medicare Other | Admitting: Adult Health Nurse Practitioner

## 2019-03-21 ENCOUNTER — Encounter: Payer: Self-pay | Admitting: Adult Health Nurse Practitioner

## 2019-03-21 VITALS — BP 107/56 | Temp 98.0°F | Wt 142.0 lb

## 2019-03-21 DIAGNOSIS — C541 Malignant neoplasm of endometrium: Secondary | ICD-10-CM | POA: Diagnosis not present

## 2019-03-21 DIAGNOSIS — Z79899 Other long term (current) drug therapy: Secondary | ICD-10-CM

## 2019-03-21 DIAGNOSIS — D72819 Decreased white blood cell count, unspecified: Secondary | ICD-10-CM

## 2019-03-21 DIAGNOSIS — R6889 Other general symptoms and signs: Secondary | ICD-10-CM | POA: Diagnosis not present

## 2019-03-21 DIAGNOSIS — E559 Vitamin D deficiency, unspecified: Secondary | ICD-10-CM

## 2019-03-21 DIAGNOSIS — E119 Type 2 diabetes mellitus without complications: Secondary | ICD-10-CM

## 2019-03-21 DIAGNOSIS — Z0000 Encounter for general adult medical examination without abnormal findings: Secondary | ICD-10-CM

## 2019-03-21 DIAGNOSIS — E039 Hypothyroidism, unspecified: Secondary | ICD-10-CM | POA: Diagnosis not present

## 2019-03-21 DIAGNOSIS — I10 Essential (primary) hypertension: Secondary | ICD-10-CM

## 2019-03-21 DIAGNOSIS — M8580 Other specified disorders of bone density and structure, unspecified site: Secondary | ICD-10-CM

## 2019-03-21 DIAGNOSIS — Z0001 Encounter for general adult medical examination with abnormal findings: Secondary | ICD-10-CM

## 2019-03-21 DIAGNOSIS — E782 Mixed hyperlipidemia: Secondary | ICD-10-CM

## 2019-03-21 DIAGNOSIS — I700 Atherosclerosis of aorta: Secondary | ICD-10-CM

## 2019-03-21 DIAGNOSIS — G4700 Insomnia, unspecified: Secondary | ICD-10-CM

## 2019-03-21 DIAGNOSIS — D509 Iron deficiency anemia, unspecified: Secondary | ICD-10-CM

## 2019-03-21 NOTE — Progress Notes (Signed)
Virtual Visit via Telephone Note  I connected with Tammy Hensley on 03/21/19 at  2:15 PM EDT by telephone and verified that I am speaking with the correct person using two identifiers.   I discussed the limitations, risks, security and privacy concerns of performing an evaluation and management service by telephone and the availability of in person appointments. I also discussed with the patient that there may be a patient responsible charge related to this service. The patient expressed understanding and agreed to proceed.   MEDICARE ANNUAL WELLNESS VISIT AND FOLLOW UP Assessment:    Dyesha was seen today for medicare wellness.  Diagnoses and all orders for this visit:  Medicare annual wellness visit, subsequent Yearly  Essential hypertension Hypertension Continue medication: Taking maxide 37.5/25mg  daily  Monitor blood pressure at home; call if consistently over 130/80 Continue DASH diet.   Reminder to go to the ER if any CP, SOB, nausea, dizziness, severe HA, changes vision/speech, left arm numbness and tingling and jaw pain.  Atherosclerosis of abdominal aorta (HCC) Control B/P, lipids & glucose Continue to monitor  Endometrial cancer ([place]) Taking Tamoxifen and megace alternating Follows with [place]  Hypothyroidism, unspecified type Taking levothyroxine 47mcg daily Will check level next office visit  T2_NIDDM Continue Metformin, taking one tablet by mouth TID. Discussed general issues about diabetes pathophysiology and management., Educational material distributed., Suggested low cholesterol diet., Encouraged aerobic exercise., Discussed foot care., Reminded to get yearly retinal exam.  Osteopenia, unspecified location Continue calcium and Vit D supplementation  Chronic leukopenia Continue to monitor Will check labs next office visit  Mixed hyperlipidemia Cholesterol Continue medications:pravachol Continue low cholesterol diet and exercise.  Check  lipid panel.   Insomnia, unspecified type Doing well with this   Iron deficiency anemia, unspecified iron deficiency anemia type Will check level next office visit.  Vitamin D deficiency Continue supplementation  Medication management continued  Follow Up Instructions:    I discussed the assessment and treatment plan with the patient. The patient was provided an opportunity to ask questions and all were answered. The patient agreed with the plan and demonstrated an understanding of the instructions.   The patient was advised to call back or seek an in-person evaluation if the symptoms worsen or if the condition fails to improve as anticipated.  I provided 30 minutes of non-face-to-face time during this encounter including counseling, chart review, and critical decision making was preformed.   Future Appointments  Date Time Provider [place]  03/31/2019  9:00 AM [facility] INFUSION [facility] None  04/07/2019  8:30 AM [facility] LAB 2 [facility] None  04/07/2019  8:45 AM [facility] [place] FLUSH [facility] None  04/07/2019  9:15 AM [name], MD [facility] None  04/07/2019 10:30 AM [facility] INFUSION [facility] None  07/24/2019  9:00 AM [name], MD [facility] None  04/09/2020  2:00 PM Garnet Sierras, NP [facility] None      Plan:   During the course of the visit the patient was educated and counseled about appropriate screening and preventive services including:    Pneumococcal vaccine   Influenza vaccine  Prevnar 13  Td vaccine  Screening electrocardiogram, deferred, telephone visit [place].  Colorectal cancer screening  Diabetes screening  Glaucoma screening  Nutrition counseling    Subjective:  Tammy Hensley is a 78 y.o. female who presents for Medicare Annual Wellness Visit and 3 month follow up for HTN, hyperlipidemia, prediabetes,anemia and vitamin D Def. She is being treated for endometrial cancer and follow with the [facility]. She reports that she is taking tamoxifen and it has made her feel bad.  She has been alternating this with megace to help with her appetite.  She reports that she is very fatigued.  She has help around the home and assistance to get food, medications and necessities during [place] restrictions.  Her blood pressure has  been controlled at home, today their BP is BP: (!) 107/56.  She has been checking this every other day. She does not workout. She denies chest pain, shortness of breath, dizziness.  She is on cholesterol medication and denies myalgias. Her cholesterol is at goal. The cholesterol last visit was:   Lab Results  Component Value Date   CHOL 142 07/05/2018   HDL 52 07/05/2018   LDLCALC 70 07/05/2018   TRIG 115 07/05/2018   CHOLHDL 2.7 07/05/2018   She has not been working on diet and exercise for abnormal/prediabetes, and denies hyperglycemia, hypoglycemia , increased appetite, nausea, paresthesia of the feet, polydipsia, polyuria, visual disturbances and vomiting. Last A1C in the office was:  Lab Results  Component Value Date   HGBA1C 5.9 (H) 07/05/2018   Last GFR Lab Results  Component Value Date   GFRNONAA >60 03/23/2019     Lab Results  Component Value Date   GFRAA >60 03/23/2019   Patient is on Vitamin D supplement.   Lab Results  Component Value Date   VD25OH 92 07/05/2018      Medication Review:  Current Outpatient Medications (Endocrine & Metabolic):  .  levothyroxine (SYNTHROID, LEVOTHROID) 88 MCG tablet, Take 1 tablet (88 mcg total) by mouth daily before breakfast. .  metFORMIN (GLUCOPHAGE-XR) 500 MG 24 hr tablet, Take 1 tablet (500 mg total) by mouth 2 (two) times daily. (Patient taking differently: Take 500 mg by mouth 3 (three) times daily. )   Current Outpatient Medications (Cardiovascular):  .  furosemide (LASIX) 20 MG tablet, Once daily for 3 days, repeat as needed for lower extremity edema .  pravastatin (PRAVACHOL) 40 MG tablet, Take 1  tablet (40 mg total) by mouth every evening. .  triamterene-hydrochlorothiazide (MAXZIDE-25) 37.5-25 MG tablet, Take 1 tablet by mouth daily. Takes in the am   Current Outpatient Medications (Respiratory):  .  loratadine-pseudoephedrine (CLARITIN-D 24 HOUR) 10-240 MG 24 hr tablet, Take 1 tablet by mouth daily as needed for allergies.   Current Outpatient Medications (Analgesics):  .  acetaminophen (TYLENOL) 500 MG tablet, Take 500 mg by mouth daily as needed for moderate pain or headache. Marland Kitchen  aspirin 81 MG tablet, Take 81 mg by mouth daily.   .  traMADol (ULTRAM) 50 MG tablet, Take 1 tablet (50 mg total) by mouth 3 (three) times daily. Take 1/2 to 1 tablet every 4 hours as needed for severe Back Pain     Current Outpatient Medications (Other):  Marland Kitchen  Calcium Carbonate-Vitamin D (CALCIUM 600 + D PO), Take 1 tablet by mouth 2 (two) times daily.  .  Cholecalciferol (VITAMIN D) 2000 units CAPS, Take 2,000 Units by mouth every other day. .  megestrol (MEGACE) 40 MG tablet, Take 80 mg twice a day for 3 weeks then alternate with tamoxifen .  Multiple Vitamin (MULTIVITAMIN WITH MINERALS) TABS tablet, Take 1 tablet by mouth daily. .  ondansetron (ZOFRAN) 8 MG tablet, Take 1 tablet (8 mg total) by mouth every 8 (eight) hours as needed for refractory nausea / vomiting. Start on day 3 after chemo. (Patient not taking: Reported on 03/21/2019) .  prochlorperazine (COMPAZINE)  10 MG tablet, Take 1 tablet (10 mg total) by mouth every 6 (six) hours as needed (Nausea or vomiting). (Patient not taking: Reported on 03/21/2019)   Facility-Administered Medications Ordered in Other Visits (Other):  .  sodium chloride flush (NS) 0.9 % injection 10 mL No current facility-administered medications for this visit.   Allergies: Allergies  Allergen Reactions  . Omnipaque [Iohexol] Hives    Pt will need 13 hr prep in the future--SLG  . Calan [Verapamil]     Constipation   . Effexor [Venlafaxine]     unknown  .  Erythromycin Nausea And Vomiting  . Femara [Letrozole] Swelling  . Fosamax [Alendronate]     aching  . Ibuprofen     Dizziness, incoherent   . Paxil [Paroxetine Hcl] Nausea Only  . Remeron [Mirtazapine]     Weight gain   . Vasotec [Enalapril] Cough    Current Problems (verified) has Malignant neoplasm of upper-outer quadrant of left breast in female, estrogen receptor positive ([place]); Hypothyroidism; Hypertension; Hyperlipidemia; Vitamin D deficiency; Iron deficiency anemia; Breast cancer, left breast ([place]); Medication management; BMI 25.0-25.9,adult; Osteopenia; Endometrial cancer ([place]); FIGO stage II endometrial cancer ([place]); Atherosclerosis of abdominal aorta ([place]); Family history of cancer; Genetic testing; T2_NIDDM; Insomnia; Chronic leukopenia; Metastasis to bone ([place]); Metastasis to lung [facility]); Cancer associated pain; Malignant cachexia ([place]); Other constipation; Preventive measure; Physical debility; Goals of care, counseling/discussion; Fluid retention in legs; Pancytopenia, acquired ([place]); Anemia due to antineoplastic chemotherapy; and 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  Screening Tests Immunization History  Administered Date(s) Administered  . Influenza, High Dose Seasonal PF 09/07/2017  . Influenza,inj,Quad PF,6+ Mos 11/12/2016, 09/08/2018  . Pneumococcal Conjugate-13 07/05/2018  . Pneumococcal-Unspecified 12/29/2011  . Td 12/29/2011    Preventative care: Last colonoscopy: 2017  Prior vaccinations: TD or Tdap: 2013  Influenza: 2019  Pneumococcal: 2019 Prevnar13: 2013 Shingles/Zostavax: Due, previous chemo DEXA: 2011  Names of Other Physician/Practitioners you currently use: 1. [facility] here for primary care 2. Dr. Delman Cheadle, 2019 3. Dr. Lavone Neri, 2020 Patient Care Team: [name], MD as PCP - General (Internal Medicine) [name], MD as Consulting Physician (Gastroenterology) [name], MD as  Consulting Physician (Ophthalmology) Cheri Fowler, MD as Consulting Physician (Obstetrics and Gynecology) [name], MD as Consulting Physician (Hematology and Oncology) [name], MD as Consulting Physician (Radiation Oncology)  Surgical: She  has a past surgical history that includes Breast surgery (2006); perineum (1973); Robotic assisted lap vaginal hysterectomy (N/A, 10/13/2016); Robotic assisted salpingo oophorectomy (Bilateral, 10/13/2016); Sentinel node biopsy (N/A, 10/13/2016); Breast lumpectomy (Left, 2006); and IR IMAGING GUIDED PORT INSERTION (09/29/2018). Family Her family history includes Breast cancer (age of onset: 77) in her paternal aunt; Cancer in her father and paternal grandmother; Heart attack in her father and mother; Heart disease in her mother; Prostate cancer (age of onset: 42) in her brother; Prostate cancer (age of onset: 42) in her brother; Prostate cancer (age of onset: 71) in her father; Prostate cancer (age of onset: 26) in her paternal uncle. Social history  She reports that she has never smoked. She has never used smokeless tobacco. She reports that she does not drink alcohol or use drugs.  MEDICARE WELLNESS OBJECTIVES: Physical activity: Current Exercise Habits: The patient does not participate in regular exercise at present, Exercise limited by: Other - see comments(Fatigued) Cardiac risk factors: Cardiac Risk Factors include: advanced age (>48men, >1 women);dyslipidemia;hypertension Depression/mood screen:   Depression screen [facility] 2/9 03/21/2019  Decreased Interest 0  Down, Depressed, Hopeless 0  PHQ - 2 Score 0  Some recent data might be hidden    ADLs:  In your present state of health, do you have any difficulty performing the following activities: 03/21/2019 01/16/2019  Hearing? N N  Vision? N N  Difficulty concentrating or making decisions? N N  Walking or climbing stairs? N N  Dressing or bathing? N N  Doing errands, shopping? N N   Preparing Food and eating ? N -  Using the Toilet? N -  In the past six months, have you accidently leaked urine? N -  Do you have problems with loss of bowel control? Y -  Comment Worsening since chemo. -  Managing your Medications? N -  Managing your Finances? N -  Housekeeping or managing your Housekeeping? N -  Some recent data might be hidden     Cognitive Testing  Alert? Yes  Normal Appearance?Yes  Oriented to person? Yes  Place? Yes   Time? Yes  Recall of three objects?  Yes  Can perform simple calculations? Yes  Displays appropriate judgment?Yes  Can read the correct time from a watch face?Yes  EOL planning: Does Patient Have a Medical Advance Directive?: Yes Type of Advance Directive: Healthcare Power of Attorney, Living will Does patient want to make changes to medical advance directive?: Yes (Inpatient - patient defers changing a medical advance directive at this time - Information given)   Objective:   Today's Vitals   03/21/19 1356  BP: (!) 107/56  Temp: 98 F (36.7 C)  Weight: 142 lb (64.4 kg)  PainSc: 4   PainLoc: Hip   Body mass index is 25.97 kg/m.  General : Well sounding patient in no apparent distress HEENT: no hoarseness, no cough for duration of visit Lungs: speaks in complete sentences, no audible wheezing, no apparent distress Neurological: alert, oriented x 3 Psychiatric: pleasant, judgement appropriate   Medicare Attestation I have personally reviewed: The patient's medical and social history Their use of alcohol, tobacco or illicit drugs Their current medications and supplements The patient's functional ability including ADLs,fall risks, home safety risks, cognitive, and hearing and visual impairment Diet and physical activities Evidence for depression or mood disorders  The patient's weight, height, BMI, and visual acuity have been recorded in the chart.  I have made referrals, counseling, and provided education to the patient based  on review of the above and I have provided the patient with a written personalized care plan for preventive services.     Garnet Sierras, NP [facility] 03/21/2019  4:33 AM

## 2019-03-21 NOTE — Patient Instructions (Addendum)
Ms. Dimmitt , Thank you for taking time to speak with you for your Medicare Wellness Visit on 03/21/19. I appreciate your ongoing commitment to your health goals. Please review the following plan we discussed and let me know if I can assist you in the future.     This is a list of the screening recommended for you and due dates:  Health Maintenance  Topic Date Due  . Complete foot exam   05/27/2018  . Eye exam for diabetics  10/05/2018  . Mammogram  11/19/2018  . Hemoglobin A1C  01/05/2019  . Flu Shot  06/24/2019  . Urine Protein Check  07/06/2019  . Colon Cancer Screening  03/10/2021  . Tetanus Vaccine  12/28/2021  . DEXA scan (bone density measurement)  Last 2011, DUE  . Pneumonia vaccines  Completed   You are due for a bone density scan.  Last was completed at the [facility].  We will put an order in for this.  You can contact their office for an appointment at your convenience.   >>>>>>>>>>>>>>>>>>>>>>>>>>>>>>>>>>>>>>>>>>>>>>>>>>>>>>> Coronavirus (COVID-19) Are you at risk?  Are you at risk for the Coronavirus (COVID-19)?  To be considered HIGH RISK for Coronavirus (COVID-19), you have to meet the following criteria:  . Traveled to [place], [place], [place], [place] or [place]; or in the [place] to [place], [place], [place]  . or [place]; and have fever, cough, and shortness of breath within the last 2 weeks of travel OR . Been in close contact with a person diagnosed with COVID-19 within the last 2 weeks and have  . fever, cough,and shortness of breath .  . IF YOU DO NOT MEET THESE CRITERIA, YOU ARE CONSIDERED LOW RISK FOR COVID-19.  What to do if you are HIGH RISK for COVID-19?  Marland Kitchen If you are having a medical emergency, call 911. . Seek medical care right away. Before you go to a doctor's office, urgent care or emergency department, .  call ahead and tell them about your recent travel, contact with someone diagnosed with COVID-19  .  and  your symptoms.  . You should receive instructions from your physician's office regarding next steps of care.  . When you arrive at healthcare provider, tell the healthcare staff immediately you have returned from  . visiting [place], [place], [place], [place] or [place]; or traveled in the [place] to [place], [place],  . [place] or [place] in the last two weeks or you have been in close contact with a person diagnosed with  . COVID-19 in the last 2 weeks.   . Tell the health care staff about your symptoms: fever, cough and shortness of breath. . After you have been seen by a medical provider, you will be either: o Tested for (COVID-19) and discharged home on quarantine except to seek medical care if  o symptoms worsen, and asked to  - Stay home and avoid contact with others until you get your results (4-5 days)  - Avoid travel on public transportation if possible (such as bus, train, or airplane) or o Sent to the Emergency Department by EMS for evaluation, COVID-19 testing  and  o possible admission depending on your condition and test results.  What to do if you are LOW RISK for COVID-19?  Reduce your risk of any infection by using the same precautions used for avoiding the common cold or flu:  Marland Kitchen Wash your hands often with soap and warm  water for at least 20 seconds.  If soap and water are not readily available,  . use an alcohol-based hand sanitizer with at least 60% alcohol.  . If coughing or sneezing, cover your mouth and nose by coughing or sneezing into the elbow areas of your shirt or coat, .  into a tissue or into your sleeve (not your hands). . Avoid shaking hands with others and consider head nods or verbal greetings only. . Avoid touching your eyes, nose, or mouth with unwashed hands.  . Avoid close contact with people who are sick. . Avoid places or events with large numbers of people in one location, like concerts or sporting events. . Carefully consider travel  plans you have or are making. . If you are planning any travel outside or inside the [place], visit the CDC's Travelers' Health webpage for the latest health notices. . If you have some symptoms but not all symptoms, continue to monitor at home and seek medical attention  . if your symptoms worsen. . If you are having a medical emergency, call 911.   . >>>>>>>>>>>>>>>>>>>>>>>>>>>>>>>>> . We Do NOT Approve of  [facility], [employer]  Our Patients  To Do Home Visits & We Do NOT Approve of [employer] > > > > > > > > > > > > > > > > > > > > > > > > > > > > > > > > > > > > > > >  Preventive Care for Adults  A healthy lifestyle and preventive care can promote health and wellness. Preventive health guidelines for women include the following key practices.  A routine yearly physical is a good way to check with your health care provider about your health and preventive screening. It is a chance to share any concerns and updates on your health and to receive a thorough exam.  Visit your dentist for a routine exam and preventive care every 6 months. Brush your teeth twice a day and floss once a day. Good oral hygiene prevents tooth decay and gum disease.  The frequency of eye exams is based on your age, health, family medical history, use of contact lenses, and other factors. Follow your health care provider's recommendations for frequency of eye exams.  Eat a healthy diet. Foods like vegetables, fruits, whole grains, low-fat dairy products, and lean protein foods contain the nutrients you need without too many calories. Decrease your intake of foods high in solid fats, added sugars, and salt. Eat the right amount of calories for you. Get information about a proper diet from your health care provider, if necessary.  Regular physical exercise is one of the most important things you can do for your health. Most adults should get at least 150 minutes of moderate-intensity exercise  (any activity that increases your heart rate and causes you to sweat) each week. In addition, most adults need muscle-strengthening exercises on 2 or more days a week.  Maintain a healthy weight. The body mass index (BMI) is a screening tool to identify possible weight problems. It provides an estimate of body fat based on height and weight. Your health care provider can find your BMI and can help you achieve or maintain a healthy weight. For adults 20 years and older:  A BMI below 18.5 is considered underweight.  A BMI of 18.5 to 24.9 is normal.  A BMI of 25 to 29.9 is considered overweight.  A BMI of 30 and above is considered obese.  Maintain normal blood lipids and cholesterol levels by exercising and minimizing your intake of saturated fat. Eat a balanced diet with plenty of fruit and vegetables. If your lipid or cholesterol levels are high, you are over 50, or you are at high risk for heart disease, you may need your cholesterol levels checked more frequently. Ongoing high lipid and cholesterol levels should be treated with medicines if diet and exercise are not working.  If you smoke, find out from your health care provider how to quit. If you do not use tobacco, do not start.  Lung cancer screening is recommended for adults aged 80-80 years who are at high risk for developing lung cancer because of a history of smoking. A yearly low-dose CT scan of the lungs is recommended for people who have at least a 30-pack-year history of smoking and are a current smoker or have quit within the past 15 years. A pack year of smoking is smoking an average of 1 pack of cigarettes a day for 1 year (for example: 1 pack a day for 30 years or 2 packs a day for 15 years). Yearly screening should continue until the smoker has stopped smoking for at least 15 years. Yearly screening should be stopped for people who develop a health problem that would prevent them from having lung cancer treatment.  Avoid use of  street drugs. Do not share needles with anyone. Ask for help if you need support or instructions about stopping the use of drugs.  High blood pressure causes heart disease and increases the risk of stroke.  Ongoing high blood pressure should be treated with medicines if weight loss and exercise do not work.  If you are 6-44 years old, ask your health care provider if you should take aspirin to prevent strokes.  Diabetes screening involves taking a blood sample to check your fasting blood sugar level. This should be done once every 3 years, after age 33, if you are within normal weight and without risk factors for diabetes. Testing should be considered at a younger age or be carried out more frequently if you are overweight and have at least 1 risk factor for diabetes.  Breast cancer screening is essential preventive care for women. You should practice "breast self-awareness." This means understanding the normal appearance and feel of your breasts and may include breast self-examination. Any changes detected, no matter how small, should be reported to a health care provider. Women in their 71s and 30s should have a clinical breast exam (CBE) by a health care provider as part of a regular health exam every 1 to 3 years. After age 78, women should have a CBE every year. Starting at age 27, women should consider having a mammogram (breast X-ray test) every year. Women who have a family history of breast cancer should talk to their health care provider about genetic screening. Women at a high risk of breast cancer should talk to their health care providers about having an MRI and a mammogram every year.  Breast cancer gene (BRCA)-related cancer risk assessment is recommended for women who have family members with BRCA-related cancers. BRCA-related cancers include breast, ovarian, tubal, and peritoneal cancers. Having family members with these cancers may be associated with an increased risk for harmful changes  (mutations) in the breast cancer genes BRCA1 and BRCA2. Results of the assessment will determine the need for genetic counseling and BRCA1 and BRCA2 testing.  Routine pelvic exams to screen for cancer are no longer recommended for  nonpregnant women who are considered low risk for cancer of the pelvic organs (ovaries, uterus, and vagina) and who do not have symptoms. Ask your health care provider if a screening pelvic exam is right for you.  If you have had past treatment for cervical cancer or a condition that could lead to cancer, you need Pap tests and screening for cancer for at least 20 years after your treatment. If Pap tests have been discontinued, your risk factors (such as having a new sexual partner) need to be reassessed to determine if screening should be resumed. Some women have medical problems that increase the chance of getting cervical cancer. In these cases, your health care provider may recommend more frequent screening and Pap tests.    Colorectal cancer can be detected and often prevented. Most routine colorectal cancer screening begins at the age of 41 years and continues through age 28 years. However, your health care provider may recommend screening at an earlier age if you have risk factors for colon cancer. On a yearly basis, your health care provider may provide home test kits to check for hidden blood in the stool. Use of a small camera at the end of a tube, to directly examine the colon (sigmoidoscopy or colonoscopy), can detect the earliest forms of colorectal cancer. Talk to your health care provider about this at age 23, when routine screening begins.  Direct exam of the colon should be repeated every 5-10 years through age 53 years, unless early forms of pre-cancerous polyps or small growths are found.  Osteoporosis is a disease in which the bones lose minerals and strength with aging. This can result in serious bone fractures or breaks. The risk of osteoporosis can be  identified using a bone density scan. Women ages 40 years and over and women at risk for fractures or osteoporosis should discuss screening with their health care providers. Ask your health care provider whether you should take a calcium supplement or vitamin D to reduce the rate of osteoporosis.  Menopause can be associated with physical symptoms and risks. Hormone replacement therapy is available to decrease symptoms and risks. You should talk to your health care provider about whether hormone replacement therapy is right for you.  Use sunscreen. Apply sunscreen liberally and repeatedly throughout the day. You should seek shade when your shadow is shorter than you. Protect yourself by wearing long sleeves, pants, a wide-brimmed hat, and sunglasses year round, whenever you are outdoors.  Once a month, do a whole body skin exam, using a mirror to look at the skin on your back. Tell your health care provider of new moles, moles that have irregular borders, moles that are larger than a pencil eraser, or moles that have changed in shape or color.  Stay current with required vaccines (immunizations).  Influenza vaccine. All adults should be immunized every year.  Tetanus, diphtheria, and acellular pertussis (Td, Tdap) vaccine. Pregnant women should receive 1 dose of Tdap vaccine during each pregnancy. The dose should be obtained regardless of the length of time since the last dose. Immunization is preferred during the 27th-36th week of gestation. An adult who has not previously received Tdap or who does not know her vaccine status should receive 1 dose of Tdap. This initial dose should be followed by tetanus and diphtheria toxoids (Td) booster doses every 10 years. Adults with an unknown or incomplete history of completing a 3-dose immunization series with Td-containing vaccines should begin or complete a primary immunization series including a  Tdap dose. Adults should receive a Td booster every 10  years.    Zoster vaccine. One dose is recommended for adults aged 14 years or older unless certain conditions are present.    Pneumococcal 13-valent conjugate (PCV13) vaccine. When indicated, a person who is uncertain of her immunization history and has no record of immunization should receive the PCV13 vaccine. An adult aged 38 years or older who has certain medical conditions and has not been previously immunized should receive 1 dose of PCV13 vaccine. This PCV13 should be followed with a dose of pneumococcal polysaccharide (PPSV23) vaccine. The PPSV23 vaccine dose should be obtained at least 1 or more year(s) after the dose of PCV13 vaccine. An adult aged 65 years or older who has certain medical conditions and previously received 1 or more doses of PPSV23 vaccine should receive 1 dose of PCV13. The PCV13 vaccine dose should be obtained 1 or more years after the last PPSV23 vaccine dose.    Pneumococcal polysaccharide (PPSV23) vaccine. When PCV13 is also indicated, PCV13 should be obtained first. All adults aged 41 years and older should be immunized. An adult younger than age 48 years who has certain medical conditions should be immunized. Any person who resides in a nursing home or long-term care facility should be immunized. An adult smoker should be immunized. People with an immunocompromised condition and certain other conditions should receive both PCV13 and PPSV23 vaccines. People with human immunodeficiency virus (HIV) infection should be immunized as soon as possible after diagnosis. Immunization during chemotherapy or radiation therapy should be avoided. Routine use of PPSV23 vaccine is not recommended for American Indians, [place] Natives, or people younger than 65 years unless there are medical conditions that require PPSV23 vaccine. When indicated, people who have unknown immunization and have no record of immunization should receive PPSV23 vaccine. One-time revaccination 5 years after the  first dose of PPSV23 is recommended for people aged 19-64 years who have chronic kidney failure, nephrotic syndrome, asplenia, or immunocompromised conditions. People who received 1-2 doses of PPSV23 before age 43 years should receive another dose of PPSV23 vaccine at age 19 years or later if at least 5 years have passed since the previous dose. Doses of PPSV23 are not needed for people immunized with PPSV23 at or after age 69 years.   Preventive Services / Frequency  Ages 56 years and over  Blood pressure check.  Lipid and cholesterol check.  Lung cancer screening. / Every year if you are aged 11-80 years and have a 30-pack-year history of smoking and currently smoke or have quit within the past 15 years. Yearly screening is stopped once you have quit smoking for at least 15 years or develop a health problem that would prevent you from having lung cancer treatment.  Clinical breast exam.** / Every year after age 7 years.   BRCA-related cancer risk assessment.** / For women who have family members with a BRCA-related cancer (breast, ovarian, tubal, or peritoneal cancers).  Mammogram.** / Every year beginning at age 58 years and continuing for as long as you are in good health. Consult with your health care provider.  Pap test.** / Every 3 years starting at age 22 years through age 79 or 54 years with 3 consecutive normal Pap tests. Testing can be stopped between 65 and 70 years with 3 consecutive normal Pap tests and no abnormal Pap or HPV tests in the past 10 years.  Fecal occult blood test (FOBT) of stool. / Every year beginning at  age 50 years and continuing until age 54 years. You may not need to do this test if you get a colonoscopy every 10 years.  Flexible sigmoidoscopy or colonoscopy.** / Every 5 years for a flexible sigmoidoscopy or every 10 years for a colonoscopy beginning at age 54 years and continuing until age 29 years.  Hepatitis C blood test.** / For all people born from  [birth year] through 1965 and any individual with known risks for hepatitis C.  Osteoporosis screening.** / A one-time screening for women ages 65 years and over and women at risk for fractures or osteoporosis.  Skin self-exam. / Monthly.  Influenza vaccine. / Every year.  Tetanus, diphtheria, and acellular pertussis (Tdap/Td) vaccine.** / 1 dose of Td every 10 years.  Zoster vaccine.** / 1 dose for adults aged 7 years or older.  Pneumococcal 13-valent conjugate (PCV13) vaccine.** / Consult your health care provider.  Pneumococcal polysaccharide (PPSV23) vaccine.** / 1 dose for all adults aged 1 years and older. Screening for abdominal aortic aneurysm (AAA)  by ultrasound is recommended for people who have history of high blood pressure or who are current or former smokers. ++++++++++++++++++++ Recommend Adult Low Dose Aspirin or  coated  Aspirin 81 mg daily  To reduce risk of Colon Cancer 20 %,  Skin Cancer 26 % ,  Melanoma 46%  and  Pancreatic cancer 60% ++++++++++++++++++++ Vitamin D goal  is between 70-100.  Please make sure that you are taking your Vitamin D as directed.  It is very important as a natural anti-inflammatory  helping hair, skin, and nails, as well as reducing stroke and heart attack risk.  It helps your bones and helps with mood. It also decreases numerous cancer risks so please take it as directed.  Low Vit D is associated with a 200-300% higher risk for CANCER  and 200-300% higher risk for HEART   ATTACK  &  STROKE.   .....................................Marland Kitchen It is also associated with higher death rate at younger ages,  autoimmune diseases like Rheumatoid arthritis, Lupus, Multiple Sclerosis.    Also many other serious conditions, like depression, Alzheimer's Dementia, infertility, muscle aches, fatigue, fibromyalgia - just to name a few. ++++++++++++++++++ Recommend the book "The END of DIETING" by Dr Excell Seltzer  & the book "The END of DIABETES " by Dr Excell Seltzer At [email or web address] - get book & Audio CD's    Being diabetic has a  300% increased risk for heart attack, stroke, cancer, and alzheimer- type vascular dementia. It is very important that you work harder with diet by avoiding all foods that are white. Avoid white rice (brown & wild rice is OK), white potatoes (sweetpotatoes in moderation is OK), White bread or wheat bread or anything made out of white flour like bagels, donuts, rolls, buns, biscuits, cakes, pastries, cookies, pizza crust, and pasta (made from white flour & egg whites) - vegetarian pasta or spinach or wheat pasta is OK. Multigrain breads like Arnold's or Pepperidge Farm, or multigrain sandwich thins or flatbreads.  Diet, exercise and weight loss can reverse and cure diabetes in the early stages.  Diet, exercise and weight loss is very important in the control and prevention of complications of diabetes which affects every system in your body, ie. Brain - dementia/stroke, eyes - glaucoma/blindness, heart - heart attack/heart failure, kidneys - dialysis, stomach - gastric paralysis, intestines - malabsorption, nerves - severe painful neuritis, circulation - gangrene & loss of a leg(s), and finally cancer and Alzheimers.  I recommend avoid fried & greasy foods,  sweets/candy, white rice (brown or wild rice or Quinoa is OK), white potatoes (sweet potatoes are OK) - anything made from white flour - bagels, doughnuts, rolls, buns, biscuits,white and wheat breads, pizza crust and traditional pasta made of white flour & egg white(vegetarian pasta or spinach or wheat pasta is OK).  Multi-grain bread is OK - like multi-grain flat bread or sandwich thins. Avoid alcohol in excess. Exercise is also important.    Eat all the vegetables you want - avoid meat, especially red meat and dairy - especially cheese.  Cheese is the most concentrated form of trans-fats which is the worst thing to clog up our arteries. Veggie cheese is OK which can be found in the  fresh produce section at [employer] or [employer] or [employer]  +++++++++++++++++++ DASH Eating Plan  DASH stands for "Dietary Approaches to Stop Hypertension."   The DASH eating plan is a healthy eating plan that has been shown to reduce high blood pressure (hypertension). Additional health benefits may include reducing the risk of type 2 diabetes mellitus, heart disease, and stroke. The DASH eating plan may also help with weight loss. WHAT DO I NEED TO KNOW ABOUT THE DASH EATING PLAN? For the DASH eating plan, you will follow these general guidelines:  Choose foods with a percent daily value for sodium of less than 5% (as listed on the food label).  Use salt-free seasonings or herbs instead of table salt or sea salt.  Check with your health care provider or pharmacist before using salt substitutes.  Eat lower-sodium products, often labeled as "lower sodium" or "no salt added."  Eat fresh foods.  Eat more vegetables, fruits, and low-fat dairy products.  Choose whole grains. Look for the word "whole" as the first word in the ingredient list.  Choose fish   Limit sweets, desserts, sugars, and sugary drinks.  Choose heart-healthy fats.  Eat veggie cheese   Eat more home-cooked food and less restaurant, buffet, and fast food.  Limit fried foods.  Cook foods using methods other than frying.  Limit canned vegetables. If you do use them, rinse them well to decrease the sodium.  When eating at a restaurant, ask that your food be prepared with less salt, or no salt if possible.                      WHAT FOODS CAN I EAT? Read Dr Fara Olden Fuhrman's books on The End of Dieting & The End of Diabetes  Grains Whole grain or whole wheat bread. Brown rice. Whole grain or whole wheat pasta. Quinoa, bulgur, and whole grain cereals. Low-sodium cereals. Corn or whole wheat flour tortillas. Whole grain cornbread. Whole grain crackers. Low-sodium crackers.  Vegetables Fresh or frozen  vegetables (raw, steamed, roasted, or grilled). Low-sodium or reduced-sodium tomato and vegetable juices. Low-sodium or reduced-sodium tomato sauce and paste. Low-sodium or reduced-sodium canned vegetables.   Fruits All fresh, canned (in natural juice), or frozen fruits.  Protein Products  All fish and seafood.  Dried beans, peas, or lentils. Unsalted nuts and seeds. Unsalted canned beans.  Dairy Low-fat dairy products, such as skim or 1% milk, 2% or reduced-fat cheeses, low-fat ricotta or cottage cheese, or plain low-fat yogurt. Low-sodium or reduced-sodium cheeses.  Fats and Oils Tub margarines without trans fats. Light or reduced-fat mayonnaise and salad dressings (reduced sodium). Avocado. Safflower, olive, or canola oils. Natural peanut or almond butter.  Other Unsalted popcorn and  pretzels. The items listed above may not be a complete list of recommended foods or beverages. Contact your dietitian for more options.  +++++++++++++++  WHAT FOODS ARE NOT RECOMMENDED? Grains/ White flour or wheat flour White bread. White pasta. White rice. Refined cornbread. Bagels and croissants. Crackers that contain trans fat.  Vegetables  Creamed or fried vegetables. Vegetables in a . Regular canned vegetables. Regular canned tomato sauce and paste. Regular tomato and vegetable juices.  Fruits Dried fruits. Canned fruit in light or heavy syrup. Fruit juice.  Meat and Other Protein Products Meat in general - RED meat & White meat.  Fatty cuts of meat. Ribs, chicken wings, all processed meats as bacon, sausage, bologna, salami, fatback, hot dogs, bratwurst and packaged luncheon meats.  Dairy Whole or 2% milk, cream, half-and-half, and cream cheese. Whole-fat or sweetened yogurt. Full-fat cheeses or blue cheese. Non-dairy creamers and whipped toppings. Processed cheese, cheese spreads, or cheese curds.  Condiments Onion and garlic salt, seasoned salt, table salt, and sea salt. Canned and  packaged gravies. Worcestershire sauce. Tartar sauce. Barbecue sauce. Teriyaki sauce. Soy sauce, including reduced sodium. Steak sauce. Fish sauce. Oyster sauce. Cocktail sauce. Horseradish. Ketchup and mustard. Meat flavorings and tenderizers. Bouillon cubes. Hot sauce. Tabasco sauce. Marinades. Taco seasonings. Relishes.  Fats and Oils Butter, stick margarine, lard, shortening and bacon fat. Coconut, palm kernel, or palm oils. Regular salad dressings.  Pickles and olives. Salted popcorn and pretzels.  The items listed above may not be a complete list of foods and beverages to avoid.

## 2019-03-23 ENCOUNTER — Encounter: Payer: Self-pay | Admitting: Hematology and Oncology

## 2019-03-23 ENCOUNTER — Inpatient Hospital Stay: Payer: Medicare Other

## 2019-03-23 ENCOUNTER — Inpatient Hospital Stay: Payer: Medicare Other | Attending: Hematology and Oncology | Admitting: Hematology and Oncology

## 2019-03-23 ENCOUNTER — Other Ambulatory Visit: Payer: Self-pay

## 2019-03-23 DIAGNOSIS — C541 Malignant neoplasm of endometrium: Secondary | ICD-10-CM

## 2019-03-23 DIAGNOSIS — R64 Cachexia: Secondary | ICD-10-CM | POA: Insufficient documentation

## 2019-03-23 DIAGNOSIS — K5909 Other constipation: Secondary | ICD-10-CM | POA: Insufficient documentation

## 2019-03-23 DIAGNOSIS — C7951 Secondary malignant neoplasm of bone: Secondary | ICD-10-CM

## 2019-03-23 DIAGNOSIS — D509 Iron deficiency anemia, unspecified: Secondary | ICD-10-CM | POA: Diagnosis not present

## 2019-03-23 LAB — CMP (CANCER CENTER ONLY)
ALT: 11 U/L (ref 0–44)
AST: 18 U/L (ref 15–41)
Albumin: 3.1 g/dL — ABNORMAL LOW (ref 3.5–5.0)
Alkaline Phosphatase: 59 U/L (ref 38–126)
Anion gap: 14 (ref 5–15)
BUN: 11 mg/dL (ref 8–23)
CO2: 22 mmol/L (ref 22–32)
Calcium: 9.5 mg/dL (ref 8.9–10.3)
Chloride: 102 mmol/L (ref 98–111)
Creatinine: 0.72 mg/dL (ref 0.44–1.00)
GFR, Est AFR Am: >60 mL/min (ref >60)
GFR, Estimated: >60 mL/min (ref >60)
Glucose, Bld: 96 mg/dL (ref 70–99)
Potassium: 3.5 mmol/L (ref 3.5–5.1)
Sodium: 138 mmol/L (ref 135–145)
Total Bilirubin: 0.4 mg/dL (ref 0.3–1.2)
Total Protein: 7.3 g/dL (ref 6.5–8.1)

## 2019-03-23 LAB — CBC WITH DIFFERENTIAL (CANCER CENTER ONLY)
Abs Immature Granulocytes: 0.01 10*3/uL (ref 0.00–0.07)
Basophils Absolute: 0 10*3/uL (ref 0.0–0.1)
Basophils Relative: 0 %
Eosinophils Absolute: 0 10*3/uL (ref 0.0–0.5)
Eosinophils Relative: 1 %
HCT: 27.7 % — ABNORMAL LOW (ref 36.0–46.0)
Hemoglobin: 8.9 g/dL — ABNORMAL LOW (ref 12.0–15.0)
Immature Granulocytes: 0 %
Lymphocytes Relative: 12 %
Lymphs Abs: 0.5 10*3/uL — ABNORMAL LOW (ref 0.7–4.0)
MCH: 24.8 pg — ABNORMAL LOW (ref 26.0–34.0)
MCHC: 32.1 g/dL (ref 30.0–36.0)
MCV: 77.2 fL — ABNORMAL LOW (ref 80.0–100.0)
Monocytes Absolute: 0.4 10*3/uL (ref 0.1–1.0)
Monocytes Relative: 9 %
Neutro Abs: 3.3 10*3/uL (ref 1.7–7.7)
Neutrophils Relative %: 78 %
Platelet Count: 434 10*3/uL — ABNORMAL HIGH (ref 150–400)
RBC: 3.59 MIL/uL — ABNORMAL LOW (ref 3.87–5.11)
RDW: 17.1 % — ABNORMAL HIGH (ref 11.5–15.5)
WBC Count: 4.3 10*3/uL (ref 4.0–10.5)
nRBC: 0 % (ref 0.0–0.2)

## 2019-03-23 IMAGING — US IR FLUORO GUIDE CV LINE*L*
1 series · 1 of 1 positions shown · non-contrast
Comparison: none

CLINICAL DATA: Recurrent metastatic endometrial carcinoma and need
for porta cath for chemotherapy.

[Series 1: ir fluoro/shunt/fist · 1 of 1 slices shown]
[im 1/1]
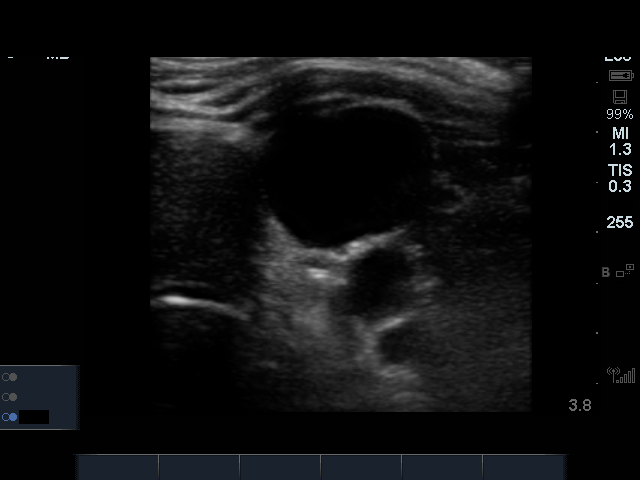

[1 of 1 positions shown; findings below may reference images not displayed]

EXAM:
IMPLANTED PORT A CATH PLACEMENT WITH ULTRASOUND AND FLUOROSCOPIC
GUIDANCE

ANESTHESIA/SEDATION:
2.0 mg IV Versed; 50 mcg IV Fentanyl

Total Moderate Sedation Time:  38 minutes

The patient's level of consciousness and physiologic status were
continuously monitored during the procedure by Radiology nursing.

Additional Medications: 2 g IV Ancef.

FLUOROSCOPY TIME:  18 seconds.  2.3 mGy.

PROCEDURE:
The procedure, risks, benefits, and alternatives were explained to
the patient. Questions regarding the procedure were encouraged and
answered. The patient understands and consents to the procedure. A
time-out was performed prior to initiating the procedure.

Ultrasound was utilized to confirm patency of the right internal
jugular vein. The right neck and chest were prepped with
chlorhexidine in a sterile fashion, and a sterile drape was applied
covering the operative field. Maximum barrier sterile technique with
sterile gowns and gloves were used for the procedure. Local
anesthesia was provided with 1% lidocaine.

After creating a small venotomy incision, a 21 gauge needle was
advanced into the right internal jugular vein under direct,
real-time ultrasound guidance. Ultrasound image documentation was
performed. After securing guidewire access, an 8 Fr dilator was
placed. A J-wire was kinked to measure appropriate catheter length.

A subcutaneous port pocket was then created along the upper chest
wall utilizing sharp and blunt dissection. Portable cautery was
utilized. The pocket was irrigated with sterile saline.

A single lumen power injectable port was chosen for placement. The 8
Fr catheter was tunneled from the port pocket site to the venotomy
incision. The port was placed in the pocket. External catheter was
trimmed to appropriate length based on guidewire measurement.

At the venotomy, an 8 Fr peel-away sheath was placed over a
guidewire. The catheter was then placed through the sheath and the
sheath removed. Final catheter positioning was confirmed and
documented with a fluoroscopic spot image. The port was accessed
with a needle and aspirated and flushed with heparinized saline. The
access needle was removed.

The venotomy and port pocket incisions were closed with subcutaneous
3-0 Monocryl and subcuticular 4-0 Vicryl. Dermabond was applied to
both incisions.

COMPLICATIONS:
COMPLICATIONS
None
FINDINGS: After catheter placement, the tip lies at the Lienad junction.
The catheter aspirates normally and is ready for immediate use.
IMPRESSION: Placement of single lumen port a cath via right internal jugular
vein. The catheter tip lies at the Lienad junction. A power
injectable port a cath was placed and is ready for immediate use.

## 2019-03-23 NOTE — Assessment & Plan Note (Signed)
She tolerated tamoxifen poorly With Megace, she tolerated better but overall, she still feel unwell. I recommend she continues a little longer along with aggressive supportive care

## 2019-03-23 NOTE — Assessment & Plan Note (Signed)
She complained of poor appetite despite being on Megace Her weight has been stable so far I recommend she continues the same

## 2019-03-23 NOTE — Progress Notes (Signed)
[facility] OFFICE PROGRESS NOTE  Patient Care Team: [name], MD as PCP - General (Internal Medicine) [name], MD as Consulting Physician (Gastroenterology) [name], MD as Consulting Physician (Ophthalmology) Cheri Fowler, MD as Consulting Physician (Obstetrics and Gynecology) [name], MD as Consulting Physician (Hematology and Oncology) [name], MD as Consulting Physician (Radiation Oncology)  ASSESSMENT & PLAN:  Endometrial cancer [facility]) She tolerated tamoxifen poorly With Megace, she tolerated better but overall, she still feel unwell. I recommend she continues a little longer along with aggressive supportive care  Iron deficiency anemia She has persistent iron deficiency anemia She is not able to tolerate oral iron supplement The most likely cause of her anemia is due to cchronic blood loss/malabsorption syndrome. We discussed some of the risks, benefits, and alternatives of intravenous iron infusions. The patient is symptomatic from anemia and the iron level is critically low. She tolerated oral iron supplement poorly and desires to achieved higher levels of iron faster for adequate hematopoesis. Some of the side-effects to be expected including risks of infusion reactions, phlebitis, headaches, nausea and fatigue.  The patient is willing to proceed. Patient education material was dispensed.  Goal is to keep ferritin level greater than 50 and resolution of anemia We will try to get her scheduled next week   Malignant cachexia ([place]) She complained of poor appetite despite being on Megace Her weight has been stable so far I recommend she continues the same  Other constipation She had significant intermittent constipation We discussed the importance of aggressive laxative therapy This is another reason why she will not be able to tolerate further oral iron supplement and required IV iron supplement   No orders of the defined  types were placed in this encounter.   INTERVAL HISTORY: Please see below for problem oriented charting. She returns for further follow-up She does not feel well She complained of excessive fatigue and poor appetite She tolerated tamoxifen poorly but is able to tolerate Megace She has some occasional constipation, resolved with laxative She has minimum pain No recent fall Denies abdominal bloating The patient denies any recent signs or symptoms of bleeding such as spontaneous epistaxis, hematuria or hematochezia.   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 SEE COMMENT.  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 The greatest extent of invasive carcinoma as measured on the needle core biopsy measures 0.6 cm.       Endometrial cancer ([address])   02/03/2002 Pathology Results    1. BENIGN ENDOMETRIAL POLYPS.  2. UTERINE FIBROIDS: PORTIONS OF SMOOTH MUSCLE CONSISTENT WITH BENIGN LEIOMYOMA(S). PORTIONS OF BENIGN, CYSTICALLY ATROPHIC ENDOMETRIUM WITHOUT HYPERPLASIA OR EVIDENCE OF MALIGNANCY. 3. ENDOMETRIAL CURETTAGE: BENIGN ENDOMETRIUM AND SMOOTH MUSCLE.    09/07/2016 Pathology Results    PAP smear positive for malignant cells    09/07/2016 Initial Diagnosis    Patient had postmenopausal bleeding in 08-2016. She had PAP 09-07-16 by PCP Dr [name], which documented carcinoma. She had evaluation by Dr [name] with colposcopy/ ECC/endometrial biopsy documenting serous endometrioid endometrial carcinoma.    09/15/2016 Imaging    Ct imaging showed markedly  thickened and heterogeneous endometrial  stripe suspicious for endometrial carcinoma in this patient with postmenopausal vaginal bleeding. Cystic lesion in the right ovary cannot be definitively characterized. Pelvic ultrasound is recommended for further evaluation. Aortoiliac atherosclerosis. Avascular necrosis of the femoral heads bilaterally.    10/07/2016 Tumor Marker    Patient's tumor was tested for the following markers: CA125 Results of the tumor marker test revealed 15.1    10/13/2016 Pathology Results    1. Lymph node, sentinel, biopsy, right obturator - THERE IS NO EVIDENCE OF CARCINOMA IN 8 OF 8 LYMPH NODES (0/8). - SEE COMMENT. 2. Lymph node, sentinel, biopsy, left obturator - THERE IS NO EVIDENCE OF CARCINOMA IN 1 OF 1 LYMPH NODE (0/1). - SEE COMMENT. 3. Lymph node, sentinel, biopsy, left para-aortic - THERE IS NO EVIDENCE OF CARCINOMA IN 4 OF 4 LYMPH NODES (0/4). - SEE COMMENT. 4. Uterus +/- tubes/ovaries, neoplastic, cervix - INVASIVE MIXED SEROUS/ENDOMETRIOID ADENOCARCINOMA, MULTIPLE FOCI, FIGO GRADE III, THE LARGEST FOCUS SPANS 8.2 CM. - ADENOCARCINOMA EXTENDS INTO THE OUTER HALF OF THE MYOMETRIUM AND INVOLVES THE STROMA OF THE LOWER UTERINE SEGMENT AND CERVIX. - LYMPHOVASCULAR INVASION IS IDENTIFIED. - THE SURGICAL RESECTION MARGINS ARE NEGATIVE FOR CARCINOMA. - SEE ONCOLOGY TABLE BELOW. ADDITIONAL FINDINGS: - ENDOMETRIUM: ENDOMETRIOID TYPE POLYP(S), WHICH CONTAIN FOCI OF SIMILAR APPEARING MIXED SEROUS/ENDOMETRIOID ADENOCARCINOMA. - MYOMETRIUM: LEIOMYOMATA. - SEROSA: UNREMARKABLE. - BILATERAL ADNEXA: BENIGN OVARIES AND FALLOPIAN TUBES.    10/13/2016 Surgery    Surgery: Total robotic hysterectomy bilateral salpingo-oophorectomy, bilateral pelvic sentinel lymph node removal, left PA sentinel lymph node removal. Mini-laparotomy for specimen removal Surgeons:  [name], MD; [name], MD  Pathology:  1)Uterus, cervix, bilateral tubes and ovaries 2) Right obturator SLN 3) Left Obturator  SLN 4) Left PA SLN Operative findings: 14 week fibroid uterus with one large dominant 6 cm myoma that was palpably calcified. 4 cm right ovarian cyst. + SLN identified in the bilateral obturator spaces, + SLN identified in the left PA space.     12/01/2016 - 05/31/2017 Chemotherapy    She received carboplatin & Taxol. She had 3 cycles of chemo followed by radiation and then 3 more cycles of chemo    01/11/2017 Genetic Testing    Testing was normal and did not reveal a mutation in these genes: The genes tested were the 80 genes on [facility]'s Multi-Cancer panel (ALK, APC, ATM, AXIN2, BAP1, BARD1, BLM, BMPR1A, BRCA1, BRCA2, BRIP1, CASR, CDC73, CDH1, CDK4, CDKN1B, CDKN1C, CDKN2A, CEBPA, CHEK2, DICER1, DIS3L2, EGFR, EPCAM, FH, FLCN, GATA2, GPC3, GREM1, HOXB13,  HRAS, KIT, MAX, MEN1, MET, MITF, MLH1, MSH2, MSH6, MUTYH, NBN, NF1, NF2, PALB2, PDGFRA, PHOX2B, PMS2, POLD1, POLE, POT1, PRKAR1A, PTCH1, PTEN, RAD50, RAD51C, RAD51D, RB1, RECQL4, RET, RUNX1, SDHA, SDHAF2, SDHB, SDHC, SDHD, SMAD4, SMARCA4, SMARCB1, SMARCE1, STK11, SUFU, TERC, TERT, TMEM127, TP53, TSC1, TSC2, VHL, WRN, and WT1).    02/10/2017 - 04/12/2017 Radiation Therapy    02/10/17-03/16/17; 03/29/17-04/12/17;  1) Pelvis/ 45 Gy in 25 fractions  2) Vaginal Cuff/ 18 Gy in 3 fractions    08/26/2018 Imaging    Large destructive mass lesion left iliac bone with large soft tissue mass extending into the posterior soft tissues and pelvis compatible with metastatic disease. Consider follow-up CT chest abdomen pelvis with contrast for further staging.  Radiation changes at L5 and the sacrum. No lumbar metastatic deposits  Multilevel degenerative changes above.    08/29/2018 Imaging    1. 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 2.  New irregular nodular opacity in the medial right middle lobe, new since 2017 CT. Separate subcentimeter right upper lobe solid pulmonary nodule, for which no comparison exists. These  findings are indeterminate for pulmonary metastases and attention on follow-up chest CT in 3 months is recommended. 3. No lymphadenopathy or additional findings of metastatic disease.No tumor recurrence at the hysterectomy margin. 4. Aortic Atherosclerosis (ICD10-I70.0). Additional chronic findings as detailed.    08/29/2018 Tumor Marker    Patient's tumor was tested for the following markers: CA-125 Results of the tumor marker test revealed 17.4    09/06/2018 Procedure    CT-guided core biopsy performed of huge mass in the left iliac fossa destroying the left iliac bone.    09/06/2018 Pathology Results    Soft Tissue Needle Core Biopsy, Left Iliac Fossa - POORLY DIFFERENTIATED MALIGNANT NEOPLASM. Microscopic Comment The provided clinical history of both endometrial and breast cancer is noted. In the current case, immunohistochemistry for qualitative ER demonstrates scattered positive staining. CK8/18 is focally positive. CKAE1/3, CK7, CK20, TTF-1, CDX-2, GATA3 and PAX 8 are negative. The immunophenotype is not specific as to an origin for this poorly differentiated malignant neoplasm. The morphology is more suggestive of endometrial cancer than breast cancer, and may represent an unrecognized carcinosarcomatous component.    09/08/2018 - 09/27/2018 Radiation Therapy    She had palliative radiation treatment    09/29/2018 Procedure    Placement of single lumen port a cath via right internal jugular vein. The catheter tip lies at the cavo-atrial junction. A power injectable port a cath was placed and is ready for immediate use.    10/04/2018 - 01/17/2019 Chemotherapy    The patient had carboplatin and taxol x 6 cycles    12/05/2018 Tumor Marker    Patient's tumor was tested for the following markers: CA125 Results of the tumor marker test revealed 5.5    12/05/2018 Imaging    1. Considerable cystic degeneration in the large destructive mass of the left iliac bone. New or progressive left  iliac bone pathologic fracture extending into the left SI joint, along with abnormal subluxation at the pubic symphysis indicating pubic symphysis instability. 2. Previous pleural-based nodularity in the right middle lobe is markedly improved, currently 4 mm in thickness and previously 11 mm in thickness. 3. Indistinct new wedge-shaped lesion in the left mid kidney, hypodense on portal venous phase images and hyperdense on delayed phase images, suggesting prolong tubular transit. This is a nonspecific finding but could reflect local inflammation, infection, or less likely infiltrative tumor. 4. Other imaging findings of potential clinical significance: Aortic Atherosclerosis (ICD10-I70.0). Left main coronary artery atherosclerosis. 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    02/15/2019 Imaging    1. Slight progression of large expansile lytic lesion involving the left iliac bone. This demonstrates central low density, suggesting partial necrosis. This lesion was biopsied on 09/06/2018, revealing poorly differentiated malignant neoplasm. 2. New low-density lesion in the dome of the right hepatic lobe worrisome for cystic or treated metastasis. 3. No other evidence of metastatic disease. No explanation for the patient's symptoms. 4. 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     03/03/2019 -  Anti-estrogen oral therapy    She cannot tolerate tamoxifen. She tolerated megace well     Metastasis to bone ([facility])    REVIEW OF SYSTEMS:   Constitutional: Denies fevers, chills or abnormal weight loss Eyes:  Denies blurriness of vision Ears, nose, mouth, throat, and face: Denies mucositis or sore throat Respiratory: Denies cough, dyspnea or wheezes Cardiovascular: Denies palpitation, chest discomfort or lower extremity swelling Skin: Denies abnormal skin  rashes Lymphatics: Denies new lymphadenopathy or easy bruising Neurological:Denies numbness, tingling or new weaknesses Behavioral/Psych: Mood is stable, no new changes  All other systems were reviewed with the patient and are negative.  I have reviewed the past medical history, past surgical history, social history and family history with the patient and they are unchanged from previous note.  ALLERGIES:  is allergic to omnipaque [iohexol]; calan [verapamil]; effexor [venlafaxine]; erythromycin; femara [letrozole]; fosamax [alendronate]; ibuprofen; paxil [paroxetine hcl]; remeron [mirtazapine]; and vasotec [enalapril].  MEDICATIONS:  Current Outpatient Medications  Medication Sig Dispense Refill  . acetaminophen (TYLENOL) 500 MG tablet Take 500 mg by mouth daily as needed for moderate pain or headache.    Marland Kitchen aspirin 81 MG tablet Take 81 mg by mouth daily.      . Calcium Carbonate-Vitamin D (CALCIUM 600 + D PO) Take 1 tablet by mouth 2 (two) times daily.     . Cholecalciferol (VITAMIN D) 2000 units CAPS Take 2,000 Units by mouth every other day.    . furosemide (LASIX) 20 MG tablet Once daily for 3 days, repeat as needed for lower extremity edema 30 tablet 1  . levothyroxine (SYNTHROID, LEVOTHROID) 88 MCG tablet Take 1 tablet (88 mcg total) by mouth daily before breakfast. 90 tablet 1  . lidocaine-prilocaine (EMLA) cream Apply to affected area once 30 g 3  . loratadine (CLARITIN) 10 MG tablet Take 10 mg by mouth daily as needed for allergies.    Marland Kitchen loratadine-pseudoephedrine (CLARITIN-D 24 HOUR) 10-240 MG 24 hr tablet Take 1 tablet by mouth daily as needed for allergies.    . megestrol (MEGACE) 40 MG tablet Take 80 mg twice a day for 3 weeks then alternate with tamoxifen 84 tablet 9  . metFORMIN (GLUCOPHAGE-XR) 500 MG 24 hr tablet Take 1 tablet (500 mg total) by mouth 2 (two) times daily. (Patient taking differently: Take 500 mg by mouth 3 (three) times daily. ) 360 tablet 1  . Multiple Vitamin  (MULTIVITAMIN WITH MINERALS) TABS tablet Take 1 tablet by mouth daily.    . ondansetron (ZOFRAN) 8 MG tablet Take 1 tablet (8 mg total) by mouth every 8 (eight) hours as needed for refractory nausea / vomiting. Start on day 3 after chemo. (Patient not taking: Reported on 03/21/2019) 30 tablet 1  . pravastatin (PRAVACHOL) 40 MG tablet Take 1 tablet (40 mg total) by mouth every evening. 90 tablet 1  . prochlorperazine (COMPAZINE) 10 MG tablet Take 1 tablet (10 mg total) by mouth every 6 (six) hours as needed (Nausea or vomiting). (Patient not taking: Reported on 03/21/2019) 30 tablet 1  . traMADol (ULTRAM) 50 MG tablet Take 1 tablet (50 mg total) by mouth 3 (three) times daily. Take 1/2 to 1 tablet every 4 hours as needed for severe Back Pain 90 tablet 0  . triamterene-hydrochlorothiazide (MAXZIDE-25) 37.5-25 MG tablet Take 1 tablet by mouth daily. Takes in the am     No current facility-administered medications for this visit.    Facility-Administered Medications Ordered in Other Visits  Medication Dose Route Frequency Provider Last Rate Last Dose  . sodium chloride flush (NS) 0.9 % injection 10 mL  10 mL Intracatheter PRN Alvy Bimler, Alexsis Branscom, MD   10 mL at 12/27/18 1825    PHYSICAL EXAMINATION: ECOG PERFORMANCE STATUS: 2 - Symptomatic, <  50% confined to bed  Vitals:   03/23/19 0940  BP: 125/68  Pulse: 92  Resp: 18  Temp: 99.2 F (37.3 C)  SpO2: 100%   Filed Weights   03/23/19 0940  Weight: 145 lb 8 oz (66 kg)    GENERAL:alert, no distress and comfortable SKIN: skin color, texture, turgor are normal, no rashes or significant lesions EYES: normal, Conjunctiva are pink and non-injected, sclera clear OROPHARYNX:no exudate, no erythema and lips, buccal mucosa, and tongue normal  NECK: supple, thyroid normal size, non-tender, without nodularity LYMPH:  no palpable lymphadenopathy in the cervical, axillary or inguinal LUNGS: clear to auscultation and percussion with normal breathing effort HEART:  regular rate & rhythm and no murmurs and no lower extremity edema ABDOMEN:abdomen soft, non-tender and normal bowel sounds Musculoskeletal:no cyanosis of digits and no clubbing  NEURO: alert & oriented x 3 with fluent speech, no focal motor/sensory deficits  LABORATORY DATA:  I have reviewed the data as listed    Component Value Date/Time   NA 138 03/23/2019 0910   NA 137 05/31/2017 0931   K 3.5 03/23/2019 0910   K 3.5 05/31/2017 0931   CL 102 03/23/2019 0910   CL 99 12/20/2012 1336   CO2 22 03/23/2019 0910   CO2 25 05/31/2017 0931   GLUCOSE 96 03/23/2019 0910   GLUCOSE 161 (H) 05/31/2017 0931   GLUCOSE 110 (H) 12/20/2012 1336   BUN 11 03/23/2019 0910   BUN 10.9 05/31/2017 0931   CREATININE 0.72 03/23/2019 0910   CREATININE 0.63 07/05/2018 1009   CREATININE 0.8 05/31/2017 0931   CALCIUM 9.5 03/23/2019 0910   CALCIUM 10.6 (H) 05/31/2017 0931   PROT 7.3 03/23/2019 0910   PROT 7.5 05/31/2017 0931   ALBUMIN 3.1 (L) 03/23/2019 0910   ALBUMIN 4.2 05/31/2017 0931   AST 18 03/23/2019 0910   AST 16 05/31/2017 0931   ALT 11 03/23/2019 0910   ALT 15 05/31/2017 0931   ALKPHOS 59 03/23/2019 0910   ALKPHOS 91 05/31/2017 0931   BILITOT 0.4 03/23/2019 0910   BILITOT 0.33 05/31/2017 0931   GFRNONAA >60 03/23/2019 0910   GFRNONAA 87 07/05/2018 1009   GFRAA >60 03/23/2019 0910   GFRAA 100 07/05/2018 1009    No results found for: SPEP, UPEP  Lab Results  Component Value Date   WBC 4.3 03/23/2019   NEUTROABS 3.3 03/23/2019   HGB 8.9 (L) 03/23/2019   HCT 27.7 (L) 03/23/2019   MCV 77.2 (L) 03/23/2019   PLT 434 (H) 03/23/2019      Chemistry      Component Value Date/Time   NA 138 03/23/2019 0910   NA 137 05/31/2017 0931   K 3.5 03/23/2019 0910   K 3.5 05/31/2017 0931   CL 102 03/23/2019 0910   CL 99 12/20/2012 1336   CO2 22 03/23/2019 0910   CO2 25 05/31/2017 0931   BUN 11 03/23/2019 0910   BUN 10.9 05/31/2017 0931   CREATININE 0.72 03/23/2019 0910   CREATININE 0.63  07/05/2018 1009   CREATININE 0.8 05/31/2017 0931      Component Value Date/Time   CALCIUM 9.5 03/23/2019 0910   CALCIUM 10.6 (H) 05/31/2017 0931   ALKPHOS 59 03/23/2019 0910   ALKPHOS 91 05/31/2017 0931   AST 18 03/23/2019 0910   AST 16 05/31/2017 0931   ALT 11 03/23/2019 0910   ALT 15 05/31/2017 0931   BILITOT 0.4 03/23/2019 0910   BILITOT 0.33 05/31/2017 0931       All  questions were answered. The patient knows to call the clinic with any problems, questions or concerns. No barriers to learning was detected.  I spent 25 minutes counseling the patient face to face. The total time spent in the appointment was 30 minutes and more than 50% was on counseling and review of test results  [name], MD 03/23/2019 2:14 PM

## 2019-03-23 NOTE — Assessment & Plan Note (Signed)
She had significant intermittent constipation We discussed the importance of aggressive laxative therapy This is another reason why she will not be able to tolerate further oral iron supplement and required IV iron supplement

## 2019-03-23 NOTE — Assessment & Plan Note (Signed)
She has persistent iron deficiency anemia She is not able to tolerate oral iron supplement The most likely cause of her anemia is due to cchronic blood loss/malabsorption syndrome. We discussed some of the risks, benefits, and alternatives of intravenous iron infusions. The patient is symptomatic from anemia and the iron level is critically low. She tolerated oral iron supplement poorly and desires to achieved higher levels of iron faster for adequate hematopoesis. Some of the side-effects to be expected including risks of infusion reactions, phlebitis, headaches, nausea and fatigue.  The patient is willing to proceed. Patient education material was dispensed.  Goal is to keep ferritin level greater than 50 and resolution of anemia We will try to get her scheduled next week

## 2019-03-24 ENCOUNTER — Telehealth: Payer: Self-pay | Admitting: Hematology and Oncology

## 2019-03-24 LAB — CA 125: Cancer Antigen (CA) 125: 3.8 U/mL (ref 0.0–38.1)

## 2019-03-24 NOTE — Telephone Encounter (Signed)
Called regarding schedule °

## 2019-03-28 ENCOUNTER — Telehealth: Payer: Self-pay | Admitting: Oncology

## 2019-03-28 IMAGING — DX DG KNEE COMPLETE 4+V*R*
5 series · 5 of 5 positions shown · non-contrast
Comparison: None.

CLINICAL DATA: Fell today onto right knee while undergoing
chemotherapy, endometrial carcinoma, remote breast carcinoma

EXAM:
RIGHT KNEE - COMPLETE 4+ VIEW

[knee ap (1 of 3)]
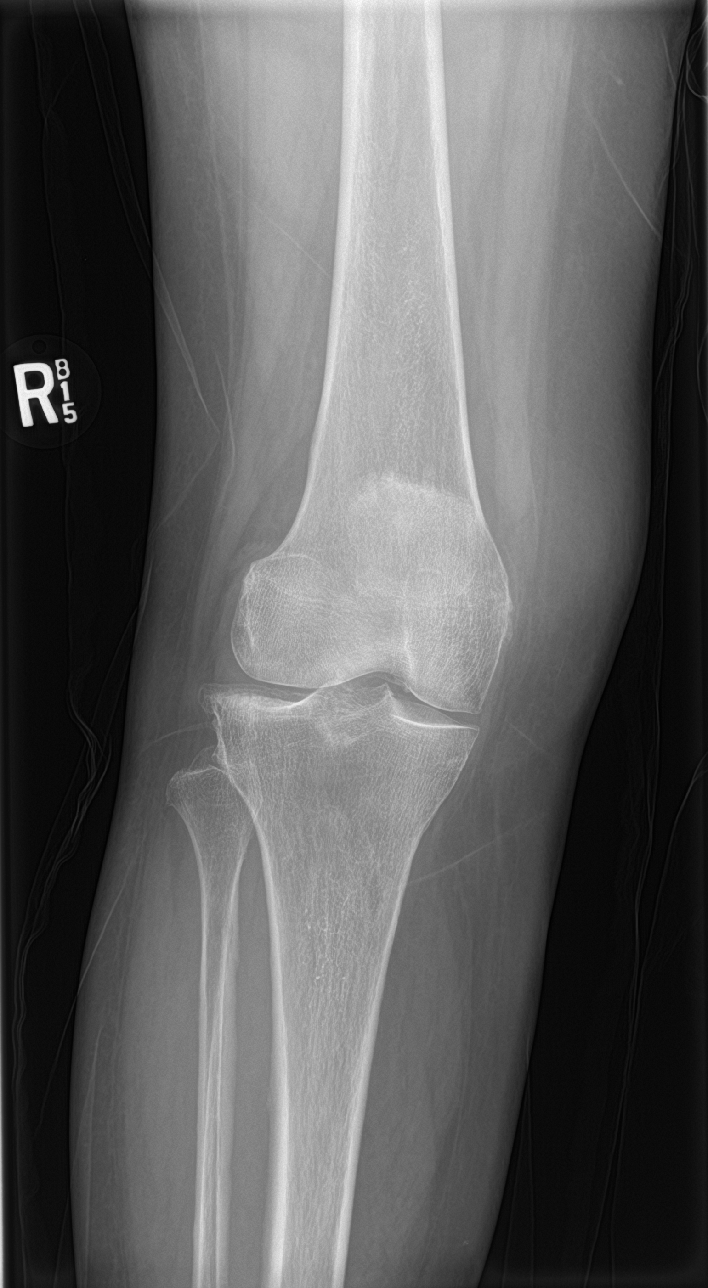

[knee lat]
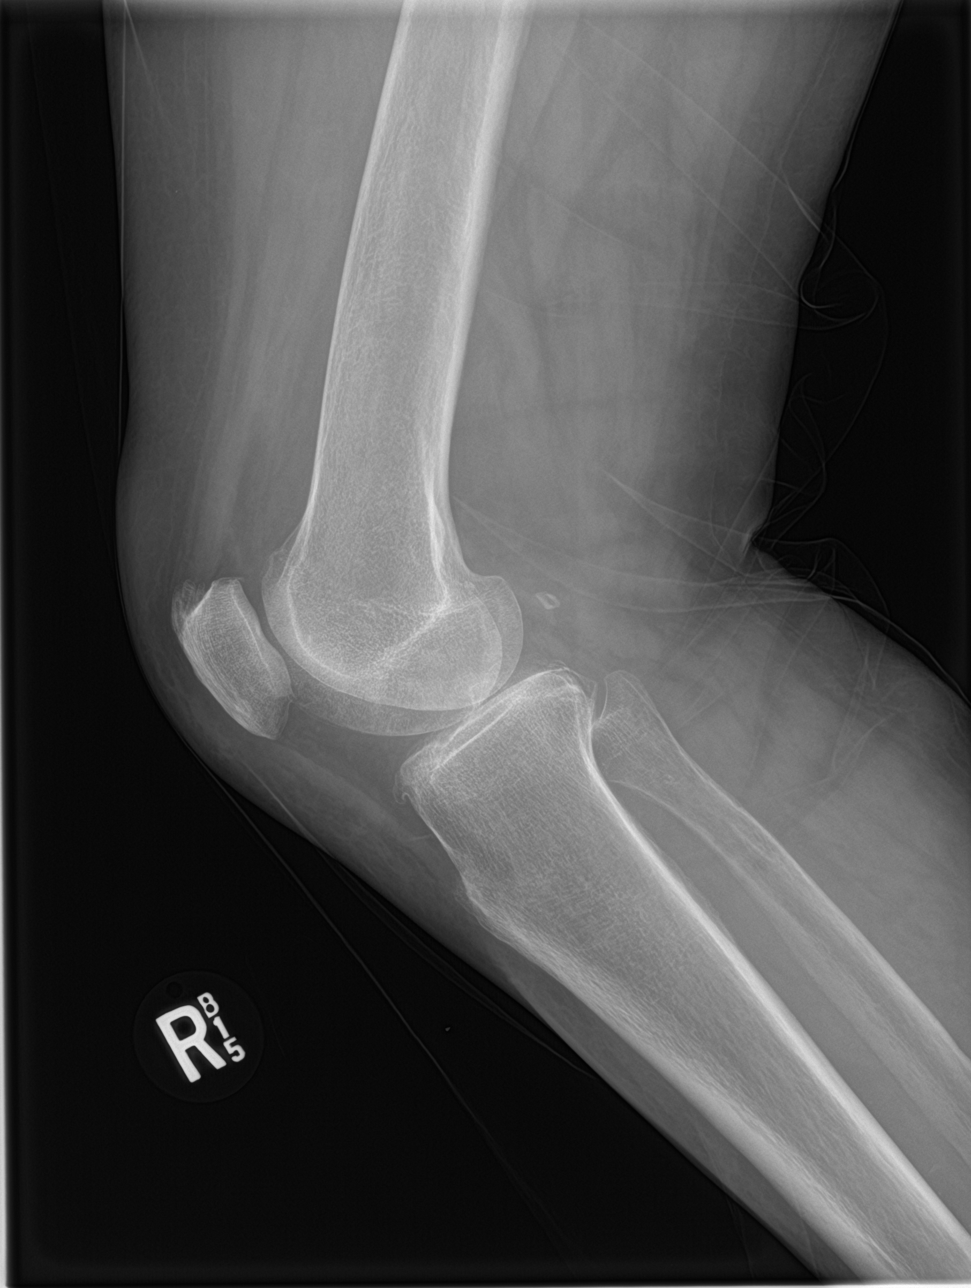

[knee sunrise]
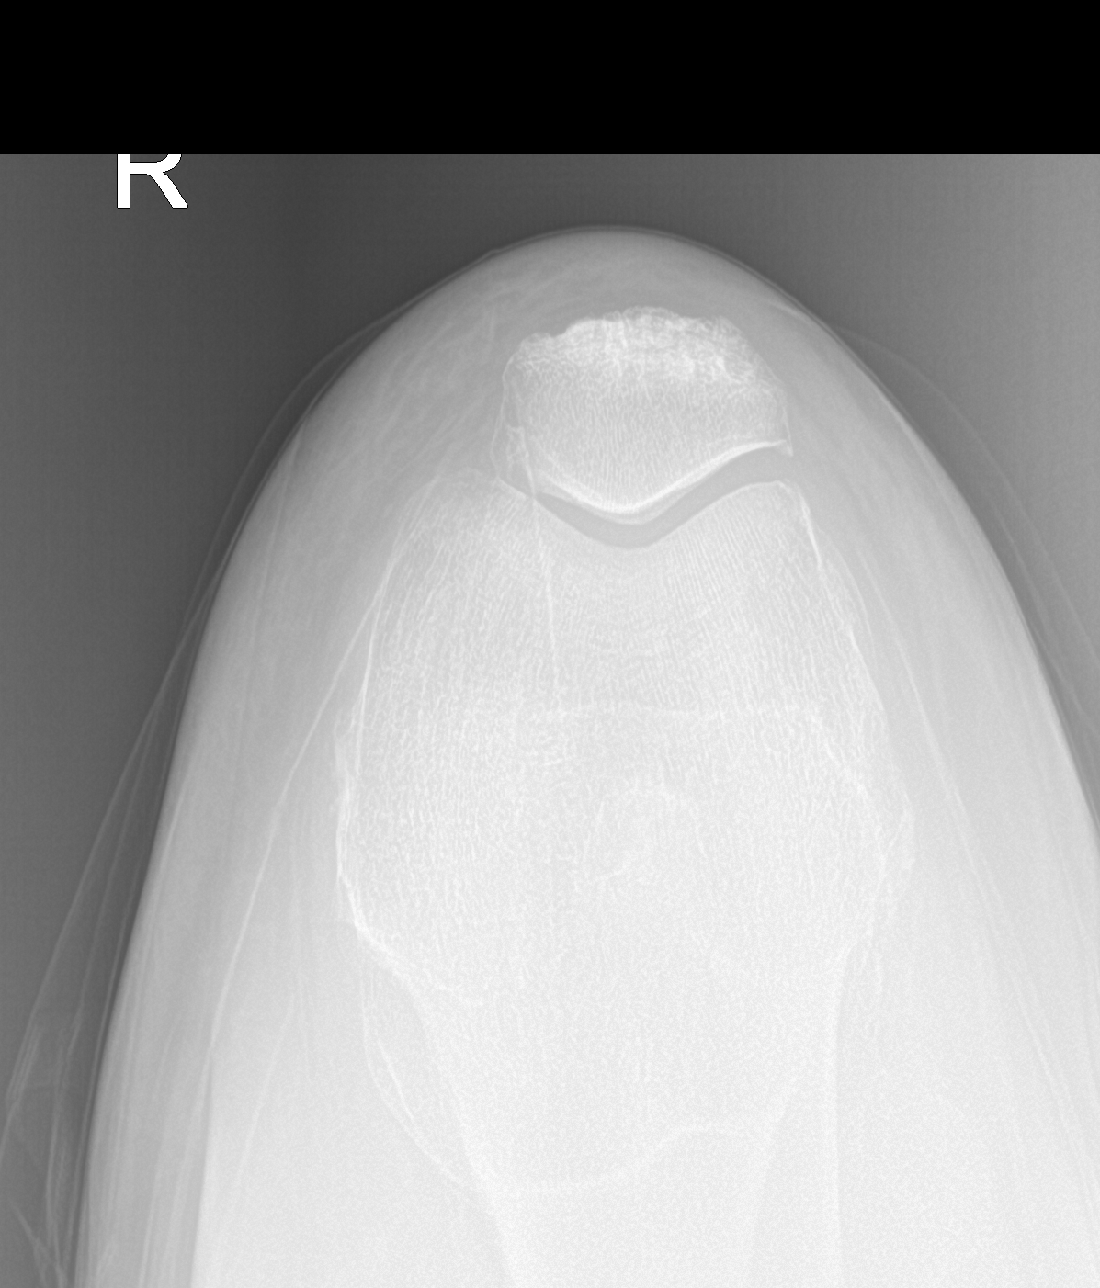

[knee ap (2 of 3)]
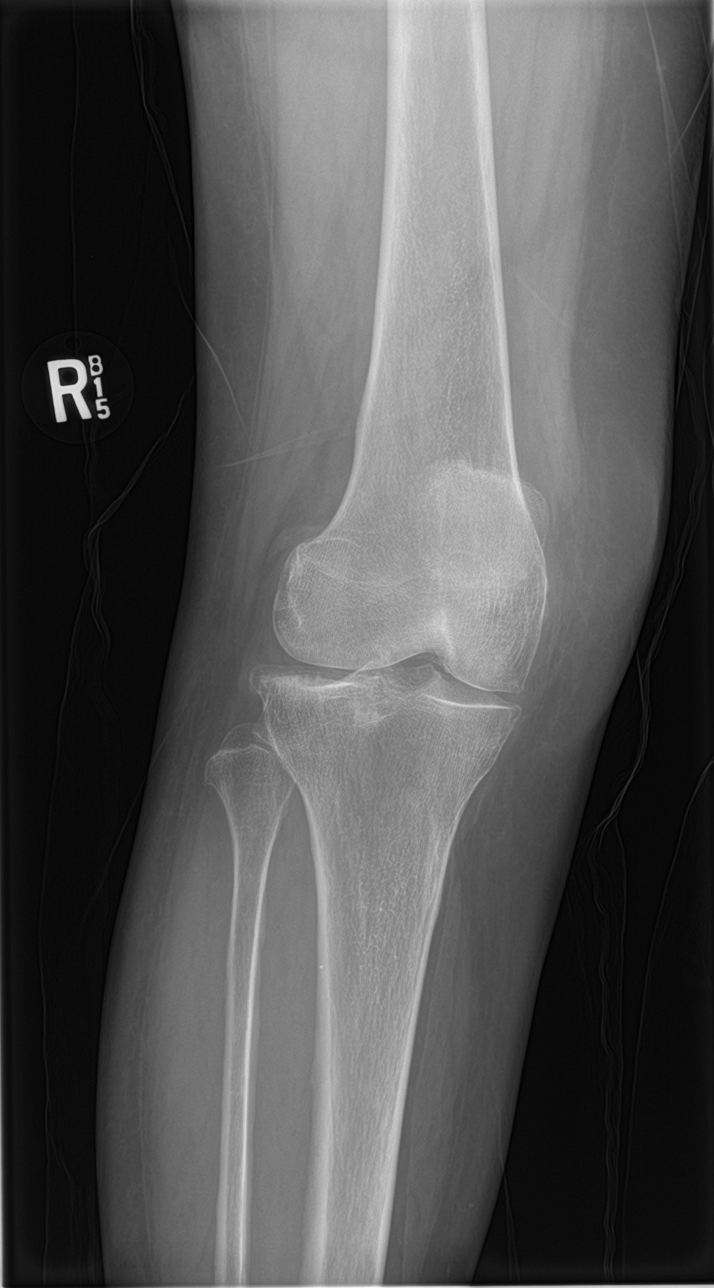

[knee ap (3 of 3)]
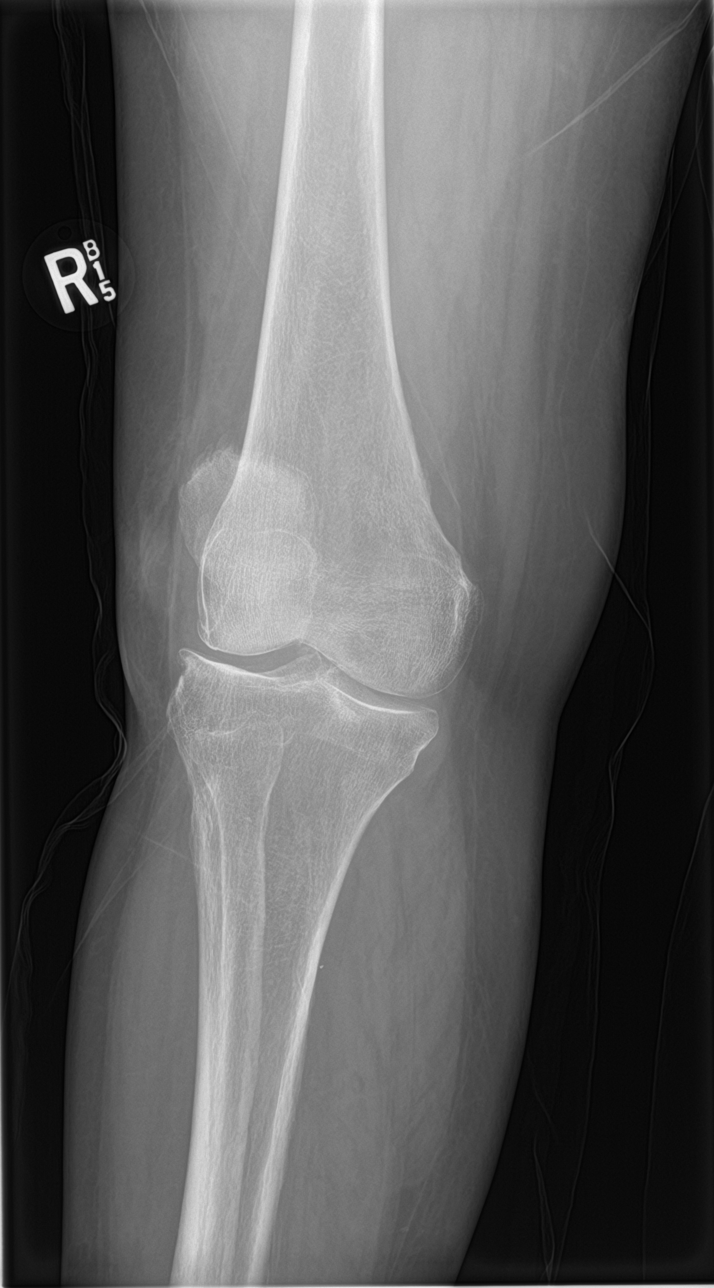

[5 of 5 positions shown; findings below may reference images not displayed]

FINDINGS: For age there is only mild tricompartmental degenerative joint
disease the right knee with some sclerosis and spurring primarily
involving the lateral compartment. No fracture is seen and no joint
effusion is noted.
IMPRESSION: No acute abnormality. Mild tricompartmental degenerative joint
disease of the knee for age.

## 2019-03-28 NOTE — Telephone Encounter (Signed)
Tammy Hensley called and wanted to make sure her port will be used for her infusion on Friday. Advised her that it will be accessed.

## 2019-03-31 ENCOUNTER — Inpatient Hospital Stay: Payer: Medicare Other | Attending: Hematology and Oncology

## 2019-03-31 ENCOUNTER — Other Ambulatory Visit: Payer: Self-pay

## 2019-03-31 VITALS — BP 128/69 | HR 84 | Temp 98.8°F | Resp 16

## 2019-03-31 DIAGNOSIS — C7951 Secondary malignant neoplasm of bone: Secondary | ICD-10-CM | POA: Diagnosis not present

## 2019-03-31 DIAGNOSIS — Z79899 Other long term (current) drug therapy: Secondary | ICD-10-CM | POA: Diagnosis not present

## 2019-03-31 DIAGNOSIS — C541 Malignant neoplasm of endometrium: Secondary | ICD-10-CM | POA: Insufficient documentation

## 2019-03-31 DIAGNOSIS — R630 Anorexia: Secondary | ICD-10-CM | POA: Insufficient documentation

## 2019-03-31 DIAGNOSIS — R350 Frequency of micturition: Secondary | ICD-10-CM | POA: Insufficient documentation

## 2019-03-31 DIAGNOSIS — R64 Cachexia: Secondary | ICD-10-CM | POA: Diagnosis not present

## 2019-03-31 DIAGNOSIS — D649 Anemia, unspecified: Secondary | ICD-10-CM | POA: Insufficient documentation

## 2019-03-31 DIAGNOSIS — G893 Neoplasm related pain (acute) (chronic): Secondary | ICD-10-CM | POA: Diagnosis not present

## 2019-03-31 DIAGNOSIS — C7801 Secondary malignant neoplasm of right lung: Secondary | ICD-10-CM

## 2019-03-31 DIAGNOSIS — C787 Secondary malignant neoplasm of liver and intrahepatic bile duct: Secondary | ICD-10-CM | POA: Insufficient documentation

## 2019-03-31 DIAGNOSIS — R634 Abnormal weight loss: Secondary | ICD-10-CM | POA: Insufficient documentation

## 2019-03-31 MED ORDER — SODIUM CHLORIDE 0.9% FLUSH
10.0000 mL | Freq: Once | INTRAVENOUS | Status: AC
  Administered 2019-03-31: 10 mL
  Filled 2019-03-31: qty 10

## 2019-03-31 MED ORDER — HEPARIN SOD (PORK) LOCK FLUSH 100 UNIT/ML IV SOLN
500.0000 [IU] | Freq: Once | INTRAVENOUS | Status: AC
  Administered 2019-03-31: 500 [IU]
  Filled 2019-03-31: qty 5

## 2019-03-31 MED ORDER — SODIUM CHLORIDE 0.9 % IV SOLN
Freq: Once | INTRAVENOUS | Status: AC
  Administered 2019-03-31: 10:00:00 via INTRAVENOUS
  Filled 2019-03-31: qty 250

## 2019-03-31 MED ORDER — SODIUM CHLORIDE 0.9 % IV SOLN
510.0000 mg | Freq: Once | INTRAVENOUS | Status: AC
  Administered 2019-03-31: 510 mg via INTRAVENOUS
  Filled 2019-03-31: qty 17

## 2019-03-31 NOTE — Patient Instructions (Signed)

## 2019-04-04 ENCOUNTER — Telehealth: Payer: Self-pay | Admitting: Oncology

## 2019-04-04 NOTE — Telephone Encounter (Signed)
Kadiatou called and said she is feeling very tired.  She thinks it is from urinary incontinence.  She said she is getting up constantly to go to the bathroom and is wondering if there is anything that can be done.  She denies any dysuria/hematuria.   She also mentioned that her appetite has improved and she is able to eat now.

## 2019-04-05 ENCOUNTER — Other Ambulatory Visit: Payer: Self-pay

## 2019-04-05 ENCOUNTER — Other Ambulatory Visit: Payer: Self-pay | Admitting: Hematology and Oncology

## 2019-04-05 ENCOUNTER — Inpatient Hospital Stay: Payer: Medicare Other

## 2019-04-05 DIAGNOSIS — E039 Hypothyroidism, unspecified: Secondary | ICD-10-CM

## 2019-04-05 DIAGNOSIS — D509 Iron deficiency anemia, unspecified: Secondary | ICD-10-CM

## 2019-04-05 DIAGNOSIS — C541 Malignant neoplasm of endometrium: Secondary | ICD-10-CM | POA: Diagnosis not present

## 2019-04-05 DIAGNOSIS — C7801 Secondary malignant neoplasm of right lung: Secondary | ICD-10-CM

## 2019-04-05 DIAGNOSIS — C7951 Secondary malignant neoplasm of bone: Secondary | ICD-10-CM

## 2019-04-05 LAB — CMP (CANCER CENTER ONLY)
ALT: 13 U/L (ref 0–44)
AST: 20 U/L (ref 15–41)
Albumin: 3 g/dL — ABNORMAL LOW (ref 3.5–5.0)
Alkaline Phosphatase: 75 U/L (ref 38–126)
Anion gap: 10 (ref 5–15)
BUN: 13 mg/dL (ref 8–23)
CO2: 27 mmol/L (ref 22–32)
Calcium: 9.8 mg/dL (ref 8.9–10.3)
Chloride: 102 mmol/L (ref 98–111)
Creatinine: 0.73 mg/dL (ref 0.44–1.00)
GFR, Est AFR Am: >60 mL/min (ref >60)
GFR, Estimated: >60 mL/min (ref >60)
Glucose, Bld: 94 mg/dL (ref 70–99)
Potassium: 3.5 mmol/L (ref 3.5–5.1)
Sodium: 139 mmol/L (ref 135–145)
Total Bilirubin: 0.3 mg/dL (ref 0.3–1.2)
Total Protein: 7.3 g/dL (ref 6.5–8.1)

## 2019-04-05 LAB — CBC WITH DIFFERENTIAL (CANCER CENTER ONLY)
Abs Immature Granulocytes: 0.02 10*3/uL (ref 0.00–0.07)
Basophils Absolute: 0 10*3/uL (ref 0.0–0.1)
Basophils Relative: 0 %
Eosinophils Absolute: 0 10*3/uL (ref 0.0–0.5)
Eosinophils Relative: 0 %
HCT: 29.2 % — ABNORMAL LOW (ref 36.0–46.0)
Hemoglobin: 9 g/dL — ABNORMAL LOW (ref 12.0–15.0)
Immature Granulocytes: 0 %
Lymphocytes Relative: 9 %
Lymphs Abs: 0.6 10*3/uL — ABNORMAL LOW (ref 0.7–4.0)
MCH: 24.1 pg — ABNORMAL LOW (ref 26.0–34.0)
MCHC: 30.8 g/dL (ref 30.0–36.0)
MCV: 78.1 fL — ABNORMAL LOW (ref 80.0–100.0)
Monocytes Absolute: 0.5 10*3/uL (ref 0.1–1.0)
Monocytes Relative: 8 %
Neutro Abs: 4.9 10*3/uL (ref 1.7–7.7)
Neutrophils Relative %: 83 %
Platelet Count: 479 10*3/uL — ABNORMAL HIGH (ref 150–400)
RBC: 3.74 MIL/uL — ABNORMAL LOW (ref 3.87–5.11)
RDW: 18.4 % — ABNORMAL HIGH (ref 11.5–15.5)
WBC Count: 6 10*3/uL (ref 4.0–10.5)
nRBC: 0 % (ref 0.0–0.2)

## 2019-04-05 LAB — URINALYSIS, COMPLETE (UACMP) WITH MICROSCOPIC
Bacteria, UA: NONE SEEN
Bilirubin Urine: NEGATIVE
Glucose, UA: NEGATIVE mg/dL
Hgb urine dipstick: NEGATIVE
Ketones, ur: NEGATIVE mg/dL
Leukocytes,Ua: NEGATIVE
Nitrite: NEGATIVE
Protein, ur: NEGATIVE mg/dL
Specific Gravity, Urine: 1.016 (ref 1.005–1.030)
pH: 6 (ref 5.0–8.0)

## 2019-04-05 LAB — IRON AND TIBC
Iron: 102 ug/dL (ref 41–142)
Saturation Ratios: 48 % (ref 21–57)
TIBC: 214 ug/dL — ABNORMAL LOW (ref 236–444)
UIBC: 112 ug/dL — ABNORMAL LOW (ref 120–384)

## 2019-04-05 LAB — TSH: TSH: 1.286 u[IU]/mL (ref 0.308–3.960)

## 2019-04-05 LAB — FERRITIN: Ferritin: 2254 ng/mL — ABNORMAL HIGH (ref 11–307)

## 2019-04-05 LAB — T4, FREE: Free T4: 1.01 ng/dL (ref 0.82–1.77)

## 2019-04-05 LAB — VITAMIN B12: Vitamin B-12: 418 pg/mL (ref 180–914)

## 2019-04-05 LAB — SEDIMENTATION RATE: Sed Rate: 115 mm/hr — ABNORMAL HIGH (ref 0–22)

## 2019-04-05 MED ORDER — SODIUM CHLORIDE 0.9% FLUSH
10.0000 mL | Freq: Once | INTRAVENOUS | Status: AC
  Administered 2019-04-05: 10 mL
  Filled 2019-04-05: qty 10

## 2019-04-05 MED ORDER — HEPARIN SOD (PORK) LOCK FLUSH 100 UNIT/ML IV SOLN
500.0000 [IU] | Freq: Once | INTRAVENOUS | Status: AC
  Administered 2019-04-05: 12:00:00 500 [IU]
  Filled 2019-04-05: qty 5

## 2019-04-05 NOTE — Telephone Encounter (Signed)
Called Tammy Hensley and let her know that labs have been canceled for Friday, 04/07/19 because she had them done today.  She will need to be here at 9:15 for Dr. Calton Dach appointment.  She verbalized understanding and agreement.

## 2019-04-05 NOTE — Telephone Encounter (Signed)
If she is willing she can come in for labs and urine test today and keep her appt to see me on Friday Urine culture takes 48 hours anyway I will order additional thyroid test as well

## 2019-04-05 NOTE — Telephone Encounter (Signed)
Called Tammy Hensley and advised her of Dr. Calton Dach message.  She is able to come in at 48 today for labs.  Scheduling message sent.

## 2019-04-06 LAB — CA 125: Cancer Antigen (CA) 125: 4.2 U/mL (ref 0.0–38.1)

## 2019-04-06 LAB — URINE CULTURE: Culture: NO GROWTH

## 2019-04-07 ENCOUNTER — Inpatient Hospital Stay: Payer: Medicare Other

## 2019-04-07 ENCOUNTER — Inpatient Hospital Stay (HOSPITAL_BASED_OUTPATIENT_CLINIC_OR_DEPARTMENT_OTHER): Payer: Medicare Other | Admitting: Hematology and Oncology

## 2019-04-07 ENCOUNTER — Telehealth: Payer: Self-pay

## 2019-04-07 ENCOUNTER — Other Ambulatory Visit: Payer: Self-pay

## 2019-04-07 ENCOUNTER — Encounter: Payer: Self-pay | Admitting: Hematology and Oncology

## 2019-04-07 VITALS — BP 148/62 | HR 100 | Temp 98.7°F | Resp 18 | Ht 62.0 in | Wt 142.6 lb

## 2019-04-07 DIAGNOSIS — C541 Malignant neoplasm of endometrium: Secondary | ICD-10-CM

## 2019-04-07 DIAGNOSIS — R634 Abnormal weight loss: Secondary | ICD-10-CM

## 2019-04-07 DIAGNOSIS — R630 Anorexia: Secondary | ICD-10-CM

## 2019-04-07 DIAGNOSIS — G893 Neoplasm related pain (acute) (chronic): Secondary | ICD-10-CM | POA: Diagnosis not present

## 2019-04-07 DIAGNOSIS — C7951 Secondary malignant neoplasm of bone: Secondary | ICD-10-CM | POA: Diagnosis not present

## 2019-04-07 DIAGNOSIS — D638 Anemia in other chronic diseases classified elsewhere: Secondary | ICD-10-CM | POA: Insufficient documentation

## 2019-04-07 DIAGNOSIS — R64 Cachexia: Secondary | ICD-10-CM

## 2019-04-07 DIAGNOSIS — D649 Anemia, unspecified: Secondary | ICD-10-CM | POA: Diagnosis not present

## 2019-04-07 DIAGNOSIS — R350 Frequency of micturition: Secondary | ICD-10-CM

## 2019-04-07 MED ORDER — DEXAMETHASONE 4 MG PO TABS: 4.0000 mg | ORAL_TABLET | Freq: Every day | ORAL | 0 refills | Status: DC

## 2019-04-07 NOTE — Assessment & Plan Note (Signed)
She has progressive weight loss due to poor appetite I recommend discontinuation of Megace I am going to put her back on daily dexamethasone and reassess next week

## 2019-04-07 NOTE — Telephone Encounter (Signed)
I will send scheduling msg to see her the following week since I am not here on Friday next week, FYI

## 2019-04-07 NOTE — Progress Notes (Signed)
[facility] OFFICE PROGRESS NOTE  Patient Care Team: [name], MD as PCP - General (Internal Medicine) [name], MD as Consulting Physician (Gastroenterology) [name], MD as Consulting Physician (Ophthalmology) [name], [name], MD as Consulting Physician (Obstetrics and Gynecology) [name], MD as Consulting Physician (Hematology and Oncology) [name], MD as Consulting Physician (Radiation Oncology)  ASSESSMENT & PLAN:  Endometrial cancer [facility]) Unfortunately, she has progressive functional decline She also have new right flank discomfort I am concerned about disease progression I recommend discontinuation of Megace I recommend CT imaging next week for objective assessment of response to therapy and for further evaluation She agreed with the plan of care  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 has intermittent right flank pain Urinalysis and urine culture were negative I am concerned about disease progression She will continue tramadol for now I will order CT imaging as above  Malignant cachexia ([place]) She has progressive weight loss due to poor appetite I recommend discontinuation of Megace I am going to put her back on daily dexamethasone and reassess next week  Anemia, chronic disease The pattern of anemia is not consistent with iron deficiency, more like anemia chronic illness likely secondary to active disease She does not need iron infusion or blood transfusion   Orders Placed This Encounter  Procedures  . CT ABDOMEN PELVIS W CONTRAST    Standing Status:   Future    Standing Expiration Date:   04/06/2020    Order Specific Question:   If indicated for the ordered procedure, I authorize the administration of contrast media per Radiology protocol    Answer:   Yes    Order Specific Question:   Preferred imaging location?    Answer:   [facility]    Order Specific Question:   Radiology Contrast Protocol - do NOT remove file path    Answer:   [email or web address]    INTERVAL HISTORY: Please see below for problem oriented charting. 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 SEE COMMENT.  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 The greatest extent of invasive carcinoma as measured on the needle core biopsy measures 0.6 cm.       Endometrial cancer ([place])   02/03/2002 Pathology Results    1. BENIGN ENDOMETRIAL POLYPS.  2. UTERINE FIBROIDS: PORTIONS OF SMOOTH MUSCLE CONSISTENT WITH BENIGN LEIOMYOMA(S). PORTIONS OF BENIGN, CYSTICALLY ATROPHIC ENDOMETRIUM WITHOUT HYPERPLASIA OR EVIDENCE OF MALIGNANCY. 3. ENDOMETRIAL CURETTAGE: BENIGN ENDOMETRIUM AND SMOOTH MUSCLE.    09/07/2016 Pathology Results    PAP smear positive for malignant cells    09/07/2016 Initial Diagnosis    Patient had postmenopausal bleeding in 08-2016. She had PAP 09-07-16 by PCP Dr [name], which documented carcinoma. She had evaluation by Dr  [name] with colposcopy/ ECC/endometrial biopsy documenting serous endometrioid endometrial carcinoma.    09/15/2016 Imaging    Ct imaging showed markedly thickened and heterogeneous endometrial stripe  suspicious for endometrial carcinoma in this patient with postmenopausal vaginal bleeding. Cystic lesion in the right ovary cannot be definitively characterized. Pelvic ultrasound is recommended for further evaluation. Aortoiliac atherosclerosis. Avascular necrosis of the femoral heads bilaterally.    10/07/2016 Tumor Marker    Patient's tumor was tested for the following markers: CA125 Results of the tumor marker test revealed 15.1    10/13/2016 Pathology Results    1. Lymph node, sentinel, biopsy, right obturator - THERE IS NO EVIDENCE OF CARCINOMA IN 8 OF 8 LYMPH NODES (0/8). - SEE COMMENT. 2. Lymph node, sentinel, biopsy, left obturator - THERE IS NO EVIDENCE OF CARCINOMA IN 1 OF 1 LYMPH NODE (0/1). - SEE COMMENT. 3. Lymph node, sentinel, biopsy, left para-aortic - THERE IS NO EVIDENCE OF CARCINOMA IN 4 OF 4 LYMPH NODES (0/4). - SEE COMMENT. 4. Uterus +/- tubes/ovaries, neoplastic, cervix - INVASIVE MIXED SEROUS/ENDOMETRIOID ADENOCARCINOMA, MULTIPLE FOCI, FIGO GRADE III, THE LARGEST FOCUS SPANS 8.2 CM. - ADENOCARCINOMA EXTENDS INTO THE OUTER HALF OF THE MYOMETRIUM AND INVOLVES THE STROMA OF THE LOWER UTERINE SEGMENT AND CERVIX. - LYMPHOVASCULAR INVASION IS IDENTIFIED. - THE SURGICAL RESECTION MARGINS ARE NEGATIVE FOR CARCINOMA. - SEE ONCOLOGY TABLE BELOW. ADDITIONAL FINDINGS: - ENDOMETRIUM: ENDOMETRIOID TYPE POLYP(S), WHICH CONTAIN FOCI OF SIMILAR APPEARING MIXED SEROUS/ENDOMETRIOID ADENOCARCINOMA. - MYOMETRIUM: LEIOMYOMATA. - SEROSA: UNREMARKABLE. - BILATERAL ADNEXA: BENIGN OVARIES AND FALLOPIAN TUBES.    10/13/2016 Surgery    Surgery: Total robotic hysterectomy bilateral salpingo-oophorectomy, bilateral pelvic sentinel lymph node removal, left PA sentinel lymph node removal. Mini-laparotomy for specimen removal Surgeons:  [name], MD; [name], MD  Pathology:  1)Uterus, cervix, bilateral tubes and ovaries 2) Right obturator SLN 3) Left Obturator SLN 4)  Left PA SLN Operative findings: 14 week fibroid uterus with one large dominant 6 cm myoma that was palpably calcified. 4 cm right ovarian cyst. + SLN identified in the bilateral obturator spaces, + SLN identified in the left PA space.     12/01/2016 - 05/31/2017 Chemotherapy    She received carboplatin & Taxol. She had 3 cycles of chemo followed by radiation and then 3 more cycles of chemo    01/11/2017 Genetic Testing    Testing was normal and did not reveal a mutation in these genes: The genes tested were the 80 genes on [employer]'s Multi-Cancer panel (ALK, APC, ATM, AXIN2, BAP1, BARD1, BLM, BMPR1A, BRCA1, BRCA2, BRIP1, CASR, CDC73, CDH1, CDK4, CDKN1B, CDKN1C, CDKN2A, CEBPA, CHEK2, DICER1, DIS3L2, EGFR, EPCAM, FH, FLCN, GATA2, GPC3, GREM1, HOXB13,  HRAS, KIT, MAX, MEN1, MET, MITF, MLH1, MSH2, MSH6, MUTYH, NBN, NF1, NF2, PALB2, PDGFRA, PHOX2B, PMS2, POLD1, POLE, POT1, PRKAR1A, PTCH1, PTEN, RAD50, RAD51C, RAD51D, RB1, RECQL4, RET, RUNX1, SDHA, SDHAF2, SDHB, SDHC, SDHD, SMAD4, SMARCA4, SMARCB1, SMARCE1, STK11, SUFU, TERC, TERT, TMEM127, TP53, TSC1, TSC2, VHL, WRN, and WT1).    02/10/2017 - 04/12/2017 Radiation Therapy    02/10/17-03/16/17; 03/29/17-04/12/17;  1) Pelvis/ 45 Gy in 25 fractions  2) Vaginal Cuff/ 18 Gy in 3 fractions    08/26/2018 Imaging    Large destructive mass lesion left iliac bone with large soft tissue mass extending into the posterior soft tissues and pelvis compatible with metastatic disease. Consider follow-up CT chest abdomen pelvis with contrast for further staging.  Radiation changes at L5 and the sacrum. No lumbar metastatic deposits  Multilevel degenerative changes above.  08/29/2018 Imaging    1. 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 2. New irregular nodular opacity in the medial right middle lobe, new since 2017 CT. Separate subcentimeter right upper lobe solid pulmonary nodule, for which no comparison exists. These  findings are indeterminate for pulmonary metastases and attention on follow-up chest CT in 3 months is recommended. 3. No lymphadenopathy or additional findings of metastatic disease.No tumor recurrence at the hysterectomy margin. 4. Aortic Atherosclerosis (ICD10-I70.0). Additional chronic findings as detailed.    08/29/2018 Tumor Marker    Patient's tumor was tested for the following markers: CA-125 Results of the tumor marker test revealed 17.4    09/06/2018 Procedure    CT-guided core biopsy performed of huge mass in the left iliac fossa destroying the left iliac bone.    09/06/2018 Pathology Results    Soft Tissue Needle Core Biopsy, Left Iliac Fossa - POORLY DIFFERENTIATED MALIGNANT NEOPLASM. Microscopic Comment The provided clinical history of both endometrial and breast cancer is noted. In the current case, immunohistochemistry for qualitative ER demonstrates scattered positive staining. CK8/18 is focally positive. CKAE1/3, CK7, CK20, TTF-1, CDX-2, GATA3 and PAX 8 are negative. The immunophenotype is not specific as to an origin for this poorly differentiated malignant neoplasm. The morphology is more suggestive of endometrial cancer than breast cancer, and may represent an unrecognized carcinosarcomatous component.    09/08/2018 - 09/27/2018 Radiation Therapy    She had palliative radiation treatment    09/29/2018 Procedure    Placement of single lumen port a cath via right internal jugular vein. The catheter tip lies at the cavo-atrial junction. A power injectable port a cath was placed and is ready for immediate use.    10/04/2018 - 01/17/2019 Chemotherapy    The patient had carboplatin and taxol x 6 cycles    12/05/2018 Tumor Marker    Patient's tumor was tested for the following markers: CA125 Results of the tumor marker test revealed 5.5    12/05/2018 Imaging    1. Considerable cystic degeneration in the large destructive mass of the left iliac bone. New or progressive left  iliac bone pathologic fracture extending into the left SI joint, along with abnormal subluxation at the pubic symphysis indicating pubic symphysis instability. 2. Previous pleural-based nodularity in the right middle lobe is markedly improved, currently 4 mm in thickness and previously 11 mm in thickness. 3. Indistinct new wedge-shaped lesion in the left mid kidney, hypodense on portal venous phase images and hyperdense on delayed phase images, suggesting prolong tubular transit. This is a nonspecific finding but could reflect local inflammation, infection, or less likely infiltrative tumor. 4. Other imaging findings of potential clinical significance: Aortic Atherosclerosis (ICD10-I70.0). Left main coronary artery atherosclerosis. 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    02/15/2019 Imaging    1. Slight progression of large expansile lytic lesion involving the left iliac bone. This demonstrates central low density, suggesting partial necrosis. This lesion was biopsied on 09/06/2018, revealing poorly differentiated malignant neoplasm. 2. New low-density lesion in the dome of the right hepatic lobe worrisome for cystic or treated metastasis. 3. No other evidence of metastatic disease. No explanation for the patient's symptoms. 4. 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  I have reviewed the past medical history, past surgical history, social history and family history with the patient and they are unchanged from previous note.  ALLERGIES:  is allergic to omnipaque [iohexol]; calan [verapamil]; effexor [venlafaxine]; erythromycin; femara [letrozole]; fosamax [alendronate]; ibuprofen; paxil [paroxetine hcl]; remeron [mirtazapine]; and vasotec [enalapril].  MEDICATIONS:  Current Outpatient Medications  Medication Sig Dispense Refill  . acetaminophen (TYLENOL) 500 MG tablet Take 500 mg by mouth daily as needed for moderate pain or headache.    Marland Kitchen aspirin 81 MG tablet Take 81 mg by mouth daily.      . Calcium Carbonate-Vitamin D (CALCIUM 600 + D PO) Take 1 tablet by mouth 2 (two) times daily.     . Cholecalciferol (VITAMIN D) 2000 units CAPS Take 2,000 Units by mouth every other day.    Marland Kitchen dexamethasone (DECADRON) 4 MG tablet Take 1 tablet (4 mg total) by mouth daily. 60 tablet 0  . furosemide (LASIX) 20 MG tablet Once daily for 3 days, repeat as needed for lower extremity edema 30 tablet 1  . levothyroxine (SYNTHROID, LEVOTHROID) 88 MCG tablet Take 1 tablet (88 mcg total) by mouth daily before breakfast. 90 tablet 1  . loratadine-pseudoephedrine (CLARITIN-D 24 HOUR) 10-240 MG 24 hr tablet Take 1 tablet by mouth daily as needed for allergies.    . metFORMIN (GLUCOPHAGE-XR) 500 MG 24 hr tablet Take 1 tablet (500 mg total) by mouth 2 (two) times daily. (Patient taking differently: Take 500 mg by mouth 3 (three) times daily. ) 360 tablet 1  . Multiple Vitamin (MULTIVITAMIN WITH MINERALS) TABS tablet Take 1 tablet by mouth daily.    . ondansetron (ZOFRAN) 8  MG tablet Take 1 tablet (8 mg total) by mouth every 8 (eight) hours as needed for refractory nausea / vomiting. Start on day 3 after chemo. (Patient not taking: Reported on 03/21/2019) 30 tablet 1  . pravastatin (PRAVACHOL) 40 MG tablet Take 1 tablet (40 mg total) by mouth every evening. 90 tablet 1  . prochlorperazine (COMPAZINE) 10 MG tablet Take 1 tablet (10 mg total) by mouth every 6 (six) hours as needed (Nausea or vomiting). (Patient not taking: Reported on 03/21/2019) 30 tablet 1  . traMADol (ULTRAM) 50 MG tablet Take 1 tablet (50 mg total) by mouth 3 (three) times daily. Take 1/2 to 1 tablet every 4 hours as needed for severe Back Pain 90 tablet 0  . triamterene-hydrochlorothiazide (MAXZIDE-25) 37.5-25 MG tablet Take 1 tablet by mouth daily. Takes in the am     No current facility-administered medications for this visit.    Facility-Administered Medications Ordered in Other Visits  Medication Dose Route Frequency Provider Last Rate Last Dose  . sodium chloride flush (NS) 0.9 % injection 10 mL  10 mL Intracatheter PRN [name], MD   10 mL at 12/27/18 1825    PHYSICAL EXAMINATION: ECOG PERFORMANCE STATUS: 2 -  Symptomatic, <50% confined to bed  Vitals:   04/07/19 0853  BP: (!) 148/62  Pulse: 100  Resp: 18  Temp: 98.7 F (37.1 C)  SpO2: 97%   Filed Weights   04/07/19 0853  Weight: 142 lb 9.6 oz (64.7 kg)    GENERAL:alert, no distress and comfortable SKIN: skin color, texture, turgor are normal, no rashes or significant lesions EYES: normal, Conjunctiva are pink and non-injected, sclera clear OROPHARYNX:no exudate, no erythema and lips, buccal mucosa, and tongue normal  NECK: supple, thyroid normal size, non-tender, without nodularity LYMPH:  no palpable lymphadenopathy in the cervical, axillary or inguinal LUNGS: clear to auscultation and percussion with normal breathing effort HEART: regular rate & rhythm and no murmurs and no lower extremity edema ABDOMEN:abdomen soft,  non-tender and normal bowel sounds Musculoskeletal:no cyanosis of digits and no clubbing.  She has right flank pain not reproducible on exam NEURO: alert & oriented x 3 with fluent speech, no focal motor/sensory deficits  LABORATORY DATA:  I have reviewed the data as listed    Component Value Date/Time   NA 139 04/05/2019 1154   NA 137 05/31/2017 0931   K 3.5 04/05/2019 1154   K 3.5 05/31/2017 0931   CL 102 04/05/2019 1154   CL 99 12/20/2012 1336   CO2 27 04/05/2019 1154   CO2 25 05/31/2017 0931   GLUCOSE 94 04/05/2019 1154   GLUCOSE 161 (H) 05/31/2017 0931   GLUCOSE 110 (H) 12/20/2012 1336   BUN 13 04/05/2019 1154   BUN 10.9 05/31/2017 0931   CREATININE 0.73 04/05/2019 1154   CREATININE 0.63 07/05/2018 1009   CREATININE 0.8 05/31/2017 0931   CALCIUM 9.8 04/05/2019 1154   CALCIUM 10.6 (H) 05/31/2017 0931   PROT 7.3 04/05/2019 1154   PROT 7.5 05/31/2017 0931   ALBUMIN 3.0 (L) 04/05/2019 1154   ALBUMIN 4.2 05/31/2017 0931   AST 20 04/05/2019 1154   AST 16 05/31/2017 0931   ALT 13 04/05/2019 1154   ALT 15 05/31/2017 0931   ALKPHOS 75 04/05/2019 1154   ALKPHOS 91 05/31/2017 0931   BILITOT 0.3 04/05/2019 1154   BILITOT 0.33 05/31/2017 0931   GFRNONAA >60 04/05/2019 1154   GFRNONAA 87 07/05/2018 1009   GFRAA >60 04/05/2019 1154   GFRAA 100 07/05/2018 1009    No results found for: SPEP, UPEP  Lab Results  Component Value Date   WBC 6.0 04/05/2019   NEUTROABS 4.9 04/05/2019   HGB 9.0 (L) 04/05/2019   HCT 29.2 (L) 04/05/2019   MCV 78.1 (L) 04/05/2019   PLT 479 (H) 04/05/2019      Chemistry      Component Value Date/Time   NA 139 04/05/2019 1154   NA 137 05/31/2017 0931   K 3.5 04/05/2019 1154   K 3.5 05/31/2017 0931   CL 102 04/05/2019 1154   CL 99 12/20/2012 1336   CO2 27 04/05/2019 1154   CO2 25 05/31/2017 0931   BUN 13 04/05/2019 1154   BUN 10.9 05/31/2017 0931   CREATININE 0.73 04/05/2019 1154   CREATININE 0.63 07/05/2018 1009   CREATININE 0.8  05/31/2017 0931      Component Value Date/Time   CALCIUM 9.8 04/05/2019 1154   CALCIUM 10.6 (H) 05/31/2017 0931   ALKPHOS 75 04/05/2019 1154   ALKPHOS 91 05/31/2017 0931   AST 20 04/05/2019 1154   AST 16 05/31/2017 0931   ALT 13 04/05/2019 1154   ALT 15 05/31/2017 0931   BILITOT 0.3 04/05/2019 1154  BILITOT 0.33 05/31/2017 0931      All questions were answered. The patient knows to call the clinic with any problems, questions or concerns. No barriers to learning was detected.  I spent 25 minutes counseling the patient face to face. The total time spent in the appointment was 40 minutes and more than 50% was on counseling and review of test results  [name], MD 04/07/2019 10:09 AM

## 2019-04-07 NOTE — Assessment & Plan Note (Signed)
Unfortunately, she has progressive functional decline She also have new right flank discomfort I am concerned about disease progression I recommend discontinuation of Megace I recommend CT imaging next week for objective assessment of response to therapy and for further evaluation She agreed with the plan of care

## 2019-04-07 NOTE — Telephone Encounter (Signed)
Pt scheduled for CT on Thurs 5/21 at 0830. Arrive at 0815 NPO for 4 hrs prior 1st btl of contrast at 0630 2nd btl at 0730. Pt verbalizes understanding of all instructions.

## 2019-04-07 NOTE — Assessment & Plan Note (Signed)
She has intermittent right flank pain Urinalysis and urine culture were negative I am concerned about disease progression She will continue tramadol for now I will order CT imaging as above

## 2019-04-07 NOTE — Assessment & Plan Note (Signed)
The pattern of anemia is not consistent with iron deficiency, more like anemia chronic illness likely secondary to active disease She does not need iron infusion or blood transfusion

## 2019-04-10 ENCOUNTER — Telehealth: Payer: Self-pay | Admitting: Hematology and Oncology

## 2019-04-10 NOTE — Telephone Encounter (Signed)
Spoke with patient re 5/21 f/u.

## 2019-04-12 ENCOUNTER — Other Ambulatory Visit: Payer: Self-pay

## 2019-04-12 MED ORDER — PREDNISONE 50 MG PO TABS: ORAL_TABLET | ORAL | 0 refills | Status: DC

## 2019-04-12 NOTE — Telephone Encounter (Signed)
Prescription for prednisone sent to pharmacy for pt to take along with benadryl prior to scan for contrast allergy. Pt verbalizes understanding of instructions of when to take medications, when to drink contrast and NPO status.

## 2019-04-13 ENCOUNTER — Ambulatory Visit (HOSPITAL_COMMUNITY)
Admission: RE | Admit: 2019-04-13 | Discharge: 2019-04-13 | Disposition: A | Discharge status: Home / Self Care | Payer: Medicare Other | Source: Ambulatory Visit | Attending: Hematology and Oncology | Admitting: Hematology and Oncology

## 2019-04-13 ENCOUNTER — Other Ambulatory Visit: Payer: Self-pay

## 2019-04-13 ENCOUNTER — Inpatient Hospital Stay (HOSPITAL_BASED_OUTPATIENT_CLINIC_OR_DEPARTMENT_OTHER): Payer: Medicare Other | Admitting: Hematology and Oncology

## 2019-04-13 ENCOUNTER — Encounter (HOSPITAL_COMMUNITY): Payer: Self-pay

## 2019-04-13 ENCOUNTER — Ambulatory Visit (HOSPITAL_COMMUNITY): Admission: RE | Admit: 2019-04-13 | Payer: Medicare Other | Source: Ambulatory Visit

## 2019-04-13 DIAGNOSIS — C787 Secondary malignant neoplasm of liver and intrahepatic bile duct: Secondary | ICD-10-CM | POA: Diagnosis not present

## 2019-04-13 DIAGNOSIS — C541 Malignant neoplasm of endometrium: Secondary | ICD-10-CM | POA: Diagnosis present

## 2019-04-13 DIAGNOSIS — Z7189 Other specified counseling: Secondary | ICD-10-CM

## 2019-04-13 DIAGNOSIS — G893 Neoplasm related pain (acute) (chronic): Secondary | ICD-10-CM

## 2019-04-13 DIAGNOSIS — R64 Cachexia: Secondary | ICD-10-CM | POA: Diagnosis not present

## 2019-04-13 DIAGNOSIS — C7951 Secondary malignant neoplasm of bone: Secondary | ICD-10-CM | POA: Diagnosis present

## 2019-04-13 MED ORDER — HEPARIN SOD (PORK) LOCK FLUSH 100 UNIT/ML IV SOLN
500.0000 [IU] | Freq: Once | INTRAVENOUS | Status: AC
  Administered 2019-04-13: 09:00:00 500 [IU] via INTRAVENOUS

## 2019-04-13 MED ORDER — SODIUM CHLORIDE (PF) 0.9 % IJ SOLN
INTRAMUSCULAR | Status: AC
  Filled 2019-04-13: qty 50

## 2019-04-13 MED ORDER — HEPARIN SOD (PORK) LOCK FLUSH 100 UNIT/ML IV SOLN
INTRAVENOUS | Status: AC
  Administered 2019-04-13: 09:00:00 500 [IU] via INTRAVENOUS
  Filled 2019-04-13: qty 5

## 2019-04-13 MED ORDER — IOHEXOL 300 MG/ML  SOLN
100.0000 mL | Freq: Once | INTRAMUSCULAR | Status: AC | PRN
  Administered 2019-04-13: 09:00:00 100 mL via INTRAVENOUS

## 2019-04-14 ENCOUNTER — Encounter: Payer: Self-pay | Admitting: Hematology and Oncology

## 2019-04-14 NOTE — Assessment & Plan Note (Signed)
The bone lesion is getting smaller Observe only for now

## 2019-04-14 NOTE — Assessment & Plan Note (Signed)
Her appetite has improved on dexamethasone She will continue the same for now

## 2019-04-14 NOTE — Assessment & Plan Note (Signed)
Currently, she has no pain.  She has tramadol to take as needed

## 2019-04-14 NOTE — Assessment & Plan Note (Signed)
We have brief discussion about goals of care The patient is aware that treatment goal is palliative

## 2019-04-14 NOTE — Progress Notes (Signed)
[place] OFFICE PROGRESS NOTE  Patient Care Team: [name], MD as PCP - General (Internal Medicine) [name], MD as Consulting Physician (Gastroenterology) [name], MD as Consulting Physician (Ophthalmology) Cheri Fowler, MD as Consulting Physician (Obstetrics and Gynecology) [name], MD as Consulting Physician (Hematology and Oncology) [name], MD as Consulting Physician (Radiation Oncology)  ASSESSMENT & PLAN:  Endometrial cancer [facility]) I have reviewed her CT imaging Unfortunately, she has market progression of liver metastasis. When the CT report is available, incidentally, she is also noted to have signs of liver metastasis Looking back, there was a small liver lesion 2 months ago Her appetite and energy level has improved with dexamethasone The patient is very disappointed We reviewed the current guidelines Treatment options discussed included doxorubicin, topotecan, bevacizumab or Taxotere The patient is adamant she would not want to be treated with doxorubicin due to history of congestive heart failure in her mother even though the patient has no documentation of heart failure She is undecided Per patient request, I will call her next week for final treatment decision.  Cancer associated pain Currently, she has no pain.  She has tramadol to take as needed  Metastasis to bone [facility]) The bone lesion is getting smaller Observe only for now  Malignant cachexia ([place]) Her appetite has improved on dexamethasone She will continue the same for now  Goals of care, counseling/discussion We have brief discussion about goals of care The patient is aware that treatment goal is palliative   No orders of the defined types were placed in this encounter.   INTERVAL HISTORY: Please see below for problem oriented charting. 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 SEE COMMENT.  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 The greatest extent of invasive carcinoma as measured on the needle core biopsy measures 0.6 cm.       Endometrial cancer ([place])   02/03/2002 Pathology Results    1. BENIGN ENDOMETRIAL POLYPS.  2. UTERINE FIBROIDS: PORTIONS OF SMOOTH MUSCLE CONSISTENT WITH BENIGN LEIOMYOMA(S). PORTIONS OF BENIGN, CYSTICALLY ATROPHIC ENDOMETRIUM WITHOUT HYPERPLASIA OR EVIDENCE OF MALIGNANCY. 3. ENDOMETRIAL CURETTAGE: BENIGN ENDOMETRIUM AND SMOOTH MUSCLE.    09/07/2016 Pathology Results    PAP smear positive for malignant cells    09/07/2016 Initial Diagnosis    Patient had postmenopausal bleeding in 08-2016. She had PAP 09-07-16 by PCP Dr [name], which documented carcinoma. She had evaluation by Dr [name] with colposcopy/ ECC/endometrial biopsy documenting serous endometrioid endometrial carcinoma.    09/15/2016 Imaging    Ct imaging showed markedly thickened and heterogeneous endometrial stripe suspicious for endometrial carcinoma in this patient with postmenopausal vaginal bleeding. Cystic lesion in the right ovary cannot be definitively characterized. Pelvic ultrasound is recommended for further evaluation. Aortoiliac atherosclerosis. Avascular necrosis of the femoral heads bilaterally.    10/07/2016 Tumor Marker    Patient's tumor was  tested for the following  markers: CA125 Results of the tumor marker test revealed 15.1    10/13/2016 Pathology Results    1. Lymph node, sentinel, biopsy, right obturator - THERE IS NO EVIDENCE OF CARCINOMA IN 8 OF 8 LYMPH NODES (0/8). - SEE COMMENT. 2. Lymph node, sentinel, biopsy, left obturator - THERE IS NO EVIDENCE OF CARCINOMA IN 1 OF 1 LYMPH NODE (0/1). - SEE COMMENT. 3. Lymph node, sentinel, biopsy, left para-aortic - THERE IS NO EVIDENCE OF CARCINOMA IN 4 OF 4 LYMPH NODES (0/4). - SEE COMMENT. 4. Uterus +/- tubes/ovaries, neoplastic, cervix - INVASIVE MIXED SEROUS/ENDOMETRIOID ADENOCARCINOMA, MULTIPLE FOCI, FIGO GRADE III, THE LARGEST FOCUS SPANS 8.2 CM. - ADENOCARCINOMA EXTENDS INTO THE OUTER HALF OF THE MYOMETRIUM AND INVOLVES THE STROMA OF THE LOWER UTERINE SEGMENT AND CERVIX. - LYMPHOVASCULAR INVASION IS IDENTIFIED. - THE SURGICAL RESECTION MARGINS ARE NEGATIVE FOR CARCINOMA. - SEE ONCOLOGY TABLE BELOW. ADDITIONAL FINDINGS: - ENDOMETRIUM: ENDOMETRIOID TYPE POLYP(S), WHICH CONTAIN FOCI OF SIMILAR APPEARING MIXED SEROUS/ENDOMETRIOID ADENOCARCINOMA. - MYOMETRIUM: LEIOMYOMATA. - SEROSA: UNREMARKABLE. - BILATERAL ADNEXA: BENIGN OVARIES AND FALLOPIAN TUBES.    10/13/2016 Surgery    Surgery: Total robotic hysterectomy bilateral salpingo-oophorectomy, bilateral pelvic sentinel lymph node removal, left PA sentinel lymph node removal. Mini-laparotomy for specimen removal Surgeons:  [name], MD; [name], MD  Pathology:  1)Uterus, cervix, bilateral tubes and ovaries 2) Right obturator SLN 3) Left Obturator SLN 4) Left PA SLN Operative findings: 14 week fibroid uterus with one large dominant 6 cm myoma that was palpably calcified. 4 cm right ovarian cyst. + SLN identified in the bilateral obturator spaces, + SLN identified in the left PA space.     12/01/2016 - 05/31/2017 Chemotherapy    She received carboplatin & Taxol. She had 3 cycles of chemo followed by radiation and then 3 more  cycles of chemo    01/11/2017 Genetic Testing    Testing was normal and did not reveal a mutation in these genes: The genes tested were the 80 genes on [employer]'s Multi-Cancer panel (ALK, APC, ATM, AXIN2, BAP1, BARD1, BLM, BMPR1A, BRCA1, BRCA2, BRIP1, CASR, CDC73, CDH1, CDK4, CDKN1B, CDKN1C, CDKN2A, CEBPA, CHEK2, DICER1, DIS3L2, EGFR, EPCAM, FH, FLCN, GATA2, GPC3, GREM1, HOXB13,  HRAS, KIT, MAX, MEN1, MET, MITF, MLH1, MSH2, MSH6, MUTYH, NBN, NF1, NF2, PALB2, PDGFRA, PHOX2B, PMS2, POLD1, POLE, POT1, PRKAR1A, PTCH1, PTEN, RAD50, RAD51C, RAD51D, RB1, RECQL4, RET, RUNX1, SDHA, SDHAF2, SDHB, SDHC, SDHD, SMAD4, SMARCA4, SMARCB1, SMARCE1, STK11, SUFU, TERC, TERT, TMEM127, TP53, TSC1, TSC2, VHL, WRN, and WT1).    02/10/2017 - 04/12/2017 Radiation Therapy    02/10/17-03/16/17; 03/29/17-04/12/17;  1) Pelvis/ 45 Gy in 25 fractions  2) Vaginal Cuff/ 18 Gy in 3 fractions    08/26/2018 Imaging    Large destructive mass lesion left iliac bone with large soft tissue mass extending into the posterior soft tissues and pelvis compatible with metastatic disease. Consider follow-up CT chest abdomen pelvis with contrast for further staging.  Radiation changes at L5 and the sacrum. No lumbar metastatic deposits  Multilevel degenerative changes above.    08/29/2018 Imaging    1. 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 2. New irregular nodular opacity in the medial right middle lobe, new since 2017 CT. Separate subcentimeter right upper lobe solid pulmonary nodule, for which no comparison exists. These findings are indeterminate for pulmonary metastases and attention on follow-up chest CT in 3 months is recommended. 3. No lymphadenopathy or additional findings of metastatic disease.No  tumor recurrence at the hysterectomy margin. 4. Aortic Atherosclerosis (ICD10-I70.0). Additional chronic findings as detailed.    08/29/2018 Tumor Marker    Patient's tumor was tested for the  following markers: CA-125 Results of the tumor marker test revealed 17.4    09/06/2018 Procedure    CT-guided core biopsy performed of huge mass in the left iliac fossa destroying the left iliac bone.    09/06/2018 Pathology Results    Soft Tissue Needle Core Biopsy, Left Iliac Fossa - POORLY DIFFERENTIATED MALIGNANT NEOPLASM. Microscopic Comment The provided clinical history of both endometrial and breast cancer is noted. In the current case, immunohistochemistry for qualitative ER demonstrates scattered positive staining. CK8/18 is focally positive. CKAE1/3, CK7, CK20, TTF-1, CDX-2, GATA3 and PAX 8 are negative. The immunophenotype is not specific as to an origin for this poorly differentiated malignant neoplasm. The morphology is more suggestive of endometrial cancer than breast cancer, and may represent an unrecognized carcinosarcomatous component.    09/08/2018 - 09/27/2018 Radiation Therapy    She had palliative radiation treatment    09/29/2018 Procedure    Placement of single lumen port a cath via right internal jugular vein. The catheter tip lies at the cavo-atrial junction. A power injectable port a cath was placed and is ready for immediate use.    10/04/2018 - 01/17/2019 Chemotherapy    The patient had carboplatin and taxol x 6 cycles    12/05/2018 Tumor Marker    Patient's tumor was tested for the following markers: CA125 Results of the tumor marker test revealed 5.5    12/05/2018 Imaging    1. Considerable cystic degeneration in the large destructive mass of the left iliac bone. New or progressive left iliac bone pathologic fracture extending into the left SI joint, along with abnormal subluxation at the pubic symphysis indicating pubic symphysis instability. 2. Previous pleural-based nodularity in the right middle lobe is markedly improved, currently 4 mm in thickness and previously 11 mm in thickness. 3. Indistinct new wedge-shaped lesion in the left mid kidney, hypodense on  portal venous phase images and hyperdense on delayed phase images, suggesting prolong tubular transit. This is a nonspecific finding but could reflect local inflammation, infection, or less likely infiltrative tumor. 4. Other imaging findings of potential clinical significance: Aortic Atherosclerosis (ICD10-I70.0). Left main coronary artery atherosclerosis. 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    02/15/2019 Imaging    1. Slight progression of large expansile lytic lesion involving the left iliac bone. This demonstrates central low density, suggesting partial necrosis. This lesion was biopsied on 09/06/2018, revealing poorly differentiated malignant neoplasm. 2. New low-density lesion in the dome of the right hepatic lobe worrisome for cystic or treated metastasis. 3. No other evidence of metastatic disease. No explanation for the patient's symptoms. 4. 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     03/03/2019 - 04/07/2019 Anti-estrogen oral therapy    She cannot tolerate tamoxifen and megace    03/23/2019 Tumor Marker    Patient's tumor was tested for the following markers: CA125 Results of the tumor marker test revealed 3.8    04/13/2019 Imaging    1. 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 2. Mild decrease in size of large lytic bone metastasis and associated soft tissue mass involving the left ilium. 3. Unstable pathologic fracture of the left ilium, without significant change since prior exam. 4. New right lower  lobe pulmonary metastases.       Metastasis to bone ([facility])    REVIEW OF SYSTEMS:   Constitutional: Denies fevers, chills or abnormal weight loss Eyes: Denies blurriness of vision Ears, nose, mouth, throat, and face: Denies mucositis or sore throat Respiratory: Denies cough, dyspnea or wheezes Cardiovascular:  Denies palpitation, chest discomfort or lower extremity swelling Gastrointestinal:  Denies nausea, heartburn or change in bowel habits Skin: Denies abnormal skin rashes Lymphatics: Denies new lymphadenopathy or easy bruising Neurological:Denies numbness, tingling or new weaknesses Behavioral/Psych: Mood is stable, no new changes  All other systems were reviewed with the patient and are negative.  I have reviewed the past medical history, past surgical history, social history and family history with the patient and they are unchanged from previous note.  ALLERGIES:  is allergic to omnipaque [iohexol]; calan [verapamil]; effexor [venlafaxine]; erythromycin; femara [letrozole]; fosamax [alendronate]; ibuprofen; paxil [paroxetine hcl]; remeron [mirtazapine]; and vasotec [enalapril].  MEDICATIONS:  Current Outpatient Medications  Medication Sig Dispense Refill  . acetaminophen (TYLENOL) 500 MG tablet Take 500 mg by mouth daily as needed for moderate pain or headache.    Marland Kitchen aspirin 81 MG tablet Take 81 mg by mouth daily.      . Calcium Carbonate-Vitamin D (CALCIUM 600 + D PO) Take 1 tablet by mouth 2 (two) times daily.     . Cholecalciferol (VITAMIN D) 2000 units CAPS Take 2,000 Units by mouth every other day.    Marland Kitchen dexamethasone (DECADRON) 4 MG tablet Take 1 tablet (4 mg total) by mouth daily. 60 tablet 0  . furosemide (LASIX) 20 MG tablet Once daily for 3 days, repeat as needed for lower extremity edema 30 tablet 1  . levothyroxine (SYNTHROID, LEVOTHROID) 88 MCG tablet Take 1 tablet (88 mcg total) by mouth daily before breakfast. 90 tablet 1  . loratadine-pseudoephedrine (CLARITIN-D 24 HOUR) 10-240 MG 24 hr tablet Take 1 tablet by mouth daily as needed for allergies.    . metFORMIN (GLUCOPHAGE-XR) 500 MG 24 hr tablet Take 1 tablet (500 mg total) by mouth 2 (two) times daily. (Patient taking differently: Take 500 mg by mouth 3 (three) times daily. ) 360 tablet 1  . Multiple Vitamin (MULTIVITAMIN  WITH MINERALS) TABS tablet Take 1 tablet by mouth daily.    . ondansetron (ZOFRAN) 8 MG tablet Take 1 tablet (8 mg total) by mouth every 8 (eight) hours as needed for refractory nausea / vomiting. Start on day 3 after chemo. (Patient not taking: Reported on 03/21/2019) 30 tablet 1  . pravastatin (PRAVACHOL) 40 MG tablet Take 1 tablet (40 mg total) by mouth every evening. 90 tablet 1  . prochlorperazine (COMPAZINE) 10 MG tablet Take 1 tablet (10 mg total) by mouth every 6 (six) hours as needed (Nausea or vomiting). (Patient not taking: Reported on 03/21/2019) 30 tablet 1  . traMADol (ULTRAM) 50 MG tablet Take 1 tablet (50 mg total) by mouth 3 (three) times daily. Take 1/2 to 1 tablet every 4 hours as needed for severe Back Pain 90 tablet 0  . triamterene-hydrochlorothiazide (MAXZIDE-25) 37.5-25 MG tablet Take 1 tablet by mouth daily. Takes in the am     No current facility-administered medications for this visit.    Facility-Administered Medications Ordered in Other Visits  Medication Dose Route Frequency Provider Last Rate Last Dose  . sodium chloride flush (NS) 0.9 % injection 10 mL  10 mL Intracatheter PRN [name], MD   10 mL at 12/27/18 1825    PHYSICAL EXAMINATION: ECOG PERFORMANCE  STATUS: 1 - Symptomatic but completely ambulatory  Vitals:   04/13/19 1549  BP: 128/72  Pulse: 100  Resp: 18  Temp: 98 F (36.7 C)  SpO2: 100%   Filed Weights   04/13/19 1549  Weight: 144 lb (65.3 kg)    GENERAL:alert, no distress and comfortable NEURO: alert & oriented x 3 with fluent speech, no focal motor/sensory deficits  LABORATORY DATA:  I have reviewed the data as listed    Component Value Date/Time   NA 139 04/05/2019 1154   NA 137 05/31/2017 0931   K 3.5 04/05/2019 1154   K 3.5 05/31/2017 0931   CL 102 04/05/2019 1154   CL 99 12/20/2012 1336   CO2 27 04/05/2019 1154   CO2 25 05/31/2017 0931   GLUCOSE 94 04/05/2019 1154   GLUCOSE 161 (H) 05/31/2017 0931   GLUCOSE 110 (H)  12/20/2012 1336   BUN 13 04/05/2019 1154   BUN 10.9 05/31/2017 0931   CREATININE 0.73 04/05/2019 1154   CREATININE 0.63 07/05/2018 1009   CREATININE 0.8 05/31/2017 0931   CALCIUM 9.8 04/05/2019 1154   CALCIUM 10.6 (H) 05/31/2017 0931   PROT 7.3 04/05/2019 1154   PROT 7.5 05/31/2017 0931   ALBUMIN 3.0 (L) 04/05/2019 1154   ALBUMIN 4.2 05/31/2017 0931   AST 20 04/05/2019 1154   AST 16 05/31/2017 0931   ALT 13 04/05/2019 1154   ALT 15 05/31/2017 0931   ALKPHOS 75 04/05/2019 1154   ALKPHOS 91 05/31/2017 0931   BILITOT 0.3 04/05/2019 1154   BILITOT 0.33 05/31/2017 0931   GFRNONAA >60 04/05/2019 1154   GFRNONAA 87 07/05/2018 1009   GFRAA >60 04/05/2019 1154   GFRAA 100 07/05/2018 1009    No results found for: SPEP, UPEP  Lab Results  Component Value Date   WBC 6.0 04/05/2019   NEUTROABS 4.9 04/05/2019   HGB 9.0 (L) 04/05/2019   HCT 29.2 (L) 04/05/2019   MCV 78.1 (L) 04/05/2019   PLT 479 (H) 04/05/2019      Chemistry      Component Value Date/Time   NA 139 04/05/2019 1154   NA 137 05/31/2017 0931   K 3.5 04/05/2019 1154   K 3.5 05/31/2017 0931   CL 102 04/05/2019 1154   CL 99 12/20/2012 1336   CO2 27 04/05/2019 1154   CO2 25 05/31/2017 0931   BUN 13 04/05/2019 1154   BUN 10.9 05/31/2017 0931   CREATININE 0.73 04/05/2019 1154   CREATININE 0.63 07/05/2018 1009   CREATININE 0.8 05/31/2017 0931      Component Value Date/Time   CALCIUM 9.8 04/05/2019 1154   CALCIUM 10.6 (H) 05/31/2017 0931   ALKPHOS 75 04/05/2019 1154   ALKPHOS 91 05/31/2017 0931   AST 20 04/05/2019 1154   AST 16 05/31/2017 0931   ALT 13 04/05/2019 1154   ALT 15 05/31/2017 0931   BILITOT 0.3 04/05/2019 1154   BILITOT 0.33 05/31/2017 0931       RADIOGRAPHIC STUDIES: I have reviewed multiple imaging studies with the patient I have personally reviewed the radiological images as listed and agreed with the findings in the report. Ct Abdomen Pelvis W Contrast  Result Date: 04/13/2019 CLINICAL  DATA:  Follow-up metastatic endometrial carcinoma. Previous surgery, chemotherapy, and radiation therapy. EXAM: CT ABDOMEN AND PELVIS WITH CONTRAST TECHNIQUE: Multidetector CT imaging of the abdomen and pelvis was performed using the standard protocol following bolus administration of intravenous contrast. CONTRAST:  163m OMNIPAQUE IOHEXOL 300 MG/ML  SOLN COMPARISON:  02/15/2019  FINDINGS: Lower Chest: 2 new pulmonary nodule seen in the right lower lobe, largest measuring 1.7 cm, consistent with pulmonary metastases. Hepatobiliary: Marked increase in size of heterogeneously enhancing mass in right hepatic lobe, currently measuring 12.9 x 9.8 cm, compared to 1.8 x 1.3 cm previously. No other liver masses identified. Gallbladder is unremarkable. No evidence of biliary ductal dilatation. Pancreas:  No mass or inflammatory changes. Spleen: Within normal limits in size and appearance. Adrenals/Urinary Tract: No masses identified. A few tiny renal cysts are again seen bilaterally. No evidence of hydronephrosis. Stomach/Bowel: No evidence of obstruction, inflammatory process or abnormal fluid collections. Normal appendix visualized. Vascular/Lymphatic: No pathologically enlarged lymph nodes. No abdominal aortic aneurysm. Aortic atherosclerosis. Reproductive: Prior hysterectomy noted. Adnexal regions are unremarkable in appearance. Other:  None. 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 Associated soft tissue mass is mildly decreased in size, currently measuring 7.5 x 6.1 cm on image 55/2, compared to 8.1 x 7.3 cm previously. No other bone metastases identified. IMPRESSION: 1. 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 2. Mild decrease in size of large lytic bone metastasis and associated soft tissue mass involving the left ilium. 3. Unstable  pathologic fracture of the left ilium, without significant change since prior exam. 4. New right lower lobe pulmonary metastases. Electronically Signed   By: Earle Gell M.D.   On: 04/13/2019 12:02    All questions were answered. The patient knows to call the clinic with any problems, questions or concerns. No barriers to learning was detected.  I spent 30 minutes counseling the patient face to face. The total time spent in the appointment was 40 minutes and more than 50% was on counseling and review of test results  [name], MD 04/14/2019 8:46 AM

## 2019-04-14 NOTE — Assessment & Plan Note (Signed)
I have reviewed her CT imaging Unfortunately, she has market progression of liver metastasis. When the CT report is available, incidentally, she is also noted to have signs of liver metastasis Looking back, there was a small liver lesion 2 months ago Her appetite and energy level has improved with dexamethasone The patient is very disappointed We reviewed the current guidelines Treatment options discussed included doxorubicin, topotecan, bevacizumab or Taxotere The patient is adamant she would not want to be treated with doxorubicin due to history of congestive heart failure in her mother even though the patient has no documentation of heart failure She is undecided Per patient request, I will call her next week for final treatment decision.

## 2019-04-18 ENCOUNTER — Ambulatory Visit: Payer: Medicare Other | Admitting: Hematology and Oncology

## 2019-04-18 ENCOUNTER — Telehealth: Payer: Self-pay | Admitting: Hematology and Oncology

## 2019-04-18 ENCOUNTER — Telehealth: Payer: Self-pay

## 2019-04-18 ENCOUNTER — Other Ambulatory Visit: Payer: Self-pay | Admitting: Hematology and Oncology

## 2019-04-18 DIAGNOSIS — C7951 Secondary malignant neoplasm of bone: Secondary | ICD-10-CM

## 2019-04-18 DIAGNOSIS — C787 Secondary malignant neoplasm of liver and intrahepatic bile duct: Secondary | ICD-10-CM

## 2019-04-18 DIAGNOSIS — Z7189 Other specified counseling: Secondary | ICD-10-CM

## 2019-04-18 DIAGNOSIS — C541 Malignant neoplasm of endometrium: Secondary | ICD-10-CM

## 2019-04-18 NOTE — Telephone Encounter (Signed)
I spoke with the patient and her son The patient has made informed decision to proceed with docetaxel for palliative chemotherapy I have sent scheduling message to schedule the appointment for next week.

## 2019-04-18 NOTE — Progress Notes (Signed)
DISCONTINUE ON PATHWAY REGIMEN - Uterine     A cycle is every 21 days:     Paclitaxel        Dose Mod: None     Carboplatin        Dose Mod: None  **Always confirm dose/schedule in your pharmacy ordering system**  REASON: Disease Progression PRIOR TREATMENT: UTOS50: Carboplatin + Paclitaxel (6/175) q21 Days x 6 Cycles TREATMENT RESPONSE: Progressive Disease (PD)  START OFF PATHWAY REGIMEN - Uterine   [id number]:Docetaxel 75 mg/m2:   A cycle is every 21 days:     Docetaxel   **Always confirm dose/schedule in your pharmacy ordering system**  Patient Characteristics: Papillary Serous and Clear Cell Histology, Recurrent/Progressive Disease, Third Line and Beyond Histology: Papillary Serous and Clear Cell Histology Therapeutic Status: Recurrent or Progressive Disease AJCC T Category: T2 AJCC N Category: N0 AJCC M Category: M1 AJCC 8 Stage Grouping: IVB Line of Therapy: Third Line and Beyond Intent of Therapy: Non-Curative / Palliative Intent, Discussed with Patient

## 2019-04-18 NOTE — Telephone Encounter (Signed)
She called and left a message. She is supposed to call and talk with Dr. Alvy Bimler on a telephone call.

## 2019-04-20 ENCOUNTER — Telehealth: Payer: Self-pay | Admitting: Hematology and Oncology

## 2019-04-20 NOTE — Telephone Encounter (Signed)
Left message re 6/1 appointments.

## 2019-04-24 ENCOUNTER — Other Ambulatory Visit: Payer: Self-pay

## 2019-04-24 ENCOUNTER — Inpatient Hospital Stay: Payer: Medicare Other | Attending: Hematology and Oncology

## 2019-04-24 ENCOUNTER — Inpatient Hospital Stay: Payer: Medicare Other

## 2019-04-24 ENCOUNTER — Inpatient Hospital Stay (HOSPITAL_BASED_OUTPATIENT_CLINIC_OR_DEPARTMENT_OTHER): Payer: Medicare Other | Admitting: Hematology and Oncology

## 2019-04-24 ENCOUNTER — Encounter: Payer: Self-pay | Admitting: Hematology and Oncology

## 2019-04-24 ENCOUNTER — Other Ambulatory Visit: Payer: Self-pay | Admitting: Hematology and Oncology

## 2019-04-24 VITALS — BP 163/87 | HR 68 | Temp 98.2°F | Resp 20

## 2019-04-24 DIAGNOSIS — T451X5A Adverse effect of antineoplastic and immunosuppressive drugs, initial encounter: Secondary | ICD-10-CM

## 2019-04-24 DIAGNOSIS — R64 Cachexia: Secondary | ICD-10-CM | POA: Insufficient documentation

## 2019-04-24 DIAGNOSIS — G893 Neoplasm related pain (acute) (chronic): Secondary | ICD-10-CM | POA: Diagnosis not present

## 2019-04-24 DIAGNOSIS — Z5111 Encounter for antineoplastic chemotherapy: Secondary | ICD-10-CM | POA: Insufficient documentation

## 2019-04-24 DIAGNOSIS — C7801 Secondary malignant neoplasm of right lung: Secondary | ICD-10-CM

## 2019-04-24 DIAGNOSIS — D6481 Anemia due to antineoplastic chemotherapy: Secondary | ICD-10-CM | POA: Insufficient documentation

## 2019-04-24 DIAGNOSIS — D61818 Other pancytopenia: Secondary | ICD-10-CM | POA: Diagnosis not present

## 2019-04-24 DIAGNOSIS — C541 Malignant neoplasm of endometrium: Secondary | ICD-10-CM

## 2019-04-24 DIAGNOSIS — Z7189 Other specified counseling: Secondary | ICD-10-CM

## 2019-04-24 DIAGNOSIS — C7951 Secondary malignant neoplasm of bone: Secondary | ICD-10-CM

## 2019-04-24 DIAGNOSIS — R5381 Other malaise: Secondary | ICD-10-CM | POA: Insufficient documentation

## 2019-04-24 DIAGNOSIS — C787 Secondary malignant neoplasm of liver and intrahepatic bile duct: Secondary | ICD-10-CM | POA: Diagnosis not present

## 2019-04-24 LAB — CMP (CANCER CENTER ONLY)
ALT: 36 U/L (ref 0–44)
AST: 32 U/L (ref 15–41)
Albumin: 3.4 g/dL — ABNORMAL LOW (ref 3.5–5.0)
Alkaline Phosphatase: 121 U/L (ref 38–126)
Anion gap: 11 (ref 5–15)
BUN: 16 mg/dL (ref 8–23)
CO2: 26 mmol/L (ref 22–32)
Calcium: 9.1 mg/dL (ref 8.9–10.3)
Chloride: 100 mmol/L (ref 98–111)
Creatinine: 0.81 mg/dL (ref 0.44–1.00)
GFR, Est AFR Am: >60 mL/min (ref >60)
GFR, Estimated: >60 mL/min (ref >60)
Glucose, Bld: 199 mg/dL — ABNORMAL HIGH (ref 70–99)
Potassium: 3.8 mmol/L (ref 3.5–5.1)
Sodium: 137 mmol/L (ref 135–145)
Total Bilirubin: 0.5 mg/dL (ref 0.3–1.2)
Total Protein: 6.8 g/dL (ref 6.5–8.1)

## 2019-04-24 LAB — CBC WITH DIFFERENTIAL (CANCER CENTER ONLY)
Abs Immature Granulocytes: 0.05 10*3/uL (ref 0.00–0.07)
Basophils Absolute: 0 10*3/uL (ref 0.0–0.1)
Basophils Relative: 0 %
Eosinophils Absolute: 0 10*3/uL (ref 0.0–0.5)
Eosinophils Relative: 0 %
HCT: 34.9 % — ABNORMAL LOW (ref 36.0–46.0)
Hemoglobin: 11 g/dL — ABNORMAL LOW (ref 12.0–15.0)
Immature Granulocytes: 1 %
Lymphocytes Relative: 3 %
Lymphs Abs: 0.3 10*3/uL — ABNORMAL LOW (ref 0.7–4.0)
MCH: 25 pg — ABNORMAL LOW (ref 26.0–34.0)
MCHC: 31.5 g/dL (ref 30.0–36.0)
MCV: 79.3 fL — ABNORMAL LOW (ref 80.0–100.0)
Monocytes Absolute: 0.2 10*3/uL (ref 0.1–1.0)
Monocytes Relative: 2 %
Neutro Abs: 8.9 10*3/uL — ABNORMAL HIGH (ref 1.7–7.7)
Neutrophils Relative %: 94 %
Platelet Count: 320 10*3/uL (ref 150–400)
RBC: 4.4 MIL/uL (ref 3.87–5.11)
RDW: 20.4 % — ABNORMAL HIGH (ref 11.5–15.5)
WBC Count: 9.5 10*3/uL (ref 4.0–10.5)
nRBC: 0 % (ref 0.0–0.2)

## 2019-04-24 LAB — SAMPLE TO BLOOD BANK

## 2019-04-24 MED ORDER — SODIUM CHLORIDE 0.9% FLUSH
10.0000 mL | INTRAVENOUS | Status: DC | PRN
  Administered 2019-04-24: 10 mL
  Filled 2019-04-24: qty 10

## 2019-04-24 MED ORDER — DEXAMETHASONE SODIUM PHOSPHATE 10 MG/ML IJ SOLN
INTRAMUSCULAR | Status: AC
  Filled 2019-04-24: qty 1

## 2019-04-24 MED ORDER — SODIUM CHLORIDE 0.9 % IV SOLN: 10.0000 mg | Freq: Once | INTRAVENOUS | Status: DC

## 2019-04-24 MED ORDER — SODIUM CHLORIDE 0.9 % IV SOLN
75.0000 mg/m2 | Freq: Once | INTRAVENOUS | Status: AC
  Administered 2019-04-24: 130 mg via INTRAVENOUS
  Filled 2019-04-24: qty 13

## 2019-04-24 MED ORDER — HEPARIN SOD (PORK) LOCK FLUSH 100 UNIT/ML IV SOLN
500.0000 [IU] | Freq: Once | INTRAVENOUS | Status: AC | PRN
  Administered 2019-04-24: 500 [IU]
  Filled 2019-04-24: qty 5

## 2019-04-24 MED ORDER — SODIUM CHLORIDE 0.9% FLUSH
10.0000 mL | Freq: Once | INTRAVENOUS | Status: AC
  Administered 2019-04-24: 10 mL
  Filled 2019-04-24: qty 10

## 2019-04-24 MED ORDER — DEXAMETHASONE SODIUM PHOSPHATE 10 MG/ML IJ SOLN
10.0000 mg | Freq: Once | INTRAMUSCULAR | Status: AC
  Administered 2019-04-24: 10 mg via INTRAVENOUS

## 2019-04-24 MED ORDER — SODIUM CHLORIDE 0.9 % IV SOLN
Freq: Once | INTRAVENOUS | Status: AC
  Administered 2019-04-24: 14:00:00 via INTRAVENOUS
  Filled 2019-04-24: qty 250

## 2019-04-24 NOTE — Assessment & Plan Note (Signed)
We reviewed plan of care We discussed treatment options again She is willing to proceed with treatment as scheduled We discussed risk, benefits, side effects of Taxotere and she agreed with the plan of care I recommend minimum 3 cycles of treatment before repeat imaging.

## 2019-04-24 NOTE — Patient Instructions (Signed)
[facility] Discharge Instructions for Patients Receiving Chemotherapy  Today you received the following chemotherapy agents: taxotere.  To help prevent nausea and vomiting after your treatment, we encourage you to take your nausea medication as prescribed.   If you develop nausea and vomiting that is not controlled by your nausea medication, call the clinic.   BELOW ARE SYMPTOMS THAT SHOULD BE REPORTED IMMEDIATELY:  *FEVER GREATER THAN 100.5 F  *CHILLS WITH OR WITHOUT FEVER  NAUSEA AND VOMITING THAT IS NOT CONTROLLED WITH YOUR NAUSEA MEDICATION  *UNUSUAL SHORTNESS OF BREATH  *UNUSUAL BRUISING OR BLEEDING  TENDERNESS IN MOUTH AND THROAT WITH OR WITHOUT PRESENCE OF ULCERS  *URINARY PROBLEMS  *BOWEL PROBLEMS  UNUSUAL RASH Items with * indicate a potential emergency and should be followed up as soon as possible.  Feel free to call the clinic should you have any questions or concerns. The clinic phone number is [phone].  Please show the [place] at check-in to the Emergency Department and triage nurse.  Docetaxel injection What is this medicine? DOCETAXEL (doe se TAX el) is a chemotherapy drug. It targets fast dividing cells, like cancer cells, and causes these cells to die. This medicine is used to treat many types of cancers like breast cancer, certain stomach cancers, head and neck cancer, lung cancer, and prostate cancer. This medicine may be used for other purposes; ask your health care provider or pharmacist if you have questions. COMMON BRAND NAME(S): Docefrez, Taxotere What should I tell my health care provider before I take this medicine? They need to know if you have any of these conditions: -infection (especially a virus infection such as chickenpox, cold sores, or herpes) -liver disease -low blood counts, like low white cell, platelet, or red cell counts -an unusual or allergic reaction to docetaxel, polysorbate 80, other chemotherapy  agents, other medicines, foods, dyes, or preservatives -pregnant or trying to get pregnant -breast-feeding How should I use this medicine? This drug is given as an infusion into a vein. It is administered in a hospital or clinic by a specially trained health care professional. Talk to your pediatrician regarding the use of this medicine in children. Special care may be needed. Overdosage: If you think you have taken too much of this medicine contact a poison control center or emergency room at once. NOTE: This medicine is only for you. Do not share this medicine with others. What if I miss a dose? It is important not to miss your dose. Call your doctor or health care professional if you are unable to keep an appointment. What may interact with this medicine? -cyclosporine -erythromycin -ketoconazole -medicines to increase blood counts like filgrastim, pegfilgrastim, sargramostim -vaccines Talk to your doctor or health care professional before taking any of these medicines: -acetaminophen -aspirin -ibuprofen -ketoprofen -naproxen This list may not describe all possible interactions. Give your health care provider a list of all the medicines, herbs, non-prescription drugs, or dietary supplements you use. Also tell them if you smoke, drink alcohol, or use illegal drugs. Some items may interact with your medicine. What should I watch for while using this medicine? Your condition will be monitored carefully while you are receiving this medicine. You will need important blood work done while you are taking this medicine. This drug may make you feel generally unwell. This is not uncommon, as chemotherapy can affect healthy cells as well as cancer cells. Report any side effects. Continue your course of treatment even though you feel ill unless  your doctor tells you to stop. In some cases, you may be given additional medicines to help with side effects. Follow all directions for their use. Call  your doctor or health care professional for advice if you get a fever, chills or sore throat, or other symptoms of a cold or flu. Do not treat yourself. This drug decreases your body's ability to fight infections. Try to avoid being around people who are sick. This medicine may increase your risk to bruise or bleed. Call your doctor or health care professional if you notice any unusual bleeding. This medicine may contain alcohol in the product. You may get drowsy or dizzy. Do not drive, use machinery, or do anything that needs mental alertness until you know how this medicine affects you. Do not stand or sit up quickly, especially if you are an older patient. This reduces the risk of dizzy or fainting spells. Avoid alcoholic drinks. Do not become pregnant while taking this medicine or for 6 months after stopping it. Women should inform their doctor if they wish to become pregnant or think they might be pregnant. Men should not father a child while taking this medicine and for 3 months after stopping it. There is a potential for serious side effects to an unborn child. Talk to your health care professional or pharmacist for more information. Do not breast-feed an infant while taking this medicine or for 2 weeks after stopping it. This may interfere with the ability to father a child. You should talk to your doctor or health care professional if you are concerned about your fertility. What side effects may I notice from receiving this medicine? Side effects that you should report to your doctor or health care professional as soon as possible: -allergic reactions like skin rash, itching or hives, swelling of the face, lips, or tongue -low blood counts - This drug may decrease the number of white blood cells, red blood cells and platelets. You may be at increased risk for infections and bleeding. -signs of infection - fever or chills, cough, sore throat, pain or difficulty passing urine -signs of decreased  platelets or bleeding - bruising, pinpoint red spots on the skin, black, tarry stools, nosebleeds -signs of decreased red blood cells - unusually weak or tired, fainting spells, lightheadedness -breathing problems -fast or irregular heartbeat -low blood pressure -mouth sores -nausea and vomiting -pain, swelling, redness or irritation at the injection site -pain, tingling, numbness in the hands or feet -swelling of the ankle, feet, hands -weight gain Side effects that usually do not require medical attention (report to your doctor or health care professional if they continue or are bothersome): -bone pain -complete hair loss including hair on your head, underarms, pubic hair, eyebrows, and eyelashes -diarrhea -excessive tearing -changes in the color of fingernails -loosening of the fingernails -nausea -muscle pain -red flush to skin -sweating -weak or tired This list may not describe all possible side effects. Call your doctor for medical advice about side effects. You may report side effects to FDA at 1-800-FDA-1088. Where should I keep my medicine? This drug is given in a hospital or clinic and will not be stored at home. NOTE: This sheet is a summary. It may not cover all possible information. If you have questions about this medicine, talk to your doctor, pharmacist, or health care provider.  2019 [employer] (2017-12-06 12:07:21)

## 2019-04-24 NOTE — Assessment & Plan Note (Signed)
Her appetite has improved on dexamethasone She will continue the same for now I suspect her appetite will improve once chemotherapy is started

## 2019-04-24 NOTE — Assessment & Plan Note (Signed)
We have brief discussion about goals of care The patient is aware that treatment goal is palliative

## 2019-04-24 NOTE — Assessment & Plan Note (Signed)
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

## 2019-04-24 NOTE — Progress Notes (Signed)
[place] OFFICE PROGRESS NOTE  Patient Care Team: [name], MD as PCP - General (Internal Medicine) [name], MD as Consulting Physician (Gastroenterology) [name], MD as Consulting Physician (Ophthalmology) [name], [name], MD as Consulting Physician (Obstetrics and Gynecology) [name], MD as Consulting Physician (Hematology and Oncology) [name], MD as Consulting Physician (Radiation Oncology)  ASSESSMENT & PLAN:  Endometrial cancer [facility]) We reviewed plan of care We discussed treatment options again She is willing to proceed with treatment as scheduled We discussed risk, benefits, side effects of Taxotere and she agreed with the plan of care I recommend minimum 3 cycles of treatment before repeat imaging.  Anemia due to antineoplastic chemotherapy The pattern of anemia is not consistent with iron deficiency, more like anemia chronic illness likely secondary to active disease She does not need iron infusion or blood transfusion We will monitor closely while she is on treatment  Malignant cachexia ([place]) Her appetite has improved on dexamethasone She will continue the same for now I suspect her appetite will improve once chemotherapy is started  Goals of care, counseling/discussion We have brief discussion about goals of care The patient is aware that treatment goal is palliative   No orders of the defined types were placed in this encounter.   INTERVAL HISTORY: Please see below for problem oriented charting. 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 SEE COMMENT.  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 The greatest extent of invasive carcinoma as measured on the needle core biopsy measures 0.6 cm.       Endometrial cancer ([place])   02/03/2002 Pathology Results    1. BENIGN ENDOMETRIAL POLYPS.  2. UTERINE FIBROIDS: PORTIONS OF SMOOTH MUSCLE CONSISTENT WITH BENIGN LEIOMYOMA(S). PORTIONS OF BENIGN, CYSTICALLY ATROPHIC ENDOMETRIUM WITHOUT HYPERPLASIA OR EVIDENCE OF MALIGNANCY. 3. ENDOMETRIAL CURETTAGE: BENIGN ENDOMETRIUM AND SMOOTH MUSCLE.    09/07/2016 Pathology Results    PAP smear positive for malignant cells    09/07/2016 Initial Diagnosis    Patient had postmenopausal bleeding in 08-2016. She had PAP 09-07-16 by PCP Dr [name], which documented carcinoma. She had evaluation by Dr [name] with colposcopy/ ECC/endometrial biopsy documenting serous endometrioid endometrial carcinoma.    09/15/2016 Imaging    Ct imaging showed markedly thickened and heterogeneous endometrial stripe suspicious for endometrial carcinoma in this patient with postmenopausal vaginal bleeding. Cystic lesion in the right ovary cannot be definitively characterized. Pelvic ultrasound is recommended for further evaluation. Aortoiliac atherosclerosis. Avascular necrosis of the femoral heads bilaterally.    10/07/2016 Tumor Marker    Patient's tumor was tested for the following markers: CA125 Results of the tumor marker test revealed 15.1    10/13/2016 Pathology Results    1. Lymph node, sentinel, biopsy, right obturator - THERE IS NO EVIDENCE OF CARCINOMA IN 8 OF 8 LYMPH NODES (0/8). - SEE COMMENT. 2. Lymph node, sentinel, biopsy, left obturator - THERE IS NO  EVIDENCE OF CARCINOMA IN 1 OF 1 LYMPH NODE (0/1). - SEE  COMMENT. 3. Lymph node, sentinel, biopsy, left para-aortic - THERE IS NO EVIDENCE OF CARCINOMA IN 4 OF 4 LYMPH NODES (0/4). - SEE COMMENT. 4. Uterus +/- tubes/ovaries, neoplastic, cervix - INVASIVE MIXED SEROUS/ENDOMETRIOID ADENOCARCINOMA, MULTIPLE FOCI, FIGO GRADE III, THE LARGEST FOCUS SPANS 8.2 CM. - ADENOCARCINOMA EXTENDS INTO THE OUTER HALF OF THE MYOMETRIUM AND INVOLVES THE STROMA OF THE LOWER UTERINE SEGMENT AND CERVIX. - LYMPHOVASCULAR INVASION IS IDENTIFIED. - THE SURGICAL RESECTION MARGINS ARE NEGATIVE FOR CARCINOMA. - SEE ONCOLOGY TABLE BELOW. ADDITIONAL FINDINGS: - ENDOMETRIUM: ENDOMETRIOID TYPE POLYP(S), WHICH CONTAIN FOCI OF SIMILAR APPEARING MIXED SEROUS/ENDOMETRIOID ADENOCARCINOMA. - MYOMETRIUM: LEIOMYOMATA. - SEROSA: UNREMARKABLE. - BILATERAL ADNEXA: BENIGN OVARIES AND FALLOPIAN TUBES.    10/13/2016 Surgery    Surgery: Total robotic hysterectomy bilateral salpingo-oophorectomy, bilateral pelvic sentinel lymph node removal, left PA sentinel lymph node removal. Mini-laparotomy for specimen removal Surgeons:  [name], MD; [name], MD  Pathology:  1)Uterus, cervix, bilateral tubes and ovaries 2) Right obturator SLN 3) Left Obturator SLN 4) Left PA SLN Operative findings: 14 week fibroid uterus with one large dominant 6 cm myoma that was palpably calcified. 4 cm right ovarian cyst. + SLN identified in the bilateral obturator spaces, + SLN identified in the left PA space.     12/01/2016 - 05/31/2017 Chemotherapy    She received carboplatin & Taxol. She had 3 cycles of chemo followed by radiation and then 3 more cycles of chemo    01/11/2017 Genetic Testing    Testing was normal and did not reveal a mutation in these genes: The genes tested were the 80 genes on [employer]'s Multi-Cancer panel (ALK, APC, ATM, AXIN2, BAP1, BARD1, BLM, BMPR1A, BRCA1, BRCA2, BRIP1, CASR, CDC73, CDH1, CDK4, CDKN1B, CDKN1C, CDKN2A, CEBPA, CHEK2, DICER1, DIS3L2, EGFR, EPCAM, FH,  FLCN, GATA2, GPC3, GREM1, HOXB13,  HRAS, KIT, MAX, MEN1, MET, MITF, MLH1, MSH2, MSH6, MUTYH, NBN, NF1, NF2, PALB2, PDGFRA, PHOX2B, PMS2, POLD1, POLE, POT1, PRKAR1A, PTCH1, PTEN, RAD50, RAD51C, RAD51D, RB1, RECQL4, RET, RUNX1, SDHA, SDHAF2, SDHB, SDHC, SDHD, SMAD4, SMARCA4, SMARCB1, SMARCE1, STK11, SUFU, TERC, TERT, TMEM127, TP53, TSC1, TSC2, VHL, WRN, and WT1).    02/10/2017 - 04/12/2017 Radiation Therapy    02/10/17-03/16/17; 03/29/17-04/12/17;  1) Pelvis/ 45 Gy in 25 fractions  2) Vaginal Cuff/ 18 Gy in 3 fractions    08/26/2018 Imaging    Large destructive mass lesion left iliac bone with large soft tissue mass extending into the posterior soft tissues and pelvis compatible with metastatic disease. Consider follow-up CT chest abdomen pelvis with contrast for further staging.  Radiation changes at L5 and the sacrum. No lumbar metastatic deposits  Multilevel degenerative changes above.    08/29/2018 Imaging    1. 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 2. New irregular nodular opacity in the medial right middle lobe, new since 2017 CT. Separate subcentimeter right upper lobe solid pulmonary nodule, for which no comparison exists. These findings are indeterminate for pulmonary metastases and attention on follow-up chest CT in 3 months is recommended. 3. No lymphadenopathy or additional findings of metastatic disease.No tumor recurrence at the hysterectomy margin. 4. Aortic Atherosclerosis (ICD10-I70.0). Additional chronic findings as detailed.    08/29/2018 Tumor Marker    Patient's tumor was tested for the following markers: CA-125 Results of the tumor marker test revealed 17.4    09/06/2018 Procedure    CT-guided core biopsy performed of huge mass in the  left iliac fossa destroying the left iliac bone.    09/06/2018 Pathology Results    Soft Tissue Needle Core Biopsy, Left Iliac Fossa - POORLY DIFFERENTIATED MALIGNANT NEOPLASM. Microscopic  Comment The provided clinical history of both endometrial and breast cancer is noted. In the current case, immunohistochemistry for qualitative ER demonstrates scattered positive staining. CK8/18 is focally positive. CKAE1/3, CK7, CK20, TTF-1, CDX-2, GATA3 and PAX 8 are negative. The immunophenotype is not specific as to an origin for this poorly differentiated malignant neoplasm. The morphology is more suggestive of endometrial cancer than breast cancer, and may represent an unrecognized carcinosarcomatous component.    09/08/2018 - 09/27/2018 Radiation Therapy    She had palliative radiation treatment    09/29/2018 Procedure    Placement of single lumen port a cath via right internal jugular vein. The catheter tip lies at the cavo-atrial junction. A power injectable port a cath was placed and is ready for immediate use.    10/04/2018 - 01/17/2019 Chemotherapy    The patient had carboplatin and taxol x 6 cycles    12/05/2018 Tumor Marker    Patient's tumor was tested for the following markers: CA125 Results of the tumor marker test revealed 5.5    12/05/2018 Imaging    1. Considerable cystic degeneration in the large destructive mass of the left iliac bone. New or progressive left iliac bone pathologic fracture extending into the left SI joint, along with abnormal subluxation at the pubic symphysis indicating pubic symphysis instability. 2. Previous pleural-based nodularity in the right middle lobe is markedly improved, currently 4 mm in thickness and previously 11 mm in thickness. 3. Indistinct new wedge-shaped lesion in the left mid kidney, hypodense on portal venous phase images and hyperdense on delayed phase images, suggesting prolong tubular transit. This is a nonspecific finding but could reflect local inflammation, infection, or less likely infiltrative tumor. 4. Other imaging findings of potential clinical significance: Aortic Atherosclerosis (ICD10-I70.0). Left main coronary artery  atherosclerosis. 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    02/15/2019 Imaging    1. Slight progression of large expansile lytic lesion involving the left iliac bone. This demonstrates central low density, suggesting partial necrosis. This lesion was biopsied on 09/06/2018, revealing poorly differentiated malignant neoplasm. 2. New low-density lesion in the dome of the right hepatic lobe worrisome for cystic or treated metastasis. 3. No other evidence of metastatic disease. No explanation for the patient's symptoms. 4. 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     03/03/2019 - 04/07/2019 Anti-estrogen oral therapy    She cannot tolerate tamoxifen and megace    03/23/2019 Tumor Marker    Patient's tumor was tested for the following markers: CA125 Results of the tumor marker test revealed 3.8    04/13/2019 Imaging    1. 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 2. Mild decrease in size of large lytic bone metastasis and associated soft tissue mass involving the left ilium. 3. Unstable pathologic fracture of the left ilium, without significant change since prior exam. 4. New right lower lobe pulmonary metastases.      04/24/2019 -  Chemotherapy    The patient had DOCEtaxel (TAXOTERE) 130 mg in sodium chloride 0.9 % 250 mL chemo infusion, 75 mg/m2 = 130 mg, Intravenous,  Once, 0 of 4 cycles  for chemotherapy treatment.      Metastasis to bone ([place])  04/24/2019 -  Chemotherapy    The patient had DOCEtaxel (TAXOTERE) 130 mg in sodium chloride 0.9 % 250 mL chemo infusion, 75 mg/m2 = 130 mg, Intravenous,  Once, 0 of 4 cycles  for chemotherapy treatment.      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([place])   02/16/2019 Initial Diagnosis    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([place])    04/24/2019 -  Chemotherapy    The patient had DOCEtaxel (TAXOTERE) 130 mg in sodium  chloride 0.9 % 250 mL chemo infusion, 75 mg/m2 = 130 mg, Intravenous,  Once, 0 of 4 cycles  for chemotherapy treatment.      REVIEW OF SYSTEMS:   Constitutional: Denies fevers, chills or abnormal weight loss Eyes: Denies blurriness of vision Ears, nose, mouth, throat, and face: Denies mucositis or sore throat Respiratory: Denies cough, dyspnea or wheezes Cardiovascular: Denies palpitation, chest discomfort or lower extremity swelling Gastrointestinal:  Denies nausea, heartburn or change in bowel habits Skin: Denies abnormal skin rashes Lymphatics: Denies new lymphadenopathy or easy bruising Neurological:Denies numbness, tingling or new weaknesses Behavioral/Psych: Mood is stable, no new changes  All other systems were reviewed with the patient and are negative.  I have reviewed the past medical history, past surgical history, social history and family history with the patient and they are unchanged from previous note.  ALLERGIES:  is allergic to omnipaque [iohexol]; calan [verapamil]; effexor [venlafaxine]; erythromycin; femara [letrozole]; fosamax [alendronate]; ibuprofen; paxil [paroxetine hcl]; remeron [mirtazapine]; and vasotec [enalapril].  MEDICATIONS:  Current Outpatient Medications  Medication Sig Dispense Refill  . acetaminophen (TYLENOL) 500 MG tablet Take 500 mg by mouth daily as needed for moderate pain or headache.    Marland Kitchen aspirin 81 MG tablet Take 81 mg by mouth daily.      . Calcium Carbonate-Vitamin D (CALCIUM 600 + D PO) Take 1 tablet by mouth 2 (two) times daily.     . Cholecalciferol (VITAMIN D) 2000 units CAPS Take 2,000 Units by mouth every other day.    Marland Kitchen dexamethasone (DECADRON) 4 MG tablet Take 1 tablet (4 mg total) by mouth daily. 60 tablet 0  . furosemide (LASIX) 20 MG tablet Once daily for 3 days, repeat as needed for lower extremity edema 30 tablet 1  . levothyroxine (SYNTHROID, LEVOTHROID) 88 MCG tablet Take 1 tablet (88 mcg total) by mouth daily before  breakfast. 90 tablet 1  . loratadine-pseudoephedrine (CLARITIN-D 24 HOUR) 10-240 MG 24 hr tablet Take 1 tablet by mouth daily as needed for allergies.    . metFORMIN (GLUCOPHAGE-XR) 500 MG 24 hr tablet Take 1 tablet (500 mg total) by mouth 2 (two) times daily. (Patient taking differently: Take 500 mg by mouth 3 (three) times daily. ) 360 tablet 1  . Multiple Vitamin (MULTIVITAMIN WITH MINERALS) TABS tablet Take 1 tablet by mouth daily.    . ondansetron (ZOFRAN) 8 MG tablet Take 1 tablet (8 mg total) by mouth every 8 (eight) hours as needed for refractory nausea / vomiting. Start on day 3 after chemo. (Patient not taking: Reported on 03/21/2019) 30 tablet 1  . pravastatin (PRAVACHOL) 40 MG tablet Take 1 tablet (40 mg total) by mouth every evening. 90 tablet 1  . prochlorperazine (COMPAZINE) 10 MG tablet Take 1 tablet (10 mg total) by mouth every 6 (six) hours as needed (Nausea or vomiting). (Patient not taking: Reported on 03/21/2019) 30 tablet 1  . traMADol (ULTRAM) 50 MG tablet Take 1 tablet (50 mg total) by mouth 3 (three) times daily. Take 1/2 to 1 tablet every  4 hours as needed for severe Back Pain 90 tablet 0  . triamterene-hydrochlorothiazide (MAXZIDE-25) 37.5-25 MG tablet Take 1 tablet by mouth daily. Takes in the am     No current facility-administered medications for this visit.    Facility-Administered Medications Ordered in Other Visits  Medication Dose Route Frequency Provider Last Rate Last Dose  . sodium chloride flush (NS) 0.9 % injection 10 mL  10 mL Intracatheter PRN [name], MD   10 mL at 12/27/18 1825    PHYSICAL EXAMINATION: ECOG PERFORMANCE STATUS: 1 - Symptomatic but completely ambulatory Blood pressure 127/84 Heart rate 110 Respiration rate 18 Temperature 97.6 Weight 144 pounds GENERAL:alert, no distress and comfortable SKIN: skin color, texture, turgor are normal, no rashes or significant lesions EYES: normal, Conjunctiva are pink and non-injected, sclera  clear OROPHARYNX:no exudate, no erythema and lips, buccal mucosa, and tongue normal  NECK: supple, thyroid normal size, non-tender, without nodularity LYMPH:  no palpable lymphadenopathy in the cervical, axillary or inguinal LUNGS: clear to auscultation and percussion with normal breathing effort HEART: regular rate & rhythm and no murmurs and no lower extremity edema ABDOMEN:abdomen soft, non-tender and normal bowel sounds Musculoskeletal:no cyanosis of digits and no clubbing  NEURO: alert & oriented x 3 with fluent speech, no focal motor/sensory deficits  LABORATORY DATA:  I have reviewed the data as listed    Component Value Date/Time   NA 139 04/05/2019 1154   NA 137 05/31/2017 0931   K 3.5 04/05/2019 1154   K 3.5 05/31/2017 0931   CL 102 04/05/2019 1154   CL 99 12/20/2012 1336   CO2 27 04/05/2019 1154   CO2 25 05/31/2017 0931   GLUCOSE 94 04/05/2019 1154   GLUCOSE 161 (H) 05/31/2017 0931   GLUCOSE 110 (H) 12/20/2012 1336   BUN 13 04/05/2019 1154   BUN 10.9 05/31/2017 0931   CREATININE 0.73 04/05/2019 1154   CREATININE 0.63 07/05/2018 1009   CREATININE 0.8 05/31/2017 0931   CALCIUM 9.8 04/05/2019 1154   CALCIUM 10.6 (H) 05/31/2017 0931   PROT 7.3 04/05/2019 1154   PROT 7.5 05/31/2017 0931   ALBUMIN 3.0 (L) 04/05/2019 1154   ALBUMIN 4.2 05/31/2017 0931   AST 20 04/05/2019 1154   AST 16 05/31/2017 0931   ALT 13 04/05/2019 1154   ALT 15 05/31/2017 0931   ALKPHOS 75 04/05/2019 1154   ALKPHOS 91 05/31/2017 0931   BILITOT 0.3 04/05/2019 1154   BILITOT 0.33 05/31/2017 0931   GFRNONAA >60 04/05/2019 1154   GFRNONAA 87 07/05/2018 1009   GFRAA >60 04/05/2019 1154   GFRAA 100 07/05/2018 1009    No results found for: SPEP, UPEP  Lab Results  Component Value Date   WBC 9.5 04/24/2019   NEUTROABS 8.9 (H) 04/24/2019   HGB 11.0 (L) 04/24/2019   HCT 34.9 (L) 04/24/2019   MCV 79.3 (L) 04/24/2019   PLT 320 04/24/2019      Chemistry      Component Value Date/Time   NA  139 04/05/2019 1154   NA 137 05/31/2017 0931   K 3.5 04/05/2019 1154   K 3.5 05/31/2017 0931   CL 102 04/05/2019 1154   CL 99 12/20/2012 1336   CO2 27 04/05/2019 1154   CO2 25 05/31/2017 0931   BUN 13 04/05/2019 1154   BUN 10.9 05/31/2017 0931   CREATININE 0.73 04/05/2019 1154   CREATININE 0.63 07/05/2018 1009   CREATININE 0.8 05/31/2017 0931      Component Value Date/Time   CALCIUM  9.8 04/05/2019 1154   CALCIUM 10.6 (H) 05/31/2017 0931   ALKPHOS 75 04/05/2019 1154   ALKPHOS 91 05/31/2017 0931   AST 20 04/05/2019 1154   AST 16 05/31/2017 0931   ALT 13 04/05/2019 1154   ALT 15 05/31/2017 0931   BILITOT 0.3 04/05/2019 1154   BILITOT 0.33 05/31/2017 0931       RADIOGRAPHIC STUDIES: I have personally reviewed the radiological images as listed and agreed with the findings in the report. Ct Abdomen Pelvis W Contrast  Result Date: 04/13/2019 CLINICAL DATA:  Follow-up metastatic endometrial carcinoma. Previous surgery, chemotherapy, and radiation therapy. EXAM: CT ABDOMEN AND PELVIS WITH CONTRAST TECHNIQUE: Multidetector CT imaging of the abdomen and pelvis was performed using the standard protocol following bolus administration of intravenous contrast. CONTRAST:  16m OMNIPAQUE IOHEXOL 300 MG/ML  SOLN COMPARISON:  02/15/2019 FINDINGS: Lower Chest: 2 new pulmonary nodule seen in the right lower lobe, largest measuring 1.7 cm, consistent with pulmonary metastases. Hepatobiliary: Marked increase in size of heterogeneously enhancing mass in right hepatic lobe, currently measuring 12.9 x 9.8 cm, compared to 1.8 x 1.3 cm previously. No other liver masses identified. Gallbladder is unremarkable. No evidence of biliary ductal dilatation. Pancreas:  No mass or inflammatory changes. Spleen: Within normal limits in size and appearance. Adrenals/Urinary Tract: No masses identified. A few tiny renal cysts are again seen bilaterally. No evidence of hydronephrosis. Stomach/Bowel: No evidence of  obstruction, inflammatory process or abnormal fluid collections. Normal appendix visualized. Vascular/Lymphatic: No pathologically enlarged lymph nodes. No abdominal aortic aneurysm. Aortic atherosclerosis. Reproductive: Prior hysterectomy noted. Adnexal regions are unremarkable in appearance. Other:  None. 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 Associated soft tissue mass is mildly decreased in size, currently measuring 7.5 x 6.1 cm on image 55/2, compared to 8.1 x 7.3 cm previously. No other bone metastases identified. IMPRESSION: 1. 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 2. Mild decrease in size of large lytic bone metastasis and associated soft tissue mass involving the left ilium. 3. Unstable pathologic fracture of the left ilium, without significant change since prior exam. 4. New right lower lobe pulmonary metastases. Electronically Signed   By: JEarle GellM.D.   On: 04/13/2019 12:02    All questions were answered. The patient knows to call the clinic with any problems, questions or concerns. No barriers to learning was detected.  I spent 15 minutes counseling the patient face to face. The total time spent in the appointment was 20 minutes and more than 50% was on counseling and review of test results  NHeath Lark MD 04/24/2019 12:52 PM

## 2019-04-25 ENCOUNTER — Telehealth: Payer: Self-pay | Admitting: Hematology and Oncology

## 2019-04-25 NOTE — Telephone Encounter (Signed)
Left a message regarding schedule and I will mail

## 2019-04-27 ENCOUNTER — Telehealth: Payer: Self-pay | Admitting: Oncology

## 2019-04-27 NOTE — Telephone Encounter (Signed)
Called Tammy Hensley to see how she is feeling.  She said she is really not having any pain except for her right upper back on an off.  She said she thinks this is due to gas pain because it goes away one she "clears the gas."  She said she is beginning to eat OK.  She was having a hard time in the mornings getting going and has started a schedule where she gets up to eat.  She said she has felt better the last 2 mornings after doing this.  She also mentioned that she is not taking the steroids.

## 2019-05-01 ENCOUNTER — Inpatient Hospital Stay (HOSPITAL_COMMUNITY)
Admission: EM | Admit: 2019-05-01 | Discharge: 2019-05-03 | DRG: 641 | Disposition: A | Discharge status: Home / Self Care | Payer: Medicare Other | Attending: Internal Medicine | Admitting: Internal Medicine

## 2019-05-01 ENCOUNTER — Other Ambulatory Visit: Payer: Medicare Other

## 2019-05-01 ENCOUNTER — Other Ambulatory Visit (HOSPITAL_COMMUNITY): Payer: Self-pay

## 2019-05-01 ENCOUNTER — Ambulatory Visit: Payer: Medicare Other

## 2019-05-01 ENCOUNTER — Other Ambulatory Visit: Payer: Self-pay

## 2019-05-01 ENCOUNTER — Emergency Department (HOSPITAL_COMMUNITY): Payer: Medicare Other

## 2019-05-01 ENCOUNTER — Ambulatory Visit: Payer: Medicare Other | Admitting: Hematology and Oncology

## 2019-05-01 ENCOUNTER — Telehealth: Payer: Self-pay

## 2019-05-01 ENCOUNTER — Encounter (HOSPITAL_COMMUNITY): Payer: Self-pay

## 2019-05-01 ENCOUNTER — Telehealth: Payer: Self-pay | Admitting: Oncology

## 2019-05-01 DIAGNOSIS — K1231 Oral mucositis (ulcerative) due to antineoplastic therapy: Secondary | ICD-10-CM | POA: Diagnosis present

## 2019-05-01 DIAGNOSIS — D630 Anemia in neoplastic disease: Secondary | ICD-10-CM | POA: Diagnosis present

## 2019-05-01 DIAGNOSIS — T451X5A Adverse effect of antineoplastic and immunosuppressive drugs, initial encounter: Secondary | ICD-10-CM | POA: Diagnosis present

## 2019-05-01 DIAGNOSIS — K5900 Constipation, unspecified: Secondary | ICD-10-CM | POA: Diagnosis present

## 2019-05-01 DIAGNOSIS — E039 Hypothyroidism, unspecified: Secondary | ICD-10-CM | POA: Diagnosis present

## 2019-05-01 DIAGNOSIS — Z20828 Contact with and (suspected) exposure to other viral communicable diseases: Secondary | ICD-10-CM | POA: Diagnosis present

## 2019-05-01 DIAGNOSIS — Z6826 Body mass index (BMI) 26.0-26.9, adult: Secondary | ICD-10-CM

## 2019-05-01 DIAGNOSIS — R531 Weakness: Secondary | ICD-10-CM | POA: Diagnosis not present

## 2019-05-01 DIAGNOSIS — E785 Hyperlipidemia, unspecified: Secondary | ICD-10-CM | POA: Diagnosis present

## 2019-05-01 DIAGNOSIS — F329 Major depressive disorder, single episode, unspecified: Secondary | ICD-10-CM | POA: Diagnosis present

## 2019-05-01 DIAGNOSIS — C7951 Secondary malignant neoplasm of bone: Secondary | ICD-10-CM | POA: Diagnosis present

## 2019-05-01 DIAGNOSIS — Z8249 Family history of ischemic heart disease and other diseases of the circulatory system: Secondary | ICD-10-CM

## 2019-05-01 DIAGNOSIS — D638 Anemia in other chronic diseases classified elsewhere: Secondary | ICD-10-CM | POA: Diagnosis present

## 2019-05-01 DIAGNOSIS — D6481 Anemia due to antineoplastic chemotherapy: Secondary | ICD-10-CM | POA: Diagnosis present

## 2019-05-01 DIAGNOSIS — D701 Agranulocytosis secondary to cancer chemotherapy: Secondary | ICD-10-CM | POA: Diagnosis present

## 2019-05-01 DIAGNOSIS — I10 Essential (primary) hypertension: Secondary | ICD-10-CM | POA: Diagnosis present

## 2019-05-01 DIAGNOSIS — Z79890 Hormone replacement therapy (postmenopausal): Secondary | ICD-10-CM

## 2019-05-01 DIAGNOSIS — E860 Dehydration: Principal | ICD-10-CM | POA: Diagnosis present

## 2019-05-01 DIAGNOSIS — R64 Cachexia: Secondary | ICD-10-CM | POA: Diagnosis present

## 2019-05-01 DIAGNOSIS — C7800 Secondary malignant neoplasm of unspecified lung: Secondary | ICD-10-CM | POA: Diagnosis present

## 2019-05-01 DIAGNOSIS — D708 Other neutropenia: Secondary | ICD-10-CM | POA: Diagnosis not present

## 2019-05-01 DIAGNOSIS — C787 Secondary malignant neoplasm of liver and intrahepatic bile duct: Secondary | ICD-10-CM | POA: Diagnosis present

## 2019-05-01 DIAGNOSIS — D709 Neutropenia, unspecified: Secondary | ICD-10-CM | POA: Diagnosis present

## 2019-05-01 DIAGNOSIS — C50412 Malignant neoplasm of upper-outer quadrant of left female breast: Secondary | ICD-10-CM | POA: Diagnosis present

## 2019-05-01 DIAGNOSIS — E876 Hypokalemia: Secondary | ICD-10-CM | POA: Diagnosis present

## 2019-05-01 DIAGNOSIS — Z90710 Acquired absence of both cervix and uterus: Secondary | ICD-10-CM

## 2019-05-01 DIAGNOSIS — C541 Malignant neoplasm of endometrium: Secondary | ICD-10-CM | POA: Diagnosis not present

## 2019-05-01 DIAGNOSIS — Z7982 Long term (current) use of aspirin: Secondary | ICD-10-CM

## 2019-05-01 DIAGNOSIS — Z79899 Other long term (current) drug therapy: Secondary | ICD-10-CM

## 2019-05-01 DIAGNOSIS — Z66 Do not resuscitate: Secondary | ICD-10-CM | POA: Diagnosis present

## 2019-05-01 DIAGNOSIS — Z888 Allergy status to other drugs, medicaments and biological substances status: Secondary | ICD-10-CM

## 2019-05-01 DIAGNOSIS — E1165 Type 2 diabetes mellitus with hyperglycemia: Secondary | ICD-10-CM | POA: Diagnosis present

## 2019-05-01 DIAGNOSIS — K5909 Other constipation: Secondary | ICD-10-CM | POA: Diagnosis present

## 2019-05-01 DIAGNOSIS — Z923 Personal history of irradiation: Secondary | ICD-10-CM

## 2019-05-01 DIAGNOSIS — K219 Gastro-esophageal reflux disease without esophagitis: Secondary | ICD-10-CM | POA: Diagnosis present

## 2019-05-01 DIAGNOSIS — R197 Diarrhea, unspecified: Secondary | ICD-10-CM | POA: Diagnosis present

## 2019-05-01 DIAGNOSIS — Z803 Family history of malignant neoplasm of breast: Secondary | ICD-10-CM

## 2019-05-01 DIAGNOSIS — Z170 Estrogen receptor positive status [ER+]: Secondary | ICD-10-CM

## 2019-05-01 DIAGNOSIS — C50912 Malignant neoplasm of unspecified site of left female breast: Secondary | ICD-10-CM | POA: Diagnosis present

## 2019-05-01 LAB — URINALYSIS, ROUTINE W REFLEX MICROSCOPIC
Bilirubin Urine: NEGATIVE
Glucose, UA: NEGATIVE mg/dL
Hgb urine dipstick: NEGATIVE
Ketones, ur: 5 mg/dL — AB
Leukocytes,Ua: NEGATIVE
Nitrite: NEGATIVE
Protein, ur: NEGATIVE mg/dL
Specific Gravity, Urine: 1.005 (ref 1.005–1.030)
pH: 7 (ref 5.0–8.0)

## 2019-05-01 LAB — BASIC METABOLIC PANEL
Anion gap: 10 (ref 5–15)
BUN: 9 mg/dL (ref 8–23)
CO2: 30 mmol/L (ref 22–32)
Calcium: 8.6 mg/dL — ABNORMAL LOW (ref 8.9–10.3)
Chloride: 92 mmol/L — ABNORMAL LOW (ref 98–111)
Creatinine, Ser: 0.65 mg/dL (ref 0.44–1.00)
GFR calc Af Amer: >60 mL/min (ref >60)
GFR calc non Af Amer: >60 mL/min (ref >60)
Glucose, Bld: 137 mg/dL — ABNORMAL HIGH (ref 70–99)
Potassium: 2.8 mmol/L — ABNORMAL LOW (ref 3.5–5.1)
Sodium: 132 mmol/L — ABNORMAL LOW (ref 135–145)

## 2019-05-01 LAB — DIFFERENTIAL
Basophils Absolute: 0 10*3/uL (ref 0.0–0.1)
Basophils Relative: 2 %
Eosinophils Absolute: 0 10*3/uL (ref 0.0–0.5)
Eosinophils Relative: 2 %
Lymphocytes Relative: 46 %
Lymphs Abs: 0.2 10*3/uL — ABNORMAL LOW (ref 0.7–4.0)
Monocytes Absolute: 0.1 10*3/uL (ref 0.1–1.0)
Monocytes Relative: 29 %
Neutro Abs: 0.1 10*3/uL — ABNORMAL LOW (ref 1.7–7.7)
Neutrophils Relative %: 17 %

## 2019-05-01 LAB — CBC
HCT: 29.4 % — ABNORMAL LOW (ref 36.0–46.0)
Hemoglobin: 9.5 g/dL — ABNORMAL LOW (ref 12.0–15.0)
MCH: 25.3 pg — ABNORMAL LOW (ref 26.0–34.0)
MCHC: 32.3 g/dL (ref 30.0–36.0)
MCV: 78.2 fL — ABNORMAL LOW (ref 80.0–100.0)
Platelets: 260 10*3/uL (ref 150–400)
RBC: 3.76 MIL/uL — ABNORMAL LOW (ref 3.87–5.11)
RDW: 19.9 % — ABNORMAL HIGH (ref 11.5–15.5)
WBC: 0.4 10*3/uL — CL (ref 4.0–10.5)
nRBC: 8.1 % — ABNORMAL HIGH (ref 0.0–0.2)

## 2019-05-01 LAB — SARS CORONAVIRUS 2 BY RT PCR (HOSPITAL ORDER, PERFORMED IN ~~LOC~~ HOSPITAL LAB): SARS Coronavirus 2: NEGATIVE

## 2019-05-01 LAB — HEPATIC FUNCTION PANEL
ALT: 28 U/L (ref 0–44)
AST: 44 U/L — ABNORMAL HIGH (ref 15–41)
Albumin: 3 g/dL — ABNORMAL LOW (ref 3.5–5.0)
Alkaline Phosphatase: 119 U/L (ref 38–126)
Bilirubin, Direct: 0.3 mg/dL — ABNORMAL HIGH (ref 0.0–0.2)
Indirect Bilirubin: 0.5 mg/dL (ref 0.3–0.9)
Total Bilirubin: 0.8 mg/dL (ref 0.3–1.2)
Total Protein: 5.6 g/dL — ABNORMAL LOW (ref 6.5–8.1)

## 2019-05-01 LAB — TYPE AND SCREEN
ABO/RH(D): A POS
Antibody Screen: NEGATIVE

## 2019-05-01 LAB — CBG MONITORING, ED: Glucose-Capillary: 131 mg/dL — ABNORMAL HIGH (ref 70–99)

## 2019-05-01 LAB — GLUCOSE, CAPILLARY: Glucose-Capillary: 133 mg/dL — ABNORMAL HIGH (ref 70–99)

## 2019-05-01 LAB — MAGNESIUM: Magnesium: 1.9 mg/dL (ref 1.7–2.4)

## 2019-05-01 MED ORDER — POTASSIUM CHLORIDE 10 MEQ/100ML IV SOLN
10.0000 meq | Freq: Once | INTRAVENOUS | Status: AC
  Administered 2019-05-01: 18:00:00 10 meq via INTRAVENOUS
  Filled 2019-05-01: qty 100

## 2019-05-01 MED ORDER — SIMETHICONE 40 MG/0.6ML PO SUSP
20.0000 mg | Freq: Four times a day (QID) | ORAL | Status: DC | PRN
  Filled 2019-05-01: qty 0.6

## 2019-05-01 MED ORDER — LEVOTHYROXINE SODIUM 100 MCG/5ML IV SOLN: 22.0000 ug | INTRAVENOUS | Status: DC

## 2019-05-01 MED ORDER — ACETAMINOPHEN 650 MG RE SUPP: 650.0000 mg | Freq: Four times a day (QID) | RECTAL | Status: DC | PRN

## 2019-05-01 MED ORDER — PANTOPRAZOLE SODIUM 40 MG IV SOLR
40.0000 mg | Freq: Two times a day (BID) | INTRAVENOUS | Status: DC
  Administered 2019-05-01 – 2019-05-02 (×2): 40 mg via INTRAVENOUS
  Filled 2019-05-01 (×2): qty 40

## 2019-05-01 MED ORDER — KCL IN DEXTROSE-NACL 20-5-0.2 MEQ/L-%-% IV SOLN: INTRAVENOUS | Status: DC

## 2019-05-01 MED ORDER — HYDRALAZINE HCL 20 MG/ML IJ SOLN: 10.0000 mg | Freq: Four times a day (QID) | INTRAMUSCULAR | Status: DC | PRN

## 2019-05-01 MED ORDER — LEVOTHYROXINE SODIUM 100 MCG/5ML IV SOLN
44.0000 ug | INTRAVENOUS | Status: DC
  Administered 2019-05-02: 44 ug via INTRAVENOUS
  Filled 2019-05-01: qty 5

## 2019-05-01 MED ORDER — HYDROCODONE-ACETAMINOPHEN 5-325 MG PO TABS
1.0000 | ORAL_TABLET | Freq: Once | ORAL | Status: AC
  Administered 2019-05-01: 14:00:00 1 via ORAL
  Filled 2019-05-01: qty 1

## 2019-05-01 MED ORDER — SODIUM CHLORIDE 0.9 % IV BOLUS
1000.0000 mL | Freq: Once | INTRAVENOUS | Status: AC
  Administered 2019-05-01: 1000 mL via INTRAVENOUS

## 2019-05-01 MED ORDER — ONDANSETRON HCL 4 MG/2ML IJ SOLN: 4.0000 mg | INTRAMUSCULAR | Status: DC | PRN

## 2019-05-01 MED ORDER — HYDROCODONE-ACETAMINOPHEN 5-325 MG PO TABS
1.0000 | ORAL_TABLET | ORAL | Status: DC | PRN
  Administered 2019-05-01 – 2019-05-02 (×3): 1 via ORAL
  Administered 2019-05-02: 2 via ORAL
  Filled 2019-05-01: qty 1
  Filled 2019-05-01: qty 2
  Filled 2019-05-01 (×2): qty 1

## 2019-05-01 MED ORDER — ACETAMINOPHEN 325 MG PO TABS: 650.0000 mg | ORAL_TABLET | Freq: Four times a day (QID) | ORAL | Status: DC | PRN

## 2019-05-01 MED ORDER — INSULIN ASPART 100 UNIT/ML ~~LOC~~ SOLN
0.0000 [IU] | Freq: Three times a day (TID) | SUBCUTANEOUS | Status: DC
  Administered 2019-05-02: 2 [IU] via SUBCUTANEOUS
  Administered 2019-05-02: 3 [IU] via SUBCUTANEOUS
  Administered 2019-05-02: 1 [IU] via SUBCUTANEOUS
  Administered 2019-05-03: 2 [IU] via SUBCUTANEOUS

## 2019-05-01 MED ORDER — POTASSIUM CHLORIDE 10 MEQ/100ML IV SOLN
10.0000 meq | INTRAVENOUS | Status: AC
  Administered 2019-05-01 (×2): 10 meq via INTRAVENOUS
  Filled 2019-05-01 (×2): qty 100

## 2019-05-01 MED ORDER — SODIUM CHLORIDE 0.9% FLUSH
3.0000 mL | Freq: Once | INTRAVENOUS | Status: AC
  Administered 2019-05-01: 13:00:00 3 mL via INTRAVENOUS

## 2019-05-01 MED ORDER — DOCUSATE SODIUM 100 MG PO CAPS
100.0000 mg | ORAL_CAPSULE | Freq: Two times a day (BID) | ORAL | Status: DC
  Administered 2019-05-01 – 2019-05-02 (×3): 100 mg via ORAL
  Filled 2019-05-01 (×4): qty 1

## 2019-05-01 MED ORDER — MORPHINE SULFATE (PF) 2 MG/ML IV SOLN: 1.0000 mg | INTRAVENOUS | Status: DC | PRN

## 2019-05-01 MED ORDER — INSULIN ASPART 100 UNIT/ML ~~LOC~~ SOLN: 0.0000 [IU] | Freq: Every day | SUBCUTANEOUS | Status: DC

## 2019-05-01 MED ORDER — MAGIC MOUTHWASH W/LIDOCAINE
5.0000 mL | Freq: Four times a day (QID) | ORAL | Status: DC
  Administered 2019-05-01 – 2019-05-02 (×3): 5 mL via ORAL
  Filled 2019-05-01 (×8): qty 5

## 2019-05-01 MED ORDER — DEXAMETHASONE SODIUM PHOSPHATE 10 MG/ML IJ SOLN
4.0000 mg | INTRAMUSCULAR | Status: DC
  Administered 2019-05-01: 4 mg via INTRAVENOUS
  Filled 2019-05-01 (×2): qty 0.4

## 2019-05-01 MED ORDER — POLYETHYLENE GLYCOL 3350 17 G PO PACK: 17.0000 g | PACK | Freq: Every day | ORAL | Status: DC | PRN

## 2019-05-01 MED ORDER — SODIUM CHLORIDE 0.9% FLUSH
10.0000 mL | INTRAVENOUS | Status: DC | PRN
  Administered 2019-05-03 (×2): 10 mL
  Filled 2019-05-01 (×2): qty 40

## 2019-05-01 MED ORDER — KCL IN DEXTROSE-NACL 20-5-0.45 MEQ/L-%-% IV SOLN
INTRAVENOUS | Status: DC
  Administered 2019-05-01 – 2019-05-02 (×3): via INTRAVENOUS
  Filled 2019-05-01 (×4): qty 1000

## 2019-05-01 MED ORDER — HEPARIN SODIUM (PORCINE) 5000 UNIT/ML IJ SOLN
5000.0000 [IU] | Freq: Three times a day (TID) | INTRAMUSCULAR | Status: DC
  Administered 2019-05-01 – 2019-05-03 (×5): 5000 [IU] via SUBCUTANEOUS
  Filled 2019-05-01 (×5): qty 1

## 2019-05-01 NOTE — Telephone Encounter (Signed)
I can see her at 12 pm Please get labs appt before I see her

## 2019-05-01 NOTE — ED Notes (Signed)
[place], MD at bedside.

## 2019-05-01 NOTE — Telephone Encounter (Signed)
Tammy Hensley called and said she is not doing well. She also continues to have gas pain (feels like she needs to belch) on and off.  She also has mouth sores which started Saturday evening and is having pain with swallowing.  She has not been able to eat or drink very much. She also fell last night.  She was sitting on the toilet and fell forward and hit her head.  She does not remember feeling dizzy before she fell.  She is wondering if she needs to be seen and said her son is on his way to help her.

## 2019-05-01 NOTE — ED Notes (Signed)
Attempted to call report. Secretary will have RN call this RN back.

## 2019-05-01 NOTE — Telephone Encounter (Signed)
Tammy Hensley, son called and left a message to call him.  Called son back. His Dad called 911. His Mom had no energy and just could not get ready. Tammy Hensley is heading to [facility] now and is concerned about the no visitor policy.

## 2019-05-01 NOTE — Telephone Encounter (Signed)
Called Eilidh back and scheduled labs for 10:45, port flush at 11:00 and see Dr. Alvy Bimler at 75.  She verbalized agreement.

## 2019-05-01 NOTE — ED Notes (Signed)
Bed: WA01 Expected date:  Expected time:  Means of arrival:  Comments: EMS [place] pt- weakness, sore throat, unable to eat/drink

## 2019-05-01 NOTE — H&P (Signed)
History and Physical        Hospital Admission Note Date: 05/01/2019  Patient name: Tammy Hensley Medical record number: [id number] Date of birth: 02-08-1941 Age: 78 y.o. Gender: female  PCP: [name], MD    Patient coming from: HOME   I have reviewed all records in the [facility].    Chief Complaint:  Diarrhea, worsening generalized weakness, throat pain, decreased p.o. 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  History was obtained from the patient who reported that she received chemotherapy on 6/1 without any issues.  Next day on 6/2,  she started feeling weak, gas pains, belching off and on and diarrhea for 2 days.  3 days ago, she had difficulty swallowing, felt pain in the throat, unable to eat or drink very much.  Last night she felt significantly weak and fell forward while sitting on the toilet and hit her head.  Patient denied any chest pain, shortness of breath or palpitations.   COVID-19 test pending  ED work-up/course:   Review of Systems: Positives marked in 'bold' Constitutional: Denies fever, chills, diaphoresis,+ poor appetite and fatigue.  HEENT: + mouth sores, trouble swallowing,  Respiratory: Denies cough, chest tightness,  and wheezing. Cardiovascular: Denies chest pain, palpitations and leg swelling.  Gastrointestinal: Please see HPI Genitourinary: Denies dysuria, urgency, frequency, hematuria, flank pain and difficulty urinating.  Musculoskeletal: Denies myalgias,  back pain, joint swelling, arthralgias and gait problem.  Skin: Denies pallor, rash and wound.  Neurological: Please see HPI Hematological: Denies adenopathy. Easy bruising, personal or family bleeding history  Psychiatric/Behavioral: Denies suicidal ideation, mood changes, confusion, nervousness, sleep disturbance and agitation  Past Medical History: Past Medical History:  Diagnosis Date   Avascular necrosis of bones of both hips ([place]) 11/17/2016   Breast cancer ([place]) 2006   left breast   Depression    Diabetes ([place])    borderline   Endometrial ca ([place]) 2018   Family history of cancer    History of brachytherapy 03/29/17-04/12/17   vaginal cuff 18 Gy in 3 fractions   History of radiation therapy 02/10/17-03/16/17   pelvis 45 Gy in 25 fractions   Hyperlipidemia    Hypertension    Iron deficiency anemia    Metastatic 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 ([place]) 2019   left hip   Vitamin D deficiency     Past Surgical History:  Procedure Laterality Date   BREAST LUMPECTOMY Left 2006   BREAST SURGERY  2006   left breast lumpectomy- radiation, took Femara    IR IMAGING GUIDED PORT INSERTION  09/29/2018   perineum  1973   correction  of vaginal and rectal area from birth -did not have episiotomy from first birth   ROBOTIC ASSISTED LAP VAGINAL HYSTERECTOMY N/A 10/13/2016   Procedure: XI ROBOTIC [place];  Surgeon: Nancy Marus, MD;  Location: [facility];  Service: Gynecology;  Laterality: N/A;   ROBOTIC ASSISTED SALPINGO OOPHERECTOMY Bilateral 10/13/2016   Procedure: XI ROBOTIC ASSISTED SALPINGO OOPHORECTOMY;  Surgeon: Nancy Marus, MD;  Location: [facility];  Service: Gynecology;  Laterality: Bilateral;   SENTINEL NODE BIOPSY N/A 10/13/2016   Procedure: SENTINEL NODE BIOPSY;  Surgeon: Nancy Marus, MD;  Location: [facility];  Service: Gynecology;  Laterality: N/A;    Medications: Prior to Admission medications   Medication Sig Start Date End Date Taking?  Authorizing Provider  acetaminophen (TYLENOL) 500 MG tablet Take 500 mg by mouth daily as needed for moderate pain or headache.    [provider]  aspirin 81 MG tablet Take 81 mg by mouth daily.      [provider]  Calcium Carbonate-Vitamin D (CALCIUM 600 + D PO) Take 1 tablet by mouth 2 (two) times daily.     [provider]  Cholecalciferol (VITAMIN D) 2000 units CAPS Take 2,000 Units by mouth every other day.    [provider]  dexamethasone (DECADRON) 4 MG tablet Take 1 tablet (4 mg total) by mouth daily. 04/07/19   [name], MD  furosemide (LASIX) 20 MG tablet Once daily for 3 days, repeat as needed for lower extremity edema 10/18/18   Harle Stanford., PA-C  levothyroxine (SYNTHROID, LEVOTHROID) 88 MCG tablet Take 1 tablet (88 mcg total) by mouth daily before breakfast. 05/04/17   [name], MD  loratadine-pseudoephedrine (CLARITIN-D 24 HOUR) 10-240 MG 24 hr tablet Take 1 tablet by mouth daily as needed for allergies.    [provider]  metFORMIN (GLUCOPHAGE-XR) 500 MG 24 hr tablet Take 1 tablet (500 mg total) by mouth 2 (two) times daily. Patient taking differently: Take 500 mg by mouth 3 (three) times daily.  12/06/18   [name], MD  Multiple Vitamin (MULTIVITAMIN WITH MINERALS) TABS tablet Take 1 tablet by mouth daily.    [provider]  ondansetron (ZOFRAN) 8 MG tablet Take 1 tablet (8 mg total) by mouth every 8 (eight) hours as needed for refractory nausea / vomiting. Start on day 3 after chemo. Patient not taking: Reported on 03/21/2019 09/15/18   [name], MD  pravastatin (PRAVACHOL) 40 MG tablet Take 1 tablet (40 mg total) by mouth every evening. 03/02/18   [name], MD  prochlorperazine (COMPAZINE) 10 MG tablet Take 1 tablet (10 mg total) by mouth every 6 (six) hours as needed (Nausea or vomiting). Patient not taking: Reported on 03/21/2019 09/15/18   [name], MD  traMADol (ULTRAM) 50 MG tablet Take 1  tablet (50 mg total) by mouth 3 (three) times daily. Take 1/2 to 1 tablet every 4 hours as needed for severe Back Pain 02/20/19   [name], MD  triamterene-hydrochlorothiazide (MAXZIDE-25) 37.5-25 MG tablet Take 1 tablet by mouth daily. Takes in the am    [provider]    Allergies:   Allergies  Allergen Reactions   Omnipaque [Iohexol] Hives    Pt will need 13 hr prep in the future--SLG   Calan [Verapamil]     Constipation    Effexor [Venlafaxine]     unknown   Erythromycin Nausea And Vomiting   Femara [Letrozole] Swelling   Fosamax [Alendronate]     aching   Ibuprofen     Dizziness, incoherent    Paxil [Paroxetine Hcl] Nausea Only   Remeron [Mirtazapine]     Weight gain    Vasotec [Enalapril] Cough    Social History:  reports that she has never  smoked. She has never used smokeless tobacco. She reports that she does not drink alcohol or use drugs.  Family History: Family History  Problem Relation Age of Onset   Heart disease Mother    Heart attack Mother    Cancer Father    Heart attack Father    Prostate cancer Father 26       deceased 38   Breast cancer Paternal Aunt 37       deceased 64s   Prostate cancer Paternal Uncle 68       deceased 7   Cancer Paternal Grandmother        unsure; deceased 67s   Prostate cancer Brother 51       currently 27   Prostate cancer Brother 12       currently 43    Physical Exam: Blood pressure 140/72, pulse 85, temperature 99 F (37.2 C), temperature source Oral, resp. rate 18, SpO2 99 %. General: Alert, awake, oriented x3, in no acute distress. Eyes: pink conjunctiva,anicteric sclera, pupils equal and reactive to light and accomodation, HEENT: normocephalic, atraumatic, no obvious oral thrush Neck: supple, no masses or lymphadenopathy, no goiter, no bruits, no JVD CVS: Regular rate and rhythm, without murmurs, rubs or gallops. No lower extremity edema.  Resp : Clear to auscultation  bilaterally, no wheezing, rales or rhonchi.+ Port in the right chest wall GI : Soft, nontender, nondistended, positive bowel sounds Musculoskeletal: No clubbing or cyanosis, positive pedal pulses. No contracture. ROM intact  Neuro: Grossly intact, no focal neurological deficits, strength 5/5 upper and lower extremities bilaterally Psych: alert and oriented x 3, normal mood and affect Skin: no rashes or lesions, warm and dry   LABS on Admission: I have personally reviewed all the labs and imagings below    Basic Metabolic Panel: Recent Labs  Lab 05/01/19 1321  NA 132*  K 2.8*  CL 92*  CO2 30  GLUCOSE 137*  BUN 9  CREATININE 0.65  CALCIUM 8.6*   Liver Function Tests: Recent Labs  Lab 05/01/19 1321  AST 44*  ALT 28  ALKPHOS 119  BILITOT 0.8  PROT 5.6*  ALBUMIN 3.0*   No results for input(s): LIPASE, AMYLASE in the last 168 hours. No results for input(s): AMMONIA in the last 168 hours. CBC: Recent Labs  Lab 05/01/19 1321  WBC 0.4*  HGB 9.5*  HCT 29.4*  MCV 78.2*  PLT 260   Cardiac Enzymes: No results for input(s): CKTOTAL, CKMB, CKMBINDEX, TROPONINI in the last 168 hours. BNP: Invalid input(s): POCBNP CBG: Recent Labs  Lab 05/01/19 1319  GLUCAP 131*    Radiological Exams on Admission:  Ct Head Wo Contrast  Result Date: 05/01/2019 CLINICAL DATA:  Posttraumatic headache after fall today. EXAM: CT HEAD WITHOUT CONTRAST CT CERVICAL SPINE WITHOUT CONTRAST TECHNIQUE: Multidetector CT imaging of the head and cervical spine was performed following the standard protocol without intravenous contrast. Multiplanar CT image reconstructions of the cervical spine were also generated. COMPARISON:  None. FINDINGS: CT HEAD FINDINGS Brain: Mild diffuse cortical atrophy is noted. Mild chronic ischemic white matter disease is noted. No mass effect or midline shift is noted. Ventricular size is within normal limits. There is no evidence of mass lesion, hemorrhage or acute infarction.  Vascular: No hyperdense vessel or unexpected calcification. Skull: Normal. Negative for fracture or focal lesion. Sinuses/Orbits: No acute finding. Other: None. CT CERVICAL SPINE FINDINGS Alignment: Minimal grade 1 anterolisthesis of C4-5 is noted secondary to posterior facet joint hypertrophy. Skull base and  vertebrae: No acute fracture. No primary bone lesion or focal pathologic process. Soft tissues and spinal canal: No prevertebral fluid or swelling. No visible canal hematoma. Disc levels: Mild degenerative disc disease is noted at C5-6 with anterior posterior osteophyte formation. There appears to be fusion of the C6-7 disc space most likely due to degenerative change. Upper chest: Negative. Other: Degenerative changes are seen involving posterior facet joints bilaterally. IMPRESSION: Mild diffuse cortical atrophy. Mild chronic ischemic white matter disease. No acute intracranial abnormality seen. Multilevel degenerative disc disease. No acute abnormality seen in the cervical spine. Electronically Signed   By: Marijo Conception M.D.   On: 05/01/2019 15:17   Ct Cervical Spine Wo Contrast  Result Date: 05/01/2019 CLINICAL DATA:  Posttraumatic headache after fall today. EXAM: CT HEAD WITHOUT CONTRAST CT CERVICAL SPINE WITHOUT CONTRAST TECHNIQUE: Multidetector CT imaging of the head and cervical spine was performed following the standard protocol without intravenous contrast. Multiplanar CT image reconstructions of the cervical spine were also generated. COMPARISON:  None. FINDINGS: CT HEAD FINDINGS Brain: Mild diffuse cortical atrophy is noted. Mild chronic ischemic white matter disease is noted. No mass effect or midline shift is noted. Ventricular size is within normal limits. There is no evidence of mass lesion, hemorrhage or acute infarction. Vascular: No hyperdense vessel or unexpected calcification. Skull: Normal. Negative for fracture or focal lesion. Sinuses/Orbits: No acute finding. Other: None. CT  CERVICAL SPINE FINDINGS Alignment: Minimal grade 1 anterolisthesis of C4-5 is noted secondary to posterior facet joint hypertrophy. Skull base and vertebrae: No acute fracture. No primary bone lesion or focal pathologic process. Soft tissues and spinal canal: No prevertebral fluid or swelling. No visible canal hematoma. Disc levels: Mild degenerative disc disease is noted at C5-6 with anterior posterior osteophyte formation. There appears to be fusion of the C6-7 disc space most likely due to degenerative change. Upper chest: Negative. Other: Degenerative changes are seen involving posterior facet joints bilaterally. IMPRESSION: Mild diffuse cortical atrophy. Mild chronic ischemic white matter disease. No acute intracranial abnormality seen. Multilevel degenerative disc disease. No acute abnormality seen in the cervical spine. Electronically Signed   By: Marijo Conception M.D.   On: 05/01/2019 15:17      EKG: Independently reviewed.  Rate 100, sinus tachycardia   Assessment/Plan Principal Problem:   Neutropenia ([place]) in the setting of new chemotherapy started on 04/24/2019, endometrial cancer, metastatic to bones and liver -Admit to observation, telemetry, IV fluid hydration -Obtain blood cultures, UA and culture, COVID-19 test pending.  -Discussed in detail with patient's oncologist, Dr. Alvy Bimler, recommended to hold off on antibiotics unless patient spikes fevers -Follow CBC with differential in a.m. -Per patient diarrhea has resolved.   Active Problems: Dehydration, -Patient has not been able to eat much in the last 1 week due to odynophagia -Placed on clear liquid diet, Magic mouthwash with lidocaine scheduled 4 times daily -Placed on aggressive IV fluid hydration with potassium supplementation  Hypokalemia, likely due to diarrhea -Replaced IV, follow magnesium level  Hypothyroidism -Placed on IV Synthroid, custom dosing with pharmacy assistance  GERD -Placed on IV PPI,  simethicone  Anemia: Microcytic likely due to anemia of chronic disease and chemotherapy -Hemoglobin 9.5, was 11.5 on 6/1 -No hematemesis, hematochezia or melena -H&H currently stable  Hypertension -Will place on hydralazine IV with parameters  Stage IV endometrial CA with metastasis to bones and liver, started on chemotherapy on 6/1 - will continue IV Decadron, pain control with IV morphine as needed -Oncology consulted, Dr Alvy Bimler  to follow in am -PT OT evaluation prior to discharge  DVT prophylaxis: Heparin subcu  CODE STATUS: Discussed in detail with the patient, she requested DNR status  Consults called: Oncology  Family Communication: Admission, patients condition and plan of care including tests being ordered have been discussed with the patient who indicates understanding and agree with the plan and Code Status  Admission status: Telemetry, observation  Disposition plan: Further plan will depend as patient's clinical course evolves and further radiologic and laboratory data become available.    At the time of admission, it appears that the appropriate admission status for this patient is observation. This is judged to be reasonable and necessary in order to provide the required intensity of service to ensure the patient's safety given the presenting symptoms odynophagia, hypokalemia, dehydration, neutropenia, physical exam findings, and initial radiographic and laboratory data in the context of their chronic comorbidities.  The medical decision making on this patient was of high complexity and the patient is at high risk for clinical deterioration, therefore this is a level 3 visit.     Time Spent on Admission: 60 minutes    Tanveer Dobberstein M.D. [facility] 05/01/2019, 5:09 PM

## 2019-05-01 NOTE — ED Notes (Signed)
Date and time results received: 05/01/19 2:03 PM  (use smartphrase ".now" to insert current time)  Test: WBC  Critical Value: 0.4  Name of Provider Notified: [facility]  Orders Received? Or Actions Taken?: awaiting orders

## 2019-05-01 NOTE — ED Provider Notes (Signed)
[facility] DEPT Provider Note   CSN: [id number] Arrival date & time: 05/01/19  1226    History   Chief Complaint Chief Complaint  Patient presents with   Weakness    HPI Tammy Hensley is a 78 y.o. female.     Patient has metastatic endometrial cancer and received chemotherapy last Monday.  She started feeling weak the very next day and she fell today from weakness and hit her head no loss of consciousness  The history is provided by the patient. No language interpreter was used.  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/17/2016   Breast cancer ([place]) 2006   left breast   Depression    Diabetes ([place])    borderline   Endometrial ca ([place]) 2018   Family history of cancer    History of brachytherapy 03/29/17-04/12/17   vaginal cuff 18 Gy in 3 fractions   History of radiation therapy 02/10/17-03/16/17   pelvis 45 Gy in 25 fractions   Hyperlipidemia    Hypertension    Iron deficiency anemia    Metastatic 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 ([place]) 2019   left hip   Vitamin D deficiency     Patient Active Problem List   Diagnosis Date Noted   Neutropenia ([place]) 05/01/2019   Anemia, chronic disease 04/07/2019   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([place]) 02/16/2019   Anemia due to antineoplastic chemotherapy 01/17/2019   Fluid retention in legs 12/06/2018   Pancytopenia, acquired ([place]) 12/06/2018   Goals of care, counseling/discussion 09/16/2018   Metastasis to bone ([place]) 08/30/2018   Metastasis to lung ([place]) 08/30/2018   Cancer associated pain 08/30/2018     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   Other constipation 08/30/2018   Preventive measure 08/30/2018   Physical debility 08/30/2018   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   Insomnia 09/07/2017   T2_NIDDM 01/06/2017   Genetic testing 01/01/2017   Family history of cancer    FIGO stage II endometrial cancer ([place]) 11/17/2016   Atherosclerosis of abdominal aorta ([place]) 11/17/2016   Endometrial cancer ([place]) 09/30/2016   Osteopenia 11/22/2015   BMI 25.0-25.9,adult 08/24/2015   Medication management 01/03/2014   Breast cancer, left breast ([place]) 12/19/2013   Hypothyroidism    Hypertension    Hyperlipidemia    Vitamin D deficiency    Iron deficiency anemia    Malignant neoplasm of upper-outer quadrant of left breast in female, 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    Past Surgical History:  Procedure Laterality Date   BREAST LUMPECTOMY Left 2006   BREAST SURGERY  2006   left breast lumpectomy- radiation, took Femara    IR IMAGING GUIDED PORT INSERTION  09/29/2018   perineum  1973   correction  of vaginal and rectal area from birth -did not have episiotomy from first birth   ROBOTIC ASSISTED LAP VAGINAL HYSTERECTOMY N/A 10/13/2016   Procedure: XI ROBOTIC [place];  Surgeon: Nancy Marus, MD;  Location: [facility];  Service: Gynecology;  Laterality: N/A;   ROBOTIC ASSISTED SALPINGO OOPHERECTOMY Bilateral 10/13/2016   Procedure: XI ROBOTIC ASSISTED SALPINGO OOPHORECTOMY;  Surgeon: Nancy Marus,  MD;  Location: [facility];  Service: Gynecology;  Laterality: Bilateral;   SENTINEL NODE BIOPSY N/A 10/13/2016   Procedure: SENTINEL NODE BIOPSY;  Surgeon: Nancy Marus, MD;  Location: [facility];  Service: Gynecology;  Laterality: N/A;     OB History    Gravida  4   Para  2   Term  2   Preterm      AB  2   Living  2     SAB  2   TAB      Ectopic      Multiple      Live Births  2        Obstetric Comments  SVDx2          Home Medications    Prior to Admission medications   Medication Sig Start Date End Date Taking? Authorizing Provider  acetaminophen (TYLENOL) 500 MG tablet Take 500 mg by mouth daily as needed for moderate pain or headache.    [provider]  aspirin 81 MG tablet Take 81 mg by mouth daily.      [provider]  Calcium Carbonate-Vitamin D (CALCIUM 600 + D PO) Take 1 tablet by mouth 2 (two) times daily.     [provider]  Cholecalciferol (VITAMIN D) 2000 units CAPS Take 2,000 Units by mouth every other day.    [provider]  dexamethasone (DECADRON) 4 MG tablet Take 1 tablet (4 mg total) by mouth daily. 04/07/19   [name], MD  furosemide (LASIX) 20 MG tablet Once daily for 3 days, repeat as needed for lower extremity edema 10/18/18   Harle Stanford., PA-C  levothyroxine (SYNTHROID, LEVOTHROID) 88 MCG tablet Take 1 tablet (88 mcg total) by mouth daily before breakfast. 05/04/17   [name], MD  loratadine-pseudoephedrine (CLARITIN-D 24 HOUR) 10-240 MG 24 hr tablet Take 1 tablet by mouth daily as needed for allergies.    [provider]  metFORMIN (GLUCOPHAGE-XR) 500 MG 24 hr tablet Take 1 tablet (500 mg total) by mouth 2 (two) times daily. Patient taking differently: Take 500 mg by mouth 3 (three) times daily.  12/06/18   [name], MD  Multiple Vitamin (MULTIVITAMIN WITH MINERALS) TABS tablet Take 1 tablet by mouth daily.    [provider]  ondansetron (ZOFRAN) 8 MG tablet Take 1 tablet (8 mg total) by mouth every 8 (eight) hours as needed for refractory nausea / vomiting. Start on day 3 after chemo. Patient not taking: Reported on 03/21/2019 09/15/18   [name], MD  pravastatin (PRAVACHOL) 40 MG tablet Take 1 tablet (40 mg total) by mouth every evening. 03/02/18   [name], MD  prochlorperazine (COMPAZINE) 10 MG tablet Take 1 tablet (10 mg total) by mouth every 6 (six) hours as needed (Nausea or  vomiting). Patient not taking: Reported on 03/21/2019 09/15/18   [name], MD  traMADol (ULTRAM) 50 MG tablet Take 1 tablet (50 mg total) by mouth 3 (three) times daily. Take 1/2 to 1 tablet every 4 hours as needed for severe Back Pain 02/20/19   [name], MD  triamterene-hydrochlorothiazide (MAXZIDE-25) 37.5-25 MG tablet Take 1 tablet by mouth daily. Takes in the am    [provider]    Family History Family History  Problem Relation Age of Onset   Heart disease Mother    Heart attack Mother    Cancer Father    Heart attack Father    Prostate cancer Father 24  deceased 50   Breast cancer Paternal Aunt 22       deceased 10s   Prostate cancer Paternal Uncle 38       deceased 44   Cancer Paternal Grandmother        unsure; deceased 36s   Prostate cancer Brother 6       currently 30   Prostate cancer Brother 37       currently 73    Social History Social History   Tobacco Use   Smoking status: Never Smoker   Smokeless tobacco: Never Used  Substance Use Topics   Alcohol use: No   Drug use: No     Allergies   Omnipaque [iohexol]; Calan [verapamil]; Effexor [venlafaxine]; Erythromycin; Femara [letrozole]; Fosamax [alendronate]; Ibuprofen; Paxil [paroxetine hcl]; Remeron [mirtazapine]; and Vasotec [enalapril]   Review of Systems Review of Systems  Constitutional: Negative for appetite change and fatigue.  HENT: Negative for congestion, ear discharge and sinus pressure.   Eyes: Negative for discharge.  Respiratory: Negative for cough.   Cardiovascular: Negative for chest pain.  Gastrointestinal: Negative for abdominal pain and diarrhea.  Genitourinary: Negative for frequency and hematuria.  Musculoskeletal: Negative for back pain.  Skin: Negative for rash.  Neurological: Positive for weakness. Negative for seizures and headaches.  Psychiatric/Behavioral: Negative for hallucinations.     Physical Exam Updated Vital Signs BP 140/72     Pulse 85    Temp 99 F (37.2 C) (Oral)    Resp 18    SpO2 99%   Physical Exam Vitals signs and nursing note reviewed.  Constitutional:      Appearance: She is well-developed.  HENT:     Head: Normocephalic.     Nose: Nose normal.  Eyes:     General: No scleral icterus.    Conjunctiva/sclera: Conjunctivae normal.  Neck:     Musculoskeletal: Neck supple.     Thyroid: No thyromegaly.  Cardiovascular:     Rate and Rhythm: Normal rate and regular rhythm.     Heart sounds: No murmur. No friction rub. No gallop.   Pulmonary:     Breath sounds: No stridor. No wheezing or rales.  Chest:     Chest wall: No tenderness.  Abdominal:     General: There is no distension.     Tenderness: There is no abdominal tenderness. There is no rebound.  Musculoskeletal: Normal range of motion.  Lymphadenopathy:     Cervical: No cervical adenopathy.  Skin:    Findings: No erythema or rash.  Neurological:     Mental Status: She is oriented to person, place, and time.     Motor: No abnormal muscle tone.     Coordination: Coordination normal.  Psychiatric:        Behavior: Behavior normal.      ED Treatments / Results  Labs (all labs ordered are listed, but only abnormal results are displayed) Labs Reviewed  BASIC METABOLIC PANEL - Abnormal; Notable for the following components:      Result Value   Sodium 132 (*)    Potassium 2.8 (*)    Chloride 92 (*)    Glucose, Bld 137 (*)    Calcium 8.6 (*)    All other components within normal limits  CBC - Abnormal; Notable for the following components:   WBC 0.4 (*)    RBC 3.76 (*)    Hemoglobin 9.5 (*)    HCT 29.4 (*)    MCV 78.2 (*)    MCH 25.3 (*)  RDW 19.9 (*)    nRBC 8.1 (*)    All other components within normal limits  HEPATIC FUNCTION PANEL - Abnormal; Notable for the following components:   Total Protein 5.6 (*)    Albumin 3.0 (*)    AST 44 (*)    Bilirubin, Direct 0.3 (*)    All other components within normal limits  CBG  MONITORING, ED - Abnormal; Notable for the following components:   Glucose-Capillary 131 (*)    All other components within normal limits  SARS CORONAVIRUS 2 (HOSPITAL ORDER, [facility])  URINALYSIS, ROUTINE W REFLEX MICROSCOPIC  DIFFERENTIAL  TYPE AND SCREEN    EKG None  Radiology Ct Head Wo Contrast  Result Date: 05/01/2019 CLINICAL DATA:  Posttraumatic headache after fall today. EXAM: CT HEAD WITHOUT CONTRAST CT CERVICAL SPINE WITHOUT CONTRAST TECHNIQUE: Multidetector CT imaging of the head and cervical spine was performed following the standard protocol without intravenous contrast. Multiplanar CT image reconstructions of the cervical spine were also generated. COMPARISON:  None. FINDINGS: CT HEAD FINDINGS Brain: Mild diffuse cortical atrophy is noted. Mild chronic ischemic white matter disease is noted. No mass effect or midline shift is noted. Ventricular size is within normal limits. There is no evidence of mass lesion, hemorrhage or acute infarction. Vascular: No hyperdense vessel or unexpected calcification. Skull: Normal. Negative for fracture or focal lesion. Sinuses/Orbits: No acute finding. Other: None. CT CERVICAL SPINE FINDINGS Alignment: Minimal grade 1 anterolisthesis of C4-5 is noted secondary to posterior facet joint hypertrophy. Skull base and vertebrae: No acute fracture. No primary bone lesion or focal pathologic process. Soft tissues and spinal canal: No prevertebral fluid or swelling. No visible canal hematoma. Disc levels: Mild degenerative disc disease is noted at C5-6 with anterior posterior osteophyte formation. There appears to be fusion of the C6-7 disc space most likely due to degenerative change. Upper chest: Negative. Other: Degenerative changes are seen involving posterior facet joints bilaterally. IMPRESSION: Mild diffuse cortical atrophy. Mild chronic ischemic white matter disease. No acute intracranial abnormality seen. Multilevel  degenerative disc disease. No acute abnormality seen in the cervical spine. Electronically Signed   By: Marijo Conception M.D.   On: 05/01/2019 15:17   Ct Cervical Spine Wo Contrast  Result Date: 05/01/2019 CLINICAL DATA:  Posttraumatic headache after fall today. EXAM: CT HEAD WITHOUT CONTRAST CT CERVICAL SPINE WITHOUT CONTRAST TECHNIQUE: Multidetector CT imaging of the head and cervical spine was performed following the standard protocol without intravenous contrast. Multiplanar CT image reconstructions of the cervical spine were also generated. COMPARISON:  None. FINDINGS: CT HEAD FINDINGS Brain: Mild diffuse cortical atrophy is noted. Mild chronic ischemic white matter disease is noted. No mass effect or midline shift is noted. Ventricular size is within normal limits. There is no evidence of mass lesion, hemorrhage or acute infarction. Vascular: No hyperdense vessel or unexpected calcification. Skull: Normal. Negative for fracture or focal lesion. Sinuses/Orbits: No acute finding. Other: None. CT CERVICAL SPINE FINDINGS Alignment: Minimal grade 1 anterolisthesis of C4-5 is noted secondary to posterior facet joint hypertrophy. Skull base and vertebrae: No acute fracture. No primary bone lesion or focal pathologic process. Soft tissues and spinal canal: No prevertebral fluid or swelling. No visible canal hematoma. Disc levels: Mild degenerative disc disease is noted at C5-6 with anterior posterior osteophyte formation. There appears to be fusion of the C6-7 disc space most likely due to degenerative change. Upper chest: Negative. Other: Degenerative changes are seen involving posterior facet joints bilaterally. IMPRESSION:  Mild diffuse cortical atrophy. Mild chronic ischemic white matter disease. No acute intracranial abnormality seen. Multilevel degenerative disc disease. No acute abnormality seen in the cervical spine. Electronically Signed   By: Marijo Conception M.D.   On: 05/01/2019 15:17     Procedures Procedures (including critical care time)  Medications Ordered in ED Medications  potassium chloride 10 mEq in 100 mL IVPB (has no administration in time range)  sodium chloride flush (NS) 0.9 % injection 3 mL (3 mLs Intravenous Given 05/01/19 1322)  HYDROcodone-acetaminophen (NORCO/VICODIN) 5-325 MG per tablet 1 tablet (1 tablet Oral Given 05/01/19 1359)  sodium chloride 0.9 % bolus 1,000 mL (0 mLs Intravenous Stopped 05/01/19 1538)     Initial Impression / Assessment and Plan / ED Course  I have reviewed the triage vital signs and the nursing notes.  Pertinent labs & imaging results that were available during my care of the patient were reviewed by me and considered in my medical decision making (see chart for details).        CRITICAL CARE Performed by: Milton Ferguson Total critical care time:40 minutes Critical care time was exclusive of separately billable procedures and treating other patients. Critical care was necessary to treat or prevent imminent or life-threatening deterioration. Critical care was time spent personally by me on the following activities: development of treatment plan with patient and/or surrogate as well as nursing, discussions with consultants, evaluation of patient's response to treatment, examination of patient, obtaining history from patient or surrogate, ordering and performing treatments and interventions, ordering and review of laboratory studies, ordering and review of radiographic studies, pulse oximetry and re-evaluation of patient's condition. Labs reviewed and patient is leukopenic and hypokalemic.  I spoke with her oncologist and it was decided to admit the patient for IV fluids potassium and reassessment in the morning  Final Clinical Impressions(s) / ED Diagnoses   Final diagnoses:  None    ED Discharge Orders    None       Milton Ferguson, MD 05/01/19 1646

## 2019-05-01 NOTE — ED Notes (Signed)
Report given to [facility], Therapist, sports.

## 2019-05-01 NOTE — ED Triage Notes (Addendum)
Patient arrived via [facility] from home.   Patient started new chemo medication on Monday last week 1st of June. On Tuesday patient stared experiencing sore throat and shob with movement.   Patient has had decreased fluid and food intake due to sore throat.   C/O weakness and sore throat.   Patient spoke with oncology nurse about her symptoms and was suggested to come in ED for evaluation.  Patient has diarrhea on Tuesday and Thursday.  Denies n/v   Patient fell lastnight around midnight due to being "Weak". Patient states she hit her head. No injury noted by ems.    Patient has irregular pulse rate with ems.   No history of a.fibb per ems.   With EMS NSR.   Patients son noted that patient is having increased incontinence. Bowel and bladder.  A/Ox4  Patient usually walks but felt too weak to walk today.   Stage 4 endometrial patient.    Port in right upper chest.   Dr. Alvy Bimler is patients oncologist.

## 2019-05-01 NOTE — ED Notes (Signed)
Attempted to call transport. No answer.

## 2019-05-01 NOTE — Progress Notes (Signed)
Attempted to call for report RN said she will call back.

## 2019-05-02 DIAGNOSIS — T451X5A Adverse effect of antineoplastic and immunosuppressive drugs, initial encounter: Secondary | ICD-10-CM | POA: Diagnosis present

## 2019-05-02 DIAGNOSIS — D638 Anemia in other chronic diseases classified elsewhere: Secondary | ICD-10-CM | POA: Diagnosis not present

## 2019-05-02 DIAGNOSIS — E860 Dehydration: Principal | ICD-10-CM

## 2019-05-02 DIAGNOSIS — D708 Other neutropenia: Secondary | ICD-10-CM | POA: Diagnosis not present

## 2019-05-02 DIAGNOSIS — K5900 Constipation, unspecified: Secondary | ICD-10-CM | POA: Diagnosis present

## 2019-05-02 DIAGNOSIS — K1231 Oral mucositis (ulcerative) due to antineoplastic therapy: Secondary | ICD-10-CM | POA: Diagnosis present

## 2019-05-02 DIAGNOSIS — E1165 Type 2 diabetes mellitus with hyperglycemia: Secondary | ICD-10-CM | POA: Diagnosis present

## 2019-05-02 DIAGNOSIS — D630 Anemia in neoplastic disease: Secondary | ICD-10-CM | POA: Diagnosis present

## 2019-05-02 DIAGNOSIS — Z881 Allergy status to other antibiotic agents status: Secondary | ICD-10-CM

## 2019-05-02 DIAGNOSIS — Z66 Do not resuscitate: Secondary | ICD-10-CM

## 2019-05-02 DIAGNOSIS — C50412 Malignant neoplasm of upper-outer quadrant of left female breast: Secondary | ICD-10-CM | POA: Diagnosis present

## 2019-05-02 DIAGNOSIS — Z888 Allergy status to other drugs, medicaments and biological substances status: Secondary | ICD-10-CM

## 2019-05-02 DIAGNOSIS — R531 Weakness: Secondary | ICD-10-CM | POA: Diagnosis present

## 2019-05-02 DIAGNOSIS — Z90710 Acquired absence of both cervix and uterus: Secondary | ICD-10-CM

## 2019-05-02 DIAGNOSIS — Z90722 Acquired absence of ovaries, bilateral: Secondary | ICD-10-CM

## 2019-05-02 DIAGNOSIS — F329 Major depressive disorder, single episode, unspecified: Secondary | ICD-10-CM | POA: Diagnosis present

## 2019-05-02 DIAGNOSIS — D701 Agranulocytosis secondary to cancer chemotherapy: Secondary | ICD-10-CM | POA: Diagnosis present

## 2019-05-02 DIAGNOSIS — C7800 Secondary malignant neoplasm of unspecified lung: Secondary | ICD-10-CM | POA: Diagnosis present

## 2019-05-02 DIAGNOSIS — C787 Secondary malignant neoplasm of liver and intrahepatic bile duct: Secondary | ICD-10-CM

## 2019-05-02 DIAGNOSIS — K219 Gastro-esophageal reflux disease without esophagitis: Secondary | ICD-10-CM | POA: Diagnosis present

## 2019-05-02 DIAGNOSIS — C541 Malignant neoplasm of endometrium: Secondary | ICD-10-CM

## 2019-05-02 DIAGNOSIS — R64 Cachexia: Secondary | ICD-10-CM | POA: Diagnosis present

## 2019-05-02 DIAGNOSIS — E785 Hyperlipidemia, unspecified: Secondary | ICD-10-CM | POA: Diagnosis present

## 2019-05-02 DIAGNOSIS — Z886 Allergy status to analgesic agent status: Secondary | ICD-10-CM

## 2019-05-02 DIAGNOSIS — D6481 Anemia due to antineoplastic chemotherapy: Secondary | ICD-10-CM | POA: Diagnosis present

## 2019-05-02 DIAGNOSIS — E876 Hypokalemia: Secondary | ICD-10-CM | POA: Diagnosis present

## 2019-05-02 DIAGNOSIS — R197 Diarrhea, unspecified: Secondary | ICD-10-CM | POA: Diagnosis present

## 2019-05-02 DIAGNOSIS — K5909 Other constipation: Secondary | ICD-10-CM

## 2019-05-02 DIAGNOSIS — C50912 Malignant neoplasm of unspecified site of left female breast: Secondary | ICD-10-CM

## 2019-05-02 DIAGNOSIS — Z923 Personal history of irradiation: Secondary | ICD-10-CM

## 2019-05-02 DIAGNOSIS — E039 Hypothyroidism, unspecified: Secondary | ICD-10-CM | POA: Diagnosis present

## 2019-05-02 DIAGNOSIS — Z20828 Contact with and (suspected) exposure to other viral communicable diseases: Secondary | ICD-10-CM | POA: Diagnosis present

## 2019-05-02 DIAGNOSIS — C7951 Secondary malignant neoplasm of bone: Secondary | ICD-10-CM | POA: Diagnosis present

## 2019-05-02 DIAGNOSIS — I10 Essential (primary) hypertension: Secondary | ICD-10-CM | POA: Diagnosis present

## 2019-05-02 DIAGNOSIS — Z91041 Radiographic dye allergy status: Secondary | ICD-10-CM

## 2019-05-02 LAB — CBC WITH DIFFERENTIAL/PLATELET
Abs Immature Granulocytes: 0 10*3/uL (ref 0.00–0.07)
Basophils Absolute: 0 10*3/uL (ref 0.0–0.1)
Basophils Relative: 0 %
Eosinophils Absolute: 0 10*3/uL (ref 0.0–0.5)
Eosinophils Relative: 0 %
HCT: 28.1 % — ABNORMAL LOW (ref 36.0–46.0)
Hemoglobin: 8.9 g/dL — ABNORMAL LOW (ref 12.0–15.0)
Immature Granulocytes: 0 %
Lymphocytes Relative: 46 %
Lymphs Abs: 0.1 10*3/uL — ABNORMAL LOW (ref 0.7–4.0)
MCH: 25.4 pg — ABNORMAL LOW (ref 26.0–34.0)
MCHC: 31.7 g/dL (ref 30.0–36.0)
MCV: 80.1 fL (ref 80.0–100.0)
Monocytes Absolute: 0.1 10*3/uL (ref 0.1–1.0)
Monocytes Relative: 29 %
Neutro Abs: 0.1 10*3/uL — ABNORMAL LOW (ref 1.7–7.7)
Neutrophils Relative %: 25 %
Platelets: 261 10*3/uL (ref 150–400)
RBC: 3.51 MIL/uL — ABNORMAL LOW (ref 3.87–5.11)
RDW: 20 % — ABNORMAL HIGH (ref 11.5–15.5)
WBC: 0.3 10*3/uL — CL (ref 4.0–10.5)
nRBC: 10.7 % — ABNORMAL HIGH (ref 0.0–0.2)

## 2019-05-02 LAB — BASIC METABOLIC PANEL
Anion gap: 8 (ref 5–15)
BUN: 7 mg/dL — ABNORMAL LOW (ref 8–23)
CO2: 26 mmol/L (ref 22–32)
Calcium: 7.9 mg/dL — ABNORMAL LOW (ref 8.9–10.3)
Chloride: 99 mmol/L (ref 98–111)
Creatinine, Ser: 0.54 mg/dL (ref 0.44–1.00)
GFR calc Af Amer: >60 mL/min (ref >60)
GFR calc non Af Amer: >60 mL/min (ref >60)
Glucose, Bld: 225 mg/dL — ABNORMAL HIGH (ref 70–99)
Potassium: 3.5 mmol/L (ref 3.5–5.1)
Sodium: 133 mmol/L — ABNORMAL LOW (ref 135–145)

## 2019-05-02 LAB — HEMOGLOBIN A1C
Hgb A1c MFr Bld: 6.8 % — ABNORMAL HIGH (ref 4.8–5.6)
Mean Plasma Glucose: 148.46 mg/dL

## 2019-05-02 LAB — GLUCOSE, CAPILLARY
Glucose-Capillary: 224 mg/dL — ABNORMAL HIGH (ref 70–99)
Glucose-Capillary: 132 mg/dL — ABNORMAL HIGH (ref 70–99)
Glucose-Capillary: 162 mg/dL — ABNORMAL HIGH (ref 70–99)
Glucose-Capillary: 131 mg/dL — ABNORMAL HIGH (ref 70–99)

## 2019-05-02 MED ORDER — PANTOPRAZOLE SODIUM 40 MG PO TBEC
40.0000 mg | DELAYED_RELEASE_TABLET | Freq: Two times a day (BID) | ORAL | Status: DC
  Administered 2019-05-02 – 2019-05-03 (×2): 40 mg via ORAL
  Filled 2019-05-02 (×2): qty 1

## 2019-05-02 MED ORDER — LEVOTHYROXINE SODIUM 88 MCG PO TABS
44.0000 ug | ORAL_TABLET | ORAL | Status: DC
  Administered 2019-05-03: 44 ug via ORAL
  Filled 2019-05-02: qty 1

## 2019-05-02 MED ORDER — POLYETHYLENE GLYCOL 3350 17 G PO PACK
17.0000 g | PACK | Freq: Every day | ORAL | Status: DC
  Administered 2019-05-02 – 2019-05-03 (×2): 17 g via ORAL
  Filled 2019-05-02 (×3): qty 1

## 2019-05-02 MED ORDER — LEVOTHYROXINE SODIUM 88 MCG PO TABS: 88.0000 ug | ORAL_TABLET | ORAL | Status: DC

## 2019-05-02 MED ORDER — DEXAMETHASONE SODIUM PHOSPHATE 4 MG/ML IJ SOLN
4.0000 mg | INTRAMUSCULAR | Status: DC
  Administered 2019-05-02: 22:00:00 4 mg via INTRAVENOUS
  Filled 2019-05-02: qty 1

## 2019-05-02 NOTE — Progress Notes (Addendum)
[facility] OFFICE PROGRESS NOTE  Patient Care Team: [name], MD as PCP - General (Internal Medicine) [name], MD as Consulting Physician (Gastroenterology) [name], MD as Consulting Physician (Ophthalmology) [name], [name], MD as Consulting Physician (Obstetrics and Gynecology) [name], MD as Consulting Physician (Hematology and Oncology) [name], MD as Consulting Physician (Radiation Oncology) I have seen the patient, examined her and agreed with documentation as follows ASSESSMENT & PLAN:  Endometrial cancer ([place]) Currently on Taxotere - last dose 04/24/2019  I recommend minimum 3 cycles of treatment before repeat imaging. Planned future dose reduction due to poor tolerance to treatment Continue supportive care  Neutropenia secondary to chemotherapy The patient is afebrile She has no signs of infection Recommend continue monitoring of her white blood cell count and proceed with antibiotics if she develops a fever Blood cultures have been drawn and are pending No need GCSF for now  Dehydration The patient is feeling much better with IV fluids Oral intake has improved Continue IV fluids per hospitalist Encourage advance diet as tolerated  Mucositis secondary to chemotherapy Improved with Magic mouthwash with lidocaine Continue Magic mouthwash  Hypokalemia Replete per hospitalist  Anemia due to antineoplastic chemotherapy The pattern of anemia is not consistent with iron deficiency, more like anemia chronic illness likely secondary to active disease She does not need iron infusion or blood transfusion We will monitor closely while she is on treatment  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 She will continue the same for now I suspect her appetite will improve once chemotherapy is started  Other constipation Schedule Miralax  Goals of care, counseling/discussion We have brief discussion  about goals of care The patient is aware that treatment goal is palliative Patient is a DNR/DNI  Discharge planning No ready today until she can tolerate regular diet Likely next 2-3 days  INTERVAL HISTORY: She was admitted to the hospital after presentation with fall and weakness She was not able to eat and drink much since chemotherapy last week She has been constipated last week as well CT imaging rule out fractures or injuries Since admission, she has been receiving IV fluids.  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 SEE COMMENT.  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 The greatest extent of invasive carcinoma as measured on the needle core biopsy measures 0.6 cm.       Endometrial cancer ([place])   02/03/2002 Pathology Results    1. BENIGN ENDOMETRIAL POLYPS.  2. UTERINE FIBROIDS: PORTIONS OF SMOOTH MUSCLE CONSISTENT WITH BENIGN LEIOMYOMA(S). PORTIONS OF BENIGN, CYSTICALLY ATROPHIC ENDOMETRIUM WITHOUT HYPERPLASIA OR EVIDENCE OF MALIGNANCY. 3. ENDOMETRIAL CURETTAGE: BENIGN ENDOMETRIUM AND SMOOTH MUSCLE.    09/07/2016 Pathology Results    PAP smear positive for malignant cells    09/07/2016 Initial Diagnosis    Patient had postmenopausal  bleeding in 08-2016. She had PAP 09-07-16 by  PCP Dr [name], which documented carcinoma. She had evaluation by Dr [name] with colposcopy/ ECC/endometrial biopsy documenting serous endometrioid endometrial carcinoma.    09/15/2016 Imaging    Ct imaging showed markedly thickened and heterogeneous endometrial stripe suspicious for endometrial carcinoma in this patient with postmenopausal vaginal bleeding. Cystic lesion in the right ovary cannot be definitively characterized. Pelvic ultrasound is recommended for further evaluation. Aortoiliac atherosclerosis. Avascular necrosis of the femoral heads bilaterally.    10/07/2016 Tumor Marker    Patient's tumor was tested for the following markers: CA125 Results of the tumor marker test revealed 15.1    10/13/2016 Pathology Results    1. Lymph node, sentinel, biopsy, right obturator - THERE IS NO EVIDENCE OF CARCINOMA IN 8 OF 8 LYMPH NODES (0/8). - SEE COMMENT. 2. Lymph node, sentinel, biopsy, left obturator - THERE IS NO EVIDENCE OF CARCINOMA IN 1 OF 1 LYMPH NODE (0/1). - SEE COMMENT. 3. Lymph node, sentinel, biopsy, left para-aortic - THERE IS NO EVIDENCE OF CARCINOMA IN 4 OF 4 LYMPH NODES (0/4). - SEE COMMENT. 4. Uterus +/- tubes/ovaries, neoplastic, cervix - INVASIVE MIXED SEROUS/ENDOMETRIOID ADENOCARCINOMA, MULTIPLE FOCI, FIGO GRADE III, THE LARGEST FOCUS SPANS 8.2 CM. - ADENOCARCINOMA EXTENDS INTO THE OUTER HALF OF THE MYOMETRIUM AND INVOLVES THE STROMA OF THE LOWER UTERINE SEGMENT AND CERVIX. - LYMPHOVASCULAR INVASION IS IDENTIFIED. - THE SURGICAL RESECTION MARGINS ARE NEGATIVE FOR CARCINOMA. - SEE ONCOLOGY TABLE BELOW. ADDITIONAL FINDINGS: - ENDOMETRIUM: ENDOMETRIOID TYPE POLYP(S), WHICH CONTAIN FOCI OF SIMILAR APPEARING MIXED SEROUS/ENDOMETRIOID ADENOCARCINOMA. - MYOMETRIUM: LEIOMYOMATA. - SEROSA: UNREMARKABLE. - BILATERAL ADNEXA: BENIGN OVARIES AND FALLOPIAN TUBES.    10/13/2016 Surgery    Surgery: Total robotic hysterectomy bilateral salpingo-oophorectomy, bilateral  pelvic sentinel lymph node removal, left PA sentinel lymph node removal. Mini-laparotomy for specimen removal Surgeons:  [name], MD; [name], MD  Pathology:  1)Uterus, cervix, bilateral tubes and ovaries 2) Right obturator SLN 3) Left Obturator SLN 4) Left PA SLN Operative findings: 14 week fibroid uterus with one large dominant 6 cm myoma that was palpably calcified. 4 cm right ovarian cyst. + SLN identified in the bilateral obturator spaces, + SLN identified in the left PA space.     12/01/2016 - 05/31/2017 Chemotherapy    She received carboplatin & Taxol. She had 3 cycles of chemo followed by radiation and then 3 more cycles of chemo    01/11/2017 Genetic Testing    Testing was normal and did not reveal a mutation in these genes: The genes tested were the 80 genes on [employer]'s Multi-Cancer panel (ALK, APC, ATM, AXIN2, BAP1, BARD1, BLM, BMPR1A, BRCA1, BRCA2, BRIP1, CASR, CDC73, CDH1, CDK4, CDKN1B, CDKN1C, CDKN2A, CEBPA, CHEK2, DICER1, DIS3L2, EGFR, EPCAM, FH, FLCN, GATA2, GPC3, GREM1, HOXB13,  HRAS, KIT, MAX, MEN1, MET, MITF, MLH1, MSH2, MSH6, MUTYH, NBN, NF1, NF2, PALB2, PDGFRA, PHOX2B, PMS2, POLD1, POLE, POT1, PRKAR1A, PTCH1, PTEN, RAD50, RAD51C, RAD51D, RB1, RECQL4, RET, RUNX1, SDHA, SDHAF2, SDHB, SDHC, SDHD, SMAD4, SMARCA4, SMARCB1, SMARCE1, STK11, SUFU, TERC, TERT, TMEM127, TP53, TSC1, TSC2, VHL, WRN, and WT1).    02/10/2017 - 04/12/2017 Radiation Therapy    02/10/17-03/16/17; 03/29/17-04/12/17;  1) Pelvis/ 45 Gy in 25 fractions  2) Vaginal Cuff/ 18 Gy in 3 fractions    08/26/2018 Imaging    Large destructive mass lesion left iliac bone with large soft tissue mass extending into the posterior soft tissues and pelvis compatible with metastatic disease. Consider follow-up CT chest abdomen pelvis with contrast for further  staging.  Radiation changes at L5 and the sacrum. No lumbar metastatic deposits  Multilevel degenerative changes above.    08/29/2018 Imaging    1.  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 2. New irregular nodular opacity in the medial right middle lobe, new since 2017 CT. Separate subcentimeter right upper lobe solid pulmonary nodule, for which no comparison exists. These findings are indeterminate for pulmonary metastases and attention on follow-up chest CT in 3 months is recommended. 3. No lymphadenopathy or additional findings of metastatic disease.No tumor recurrence at the hysterectomy margin. 4. Aortic Atherosclerosis (ICD10-I70.0). Additional chronic findings as detailed.    08/29/2018 Tumor Marker    Patient's tumor was tested for the following markers: CA-125 Results of the tumor marker test revealed 17.4    09/06/2018 Procedure    CT-guided core biopsy performed of huge mass in the left iliac fossa destroying the left iliac bone.    09/06/2018 Pathology Results    Soft Tissue Needle Core Biopsy, Left Iliac Fossa - POORLY DIFFERENTIATED MALIGNANT NEOPLASM. Microscopic Comment The provided clinical history of both endometrial and breast cancer is noted. In the current case, immunohistochemistry for qualitative ER demonstrates scattered positive staining. CK8/18 is focally positive. CKAE1/3, CK7, CK20, TTF-1, CDX-2, GATA3 and PAX 8 are negative. The immunophenotype is not specific as to an origin for this poorly differentiated malignant neoplasm. The morphology is more suggestive of endometrial cancer than breast cancer, and may represent an unrecognized carcinosarcomatous component.    09/08/2018 - 09/27/2018 Radiation Therapy    She had palliative radiation treatment    09/29/2018 Procedure    Placement of single lumen port a cath via right internal jugular vein. The catheter tip lies at the cavo-atrial junction. A power injectable port a cath was placed and is ready for immediate use.    10/04/2018 - 01/17/2019 Chemotherapy    The patient had carboplatin and taxol x 6 cycles     12/05/2018 Tumor Marker    Patient's tumor was tested for the following markers: CA125 Results of the tumor marker test revealed 5.5    12/05/2018 Imaging    1. Considerable cystic degeneration in the large destructive mass of the left iliac bone. New or progressive left iliac bone pathologic fracture extending into the left SI joint, along with abnormal subluxation at the pubic symphysis indicating pubic symphysis instability. 2. Previous pleural-based nodularity in the right middle lobe is markedly improved, currently 4 mm in thickness and previously 11 mm in thickness. 3. Indistinct new wedge-shaped lesion in the left mid kidney, hypodense on portal venous phase images and hyperdense on delayed phase images, suggesting prolong tubular transit. This is a nonspecific finding but could reflect local inflammation, infection, or less likely infiltrative tumor. 4. Other imaging findings of potential clinical significance: Aortic Atherosclerosis (ICD10-I70.0). Left main coronary artery atherosclerosis. 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    02/15/2019 Imaging    1. Slight progression of large expansile lytic lesion involving the left iliac bone. This demonstrates central low density, suggesting partial necrosis. This lesion was biopsied on 09/06/2018, revealing poorly differentiated malignant neoplasm. 2. New low-density lesion in the dome of the right hepatic lobe worrisome for cystic or treated metastasis. 3. No other evidence of metastatic disease. No explanation for the patient's symptoms. 4. 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     03/03/2019 -  04/07/2019 Anti-estrogen oral therapy    She cannot tolerate tamoxifen and megace    03/23/2019 Tumor Marker    Patient's tumor was tested for the following markers: CA125 Results of the tumor marker  test revealed 3.8    04/13/2019 Imaging    1. 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 2. Mild decrease in size of large lytic bone metastasis and associated soft tissue mass involving the left ilium. 3. Unstable pathologic fracture of the left ilium, without significant change since prior exam. 4. New right lower lobe pulmonary metastases.      04/24/2019 -  Chemotherapy    The patient had DOCEtaxel (TAXOTERE) 130 mg in sodium chloride 0.9 % 250 mL chemo infusion, 75 mg/m2 = 130 mg, Intravenous,  Once, 1 of 4 cycles Administration: 130 mg (04/24/2019)  for chemotherapy treatment.      Metastasis to bone ([place])   04/24/2019 -  Chemotherapy    The patient had DOCEtaxel (TAXOTERE) 130 mg in sodium chloride 0.9 % 250 mL chemo infusion, 75 mg/m2 = 130 mg, Intravenous,  Once, 1 of 4 cycles Administration: 130 mg (04/24/2019)  for chemotherapy treatment.      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([place])   02/16/2019 Initial Diagnosis    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([place])    04/24/2019 -  Chemotherapy    The patient had DOCEtaxel (TAXOTERE) 130 mg in sodium chloride 0.9 % 250 mL chemo infusion, 75 mg/m2 = 130 mg, Intravenous,  Once, 1 of 4 cycles Administration: 130 mg (04/24/2019)  for chemotherapy treatment.      REVIEW OF SYSTEMS:   Constitutional: Reports fatigue.  Denies fevers, chills Eyes: Denies blurriness of vision Ears, nose, mouth, throat, and face: Had difficulty swallowing yesterday which has now improved Respiratory: Denies cough, dyspnea or wheezes Cardiovascular: Denies palpitation, chest discomfort or lower extremity swelling Skin: Denies abnormal skin rashes Lymphatics: Denies new lymphadenopathy or easy bruising Neurological:Denies numbness, tingling or new weaknesses Behavioral/Psych: Mood is stable, no new changes  All other systems were reviewed with the patient and are negative.  I have reviewed the past medical history, past surgical history, social history and family history  with the patient and they are unchanged from previous note.  ALLERGIES:  is allergic to omnipaque [iohexol]; calan [verapamil]; effexor [venlafaxine]; erythromycin; femara [letrozole]; fosamax [alendronate]; ibuprofen; paxil [paroxetine hcl]; remeron [mirtazapine]; and vasotec [enalapril].  MEDICATIONS:  Current Facility-Administered Medications  Medication Dose Route Frequency Provider Last Rate Last Dose  . acetaminophen (TYLENOL) tablet 650 mg  650 mg Oral Q6H PRN Rai, Ripudeep K, MD       Or  . acetaminophen (TYLENOL) suppository 650 mg  650 mg Rectal Q6H PRN Rai, Ripudeep K, MD      . dexamethasone (DECADRON) injection 4 mg  4 mg Intravenous Q24H Rai, Ripudeep K, MD   4 mg at 05/01/19 2027  . dextrose 5 % and 0.45 % NaCl with KCl 20 mEq/L infusion   Intravenous Continuous Rai, Ripudeep K, MD 100 mL/hr at 05/02/19 0806    . docusate sodium (COLACE) capsule 100 mg  100 mg Oral BID Rai, Ripudeep K, MD   100 mg at 05/02/19 1128  . heparin injection 5,000 Units  5,000 Units Subcutaneous Q8H Rai, Ripudeep K, MD   5,000 Units at 05/02/19 0522  . hydrALAZINE (APRESOLINE) injection 10 mg  10 mg Intravenous Q6H PRN Rai, Ripudeep K, MD      . HYDROcodone-acetaminophen (NORCO/VICODIN) 5-325 MG per tablet 1-2 tablet  1-2  tablet Oral Q4H PRN Rai, Vernelle Emerald, MD   1 tablet at 05/02/19 0530  . insulin aspart (novoLOG) injection 0-5 Units  0-5 Units Subcutaneous QHS Rai, Ripudeep K, MD      . insulin aspart (novoLOG) injection 0-9 Units  0-9 Units Subcutaneous TID WC Rai, Ripudeep K, MD   1 Units at 05/02/19 1222  . [START ON 05/03/2019] levothyroxine (SYNTHROID, LEVOTHROID) injection 22 mcg  22 mcg Intravenous Once per day on Mon Wed Fri Couture, Cortni S, PA-C      . levothyroxine (SYNTHROID, LEVOTHROID) injection 44 mcg  44 mcg Intravenous Once per day on Sun Tue Thu Sat Coral Ceo S, PA-C   44 mcg at 05/02/19 1134  . magic mouthwash w/lidocaine  5 mL Oral QID Rai, Ripudeep K, MD   5 mL at 05/02/19  1129  . morphine 2 MG/ML injection 1 mg  1 mg Intravenous Q3H PRN Rai, Ripudeep K, MD      . ondansetron (ZOFRAN) injection 4 mg  4 mg Intravenous Q4H PRN Rai, Ripudeep K, MD      . pantoprazole (PROTONIX) injection 40 mg  40 mg Intravenous Q12H Rai, Ripudeep K, MD   40 mg at 05/02/19 1145  . polyethylene glycol (MIRALAX / GLYCOLAX) packet 17 g  17 g Oral Daily PRN Rai, Ripudeep K, MD      . simethicone (MYLICON) 40 HK/7.4QV suspension 20 mg  20 mg Oral QID PRN Rai, Ripudeep K, MD      . sodium chloride flush (NS) 0.9 % injection 10-40 mL  10-40 mL Intracatheter PRN Rai, Ripudeep K, MD       Facility-Administered Medications Ordered in Other Encounters  Medication Dose Route Frequency Provider Last Rate Last Dose  . sodium chloride flush (NS) 0.9 % injection 10 mL  10 mL Intracatheter PRN Alvy Bimler, Dorthia Tout, MD   10 mL at 12/27/18 1825    PHYSICAL EXAMINATION: ECOG PERFORMANCE STATUS: 1 - Symptomatic but completely ambulatory BP (!) 149/78 (BP Location: Left Arm)   Pulse 83   Temp 98.4 F (36.9 C) (Oral)   Resp 18   Ht '5\' 1"'$  (1.549 m)   Wt 142 lb (64.4 kg) Comment: per pt  SpO2 99%   BMI 26.83 kg/m   GENERAL:alert, no distress and comfortable SKIN: skin color, texture, turgor are normal, no rashes or significant lesions EYES: normal, Conjunctiva are pink and non-injected, sclera clear OROPHARYNX:no exudate, no erythema and lips, buccal mucosa, and tongue normal  NECK: supple, thyroid normal size, non-tender, without nodularity LYMPH:  no palpable lymphadenopathy in the cervical, axillary or inguinal LUNGS: clear to auscultation and percussion with normal breathing effort HEART: regular rate & rhythm and no murmurs and no lower extremity edema ABDOMEN:abdomen soft, non-tender and normal bowel sounds Musculoskeletal:no cyanosis of digits and no clubbing  NEURO: alert & oriented x 3 with fluent speech, no focal motor/sensory deficits  LABORATORY DATA:  I have reviewed the data as  listed    Component Value Date/Time   NA 133 (L) 05/02/2019 0337   NA 137 05/31/2017 0931   K 3.5 05/02/2019 0337   K 3.5 05/31/2017 0931   CL 99 05/02/2019 0337   CL 99 12/20/2012 1336   CO2 26 05/02/2019 0337   CO2 25 05/31/2017 0931   GLUCOSE 225 (H) 05/02/2019 0337   GLUCOSE 161 (H) 05/31/2017 0931   GLUCOSE 110 (H) 12/20/2012 1336   BUN 7 (L) 05/02/2019 0337   BUN 10.9 05/31/2017 0931  CREATININE 0.54 05/02/2019 0337   CREATININE 0.81 04/24/2019 1210   CREATININE 0.63 07/05/2018 1009   CREATININE 0.8 05/31/2017 0931   CALCIUM 7.9 (L) 05/02/2019 0337   CALCIUM 10.6 (H) 05/31/2017 0931   PROT 5.6 (L) 05/01/2019 1321   PROT 7.5 05/31/2017 0931   ALBUMIN 3.0 (L) 05/01/2019 1321   ALBUMIN 4.2 05/31/2017 0931   AST 44 (H) 05/01/2019 1321   AST 32 04/24/2019 1210   AST 16 05/31/2017 0931   ALT 28 05/01/2019 1321   ALT 36 04/24/2019 1210   ALT 15 05/31/2017 0931   ALKPHOS 119 05/01/2019 1321   ALKPHOS 91 05/31/2017 0931   BILITOT 0.8 05/01/2019 1321   BILITOT 0.5 04/24/2019 1210   BILITOT 0.33 05/31/2017 0931   GFRNONAA >60 05/02/2019 0337   GFRNONAA >60 04/24/2019 1210   GFRNONAA 87 07/05/2018 1009   GFRAA >60 05/02/2019 0337   GFRAA >60 04/24/2019 1210   GFRAA 100 07/05/2018 1009    No results found for: SPEP, UPEP  Lab Results  Component Value Date   WBC 0.3 (LL) 05/02/2019   NEUTROABS 0.1 (L) 05/02/2019   HGB 8.9 (L) 05/02/2019   HCT 28.1 (L) 05/02/2019   MCV 80.1 05/02/2019   PLT 261 05/02/2019      Chemistry      Component Value Date/Time   NA 133 (L) 05/02/2019 0337   NA 137 05/31/2017 0931   K 3.5 05/02/2019 0337   K 3.5 05/31/2017 0931   CL 99 05/02/2019 0337   CL 99 12/20/2012 1336   CO2 26 05/02/2019 0337   CO2 25 05/31/2017 0931   BUN 7 (L) 05/02/2019 0337   BUN 10.9 05/31/2017 0931   CREATININE 0.54 05/02/2019 0337   CREATININE 0.81 04/24/2019 1210   CREATININE 0.63 07/05/2018 1009   CREATININE 0.8 05/31/2017 0931      Component  Value Date/Time   CALCIUM 7.9 (L) 05/02/2019 0337   CALCIUM 10.6 (H) 05/31/2017 0931   ALKPHOS 119 05/01/2019 1321   ALKPHOS 91 05/31/2017 0931   AST 44 (H) 05/01/2019 1321   AST 32 04/24/2019 1210   AST 16 05/31/2017 0931   ALT 28 05/01/2019 1321   ALT 36 04/24/2019 1210   ALT 15 05/31/2017 0931   BILITOT 0.8 05/01/2019 1321   BILITOT 0.5 04/24/2019 1210   BILITOT 0.33 05/31/2017 0931       RADIOGRAPHIC STUDIES: I have personally reviewed the radiological images as listed and agreed with the findings in the report. Ct Head Wo Contrast  Result Date: 05/01/2019 CLINICAL DATA:  Posttraumatic headache after fall today. EXAM: CT HEAD WITHOUT CONTRAST CT CERVICAL SPINE WITHOUT CONTRAST TECHNIQUE: Multidetector CT imaging of the head and cervical spine was performed following the standard protocol without intravenous contrast. Multiplanar CT image reconstructions of the cervical spine were also generated. COMPARISON:  None. FINDINGS: CT HEAD FINDINGS Brain: Mild diffuse cortical atrophy is noted. Mild chronic ischemic white matter disease is noted. No mass effect or midline shift is noted. Ventricular size is within normal limits. There is no evidence of mass lesion, hemorrhage or acute infarction. Vascular: No hyperdense vessel or unexpected calcification. Skull: Normal. Negative for fracture or focal lesion. Sinuses/Orbits: No acute finding. Other: None. CT CERVICAL SPINE FINDINGS Alignment: Minimal grade 1 anterolisthesis of C4-5 is noted secondary to posterior facet joint hypertrophy. Skull base and vertebrae: No acute fracture. No primary bone lesion or focal pathologic process. Soft tissues and spinal canal: No prevertebral fluid or swelling. No visible  canal hematoma. Disc levels: Mild degenerative disc disease is noted at C5-6 with anterior posterior osteophyte formation. There appears to be fusion of the C6-7 disc space most likely due to degenerative change. Upper chest: Negative. Other:  Degenerative changes are seen involving posterior facet joints bilaterally. IMPRESSION: Mild diffuse cortical atrophy. Mild chronic ischemic white matter disease. No acute intracranial abnormality seen. Multilevel degenerative disc disease. No acute abnormality seen in the cervical spine. Electronically Signed   By: Marijo Conception M.D.   On: 05/01/2019 15:17   Ct Cervical Spine Wo Contrast  Result Date: 05/01/2019 CLINICAL DATA:  Posttraumatic headache after fall today. EXAM: CT HEAD WITHOUT CONTRAST CT CERVICAL SPINE WITHOUT CONTRAST TECHNIQUE: Multidetector CT imaging of the head and cervical spine was performed following the standard protocol without intravenous contrast. Multiplanar CT image reconstructions of the cervical spine were also generated. COMPARISON:  None. FINDINGS: CT HEAD FINDINGS Brain: Mild diffuse cortical atrophy is noted. Mild chronic ischemic white matter disease is noted. No mass effect or midline shift is noted. Ventricular size is within normal limits. There is no evidence of mass lesion, hemorrhage or acute infarction. Vascular: No hyperdense vessel or unexpected calcification. Skull: Normal. Negative for fracture or focal lesion. Sinuses/Orbits: No acute finding. Other: None. CT CERVICAL SPINE FINDINGS Alignment: Minimal grade 1 anterolisthesis of C4-5 is noted secondary to posterior facet joint hypertrophy. Skull base and vertebrae: No acute fracture. No primary bone lesion or focal pathologic process. Soft tissues and spinal canal: No prevertebral fluid or swelling. No visible canal hematoma. Disc levels: Mild degenerative disc disease is noted at C5-6 with anterior posterior osteophyte formation. There appears to be fusion of the C6-7 disc space most likely due to degenerative change. Upper chest: Negative. Other: Degenerative changes are seen involving posterior facet joints bilaterally. IMPRESSION: Mild diffuse cortical atrophy. Mild chronic ischemic white matter disease. No  acute intracranial abnormality seen. Multilevel degenerative disc disease. No acute abnormality seen in the cervical spine. Electronically Signed   By: Marijo Conception M.D.   On: 05/01/2019 15:17   Ct Abdomen Pelvis W Contrast  Result Date: 04/13/2019 CLINICAL DATA:  Follow-up metastatic endometrial carcinoma. Previous surgery, chemotherapy, and radiation therapy. EXAM: CT ABDOMEN AND PELVIS WITH CONTRAST TECHNIQUE: Multidetector CT imaging of the abdomen and pelvis was performed using the standard protocol following bolus administration of intravenous contrast. CONTRAST:  142m OMNIPAQUE IOHEXOL 300 MG/ML  SOLN COMPARISON:  02/15/2019 FINDINGS: Lower Chest: 2 new pulmonary nodule seen in the right lower lobe, largest measuring 1.7 cm, consistent with pulmonary metastases. Hepatobiliary: Marked increase in size of heterogeneously enhancing mass in right hepatic lobe, currently measuring 12.9 x 9.8 cm, compared to 1.8 x 1.3 cm previously. No other liver masses identified. Gallbladder is unremarkable. No evidence of biliary ductal dilatation. Pancreas:  No mass or inflammatory changes. Spleen: Within normal limits in size and appearance. Adrenals/Urinary Tract: No masses identified. A few tiny renal cysts are again seen bilaterally. No evidence of hydronephrosis. Stomach/Bowel: No evidence of obstruction, inflammatory process or abnormal fluid collections. Normal appendix visualized. Vascular/Lymphatic: No pathologically enlarged lymph nodes. No abdominal aortic aneurysm. Aortic atherosclerosis. Reproductive: Prior hysterectomy noted. Adnexal regions are unremarkable in appearance. Other:  None. 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 Associated soft tissue mass is mildly decreased in size, currently measuring 7.5 x 6.1 cm on image 55/2, compared to  8.1 x  7.3 cm previously. No other bone metastases identified. IMPRESSION: 1. 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 2. Mild decrease in size of large lytic bone metastasis and associated soft tissue mass involving the left ilium. 3. Unstable pathologic fracture of the left ilium, without significant change since prior exam. 4. New right lower lobe pulmonary metastases. Electronically Signed   By: Earle Gell M.D.   On: 04/13/2019 12:02    Mikey Bussing, NP 05/02/2019 12:32 PM [name], MD

## 2019-05-02 NOTE — Progress Notes (Signed)
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  History was obtained from the patient who reported that she received chemotherapy on 6/1 without any issues.  Next day on 6/2, she started feeling weak, gas pains, belching off and on and diarrhea for 2 days. Three days ago, she had difficulty swallowing, felt pain in the throat, unable to eat or drink very much.  Last night she felt significantly weak and fell forward while sitting on the toilet and hit her head.  Patient denied any chest pain, shortness of breath or palpitations.  New events last 24 hours / Subjective: States that since we have given her magic mouthwash, her oral pain has improved.  She is tolerating clear liquid diet this morning, complains of hunger.  No fevers last night.  Assessment & Plan:   Principal Problem:   Neutropenia ([place]) Active Problems:   Malignant neoplasm of upper-outer quadrant of left breast in female, estrogen receptor positive ([place])   Hypothyroidism   Hypertension   Breast cancer, left breast ([facility])   Endometrial cancer ([facility])   Anemia, chronic disease   Hypokalemia   Neutropenia -Started on chemotherapy on June 1 for endometrial cancer, metastases to bones and liver -Appreciate oncology consultation -Blood cultures pending -If develops a fever, will start empiric antibiotics  Odynophagia with dehydration -Improved with Magic mouthwash with lidocaine, continue 4 times daily -Slowly advance diet. Continue IVF until PO intake  adequate   Hypothyroidism -Synthroid  GERD -PPI  Normocytic anemia -Secondary to anemia of chronic disease, chemotherapy  Type 2 diabetes -Hemoglobin A1c 6.8 -Sliding-scale insulin   DVT prophylaxis: Subcutaneous heparin Code Status: DNR Family Communication: None Disposition Plan: Pending further improvement in oral intake, fever curve, neutropenia   Consultants:   Oncology  Procedures:   None  Antimicrobials:  Anti-infectives (From admission, onward)   None        Objective: Vitals:   05/01/19 1859 05/01/19 2050 05/02/19 0522 05/02/19 1239  BP: (!) 167/87 133/74 (!) 149/78 (!) 171/83  Pulse: 93 90 83 90  Resp: (!) 23 18 18 19   Temp: 99 F (37.2 C) 97.8 F (36.6 C) 98.4 F (36.9 C) 99.7 F (37.6 C)  TempSrc: Oral Oral Oral Oral  SpO2:  99% 99% 97%  Weight: 64.4 kg     Height: 5\' 1"  (1.549 m)       Intake/Output Summary (Last 24 hours) at 05/02/2019 1310 Last data filed at 05/02/2019 0600 Gross per 24 hour  Intake 2607.47 ml  Output 700 ml  Net 1907.47 ml   Filed Weights   05/01/19 1859  Weight: 64.4 kg    Examination:  General exam: Appears calm and comfortable  Respiratory system: Clear to auscultation. Respiratory effort normal. Cardiovascular system: S1 & S2 heard, RRR. No JVD, murmurs, rubs, gallops or clicks. No pedal edema. Gastrointestinal system: Abdomen is nondistended, soft and nontender. No organomegaly or masses felt. Normal bowel sounds heard. Central nervous system: Alert and oriented. No focal neurological deficits. Extremities: Symmetric 5 x 5 power. Skin: No rashes, lesions or ulcers Psychiatry: Judgement and insight appear  normal. Mood & affect appropriate.   Data Reviewed: I have personally reviewed following labs and imaging studies  CBC: Recent Labs  Lab 05/01/19 1321 05/02/19 0337  WBC 0.4* 0.3*  NEUTROABS 0.1* 0.1*  HGB 9.5* 8.9*  HCT 29.4* 28.1*  MCV 78.2* 80.1  PLT 260 454   Basic Metabolic Panel: Recent  Labs  Lab 05/01/19 1321 05/01/19 1649 05/02/19 0337  NA 132*  --  133*  K 2.8*  --  3.5  CL 92*  --  99  CO2 30  --  26  GLUCOSE 137*  --  225*  BUN 9  --  7*  CREATININE 0.65  --  0.54  CALCIUM 8.6*  --  7.9*  MG  --  1.9  --    GFR: Estimated Creatinine Clearance: 50.6 mL/min (by C-G formula based on SCr of 0.54 mg/dL). Liver Function Tests: Recent Labs  Lab 05/01/19 1321  AST 44*  ALT 28  ALKPHOS 119  BILITOT 0.8  PROT 5.6*  ALBUMIN 3.0*   No results for input(s): LIPASE, AMYLASE in the last 168 hours. No results for input(s): AMMONIA in the last 168 hours. Coagulation Profile: No results for input(s): INR, PROTIME in the last 168 hours. Cardiac Enzymes: No results for input(s): CKTOTAL, CKMB, CKMBINDEX, TROPONINI in the last 168 hours. BNP (last 3 results) No results for input(s): PROBNP in the last 8760 hours. HbA1C: Recent Labs    05/02/19 0337  HGBA1C 6.8*   CBG: Recent Labs  Lab 05/01/19 1319 05/01/19 2112 05/02/19 0736 05/02/19 1137  GLUCAP 131* 133* 224* 132*   Lipid Profile: No results for input(s): CHOL, HDL, LDLCALC, TRIG, CHOLHDL, LDLDIRECT in the last 72 hours. Thyroid Function Tests: No results for input(s): TSH, T4TOTAL, FREET4, T3FREE, THYROIDAB in the last 72 hours. Anemia Panel: No results for input(s): VITAMINB12, FOLATE, FERRITIN, TIBC, IRON, RETICCTPCT in the last 72 hours. Sepsis Labs: No results for input(s): PROCALCITON, LATICACIDVEN in the last 168 hours.  Recent Results (from the past 240 hour(s))  SARS Coronavirus 2 (CEPHEID - Performed in [facility] hospital lab), Hosp Order     Status: None   Collection Time: 05/01/19  5:06 PM  Result Value Ref Range Status   SARS Coronavirus 2 NEGATIVE NEGATIVE Final    Comment: (NOTE) If result is NEGATIVE SARS-CoV-2 target nucleic acids are NOT DETECTED. The SARS-CoV-2 RNA is generally detectable in upper and lower  respiratory specimens during the acute phase of infection. The  lowest  concentration of SARS-CoV-2 viral copies this assay can detect is 250  copies / mL. A negative result does not preclude SARS-CoV-2 infection  and should not be used as the sole basis for treatment or other  patient management decisions.  A negative result may occur with  improper specimen collection / handling, submission of specimen other  than nasopharyngeal swab, presence of viral mutation(s) within the  areas targeted by this assay, and inadequate number of viral copies  (<250 copies / mL). A negative result must be combined with clinical  observations, patient history, and epidemiological information. If result is POSITIVE SARS-CoV-2 target nucleic acids are DETECTED. The SARS-CoV-2 RNA is generally detectable in upper and lower  respiratory specimens dur ing the acute phase of infection.  Positive  results are indicative of active infection with SARS-CoV-2.  Clinical  correlation with patient history and other diagnostic information is  necessary to determine patient infection status.  Positive results do  not rule out bacterial infection or  co-infection with other viruses. If result is PRESUMPTIVE POSTIVE SARS-CoV-2 nucleic acids MAY BE PRESENT.   A presumptive positive result was obtained on the submitted specimen  and confirmed on repeat testing.  While 2019 novel coronavirus  (SARS-CoV-2) nucleic acids may be present in the submitted sample  additional confirmatory testing may be necessary for epidemiological  and / or clinical management purposes  to differentiate between  SARS-CoV-2 and other Sarbecovirus currently known to infect humans.  If clinically indicated additional testing with an alternate test  methodology [phone]) is advised. The SARS-CoV-2 RNA is generally  detectable in upper and lower respiratory sp ecimens during the acute  phase of infection. The expected result is Negative. Fact Sheet for Patients:   StrictlyIdeas.no Fact Sheet for Healthcare Providers: BankingDealers.co.za This test is not yet approved or cleared by the [place] FDA and has been authorized for detection and/or diagnosis of SARS-CoV-2 by FDA under an Emergency Use Authorization (EUA).  This EUA will remain in effect (meaning this test can be used) for the duration of the COVID-19 declaration under Section 564(b)(1) of the Act, 21 U.S.C. section 360bbb-3(b)(1), unless the authorization is terminated or revoked sooner. Performed at [facility], [place] [address], [place], [place] [address]   Culture, blood (Routine X 2) w Reflex to ID Panel     Status: None (Preliminary result)   Collection Time: 05/01/19  5:11 PM  Result Value Ref Range Status   Specimen Description   Final    LEFT ANTECUBITAL Performed at [facility] [address] [place], [place] [address]    Special Requests   Final    BOTTLES DRAWN AEROBIC AND ANAEROBIC Blood Culture adequate volume Performed at [facility] [address] [place], [place] [address]    Culture   Final    NO GROWTH < 12 HOURS Performed at [facility] [address] [place], [place] [address]    Report Status PENDING  Incomplete       Radiology Studies: Ct Head Wo Contrast  Result Date: 05/01/2019 CLINICAL DATA:  Posttraumatic headache after fall today. EXAM: CT HEAD WITHOUT CONTRAST CT CERVICAL SPINE WITHOUT CONTRAST TECHNIQUE: Multidetector CT imaging of the head and cervical spine was performed following the standard protocol without intravenous contrast. Multiplanar CT image reconstructions of the cervical spine were also generated. COMPARISON:  None. FINDINGS: CT HEAD FINDINGS Brain: Mild diffuse cortical atrophy is noted. Mild chronic ischemic white matter disease is noted. No mass effect or midline shift is noted. Ventricular size is within normal  limits. There is no evidence of mass lesion, hemorrhage or acute infarction. Vascular: No hyperdense vessel or unexpected calcification. Skull: Normal. Negative for fracture or focal lesion. Sinuses/Orbits: No acute finding. Other: None. CT CERVICAL SPINE FINDINGS Alignment: Minimal grade 1 anterolisthesis of C4-5 is noted secondary to posterior facet joint hypertrophy. Skull base and vertebrae: No acute fracture. No primary bone lesion or focal pathologic process. Soft tissues and spinal canal: No prevertebral fluid or swelling. No visible canal hematoma. Disc levels: Mild degenerative disc disease is noted at C5-6 with anterior posterior osteophyte formation. There appears to be fusion of the C6-7 disc space most likely due to degenerative change. Upper chest: Negative. Other: Degenerative changes are seen involving posterior facet joints bilaterally. IMPRESSION: Mild diffuse cortical atrophy. Mild chronic ischemic white matter disease. No acute intracranial abnormality seen. Multilevel degenerative disc disease. No acute abnormality seen in the cervical spine. Electronically Signed  By: Marijo Conception M.D.   On: 05/01/2019 15:17   Ct Cervical Spine Wo Contrast  Result Date: 05/01/2019 CLINICAL DATA:  Posttraumatic headache after fall today. EXAM: CT HEAD WITHOUT CONTRAST CT CERVICAL SPINE WITHOUT CONTRAST TECHNIQUE: Multidetector CT imaging of the head and cervical spine was performed following the standard protocol without intravenous contrast. Multiplanar CT image reconstructions of the cervical spine were also generated. COMPARISON:  None. FINDINGS: CT HEAD FINDINGS Brain: Mild diffuse cortical atrophy is noted. Mild chronic ischemic white matter disease is noted. No mass effect or midline shift is noted. Ventricular size is within normal limits. There is no evidence of mass lesion, hemorrhage or acute infarction. Vascular: No hyperdense vessel or unexpected calcification. Skull: Normal. Negative for  fracture or focal lesion. Sinuses/Orbits: No acute finding. Other: None. CT CERVICAL SPINE FINDINGS Alignment: Minimal grade 1 anterolisthesis of C4-5 is noted secondary to posterior facet joint hypertrophy. Skull base and vertebrae: No acute fracture. No primary bone lesion or focal pathologic process. Soft tissues and spinal canal: No prevertebral fluid or swelling. No visible canal hematoma. Disc levels: Mild degenerative disc disease is noted at C5-6 with anterior posterior osteophyte formation. There appears to be fusion of the C6-7 disc space most likely due to degenerative change. Upper chest: Negative. Other: Degenerative changes are seen involving posterior facet joints bilaterally. IMPRESSION: Mild diffuse cortical atrophy. Mild chronic ischemic white matter disease. No acute intracranial abnormality seen. Multilevel degenerative disc disease. No acute abnormality seen in the cervical spine. Electronically Signed   By: Marijo Conception M.D.   On: 05/01/2019 15:17      Scheduled Meds:  dexamethasone  4 mg Intravenous Q24H   docusate sodium  100 mg Oral BID   heparin  5,000 Units Subcutaneous Q8H   insulin aspart  0-5 Units Subcutaneous QHS   insulin aspart  0-9 Units Subcutaneous TID WC   [START ON 05/03/2019] levothyroxine  22 mcg Intravenous Once per day on Mon Wed Fri   levothyroxine  44 mcg Intravenous Once per day on Sun Tue Thu Sat   magic mouthwash w/lidocaine  5 mL Oral QID   pantoprazole (PROTONIX) IV  40 mg Intravenous Q12H   Continuous Infusions:  dextrose 5 % and 0.45 % NaCl with KCl 20 mEq/L 100 mL/hr at 05/02/19 0806     LOS: 0 days    Time spent: 35 minutes   Dessa Phi, DO [facility] [email or web address] 05/02/2019, 1:10 PM

## 2019-05-03 DIAGNOSIS — Z170 Estrogen receptor positive status [ER+]: Secondary | ICD-10-CM

## 2019-05-03 DIAGNOSIS — C50412 Malignant neoplasm of upper-outer quadrant of left female breast: Secondary | ICD-10-CM

## 2019-05-03 DIAGNOSIS — E039 Hypothyroidism, unspecified: Secondary | ICD-10-CM

## 2019-05-03 LAB — URINE CULTURE: Culture: <10000 — AB

## 2019-05-03 LAB — CBC
HCT: 27.6 % — ABNORMAL LOW (ref 36.0–46.0)
Hemoglobin: 8.8 g/dL — ABNORMAL LOW (ref 12.0–15.0)
MCH: 25.5 pg — ABNORMAL LOW (ref 26.0–34.0)
MCHC: 31.9 g/dL (ref 30.0–36.0)
MCV: 80 fL (ref 80.0–100.0)
Platelets: 270 10*3/uL (ref 150–400)
RBC: 3.45 MIL/uL — ABNORMAL LOW (ref 3.87–5.11)
RDW: 19.9 % — ABNORMAL HIGH (ref 11.5–15.5)
WBC: 0.4 10*3/uL — CL (ref 4.0–10.5)
nRBC: 14.3 % — ABNORMAL HIGH (ref 0.0–0.2)

## 2019-05-03 LAB — GLUCOSE, CAPILLARY
Glucose-Capillary: 198 mg/dL — ABNORMAL HIGH (ref 70–99)
Glucose-Capillary: 107 mg/dL — ABNORMAL HIGH (ref 70–99)

## 2019-05-03 LAB — BASIC METABOLIC PANEL
Anion gap: 6 (ref 5–15)
BUN: 6 mg/dL — ABNORMAL LOW (ref 8–23)
CO2: 26 mmol/L (ref 22–32)
Calcium: 8 mg/dL — ABNORMAL LOW (ref 8.9–10.3)
Chloride: 101 mmol/L (ref 98–111)
Creatinine, Ser: 0.55 mg/dL (ref 0.44–1.00)
GFR calc Af Amer: >60 mL/min (ref >60)
GFR calc non Af Amer: >60 mL/min (ref >60)
Glucose, Bld: 247 mg/dL — ABNORMAL HIGH (ref 70–99)
Potassium: 3.9 mmol/L (ref 3.5–5.1)
Sodium: 133 mmol/L — ABNORMAL LOW (ref 135–145)

## 2019-05-03 LAB — MAGNESIUM: Magnesium: 1.9 mg/dL (ref 1.7–2.4)

## 2019-05-03 LAB — PHOSPHORUS: Phosphorus: 2 mg/dL — ABNORMAL LOW (ref 2.5–4.6)

## 2019-05-03 MED ORDER — HEPARIN SOD (PORK) LOCK FLUSH 100 UNIT/ML IV SOLN
500.0000 [IU] | INTRAVENOUS | Status: AC | PRN
  Administered 2019-05-03: 500 [IU]

## 2019-05-03 MED ORDER — DEXAMETHASONE 2 MG PO TABS
2.0000 mg | ORAL_TABLET | Freq: Every day | ORAL | Status: DC
  Administered 2019-05-03: 2 mg via ORAL
  Filled 2019-05-03: qty 1

## 2019-05-03 MED ORDER — PANTOPRAZOLE SODIUM 40 MG PO TBEC: 40.0000 mg | DELAYED_RELEASE_TABLET | Freq: Two times a day (BID) | ORAL | 0 refills | Status: DC

## 2019-05-03 MED ORDER — SIMETHICONE 40 MG/0.6ML PO SUSP: 20.0000 mg | Freq: Four times a day (QID) | ORAL | 0 refills | Status: DC | PRN

## 2019-05-03 NOTE — Progress Notes (Signed)
Tammy Hensley   DOB:[birth year]   GP#:[id number]    ASSESSMENT & PLAN:  Endometrial cancer ([place]) Currently on Taxotere - last dose 04/24/2019  I recommend minimum 3 cycles of treatment before repeat imaging. Planned future dose reduction due to poor tolerance to treatment Continue supportive care  Neutropenia secondary to chemotherapy The patient is afebrile She has no signs of infection Recommend continue monitoring of her white blood cell count and proceed with antibiotics if she develops a fever Blood cultures have been drawn and are pending, so far negative No need GCSF for now  Dehydration, resolved The patient is feeling much better with IV fluids Oral intake has improved We will discontinue IV fluids Encourage advance diet as tolerated  Mucositis secondary to chemotherapy, resolved Improved with Magic mouthwash with lidocaine We will discontinue Magic mouthwash  Hypokalemia Replete per hospitalist  Anemia due to antineoplastic chemotherapy The pattern of anemia is not consistent with iron deficiency, more like anemia chronic illness likely secondary to active disease She does not need iron infusion or blood transfusion We will monitor closely while she is on treatment  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 I will call her and check on her the next 2 days and will see her in the outpatient clinic as needed We will sign off I will call and update her son  All questions were answered. The patient knows to call the clinic with any  problems, questions or concerns.   [name], MD 05/03/2019 7:41 AM  Subjective:  She continues to improve daily.  She was able to swallow soft diet without difficulties. Severe constipation has resolved.  Her back pain is stable/improved The patient felt strong enough to go home.  Objective:  Vitals:   05/02/19 2138 05/03/19 0538  BP: (!) 149/77 (!) 142/81  Pulse: 97 84  Resp: 20 18  Temp: 99.6 F (37.6 C) 98.3 F (36.8 C)  SpO2: 100% 99%     Intake/Output Summary (Last 24 hours) at 05/03/2019 0741 Last data filed at 05/03/2019 0455 Gross per 24 hour  Intake 2029.67 ml  Output 1502 ml  Net 527.67 ml    GENERAL:alert, no distress and comfortable SKIN: skin color, texture, turgor are normal, no rashes or significant lesions EYES: normal, Conjunctiva are pink and non-injected, sclera clear OROPHARYNX:no exudate, no erythema and lips, buccal mucosa, and tongue normal  NECK: supple, thyroid normal size, non-tender, without nodularity LYMPH:  no palpable lymphadenopathy in the cervical, axillary or inguinal LUNGS: clear to auscultation and percussion with normal breathing effort HEART: regular rate & rhythm and no murmurs and no lower extremity edema ABDOMEN:abdomen soft, non-tender and normal bowel sounds Musculoskeletal:no cyanosis of digits and no clubbing  NEURO: alert & oriented x 3 with fluent speech, no focal motor/sensory deficits   Labs:  Recent Labs    04/05/19 1154 04/24/19 1210 05/01/19 1321 05/02/19 0337 05/03/19 0453  NA 139 137 132* 133* 133*  K 3.5 3.8 2.8* 3.5 3.9  CL 102 100 92* 99 101  CO2 27 26 30 26 26   GLUCOSE 94 199*  137* 225* 247*  BUN 13 16 9  7* 6*  CREATININE 0.73 0.81 0.65 0.54 0.55  CALCIUM 9.8 9.1 8.6* 7.9* 8.0*  GFRNONAA >60 >60 >60 >60 >60  GFRAA >60 >60 >60 >60 >60  PROT 7.3 6.8 5.6*  --   --   ALBUMIN 3.0* 3.4* 3.0*  --   --   AST 20 32 44*  --   --   ALT 13 36 28  --   --   ALKPHOS 75 121 119  --   --   BILITOT 0.3 0.5 0.8   --   --   BILIDIR  --   --  0.3*  --   --   IBILI  --   --  0.5  --   --     Studies:  Ct Head Wo Contrast  Result Date: 05/01/2019 CLINICAL DATA:  Posttraumatic headache after fall today. EXAM: CT HEAD WITHOUT CONTRAST CT CERVICAL SPINE WITHOUT CONTRAST TECHNIQUE: Multidetector CT imaging of the head and cervical spine was performed following the standard protocol without intravenous contrast. Multiplanar CT image reconstructions of the cervical spine were also generated. COMPARISON:  None. FINDINGS: CT HEAD FINDINGS Brain: Mild diffuse cortical atrophy is noted. Mild chronic ischemic white matter disease is noted. No mass effect or midline shift is noted. Ventricular size is within normal limits. There is no evidence of mass lesion, hemorrhage or acute infarction. Vascular: No hyperdense vessel or unexpected calcification. Skull: Normal. Negative for fracture or focal lesion. Sinuses/Orbits: No acute finding. Other: None. CT CERVICAL SPINE FINDINGS Alignment: Minimal grade 1 anterolisthesis of C4-5 is noted secondary to posterior facet joint hypertrophy. Skull base and vertebrae: No acute fracture. No primary bone lesion or focal pathologic process. Soft tissues and spinal canal: No prevertebral fluid or swelling. No visible canal hematoma. Disc levels: Mild degenerative disc disease is noted at C5-6 with anterior posterior osteophyte formation. There appears to be fusion of the C6-7 disc space most likely due to degenerative change. Upper chest: Negative. Other: Degenerative changes are seen involving posterior facet joints bilaterally. IMPRESSION: Mild diffuse cortical atrophy. Mild chronic ischemic white matter disease. No acute intracranial abnormality seen. Multilevel degenerative disc disease. No acute abnormality seen in the cervical spine. Electronically Signed   By: Marijo Conception M.D.   On: 05/01/2019 15:17   Ct Cervical Spine Wo Contrast  Result Date: 05/01/2019 CLINICAL DATA:  Posttraumatic  headache after fall today. EXAM: CT HEAD WITHOUT CONTRAST CT CERVICAL SPINE WITHOUT CONTRAST TECHNIQUE: Multidetector CT imaging of the head and cervical spine was performed following the standard protocol without intravenous contrast. Multiplanar CT image reconstructions of the cervical spine were also generated. COMPARISON:  None. FINDINGS: CT HEAD FINDINGS Brain: Mild diffuse cortical atrophy is noted. Mild chronic ischemic white matter disease is noted. No mass effect or midline shift is noted. Ventricular size is within normal limits. There is no evidence of mass lesion, hemorrhage or acute infarction. Vascular: No hyperdense vessel or unexpected calcification. Skull: Normal. Negative for fracture or focal lesion. Sinuses/Orbits: No acute finding. Other: None. CT CERVICAL SPINE FINDINGS Alignment: Minimal grade 1 anterolisthesis of C4-5 is noted secondary to posterior facet joint hypertrophy. Skull base and vertebrae: No acute fracture. No primary bone lesion or focal pathologic process. Soft tissues and spinal canal: No prevertebral fluid or swelling. No visible canal hematoma. Disc levels: Mild degenerative disc disease is noted at C5-6 with anterior posterior osteophyte formation. There appears to be fusion of the C6-7 disc space  most likely due to degenerative change. Upper chest: Negative. Other: Degenerative changes are seen involving posterior facet joints bilaterally. IMPRESSION: Mild diffuse cortical atrophy. Mild chronic ischemic white matter disease. No acute intracranial abnormality seen. Multilevel degenerative disc disease. No acute abnormality seen in the cervical spine. Electronically Signed   By: Marijo Conception M.D.   On: 05/01/2019 15:17   Ct Abdomen Pelvis W Contrast  Result Date: 04/13/2019 CLINICAL DATA:  Follow-up metastatic endometrial carcinoma. Previous surgery, chemotherapy, and radiation therapy. EXAM: CT ABDOMEN AND PELVIS WITH CONTRAST TECHNIQUE: Multidetector CT imaging of the  abdomen and pelvis was performed using the standard protocol following bolus administration of intravenous contrast. CONTRAST:  141mL OMNIPAQUE IOHEXOL 300 MG/ML  SOLN COMPARISON:  02/15/2019 FINDINGS: Lower Chest: 2 new pulmonary nodule seen in the right lower lobe, largest measuring 1.7 cm, consistent with pulmonary metastases. Hepatobiliary: Marked increase in size of heterogeneously enhancing mass in right hepatic lobe, currently measuring 12.9 x 9.8 cm, compared to 1.8 x 1.3 cm previously. No other liver masses identified. Gallbladder is unremarkable. No evidence of biliary ductal dilatation. Pancreas:  No mass or inflammatory changes. Spleen: Within normal limits in size and appearance. Adrenals/Urinary Tract: No masses identified. A few tiny renal cysts are again seen bilaterally. No evidence of hydronephrosis. Stomach/Bowel: No evidence of obstruction, inflammatory process or abnormal fluid collections. Normal appendix visualized. Vascular/Lymphatic: No pathologically enlarged lymph nodes. No abdominal aortic aneurysm. Aortic atherosclerosis. Reproductive: Prior hysterectomy noted. Adnexal regions are unremarkable in appearance. Other:  None. 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 Associated soft tissue mass is mildly decreased in size, currently measuring 7.5 x 6.1 cm on image 55/2, compared to 8.1 x 7.3 cm previously. No other bone metastases identified. IMPRESSION: 1. 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 2. Mild decrease in size of large lytic bone metastasis and associated soft tissue mass involving the left ilium. 3. Unstable pathologic fracture of the left ilium, without significant change since prior exam. 4. New right lower lobe pulmonary metastases. Electronically Signed   By: Earle Gell M.D.   On: 04/13/2019 12:02

## 2019-05-03 NOTE — Discharge Summary (Signed)
Discharge Summary  Tammy Hensley IRW:[id number] DOB: 03-23-1941  PCP: [name], MD  Admit date: 05/01/2019 Discharge date: 05/03/2019  Time spent: 40 mins  Recommendations for Outpatient Follow-up:  1. Follow up with Dr Alvy Bimler- Dr Alvy Bimler will call in 2 days time to follow up 2. PCP  Discharge Diagnoses:  Active Hospital Problems   Diagnosis Date Noted   Neutropenia ([place]) 05/01/2019   Hypokalemia 05/01/2019   Anemia, chronic disease 04/07/2019   Endometrial cancer ([place]) 09/30/2016   Breast cancer, left breast ([place]) 12/19/2013   Hypertension    Hypothyroidism    Malignant neoplasm of upper-outer quadrant of left breast in female, estrogen receptor positive ([place]) 12/05/2012    Resolved Hospital Problems  No resolved problems to display.    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 History was obtained from the patient who reported that she received chemotherapy on 6/1 without any issues. On 6/2,she started feeling weak, gas pains, belching off and on and diarrhea for 2 days. Three days ago, she had difficulty swallowing, felt pain in the throat, unable to eat or drink very much. PTA she felt significantly weak and fell forward while sitting on the toilet and hit her head. Patient denied any chest pain, shortness of breath or palpitations.  Today, pt reported feeling  much better, denies any chest pain, SOB, abdominal pain, N/V/D, fever/chills. Stable to be discharged from my end and also from oncology standpoint.  Hospital Course:  Principal Problem:   Neutropenia ([place]) Active Problems:   Malignant neoplasm of upper-outer quadrant of left breast in female, estrogen receptor positive ([place])   Hypothyroidism   Hypertension   Breast cancer, left breast ([facility])   Endometrial cancer ([facility])   Anemia, chronic disease   Hypokalemia  Neutropenia likely 2/2 recent chemotherapy -Started on chemotherapy on June 1 for endometrial cancer, metastases to bones and liver -Afebrile, no signs of infection -Blood cultures NGTD -UC with <10,000 insignificant growth -Oncology Dr Alvy Bimler on board, ok with d/c with very close follow up with patient  Mucositis/Odynophagia with dehydration likely 2/2 chemo -Improved with Magic mouthwash with lidocaine -Tolerating advanced diet -Advised to advance as toerated  Hypokalemia Resolved  Hypothyroidism -Synthroid  GERD -PPI  Normocytic anemia -Secondary to anemia of chronic disease, chemotherapy  Type 2 diabetes -Hemoglobin A1c 6.8 -Continue home regimen         Malnutrition Type:      Malnutrition Characteristics:      Nutrition Interventions:      Estimated body mass index is 26.83 kg/m as calculated from the following:   Height as of this encounter: 5\' 1"  (1.549 m).   Weight as of this encounter: 64.4 kg.    Procedures:  None  Consultations:  Oncology  Discharge Exam: BP (!) 142/81 (BP Location: Left Arm)    Pulse 84    Temp 98.3 F (36.8 C) (Oral)    Resp 18  Ht 5\' 1"  (1.549 m)    Wt 64.4 kg Comment: per pt   SpO2 99%    BMI 26.83 kg/m   General: NAD  Cardiovascular: S1, S2 present Respiratory: CTAB   Discharge Instructions You were cared for by a hospitalist during your hospital stay. If you have any questions about your discharge medications or the care you received  while you were in the hospital after you are discharged, you can call the unit and asked to speak with the hospitalist on call if the hospitalist that took care of you is not available. Once you are discharged, your primary care physician will handle any further medical issues. Please note that NO REFILLS for any discharge medications will be authorized once you are discharged, as it is imperative that you return to your primary care physician (or establish a relationship with a primary care physician if you do not have one) for your aftercare needs so that they can reassess your need for medications and monitor your lab values.   Allergies as of 05/03/2019      Reactions   Omnipaque [iohexol] Hives   Pt will need 13 hr prep in the future--SLG   Calan [verapamil]    Constipation   Effexor [venlafaxine]    unknown   Erythromycin Nausea And Vomiting   Femara [letrozole] Swelling   Fosamax [alendronate]    aching   Ibuprofen    Dizziness, incoherent    Paxil [paroxetine Hcl] Nausea Only   Remeron [mirtazapine]    Weight gain   Vasotec [enalapril] Cough      Medication List    TAKE these medications   acetaminophen 500 MG tablet Commonly known as:  TYLENOL Take 500 mg by mouth daily as needed for moderate pain or headache.   aspirin 81 MG tablet Take 81 mg by mouth daily.   CALCIUM 600 + D PO Take 1 tablet by mouth 2 (two) times daily.   Claritin-D 24 Hour 10-240 MG 24 hr tablet Generic drug:  loratadine-pseudoephedrine Take 1 tablet by mouth daily as needed for allergies.   dexamethasone 4 MG tablet Commonly known as:  DECADRON Take 1 tablet (4 mg total) by mouth daily.   furosemide 20 MG tablet Commonly known as:  Lasix Once daily for 3 days, repeat as needed for lower extremity edema What changed:    how much to take  how to take this  when to take this  reasons to take this  additional instructions   levothyroxine 88 MCG tablet Commonly known as:   SYNTHROID Take 1 tablet (88 mcg total) by mouth daily before breakfast. What changed:    how much to take  when to take this  additional instructions   metFORMIN 500 MG 24 hr tablet Commonly known as:  GLUCOPHAGE-XR Take 1 tablet (500 mg total) by mouth 2 (two) times daily. What changed:  when to take this   multivitamin with minerals Tabs tablet Take 1 tablet by mouth daily.   ondansetron 8 MG tablet Commonly known as:  Zofran Take 1 tablet (8 mg total) by mouth every 8 (eight) hours as needed for refractory nausea / vomiting. Start on day 3 after chemo.   pantoprazole 40 MG tablet Commonly known as:  PROTONIX Take 1 tablet (40 mg total) by mouth 2 (two) times daily for 30 days.   pravastatin 40 MG tablet Commonly known as:  PRAVACHOL Take 1 tablet (40 mg total) by mouth every evening.   prochlorperazine 10 MG  tablet Commonly known as:  COMPAZINE Take 1 tablet (10 mg total) by mouth every 6 (six) hours as needed (Nausea or vomiting).   simethicone 40 MG/0.6ML drops Commonly known as:  MYLICON Take 0.3 mLs (20 mg total) by mouth 4 (four) times daily as needed for flatulence.   traMADol 50 MG tablet Commonly known as:  Ultram Take 1 tablet (50 mg total) by mouth 3 (three) times daily. Take 1/2 to 1 tablet every 4 hours as needed for severe Back Pain What changed:    how much to take  when to take this  reasons to take this   triamterene-hydrochlorothiazide 75-50 MG tablet Commonly known as:  MAXZIDE Take 0.5 tablets by mouth daily.   Vitamin D 50 MCG (2000 UT) Caps Take 2,000 Units by mouth every other day.      Allergies  Allergen Reactions   Omnipaque [Iohexol] Hives    Pt will need 13 hr prep in the future--SLG   Calan [Verapamil]     Constipation    Effexor [Venlafaxine]     unknown   Erythromycin Nausea And Vomiting   Femara [Letrozole] Swelling   Fosamax [Alendronate]     aching   Ibuprofen     Dizziness, incoherent    Paxil  [Paroxetine Hcl] Nausea Only   Remeron [Mirtazapine]     Weight gain    Vasotec [Enalapril] Cough   Follow-up Information    [name], MD. Schedule an appointment as soon as possible for a visit in 1 week(s).   Specialty:  Internal Medicine Contact information: [address] [place]  [address] [phone]            The results of significant diagnostics from this hospitalization (including imaging, microbiology, ancillary and laboratory) are listed below for reference.    Significant Diagnostic Studies: Ct Head Wo Contrast  Result Date: 05/01/2019 CLINICAL DATA:  Posttraumatic headache after fall today. EXAM: CT HEAD WITHOUT CONTRAST CT CERVICAL SPINE WITHOUT CONTRAST TECHNIQUE: Multidetector CT imaging of the head and cervical spine was performed following the standard protocol without intravenous contrast. Multiplanar CT image reconstructions of the cervical spine were also generated. COMPARISON:  None. FINDINGS: CT HEAD FINDINGS Brain: Mild diffuse cortical atrophy is noted. Mild chronic ischemic white matter disease is noted. No mass effect or midline shift is noted. Ventricular size is within normal limits. There is no evidence of mass lesion, hemorrhage or acute infarction. Vascular: No hyperdense vessel or unexpected calcification. Skull: Normal. Negative for fracture or focal lesion. Sinuses/Orbits: No acute finding. Other: None. CT CERVICAL SPINE FINDINGS Alignment: Minimal grade 1 anterolisthesis of C4-5 is noted secondary to posterior facet joint hypertrophy. Skull base and vertebrae: No acute fracture. No primary bone lesion or focal pathologic process. Soft tissues and spinal canal: No prevertebral fluid or swelling. No visible canal hematoma. Disc levels: Mild degenerative disc disease is noted at C5-6 with anterior posterior osteophyte formation. There appears to be fusion of the C6-7 disc space most likely due to degenerative change. Upper  chest: Negative. Other: Degenerative changes are seen involving posterior facet joints bilaterally. IMPRESSION: Mild diffuse cortical atrophy. Mild chronic ischemic white matter disease. No acute intracranial abnormality seen. Multilevel degenerative disc disease. No acute abnormality seen in the cervical spine. Electronically Signed   By: Marijo Conception M.D.   On: 05/01/2019 15:17   Ct Cervical Spine Wo Contrast  Result Date: 05/01/2019 CLINICAL DATA:  Posttraumatic headache after fall today. EXAM: CT HEAD WITHOUT CONTRAST CT  CERVICAL SPINE WITHOUT CONTRAST TECHNIQUE: Multidetector CT imaging of the head and cervical spine was performed following the standard protocol without intravenous contrast. Multiplanar CT image reconstructions of the cervical spine were also generated. COMPARISON:  None. FINDINGS: CT HEAD FINDINGS Brain: Mild diffuse cortical atrophy is noted. Mild chronic ischemic white matter disease is noted. No mass effect or midline shift is noted. Ventricular size is within normal limits. There is no evidence of mass lesion, hemorrhage or acute infarction. Vascular: No hyperdense vessel or unexpected calcification. Skull: Normal. Negative for fracture or focal lesion. Sinuses/Orbits: No acute finding. Other: None. CT CERVICAL SPINE FINDINGS Alignment: Minimal grade 1 anterolisthesis of C4-5 is noted secondary to posterior facet joint hypertrophy. Skull base and vertebrae: No acute fracture. No primary bone lesion or focal pathologic process. Soft tissues and spinal canal: No prevertebral fluid or swelling. No visible canal hematoma. Disc levels: Mild degenerative disc disease is noted at C5-6 with anterior posterior osteophyte formation. There appears to be fusion of the C6-7 disc space most likely due to degenerative change. Upper chest: Negative. Other: Degenerative changes are seen involving posterior facet joints bilaterally. IMPRESSION: Mild diffuse cortical atrophy. Mild chronic ischemic  white matter disease. No acute intracranial abnormality seen. Multilevel degenerative disc disease. No acute abnormality seen in the cervical spine. Electronically Signed   By: Marijo Conception M.D.   On: 05/01/2019 15:17   Ct Abdomen Pelvis W Contrast  Result Date: 04/13/2019 CLINICAL DATA:  Follow-up metastatic endometrial carcinoma. Previous surgery, chemotherapy, and radiation therapy. EXAM: CT ABDOMEN AND PELVIS WITH CONTRAST TECHNIQUE: Multidetector CT imaging of the abdomen and pelvis was performed using the standard protocol following bolus administration of intravenous contrast. CONTRAST:  19mL OMNIPAQUE IOHEXOL 300 MG/ML  SOLN COMPARISON:  02/15/2019 FINDINGS: Lower Chest: 2 new pulmonary nodule seen in the right lower lobe, largest measuring 1.7 cm, consistent with pulmonary metastases. Hepatobiliary: Marked increase in size of heterogeneously enhancing mass in right hepatic lobe, currently measuring 12.9 x 9.8 cm, compared to 1.8 x 1.3 cm previously. No other liver masses identified. Gallbladder is unremarkable. No evidence of biliary ductal dilatation. Pancreas:  No mass or inflammatory changes. Spleen: Within normal limits in size and appearance. Adrenals/Urinary Tract: No masses identified. A few tiny renal cysts are again seen bilaterally. No evidence of hydronephrosis. Stomach/Bowel: No evidence of obstruction, inflammatory process or abnormal fluid collections. Normal appendix visualized. Vascular/Lymphatic: No pathologically enlarged lymph nodes. No abdominal aortic aneurysm. Aortic atherosclerosis. Reproductive: Prior hysterectomy noted. Adnexal regions are unremarkable in appearance. Other:  None. 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 Associated soft tissue mass is mildly decreased in size, currently measuring 7.5 x 6.1 cm on  image 55/2, compared to 8.1 x 7.3 cm previously. No other bone metastases identified. IMPRESSION: 1. 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 2. Mild decrease in size of large lytic bone metastasis and associated soft tissue mass involving the left ilium. 3. Unstable pathologic fracture of the left ilium, without significant change since prior exam. 4. New right lower lobe pulmonary metastases. Electronically Signed   By: Earle Gell M.D.   On: 04/13/2019 12:02    Microbiology: Recent Results (from the past 240 hour(s))  SARS Coronavirus 2 (CEPHEID - Performed in [facility] hospital lab), Hosp Order     Status: None   Collection Time: 05/01/19  5:06 PM  Result Value Ref Range Status   SARS Coronavirus 2 NEGATIVE NEGATIVE Final    Comment: (NOTE) If result is NEGATIVE SARS-CoV-2 target nucleic acids are NOT DETECTED. The SARS-CoV-2 RNA is generally detectable in upper and lower  respiratory specimens during the acute phase of infection. The lowest  concentration of SARS-CoV-2 viral copies this assay can detect is 250  copies / mL. A negative result does not preclude SARS-CoV-2 infection  and should not be used as the sole basis for treatment or other  patient management decisions.  A negative result may occur with  improper specimen collection / handling, submission of specimen other  than nasopharyngeal swab, presence of viral mutation(s) within the  areas targeted by this assay, and inadequate number of viral copies  (<250 copies / mL). A negative result must be combined with clinical  observations, patient history, and epidemiological information. If result is POSITIVE SARS-CoV-2 target nucleic acids are DETECTED. The SARS-CoV-2 RNA is generally detectable in upper and lower  respiratory specimens dur ing the acute phase of infection.  Positive  results are indicative of active infection with SARS-CoV-2.  Clinical  correlation with patient history and other diagnostic  information is  necessary to determine patient infection status.  Positive results do  not rule out bacterial infection or co-infection with other viruses. If result is PRESUMPTIVE POSTIVE SARS-CoV-2 nucleic acids MAY BE PRESENT.   A presumptive positive result was obtained on the submitted specimen  and confirmed on repeat testing.  While 2019 novel coronavirus  (SARS-CoV-2) nucleic acids may be present in the submitted sample  additional confirmatory testing may be necessary for epidemiological  and / or clinical management purposes  to differentiate between  SARS-CoV-2 and other Sarbecovirus currently known to infect humans.  If clinically indicated additional testing with an alternate test  methodology [phone]) is advised. The SARS-CoV-2 RNA is generally  detectable in upper and lower respiratory sp ecimens during the acute  phase of infection. The expected result is Negative. Fact Sheet for Patients:  StrictlyIdeas.no Fact Sheet for Healthcare Providers: BankingDealers.co.za This test is not yet approved or cleared by the [place] FDA and has been authorized for detection and/or diagnosis of SARS-CoV-2 by FDA under an Emergency Use Authorization (EUA).  This EUA will remain in effect (meaning this test can be used) for the duration of the COVID-19 declaration under Section 564(b)(1) of the Act, 21 U.S.C. section 360bbb-3(b)(1), unless the authorization is terminated or revoked sooner. Performed at [facility], [place] [address] [place], [place] [address]   Culture, blood (Routine X 2) w Reflex to ID Panel     Status: None (Preliminary result)   Collection Time: 05/01/19  5:11 PM  Result Value Ref Range Status   Specimen Description   Final    LEFT ANTECUBITAL Performed at [facility] [address], [place], [place] [address]    Special Requests   Final    BOTTLES DRAWN AEROBIC AND  ANAEROBIC Blood Culture adequate volume Performed at [facility] [address] [place], [place] [address]    Culture   Final    NO GROWTH 2 DAYS Performed at [facility] [address], [place],  [address]    Report Status PENDING  Incomplete  Urine culture     Status: Abnormal   Collection Time: 05/01/19  7:40 PM  Result Value Ref Range Status   Specimen Description   Final    Urine Performed  at [facility], [place] [address], [place], [place] [address]    Special Requests   Final    NONE Performed at [facility], [place] [address] [place], [place] [address]    Culture (A)  Final    <10,000 COLONIES/mL INSIGNIFICANT GROWTH Performed at [facility] [address] [place], [place] [address]    Report Status 05/03/2019 FINAL  Final  Culture, blood (routine x 2)     Status: None (Preliminary result)   Collection Time: 05/02/19 12:53 PM  Result Value Ref Range Status   Specimen Description   Final    BLOOD PORTA CATH Performed at [facility] [address], [place], [place] [address]    Special Requests   Final    BOTTLES DRAWN AEROBIC AND ANAEROBIC Blood Culture adequate volume Performed at [facility] [address] [place], [place] [address]    Culture   Final    NO GROWTH < 24 HOURS Performed at [facility] [address] [place],  [address]    Report Status PENDING  Incomplete     Labs: Basic Metabolic Panel: Recent Labs  Lab 05/01/19 1321 05/01/19 1649 05/02/19 0337 05/03/19 0453  NA 132*  --  133* 133*  K 2.8*  --  3.5 3.9  CL 92*  --  99 101  CO2 30  --  26 26  GLUCOSE 137*  --  225* 247*  BUN 9  --  7* 6*  CREATININE 0.65  --  0.54 0.55  CALCIUM 8.6*  --  7.9* 8.0*  MG  --  1.9  --  1.9  PHOS  --   --   --  2.0*   Liver Function Tests: Recent Labs  Lab 05/01/19 1321  AST 44*  ALT 28  ALKPHOS 119  BILITOT  0.8  PROT 5.6*  ALBUMIN 3.0*   No results for input(s): LIPASE, AMYLASE in the last 168 hours. No results for input(s): AMMONIA in the last 168 hours. CBC: Recent Labs  Lab 05/01/19 1321 05/02/19 0337 05/03/19 0453  WBC 0.4* 0.3* 0.4*  NEUTROABS 0.1* 0.1*  --   HGB 9.5* 8.9* 8.8*  HCT 29.4* 28.1* 27.6*  MCV 78.2* 80.1 80.0  PLT 260 261 270   Cardiac Enzymes: No results for input(s): CKTOTAL, CKMB, CKMBINDEX, TROPONINI in the last 168 hours. BNP: BNP (last 3 results) No results for input(s): BNP in the last 8760 hours.  ProBNP (last 3 results) No results for input(s): PROBNP in the last 8760 hours.  CBG: Recent Labs  Lab 05/02/19 1137 05/02/19 1629 05/02/19 2141 05/03/19 0741 05/03/19 1211  GLUCAP 132* 162* 131* 198* 107*       Signed:  Alma Friendly, MD [facility] 05/03/2019, 1:26 PM

## 2019-05-04 ENCOUNTER — Telehealth: Payer: Self-pay | Admitting: *Deleted

## 2019-05-04 NOTE — Telephone Encounter (Signed)
Called patient on 05/04/2019 , 2:15 PM in an attempt to reach the patient for a hospital follow up.   Admit date: 05/01/19 Discharge: 05/03/19   She does not have any questions or concerns about medications from the hospital admission. The patient's medications were reviewed over the phone, they were counseled to bring in all current medications to the hospital follow up visit.   I advised the patient to call if any questions or concerns arise about the hospital admission or medications    Home health was not started in the hospital.  All questions were answered and a follow up appointment was made. The patient has an appointment on 05/09/2019 for a telephone visit with Kyra McClanahan,NP.  Prior to Admission medications   Medication Sig Start Date End Date Taking? Authorizing Provider  acetaminophen (TYLENOL) 500 MG tablet Take 500 mg by mouth daily as needed for moderate pain or headache.    [provider]  aspirin 81 MG tablet Take 81 mg by mouth daily.      [provider]  Calcium Carbonate-Vitamin D (CALCIUM 600 + D PO) Take 1 tablet by mouth 2 (two) times daily.     [provider]  Cholecalciferol (VITAMIN D) 2000 units CAPS Take 2,000 Units by mouth every other day.    [provider]  dexamethasone (DECADRON) 4 MG tablet Take 1 tablet (4 mg total) by mouth daily. 04/07/19   [name], MD  furosemide (LASIX) 20 MG tablet Once daily for 3 days, repeat as needed for lower extremity edema Patient taking differently: Take 20 mg by mouth daily as needed for fluid or edema.  10/18/18   Tanner, Lyndon Code., PA-C  levothyroxine (SYNTHROID, LEVOTHROID) 88 MCG tablet Take 1 tablet (88 mcg total) by mouth daily before breakfast. Patient taking differently: Take 44-88 mcg by mouth as directed. Take 1/2 tablet (44 mcg) on MWF and Take 1 tablet (88 mcg) all other days 05/04/17   [name], MD  loratadine-pseudoephedrine (CLARITIN-D 24 HOUR) 10-240 MG 24 hr tablet  Take 1 tablet by mouth daily as needed for allergies.    [provider]  metFORMIN (GLUCOPHAGE-XR) 500 MG 24 hr tablet Take 1 tablet (500 mg total) by mouth 2 (two) times daily. Patient taking differently: Take 500 mg by mouth 3 (three) times daily.  12/06/18   [name], MD  Multiple Vitamin (MULTIVITAMIN WITH MINERALS) TABS tablet Take 1 tablet by mouth daily.    [provider]  ondansetron (ZOFRAN) 8 MG tablet Take 1 tablet (8 mg total) by mouth every 8 (eight) hours as needed for refractory nausea / vomiting. Start on day 3 after chemo. Patient not taking: Reported on 03/21/2019 09/15/18   [name], MD  pantoprazole (PROTONIX) 40 MG tablet Take 1 tablet (40 mg total) by mouth 2 (two) times daily for 30 days. 05/03/19 06/02/19  Alma Friendly, MD  pravastatin (PRAVACHOL) 40 MG tablet Take 1 tablet (40 mg total) by mouth every evening. 03/02/18   [name], MD  prochlorperazine (COMPAZINE) 10 MG tablet Take 1 tablet (10 mg total) by mouth every 6 (six) hours as needed (Nausea or vomiting). Patient not taking: Reported on 03/21/2019 09/15/18   [name], MD  simethicone (MYLICON) 40 IE/3.3IR drops Take 0.3 mLs (20 mg total) by mouth 4 (four) times daily as needed for flatulence. 05/03/19   Alma Friendly, MD  traMADol (ULTRAM) 50 MG tablet Take 1 tablet (50 mg total) by mouth 3 (three) times  daily. Take 1/2 to 1 tablet every 4 hours as needed for severe Back Pain Patient taking differently: Take 25 mg by mouth every 4 (four) hours as needed for moderate pain or severe pain. Take 1/2 to 1 tablet every 4 hours as needed for severe Back Pain 02/20/19   [name], MD  triamterene-hydrochlorothiazide (MAXZIDE) 75-50 MG tablet Take 0.5 tablets by mouth daily.  01/14/19   [provider]

## 2019-05-05 ENCOUNTER — Telehealth: Payer: Self-pay | Admitting: Hematology and Oncology

## 2019-05-05 NOTE — Telephone Encounter (Signed)
I spoke with the patient over the telephone She denies mucositis Her back pain is improved Her appetite is great She denies constipation Overall, she feels good, eating and drinking well She does not feel that she needs to come in for IV fluid support I will call her again next week for further follow-up.

## 2019-05-06 LAB — CULTURE, BLOOD (ROUTINE X 2)
Culture: NO GROWTH
Special Requests: ADEQUATE

## 2019-05-07 LAB — CULTURE, BLOOD (ROUTINE X 2)
Culture: NO GROWTH
Special Requests: ADEQUATE

## 2019-05-08 ENCOUNTER — Inpatient Hospital Stay (HOSPITAL_BASED_OUTPATIENT_CLINIC_OR_DEPARTMENT_OTHER): Payer: Medicare Other | Admitting: Hematology and Oncology

## 2019-05-08 ENCOUNTER — Telehealth: Payer: Self-pay | Admitting: Hematology and Oncology

## 2019-05-08 ENCOUNTER — Encounter: Payer: Self-pay | Admitting: Hematology and Oncology

## 2019-05-08 ENCOUNTER — Other Ambulatory Visit: Payer: Self-pay

## 2019-05-08 ENCOUNTER — Inpatient Hospital Stay: Payer: Medicare Other

## 2019-05-08 VITALS — BP 130/84 | HR 100 | Temp 98.3°F

## 2019-05-08 DIAGNOSIS — D6481 Anemia due to antineoplastic chemotherapy: Secondary | ICD-10-CM | POA: Diagnosis not present

## 2019-05-08 DIAGNOSIS — T451X5A Adverse effect of antineoplastic and immunosuppressive drugs, initial encounter: Secondary | ICD-10-CM

## 2019-05-08 DIAGNOSIS — C787 Secondary malignant neoplasm of liver and intrahepatic bile duct: Secondary | ICD-10-CM | POA: Diagnosis not present

## 2019-05-08 DIAGNOSIS — C7951 Secondary malignant neoplasm of bone: Secondary | ICD-10-CM | POA: Diagnosis not present

## 2019-05-08 DIAGNOSIS — G893 Neoplasm related pain (acute) (chronic): Secondary | ICD-10-CM

## 2019-05-08 DIAGNOSIS — R5381 Other malaise: Secondary | ICD-10-CM

## 2019-05-08 DIAGNOSIS — C7801 Secondary malignant neoplasm of right lung: Secondary | ICD-10-CM

## 2019-05-08 DIAGNOSIS — C541 Malignant neoplasm of endometrium: Secondary | ICD-10-CM

## 2019-05-08 DIAGNOSIS — Z5111 Encounter for antineoplastic chemotherapy: Secondary | ICD-10-CM | POA: Diagnosis not present

## 2019-05-08 DIAGNOSIS — R64 Cachexia: Secondary | ICD-10-CM

## 2019-05-08 DIAGNOSIS — D61818 Other pancytopenia: Secondary | ICD-10-CM | POA: Diagnosis not present

## 2019-05-08 DIAGNOSIS — D509 Iron deficiency anemia, unspecified: Secondary | ICD-10-CM

## 2019-05-08 DIAGNOSIS — Z7189 Other specified counseling: Secondary | ICD-10-CM

## 2019-05-08 LAB — CMP (CANCER CENTER ONLY)
ALT: 31 U/L (ref 0–44)
AST: 60 U/L — ABNORMAL HIGH (ref 15–41)
Albumin: 2.8 g/dL — ABNORMAL LOW (ref 3.5–5.0)
Alkaline Phosphatase: 260 U/L — ABNORMAL HIGH (ref 38–126)
Anion gap: 14 (ref 5–15)
BUN: 14 mg/dL (ref 8–23)
CO2: 23 mmol/L (ref 22–32)
Calcium: 9.1 mg/dL (ref 8.9–10.3)
Chloride: 92 mmol/L — ABNORMAL LOW (ref 98–111)
Creatinine: 0.76 mg/dL (ref 0.44–1.00)
GFR, Est AFR Am: >60 mL/min (ref >60)
GFR, Estimated: >60 mL/min (ref >60)
Glucose, Bld: 183 mg/dL — ABNORMAL HIGH (ref 70–99)
Potassium: 3.9 mmol/L (ref 3.5–5.1)
Sodium: 129 mmol/L — ABNORMAL LOW (ref 135–145)
Total Bilirubin: 0.6 mg/dL (ref 0.3–1.2)
Total Protein: 6.3 g/dL — ABNORMAL LOW (ref 6.5–8.1)

## 2019-05-08 LAB — CBC WITH DIFFERENTIAL (CANCER CENTER ONLY)
Abs Immature Granulocytes: 0.11 10*3/uL — ABNORMAL HIGH (ref 0.00–0.07)
Basophils Absolute: 0 10*3/uL (ref 0.0–0.1)
Basophils Relative: 0 %
Eosinophils Absolute: 0 10*3/uL (ref 0.0–0.5)
Eosinophils Relative: 0 %
HCT: 31.9 % — ABNORMAL LOW (ref 36.0–46.0)
Hemoglobin: 10.3 g/dL — ABNORMAL LOW (ref 12.0–15.0)
Immature Granulocytes: 2 %
Lymphocytes Relative: 5 %
Lymphs Abs: 0.3 10*3/uL — ABNORMAL LOW (ref 0.7–4.0)
MCH: 24.6 pg — ABNORMAL LOW (ref 26.0–34.0)
MCHC: 32.3 g/dL (ref 30.0–36.0)
MCV: 76.1 fL — ABNORMAL LOW (ref 80.0–100.0)
Monocytes Absolute: 0.3 10*3/uL (ref 0.1–1.0)
Monocytes Relative: 6 %
Neutro Abs: 5.2 10*3/uL (ref 1.7–7.7)
Neutrophils Relative %: 87 %
Platelet Count: 440 10*3/uL — ABNORMAL HIGH (ref 150–400)
RBC: 4.19 MIL/uL (ref 3.87–5.11)
RDW: 19.7 % — ABNORMAL HIGH (ref 11.5–15.5)
WBC Count: 6 10*3/uL (ref 4.0–10.5)
nRBC: 1.7 % — ABNORMAL HIGH (ref 0.0–0.2)

## 2019-05-08 LAB — SAMPLE TO BLOOD BANK

## 2019-05-08 NOTE — Assessment & Plan Note (Signed)
She is weak but overall recovering from recent side effects of chemotherapy We will continue aggressive supportive care I will consult advanced home care service for RN visit along with physical therapy and Occupational Therapy at home.

## 2019-05-08 NOTE — Assessment & Plan Note (Signed)
Her cancer pain is stable She will continue prescribed pain medicine as needed

## 2019-05-08 NOTE — Progress Notes (Signed)
[facility] OFFICE PROGRESS NOTE  Patient Care Team: [name], MD as PCP - General (Internal Medicine) [name], MD as Consulting Physician (Gastroenterology) [name], MD as Consulting Physician (Ophthalmology) [name], [name], MD as Consulting Physician (Obstetrics and Gynecology) [name], MD as Consulting Physician (Hematology and Oncology) [name], MD as Consulting Physician (Radiation Oncology)  ASSESSMENT & PLAN:  Endometrial cancer [facility]) She is weak but overall recovering from recent side effects of chemotherapy We will continue aggressive supportive care I will consult advanced home care service for RN visit along with physical therapy and Occupational Therapy at home.  Pancytopenia, acquired ([place]) Overall, her blood counts are improving She does not need transfusion support today  Malignant cachexia ([place]) She still have poor appetite but overall improving She will continue current dose of dexamethasone  Cancer associated pain Her cancer pain is stable She will continue prescribed pain medicine as needed  Physical debility She is very weak and profoundly debilitated I recommend consulting home physical therapy and Occupational Therapy for rehab at home and she agreed   Orders Placed This Encounter  Procedures  . Ambulatory referral to Home Health    Referral Priority:   Routine    Referral Type:   Home Health Care    Referral Reason:   Specialty Services Required    Requested Specialty:   [place]    Number of Visits Requested:   1    INTERVAL HISTORY: Please see below for problem oriented charting. 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 SEE COMMENT.  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 The greatest extent of invasive carcinoma as measured on the needle core biopsy measures 0.6 cm.     Endometrial cancer ([place])  02/03/2002 Pathology Results   1. BENIGN ENDOMETRIAL POLYPS.  2. UTERINE FIBROIDS: PORTIONS OF SMOOTH MUSCLE CONSISTENT WITH BENIGN LEIOMYOMA(S). PORTIONS OF BENIGN, CYSTICALLY ATROPHIC ENDOMETRIUM WITHOUT HYPERPLASIA OR EVIDENCE OF MALIGNANCY. 3. ENDOMETRIAL CURETTAGE: BENIGN ENDOMETRIUM AND SMOOTH MUSCLE.   09/07/2016 Pathology Results   PAP smear positive for malignant cells   09/07/2016 Initial Diagnosis   Patient had postmenopausal bleeding in 08-2016. She had PAP 09-07-16 by PCP Dr [name], which documented carcinoma. She had evaluation by Dr [name] with colposcopy/ ECC/endometrial biopsy documenting serous endometrioid endometrial carcinoma.   09/15/2016 Imaging   Ct imaging showed markedly thickened and heterogeneous endometrial stripe suspicious for endometrial carcinoma in this patient with postmenopausal vaginal bleeding. Cystic lesion in the right ovary cannot be definitively characterized. Pelvic ultrasound is recommended for further evaluation. Aortoiliac atherosclerosis. Avascular necrosis of the femoral heads bilaterally.   10/07/2016 Tumor Marker   Patient's tumor was tested for the following markers: CA125 Results  of the tumor marker test revealed  15.1   10/13/2016 Pathology Results   1. Lymph node, sentinel, biopsy, right obturator - THERE IS NO EVIDENCE OF CARCINOMA IN 8 OF 8 LYMPH NODES (0/8). - SEE COMMENT. 2. Lymph node, sentinel, biopsy, left obturator - THERE IS NO EVIDENCE OF CARCINOMA IN 1 OF 1 LYMPH NODE (0/1). - SEE COMMENT. 3. Lymph node, sentinel, biopsy, left para-aortic - THERE IS NO EVIDENCE OF CARCINOMA IN 4 OF 4 LYMPH NODES (0/4). - SEE COMMENT. 4. Uterus +/- tubes/ovaries, neoplastic, cervix - INVASIVE MIXED SEROUS/ENDOMETRIOID ADENOCARCINOMA, MULTIPLE FOCI, FIGO GRADE III, THE LARGEST FOCUS SPANS 8.2 CM. - ADENOCARCINOMA EXTENDS INTO THE OUTER HALF OF THE MYOMETRIUM AND INVOLVES THE STROMA OF THE LOWER UTERINE SEGMENT AND CERVIX. - LYMPHOVASCULAR INVASION IS IDENTIFIED. - THE SURGICAL RESECTION MARGINS ARE NEGATIVE FOR CARCINOMA. - SEE ONCOLOGY TABLE BELOW. ADDITIONAL FINDINGS: - ENDOMETRIUM: ENDOMETRIOID TYPE POLYP(S), WHICH CONTAIN FOCI OF SIMILAR APPEARING MIXED SEROUS/ENDOMETRIOID ADENOCARCINOMA. - MYOMETRIUM: LEIOMYOMATA. - SEROSA: UNREMARKABLE. - BILATERAL ADNEXA: BENIGN OVARIES AND FALLOPIAN TUBES.   10/13/2016 Surgery   Surgery: Total robotic hysterectomy bilateral salpingo-oophorectomy, bilateral pelvic sentinel lymph node removal, left PA sentinel lymph node removal. Mini-laparotomy for specimen removal Surgeons:  [name], MD; [name], MD  Pathology:  1)Uterus, cervix, bilateral tubes and ovaries 2) Right obturator SLN 3) Left Obturator SLN 4) Left PA SLN Operative findings: 14 week fibroid uterus with one large dominant 6 cm myoma that was palpably calcified. 4 cm right ovarian cyst. + SLN identified in the bilateral obturator spaces, + SLN identified in the left PA space.    12/01/2016 - 05/31/2017 Chemotherapy   She received carboplatin & Taxol. She had 3 cycles of chemo followed by radiation and then 3 more cycles of chemo   01/11/2017 Genetic Testing   Testing was  normal and did not reveal a mutation in these genes: The genes tested were the 80 genes on [employer]'s Multi-Cancer panel (ALK, APC, ATM, AXIN2, BAP1, BARD1, BLM, BMPR1A, BRCA1, BRCA2, BRIP1, CASR, CDC73, CDH1, CDK4, CDKN1B, CDKN1C, CDKN2A, CEBPA, CHEK2, DICER1, DIS3L2, EGFR, EPCAM, FH, FLCN, GATA2, GPC3, GREM1, HOXB13,  HRAS, KIT, MAX, MEN1, MET, MITF, MLH1, MSH2, MSH6, MUTYH, NBN, NF1, NF2, PALB2, PDGFRA, PHOX2B, PMS2, POLD1, POLE, POT1, PRKAR1A, PTCH1, PTEN, RAD50, RAD51C, RAD51D, RB1, RECQL4, RET, RUNX1, SDHA, SDHAF2, SDHB, SDHC, SDHD, SMAD4, SMARCA4, SMARCB1, SMARCE1, STK11, SUFU, TERC, TERT, TMEM127, TP53, TSC1, TSC2, VHL, WRN, and WT1).   02/10/2017 - 04/12/2017 Radiation Therapy   02/10/17-03/16/17; 03/29/17-04/12/17;  1) Pelvis/ 45 Gy in 25 fractions  2) Vaginal Cuff/ 18 Gy in 3 fractions   08/26/2018 Imaging   Large destructive mass lesion left iliac bone with large soft tissue mass extending into the posterior soft tissues and pelvis compatible with metastatic disease. Consider follow-up CT chest abdomen pelvis with contrast for further staging.  Radiation changes at L5 and the sacrum. No lumbar metastatic deposits  Multilevel degenerative changes above.   08/29/2018 Imaging   1. 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 2. New irregular nodular opacity in the medial right middle lobe, new since 2017 CT. Separate subcentimeter right upper lobe solid pulmonary nodule, for which no comparison exists. These findings are indeterminate for pulmonary metastases and attention on follow-up chest CT in 3 months is recommended. 3. No lymphadenopathy or additional findings of metastatic disease.No tumor recurrence at the hysterectomy margin. 4. Aortic Atherosclerosis (ICD10-I70.0). Additional chronic findings as detailed.   08/29/2018 Tumor Marker  Patient's tumor was tested for the following markers: CA-125 Results of the tumor marker test revealed 17.4    09/06/2018 Procedure   CT-guided core biopsy performed of huge mass in the left iliac fossa destroying the left iliac bone.   09/06/2018 Pathology Results   Soft Tissue Needle Core Biopsy, Left Iliac Fossa - POORLY DIFFERENTIATED MALIGNANT NEOPLASM. Microscopic Comment The provided clinical history of both endometrial and breast cancer is noted. In the current case, immunohistochemistry for qualitative ER demonstrates scattered positive staining. CK8/18 is focally positive. CKAE1/3, CK7, CK20, TTF-1, CDX-2, GATA3 and PAX 8 are negative. The immunophenotype is not specific as to an origin for this poorly differentiated malignant neoplasm. The morphology is more suggestive of endometrial cancer than breast cancer, and may represent an unrecognized carcinosarcomatous component.   09/08/2018 - 09/27/2018 Radiation Therapy   She had palliative radiation treatment   09/29/2018 Procedure   Placement of single lumen port a cath via right internal jugular vein. The catheter tip lies at the cavo-atrial junction. A power injectable port a cath was placed and is ready for immediate use.   10/04/2018 - 01/17/2019 Chemotherapy   The patient had carboplatin and taxol x 6 cycles   12/05/2018 Tumor Marker   Patient's tumor was tested for the following markers: CA125 Results of the tumor marker test revealed 5.5   12/05/2018 Imaging   1. Considerable cystic degeneration in the large destructive mass of the left iliac bone. New or progressive left iliac bone pathologic fracture extending into the left SI joint, along with abnormal subluxation at the pubic symphysis indicating pubic symphysis instability. 2. Previous pleural-based nodularity in the right middle lobe is markedly improved, currently 4 mm in thickness and previously 11 mm in thickness. 3. Indistinct new wedge-shaped lesion in the left mid kidney, hypodense on portal venous phase images and hyperdense on delayed phase images, suggesting prolong  tubular transit. This is a nonspecific finding but could reflect local inflammation, infection, or less likely infiltrative tumor. 4. Other imaging findings of potential clinical significance: Aortic Atherosclerosis (ICD10-I70.0). Left main coronary artery atherosclerosis. 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   02/15/2019 Imaging   1. Slight progression of large expansile lytic lesion involving the left iliac bone. This demonstrates central low density, suggesting partial necrosis. This lesion was biopsied on 09/06/2018, revealing poorly differentiated malignant neoplasm. 2. New low-density lesion in the dome of the right hepatic lobe worrisome for cystic or treated metastasis. 3. No other evidence of metastatic disease. No explanation for the patient's symptoms. 4. 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    03/03/2019 - 04/07/2019 Anti-estrogen oral therapy   She cannot tolerate tamoxifen and megace   03/23/2019 Tumor Marker   Patient's tumor was tested for the following markers: CA125 Results of the tumor marker test revealed 3.8   04/13/2019 Imaging   1. 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 2. Mild decrease in size of large lytic bone metastasis and associated soft tissue mass involving the left ilium. 3. Unstable pathologic fracture of the left ilium, without significant change since prior exam. 4. New right lower lobe pulmonary metastases.     04/24/2019 -  Chemotherapy   The patient had DOCEtaxel (TAXOTERE) 130 mg in sodium chloride 0.9 % 250 mL chemo infusion, 75 mg/m2 = 130 mg, Intravenous,  Once, 1 of 4 cycles Administration: 130 mg (04/24/2019)  for chemotherapy treatment.    05/01/2019 - 05/03/2019 Hospital Admission   She was admitted to the hospital for evaluation of fever and weakness   Metastasis to bone  ([place])  04/24/2019 -  Chemotherapy   The patient had DOCEtaxel (TAXOTERE) 130 mg in sodium chloride 0.9 % 250 mL chemo infusion, 75 mg/m2 = 130 mg, Intravenous,  Once, 1 of 4 cycles Administration: 130 mg (04/24/2019)  for chemotherapy treatment.    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([place])  02/16/2019 Initial Diagnosis   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([place])   04/24/2019 -  Chemotherapy   The patient had DOCEtaxel (TAXOTERE) 130 mg in sodium chloride 0.9 % 250 mL chemo infusion, 75 mg/m2 = 130 mg, Intravenous,  Once, 1 of 4 cycles Administration: 130 mg (04/24/2019)  for chemotherapy treatment.      REVIEW OF SYSTEMS:   Constitutional: Denies fevers, chills or abnormal weight loss Eyes: Denies blurriness of vision Ears, nose, mouth, throat, and face: Denies mucositis or sore throat Respiratory: Denies cough, dyspnea or wheezes Cardiovascular: Denies palpitation, chest discomfort or lower extremity swelling Gastrointestinal:  Denies nausea, heartburn or change in bowel habits Skin: Denies abnormal skin rashes Lymphatics: Denies new lymphadenopathy or easy bruising Behavioral/Psych: Mood is stable, no new changes  All other systems were reviewed with the patient and are negative.  I have reviewed the past medical history, past surgical history, social history and family history with the patient and they are unchanged from previous note.  ALLERGIES:  is allergic to omnipaque [iohexol]; calan [verapamil]; effexor [venlafaxine]; erythromycin; femara [letrozole]; fosamax [alendronate]; ibuprofen; paxil [paroxetine hcl]; remeron [mirtazapine]; and vasotec [enalapril].  MEDICATIONS:  Current Outpatient Medications  Medication Sig Dispense Refill  . acetaminophen (TYLENOL) 500 MG tablet Take 500 mg by mouth daily as needed for moderate pain or headache.    Marland Kitchen aspirin 81 MG tablet Take 81 mg by mouth daily.      . Calcium Carbonate-Vitamin D (CALCIUM 600 + D PO) Take 1 tablet by mouth 2 (two) times daily.     .  Cholecalciferol (VITAMIN D) 2000 units CAPS Take 2,000 Units by mouth every other day.    Marland Kitchen dexamethasone (DECADRON) 4 MG tablet Take 1 tablet (4 mg total) by mouth daily. 60 tablet 0  . furosemide (LASIX) 20 MG tablet Once daily for 3 days, repeat as needed for lower extremity edema (Patient taking differently: Take 20 mg by mouth daily as needed for fluid or edema. ) 30 tablet 1  . levothyroxine (SYNTHROID, LEVOTHROID) 88 MCG tablet Take 1 tablet (88 mcg total) by mouth daily before breakfast. (Patient taking differently: Take 44-88 mcg by mouth as directed. Take 1/2 tablet (44 mcg) on MWF and Take 1 tablet (88 mcg) all other days) 90 tablet 1  . loratadine-pseudoephedrine (CLARITIN-D 24 HOUR) 10-240 MG 24 hr tablet Take 1 tablet by mouth daily as needed for allergies.    . metFORMIN (GLUCOPHAGE-XR) 500 MG 24 hr tablet Take 1 tablet (500 mg total) by mouth 2 (two) times daily. (Patient taking differently: Take 500 mg by mouth 3 (three) times daily. ) 360 tablet 1  . Multiple Vitamin (MULTIVITAMIN WITH MINERALS) TABS tablet Take 1 tablet by mouth daily.    . ondansetron (ZOFRAN) 8 MG tablet Take 1 tablet (8 mg total) by mouth every 8 (eight) hours as needed for refractory nausea / vomiting. Start on day 3 after chemo. (Patient not taking: Reported on 03/21/2019) 30 tablet 1  . pantoprazole (PROTONIX) 40 MG tablet Take 1  tablet (40 mg total) by mouth 2 (two) times daily for 30 days. 60 tablet 0  . pravastatin (PRAVACHOL) 40 MG tablet Take 1 tablet (40 mg total) by mouth every evening. 90 tablet 1  . prochlorperazine (COMPAZINE) 10 MG tablet Take 1 tablet (10 mg total) by mouth every 6 (six) hours as needed (Nausea or vomiting). (Patient not taking: Reported on 03/21/2019) 30 tablet 1  . simethicone (MYLICON) 40 HF/2.9MS drops Take 0.3 mLs (20 mg total) by mouth 4 (four) times daily as needed for flatulence. 30 mL 0  . traMADol (ULTRAM) 50 MG tablet Take 1 tablet (50 mg total) by mouth 3 (three) times  daily. Take 1/2 to 1 tablet every 4 hours as needed for severe Back Pain (Patient taking differently: Take 25 mg by mouth every 4 (four) hours as needed for moderate pain or severe pain. Take 1/2 to 1 tablet every 4 hours as needed for severe Back Pain) 90 tablet 0  . triamterene-hydrochlorothiazide (MAXZIDE) 75-50 MG tablet Take 0.5 tablets by mouth daily.      No current facility-administered medications for this visit.    Facility-Administered Medications Ordered in Other Visits  Medication Dose Route Frequency Provider Last Rate Last Dose  . sodium chloride flush (NS) 0.9 % injection 10 mL  10 mL Intracatheter PRN [name], MD   10 mL at 12/27/18 1825    PHYSICAL EXAMINATION: ECOG PERFORMANCE STATUS: 2 - Symptomatic, <50% confined to bed  Vitals:   05/08/19 1035  BP: 130/84  Pulse: 100  Temp: 98.3 F (36.8 C)  SpO2: 100%   Filed Weights    GENERAL:alert, no distress and comfortable SKIN: skin color, texture, turgor are normal, no rashes or significant lesions EYES: normal, Conjunctiva are pink and non-injected, sclera clear OROPHARYNX:no exudate, no erythema and lips, buccal mucosa, and tongue normal  NECK: supple, thyroid normal size, non-tender, without nodularity LYMPH:  no palpable lymphadenopathy in the cervical, axillary or inguinal LUNGS: clear to auscultation and percussion with normal breathing effort HEART: regular rate & rhythm and no murmurs and no lower extremity edema ABDOMEN:abdomen soft, non-tender and normal bowel sounds Musculoskeletal:no cyanosis of digits and no clubbing  NEURO: alert & oriented x 3 with fluent speech, no focal motor/sensory deficits  LABORATORY DATA:  I have reviewed the data as listed    Component Value Date/Time   NA 129 (L) 05/08/2019 1001   NA 137 05/31/2017 0931   K 3.9 05/08/2019 1001   K 3.5 05/31/2017 0931   CL 92 (L) 05/08/2019 1001   CL 99 12/20/2012 1336   CO2 23 05/08/2019 1001   CO2 25 05/31/2017 0931   GLUCOSE  183 (H) 05/08/2019 1001   GLUCOSE 161 (H) 05/31/2017 0931   GLUCOSE 110 (H) 12/20/2012 1336   BUN 14 05/08/2019 1001   BUN 10.9 05/31/2017 0931   CREATININE 0.76 05/08/2019 1001   CREATININE 0.63 07/05/2018 1009   CREATININE 0.8 05/31/2017 0931   CALCIUM 9.1 05/08/2019 1001   CALCIUM 10.6 (H) 05/31/2017 0931   PROT 6.3 (L) 05/08/2019 1001   PROT 7.5 05/31/2017 0931   ALBUMIN 2.8 (L) 05/08/2019 1001   ALBUMIN 4.2 05/31/2017 0931   AST 60 (H) 05/08/2019 1001   AST 16 05/31/2017 0931   ALT 31 05/08/2019 1001   ALT 15 05/31/2017 0931   ALKPHOS 260 (H) 05/08/2019 1001   ALKPHOS 91 05/31/2017 0931   BILITOT 0.6 05/08/2019 1001   BILITOT 0.33 05/31/2017 0931   GFRNONAA >60 05/08/2019  1001   GFRNONAA 87 07/05/2018 1009   GFRAA >60 05/08/2019 1001   GFRAA 100 07/05/2018 1009    No results found for: SPEP, UPEP  Lab Results  Component Value Date   WBC 6.0 05/08/2019   NEUTROABS 5.2 05/08/2019   HGB 10.3 (L) 05/08/2019   HCT 31.9 (L) 05/08/2019   MCV 76.1 (L) 05/08/2019   PLT 440 (H) 05/08/2019      Chemistry      Component Value Date/Time   NA 129 (L) 05/08/2019 1001   NA 137 05/31/2017 0931   K 3.9 05/08/2019 1001   K 3.5 05/31/2017 0931   CL 92 (L) 05/08/2019 1001   CL 99 12/20/2012 1336   CO2 23 05/08/2019 1001   CO2 25 05/31/2017 0931   BUN 14 05/08/2019 1001   BUN 10.9 05/31/2017 0931   CREATININE 0.76 05/08/2019 1001   CREATININE 0.63 07/05/2018 1009   CREATININE 0.8 05/31/2017 0931      Component Value Date/Time   CALCIUM 9.1 05/08/2019 1001   CALCIUM 10.6 (H) 05/31/2017 0931   ALKPHOS 260 (H) 05/08/2019 1001   ALKPHOS 91 05/31/2017 0931   AST 60 (H) 05/08/2019 1001   AST 16 05/31/2017 0931   ALT 31 05/08/2019 1001   ALT 15 05/31/2017 0931   BILITOT 0.6 05/08/2019 1001   BILITOT 0.33 05/31/2017 0931       RADIOGRAPHIC STUDIES: I have personally reviewed the radiological images as listed and agreed with the findings in the report. Ct Head Wo  Contrast  Result Date: 05/01/2019 CLINICAL DATA:  Posttraumatic headache after fall today. EXAM: CT HEAD WITHOUT CONTRAST CT CERVICAL SPINE WITHOUT CONTRAST TECHNIQUE: Multidetector CT imaging of the head and cervical spine was performed following the standard protocol without intravenous contrast. Multiplanar CT image reconstructions of the cervical spine were also generated. COMPARISON:  None. FINDINGS: CT HEAD FINDINGS Brain: Mild diffuse cortical atrophy is noted. Mild chronic ischemic white matter disease is noted. No mass effect or midline shift is noted. Ventricular size is within normal limits. There is no evidence of mass lesion, hemorrhage or acute infarction. Vascular: No hyperdense vessel or unexpected calcification. Skull: Normal. Negative for fracture or focal lesion. Sinuses/Orbits: No acute finding. Other: None. CT CERVICAL SPINE FINDINGS Alignment: Minimal grade 1 anterolisthesis of C4-5 is noted secondary to posterior facet joint hypertrophy. Skull base and vertebrae: No acute fracture. No primary bone lesion or focal pathologic process. Soft tissues and spinal canal: No prevertebral fluid or swelling. No visible canal hematoma. Disc levels: Mild degenerative disc disease is noted at C5-6 with anterior posterior osteophyte formation. There appears to be fusion of the C6-7 disc space most likely due to degenerative change. Upper chest: Negative. Other: Degenerative changes are seen involving posterior facet joints bilaterally. IMPRESSION: Mild diffuse cortical atrophy. Mild chronic ischemic white matter disease. No acute intracranial abnormality seen. Multilevel degenerative disc disease. No acute abnormality seen in the cervical spine. Electronically Signed   By: Marijo Conception M.D.   On: 05/01/2019 15:17   Ct Cervical Spine Wo Contrast  Result Date: 05/01/2019 CLINICAL DATA:  Posttraumatic headache after fall today. EXAM: CT HEAD WITHOUT CONTRAST CT CERVICAL SPINE WITHOUT CONTRAST TECHNIQUE:  Multidetector CT imaging of the head and cervical spine was performed following the standard protocol without intravenous contrast. Multiplanar CT image reconstructions of the cervical spine were also generated. COMPARISON:  None. FINDINGS: CT HEAD FINDINGS Brain: Mild diffuse cortical atrophy is noted. Mild chronic ischemic white matter disease is  noted. No mass effect or midline shift is noted. Ventricular size is within normal limits. There is no evidence of mass lesion, hemorrhage or acute infarction. Vascular: No hyperdense vessel or unexpected calcification. Skull: Normal. Negative for fracture or focal lesion. Sinuses/Orbits: No acute finding. Other: None. CT CERVICAL SPINE FINDINGS Alignment: Minimal grade 1 anterolisthesis of C4-5 is noted secondary to posterior facet joint hypertrophy. Skull base and vertebrae: No acute fracture. No primary bone lesion or focal pathologic process. Soft tissues and spinal canal: No prevertebral fluid or swelling. No visible canal hematoma. Disc levels: Mild degenerative disc disease is noted at C5-6 with anterior posterior osteophyte formation. There appears to be fusion of the C6-7 disc space most likely due to degenerative change. Upper chest: Negative. Other: Degenerative changes are seen involving posterior facet joints bilaterally. IMPRESSION: Mild diffuse cortical atrophy. Mild chronic ischemic white matter disease. No acute intracranial abnormality seen. Multilevel degenerative disc disease. No acute abnormality seen in the cervical spine. Electronically Signed   By: Marijo Conception M.D.   On: 05/01/2019 15:17   Ct Abdomen Pelvis W Contrast  Result Date: 04/13/2019 CLINICAL DATA:  Follow-up metastatic endometrial carcinoma. Previous surgery, chemotherapy, and radiation therapy. EXAM: CT ABDOMEN AND PELVIS WITH CONTRAST TECHNIQUE: Multidetector CT imaging of the abdomen and pelvis was performed using the standard protocol following bolus administration of  intravenous contrast. CONTRAST:  178m OMNIPAQUE IOHEXOL 300 MG/ML  SOLN COMPARISON:  02/15/2019 FINDINGS: Lower Chest: 2 new pulmonary nodule seen in the right lower lobe, largest measuring 1.7 cm, consistent with pulmonary metastases. Hepatobiliary: Marked increase in size of heterogeneously enhancing mass in right hepatic lobe, currently measuring 12.9 x 9.8 cm, compared to 1.8 x 1.3 cm previously. No other liver masses identified. Gallbladder is unremarkable. No evidence of biliary ductal dilatation. Pancreas:  No mass or inflammatory changes. Spleen: Within normal limits in size and appearance. Adrenals/Urinary Tract: No masses identified. A few tiny renal cysts are again seen bilaterally. No evidence of hydronephrosis. Stomach/Bowel: No evidence of obstruction, inflammatory process or abnormal fluid collections. Normal appendix visualized. Vascular/Lymphatic: No pathologically enlarged lymph nodes. No abdominal aortic aneurysm. Aortic atherosclerosis. Reproductive: Prior hysterectomy noted. Adnexal regions are unremarkable in appearance. Other:  None. 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 Associated soft tissue mass is mildly decreased in size, currently measuring 7.5 x 6.1 cm on image 55/2, compared to 8.1 x 7.3 cm previously. No other bone metastases identified. IMPRESSION: 1. 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 2. Mild decrease in size of large lytic bone metastasis and associated soft tissue mass involving the left ilium. 3. Unstable pathologic fracture of the left ilium, without significant change since prior exam. 4. New right lower lobe pulmonary metastases. Electronically Signed   By: JEarle GellM.D.   On: 04/13/2019 12:02    All questions were answered. The patient knows to call the clinic with any problems,  questions or concerns. No barriers to learning was detected.  I spent 30 minutes counseling the patient face to face. The total time spent in the appointment was 40 minutes and more than 50% was on counseling and review of test results  NHeath Lark MD 05/08/2019 12:12 PM

## 2019-05-08 NOTE — Assessment & Plan Note (Signed)
She still have poor appetite but overall improving She will continue current dose of dexamethasone

## 2019-05-08 NOTE — Progress Notes (Signed)
Virtual Visit via Telephone Note  I connected with Tammy Hensley on 05/09/19 at 10:00 AM EDT by telephone and verified that I am speaking with the correct person using two identifiers.   I discussed the limitations, risks, security and privacy concerns of performing an evaluation and management service by telephone and the availability of in person appointments. I also discussed with the patient that there may be a patient responsible charge related to this service. The patient expressed understanding and agreed to proceed.   Hospital follow up  Assessment and Plan: Hospital visit follow up for increasing weakness, diarrhea and sore throat. Tammy Hensley was seen today for hospitalization follow-up.  Diagnoses and all orders for this visit:  Pancytopenia ([place])  Endometrial cancer ([place])  Neutropenia, unspecified type ([place])  Hypokalemia  Anemia of chronic disease  Malignant neoplasm of female breast, unspecified estrogen receptor status, unspecified laterality, unspecified site of breast ([place])  Essential hypertension  Hypothyroidism, unspecified type    All medications were reviewed with patient and family and fully reconciled. All questions answered fully, and patient and family members were encouraged to call the office with any further questions or concerns. Discussed goal to avoid readmission related to this diagnosis.  There are no discontinued medications.   Follow Up Instructions:  Discussed assessment and treatment plan with the patient. The patient was provided an opportunity to ask questions and all were answered. The patient agrees with the plan of care and demonstrates an understanding of the instructions.   The patient was advised to call back or seek an in-person evaluation if the symptoms worsen or if the condition fails to improve as anticipated.  I provided 30 minutes of non-face-to-face time during this encounter including interview, counseling, chart review, and  critical decision making was preformed.   Future Appointments  Date Time Provider [place]  05/15/2019  9:30 AM [facility] LAB 4 [facility] None  05/15/2019  9:45 AM [facility] [place] FLUSH [facility] None  05/15/2019 10:15 AM Alvy Bimler, Ni, MD [facility] None  05/15/2019 11:30 AM [facility] INFUSION [facility] None  06/05/2019  9:15 AM [facility] LAB 2 [facility] None  06/05/2019  9:30 AM [facility] [place] FLUSH [facility] None  06/05/2019 10:00 AM Gorsuch, Ni, MD [facility] None  06/05/2019 11:30 AM [facility] INFUSION [facility] None  06/26/2019 10:15 AM [facility] LAB 6 [facility] None  06/26/2019 10:30 AM [facility] [place] FLUSH [facility] None  06/26/2019 11:00 AM Gorsuch, Ni, MD [facility] None  06/26/2019 11:30 AM [facility] INFUSION [facility] None  07/24/2019  9:00 AM [name], MD [facility] None  04/09/2020  2:00 PM Garnet Sierras, NP [facility] None     HPI 78 y.o.female presents for follow up for transition from recent hospitalization. Admit date to the hospital was 05/01/19, patient was discharged from the hospital on 05/03/19 and our clinical staff contacted the office the day after discharge to set up a follow up appointment. The discharge summary, medications, and diagnostic test results were reviewed before meeting with the patient. The patient was admitted for:  Diarrhea, worsening generalized weakness, throat pain, decreased po intake. Her voice on the phone is weak but reports she has had some improvement.  She remains excessively fatigued and struggles with some self care tasks.  Diarrhea has resolved, she is battling constipation.  She has been drinking at least 8-10 bottles of water a day. She also uses Miralax to help with this. She has been trying to increase her food intake, she is eating spinach, liver, potatoes and reports her appetite is back.  Denies any nausea, vomiting, or cramping.  She reports her mouth sores have improved.  Reports at hospital discharge she was  given magic mouth wash that helped.  She is requesting Rx to have on hand if the flare up again.  She was seen yesterday for follow up by Oncologist and had labs at that time.  Overall improvement since discharge. WBC 04 to 6.0 Hgb 8.8 to 10.3 Hypokalemia resolved prior to discharge & 3.9 one day ago. Her next appointment with Oncology is in one week.   Home health is involved.  She is awaiting initial contact to set up evaluation for needs, safety in home and improve strength.  Images while in the hospital: Ct Head Wo Contrast  Result Date: 05/01/2019 CLINICAL DATA:  Posttraumatic headache after fall today. EXAM: CT HEAD WITHOUT CONTRAST CT CERVICAL SPINE WITHOUT CONTRAST TECHNIQUE: Multidetector CT imaging of the head and cervical spine was performed following the standard protocol without intravenous contrast. Multiplanar CT image reconstructions of the cervical spine were also generated. COMPARISON:  None. FINDINGS: CT HEAD FINDINGS Brain: Mild diffuse cortical atrophy is noted. Mild chronic ischemic white matter disease is noted. No mass effect or midline shift is noted. Ventricular size is within normal limits. There is no evidence of mass lesion, hemorrhage or acute infarction. Vascular: No hyperdense vessel or unexpected calcification. Skull: Normal. Negative for fracture or focal lesion. Sinuses/Orbits: No acute finding. Other: None. CT CERVICAL SPINE FINDINGS Alignment: Minimal grade 1 anterolisthesis of C4-5 is noted secondary to posterior facet joint hypertrophy. Skull base and vertebrae: No acute fracture. No primary bone lesion or focal pathologic process. Soft tissues and spinal canal: No prevertebral fluid or swelling. No visible canal hematoma. Disc levels: Mild degenerative disc disease is noted at C5-6 with anterior posterior osteophyte formation. There appears to be fusion of the C6-7 disc space most likely due to degenerative change. Upper chest: Negative. Other: Degenerative changes  are seen involving posterior facet joints bilaterally. IMPRESSION: Mild diffuse cortical atrophy. Mild chronic ischemic white matter disease. No acute intracranial abnormality seen. Multilevel degenerative disc disease. No acute abnormality seen in the cervical spine. Electronically Signed   By: Marijo Conception M.D.   On: 05/01/2019 15:17   Ct Cervical Spine Wo Contrast  Result Date: 05/01/2019 CLINICAL DATA:  Posttraumatic headache after fall today. EXAM: CT HEAD WITHOUT CONTRAST CT CERVICAL SPINE WITHOUT CONTRAST TECHNIQUE: Multidetector CT imaging of the head and cervical spine was performed following the standard protocol without intravenous contrast. Multiplanar CT image reconstructions of the cervical spine were also generated. COMPARISON:  None. FINDINGS: CT HEAD FINDINGS Brain: Mild diffuse cortical atrophy is noted. Mild chronic ischemic white matter disease is noted. No mass effect or midline shift is noted. Ventricular size is within normal limits. There is no evidence of mass lesion, hemorrhage or acute infarction. Vascular: No hyperdense vessel or unexpected calcification. Skull: Normal. Negative for fracture or focal lesion. Sinuses/Orbits: No acute finding. Other: None. CT CERVICAL SPINE FINDINGS Alignment: Minimal grade 1 anterolisthesis of C4-5 is noted secondary to posterior facet joint hypertrophy. Skull base and vertebrae: No acute fracture. No primary bone lesion or focal pathologic process. Soft tissues and spinal canal: No prevertebral fluid or swelling. No visible canal hematoma. Disc levels: Mild degenerative disc disease is noted at C5-6 with anterior posterior osteophyte formation. There appears to be fusion of the C6-7 disc space most likely due to degenerative change. Upper chest: Negative. Other: Degenerative changes are seen involving posterior facet joints bilaterally. IMPRESSION:  Mild diffuse cortical atrophy. Mild chronic ischemic white matter disease. No acute intracranial  abnormality seen. Multilevel degenerative disc disease. No acute abnormality seen in the cervical spine. Electronically Signed   By: Marijo Conception M.D.   On: 05/01/2019 15:17     Current Outpatient Medications (Endocrine & Metabolic):  .  dexamethasone (DECADRON) 4 MG tablet, Take 1 tablet (4 mg total) by mouth daily. Marland Kitchen  levothyroxine (SYNTHROID, LEVOTHROID) 88 MCG tablet, Take 1 tablet (88 mcg total) by mouth daily before breakfast. (Patient taking differently: Take 44-88 mcg by mouth as directed. Take 1/2 tablet (44 mcg) on MWF and Take 1 tablet (88 mcg) all other days) .  metFORMIN (GLUCOPHAGE-XR) 500 MG 24 hr tablet, Take 1 tablet (500 mg total) by mouth 2 (two) times daily. (Patient taking differently: Take 500 mg by mouth 2 (two) times a day. )   Current Outpatient Medications (Cardiovascular):  .  furosemide (LASIX) 20 MG tablet, Once daily for 3 days, repeat as needed for lower extremity edema (Patient taking differently: Take 20 mg by mouth daily as needed for fluid or edema. ) .  pravastatin (PRAVACHOL) 40 MG tablet, Take 1 tablet (40 mg total) by mouth every evening. .  triamterene-hydrochlorothiazide (MAXZIDE) 75-50 MG tablet, Take 0.5 tablets by mouth daily.    Current Outpatient Medications (Respiratory):  .  loratadine-pseudoephedrine (CLARITIN-D 24 HOUR) 10-240 MG 24 hr tablet, Take 1 tablet by mouth daily as needed for allergies.   Current Outpatient Medications (Analgesics):  .  acetaminophen (TYLENOL) 500 MG tablet, Take 500 mg by mouth daily as needed for moderate pain or headache. Marland Kitchen  aspirin 81 MG tablet, Take 81 mg by mouth daily.   .  traMADol (ULTRAM) 50 MG tablet, Take 1 tablet (50 mg total) by mouth 3 (three) times daily. Take 1/2 to 1 tablet every 4 hours as needed for severe Back Pain (Patient taking differently: Take 25 mg by mouth every 4 (four) hours as needed for moderate pain or severe pain. Take 1/2 to 1 tablet every 4 hours as needed for severe Back  Pain)     Current Outpatient Medications (Other):  Marland Kitchen  Calcium Carbonate-Vitamin D (CALCIUM 600 + D PO), Take 1 tablet by mouth 2 (two) times daily.  .  Cholecalciferol (VITAMIN D) 2000 units CAPS, Take 2,000 Units by mouth every other day. .  Multiple Vitamin (MULTIVITAMIN WITH MINERALS) TABS tablet, Take 1 tablet by mouth daily. .  ondansetron (ZOFRAN) 8 MG tablet, Take 1 tablet (8 mg total) by mouth every 8 (eight) hours as needed for refractory nausea / vomiting. Start on day 3 after chemo. .  pantoprazole (PROTONIX) 40 MG tablet, Take 1 tablet (40 mg total) by mouth 2 (two) times daily for 30 days. .  prochlorperazine (COMPAZINE) 10 MG tablet, Take 1 tablet (10 mg total) by mouth every 6 (six) hours as needed (Nausea or vomiting). .  simethicone (MYLICON) 40 OA/4.1YS drops, Take 0.3 mLs (20 mg total) by mouth 4 (four) times daily as needed for flatulence. (Patient not taking: Reported on 05/09/2019)   Facility-Administered Medications Ordered in Other Visits (Other):  .  sodium chloride flush (NS) 0.9 % injection 10 mL No current facility-administered medications for this visit.   Past Medical History:  Diagnosis Date  . 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 Breast cancer ([place]) 2006   left breast  . Depression   . Diabetes ([place])    borderline  . Endometrial ca (  [place]) 2018  . Family history of cancer   . History of brachytherapy 03/29/17-04/12/17   vaginal cuff 18 Gy in 3 fractions  . History of radiation therapy 02/10/17-03/16/17   pelvis 45 Gy in 25 fractions  . Hyperlipidemia   . Hypertension   . Iron deficiency anemia   . 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 Vitamin D deficiency      Allergies  Allergen Reactions  . Omnipaque [Iohexol] Hives    Pt will need 13 hr prep in the future--SLG  . Calan [Verapamil]     Constipation   . Effexor [Venlafaxine]     unknown  . Erythromycin Nausea And Vomiting  . Femara [Letrozole]  Swelling  . Fosamax [Alendronate]     aching  . Ibuprofen     Dizziness, incoherent   . Paxil [Paroxetine Hcl] Nausea Only  . Remeron [Mirtazapine]     Weight gain   . Vasotec [Enalapril] Cough    ROS: all negative except above.   Physical Exam: There were no vitals filed for this visit. There were no vitals taken for this visit.   General : Well sounding patient in no apparent distress HEENT: no hoarseness, no cough for duration of visit Lungs: speaks in complete sentences, no audible wheezing, no apparent distress Neurological: alert, oriented x 3 Psychiatric: pleasant, judgement appropriate   Garnet Sierras, NP 10:10 AM [facility]

## 2019-05-08 NOTE — Assessment & Plan Note (Signed)
Overall, her blood counts are improving She does not need transfusion support today

## 2019-05-08 NOTE — Progress Notes (Signed)
Order placed for [place]

## 2019-05-08 NOTE — Assessment & Plan Note (Signed)
She is very weak and profoundly debilitated I recommend consulting home physical therapy and Occupational Therapy for rehab at home and she agreed

## 2019-05-08 NOTE — Telephone Encounter (Signed)
I spoke with her son this morning The patient is very weak at home I recommend appointment today to check on her blood work and transfuse if her weakness is due to anemia We also discussed referral for advanced home care and he agreed

## 2019-05-09 ENCOUNTER — Ambulatory Visit: Payer: Medicare Other | Admitting: Adult Health Nurse Practitioner

## 2019-05-09 ENCOUNTER — Encounter: Payer: Self-pay | Admitting: Adult Health Nurse Practitioner

## 2019-05-09 DIAGNOSIS — I10 Essential (primary) hypertension: Secondary | ICD-10-CM

## 2019-05-09 DIAGNOSIS — C541 Malignant neoplasm of endometrium: Secondary | ICD-10-CM

## 2019-05-09 DIAGNOSIS — E876 Hypokalemia: Secondary | ICD-10-CM

## 2019-05-09 DIAGNOSIS — D638 Anemia in other chronic diseases classified elsewhere: Secondary | ICD-10-CM

## 2019-05-09 DIAGNOSIS — C50919 Malignant neoplasm of unspecified site of unspecified female breast: Secondary | ICD-10-CM

## 2019-05-09 DIAGNOSIS — D61818 Other pancytopenia: Secondary | ICD-10-CM | POA: Diagnosis not present

## 2019-05-09 DIAGNOSIS — D709 Neutropenia, unspecified: Secondary | ICD-10-CM | POA: Diagnosis not present

## 2019-05-09 DIAGNOSIS — E039 Hypothyroidism, unspecified: Secondary | ICD-10-CM

## 2019-05-09 NOTE — Patient Instructions (Signed)
Continue to increase water intake 8-10 bottles daily Continue to increase foods you eat Monitor for further diarrhea/constipation symptoms  If you are not contact by Home Health this week, please contact Oncologist or our office to help facilitate this for you.  We will contact you which pharmacy we will send in the [place].  Please call or return with any new or worsening symptoms.   - Vit D  And Vit C 1,000 mg  are recommended to help protect  against the Covid_19 and other Corona viruses.   - Also it's recommended to take Zinc 50 mg to help  protect against the Covid_19  And best place to get  is also on [employer].com and don't pay more than 6-8 cents /pill !   ================================ Coronavirus (COVID-19) Are you at risk?  Are you at risk for the Coronavirus (COVID-19)?  To be considered HIGH RISK for Coronavirus (COVID-19), you have to meet the following criteria:  . Traveled to [place], [place], [place], [place] or [place]; or in the [place] to [place], [place], [place]  . or [place]; and have fever, cough, and shortness of breath within the last 2 weeks of travel OR . Been in close contact with a person diagnosed with COVID-19 within the last 2 weeks and have  . fever, cough,and shortness of breath .  . IF YOU DO NOT MEET THESE CRITERIA, YOU ARE CONSIDERED LOW RISK FOR COVID-19.  What to do if you are HIGH RISK for COVID-19?  Marland Kitchen If you are having a medical emergency, call 911. . Seek medical care right away. Before you go to a doctor's office, urgent care or emergency department, .  call ahead and tell them about your recent travel, contact with someone diagnosed with COVID-19  .  and your symptoms.  . You should receive instructions from your physician's office regarding next steps of care.  . When you arrive at healthcare provider, tell the healthcare staff immediately you have returned from  . visiting [place], [place], [place], [place] or [place]; or traveled in the [place] to [place], [place],  . [place] or [place] in the last two weeks or you have been in close contact with a person diagnosed with  . COVID-19 in the last 2 weeks.   . Tell the health care staff about your symptoms: fever, cough and shortness of breath. . After you have been seen by a medical provider, you will be either: o Tested for (COVID-19) and discharged home on quarantine except to seek medical care if  o symptoms worsen, and asked to  - Stay home and avoid contact with others until you get your results (4-5 days)  - Avoid travel on public transportation if possible (such as bus, train, or airplane) or o Sent to the Emergency Department by EMS for evaluation, COVID-19 testing  and  o possible admission depending on your condition and test results.  What to do if you are LOW RISK for COVID-19?  Reduce your risk of any infection by using the same precautions used for avoiding the common cold or flu:  Marland Kitchen Wash your hands often with soap and warm water for at least 20 seconds.  If soap and water are not readily available,  . use an alcohol-based hand sanitizer with at least 60% alcohol.  . If coughing or sneezing, cover your mouth and nose by coughing or sneezing into the elbow areas of your shirt or coat, .  into a tissue or into your sleeve (not your hands). . Avoid shaking hands with others and consider head nods or verbal greetings only. . Avoid touching your eyes, nose, or mouth with unwashed hands.  . Avoid close contact with people who are sick. . Avoid places or events with large numbers of people in one location, like concerts or sporting events. . Carefully consider travel plans you have or are making. . If you are planning any travel outside or inside the [place], visit the CDC's Travelers' Health webpage for the latest health notices. . If you have some symptoms but not all symptoms, continue to monitor at home and seek medical  attention  . if your symptoms worsen. . If you are having a medical emergency, call 911.   . >>>>>>>>>>>>>>>>>>>>>>>>>>>>>>>>> . We Do NOT Approve of  [facility], [employer]  Our Patients  To Do Home Visits & We Do NOT Approve of [employer] > > > > > > > > > > > > > > > > > > > > > > > > > > > > > > > > > > > > > > >  Preventive Care for Adults  A healthy lifestyle and preventive care can promote health and wellness. Preventive health guidelines for women include the following key practices.  A routine yearly physical is a good way to check with your health care provider about your health and preventive screening. It is a chance to share any concerns and updates on your health and to receive a thorough exam.  Visit your dentist for a routine exam and preventive care every 6 months. Brush your teeth twice a day and floss once a day. Good oral hygiene prevents tooth decay and gum disease.  The frequency of eye exams is based on your age, health, family medical history, use of contact lenses, and other factors. Follow your health care provider's recommendations for frequency of eye exams.  Eat a healthy diet. Foods like vegetables, fruits, whole grains, low-fat dairy products, and lean protein foods contain the nutrients you need without too many calories. Decrease your intake of foods high in solid fats, added sugars, and salt. Eat the right amount of calories for you. Get information about a proper diet from your health care provider, if necessary.  Regular physical exercise is one of the most important things you can do for your health. Most adults should get at least 150 minutes of moderate-intensity exercise (any activity that increases your heart rate and causes you to sweat) each week. In addition, most adults need muscle-strengthening exercises on 2 or more days a week.  Maintain a healthy weight. The body mass index (BMI) is a screening tool to identify  possible weight problems. It provides an estimate of body fat based on height and weight. Your health care provider can find your BMI and can help you achieve or maintain a healthy weight. For adults 20 years and older:  A BMI below 18.5 is considered underweight.  A BMI of 18.5 to 24.9 is normal.  A BMI of 25 to 29.9 is considered overweight.  A BMI of 30 and above is considered obese.  Maintain normal blood lipids and cholesterol levels by exercising and minimizing your intake of saturated fat. Eat a balanced diet with plenty of fruit and vegetables. If your lipid or cholesterol levels are high, you are over 50, or you are at high risk for heart disease, you may need your cholesterol levels  checked more frequently. Ongoing high lipid and cholesterol levels should be treated with medicines if diet and exercise are not working.  If you smoke, find out from your health care provider how to quit. If you do not use tobacco, do not start.  Lung cancer screening is recommended for adults aged 17-80 years who are at high risk for developing lung cancer because of a history of smoking. A yearly low-dose CT scan of the lungs is recommended for people who have at least a 30-pack-year history of smoking and are a current smoker or have quit within the past 15 years. A pack year of smoking is smoking an average of 1 pack of cigarettes a day for 1 year (for example: 1 pack a day for 30 years or 2 packs a day for 15 years). Yearly screening should continue until the smoker has stopped smoking for at least 15 years. Yearly screening should be stopped for people who develop a health problem that would prevent them from having lung cancer treatment.  Avoid use of street drugs. Do not share needles with anyone. Ask for help if you need support or instructions about stopping the use of drugs.  High blood pressure causes heart disease and increases the risk of stroke.  Ongoing high blood pressure should be treated  with medicines if weight loss and exercise do not work.  If you are 80-17 years old, ask your health care provider if you should take aspirin to prevent strokes.  Diabetes screening involves taking a blood sample to check your fasting blood sugar level. This should be done once every 3 years, after age 12, if you are within normal weight and without risk factors for diabetes. Testing should be considered at a younger age or be carried out more frequently if you are overweight and have at least 1 risk factor for diabetes.  Breast cancer screening is essential preventive care for women. You should practice "breast self-awareness." This means understanding the normal appearance and feel of your breasts and may include breast self-examination. Any changes detected, no matter how small, should be reported to a health care provider. Women in their 107s and 30s should have a clinical breast exam (CBE) by a health care provider as part of a regular health exam every 1 to 3 years. After age 108, women should have a CBE every year. Starting at age 64, women should consider having a mammogram (breast X-ray test) every year. Women who have a family history of breast cancer should talk to their health care provider about genetic screening. Women at a high risk of breast cancer should talk to their health care providers about having an MRI and a mammogram every year.  Breast cancer gene (BRCA)-related cancer risk assessment is recommended for women who have family members with BRCA-related cancers. BRCA-related cancers include breast, ovarian, tubal, and peritoneal cancers. Having family members with these cancers may be associated with an increased risk for harmful changes (mutations) in the breast cancer genes BRCA1 and BRCA2. Results of the assessment will determine the need for genetic counseling and BRCA1 and BRCA2 testing.  Routine pelvic exams to screen for cancer are no longer recommended for nonpregnant women who  are considered low risk for cancer of the pelvic organs (ovaries, uterus, and vagina) and who do not have symptoms. Ask your health care provider if a screening pelvic exam is right for you.  If you have had past treatment for cervical cancer or a condition that could lead  to cancer, you need Pap tests and screening for cancer for at least 20 years after your treatment. If Pap tests have been discontinued, your risk factors (such as having a new sexual partner) need to be reassessed to determine if screening should be resumed. Some women have medical problems that increase the chance of getting cervical cancer. In these cases, your health care provider may recommend more frequent screening and Pap tests.    Colorectal cancer can be detected and often prevented. Most routine colorectal cancer screening begins at the age of 91 years and continues through age 53 years. However, your health care provider may recommend screening at an earlier age if you have risk factors for colon cancer. On a yearly basis, your health care provider may provide home test kits to check for hidden blood in the stool. Use of a small camera at the end of a tube, to directly examine the colon (sigmoidoscopy or colonoscopy), can detect the earliest forms of colorectal cancer. Talk to your health care provider about this at age 60, when routine screening begins.  Direct exam of the colon should be repeated every 5-10 years through age 30 years, unless early forms of pre-cancerous polyps or small growths are found.  Osteoporosis is a disease in which the bones lose minerals and strength with aging. This can result in serious bone fractures or breaks. The risk of osteoporosis can be identified using a bone density scan. Women ages 58 years and over and women at risk for fractures or osteoporosis should discuss screening with their health care providers. Ask your health care provider whether you should take a calcium supplement or vitamin  D to reduce the rate of osteoporosis.  Menopause can be associated with physical symptoms and risks. Hormone replacement therapy is available to decrease symptoms and risks. You should talk to your health care provider about whether hormone replacement therapy is right for you.  Use sunscreen. Apply sunscreen liberally and repeatedly throughout the day. You should seek shade when your shadow is shorter than you. Protect yourself by wearing long sleeves, pants, a wide-brimmed hat, and sunglasses year round, whenever you are outdoors.  Once a month, do a whole body skin exam, using a mirror to look at the skin on your back. Tell your health care provider of new moles, moles that have irregular borders, moles that are larger than a pencil eraser, or moles that have changed in shape or color.  Stay current with required vaccines (immunizations).  Influenza vaccine. All adults should be immunized every year.  Tetanus, diphtheria, and acellular pertussis (Td, Tdap) vaccine. Pregnant women should receive 1 dose of Tdap vaccine during each pregnancy. The dose should be obtained regardless of the length of time since the last dose. Immunization is preferred during the 27th-36th week of gestation. An adult who has not previously received Tdap or who does not know her vaccine status should receive 1 dose of Tdap. This initial dose should be followed by tetanus and diphtheria toxoids (Td) booster doses every 10 years. Adults with an unknown or incomplete history of completing a 3-dose immunization series with Td-containing vaccines should begin or complete a primary immunization series including a Tdap dose. Adults should receive a Td booster every 10 years.    Zoster vaccine. One dose is recommended for adults aged 70 years or older unless certain conditions are present.    Pneumococcal 13-valent conjugate (PCV13) vaccine. When indicated, a person who is uncertain of her immunization history and has  no  record of immunization should receive the PCV13 vaccine. An adult aged 53 years or older who has certain medical conditions and has not been previously immunized should receive 1 dose of PCV13 vaccine. This PCV13 should be followed with a dose of pneumococcal polysaccharide (PPSV23) vaccine. The PPSV23 vaccine dose should be obtained at least 1 or more year(s) after the dose of PCV13 vaccine. An adult aged 63 years or older who has certain medical conditions and previously received 1 or more doses of PPSV23 vaccine should receive 1 dose of PCV13. The PCV13 vaccine dose should be obtained 1 or more years after the last PPSV23 vaccine dose.    Pneumococcal polysaccharide (PPSV23) vaccine. When PCV13 is also indicated, PCV13 should be obtained first. All adults aged 53 years and older should be immunized. An adult younger than age 21 years who has certain medical conditions should be immunized. Any person who resides in a nursing home or long-term care facility should be immunized. An adult smoker should be immunized. People with an immunocompromised condition and certain other conditions should receive both PCV13 and PPSV23 vaccines. People with human immunodeficiency virus (HIV) infection should be immunized as soon as possible after diagnosis. Immunization during chemotherapy or radiation therapy should be avoided. Routine use of PPSV23 vaccine is not recommended for American Indians, [place] Natives, or people younger than 65 years unless there are medical conditions that require PPSV23 vaccine. When indicated, people who have unknown immunization and have no record of immunization should receive PPSV23 vaccine. One-time revaccination 5 years after the first dose of PPSV23 is recommended for people aged 19-64 years who have chronic kidney failure, nephrotic syndrome, asplenia, or immunocompromised conditions. People who received 1-2 doses of PPSV23 before age 88 years should receive another dose of PPSV23  vaccine at age 34 years or later if at least 5 years have passed since the previous dose. Doses of PPSV23 are not needed for people immunized with PPSV23 at or after age 66 years.   Preventive Services / Frequency  Ages 33 years and over  Blood pressure check.  Lipid and cholesterol check.  Lung cancer screening. / Every year if you are aged 78-80 years and have a 30-pack-year history of smoking and currently smoke or have quit within the past 15 years. Yearly screening is stopped once you have quit smoking for at least 15 years or develop a health problem that would prevent you from having lung cancer treatment.  Clinical breast exam.** / Every year after age 46 years.   BRCA-related cancer risk assessment.** / For women who have family members with a BRCA-related cancer (breast, ovarian, tubal, or peritoneal cancers).  Mammogram.** / Every year beginning at age 59 years and continuing for as long as you are in good health. Consult with your health care provider.  Pap test.** / Every 3 years starting at age 13 years through age 69 or 2 years with 3 consecutive normal Pap tests. Testing can be stopped between 65 and 70 years with 3 consecutive normal Pap tests and no abnormal Pap or HPV tests in the past 10 years.  Fecal occult blood test (FOBT) of stool. / Every year beginning at age 48 years and continuing until age 71 years. You may not need to do this test if you get a colonoscopy every 10 years.  Flexible sigmoidoscopy or colonoscopy.** / Every 5 years for a flexible sigmoidoscopy or every 10 years for a colonoscopy beginning at age 66 years and continuing until  age 64 years.  Hepatitis C blood test.** / For all people born from [birth year] through 1965 and any individual with known risks for hepatitis C.  Osteoporosis screening.** / A one-time screening for women ages 37 years and over and women at risk for fractures or osteoporosis.  Skin self-exam. / Monthly.  Influenza vaccine. /  Every year.  Tetanus, diphtheria, and acellular pertussis (Tdap/Td) vaccine.** / 1 dose of Td every 10 years.  Zoster vaccine.** / 1 dose for adults aged 51 years or older.  Pneumococcal 13-valent conjugate (PCV13) vaccine.** / Consult your health care provider.  Pneumococcal polysaccharide (PPSV23) vaccine.** / 1 dose for all adults aged 29 years and older. Screening for abdominal aortic aneurysm (AAA)  by ultrasound is recommended for people who have history of high blood pressure or who are current or former smokers. ++++++++++++++++++++ Recommend Adult Low Dose Aspirin or  coated  Aspirin 81 mg daily  To reduce risk of Colon Cancer 40 %,  Skin Cancer 26 % ,  Melanoma 46%  and  Pancreatic cancer 60% ++++++++++++++++++++ Vitamin D goal  is between 70-100.  Please make sure that you are taking your Vitamin D as directed.  It is very important as a natural anti-inflammatory  helping hair, skin, and nails, as well as reducing stroke and heart attack risk.  It helps your bones and helps with mood. It also decreases numerous cancer risks so please take it as directed.  Low Vit D is associated with a 200-300% higher risk for CANCER  and 200-300% higher risk for HEART   ATTACK  &  STROKE.   .....................................Marland Kitchen It is also associated with higher death rate at younger ages,  autoimmune diseases like Rheumatoid arthritis, Lupus, Multiple Sclerosis.    Also many other serious conditions, like depression, Alzheimer's Dementia, infertility, muscle aches, fatigue, fibromyalgia - just to name a few. ++++++++++++++++++ Recommend the book "The END of DIETING" by Dr Excell Seltzer  & the book "The END of DIABETES " by Dr Excell Seltzer At [email or web address] - get book & Audio CD's    Being diabetic has a  300% increased risk for heart attack, stroke, cancer, and alzheimer- type vascular dementia. It is very important that you work harder with diet by avoiding all foods that are white.  Avoid white rice (brown & wild rice is OK), white potatoes (sweetpotatoes in moderation is OK), White bread or wheat bread or anything made out of white flour like bagels, donuts, rolls, buns, biscuits, cakes, pastries, cookies, pizza crust, and pasta (made from white flour & egg whites) - vegetarian pasta or spinach or wheat pasta is OK. Multigrain breads like Arnold's or [employer], or multigrain sandwich thins or flatbreads.  Diet, exercise and weight loss can reverse and cure diabetes in the early stages.  Diet, exercise and weight loss is very important in the control and prevention of complications of diabetes which affects every system in your body, ie. Brain - dementia/stroke, eyes - glaucoma/blindness, heart - heart attack/heart failure, kidneys - dialysis, stomach - gastric paralysis, intestines - malabsorption, nerves - severe painful neuritis, circulation - gangrene & loss of a leg(s), and finally cancer and Alzheimers.    I recommend avoid fried & greasy foods,  sweets/candy, white rice (brown or wild rice or Quinoa is OK), white potatoes (sweet potatoes are OK) - anything made from white flour - bagels, doughnuts, rolls, buns, biscuits,white and wheat breads, pizza crust and traditional pasta made of white flour & egg white(vegetarian pasta  or spinach or wheat pasta is OK).  Multi-grain bread is OK - like multi-grain flat bread or sandwich thins. Avoid alcohol in excess. Exercise is also important.    Eat all the vegetables you want - avoid meat, especially red meat and dairy - especially cheese.  Cheese is the most concentrated form of trans-fats which is the worst thing to clog up our arteries. Veggie cheese is OK which can be found in the fresh produce section at [employer] or [employer] or [employer]  +++++++++++++++++++ DASH Eating Plan  DASH stands for "Dietary Approaches to Stop Hypertension."   The DASH eating plan is a healthy eating plan that has been shown to reduce high  blood pressure (hypertension). Additional health benefits may include reducing the risk of type 2 diabetes mellitus, heart disease, and stroke. The DASH eating plan may also help with weight loss. WHAT DO I NEED TO KNOW ABOUT THE DASH EATING PLAN? For the DASH eating plan, you will follow these general guidelines:  Choose foods with a percent daily value for sodium of less than 5% (as listed on the food label).  Use salt-free seasonings or herbs instead of table salt or sea salt.  Check with your health care provider or pharmacist before using salt substitutes.  Eat lower-sodium products, often labeled as "lower sodium" or "no salt added."  Eat fresh foods.  Eat more vegetables, fruits, and low-fat dairy products.  Choose whole grains. Look for the word "whole" as the first word in the ingredient list.  Choose fish   Limit sweets, desserts, sugars, and sugary drinks.  Choose heart-healthy fats.  Eat veggie cheese   Eat more home-cooked food and less restaurant, buffet, and fast food.  Limit fried foods.  Cook foods using methods other than frying.  Limit canned vegetables. If you do use them, rinse them well to decrease the sodium.  When eating at a restaurant, ask that your food be prepared with less salt, or no salt if possible.                      WHAT FOODS CAN I EAT? Read Dr Fara Olden Fuhrman's books on The End of Dieting & The End of Diabetes  Grains Whole grain or whole wheat bread. Brown rice. Whole grain or whole wheat pasta. Quinoa, bulgur, and whole grain cereals. Low-sodium cereals. Corn or whole wheat flour tortillas. Whole grain cornbread. Whole grain crackers. Low-sodium crackers.  Vegetables Fresh or frozen vegetables (raw, steamed, roasted, or grilled). Low-sodium or reduced-sodium tomato and vegetable juices. Low-sodium or reduced-sodium tomato sauce and paste. Low-sodium or reduced-sodium canned vegetables.   Fruits All fresh, canned (in natural juice), or  frozen fruits.  Protein Products  All fish and seafood.  Dried beans, peas, or lentils. Unsalted nuts and seeds. Unsalted canned beans.  Dairy Low-fat dairy products, such as skim or 1% milk, 2% or reduced-fat cheeses, low-fat ricotta or cottage cheese, or plain low-fat yogurt. Low-sodium or reduced-sodium cheeses.  Fats and Oils Tub margarines without trans fats. Light or reduced-fat mayonnaise and salad dressings (reduced sodium). Avocado. Safflower, olive, or canola oils. Natural peanut or almond butter.  Other Unsalted popcorn and pretzels. The items listed above may not be a complete list of recommended foods or beverages. Contact your dietitian for more options.  +++++++++++++++  WHAT FOODS ARE NOT RECOMMENDED? Grains/ White flour or wheat flour White bread. White pasta. White rice. Refined cornbread. Bagels and croissants. Crackers that contain trans fat.  Vegetables  Creamed or fried vegetables. Vegetables in a . Regular canned vegetables. Regular canned tomato sauce and paste. Regular tomato and vegetable juices.  Fruits Dried fruits. Canned fruit in light or heavy syrup. Fruit juice.  Meat and Other Protein Products Meat in general - RED meat & White meat.  Fatty cuts of meat. Ribs, chicken wings, all processed meats as bacon, sausage, bologna, salami, fatback, hot dogs, bratwurst and packaged luncheon meats.  Dairy Whole or 2% milk, cream, half-and-half, and cream cheese. Whole-fat or sweetened yogurt. Full-fat cheeses or blue cheese. Non-dairy creamers and whipped toppings. Processed cheese, cheese spreads, or cheese curds.  Condiments Onion and garlic salt, seasoned salt, table salt, and sea salt. Canned and packaged gravies. Worcestershire sauce. Tartar sauce. Barbecue sauce. Teriyaki sauce. Soy sauce, including reduced sodium. Steak sauce. Fish sauce. Oyster sauce. Cocktail sauce. Horseradish. Ketchup and mustard. Meat flavorings and tenderizers. Bouillon cubes. Hot  sauce. Tabasco sauce. Marinades. Taco seasonings. Relishes.  Fats and Oils Butter, stick margarine, lard, shortening and bacon fat. Coconut, palm kernel, or palm oils. Regular salad dressings.  Pickles and olives. Salted popcorn and pretzels.  The items listed above may not be a complete list of foods and beverages to avoid.

## 2019-05-11 ENCOUNTER — Other Ambulatory Visit: Payer: Self-pay | Admitting: Adult Health Nurse Practitioner

## 2019-05-11 DIAGNOSIS — B370 Candidal stomatitis: Secondary | ICD-10-CM

## 2019-05-11 MED ORDER — MAGIC MOUTHWASH W/LIDOCAINE: ORAL | 1 refills | Status: DC

## 2019-05-15 ENCOUNTER — Inpatient Hospital Stay: Payer: Medicare Other

## 2019-05-15 ENCOUNTER — Inpatient Hospital Stay (HOSPITAL_BASED_OUTPATIENT_CLINIC_OR_DEPARTMENT_OTHER): Payer: Medicare Other | Admitting: Hematology and Oncology

## 2019-05-15 ENCOUNTER — Other Ambulatory Visit: Payer: Self-pay

## 2019-05-15 DIAGNOSIS — C7951 Secondary malignant neoplasm of bone: Secondary | ICD-10-CM

## 2019-05-15 DIAGNOSIS — Z7189 Other specified counseling: Secondary | ICD-10-CM

## 2019-05-15 DIAGNOSIS — C541 Malignant neoplasm of endometrium: Secondary | ICD-10-CM

## 2019-05-15 DIAGNOSIS — D6481 Anemia due to antineoplastic chemotherapy: Secondary | ICD-10-CM | POA: Diagnosis not present

## 2019-05-15 DIAGNOSIS — G893 Neoplasm related pain (acute) (chronic): Secondary | ICD-10-CM | POA: Diagnosis not present

## 2019-05-15 DIAGNOSIS — T451X5A Adverse effect of antineoplastic and immunosuppressive drugs, initial encounter: Secondary | ICD-10-CM

## 2019-05-15 DIAGNOSIS — R600 Localized edema: Secondary | ICD-10-CM

## 2019-05-15 DIAGNOSIS — C7801 Secondary malignant neoplasm of right lung: Secondary | ICD-10-CM

## 2019-05-15 DIAGNOSIS — C787 Secondary malignant neoplasm of liver and intrahepatic bile duct: Secondary | ICD-10-CM

## 2019-05-15 DIAGNOSIS — Z5111 Encounter for antineoplastic chemotherapy: Secondary | ICD-10-CM | POA: Diagnosis not present

## 2019-05-15 LAB — CBC WITH DIFFERENTIAL (CANCER CENTER ONLY)
Abs Immature Granulocytes: 0.06 10*3/uL (ref 0.00–0.07)
Basophils Absolute: 0 10*3/uL (ref 0.0–0.1)
Basophils Relative: 0 %
Eosinophils Absolute: 0 10*3/uL (ref 0.0–0.5)
Eosinophils Relative: 0 %
HCT: 30.7 % — ABNORMAL LOW (ref 36.0–46.0)
Hemoglobin: 9.6 g/dL — ABNORMAL LOW (ref 12.0–15.0)
Immature Granulocytes: 1 %
Lymphocytes Relative: 3 %
Lymphs Abs: 0.3 10*3/uL — ABNORMAL LOW (ref 0.7–4.0)
MCH: 24.8 pg — ABNORMAL LOW (ref 26.0–34.0)
MCHC: 31.3 g/dL (ref 30.0–36.0)
MCV: 79.3 fL — ABNORMAL LOW (ref 80.0–100.0)
Monocytes Absolute: 0.4 10*3/uL (ref 0.1–1.0)
Monocytes Relative: 4 %
Neutro Abs: 7.9 10*3/uL — ABNORMAL HIGH (ref 1.7–7.7)
Neutrophils Relative %: 92 %
Platelet Count: 499 10*3/uL — ABNORMAL HIGH (ref 150–400)
RBC: 3.87 MIL/uL (ref 3.87–5.11)
RDW: 20.7 % — ABNORMAL HIGH (ref 11.5–15.5)
WBC Count: 8.6 10*3/uL (ref 4.0–10.5)
nRBC: 0.3 % — ABNORMAL HIGH (ref 0.0–0.2)

## 2019-05-15 LAB — CMP (CANCER CENTER ONLY)
ALT: 37 U/L (ref 0–44)
AST: 41 U/L (ref 15–41)
Albumin: 2.8 g/dL — ABNORMAL LOW (ref 3.5–5.0)
Alkaline Phosphatase: 245 U/L — ABNORMAL HIGH (ref 38–126)
Anion gap: 7 (ref 5–15)
BUN: 19 mg/dL (ref 8–23)
CO2: 31 mmol/L (ref 22–32)
Calcium: 9 mg/dL (ref 8.9–10.3)
Chloride: 96 mmol/L — ABNORMAL LOW (ref 98–111)
Creatinine: 0.72 mg/dL (ref 0.44–1.00)
GFR, Est AFR Am: >60 mL/min (ref >60)
GFR, Estimated: >60 mL/min (ref >60)
Glucose, Bld: 173 mg/dL — ABNORMAL HIGH (ref 70–99)
Potassium: 4.1 mmol/L (ref 3.5–5.1)
Sodium: 134 mmol/L — ABNORMAL LOW (ref 135–145)
Total Bilirubin: 0.4 mg/dL (ref 0.3–1.2)
Total Protein: 6 g/dL — ABNORMAL LOW (ref 6.5–8.1)

## 2019-05-15 LAB — SAMPLE TO BLOOD BANK

## 2019-05-15 MED ORDER — SODIUM CHLORIDE 0.9% FLUSH
10.0000 mL | Freq: Once | INTRAVENOUS | Status: AC
  Administered 2019-05-15: 10 mL
  Filled 2019-05-15: qty 10

## 2019-05-15 MED ORDER — SODIUM CHLORIDE 0.9% FLUSH
10.0000 mL | INTRAVENOUS | Status: DC | PRN
  Administered 2019-05-15: 10 mL
  Filled 2019-05-15: qty 10

## 2019-05-15 MED ORDER — SODIUM CHLORIDE 0.9 % IV SOLN
Freq: Once | INTRAVENOUS | Status: AC
  Administered 2019-05-15: 12:00:00 via INTRAVENOUS
  Filled 2019-05-15: qty 250

## 2019-05-15 MED ORDER — HEPARIN SOD (PORK) LOCK FLUSH 100 UNIT/ML IV SOLN
500.0000 [IU] | Freq: Once | INTRAVENOUS | Status: AC | PRN
  Administered 2019-05-15: 500 [IU]
  Filled 2019-05-15: qty 5

## 2019-05-15 MED ORDER — DEXAMETHASONE 4 MG PO TABS: ORAL_TABLET | ORAL | 0 refills | Status: DC

## 2019-05-15 MED ORDER — DEXAMETHASONE SODIUM PHOSPHATE 10 MG/ML IJ SOLN
INTRAMUSCULAR | Status: AC
  Filled 2019-05-15: qty 1

## 2019-05-15 MED ORDER — DEXAMETHASONE SODIUM PHOSPHATE 10 MG/ML IJ SOLN
10.0000 mg | Freq: Once | INTRAMUSCULAR | Status: AC
  Administered 2019-05-15: 10 mg via INTRAVENOUS

## 2019-05-15 MED ORDER — SODIUM CHLORIDE 0.9 % IV SOLN
45.0000 mg/m2 | Freq: Once | INTRAVENOUS | Status: AC
  Administered 2019-05-15: 80 mg via INTRAVENOUS
  Filled 2019-05-15: qty 8

## 2019-05-15 NOTE — Patient Instructions (Signed)
[facility] Discharge Instructions for Patients Receiving Chemotherapy  Today you received the following chemotherapy agents: taxotere.  To help prevent nausea and vomiting after your treatment, we encourage you to take your nausea medication as prescribed.   If you develop nausea and vomiting that is not controlled by your nausea medication, call the clinic.   BELOW ARE SYMPTOMS THAT SHOULD BE REPORTED IMMEDIATELY:  *FEVER GREATER THAN 100.5 F  *CHILLS WITH OR WITHOUT FEVER  NAUSEA AND VOMITING THAT IS NOT CONTROLLED WITH YOUR NAUSEA MEDICATION  *UNUSUAL SHORTNESS OF BREATH  *UNUSUAL BRUISING OR BLEEDING  TENDERNESS IN MOUTH AND THROAT WITH OR WITHOUT PRESENCE OF ULCERS  *URINARY PROBLEMS  *BOWEL PROBLEMS  UNUSUAL RASH Items with * indicate a potential emergency and should be followed up as soon as possible.  Feel free to call the clinic should you have any questions or concerns. The clinic phone number is [phone].  Please show the [place] at check-in to the Emergency Department and triage nurse.

## 2019-05-16 ENCOUNTER — Encounter: Payer: Self-pay | Admitting: Hematology and Oncology

## 2019-05-16 NOTE — Assessment & Plan Note (Signed)
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

## 2019-05-16 NOTE — Assessment & Plan Note (Signed)
She has evidence of fluid retention in her legs I suspect this is due to side effects of dexamethasone causing fluid retention With improved appetite, I recommend dexamethasone taper I prefer not to prescribe diuretic therapy Encourage ambulation

## 2019-05-16 NOTE — Assessment & Plan Note (Signed)
Her cancer pain is stable She will continue prescribed pain medicine as needed

## 2019-05-16 NOTE — Progress Notes (Signed)
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[facility]) She appears to have recovered from side effects of recent treatment As previously discussed, I will prescribe dose reduced Taxotere for the next few cycles She will continue aggressive supportive care at home through advanced home care service  Anemia due to antineoplastic chemotherapy The pattern of anemia is not consistent with iron deficiency, more like anemia chronic illness likely secondary to active disease She does not need iron infusion or blood transfusion We will monitor closely while she is on treatment  Cancer associated pain Her cancer pain is stable She will continue prescribed pain medicine as needed  Fluid retention in legs She has evidence of fluid retention in her legs I suspect this is due to side effects of dexamethasone causing fluid retention With improved appetite, I recommend dexamethasone taper I prefer not to prescribe diuretic therapy Encourage ambulation   No orders of the defined types were placed in this encounter.   INTERVAL HISTORY: Please see below for problem oriented charting. 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 SEE COMMENT.  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 The greatest extent of invasive carcinoma as measured on the needle core biopsy measures 0.6 cm.     Endometrial cancer ([facility])  02/03/2002 Pathology Results   1. BENIGN ENDOMETRIAL POLYPS.  2. UTERINE FIBROIDS: PORTIONS OF SMOOTH MUSCLE CONSISTENT WITH BENIGN LEIOMYOMA(S). PORTIONS OF BENIGN, CYSTICALLY ATROPHIC ENDOMETRIUM WITHOUT HYPERPLASIA OR EVIDENCE OF MALIGNANCY. 3. ENDOMETRIAL CURETTAGE: BENIGN ENDOMETRIUM AND SMOOTH MUSCLE.   09/07/2016 Pathology Results   PAP smear positive for malignant cells   09/07/2016 Initial Diagnosis   Patient had postmenopausal bleeding in 08-2016. She had PAP 09-07-16 by PCP Dr [name], which documented carcinoma. She had evaluation by Dr [name] with colposcopy/ ECC/endometrial biopsy documenting serous endometrioid endometrial carcinoma.   09/15/2016 Imaging   Ct imaging showed markedly thickened and heterogeneous endometrial stripe suspicious for endometrial carcinoma in this patient with postmenopausal vaginal bleeding. Cystic lesion in the right ovary cannot be definitively characterized. Pelvic ultrasound is recommended for further evaluation. Aortoiliac atherosclerosis. Avascular necrosis of the femoral heads bilaterally.   10/07/2016 Tumor Marker   Patient's tumor was tested for the following markers: CA125 Results of the tumor marker test revealed 15.1   10/13/2016 Pathology Results   1. Lymph node, sentinel, biopsy, right obturator - THERE IS NO EVIDENCE OF CARCINOMA IN 8 OF 8 LYMPH NODES (0/8). - SEE  COMMENT. 2. Lymph node, sentinel, biopsy, left obturator -  THERE IS NO EVIDENCE OF CARCINOMA IN 1 OF 1 LYMPH NODE (0/1). - SEE COMMENT. 3. Lymph node, sentinel, biopsy, left para-aortic - THERE IS NO EVIDENCE OF CARCINOMA IN 4 OF 4 LYMPH NODES (0/4). - SEE COMMENT. 4. Uterus +/- tubes/ovaries, neoplastic, cervix - INVASIVE MIXED SEROUS/ENDOMETRIOID ADENOCARCINOMA, MULTIPLE FOCI, FIGO GRADE III, THE LARGEST FOCUS SPANS 8.2 CM. - ADENOCARCINOMA EXTENDS INTO THE OUTER HALF OF THE MYOMETRIUM AND INVOLVES THE STROMA OF THE LOWER UTERINE SEGMENT AND CERVIX. - LYMPHOVASCULAR INVASION IS IDENTIFIED. - THE SURGICAL RESECTION MARGINS ARE NEGATIVE FOR CARCINOMA. - SEE ONCOLOGY TABLE BELOW. ADDITIONAL FINDINGS: - ENDOMETRIUM: ENDOMETRIOID TYPE POLYP(S), WHICH CONTAIN FOCI OF SIMILAR APPEARING MIXED SEROUS/ENDOMETRIOID ADENOCARCINOMA. - MYOMETRIUM: LEIOMYOMATA. - SEROSA: UNREMARKABLE. - BILATERAL ADNEXA: BENIGN OVARIES AND FALLOPIAN TUBES.   10/13/2016 Surgery   Surgery: Total robotic hysterectomy bilateral salpingo-oophorectomy, bilateral pelvic sentinel lymph node removal, left PA sentinel lymph node removal. Mini-laparotomy for specimen removal Surgeons:  [name], MD; [name], MD  Pathology:  1)Uterus, cervix, bilateral tubes and ovaries 2) Right obturator SLN 3) Left Obturator SLN 4) Left PA SLN Operative findings: 14 week fibroid uterus with one large dominant 6 cm myoma that was palpably calcified. 4 cm right ovarian cyst. + SLN identified in the bilateral obturator spaces, + SLN identified in the left PA space.    12/01/2016 - 05/31/2017 Chemotherapy   She received carboplatin & Taxol. She had 3 cycles of chemo followed by radiation and then 3 more cycles of chemo   01/11/2017 Genetic Testing   Testing was normal and did not reveal a mutation in these genes: The genes tested were the 80 genes on [employer]'s Multi-Cancer panel (ALK, APC, ATM, AXIN2, BAP1, BARD1, BLM,  BMPR1A, BRCA1, BRCA2, BRIP1, CASR, CDC73, CDH1, CDK4, CDKN1B, CDKN1C, CDKN2A, CEBPA, CHEK2, DICER1, DIS3L2, EGFR, EPCAM, FH, FLCN, GATA2, GPC3, GREM1, HOXB13,  HRAS, KIT, MAX, MEN1, MET, MITF, MLH1, MSH2, MSH6, MUTYH, NBN, NF1, NF2, PALB2, PDGFRA, PHOX2B, PMS2, POLD1, POLE, POT1, PRKAR1A, PTCH1, PTEN, RAD50, RAD51C, RAD51D, RB1, RECQL4, RET, RUNX1, SDHA, SDHAF2, SDHB, SDHC, SDHD, SMAD4, SMARCA4, SMARCB1, SMARCE1, STK11, SUFU, TERC, TERT, TMEM127, TP53, TSC1, TSC2, VHL, WRN, and WT1).   02/10/2017 - 04/12/2017 Radiation Therapy   02/10/17-03/16/17; 03/29/17-04/12/17;  1) Pelvis/ 45 Gy in 25 fractions  2) Vaginal Cuff/ 18 Gy in 3 fractions   08/26/2018 Imaging   Large destructive mass lesion left iliac bone with large soft tissue mass extending into the posterior soft tissues and pelvis compatible with metastatic disease. Consider follow-up CT chest abdomen pelvis with contrast for further staging.  Radiation changes at L5 and the sacrum. No lumbar metastatic deposits  Multilevel degenerative changes above.   08/29/2018 Imaging   1. 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 2. New irregular nodular opacity in the medial right middle lobe, new since 2017 CT. Separate subcentimeter right upper lobe solid pulmonary nodule, for which no comparison exists. These findings are indeterminate for pulmonary metastases and attention on follow-up chest CT in 3 months is recommended. 3. No lymphadenopathy or additional findings of metastatic disease.No tumor recurrence at the hysterectomy margin. 4. Aortic Atherosclerosis (ICD10-I70.0). Additional chronic findings as detailed.   08/29/2018 Tumor Marker   Patient's tumor was tested for the following markers: CA-125 Results of the tumor marker test revealed 17.4   09/06/2018 Procedure   CT-guided core biopsy performed of huge mass in the left iliac fossa destroying the left iliac bone.   09/06/2018 Pathology Results  Soft Tissue Needle Core Biopsy, Left Iliac Fossa - POORLY DIFFERENTIATED MALIGNANT NEOPLASM. Microscopic Comment The provided clinical history of both endometrial and breast cancer is noted. In the current case, immunohistochemistry for qualitative ER demonstrates scattered positive staining. CK8/18 is focally positive. CKAE1/3, CK7, CK20, TTF-1, CDX-2, GATA3 and PAX 8 are negative. The immunophenotype is not specific as to an origin for this poorly differentiated malignant neoplasm. The morphology is more suggestive of endometrial cancer than breast cancer, and may represent an unrecognized carcinosarcomatous component.   09/08/2018 - 09/27/2018 Radiation Therapy   She had palliative radiation treatment   09/29/2018 Procedure   Placement of single lumen port a cath via right internal jugular vein. The catheter tip lies at the cavo-atrial junction. A power injectable port a cath was placed and is ready for immediate use.   10/04/2018 - 01/17/2019 Chemotherapy   The patient had carboplatin and taxol x 6 cycles   12/05/2018 Tumor Marker   Patient's tumor was tested for the following markers: CA125 Results of the tumor marker test revealed 5.5   12/05/2018 Imaging   1. Considerable cystic degeneration in the large destructive mass of the left iliac bone. New or progressive left iliac bone pathologic fracture extending into the left SI joint, along with abnormal subluxation at the pubic symphysis indicating pubic symphysis instability. 2. Previous pleural-based nodularity in the right middle lobe is markedly improved, currently 4 mm in thickness and previously 11 mm in thickness. 3. Indistinct new wedge-shaped lesion in the left mid kidney, hypodense on portal venous phase images and hyperdense on delayed phase images, suggesting prolong tubular transit. This is a nonspecific finding but could reflect local inflammation, infection, or less likely infiltrative tumor. 4. Other imaging findings of  potential clinical significance: Aortic Atherosclerosis (ICD10-I70.0). Left main coronary artery atherosclerosis. 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   02/15/2019 Imaging   1. Slight progression of large expansile lytic lesion involving the left iliac bone. This demonstrates central low density, suggesting partial necrosis. This lesion was biopsied on 09/06/2018, revealing poorly differentiated malignant neoplasm. 2. New low-density lesion in the dome of the right hepatic lobe worrisome for cystic or treated metastasis. 3. No other evidence of metastatic disease. No explanation for the patient's symptoms. 4. 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    03/03/2019 - 04/07/2019 Anti-estrogen oral therapy   She cannot tolerate tamoxifen and megace   03/23/2019 Tumor Marker   Patient's tumor was tested for the following markers: CA125 Results of the tumor marker test revealed 3.8   04/13/2019 Imaging   1. 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 2. Mild decrease in size of large lytic bone metastasis and associated soft tissue mass involving the left ilium. 3. Unstable pathologic fracture of the left ilium, without significant change since prior exam. 4. New right lower lobe pulmonary metastases.     04/24/2019 -  Chemotherapy   The patient had DOCEtaxel (TAXOTERE) 130 mg in sodium chloride 0.9 % 250 mL chemo infusion, 75 mg/m2 = 130 mg, Intravenous,  Once, 2 of 4 cycles Dose modification: 50 mg/m2 (66.7 % of original dose 75 mg/m2, Cycle 2, Reason: Dose Not Tolerated), 45 mg/m2 (60 % of original dose 75 mg/m2, Cycle 2, Reason: Dose Not Tolerated) Administration: 130 mg (04/24/2019), 80 mg (05/15/2019)  for chemotherapy treatment.    05/01/2019 - 05/03/2019 Hospital Admission  She was admitted to the hospital for evaluation of fever  and weakness   Metastasis to bone ([place])  04/24/2019 -  Chemotherapy   The patient had DOCEtaxel (TAXOTERE) 130 mg in sodium chloride 0.9 % 250 mL chemo infusion, 75 mg/m2 = 130 mg, Intravenous,  Once, 2 of 4 cycles Dose modification: 50 mg/m2 (66.7 % of original dose 75 mg/m2, Cycle 2, Reason: Dose Not Tolerated), 45 mg/m2 (60 % of original dose 75 mg/m2, Cycle 2, Reason: Dose Not Tolerated) Administration: 130 mg (04/24/2019), 80 mg (05/15/2019)  for chemotherapy treatment.    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([place])  02/16/2019 Initial Diagnosis   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([place])   04/24/2019 -  Chemotherapy   The patient had DOCEtaxel (TAXOTERE) 130 mg in sodium chloride 0.9 % 250 mL chemo infusion, 75 mg/m2 = 130 mg, Intravenous,  Once, 2 of 4 cycles Dose modification: 50 mg/m2 (66.7 % of original dose 75 mg/m2, Cycle 2, Reason: Dose Not Tolerated), 45 mg/m2 (60 % of original dose 75 mg/m2, Cycle 2, Reason: Dose Not Tolerated) Administration: 130 mg (04/24/2019), 80 mg (05/15/2019)  for chemotherapy treatment.      REVIEW OF SYSTEMS:   Constitutional: Denies fevers, chills or abnormal weight loss Eyes: Denies blurriness of vision Ears, nose, mouth, throat, and face: Denies mucositis or sore throat Respiratory: Denies cough, dyspnea or wheezes Cardiovascular: Denies palpitation, chest discomfort Gastrointestinal:  Denies nausea, heartburn or change in bowel habits Skin: Denies abnormal skin rashes Lymphatics: Denies new lymphadenopathy or easy bruising Neurological:Denies numbness, tingling or new weaknesses Behavioral/Psych: Mood is stable, no new changes  All other systems were reviewed with the patient and are negative.  I have reviewed the past medical history, past surgical history, social history and family history with the patient and they are unchanged from previous note.  ALLERGIES:  is allergic to omnipaque [iohexol]; calan [verapamil]; effexor [venlafaxine]; erythromycin; femara [letrozole]; fosamax  [alendronate]; ibuprofen; paxil [paroxetine hcl]; remeron [mirtazapine]; and vasotec [enalapril].  MEDICATIONS:  Current Outpatient Medications  Medication Sig Dispense Refill  . acetaminophen (TYLENOL) 500 MG tablet Take 500 mg by mouth daily as needed for moderate pain or headache.    Marland Kitchen aspirin 81 MG tablet Take 81 mg by mouth daily.      . Calcium Carbonate-Vitamin D (CALCIUM 600 + D PO) Take 1 tablet by mouth 2 (two) times daily.     . Cholecalciferol (VITAMIN D) 2000 units CAPS Take 2,000 Units by mouth every other day.    Marland Kitchen dexamethasone (DECADRON) 4 MG tablet Take 2 mg every 2 days 60 tablet 0  . furosemide (LASIX) 20 MG tablet Once daily for 3 days, repeat as needed for lower extremity edema (Patient taking differently: Take 20 mg by mouth daily as needed for fluid or edema. ) 30 tablet 1  . levothyroxine (SYNTHROID, LEVOTHROID) 88 MCG tablet Take 1 tablet (88 mcg total) by mouth daily before breakfast. (Patient taking differently: Take 44-88 mcg by mouth as directed. Take 1/2 tablet (44 mcg) on MWF and Take 1 tablet (88 mcg) all other days) 90 tablet 1  . loratadine-pseudoephedrine (CLARITIN-D 24 HOUR) 10-240 MG 24 hr tablet Take 1 tablet by mouth daily as needed for allergies.    . magic mouthwash w/lidocaine SOLN Swish 67m by mouth and swallow, three times a day as needed for mouth pain. 240 mL 1  . metFORMIN (GLUCOPHAGE-XR) 500 MG 24 hr tablet Take 1 tablet (500 mg total) by mouth 2 (two) times daily. (Patient taking differently:  Take 500 mg by mouth 2 (two) times a day. ) 360 tablet 1  . Multiple Vitamin (MULTIVITAMIN WITH MINERALS) TABS tablet Take 1 tablet by mouth daily.    . ondansetron (ZOFRAN) 8 MG tablet Take 1 tablet (8 mg total) by mouth every 8 (eight) hours as needed for refractory nausea / vomiting. Start on day 3 after chemo. 30 tablet 1  . pantoprazole (PROTONIX) 40 MG tablet Take 1 tablet (40 mg total) by mouth 2 (two) times daily for 30 days. 60 tablet 0  . pravastatin  (PRAVACHOL) 40 MG tablet Take 1 tablet (40 mg total) by mouth every evening. 90 tablet 1  . prochlorperazine (COMPAZINE) 10 MG tablet Take 1 tablet (10 mg total) by mouth every 6 (six) hours as needed (Nausea or vomiting). 30 tablet 1  . simethicone (MYLICON) 40 LZ/7.6BH drops Take 0.3 mLs (20 mg total) by mouth 4 (four) times daily as needed for flatulence. (Patient not taking: Reported on 05/09/2019) 30 mL 0  . traMADol (ULTRAM) 50 MG tablet Take 1 tablet (50 mg total) by mouth 3 (three) times daily. Take 1/2 to 1 tablet every 4 hours as needed for severe Back Pain (Patient taking differently: Take 25 mg by mouth every 4 (four) hours as needed for moderate pain or severe pain. Take 1/2 to 1 tablet every 4 hours as needed for severe Back Pain) 90 tablet 0  . triamterene-hydrochlorothiazide (MAXZIDE) 75-50 MG tablet Take 0.5 tablets by mouth daily.      No current facility-administered medications for this visit.    Facility-Administered Medications Ordered in Other Visits  Medication Dose Route Frequency Provider Last Rate Last Dose  . sodium chloride flush (NS) 0.9 % injection 10 mL  10 mL Intracatheter PRN [name], MD   10 mL at 12/27/18 1825    PHYSICAL EXAMINATION: ECOG PERFORMANCE STATUS: 2 - Symptomatic, <50% confined to bed  Vitals:   05/15/19 1057  BP: 117/68  Pulse: 85  Resp: 18  Temp: 98.5 F (36.9 C)  SpO2: 98%   Filed Weights   05/15/19 1057  Weight: 142 lb (64.4 kg)    GENERAL:alert, no distress and comfortable SKIN: skin color, texture, turgor are normal, no rashes or significant lesions EYES: normal, Conjunctiva are pink and non-injected, sclera clear OROPHARYNX:no exudate, no erythema and lips, buccal mucosa, and tongue normal  NECK: supple, thyroid normal size, non-tender, without nodularity LYMPH:  no palpable lymphadenopathy in the cervical, axillary or inguinal LUNGS: clear to auscultation and percussion with normal breathing effort HEART: regular rate &  rhythm and no murmurs with bilateral lower extremity edema ABDOMEN:abdomen soft, non-tender and normal bowel sounds Musculoskeletal:no cyanosis of digits and no clubbing  NEURO: alert & oriented x 3 with fluent speech, no focal motor/sensory deficits  LABORATORY DATA:  I have reviewed the data as listed    Component Value Date/Time   NA 134 (L) 05/15/2019 1011   NA 137 05/31/2017 0931   K 4.1 05/15/2019 1011   K 3.5 05/31/2017 0931   CL 96 (L) 05/15/2019 1011   CL 99 12/20/2012 1336   CO2 31 05/15/2019 1011   CO2 25 05/31/2017 0931   GLUCOSE 173 (H) 05/15/2019 1011   GLUCOSE 161 (H) 05/31/2017 0931   GLUCOSE 110 (H) 12/20/2012 1336   BUN 19 05/15/2019 1011   BUN 10.9 05/31/2017 0931   CREATININE 0.72 05/15/2019 1011   CREATININE 0.63 07/05/2018 1009   CREATININE 0.8 05/31/2017 0931   CALCIUM 9.0 05/15/2019 1011  CALCIUM 10.6 (H) 05/31/2017 0931   PROT 6.0 (L) 05/15/2019 1011   PROT 7.5 05/31/2017 0931   ALBUMIN 2.8 (L) 05/15/2019 1011   ALBUMIN 4.2 05/31/2017 0931   AST 41 05/15/2019 1011   AST 16 05/31/2017 0931   ALT 37 05/15/2019 1011   ALT 15 05/31/2017 0931   ALKPHOS 245 (H) 05/15/2019 1011   ALKPHOS 91 05/31/2017 0931   BILITOT 0.4 05/15/2019 1011   BILITOT 0.33 05/31/2017 0931   GFRNONAA >60 05/15/2019 1011   GFRNONAA 87 07/05/2018 1009   GFRAA >60 05/15/2019 1011   GFRAA 100 07/05/2018 1009    No results found for: SPEP, UPEP  Lab Results  Component Value Date   WBC 8.6 05/15/2019   NEUTROABS 7.9 (H) 05/15/2019   HGB 9.6 (L) 05/15/2019   HCT 30.7 (L) 05/15/2019   MCV 79.3 (L) 05/15/2019   PLT 499 (H) 05/15/2019      Chemistry      Component Value Date/Time   NA 134 (L) 05/15/2019 1011   NA 137 05/31/2017 0931   K 4.1 05/15/2019 1011   K 3.5 05/31/2017 0931   CL 96 (L) 05/15/2019 1011   CL 99 12/20/2012 1336   CO2 31 05/15/2019 1011   CO2 25 05/31/2017 0931   BUN 19 05/15/2019 1011   BUN 10.9 05/31/2017 0931   CREATININE 0.72 05/15/2019  1011   CREATININE 0.63 07/05/2018 1009   CREATININE 0.8 05/31/2017 0931      Component Value Date/Time   CALCIUM 9.0 05/15/2019 1011   CALCIUM 10.6 (H) 05/31/2017 0931   ALKPHOS 245 (H) 05/15/2019 1011   ALKPHOS 91 05/31/2017 0931   AST 41 05/15/2019 1011   AST 16 05/31/2017 0931   ALT 37 05/15/2019 1011   ALT 15 05/31/2017 0931   BILITOT 0.4 05/15/2019 1011   BILITOT 0.33 05/31/2017 0931       RADIOGRAPHIC STUDIES: I have personally reviewed the radiological images as listed and agreed with the findings in the report. Ct Head Wo Contrast  Result Date: 05/01/2019 CLINICAL DATA:  Posttraumatic headache after fall today. EXAM: CT HEAD WITHOUT CONTRAST CT CERVICAL SPINE WITHOUT CONTRAST TECHNIQUE: Multidetector CT imaging of the head and cervical spine was performed following the standard protocol without intravenous contrast. Multiplanar CT image reconstructions of the cervical spine were also generated. COMPARISON:  None. FINDINGS: CT HEAD FINDINGS Brain: Mild diffuse cortical atrophy is noted. Mild chronic ischemic white matter disease is noted. No mass effect or midline shift is noted. Ventricular size is within normal limits. There is no evidence of mass lesion, hemorrhage or acute infarction. Vascular: No hyperdense vessel or unexpected calcification. Skull: Normal. Negative for fracture or focal lesion. Sinuses/Orbits: No acute finding. Other: None. CT CERVICAL SPINE FINDINGS Alignment: Minimal grade 1 anterolisthesis of C4-5 is noted secondary to posterior facet joint hypertrophy. Skull base and vertebrae: No acute fracture. No primary bone lesion or focal pathologic process. Soft tissues and spinal canal: No prevertebral fluid or swelling. No visible canal hematoma. Disc levels: Mild degenerative disc disease is noted at C5-6 with anterior posterior osteophyte formation. There appears to be fusion of the C6-7 disc space most likely due to degenerative change. Upper chest: Negative. Other:  Degenerative changes are seen involving posterior facet joints bilaterally. IMPRESSION: Mild diffuse cortical atrophy. Mild chronic ischemic white matter disease. No acute intracranial abnormality seen. Multilevel degenerative disc disease. No acute abnormality seen in the cervical spine. Electronically Signed   By: Bobbe Medico.D.  On: 05/01/2019 15:17   Ct Cervical Spine Wo Contrast  Result Date: 05/01/2019 CLINICAL DATA:  Posttraumatic headache after fall today. EXAM: CT HEAD WITHOUT CONTRAST CT CERVICAL SPINE WITHOUT CONTRAST TECHNIQUE: Multidetector CT imaging of the head and cervical spine was performed following the standard protocol without intravenous contrast. Multiplanar CT image reconstructions of the cervical spine were also generated. COMPARISON:  None. FINDINGS: CT HEAD FINDINGS Brain: Mild diffuse cortical atrophy is noted. Mild chronic ischemic white matter disease is noted. No mass effect or midline shift is noted. Ventricular size is within normal limits. There is no evidence of mass lesion, hemorrhage or acute infarction. Vascular: No hyperdense vessel or unexpected calcification. Skull: Normal. Negative for fracture or focal lesion. Sinuses/Orbits: No acute finding. Other: None. CT CERVICAL SPINE FINDINGS Alignment: Minimal grade 1 anterolisthesis of C4-5 is noted secondary to posterior facet joint hypertrophy. Skull base and vertebrae: No acute fracture. No primary bone lesion or focal pathologic process. Soft tissues and spinal canal: No prevertebral fluid or swelling. No visible canal hematoma. Disc levels: Mild degenerative disc disease is noted at C5-6 with anterior posterior osteophyte formation. There appears to be fusion of the C6-7 disc space most likely due to degenerative change. Upper chest: Negative. Other: Degenerative changes are seen involving posterior facet joints bilaterally. IMPRESSION: Mild diffuse cortical atrophy. Mild chronic ischemic white matter disease. No  acute intracranial abnormality seen. Multilevel degenerative disc disease. No acute abnormality seen in the cervical spine. Electronically Signed   By: Marijo Conception M.D.   On: 05/01/2019 15:17    All questions were answered. The patient knows to call the clinic with any problems, questions or concerns. No barriers to learning was detected.  I spent 15 minutes counseling the patient face to face. The total time spent in the appointment was 20 minutes and more than 50% was on counseling and review of test results  [name], MD 05/16/2019 7:32 AM

## 2019-05-16 NOTE — Assessment & Plan Note (Signed)
She appears to have recovered from side effects of recent treatment As previously discussed, I will prescribe dose reduced Taxotere for the next few cycles She will continue aggressive supportive care at home through advanced home care service

## 2019-05-17 ENCOUNTER — Telehealth: Payer: Self-pay

## 2019-05-17 NOTE — Telephone Encounter (Signed)
Son [name] called and left a message to call him. Called back. [facility] is waiting on the office to fax back orders for nursing and home health aide/ IV fluids. Told him I faxed orders yesterday. Dr. Alvy Bimler does not recommend IV fluids due to fluid retention and leg swelling. [name] verbalized understanding and is just concerned his Mom will not drink enough without someone encouraging her to do so and she may get dehydrated. [name], son is interested is getting information on the ride program for his Mom for appts. He does not live in town and his Dad has had a stroke.  Called the transportation program number and they will call the son [name] and share information on the program. [facility] and spoke with Santiago Glad, they have orders and will be seeing Ms. Volpi. They have already seen the patient.

## 2019-05-18 ENCOUNTER — Telehealth: Payer: Self-pay | Admitting: Oncology

## 2019-05-18 NOTE — Telephone Encounter (Signed)
Called Tammy Hensley to see how she is feeling.  She said she is feeling good today and is taking it easy.  Over the past few days she said she has been feeling good and bad on and off with more time feeling good.  She denies having any pain.  She said her appetite has been better on the steroids and she hasn't had any trouble with constipation.  [facility] has been to see her and are coming today for PT.  She said she has been walking with them in the house.

## 2019-05-22 ENCOUNTER — Telehealth: Payer: Self-pay | Admitting: Oncology

## 2019-05-22 ENCOUNTER — Other Ambulatory Visit: Payer: Self-pay

## 2019-05-22 ENCOUNTER — Emergency Department (HOSPITAL_COMMUNITY): Payer: Medicare Other

## 2019-05-22 ENCOUNTER — Encounter (HOSPITAL_COMMUNITY): Payer: Self-pay

## 2019-05-22 ENCOUNTER — Inpatient Hospital Stay (HOSPITAL_COMMUNITY)
Admission: EM | Admit: 2019-05-22 | Discharge: 2019-05-26 | DRG: 871 | Disposition: A | Discharge status: Home Health | Payer: Medicare Other | Attending: Internal Medicine | Admitting: Internal Medicine

## 2019-05-22 ENCOUNTER — Observation Stay (HOSPITAL_COMMUNITY): Payer: Medicare Other

## 2019-05-22 DIAGNOSIS — Z9181 History of falling: Secondary | ICD-10-CM

## 2019-05-22 DIAGNOSIS — C787 Secondary malignant neoplasm of liver and intrahepatic bile duct: Secondary | ICD-10-CM | POA: Diagnosis present

## 2019-05-22 DIAGNOSIS — Z923 Personal history of irradiation: Secondary | ICD-10-CM

## 2019-05-22 DIAGNOSIS — E440 Moderate protein-calorie malnutrition: Secondary | ICD-10-CM | POA: Diagnosis present

## 2019-05-22 DIAGNOSIS — G893 Neoplasm related pain (acute) (chronic): Secondary | ICD-10-CM | POA: Diagnosis not present

## 2019-05-22 DIAGNOSIS — Z79890 Hormone replacement therapy (postmenopausal): Secondary | ICD-10-CM

## 2019-05-22 DIAGNOSIS — Z888 Allergy status to other drugs, medicaments and biological substances status: Secondary | ICD-10-CM

## 2019-05-22 DIAGNOSIS — Z66 Do not resuscitate: Secondary | ICD-10-CM | POA: Diagnosis present

## 2019-05-22 DIAGNOSIS — A419 Sepsis, unspecified organism: Secondary | ICD-10-CM | POA: Diagnosis not present

## 2019-05-22 DIAGNOSIS — M8580 Other specified disorders of bone density and structure, unspecified site: Secondary | ICD-10-CM | POA: Diagnosis present

## 2019-05-22 DIAGNOSIS — Z90722 Acquired absence of ovaries, bilateral: Secondary | ICD-10-CM

## 2019-05-22 DIAGNOSIS — Z6826 Body mass index (BMI) 26.0-26.9, adult: Secondary | ICD-10-CM

## 2019-05-22 DIAGNOSIS — J189 Pneumonia, unspecified organism: Secondary | ICD-10-CM

## 2019-05-22 DIAGNOSIS — R531 Weakness: Secondary | ICD-10-CM | POA: Diagnosis not present

## 2019-05-22 DIAGNOSIS — E860 Dehydration: Secondary | ICD-10-CM | POA: Diagnosis present

## 2019-05-22 DIAGNOSIS — C7801 Secondary malignant neoplasm of right lung: Secondary | ICD-10-CM | POA: Diagnosis present

## 2019-05-22 DIAGNOSIS — R64 Cachexia: Secondary | ICD-10-CM | POA: Diagnosis present

## 2019-05-22 DIAGNOSIS — D61818 Other pancytopenia: Secondary | ICD-10-CM

## 2019-05-22 DIAGNOSIS — S0083XA Contusion of other part of head, initial encounter: Secondary | ICD-10-CM | POA: Diagnosis present

## 2019-05-22 DIAGNOSIS — I10 Essential (primary) hypertension: Secondary | ICD-10-CM

## 2019-05-22 DIAGNOSIS — Z7984 Long term (current) use of oral hypoglycemic drugs: Secondary | ICD-10-CM

## 2019-05-22 DIAGNOSIS — I700 Atherosclerosis of aorta: Secondary | ICD-10-CM | POA: Diagnosis present

## 2019-05-22 DIAGNOSIS — D701 Agranulocytosis secondary to cancer chemotherapy: Secondary | ICD-10-CM

## 2019-05-22 DIAGNOSIS — C50412 Malignant neoplasm of upper-outer quadrant of left female breast: Secondary | ICD-10-CM

## 2019-05-22 DIAGNOSIS — Z853 Personal history of malignant neoplasm of breast: Secondary | ICD-10-CM

## 2019-05-22 DIAGNOSIS — C7802 Secondary malignant neoplasm of left lung: Secondary | ICD-10-CM | POA: Diagnosis present

## 2019-05-22 DIAGNOSIS — R509 Fever, unspecified: Secondary | ICD-10-CM | POA: Diagnosis not present

## 2019-05-22 DIAGNOSIS — D6481 Anemia due to antineoplastic chemotherapy: Secondary | ICD-10-CM | POA: Diagnosis present

## 2019-05-22 DIAGNOSIS — Z8042 Family history of malignant neoplasm of prostate: Secondary | ICD-10-CM

## 2019-05-22 DIAGNOSIS — D709 Neutropenia, unspecified: Secondary | ICD-10-CM

## 2019-05-22 DIAGNOSIS — R600 Localized edema: Secondary | ICD-10-CM

## 2019-05-22 DIAGNOSIS — Z7982 Long term (current) use of aspirin: Secondary | ICD-10-CM

## 2019-05-22 DIAGNOSIS — R5081 Fever presenting with conditions classified elsewhere: Secondary | ICD-10-CM

## 2019-05-22 DIAGNOSIS — Z170 Estrogen receptor positive status [ER+]: Secondary | ICD-10-CM

## 2019-05-22 DIAGNOSIS — C541 Malignant neoplasm of endometrium: Secondary | ICD-10-CM

## 2019-05-22 DIAGNOSIS — E782 Mixed hyperlipidemia: Secondary | ICD-10-CM

## 2019-05-22 DIAGNOSIS — Z886 Allergy status to analgesic agent status: Secondary | ICD-10-CM

## 2019-05-22 DIAGNOSIS — E039 Hypothyroidism, unspecified: Secondary | ICD-10-CM

## 2019-05-22 DIAGNOSIS — R296 Repeated falls: Secondary | ICD-10-CM | POA: Diagnosis present

## 2019-05-22 DIAGNOSIS — E785 Hyperlipidemia, unspecified: Secondary | ICD-10-CM | POA: Diagnosis present

## 2019-05-22 DIAGNOSIS — C7951 Secondary malignant neoplasm of bone: Secondary | ICD-10-CM

## 2019-05-22 DIAGNOSIS — Z79891 Long term (current) use of opiate analgesic: Secondary | ICD-10-CM

## 2019-05-22 DIAGNOSIS — Z803 Family history of malignant neoplasm of breast: Secondary | ICD-10-CM

## 2019-05-22 DIAGNOSIS — E876 Hypokalemia: Secondary | ICD-10-CM | POA: Diagnosis not present

## 2019-05-22 DIAGNOSIS — D638 Anemia in other chronic diseases classified elsewhere: Secondary | ICD-10-CM | POA: Diagnosis not present

## 2019-05-22 DIAGNOSIS — E119 Type 2 diabetes mellitus without complications: Secondary | ICD-10-CM | POA: Diagnosis present

## 2019-05-22 DIAGNOSIS — Z20828 Contact with and (suspected) exposure to other viral communicable diseases: Secondary | ICD-10-CM | POA: Diagnosis present

## 2019-05-22 DIAGNOSIS — Z79899 Other long term (current) drug therapy: Secondary | ICD-10-CM

## 2019-05-22 DIAGNOSIS — D703 Neutropenia due to infection: Secondary | ICD-10-CM | POA: Diagnosis present

## 2019-05-22 DIAGNOSIS — Y92009 Unspecified place in unspecified non-institutional (private) residence as the place of occurrence of the external cause: Secondary | ICD-10-CM

## 2019-05-22 DIAGNOSIS — G9341 Metabolic encephalopathy: Secondary | ICD-10-CM | POA: Diagnosis present

## 2019-05-22 DIAGNOSIS — Z881 Allergy status to other antibiotic agents status: Secondary | ICD-10-CM

## 2019-05-22 DIAGNOSIS — D630 Anemia in neoplastic disease: Secondary | ICD-10-CM | POA: Diagnosis present

## 2019-05-22 DIAGNOSIS — W19XXXA Unspecified fall, initial encounter: Secondary | ICD-10-CM

## 2019-05-22 DIAGNOSIS — T451X5A Adverse effect of antineoplastic and immunosuppressive drugs, initial encounter: Secondary | ICD-10-CM

## 2019-05-22 DIAGNOSIS — Z90710 Acquired absence of both cervix and uterus: Secondary | ICD-10-CM

## 2019-05-22 DIAGNOSIS — Z91041 Radiographic dye allergy status: Secondary | ICD-10-CM

## 2019-05-22 DIAGNOSIS — W1830XA Fall on same level, unspecified, initial encounter: Secondary | ICD-10-CM | POA: Diagnosis present

## 2019-05-22 DIAGNOSIS — R519 Headache, unspecified: Secondary | ICD-10-CM

## 2019-05-22 LAB — COMPREHENSIVE METABOLIC PANEL
ALT: 26 U/L (ref 0–44)
AST: 30 U/L (ref 15–41)
Albumin: 2.7 g/dL — ABNORMAL LOW (ref 3.5–5.0)
Alkaline Phosphatase: 199 U/L — ABNORMAL HIGH (ref 38–126)
Anion gap: 11 (ref 5–15)
BUN: 15 mg/dL (ref 8–23)
CO2: 27 mmol/L (ref 22–32)
Calcium: 8.4 mg/dL — ABNORMAL LOW (ref 8.9–10.3)
Chloride: 97 mmol/L — ABNORMAL LOW (ref 98–111)
Creatinine, Ser: 0.59 mg/dL (ref 0.44–1.00)
GFR calc Af Amer: >60 mL/min (ref >60)
GFR calc non Af Amer: >60 mL/min (ref >60)
Glucose, Bld: 125 mg/dL — ABNORMAL HIGH (ref 70–99)
Potassium: 3.7 mmol/L (ref 3.5–5.1)
Sodium: 135 mmol/L (ref 135–145)
Total Bilirubin: 0.5 mg/dL (ref 0.3–1.2)
Total Protein: 5.4 g/dL — ABNORMAL LOW (ref 6.5–8.1)

## 2019-05-22 LAB — URINALYSIS, ROUTINE W REFLEX MICROSCOPIC
Bilirubin Urine: NEGATIVE
Glucose, UA: NEGATIVE mg/dL
Hgb urine dipstick: NEGATIVE
Ketones, ur: NEGATIVE mg/dL
Leukocytes,Ua: NEGATIVE
Nitrite: NEGATIVE
Protein, ur: NEGATIVE mg/dL
Specific Gravity, Urine: 1.012 (ref 1.005–1.030)
pH: 7 (ref 5.0–8.0)

## 2019-05-22 LAB — SARS CORONAVIRUS 2 BY RT PCR (HOSPITAL ORDER, PERFORMED IN ~~LOC~~ HOSPITAL LAB): SARS Coronavirus 2: NEGATIVE

## 2019-05-22 LAB — CBC WITH DIFFERENTIAL/PLATELET
Abs Immature Granulocytes: 0.02 10*3/uL (ref 0.00–0.07)
Basophils Absolute: 0 10*3/uL (ref 0.0–0.1)
Basophils Relative: 1 %
Eosinophils Absolute: 0 10*3/uL (ref 0.0–0.5)
Eosinophils Relative: 0 %
HCT: 28.3 % — ABNORMAL LOW (ref 36.0–46.0)
Hemoglobin: 9 g/dL — ABNORMAL LOW (ref 12.0–15.0)
Immature Granulocytes: 3 %
Lymphocytes Relative: 41 %
Lymphs Abs: 0.3 10*3/uL — ABNORMAL LOW (ref 0.7–4.0)
MCH: 25.1 pg — ABNORMAL LOW (ref 26.0–34.0)
MCHC: 31.8 g/dL (ref 30.0–36.0)
MCV: 78.8 fL — ABNORMAL LOW (ref 80.0–100.0)
Monocytes Absolute: 0.1 10*3/uL (ref 0.1–1.0)
Monocytes Relative: 17 %
Neutro Abs: 0.3 10*3/uL — ABNORMAL LOW (ref 1.7–7.7)
Neutrophils Relative %: 38 %
Platelets: 403 10*3/uL — ABNORMAL HIGH (ref 150–400)
RBC: 3.59 MIL/uL — ABNORMAL LOW (ref 3.87–5.11)
RDW: 21.2 % — ABNORMAL HIGH (ref 11.5–15.5)
WBC: 0.8 10*3/uL — CL (ref 4.0–10.5)
nRBC: 23.1 % — ABNORMAL HIGH (ref 0.0–0.2)

## 2019-05-22 LAB — LACTIC ACID, PLASMA: Lactic Acid, Venous: 1.3 mmol/L (ref 0.5–1.9)

## 2019-05-22 MED ORDER — ACETAMINOPHEN 325 MG PO TABS
650.0000 mg | ORAL_TABLET | Freq: Once | ORAL | Status: AC
  Administered 2019-05-22: 21:00:00 650 mg via ORAL
  Filled 2019-05-22: qty 2

## 2019-05-22 MED ORDER — VANCOMYCIN HCL IN DEXTROSE 1-5 GM/200ML-% IV SOLN
1000.0000 mg | Freq: Once | INTRAVENOUS | Status: AC
  Administered 2019-05-22: 1000 mg via INTRAVENOUS
  Filled 2019-05-22: qty 200

## 2019-05-22 MED ORDER — METRONIDAZOLE IN NACL 5-0.79 MG/ML-% IV SOLN
500.0000 mg | Freq: Once | INTRAVENOUS | Status: AC
  Administered 2019-05-22: 21:00:00 500 mg via INTRAVENOUS
  Filled 2019-05-22: qty 100

## 2019-05-22 MED ORDER — SODIUM CHLORIDE 0.9 % IV SOLN
INTRAVENOUS | Status: DC
  Administered 2019-05-22: 20:00:00 via INTRAVENOUS

## 2019-05-22 MED ORDER — SODIUM CHLORIDE 0.9 % IV SOLN
2.0000 g | Freq: Once | INTRAVENOUS | Status: AC
  Administered 2019-05-22: 2 g via INTRAVENOUS
  Filled 2019-05-22: qty 2

## 2019-05-22 NOTE — ED Notes (Signed)
Bed: WU93 Expected date:  Expected time:  Means of arrival:  Comments: EMS: Elderly, increased weakness

## 2019-05-22 NOTE — Telephone Encounter (Signed)
Tammy Hensley called and said she got up to use the bathroom Thursday night (around 3 am).  Her husband was with her and was getting her walker ready but she missed it and fell.  She said she didn't think she hurt anything so she went to bed.  When she got up at 10 am she noticed "black spots under my eyes and across the bridge of my nose."  She denies having headaches or pain anywhere else.  I asked if the spots have faded or changed color and she said no.  Advised they may need to just fade and it may take a while.  Also advised her that I will notify Dr. Alvy Bimler to see if she needs to do anything else.

## 2019-05-22 NOTE — Telephone Encounter (Signed)
I am not sure if she wants another CT head for evaluation Advise her in the future not to wait too long to seek medical attention Make sure she is not taking any diuretic (Lasix or Maxide) I can see her tomorrow morning at 10 am if she wishes. She will need lab draw before

## 2019-05-22 NOTE — Telephone Encounter (Signed)
Called Tammy Hensley back and advised her of Dr. Calton Dach message.  She is not sure if she needs a CT head or not.  She is not having any headaches and thinks she his her face.  She is taking Maxide and took a dose this morning.  Advised her not to take any more.  She would like to see Dr. Alvy Bimler tomorrow so scheduled appointment for labs/flush at 9:15 and see Dr. Alvy Bimler at 10 am.

## 2019-05-22 NOTE — ED Notes (Signed)
XR at bedside

## 2019-05-22 NOTE — ED Notes (Signed)
Urine and culture sent to lab  

## 2019-05-22 NOTE — ED Triage Notes (Signed)
EMS reports from home, Stage 4 CA Pt, Chemo Pt. Family called for increased weakness and fever today. Recent Covid test negative.  BP 112/86 HR 116 RR 28 Sp02 98 RA CBG 123 Temp 101.8  20ga LAC 500 NS enroute

## 2019-05-22 NOTE — Progress Notes (Signed)
A consult was received from an ED physician for Cefepime & Vanc per pharmacy dosing.  The patient's profile has been reviewed for ht/wt/allergies/indication/available labs.   A one time order has been placed for Vancomycin 1gm & Cefepime 2gm.  Further antibiotics/pharmacy consults should be ordered by admitting physician if indicated.                       Thank you, Biagio Borg 05/22/2019  7:54 PM

## 2019-05-22 NOTE — ED Notes (Signed)
ED TO INPATIENT HANDOFF REPORT  Name/Age/Gender Tammy Hensley 78 y.o. female  Code Status Code Status History    Date Active Date Inactive Code Status Order ID Comments User Context   05/01/2019 1939 05/03/2019 1903 DNR [id number]  Mendel [facility], MD Inpatient   10/13/2016 1505 10/14/2016 1424 Full Code [id number]  [name], MD Inpatient   Advance Care Planning Activity    Questions for Most Recent Historical Code Status (Order [id number])    Question Answer Comment   In the event of cardiac or respiratory ARREST Do not call a "code blue"    In the event of cardiac or respiratory ARREST Do not perform Intubation, CPR, defibrillation or ACLS    In the event of cardiac or respiratory ARREST Use medication by any route, position, wound care, and other measures to relive pain and suffering. May use oxygen, suction and manual treatment of airway obstruction as needed for comfort.         Advance Directive Documentation     Most Recent Value  Type of Advance Directive  Healthcare Power of Attorney  Pre-existing out of facility DNR order (yellow form or pink MOST form)  -  "MOST" Form in Place?  -      Home/SNF/Other Home  Chief Complaint Fever and Weakness  Level of Care/Admitting Diagnosis ED Disposition    ED Disposition Condition [place]: [facility] [[id number]]  Level of Care: Telemetry [5]  Admit to tele based on following criteria: Other see comments  Comments: tachycardia  Covid Evaluation: Confirmed COVID Negative  Diagnosis: Neutropenic fever ([place]) [660600]  Admitting Physician: Toy Baker [[id number]]  Attending Physician: Toy Baker [[id number]]  PT Class (Do Not Modify): Observation [104]  PT Acc Code (Do Not Modify): Observation [10022]       Medical History Past Medical History:  Diagnosis Date  . 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 Breast cancer ([place]) 2006   left breast   . Depression   . Diabetes ([place])    borderline  . Endometrial ca ([place]) 2018  . Family history of cancer   . History of brachytherapy 03/29/17-04/12/17   vaginal cuff 18 Gy in 3 fractions  . History of radiation therapy 02/10/17-03/16/17   pelvis 45 Gy in 25 fractions  . Hyperlipidemia   . Hypertension   . Iron deficiency anemia   . 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 Vitamin D deficiency     Allergies Allergies  Allergen Reactions  . Omnipaque [Iohexol] Hives    Pt will need 13 hr prep in the future--SLG  . Calan [Verapamil]     Constipation   . Effexor [Venlafaxine]     unknown  . Erythromycin Nausea And Vomiting  . Femara [Letrozole] Swelling  . Fosamax [Alendronate]     aching  . Ibuprofen     Dizziness, incoherent   . Paxil [Paroxetine Hcl] Nausea Only  . Remeron [Mirtazapine]     Weight gain   . Vasotec [Enalapril] Cough    IV Location/Drains/Wounds Patient Lines/Drains/Airways Status   Active Line/Drains/Airways    Name:   Placement date:   Placement time:   Site:   Days:   Implanted Port 09/29/18 Right Chest   09/29/18    1150    Chest   235   Implanted Port 05/01/19 Right Chest   05/01/19    1321    Chest  21   Peripheral IV 05/22/19 Anterior;Left;Proximal Forearm   05/22/19    1812    Forearm   less than 1   Peripheral IV 05/22/19 Right Antecubital   05/22/19    2016    Antecubital   less than 1   Incision (Closed) 10/13/16 Abdomen   10/13/16    1304     951   Incision - 5 Ports Abdomen 1: Umbilicus 2: Right;Lateral 3: Left;Lateral;Upper 4: Left;Lateral;Mid 5: Left;Lateral;Lower   10/13/16    1100     951          Labs/Imaging Results for orders placed or performed during the hospital encounter of 05/22/19 (from the past 48 hour(s))  Lactic acid, plasma     Status: None   Collection Time: 05/22/19  7:51 PM  Result Value Ref Range   Lactic Acid, Venous 1.3 0.5 - 1.9 mmol/L    Comment: Performed at [facility],  [place] [address] [place], [place] [address]  Comprehensive metabolic panel     Status: Abnormal   Collection Time: 05/22/19  7:51 PM  Result Value Ref Range   Sodium 135 135 - 145 mmol/L   Potassium 3.7 3.5 - 5.1 mmol/L   Chloride 97 (L) 98 - 111 mmol/L   CO2 27 22 - 32 mmol/L   Glucose, Bld 125 (H) 70 - 99 mg/dL   BUN 15 8 - 23 mg/dL   Creatinine, Ser 0.59 0.44 - 1.00 mg/dL   Calcium 8.4 (L) 8.9 - 10.3 mg/dL   Total Protein 5.4 (L) 6.5 - 8.1 g/dL   Albumin 2.7 (L) 3.5 - 5.0 g/dL   AST 30 15 - 41 U/L   ALT 26 0 - 44 U/L   Alkaline Phosphatase 199 (H) 38 - 126 U/L   Total Bilirubin 0.5 0.3 - 1.2 mg/dL   GFR calc non Af Amer >60 >60 mL/min   GFR calc Af Amer >60 >60 mL/min   Anion gap 11 5 - 15    Comment: Performed at [facility], [place] [address], [place], [place] [address]  CBC WITH DIFFERENTIAL     Status: Abnormal   Collection Time: 05/22/19  7:51 PM  Result Value Ref Range   WBC 0.8 (LL) 4.0 - 10.5 K/uL    Comment: WHITE COUNT CONFIRMED ON SMEAR THIS CRITICAL RESULT HAS VERIFIED AND BEEN CALLED TO J OXENDINE RN BY AMELIA NAVARRO ON 06 29 2020 AT 2105, AND HAS BEEN READ BACK.     RBC 3.59 (L) 3.87 - 5.11 MIL/uL   Hemoglobin 9.0 (L) 12.0 - 15.0 g/dL   HCT 28.3 (L) 36.0 - 46.0 %   MCV 78.8 (L) 80.0 - 100.0 fL   MCH 25.1 (L) 26.0 - 34.0 pg   MCHC 31.8 30.0 - 36.0 g/dL   RDW 21.2 (H) 11.5 - 15.5 %   Platelets 403 (H) 150 - 400 K/uL   nRBC 23.1 (H) 0.0 - 0.2 %   Neutrophils Relative % 38 %   Neutro Abs 0.3 (L) 1.7 - 7.7 K/uL   Lymphocytes Relative 41 %   Lymphs Abs 0.3 (L) 0.7 - 4.0 K/uL   Monocytes Relative 17 %   Monocytes Absolute 0.1 0.1 - 1.0 K/uL   Eosinophils Relative 0 %   Eosinophils Absolute 0.0 0.0 - 0.5 K/uL   Basophils Relative 1 %   Basophils Absolute 0.0 0.0 - 0.1 K/uL   RBC Morphology See Note     Comment:  NUCLEATED RED BLOOD CELLS ARE PRESENT   Immature Granulocytes 3 %   Abs Immature Granulocytes 0.02 0.00 - 0.07 K/uL   Ovalocytes  PRESENT     Comment: Performed at [facility], [place] [address], [place], [place] [address]  SARS Coronavirus 2 (CEPHEID- Performed in [place] hospital lab), Hosp Order     Status: None   Collection Time: 05/22/19  7:52 PM   Specimen: Nasopharyngeal Swab  Result Value Ref Range   SARS Coronavirus 2 NEGATIVE NEGATIVE    Comment: (NOTE) If result is NEGATIVE SARS-CoV-2 target nucleic acids are NOT DETECTED. The SARS-CoV-2 RNA is generally detectable in upper and lower  respiratory specimens during the acute phase of infection. The lowest  concentration of SARS-CoV-2 viral copies this assay can detect is 250  copies / mL. A negative result does not preclude SARS-CoV-2 infection  and should not be used as the sole basis for treatment or other  patient management decisions.  A negative result may occur with  improper specimen collection / handling, submission of specimen other  than nasopharyngeal swab, presence of viral mutation(s) within the  areas targeted by this assay, and inadequate number of viral copies  (<250 copies / mL). A negative result must be combined with clinical  observations, patient history, and epidemiological information. If result is POSITIVE SARS-CoV-2 target nucleic acids are DETECTED. The SARS-CoV-2 RNA is generally detectable in upper and lower  respiratory specimens dur ing the acute phase of infection.  Positive  results are indicative of active infection with SARS-CoV-2.  Clinical  correlation with patient history and other diagnostic information is  necessary to determine patient infection status.  Positive results do  not rule out bacterial infection or co-infection with other viruses. If result is PRESUMPTIVE POSTIVE SARS-CoV-2 nucleic acids MAY BE PRESENT.   A presumptive positive result was obtained on the submitted specimen  and confirmed on repeat testing.  While 2019 novel coronavirus  (SARS-CoV-2) nucleic acids may be present  in the submitted sample  additional confirmatory testing may be necessary for epidemiological  and / or clinical management purposes  to differentiate between  SARS-CoV-2 and other Sarbecovirus currently known to infect humans.  If clinically indicated additional testing with an alternate test  methodology [phone]) is advised. The SARS-CoV-2 RNA is generally  detectable in upper and lower respiratory sp ecimens during the acute  phase of infection. The expected result is Negative. Fact Sheet for Patients:  StrictlyIdeas.no Fact Sheet for Healthcare Providers: BankingDealers.co.za This test is not yet approved or cleared by the [place] FDA and has been authorized for detection and/or diagnosis of SARS-CoV-2 by FDA under an Emergency Use Authorization (EUA).  This EUA will remain in effect (meaning this test can be used) for the duration of the COVID-19 declaration under Section 564(b)(1) of the Act, 21 U.S.C. section 360bbb-3(b)(1), unless the authorization is terminated or revoked sooner. Performed at [facility], [place] [address] [place], [place] [address]   Urinalysis, Routine w reflex microscopic     Status: None   Collection Time: 05/22/19 10:16 PM  Result Value Ref Range   Color, Urine YELLOW YELLOW   APPearance CLEAR CLEAR   Specific Gravity, Urine 1.012 1.005 - 1.030   pH 7.0 5.0 - 8.0   Glucose, UA NEGATIVE NEGATIVE mg/dL   Hgb urine dipstick NEGATIVE NEGATIVE   Bilirubin Urine NEGATIVE NEGATIVE   Ketones, ur NEGATIVE NEGATIVE mg/dL   Protein, ur NEGATIVE NEGATIVE mg/dL  Nitrite NEGATIVE NEGATIVE   Leukocytes,Ua NEGATIVE NEGATIVE    Comment: Performed at [facility] [address], [place], [place] [address]   Dg Chest Port 1 View  Result Date: 05/22/2019 CLINICAL DATA:  78 y/o  F; sepsis. EXAM: PORTABLE CHEST 1 VIEW COMPARISON:  12/05/2018 CT of the chest. FINDINGS: Normal  cardiac silhouette. Aortic atherosclerosis with calcification. Right port catheter tip projects over lower SVC. Streaky opacity at the right lung base. No pleural effusion or pneumothorax. No acute osseous abnormality is evident. IMPRESSION: Streaky opacity at the right lung base may represent atelectasis or pneumonia. Electronically Signed   By: Kristine Garbe M.D.   On: 05/22/2019 20:45    Pending Labs Unresulted Labs (From admission, onward)    Start     Ordered   05/22/19 1951  Lactic acid, plasma  Now then every 2 hours,   STAT     05/22/19 1951   05/22/19 1951  Blood Culture (routine x 2)  BLOOD CULTURE X 2,   STAT     05/22/19 1951   Signed and Held  Urinalysis, Routine w reflex microscopic  Once,   R     Signed and Held   Signed and Held  Hemoglobin A1c  Tomorrow morning,   R    Comments: To assess prior glycemic control    Signed and Held   Signed and Held  Prealbumin  Tomorrow morning,   R     Signed and Held   Signed and Held  Sputum culture  (Non-severe pneumonia (non-ICU care) in adult without resistant organism risk factors.)  Once,   R     Signed and Held   Signed and Held  Lactic acid, plasma  STAT Now then every 3 hours,   STAT     Signed and Held   Signed and Held  Procalcitonin  ONCE - STAT,   R     Signed and Held   Signed and Held  Magnesium  Tomorrow morning,   R    Comments: Call MD if <1.5    Signed and Held   Signed and Held  Phosphorus  Tomorrow morning,   R     Signed and Held   Signed and Held  TSH  Once,   R    Comments: Cancel if already done within 1 month and notify MD    Signed and Held   Signed and Held  Comprehensive metabolic panel  Once,   R    Comments: Cal MD for K<3.5 or >5.0    Signed and Held   Signed and Held  CBC  Once,   R    Comments: Call for hg <8.0    Signed and Held          Vitals/Pain Today's Vitals   05/22/19 2030 05/22/19 2100 05/22/19 2130 05/22/19 2200  BP: 137/66 127/77 131/73 125/77  Pulse: (!) 107  (!) 106 (!) 103 94  Resp: (!) 21 19 (!) 22 19  Temp:      TempSrc:      SpO2: 98% 97% 98% 96%  Weight:      Height:      PainSc:        Isolation Precautions No active isolations  Medications Medications  0.9 %  sodium chloride infusion ( Intravenous New Bag/Given 05/22/19 2022)  ceFEPIme (MAXIPIME) 2 g in sodium chloride 0.9 % 100 mL IVPB (0 g Intravenous Stopped 05/22/19 2113)  metroNIDAZOLE (FLAGYL) IVPB 500  mg (0 mg Intravenous Stopped 05/22/19 2146)  vancomycin (VANCOCIN) IVPB 1000 mg/200 mL premix (0 mg Intravenous Stopped 05/22/19 2210)  acetaminophen (TYLENOL) tablet 650 mg (650 mg Oral Given 05/22/19 2117)    Mobility walks with person assist

## 2019-05-22 NOTE — H&P (Signed)
Tammy Hensley FTD:[id number] DOB: June 08, 1941 DOA: 05/22/2019     PCP: [name], MD   Outpatient Specialists:     Oncology  .Dr. Alvy Bimler GI  Dr.  Earlean Shawl    Patient arrived to ER on 05/22/19 at 1756  Patient coming from: home Lives  With family    Chief Complaint:  Chief Complaint  Patient presents with  . Weakness  . Fever    HPI: Tammy Hensley is a 78 y.o. 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  Anemia, DM 2, HTN, HLD, Hypothyroidism  Presented with generalized increased fatigue and fever started today recently had negative cover test.  EMS was called temperature up to 101.8 satting 98% room air blood pressure 112/86 Recent fall on Thursday 4 days ago hitting her face. Dr. Alvy Bimler instructed her to stop  Her diuretics and Maxide She is unsure if she still took it this AM  She feels very weak all over She has been feeling more confused   Has been admitted to the hospital from the 8th-10th of June for fever and weakness That time was diagnosed with neutropenic fever her last chemotherapy was in the beginning of June. No source was noted at that time. COVID testing was negative at a time. 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 He had palliative radiation therapy to the bony metastases currently undergoing chemotherapy with   DOCEtaxel (TAXOTERE)   Hyperlipidemia -  on statins   HTN on Maxzide     DM 2 -  Lab Results  Component Value Date   HGBA1C 6.8 (H) 05/02/2019   on  PO meds only,      Hypothyroidism:  Lab Results  Component Value Date   TSH 1.286 04/05/2019   on synthroid       While in ER: Lactic Acid WNL ANC 0.3 She was diagnosed with neutropenic fever CXR questionable PNA Started on broad spectrum abx cefepime, vanc, flagyl  The following Work up has been ordered so far:  Orders Placed This Encounter  Procedures  . Blood Culture (routine x 2)  . SARS Coronavirus 2 (CEPHEID- Performed in [place] hospital lab), [facility]  . DG Chest Port 1 View  . CT HEAD WO CONTRAST  . Lactic acid, plasma  . Comprehensive metabolic panel  . CBC WITH DIFFERENTIAL  . Urinalysis, Routine w reflex microscopic  . Diet NPO time specified  . Cardiac monitoring  . Refer to Sidebar Report: Sepsis Bundle ED/IP  . Document vital signs within 1-hour of fluid bolus completion and notify provider of bolus completion  . Document Actual / Estimated Weight  . Insert peripheral IV x 2  . Initiate Carrier Fluid Protocol  . Cardiac monitoring  . Call Code Sepsis ([facility] [phone]) Reason for Consult? tracking  . Consult to oncology  ALL PATIENTS BEING ADMITTED/HAVING PROCEDURES NEED COVID-19 SCREENING  . Consult to hospitalist  ALL PATIENTS BEING ADMITTED/HAVING PROCEDURES NEED COVID-19 SCREENING  . Nutritional services consult  . Pulse oximetry, continuous  . ED EKG 12-Lead  . Place  in observation (patient's expected length of stay will be less than 2 midnights)     Following Medications were ordered in ER: Medications  0.9 %  sodium chloride infusion ( Intravenous New Bag/Given 05/22/19 2022)  ceFEPIme (MAXIPIME) 2 g in sodium chloride 0.9 % 100 mL IVPB (0 g Intravenous Stopped 05/22/19 2113)  metroNIDAZOLE (FLAGYL) IVPB 500 mg (0 mg Intravenous Stopped 05/22/19 2146)  vancomycin (VANCOCIN) IVPB 1000 mg/200 mL premix (0 mg Intravenous Stopped 05/22/19 2210)  acetaminophen (TYLENOL) tablet 650 mg (650 mg Oral Given 05/22/19 2117)        Consult Orders  (From  admission, onward)         Start     Ordered   05/22/19 2307  Nutritional services consult  Once    Provider:  (Not yet assigned)  Question:  Reason for Consult?  Answer:  malnutrition   05/22/19 2307   05/22/19 2136  Consult to hospitalist  ALL PATIENTS BEING ADMITTED/HAVING PROCEDURES NEED COVID-19 SCREENING  Once    Comments: ALL PATIENTS BEING ADMITTED/HAVING PROCEDURES NEED COVID-19 SCREENING  Provider:  (Not yet assigned)  Question Answer Comment  Place call to: [facility] [phone] Fever Neutropenic   Reason for Consult Admit      05/22/19 2135          ER Provider Called:     Dr. Julien Nordmann They Recommend admit to medicine   Will see in AM    Significant initial  Findings: Abnormal Labs Reviewed  COMPREHENSIVE METABOLIC PANEL - Abnormal; Notable for the following components:      Result Value   Chloride 97 (*)    Glucose, Bld 125 (*)    Calcium 8.4 (*)    Total Protein 5.4 (*)    Albumin 2.7 (*)    Alkaline Phosphatase 199 (*)    All other components within normal limits  CBC WITH DIFFERENTIAL/PLATELET - Abnormal; Notable for the following components:   WBC 0.8 (*)    RBC 3.59 (*)    Hemoglobin 9.0 (*)    HCT 28.3 (*)    MCV 78.8 (*)    MCH 25.1 (*)    RDW 21.2 (*)    Platelets 403 (*)    nRBC 23.1 (*)    Neutro Abs 0.3 (*)    Lymphs Abs 0.3 (*)    All other components within normal limits   Otherwise labs showing:    Recent Labs  Lab 05/22/19 1951  NA 135  K 3.7  CO2 27  GLUCOSE 125*  BUN 15  CREATININE 0.59  CALCIUM 8.4*    Cr   Stable,  Lab Results  Component Value Date   CREATININE 0.59 05/22/2019   CREATININE 0.72 05/15/2019   CREATININE 0.76 05/08/2019    Recent Labs  Lab 05/22/19 1951  AST 30  ALT 26  ALKPHOS 199*  BILITOT 0.5  PROT 5.4*  ALBUMIN 2.7*   Lab Results  Component Value Date   CALCIUM 8.4 (L) 05/22/2019   PHOS 2.0 (L) 05/03/2019       WBC      Component Value Date/Time   WBC 0.8 (LL) 05/22/2019  1951   ANC    Component Value Date/Time   NEUTROABS 0.3 (L) 05/22/2019 1951   NEUTROABS 6.0 05/31/2017 0931    Plt: Lab Results  Component Value Date   PLT 403 (H) 05/22/2019     Lactic Acid, Venous    Component Value Date/Time   LATICACIDVEN 1.3 05/22/2019 1951  COVID-19 Labs    Lab Results  Component Value Date   SARSCOV2NAA NEGATIVE 05/22/2019   [place] NEGATIVE 05/01/2019      HG/HCT  stable,      Component Value Date/Time   HGB 9.0 (L) 05/22/2019 1951   HGB 9.6 (L) 05/15/2019 1011   HGB 12.9 05/31/2017 0931   HCT 28.3 (L) 05/22/2019 1951   HCT 38.7 05/31/2017 0931    DM  labs:  HbA1C: Recent Labs    07/05/18 1009 05/02/19 0337  HGBA1C 5.9* 6.8*      UA  No UTI   Urine analysis:    Component Value Date/Time   COLORURINE YELLOW 05/22/2019 2216   [place] 05/22/2019 2216   LABSPEC 1.012 05/22/2019 2216   PHURINE 7.0 05/22/2019 [place] 05/22/2019 2216   [place] 05/22/2019 2216   [place] 05/22/2019 2216   [place] 05/22/2019 2216   PROTEINUR NEGATIVE 05/22/2019 2216   UROBILINOGEN 0.2 10/30/2014 1402   NITRITE NEGATIVE 05/22/2019 [place] 05/22/2019 2216      CXR -streaky opacity in the right lung    ECG:  Personally reviewed by me showing: HR : 108  Rhythm:  Sinus tachycardia     no evidence of ischemic changes QTC 440      ED Triage Vitals  Enc Vitals Group     BP 05/22/19 1810 (!) 130/58     Pulse Rate 05/22/19 1810 (!) 104     Resp 05/22/19 1810 (!) 22     Temp 05/22/19 1810 (!) 101.9 F (38.8 C)     Temp Source 05/22/19 1810 Rectal     SpO2 05/22/19 1810 96 %     Weight 05/22/19 2021 145 lb (65.8 kg)     Height 05/22/19 2021 '5\' 2"'$  (1.575 m)     Head Circumference --      Peak Flow --      Pain Score 05/22/19 1811 0     Pain Loc --      Pain Edu? --      Excl. in Cylinder? --   TMAX(24)@       Latest  Blood pressure 131/73, pulse (!) 103,  temperature (!) 101.9 F (38.8 C), temperature source Rectal, resp. rate (!) 22, height '5\' 2"'$  (1.575 m), weight 65.8 kg, SpO2 98 %.     Hospitalist was called for admission for neutropenic fever with suspected possible pneumonia   Review of Systems:    Pertinent positives include: Fevers, chills, fatigue, weight loss   Constitutional:  No weight loss, night sweats,  HEENT:  No headaches, Difficulty swallowing,Tooth/dental problems,Sore throat,  No sneezing, itching, ear ache, nasal congestion, post nasal drip,  Cardio-vascular:  No chest pain, Orthopnea, PND, anasarca, dizziness, palpitations.no Bilateral lower extremity swelling  GI:  No heartburn, indigestion, abdominal pain, nausea, vomiting, diarrhea, change in bowel habits, loss of appetite, melena, blood in stool, hematemesis Resp:  no shortness of breath at rest. No dyspnea on exertion, No excess mucus, no productive cough, No non-productive cough, No coughing up of blood.No change in color of mucus.No wheezing. Skin:  no rash or lesions. No jaundice GU:  no dysuria, change in color of urine, no urgency or frequency. No straining to urinate.  No flank pain.  Musculoskeletal:  No joint pain or no joint swelling. No decreased range of motion. No back pain.  Psych:  No change in mood or affect. No depression or anxiety. No memory loss.  Neuro: no localizing neurological complaints, no tingling, no weakness, no double vision, no gait abnormality, no slurred speech, no confusion  All systems reviewed and apart from [place] all are negative  Past Medical History:   Past Medical History:  Diagnosis Date  . Avascular necrosis of bones of both hips ([place]) 11/17/2016  . Breast cancer ([place]) 2006   left breast  . Depression   . Diabetes ([place])    borderline  . Endometrial ca ([place]) 2018  . Family history of cancer   . History of brachytherapy 03/29/17-04/12/17   vaginal cuff 18 Gy in 3 fractions  . History of radiation therapy  02/10/17-03/16/17   pelvis 45 Gy in 25 fractions  . Hyperlipidemia   . Hypertension   . Iron deficiency anemia   . 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 Vitamin D deficiency       Past Surgical History:  Procedure Laterality Date  . BREAST LUMPECTOMY Left 2006  . BREAST SURGERY  2006   left breast lumpectomy- radiation, took Femara   . IR IMAGING GUIDED PORT INSERTION  09/29/2018  . 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 ROBOTIC ASSISTED LAP VAGINAL HYSTERECTOMY N/A 10/13/2016   Procedure: XI ROBOTIC ASSISTED LAPAROSCOPIC VAGINAL HYSTERECTOMY;  Surgeon: Nancy Marus, MD;  Location: [facility];  Service: Gynecology;  Laterality: N/A;  . ROBOTIC ASSISTED SALPINGO OOPHERECTOMY Bilateral 10/13/2016   Procedure: XI ROBOTIC ASSISTED SALPINGO OOPHORECTOMY;  Surgeon: Nancy Marus, MD;  Location: [facility];  Service: Gynecology;  Laterality: Bilateral;  . SENTINEL NODE BIOPSY N/A 10/13/2016   Procedure: SENTINEL NODE BIOPSY;  Surgeon: Nancy Marus, MD;  Location: [facility];  Service: Gynecology;  Laterality: N/A;    Social History:  Ambulatory   walker      reports that she has never smoked. She has never used smokeless tobacco. She reports that she does not drink alcohol or use drugs.     Family History:   Family History  Problem Relation Age of Onset  . Heart disease Mother   . Heart attack Mother   . Cancer Father   . Heart attack Father   . Prostate cancer Father 22       deceased 44  . Breast cancer Paternal Aunt 67       deceased 93s  . Prostate cancer Paternal Uncle 15       deceased 19  . Cancer Paternal Grandmother        unsure; deceased 59s  . Prostate cancer Brother 45       currently 65  . Prostate cancer Brother 18       currently 40    Allergies: Allergies  Allergen Reactions  . Omnipaque [Iohexol] Hives    Pt will need 13 hr prep in the future--SLG  . Calan [Verapamil]      Constipation   . Effexor [Venlafaxine]     unknown  . Erythromycin Nausea And Vomiting  . Femara [Letrozole] Swelling  . Fosamax [Alendronate]     aching  . Ibuprofen     Dizziness, incoherent   . Paxil [Paroxetine Hcl] Nausea Only  . Remeron [Mirtazapine]     Weight gain   . Vasotec [Enalapril] Cough     Prior to Admission medications   Medication Sig Start Date End Date Taking? Authorizing Provider  aspirin 81 MG tablet Take 81 mg by mouth  daily.     Yes [provider]  Calcium Carbonate-Vitamin D (CALCIUM 600 + D PO) Take 1 tablet by mouth 2 (two) times daily.    Yes [provider]  Cholecalciferol (VITAMIN D) 2000 units CAPS Take 2,000 Units by mouth every other day.   Yes [provider]  dexamethasone (DECADRON) 4 MG tablet Take 2 mg every 2 days Patient taking differently: Take 2 mg by mouth daily.  05/15/19  Yes [name], MD  furosemide (LASIX) 20 MG tablet Once daily for 3 days, repeat as needed for lower extremity edema Patient taking differently: Take 20 mg by mouth daily as needed for fluid or edema.  10/18/18  Yes Tanner, Lyndon Code., PA-C  levothyroxine (SYNTHROID, LEVOTHROID) 88 MCG tablet Take 1 tablet (88 mcg total) by mouth daily before breakfast. Patient taking differently: Take 44-88 mcg by mouth as directed. Take 1/2 tablet (44 mcg) on Mon, Wed, Fri, and Sat and Take 1 tablet (88 mcg) all other days 05/04/17  Yes [name], MD  magic mouthwash w/lidocaine SOLN Swish 56m by mouth and swallow, three times a day as needed for mouth pain. 05/11/19  Yes McClanahan, KDanton Sewer NP  metFORMIN (GLUCOPHAGE-XR) 500 MG 24 hr tablet Take 1 tablet (500 mg total) by mouth 2 (two) times daily. 12/06/18  Yes GHeath Lark MD  Multiple Vitamin (MULTIVITAMIN WITH MINERALS) TABS tablet Take 1 tablet by mouth daily.   Yes [provider]  pravastatin (PRAVACHOL) 40 MG tablet Take 1 tablet (40 mg total) by mouth every evening. 03/02/18  Yes MUnk Pinto MD  traMADol (ULTRAM) 50 MG tablet Take 1 tablet (50 mg total) by mouth 3 (three) times daily. Take 1/2 to 1 tablet every 4 hours as needed for severe Back Pain Patient taking differently: Take 25 mg by mouth every 4 (four) hours as needed for moderate pain or severe pain. Take 1/2 to 1 tablet every 4 hours as needed for severe Back Pain 02/20/19  Yes Gorsuch, Ni, MD  triamterene-hydrochlorothiazide (MAXZIDE) 75-50 MG tablet Take 0.5 tablets by mouth daily.  01/14/19  Yes [provider]  ondansetron (ZOFRAN) 8 MG tablet Take 1 tablet (8 mg total) by mouth every 8 (eight) hours as needed for refractory nausea / vomiting. Start on day 3 after chemo. Patient not taking: Reported on 05/22/2019 09/15/18   GHeath Lark MD  pantoprazole (PROTONIX) 40 MG tablet Take 1 tablet (40 mg total) by mouth 2 (two) times daily for 30 days. Patient not taking: Reported on 05/22/2019 05/03/19 06/02/19  EAlma Friendly MD  prochlorperazine (COMPAZINE) 10 MG tablet Take 1 tablet (10 mg total) by mouth every 6 (six) hours as needed (Nausea or vomiting). Patient not taking: Reported on 05/22/2019 09/15/18   GHeath Lark MD  simethicone (MYLICON) 40 MVO/1.6WVdrops Take 0.3 mLs (20 mg total) by mouth 4 (four) times daily as needed for flatulence. Patient not taking: Reported on 05/09/2019 05/03/19   EAlma Friendly MD   Physical Exam: Blood pressure 131/73, pulse (!) 103, temperature (!) 101.9 F (38.8 C), temperature source Rectal, resp. rate (!) 22, height '5\' 2"'$  (1.575 m), weight 65.8 kg, SpO2 98 %. 1. General:  in No   Acute distress   Chronically ill  Cachectic  -appearing 2. Psychological: Alert and   Oriented 3. Head/ENT:     Dry Mucous Membranes  Head facial bruising, neck supple                          Poor Dentition 4. SKIN:  decreased Skin turgor,  Skin clean Dry and intact no rash 5. Heart: Regular rate and rhythm no  Murmur, no Rub or gallop 6. Lungs:  Clear  to auscultation bilaterally, no wheezes or crackles   7. Abdomen: Soft non-tender, Non distended   Obese bowel sounds present 8. Lower extremities: no clubbing, cyanosis,bilateral edema 9. Neurologically Grossly intact, moving all 4 extremities equally   10. MSK: Normal range of motion   All other LABS:     Recent Labs  Lab 05/22/19 1951  WBC 0.8*  NEUTROABS 0.3*  HGB 9.0*  HCT 28.3*  MCV 78.8*  PLT 403*     Recent Labs  Lab 05/22/19 1951  NA 135  K 3.7  CL 97*  CO2 27  GLUCOSE 125*  BUN 15  CREATININE 0.59  CALCIUM 8.4*     Recent Labs  Lab 05/22/19 1951  AST 30  ALT 26  ALKPHOS 199*  BILITOT 0.5  PROT 5.4*  ALBUMIN 2.7*       Cultures:    Component Value Date/Time   SDES  05/02/2019 1253    BLOOD PORTA CATH Performed at [facility], [place] [address], [place], [place] [address]    [place]  05/02/2019 1253    BOTTLES DRAWN AEROBIC AND ANAEROBIC Blood Culture adequate volume Performed at [place] [address], [place], [place] [address]    CULT  05/02/2019 1253    NO GROWTH 5 DAYS Performed at [place] [address] [place],  [address]    REPTSTATUS 05/07/2019 FINAL 05/02/2019 1253     Radiological Exams on Admission: Dg Chest Port 1 View  Result Date: 05/22/2019 CLINICAL DATA:  78 y/o  F; sepsis. EXAM: PORTABLE CHEST 1 VIEW COMPARISON:  12/05/2018 CT of the chest. FINDINGS: Normal cardiac silhouette. Aortic atherosclerosis with calcification. Right port catheter tip projects over lower SVC. Streaky opacity at the right lung base. No pleural effusion or pneumothorax. No acute osseous abnormality is evident. IMPRESSION: Streaky opacity at the right lung base may represent atelectasis or pneumonia. Electronically Signed   By: Kristine Garbe M.D.   On: 05/22/2019 20:45    Chart has been reviewed    Assessment/Plan   78 y.o. 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  Anemia, DM 2, HTN, HLD, Hypothyroidism  Admitted for neutropenic fever  Present on Admission: . HCAP (healthcare-associated pneumonia) -continue broad-spectrum antibiotics possible infiltrate could be a source of fever.  COVID negative. . Neutropenic fever (HCC)-continue broad-spectrum antibiotics cefepime and vancomycin added azithromycin given possible pneumonia.  Await results of sputum and blood cultures.  . Hypothyroidism - - Check TSH continue home medications at current dose  . Hypertension hold home medications given soft blood pressures in the emergency department and dehydration . Hyperlipidemia chronic stable continue home medications . Endometrial cancer (HCC)-metastatic.  Patient states last chemotherapy was sometime last week.  Although I do not see this in epic.  ER provider discussed with oncology also email Dr. Alvy Bimler admission . Cancer associated pain -provide pain management as needed . Fluid retention in legs -felt to be likely secondary to steroids will hold Lasix given soft blood pressures and probable dehydration . Anemia, chronic disease currently at baseline  continue to monitor  . Sepsis ([place]) - -SIRS criteria met with neutropenia,  tachycardia ,  fever.    without evidence of end organ damage  -Most likely source being , pulmonary   - Obtain serial lactic acid and procalcitonin level.  - Initiate IV antibiotics   - await results of blood and urine culture  - Rehydrate aggressively  fall - resulting in extensive facial bruising shaded worsening confusion obtain CT of the head  Other plan as per orders.  DVT prophylaxis:  SCD   Code Status:    DNR/DNI  as per patient  I had personally discussed CODE STATUS with patient    Family Communication:   Family not at  Bedside    Disposition Plan:     To home once workup is complete and patient is stable                    Would benefit from PT/OT eval prior to  DC  Ordered                     Nutrition    consulted                                     Consults called: oncology aware    Admission status:  ED Disposition    ED Disposition Condition [place]: [place] [[id number]]  Level of Care: Telemetry [5]  Admit to tele based on following criteria: Other see comments  Comments: tachycardia  Covid Evaluation: Confirmed COVID Negative  Diagnosis: Neutropenic fever ([place]) [692493]  Admitting Physician: Toy Baker [[id number]]  Attending Physician: Toy Baker [[id number]]  PT Class (Do Not Modify): Observation [104]  PT Acc Code (Do Not Modify): Observation [10022]        Obs    Level of care   tele  For   24H     Precautions: NONE  No active isolations  PPE: Used by the provider:   P100  eye Goggles,  Gloves      Elonzo Sopp [place] 05/22/2019, 11:26 PM    [facility]     after 2 AM please page floor coverage PA If 7AM-7PM, please contact the day team taking care of the patient using [email or web address]

## 2019-05-22 NOTE — ED Provider Notes (Signed)
[facility] DEPT Provider Note   CSN: [id number] Arrival date & time: 05/22/19  1756    History   Chief Complaint Chief Complaint  Patient presents with   Weakness   Fever    HPI Tammy Hensley is a 78 y.o. female.     Patient brought in by EMS from home.  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  She had chemotherapy on June 22.  She earlier in June she had a negative COVID test.  Her oncologist is Dr. Simeon Craft 6.  Today as she felt increased weakness and had a fever.     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   Breast cancer ([place]) 2006   left breast   Depression    Diabetes ([place])    borderline   Endometrial ca ([place]) 2018   Family history of cancer    History of brachytherapy 03/29/17-04/12/17   vaginal cuff 18 Gy in 3 fractions   History of radiation therapy 02/10/17-03/16/17   pelvis 45 Gy in 25 fractions   Hyperlipidemia    Hypertension    Iron deficiency anemia    Metastatic 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 ([place]) 2019   left hip   Vitamin D deficiency     Patient Active Problem List   Diagnosis Date Noted   Neutropenia ([place]) 05/01/2019   Hypokalemia 05/01/2019   Anemia, chronic disease 04/07/2019   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([place]) 02/16/2019   Anemia due to antineoplastic chemotherapy 01/17/2019   Fluid retention in legs 12/06/2018   Pancytopenia, acquired ([place]) 12/06/2018   Goals of care, counseling/discussion 09/16/2018   Metastasis to bone ([place]) 08/30/2018   Metastasis to lung ([place]) 08/30/2018   Cancer associated pain 08/30/2018   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   Other constipation 08/30/2018   Preventive measure 08/30/2018   Physical debility 08/30/2018   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   Insomnia 09/07/2017   T2_NIDDM 01/06/2017   Genetic testing 01/01/2017   Family history of  cancer    FIGO stage II endometrial cancer ([place]) 11/17/2016   Atherosclerosis of abdominal aorta ([place]) 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   Endometrial cancer ([place]) 09/30/2016   Osteopenia 11/22/2015   BMI 25.0-25.9,adult 08/24/2015   Medication management 01/03/2014   Breast cancer, left breast ([place]) 12/19/2013   Hypothyroidism    Hypertension    Hyperlipidemia    Vitamin D deficiency    Iron deficiency anemia    Malignant neoplasm of upper-outer quadrant of left breast in female, 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    Past Surgical History:  Procedure Laterality Date   BREAST LUMPECTOMY Left 2006   BREAST SURGERY  2006   left breast lumpectomy- radiation, took Femara    IR IMAGING GUIDED PORT INSERTION  09/29/2018   perineum  1973   correction  of vaginal and rectal area from birth -did not have episiotomy from first birth   ROBOTIC ASSISTED LAP VAGINAL HYSTERECTOMY N/A 10/13/2016   Procedure: XI ROBOTIC [place];  Surgeon: Nancy Marus, MD;  Location: [facility];  Service: Gynecology;  Laterality: N/A;   ROBOTIC ASSISTED SALPINGO OOPHERECTOMY Bilateral 10/13/2016   Procedure: XI ROBOTIC ASSISTED SALPINGO OOPHORECTOMY;  Surgeon: Nancy Marus, MD;  Location: [facility];  Service: Gynecology;  Laterality: Bilateral;   SENTINEL NODE BIOPSY N/A 10/13/2016   Procedure: SENTINEL NODE BIOPSY;  Surgeon: Nancy Marus, MD;  Location: [facility];  Service: Gynecology;  Laterality: N/A;     OB History    Gravida  4   Para  2   Term  2   Preterm      AB  2   Living  2     SAB  2   TAB      Ectopic      Multiple      Live Births  2        Obstetric Comments  SVDx2         Home Medications    Prior to Admission medications   Medication Sig Start Date End Date Taking? Authorizing Provider  aspirin 81 MG tablet Take 81 mg by mouth daily.     Yes [provider]  Calcium Carbonate-Vitamin D (CALCIUM 600 + D PO) Take 1 tablet by  mouth 2 (two) times daily.    Yes [provider]  Cholecalciferol (VITAMIN D) 2000 units CAPS Take 2,000 Units by mouth every other day.   Yes [provider]  dexamethasone (DECADRON) 4 MG tablet Take 2 mg every 2 days Patient taking differently: Take 2 mg by mouth daily.  05/15/19  Yes [name], MD  furosemide (LASIX) 20 MG tablet Once daily for 3 days, repeat as needed for lower extremity edema Patient taking differently: Take 20 mg by mouth daily as needed for fluid or edema.  10/18/18  Yes Tanner, Lyndon Code., PA-C  levothyroxine (SYNTHROID, LEVOTHROID) 88 MCG tablet Take 1 tablet (88 mcg total) by mouth daily before breakfast. Patient taking differently: Take 44-88 mcg by mouth as directed. Take 1/2 tablet (44 mcg) on Mon, Wed, Fri, and Sat and Take 1 tablet (88 mcg) all other days 05/04/17  Yes [name], MD  magic mouthwash w/lidocaine SOLN Swish 63ml by mouth and swallow, three times a day as needed for mouth pain. 05/11/19  Yes McClanahan, [place] Sewer, NP  metFORMIN (GLUCOPHAGE-XR) 500 MG 24 hr tablet Take 1 tablet (500 mg total) by mouth 2 (two) times daily. 12/06/18  Yes [name], MD  Multiple Vitamin (MULTIVITAMIN WITH MINERALS) TABS tablet Take 1 tablet by mouth daily.   Yes [provider]  pravastatin (PRAVACHOL) 40 MG tablet Take 1 tablet (40 mg total) by mouth every evening. 03/02/18  Yes [name], MD  traMADol (ULTRAM) 50 MG tablet Take 1 tablet (50 mg total) by mouth 3 (three) times daily. Take 1/2 to 1 tablet every 4 hours as needed for severe Back Pain Patient taking differently: Take 25 mg by mouth every 4 (four) hours as needed for moderate pain or severe pain. Take 1/2 to 1 tablet every 4 hours as needed for severe Back Pain 02/20/19  Yes Gorsuch, Ni, MD  triamterene-hydrochlorothiazide (MAXZIDE) 75-50 MG tablet Take 0.5 tablets by mouth daily.  01/14/19  Yes [provider]  ondansetron (ZOFRAN) 8 MG tablet Take 1 tablet (8 mg total) by  mouth every 8 (eight) hours as needed for refractory nausea / vomiting. Start on day 3 after chemo. Patient not taking: Reported on 05/22/2019 09/15/18   [name], MD  pantoprazole (PROTONIX) 40 MG tablet Take 1 tablet (40 mg total) by mouth 2 (two) times daily for 30 days. Patient not taking: Reported on 05/22/2019 05/03/19 06/02/19  Alma Friendly, MD  prochlorperazine (COMPAZINE) 10 MG tablet Take 1 tablet (10 mg total) by mouth every 6 (six) hours as needed (Nausea or vomiting). Patient not taking: Reported on 05/22/2019 09/15/18   [name], MD  simethicone (MYLICON) 40 KV/4.2VZ drops Take 0.3 mLs (20 mg total) by mouth 4 (four) times daily as needed for flatulence. Patient not taking: Reported on 05/09/2019 05/03/19   Alma Friendly, MD    Family History Family History  Problem Relation Age of Onset   Heart disease Mother    Heart attack Mother    Cancer Father    Heart attack Father    Prostate cancer Father 28       deceased 35   Breast cancer Paternal Aunt 62       deceased 52s   Prostate cancer Paternal Uncle 55       deceased 8   Cancer Paternal Grandmother        unsure; deceased 35s   Prostate cancer Brother 42       currently 64   Prostate cancer Brother 55       currently 24    Social History Social History   Tobacco Use   Smoking status: Never Smoker   Smokeless tobacco: Never Used  Substance Use Topics   Alcohol use: No   Drug use: No     Allergies   Omnipaque [iohexol], Calan [verapamil], Effexor [venlafaxine], Erythromycin, Femara [letrozole], Fosamax [alendronate], Ibuprofen, Paxil [paroxetine hcl], Remeron [mirtazapine], and Vasotec [enalapril]   Review of Systems Review of Systems  Constitutional: Positive for fatigue and fever. Negative for chills.  HENT: Negative for congestion, rhinorrhea and sore throat.   Eyes: Negative for visual disturbance.  Respiratory: Negative for cough and shortness of breath.     Cardiovascular: Negative for chest pain and leg swelling.  Gastrointestinal: Negative for abdominal pain, diarrhea, nausea and vomiting.  Genitourinary: Negative for dysuria.  Musculoskeletal: Negative for back pain and neck pain.  Skin: Negative for rash.  Neurological: Positive for weakness. Negative for dizziness, light-headedness and headaches.  Hematological: Does not bruise/bleed easily.  Psychiatric/Behavioral: Negative for confusion.     Physical Exam Updated Vital Signs BP 125/77    Pulse 94    Temp (!) 101.9 F (38.8 C) (Rectal)    Resp 19    Ht 1.575 m (5\' 2" )    Wt 65.8 kg    SpO2 96%    BMI 26.52 kg/m   Physical Exam Vitals signs and nursing note reviewed.  Constitutional:      General: She is not in acute distress.    Appearance: Normal appearance. She is well-developed.  HENT:     Head: Normocephalic and atraumatic.  Eyes:     Extraocular Movements: Extraocular movements intact.     Conjunctiva/sclera: Conjunctivae normal.     Pupils: Pupils are equal, round, and reactive to light.  Neck:     Musculoskeletal: Normal range of motion and neck supple.  Cardiovascular:     Rate and Rhythm: Normal rate and regular rhythm.     Heart sounds: No murmur.  Pulmonary:     Effort: Pulmonary effort is normal. No respiratory distress.     Breath sounds: Normal breath sounds.  Abdominal:     Palpations: Abdomen is soft.     Tenderness: There is no abdominal tenderness.  Musculoskeletal: Normal range of motion.  Skin:    General: Skin is warm and dry.  Neurological:     General: No focal deficit present.     Mental Status: She is alert and oriented to person, place, and time.      ED Treatments / Results  Labs (all labs ordered are listed, but only abnormal results  are displayed) Labs Reviewed  COMPREHENSIVE METABOLIC PANEL - Abnormal; Notable for the following components:      Result Value   Chloride 97 (*)    Glucose, Bld 125 (*)    Calcium 8.4 (*)     Total Protein 5.4 (*)    Albumin 2.7 (*)    Alkaline Phosphatase 199 (*)    All other components within normal limits  CBC WITH DIFFERENTIAL/PLATELET - Abnormal; Notable for the following components:   WBC 0.8 (*)    RBC 3.59 (*)    Hemoglobin 9.0 (*)    HCT 28.3 (*)    MCV 78.8 (*)    MCH 25.1 (*)    RDW 21.2 (*)    Platelets 403 (*)    nRBC 23.1 (*)    Neutro Abs 0.3 (*)    Lymphs Abs 0.3 (*)    All other components within normal limits  SARS CORONAVIRUS 2 (HOSPITAL ORDER, [facility])  CULTURE, BLOOD (ROUTINE X 2)  CULTURE, BLOOD (ROUTINE X 2)  LACTIC ACID, PLASMA  URINALYSIS, ROUTINE W REFLEX MICROSCOPIC  LACTIC ACID, PLASMA    EKG None  Radiology Dg Chest Port 1 View  Result Date: 05/22/2019 CLINICAL DATA:  78 y/o  F; sepsis. EXAM: PORTABLE CHEST 1 VIEW COMPARISON:  12/05/2018 CT of the chest. FINDINGS: Normal cardiac silhouette. Aortic atherosclerosis with calcification. Right port catheter tip projects over lower SVC. Streaky opacity at the right lung base. No pleural effusion or pneumothorax. No acute osseous abnormality is evident. IMPRESSION: Streaky opacity at the right lung base may represent atelectasis or pneumonia. Electronically Signed   By: Kristine Garbe M.D.   On: 05/22/2019 20:45    Procedures Procedures (including critical care time)  CRITICAL CARE Performed by: Fredia Sorrow Total critical care time: 30  minutes Critical care time was exclusive of separately billable procedures and treating other patients. Critical care was necessary to treat or prevent imminent or life-threatening deterioration. Critical care was time spent personally by me on the following activities: development of treatment plan with patient and/or surrogate as well as nursing, discussions with consultants, evaluation of patient's response to treatment, examination of patient, obtaining history from patient or surrogate, ordering and performing  treatments and interventions, ordering and review of laboratory studies, ordering and review of radiographic studies, pulse oximetry and re-evaluation of patient's condition.   Medications Ordered in ED Medications  0.9 %  sodium chloride infusion ( Intravenous New Bag/Given 05/22/19 2022)  ceFEPIme (MAXIPIME) 2 g in sodium chloride 0.9 % 100 mL IVPB (0 g Intravenous Stopped 05/22/19 2113)  metroNIDAZOLE (FLAGYL) IVPB 500 mg (0 mg Intravenous Stopped 05/22/19 2146)  vancomycin (VANCOCIN) IVPB 1000 mg/200 mL premix (0 mg Intravenous Stopped 05/22/19 2210)  acetaminophen (TYLENOL) tablet 650 mg (650 mg Oral Given 05/22/19 2117)     Initial Impression / Assessment and Plan / ED Course  I have reviewed the triage vital signs and the nursing notes.  Pertinent labs & imaging results that were available during my care of the patient were reviewed by me and considered in my medical decision making (see chart for details).       Patient with fever.  Most likely neutropenic fever based on her white blood cell count.  Chest x-ray without any distinct pneumonia but something could be developing.  COVID testing is pending.  Patient based on her presentation with fever tachycardia and increased respiratory rate was started on the sepsis protocol.  She never  was hypotensive.  She did not really need the 30 cc/kg fluid challenge.  Also her lactic acid was less than 2.  Discussed with Dr. Earlie Server on for heme-onc.  She is followed by Dr. Simeon Craft such.  The hospitalist will admit.  No distinct source of infection at this time.  Final Clinical Impressions(s) / ED Diagnoses   Final diagnoses:  Fever, unspecified fever cause  Chemotherapy-induced neutropenia [facility])    ED Discharge Orders    None       Fredia Sorrow, MD 05/22/19 2233

## 2019-05-23 ENCOUNTER — Telehealth: Payer: Self-pay | Admitting: Oncology

## 2019-05-23 ENCOUNTER — Inpatient Hospital Stay: Payer: Medicare Other

## 2019-05-23 ENCOUNTER — Inpatient Hospital Stay: Payer: Medicare Other | Admitting: Hematology and Oncology

## 2019-05-23 DIAGNOSIS — G9341 Metabolic encephalopathy: Secondary | ICD-10-CM | POA: Diagnosis present

## 2019-05-23 DIAGNOSIS — S0083XA Contusion of other part of head, initial encounter: Secondary | ICD-10-CM | POA: Diagnosis present

## 2019-05-23 DIAGNOSIS — E876 Hypokalemia: Secondary | ICD-10-CM | POA: Diagnosis not present

## 2019-05-23 DIAGNOSIS — R509 Fever, unspecified: Secondary | ICD-10-CM | POA: Diagnosis not present

## 2019-05-23 DIAGNOSIS — D703 Neutropenia due to infection: Secondary | ICD-10-CM | POA: Diagnosis present

## 2019-05-23 DIAGNOSIS — C787 Secondary malignant neoplasm of liver and intrahepatic bile duct: Secondary | ICD-10-CM | POA: Diagnosis present

## 2019-05-23 DIAGNOSIS — C7802 Secondary malignant neoplasm of left lung: Secondary | ICD-10-CM | POA: Diagnosis present

## 2019-05-23 DIAGNOSIS — E440 Moderate protein-calorie malnutrition: Secondary | ICD-10-CM | POA: Diagnosis present

## 2019-05-23 DIAGNOSIS — Y92009 Unspecified place in unspecified non-institutional (private) residence as the place of occurrence of the external cause: Secondary | ICD-10-CM | POA: Diagnosis not present

## 2019-05-23 DIAGNOSIS — E119 Type 2 diabetes mellitus without complications: Secondary | ICD-10-CM | POA: Diagnosis present

## 2019-05-23 DIAGNOSIS — E860 Dehydration: Secondary | ICD-10-CM | POA: Diagnosis present

## 2019-05-23 DIAGNOSIS — G893 Neoplasm related pain (acute) (chronic): Secondary | ICD-10-CM | POA: Diagnosis present

## 2019-05-23 DIAGNOSIS — R64 Cachexia: Secondary | ICD-10-CM | POA: Diagnosis present

## 2019-05-23 DIAGNOSIS — C7801 Secondary malignant neoplasm of right lung: Secondary | ICD-10-CM | POA: Diagnosis present

## 2019-05-23 DIAGNOSIS — D709 Neutropenia, unspecified: Secondary | ICD-10-CM | POA: Diagnosis not present

## 2019-05-23 DIAGNOSIS — Z66 Do not resuscitate: Secondary | ICD-10-CM | POA: Diagnosis present

## 2019-05-23 DIAGNOSIS — E785 Hyperlipidemia, unspecified: Secondary | ICD-10-CM | POA: Diagnosis present

## 2019-05-23 DIAGNOSIS — W1830XA Fall on same level, unspecified, initial encounter: Secondary | ICD-10-CM | POA: Diagnosis present

## 2019-05-23 DIAGNOSIS — Z20828 Contact with and (suspected) exposure to other viral communicable diseases: Secondary | ICD-10-CM | POA: Diagnosis present

## 2019-05-23 DIAGNOSIS — D638 Anemia in other chronic diseases classified elsewhere: Secondary | ICD-10-CM | POA: Diagnosis not present

## 2019-05-23 DIAGNOSIS — D6481 Anemia due to antineoplastic chemotherapy: Secondary | ICD-10-CM | POA: Diagnosis present

## 2019-05-23 DIAGNOSIS — D701 Agranulocytosis secondary to cancer chemotherapy: Secondary | ICD-10-CM | POA: Diagnosis present

## 2019-05-23 DIAGNOSIS — A419 Sepsis, unspecified organism: Secondary | ICD-10-CM | POA: Diagnosis present

## 2019-05-23 DIAGNOSIS — E039 Hypothyroidism, unspecified: Secondary | ICD-10-CM | POA: Diagnosis present

## 2019-05-23 DIAGNOSIS — D630 Anemia in neoplastic disease: Secondary | ICD-10-CM | POA: Diagnosis present

## 2019-05-23 DIAGNOSIS — T451X5A Adverse effect of antineoplastic and immunosuppressive drugs, initial encounter: Secondary | ICD-10-CM | POA: Diagnosis present

## 2019-05-23 DIAGNOSIS — R531 Weakness: Secondary | ICD-10-CM | POA: Diagnosis present

## 2019-05-23 DIAGNOSIS — C7951 Secondary malignant neoplasm of bone: Secondary | ICD-10-CM | POA: Diagnosis present

## 2019-05-23 DIAGNOSIS — C541 Malignant neoplasm of endometrium: Secondary | ICD-10-CM | POA: Diagnosis present

## 2019-05-23 LAB — COMPREHENSIVE METABOLIC PANEL
ALT: 24 U/L (ref 0–44)
AST: 33 U/L (ref 15–41)
Albumin: 2.7 g/dL — ABNORMAL LOW (ref 3.5–5.0)
Alkaline Phosphatase: 180 U/L — ABNORMAL HIGH (ref 38–126)
Anion gap: 9 (ref 5–15)
BUN: 12 mg/dL (ref 8–23)
CO2: 27 mmol/L (ref 22–32)
Calcium: 8.4 mg/dL — ABNORMAL LOW (ref 8.9–10.3)
Chloride: 103 mmol/L (ref 98–111)
Creatinine, Ser: 0.48 mg/dL (ref 0.44–1.00)
GFR calc Af Amer: >60 mL/min (ref >60)
GFR calc non Af Amer: >60 mL/min (ref >60)
Glucose, Bld: 125 mg/dL — ABNORMAL HIGH (ref 70–99)
Potassium: 3.5 mmol/L (ref 3.5–5.1)
Sodium: 139 mmol/L (ref 135–145)
Total Bilirubin: 0.9 mg/dL (ref 0.3–1.2)
Total Protein: 5.4 g/dL — ABNORMAL LOW (ref 6.5–8.1)

## 2019-05-23 LAB — MAGNESIUM: Magnesium: 1.8 mg/dL (ref 1.7–2.4)

## 2019-05-23 LAB — HEMOGLOBIN A1C
Hgb A1c MFr Bld: 7.4 % — ABNORMAL HIGH (ref 4.8–5.6)
Mean Plasma Glucose: 165.68 mg/dL

## 2019-05-23 LAB — CBC
HCT: 30.1 % — ABNORMAL LOW (ref 36.0–46.0)
Hemoglobin: 9.1 g/dL — ABNORMAL LOW (ref 12.0–15.0)
MCH: 24.3 pg — ABNORMAL LOW (ref 26.0–34.0)
MCHC: 30.2 g/dL (ref 30.0–36.0)
MCV: 80.5 fL (ref 80.0–100.0)
Platelets: 345 10*3/uL (ref 150–400)
RBC: 3.74 MIL/uL — ABNORMAL LOW (ref 3.87–5.11)
RDW: 21.2 % — ABNORMAL HIGH (ref 11.5–15.5)
WBC: 0.6 10*3/uL — CL (ref 4.0–10.5)
nRBC: 14.5 % — ABNORMAL HIGH (ref 0.0–0.2)

## 2019-05-23 LAB — LACTIC ACID, PLASMA
Lactic Acid, Venous: 1.4 mmol/L (ref 0.5–1.9)
Lactic Acid, Venous: 3.2 mmol/L (ref 0.5–1.9)
Lactic Acid, Venous: 1.6 mmol/L (ref 0.5–1.9)

## 2019-05-23 LAB — MRSA PCR SCREENING: MRSA by PCR: NEGATIVE

## 2019-05-23 LAB — GLUCOSE, CAPILLARY
Glucose-Capillary: 103 mg/dL — ABNORMAL HIGH (ref 70–99)
Glucose-Capillary: 119 mg/dL — ABNORMAL HIGH (ref 70–99)
Glucose-Capillary: 124 mg/dL — ABNORMAL HIGH (ref 70–99)
Glucose-Capillary: 144 mg/dL — ABNORMAL HIGH (ref 70–99)
Glucose-Capillary: 148 mg/dL — ABNORMAL HIGH (ref 70–99)
Glucose-Capillary: 161 mg/dL — ABNORMAL HIGH (ref 70–99)
Glucose-Capillary: 265 mg/dL — ABNORMAL HIGH (ref 70–99)

## 2019-05-23 LAB — PREALBUMIN: Prealbumin: 11.2 mg/dL — ABNORMAL LOW (ref 18–38)

## 2019-05-23 LAB — PHOSPHORUS: Phosphorus: 3.8 mg/dL (ref 2.5–4.6)

## 2019-05-23 LAB — PROCALCITONIN: Procalcitonin: 0.59 ng/mL

## 2019-05-23 LAB — TSH: TSH: 1.053 u[IU]/mL (ref 0.350–4.500)

## 2019-05-23 MED ORDER — SODIUM CHLORIDE 0.9 % IV SOLN
2.0000 g | Freq: Two times a day (BID) | INTRAVENOUS | Status: DC
  Administered 2019-05-23 – 2019-05-26 (×7): 2 g via INTRAVENOUS
  Filled 2019-05-23 (×8): qty 2

## 2019-05-23 MED ORDER — TBO-FILGRASTIM 300 MCG/0.5ML ~~LOC~~ SOSY
300.0000 ug | PREFILLED_SYRINGE | Freq: Every day | SUBCUTANEOUS | Status: DC
  Administered 2019-05-23 – 2019-05-26 (×4): 300 ug via SUBCUTANEOUS
  Filled 2019-05-23 (×4): qty 0.5

## 2019-05-23 MED ORDER — POLYETHYLENE GLYCOL 3350 17 G PO PACK
17.0000 g | PACK | Freq: Every day | ORAL | Status: DC | PRN
  Administered 2019-05-23: 17 g via ORAL
  Filled 2019-05-23: qty 1

## 2019-05-23 MED ORDER — LIP MEDEX EX OINT
TOPICAL_OINTMENT | CUTANEOUS | Status: DC | PRN
  Administered 2019-05-23: 23:00:00 via TOPICAL
  Filled 2019-05-23: qty 7

## 2019-05-23 MED ORDER — LIDOCAINE-PRILOCAINE 2.5-2.5 % EX CREA
TOPICAL_CREAM | Freq: Once | CUTANEOUS | Status: DC
  Filled 2019-05-23: qty 5

## 2019-05-23 MED ORDER — TRAMADOL HCL 50 MG PO TABS: 25.0000 mg | ORAL_TABLET | ORAL | Status: DC | PRN

## 2019-05-23 MED ORDER — SODIUM CHLORIDE 0.9 % IV BOLUS
500.0000 mL | Freq: Once | INTRAVENOUS | Status: AC
  Administered 2019-05-23: 06:00:00 500 mL via INTRAVENOUS

## 2019-05-23 MED ORDER — ONDANSETRON HCL 4 MG/2ML IJ SOLN: 4.0000 mg | Freq: Four times a day (QID) | INTRAMUSCULAR | Status: DC | PRN

## 2019-05-23 MED ORDER — SENNOSIDES-DOCUSATE SODIUM 8.6-50 MG PO TABS
1.0000 | ORAL_TABLET | Freq: Two times a day (BID) | ORAL | Status: DC
  Administered 2019-05-23 – 2019-05-26 (×5): 1 via ORAL
  Filled 2019-05-23 (×6): qty 1

## 2019-05-23 MED ORDER — ACETAMINOPHEN 325 MG PO TABS
650.0000 mg | ORAL_TABLET | Freq: Four times a day (QID) | ORAL | Status: DC | PRN
  Administered 2019-05-23 – 2019-05-24 (×4): 650 mg via ORAL
  Filled 2019-05-23 (×4): qty 2

## 2019-05-23 MED ORDER — LEVOTHYROXINE SODIUM 88 MCG PO TABS
88.0000 ug | ORAL_TABLET | ORAL | Status: DC
  Administered 2019-05-23: 88 ug via ORAL
  Filled 2019-05-23: qty 1

## 2019-05-23 MED ORDER — ACETAMINOPHEN 650 MG RE SUPP: 650.0000 mg | Freq: Four times a day (QID) | RECTAL | Status: DC | PRN

## 2019-05-23 MED ORDER — SODIUM CHLORIDE 0.9 % IV SOLN
INTRAVENOUS | Status: AC
  Administered 2019-05-23: 13:00:00 via INTRAVENOUS

## 2019-05-23 MED ORDER — LEVOTHYROXINE SODIUM 88 MCG PO TABS
44.0000 ug | ORAL_TABLET | ORAL | Status: DC
  Administered 2019-05-24 – 2019-05-26 (×2): 44 ug via ORAL
  Filled 2019-05-23 (×2): qty 1

## 2019-05-23 MED ORDER — VANCOMYCIN HCL IN DEXTROSE 1-5 GM/200ML-% IV SOLN
1000.0000 mg | INTRAVENOUS | Status: DC
  Administered 2019-05-23 – 2019-05-24 (×2): 1000 mg via INTRAVENOUS
  Filled 2019-05-23 (×2): qty 200

## 2019-05-23 MED ORDER — ONDANSETRON HCL 4 MG PO TABS: 4.0000 mg | ORAL_TABLET | Freq: Four times a day (QID) | ORAL | Status: DC | PRN

## 2019-05-23 MED ORDER — INSULIN ASPART 100 UNIT/ML ~~LOC~~ SOLN
0.0000 [IU] | SUBCUTANEOUS | Status: DC
  Administered 2019-05-23: 17:00:00 2 [IU] via SUBCUTANEOUS
  Administered 2019-05-23 (×3): 1 [IU] via SUBCUTANEOUS
  Administered 2019-05-23: 5 [IU] via SUBCUTANEOUS

## 2019-05-23 MED ORDER — SODIUM CHLORIDE 0.9 % IV SOLN
500.0000 mg | Freq: Every day | INTRAVENOUS | Status: DC
  Administered 2019-05-23 (×2): 500 mg via INTRAVENOUS
  Filled 2019-05-23 (×2): qty 500

## 2019-05-23 MED ORDER — SODIUM CHLORIDE 0.9 % IV BOLUS
500.0000 mL | Freq: Once | INTRAVENOUS | Status: AC
  Administered 2019-05-24: 500 mL via INTRAVENOUS

## 2019-05-23 MED ORDER — PRAVASTATIN SODIUM 40 MG PO TABS
40.0000 mg | ORAL_TABLET | Freq: Every evening | ORAL | Status: DC
  Administered 2019-05-23 – 2019-05-25 (×3): 40 mg via ORAL
  Filled 2019-05-23 (×3): qty 1

## 2019-05-23 MED ORDER — TRAZODONE HCL 50 MG PO TABS
50.0000 mg | ORAL_TABLET | Freq: Every evening | ORAL | Status: DC | PRN
  Administered 2019-05-23: 50 mg via ORAL
  Filled 2019-05-23: qty 1

## 2019-05-23 MED ORDER — BISACODYL 10 MG RE SUPP
10.0000 mg | Freq: Every day | RECTAL | Status: DC | PRN
  Administered 2019-05-23: 10 mg via RECTAL
  Filled 2019-05-23: qty 1

## 2019-05-23 MED ORDER — SODIUM CHLORIDE 0.9 % IV SOLN
INTRAVENOUS | Status: AC
  Administered 2019-05-23: 01:00:00 via INTRAVENOUS

## 2019-05-23 MED ORDER — HYDROCODONE-ACETAMINOPHEN 5-325 MG PO TABS: 1.0000 | ORAL_TABLET | ORAL | Status: DC | PRN

## 2019-05-23 NOTE — Progress Notes (Signed)
CRITICAL VALUE ALERT  Critical Value: Lactic Acid 3.2  Date & Time Notied:  05/23/19 05:50  Provider Notified: Lamar Blinks, NP  Orders Received/Actions taken: NS 570ml fluid bolus, repeat lactic acid @ 9:00am

## 2019-05-23 NOTE — Telephone Encounter (Signed)
Left a message for Tammy Hensley with [facility] regarding patient's admission and need for a bedside commode when she is discharged.

## 2019-05-23 NOTE — Progress Notes (Signed)
Pharmacy Antibiotic Note  Tammy Hensley is a 78 y.o. female admitted on 05/22/2019 with febrile neutropenia and sepsis.  Pharmacy has been consulted for Vancomycin, cefepime dosing.  Plan: Vancomycin 1gm iv x1 2117, then Vancomycin 1000 mg IV Q 24hrs. Goal AUC 400-550. Expected AUC: 482 SCr used: 0.8 (adjusted 0.48)  Cefepime 2gm iv x1, then 2gm iv q12hr    Height: 5\' 2"  (157.5 cm) Weight: 147 lb 7.8 oz (66.9 kg) IBW/kg (Calculated) : 50.1  Temp (24hrs), Avg:100 F (37.8 C), Min:99 F (37.2 C), Max:101.9 F (38.8 C)  Recent Labs  Lab 05/22/19 1951 05/23/19 0055  WBC 0.8* 0.6*  CREATININE 0.59 0.48  LATICACIDVEN 1.3 1.4    Estimated Creatinine Clearance: 52.8 mL/min (by C-G formula based on SCr of 0.48 mg/dL).    Allergies  Allergen Reactions  . Omnipaque [Iohexol] Hives    Pt will need 13 hr prep in the future--SLG  . Calan [Verapamil]     Constipation   . Effexor [Venlafaxine]     unknown  . Erythromycin Nausea And Vomiting  . Femara [Letrozole] Swelling  . Fosamax [Alendronate]     aching  . Ibuprofen     Dizziness, incoherent   . Paxil [Paroxetine Hcl] Nausea Only  . Remeron [Mirtazapine]     Weight gain   . Vasotec [Enalapril] Cough    Antimicrobials this admission: Vancomycin 05/22/2019 >> Cefepime 05/22/2019 >>  Azithromycin 05/23/2019 >>  Dose adjustments this admission: -  Microbiology results: -  Thank you for allowing pharmacy to be a part of this patient's care.  Nani Skillern Crowford 05/23/2019 5:27 AM

## 2019-05-23 NOTE — Progress Notes (Signed)
Tammy Hensley   DOB:1977/04/09   UY#:[id number]    ASSESSMENT & PLAN:  Metastatic uterine cancer She had received chemotherapy recently Continue aggressive supportive care  Acquired pancytopenia Neutropenic fever I will add G-CSF support to keep Buena greater than 1.5 We will follow cultures She will continue broad-spectrum IV antibiotics for now I am not convinced that the abnormal infiltrate on the chest x-ray is due to pneumonia?  It could be atelectasis She does not need transfusion support  Moderate protein calorie malnutrition She is receiving IV fluids She is on low-dose steroids as appetite stimulant  Recurrent falls She has home health nurse checking her as well as home physical therapy I will consult inpatient PT for evaluation  Altered mental status Cause is unknown.  CT head is negative for stroke She has no residual neurological deficit  Code Status DNR  Goals of care The plan is to have resolution of neutropenic fever  Discharge planning She would likely be here till the end of the week until cultures are negative I have called her son and updated him We will discuss with home health care team and update them of her recent admission Will follow  All questions were answered. The patient knows to call the clinic with any problems, questions or concerns.   Tammy Lark, MD 05/23/2019 7:57 AM  Subjective:  Patient is well-known to me. Was trying to get her to be seen in the outpatient clinic yesterday She was admitted to the hospital due to altered mental status According to her son, the patient was somewhat unresponsive yesterday evening and she was brought to the emergency department for evaluation Last Thursday, she had a fall in the middle of the night and was not evaluated. This morning, she has no signs of neurological deficits.  Her mental status is clear The patient is tearful because she is readmitted to the hospital for another complication related to  her recent treatment According to the patient, she fell a week ago when she was trying to get up to go to the bathroom in the middle of the night. She felt that she is eating and drinking well.  She has no pain.  On admission, she was noted to have neutropenic fever but has been afebrile since admission.  Chest x-ray showed mild infiltrate, atelectasis versus pneumonia cannot be excluded Cultures are pending. 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 SEE COMMENT.  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 The greatest extent of invasive carcinoma as measured on the needle core biopsy measures 0.6 cm.     Endometrial cancer ([place])  02/03/2002 Pathology Results   1. BENIGN ENDOMETRIAL POLYPS.  2. UTERINE FIBROIDS: PORTIONS OF SMOOTH MUSCLE CONSISTENT WITH BENIGN LEIOMYOMA(S). PORTIONS OF BENIGN, CYSTICALLY ATROPHIC ENDOMETRIUM WITHOUT HYPERPLASIA OR EVIDENCE OF MALIGNANCY. 3. ENDOMETRIAL CURETTAGE: BENIGN ENDOMETRIUM AND SMOOTH MUSCLE.   09/07/2016 Pathology Results   PAP smear positive for malignant cells   09/07/2016 Initial Diagnosis   Patient had postmenopausal bleeding in 08-2016. She had PAP 09-07-16 by PCP Dr [name], which documented carcinoma. She had evaluation by Dr [name] with colposcopy/ ECC/endometrial  biopsy documenting serous endometrioid endometrial carcinoma.   09/15/2016 Imaging   Ct imaging showed markedly thickened and heterogeneous endometrial stripe suspicious for endometrial carcinoma in this patient with postmenopausal vaginal bleeding. Cystic lesion in  the right ovary cannot be definitively characterized. Pelvic ultrasound is recommended for further evaluation. Aortoiliac atherosclerosis. Avascular necrosis of the femoral heads bilaterally.   10/07/2016 Tumor Marker   Patient's tumor was tested for the following markers: CA125 Results of the tumor marker test revealed 15.1   10/13/2016 Pathology Results   1. Lymph node, sentinel, biopsy, right obturator - THERE IS NO EVIDENCE OF CARCINOMA IN 8 OF 8 LYMPH NODES (0/8). - SEE COMMENT. 2. Lymph node, sentinel, biopsy, left obturator - THERE IS NO EVIDENCE OF CARCINOMA IN 1 OF 1 LYMPH NODE (0/1). - SEE COMMENT. 3. Lymph node, sentinel, biopsy, left para-aortic - THERE IS NO EVIDENCE OF CARCINOMA IN 4 OF 4 LYMPH NODES (0/4). - SEE COMMENT. 4. Uterus +/- tubes/ovaries, neoplastic, cervix - INVASIVE MIXED SEROUS/ENDOMETRIOID ADENOCARCINOMA, MULTIPLE FOCI, FIGO GRADE III, THE LARGEST FOCUS SPANS 8.2 CM. - ADENOCARCINOMA EXTENDS INTO THE OUTER HALF OF THE MYOMETRIUM AND INVOLVES THE STROMA OF THE LOWER UTERINE SEGMENT AND CERVIX. - LYMPHOVASCULAR INVASION IS IDENTIFIED. - THE SURGICAL RESECTION MARGINS ARE NEGATIVE FOR CARCINOMA. - SEE ONCOLOGY TABLE BELOW. ADDITIONAL FINDINGS: - ENDOMETRIUM: ENDOMETRIOID TYPE POLYP(S), WHICH CONTAIN FOCI OF SIMILAR APPEARING MIXED SEROUS/ENDOMETRIOID ADENOCARCINOMA. - MYOMETRIUM: LEIOMYOMATA. - SEROSA: UNREMARKABLE. - BILATERAL ADNEXA: BENIGN OVARIES AND FALLOPIAN TUBES.   10/13/2016 Surgery   Surgery: Total robotic hysterectomy bilateral salpingo-oophorectomy, bilateral pelvic sentinel lymph node removal, left PA sentinel lymph node removal. Mini-laparotomy for specimen removal Surgeons:  [name], MD; [name], MD  Pathology:  1)Uterus, cervix, bilateral tubes and ovaries 2) Right obturator SLN 3) Left Obturator SLN 4) Left PA SLN Operative findings: 14 week fibroid uterus with one large dominant 6 cm myoma that was palpably  calcified. 4 cm right ovarian cyst. + SLN identified in the bilateral obturator spaces, + SLN identified in the left PA space.    12/01/2016 - 05/31/2017 Chemotherapy   She received carboplatin & Taxol. She had 3 cycles of chemo followed by radiation and then 3 more cycles of chemo   01/11/2017 Genetic Testing   Testing was normal and did not reveal a mutation in these genes: The genes tested were the 80 genes on [facility]'s Multi-Cancer panel (ALK, APC, ATM, AXIN2, BAP1, BARD1, BLM, BMPR1A, BRCA1, BRCA2, BRIP1, CASR, CDC73, CDH1, CDK4, CDKN1B, CDKN1C, CDKN2A, CEBPA, CHEK2, DICER1, DIS3L2, EGFR, EPCAM, FH, FLCN, GATA2, GPC3, GREM1, HOXB13,  HRAS, KIT, MAX, MEN1, MET, MITF, MLH1, MSH2, MSH6, MUTYH, NBN, NF1, NF2, PALB2, PDGFRA, PHOX2B, PMS2, POLD1, POLE, POT1, PRKAR1A, PTCH1, PTEN, RAD50, RAD51C, RAD51D, RB1, RECQL4, RET, RUNX1, SDHA, SDHAF2, SDHB, SDHC, SDHD, SMAD4, SMARCA4, SMARCB1, SMARCE1, STK11, SUFU, TERC, TERT, TMEM127, TP53, TSC1, TSC2, VHL, WRN, and WT1).   02/10/2017 - 04/12/2017 Radiation Therapy   02/10/17-03/16/17; 03/29/17-04/12/17;  1) Pelvis/ 45 Gy in 25 fractions  2) Vaginal Cuff/ 18 Gy in 3 fractions   08/26/2018 Imaging   Large destructive mass lesion left iliac bone with large soft tissue mass extending into the posterior soft tissues and pelvis compatible with metastatic disease. Consider follow-up CT chest abdomen pelvis with contrast for further staging.  Radiation changes at L5 and the sacrum. No lumbar metastatic deposits  Multilevel degenerative changes above.   08/29/2018 Imaging   1. 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 2.  New irregular nodular opacity in the medial right middle lobe, new since 2017 CT. Separate subcentimeter right upper lobe solid pulmonary nodule, for which no comparison exists. These findings are indeterminate for pulmonary metastases and attention on follow-up chest CT in 3 months is recommended. 3. No  lymphadenopathy or additional findings of metastatic disease.No tumor recurrence at the hysterectomy margin. 4. Aortic Atherosclerosis (ICD10-I70.0). Additional chronic findings as detailed.   08/29/2018 Tumor Marker   Patient's tumor was tested for the following markers: CA-125 Results of the tumor marker test revealed 17.4   09/06/2018 Procedure   CT-guided core biopsy performed of huge mass in the left iliac fossa destroying the left iliac bone.   09/06/2018 Pathology Results   Soft Tissue Needle Core Biopsy, Left Iliac Fossa - POORLY DIFFERENTIATED MALIGNANT NEOPLASM. Microscopic Comment The provided clinical history of both endometrial and breast cancer is noted. In the current case, immunohistochemistry for qualitative ER demonstrates scattered positive staining. CK8/18 is focally positive. CKAE1/3, CK7, CK20, TTF-1, CDX-2, GATA3 and PAX 8 are negative. The immunophenotype is not specific as to an origin for this poorly differentiated malignant neoplasm. The morphology is more suggestive of endometrial cancer than breast cancer, and may represent an unrecognized carcinosarcomatous component.   09/08/2018 - 09/27/2018 Radiation Therapy   She had palliative radiation treatment   09/29/2018 Procedure   Placement of single lumen port a cath via right internal jugular vein. The catheter tip lies at the cavo-atrial junction. A power injectable port a cath was placed and is ready for immediate use.   10/04/2018 - 01/17/2019 Chemotherapy   The patient had carboplatin and taxol x 6 cycles   12/05/2018 Tumor Marker   Patient's tumor was tested for the following markers: CA125 Results of the tumor marker test revealed 5.5   12/05/2018 Imaging   1. Considerable cystic degeneration in the large destructive mass of the left iliac bone. New or progressive left iliac bone pathologic fracture extending into the left SI joint, along with abnormal subluxation at the pubic symphysis indicating pubic  symphysis instability. 2. Previous pleural-based nodularity in the right middle lobe is markedly improved, currently 4 mm in thickness and previously 11 mm in thickness. 3. Indistinct new wedge-shaped lesion in the left mid kidney, hypodense on portal venous phase images and hyperdense on delayed phase images, suggesting prolong tubular transit. This is a nonspecific finding but could reflect local inflammation, infection, or less likely infiltrative tumor. 4. Other imaging findings of potential clinical significance: Aortic Atherosclerosis (ICD10-I70.0). Left main coronary artery atherosclerosis. 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   02/15/2019 Imaging   1. Slight progression of large expansile lytic lesion involving the left iliac bone. This demonstrates central low density, suggesting partial necrosis. This lesion was biopsied on 09/06/2018, revealing poorly differentiated malignant neoplasm. 2. New low-density lesion in the dome of the right hepatic lobe worrisome for cystic or treated metastasis. 3. No other evidence of metastatic disease. No explanation for the patient's symptoms. 4. 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    03/03/2019 - 04/07/2019 Anti-estrogen oral therapy   She cannot tolerate tamoxifen and megace   03/23/2019 Tumor Marker   Patient's tumor was tested for the following markers: CA125 Results of the tumor marker test revealed 3.8   04/13/2019 Imaging   1. 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 2. Mild decrease in size of large  lytic bone metastasis and associated soft tissue mass involving the left ilium. 3. Unstable pathologic fracture of the left ilium, without significant change since prior exam. 4. New right lower lobe pulmonary metastases.     04/24/2019 -  Chemotherapy   The patient had  DOCEtaxel (TAXOTERE) 130 mg in sodium chloride 0.9 % 250 mL chemo infusion, 75 mg/m2 = 130 mg, Intravenous,  Once, 2 of 4 cycles Dose modification: 50 mg/m2 (66.7 % of original dose 75 mg/m2, Cycle 2, Reason: Dose Not Tolerated), 45 mg/m2 (60 % of original dose 75 mg/m2, Cycle 2, Reason: Dose Not Tolerated) Administration: 130 mg (04/24/2019), 80 mg (05/15/2019)  for chemotherapy treatment.    05/01/2019 - 05/03/2019 Hospital Admission   She was admitted to the hospital for evaluation of fever and weakness   Metastasis to bone ([place])  04/24/2019 -  Chemotherapy   The patient had DOCEtaxel (TAXOTERE) 130 mg in sodium chloride 0.9 % 250 mL chemo infusion, 75 mg/m2 = 130 mg, Intravenous,  Once, 2 of 4 cycles Dose modification: 50 mg/m2 (66.7 % of original dose 75 mg/m2, Cycle 2, Reason: Dose Not Tolerated), 45 mg/m2 (60 % of original dose 75 mg/m2, Cycle 2, Reason: Dose Not Tolerated) Administration: 130 mg (04/24/2019), 80 mg (05/15/2019)  for chemotherapy treatment.    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([place])  02/16/2019 Initial Diagnosis   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([place])   04/24/2019 -  Chemotherapy   The patient had DOCEtaxel (TAXOTERE) 130 mg in sodium chloride 0.9 % 250 mL chemo infusion, 75 mg/m2 = 130 mg, Intravenous,  Once, 2 of 4 cycles Dose modification: 50 mg/m2 (66.7 % of original dose 75 mg/m2, Cycle 2, Reason: Dose Not Tolerated), 45 mg/m2 (60 % of original dose 75 mg/m2, Cycle 2, Reason: Dose Not Tolerated) Administration: 130 mg (04/24/2019), 80 mg (05/15/2019)  for chemotherapy treatment.       Objective:  Vitals:   05/23/19 0400 05/23/19 0724  BP: 137/61 (!) 167/79  Pulse: 92 100  Resp: 17 19  Temp: 99.2 F (37.3 C) 99.9 F (37.7 C)  SpO2: 97% 99%     Intake/Output Summary (Last 24 hours) at 05/23/2019 0757 Last data filed at 05/23/2019 0600 Gross per 24 hour  Intake 1263.08 ml  Output 1 ml  Net 1262.08 ml    GENERAL:alert, no distress and comfortable.  She looks mildly cushingoid SKIN: skin  color, texture, turgor are normal, no rashes or significant lesions EYES: normal, Conjunctiva are pink and non-injected, sclera clear OROPHARYNX:no exudate, no erythema and lips, buccal mucosa, and tongue normal  NECK: supple, thyroid normal size, non-tender, without nodularity LYMPH:  no palpable lymphadenopathy in the cervical, axillary or inguinal LUNGS: clear to auscultation and percussion with normal breathing effort HEART: regular rate & rhythm and no murmurs with moderate bilateral lower extremity edema ABDOMEN:abdomen soft, non-tender and normal bowel sounds Musculoskeletal:no cyanosis of digits and no clubbing  NEURO: alert & oriented x 3 with fluent speech, no focal motor/sensory deficits   Labs:  Recent Labs    05/01/19 1321  05/15/19 1011 05/22/19 1951 05/23/19 0055  NA 132*   < > 134* 135 139  K 2.8*   < > 4.1 3.7 3.5  CL 92*   < > 96* 97* 103  CO2 30   < > _0 GLUCOSE 137*   < > 173* 125* 125*  BUN 9   < > _1 CREATININE 0.65   < > 0.72 0.59 0.48  CALCIUM 8.6*   < > 9.0 8.4* 8.4*  GFRNONAA >60   < > >60 >60 >60  GFRAA >60   < > >60 >60 >60  PROT 5.6*   < > 6.0* 5.4* 5.4*  ALBUMIN 3.0*   < > 2.8* 2.7* 2.7*  AST 44*   < > 41 30 33  ALT 28   < > 37 26 24  ALKPHOS 119   < > 245* 199* 180*  BILITOT 0.8   < > 0.4 0.5 0.9  BILIDIR 0.3*  --   --   --   --   IBILI 0.5  --   --   --   --    < > = values in this interval not displayed.    Studies:  Ct Head Wo Contrast  Result Date: 05/22/2019 CLINICAL DATA:  78 year old female with increased weakness and fever. 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 EXAM: CT HEAD WITHOUT CONTRAST TECHNIQUE: Contiguous axial images were obtained from the base of the skull through the vertex without intravenous contrast. COMPARISON:  Head and cervical spine CT 05/01/2019 and earlier. FINDINGS: Brain: Stable cerebral volume. Stable gray-white matter differentiation throughout the brain. No midline shift,  ventriculomegaly, mass effect, evidence of mass lesion, intracranial hemorrhage or evidence of cortically based acute infarction. No cortical encephalomalacia. Vascular: Calcified atherosclerosis at the skull base. No suspicious intracranial vascular hyperdensity. Skull: Stable. No acute or suspicious osseous lesion. Sinuses/Orbits: Visualized paranasal sinuses and mastoids are stable and well pneumatized. Other: Visualized orbits and scalp soft tissues are within normal limits. IMPRESSION: Stable and negative for age noncontrast CT appearance of the brain. Note that early metastatic disease to the brain cannot be excluded in the absence of intravenous contrast. Electronically Signed   By: Genevie Ann M.D.   On: 05/22/2019 23:32   Ct Head Wo Contrast  Result Date: 05/01/2019 CLINICAL DATA:  Posttraumatic headache after fall today. EXAM: CT HEAD WITHOUT CONTRAST CT CERVICAL SPINE WITHOUT CONTRAST TECHNIQUE: Multidetector CT imaging of the head and cervical spine was performed following the standard protocol without intravenous contrast. Multiplanar CT image reconstructions of the cervical spine were also generated. COMPARISON:  None. FINDINGS: CT HEAD FINDINGS Brain: Mild diffuse cortical atrophy is noted. Mild chronic ischemic white matter disease is noted. No mass effect or midline shift is noted. Ventricular size is within normal limits. There is no evidence of mass lesion, hemorrhage or acute infarction. Vascular: No hyperdense vessel or unexpected calcification. Skull: Normal. Negative for fracture or focal lesion. Sinuses/Orbits: No acute finding. Other: None. CT CERVICAL SPINE FINDINGS Alignment: Minimal grade 1 anterolisthesis of C4-5 is noted secondary to posterior facet joint hypertrophy. Skull base and vertebrae: No acute fracture. No primary bone lesion or focal pathologic process. Soft tissues and spinal canal: No prevertebral fluid or swelling. No visible canal hematoma. Disc levels: Mild degenerative  disc disease is noted at C5-6 with anterior posterior osteophyte formation. There appears to be fusion of the C6-7 disc space most likely due to degenerative change. Upper chest: Negative. Other: Degenerative changes are seen involving posterior facet joints bilaterally. IMPRESSION: Mild diffuse cortical atrophy. Mild chronic ischemic white matter disease. No acute intracranial abnormality seen. Multilevel degenerative disc disease. No acute abnormality seen in the cervical spine. Electronically Signed   By: Marijo Conception M.D.   On: 05/01/2019 15:17   Ct Cervical Spine Wo Contrast  Result Date: 05/01/2019 CLINICAL DATA:  Posttraumatic headache after fall today. EXAM: CT HEAD WITHOUT CONTRAST  CT CERVICAL SPINE WITHOUT CONTRAST TECHNIQUE: Multidetector CT imaging of the head and cervical spine was performed following the standard protocol without intravenous contrast. Multiplanar CT image reconstructions of the cervical spine were also generated. COMPARISON:  None. FINDINGS: CT HEAD FINDINGS Brain: Mild diffuse cortical atrophy is noted. Mild chronic ischemic white matter disease is noted. No mass effect or midline shift is noted. Ventricular size is within normal limits. There is no evidence of mass lesion, hemorrhage or acute infarction. Vascular: No hyperdense vessel or unexpected calcification. Skull: Normal. Negative for fracture or focal lesion. Sinuses/Orbits: No acute finding. Other: None. CT CERVICAL SPINE FINDINGS Alignment: Minimal grade 1 anterolisthesis of C4-5 is noted secondary to posterior facet joint hypertrophy. Skull base and vertebrae: No acute fracture. No primary bone lesion or focal pathologic process. Soft tissues and spinal canal: No prevertebral fluid or swelling. No visible canal hematoma. Disc levels: Mild degenerative disc disease is noted at C5-6 with anterior posterior osteophyte formation. There appears to be fusion of the C6-7 disc space most likely due to degenerative change.  Upper chest: Negative. Other: Degenerative changes are seen involving posterior facet joints bilaterally. IMPRESSION: Mild diffuse cortical atrophy. Mild chronic ischemic white matter disease. No acute intracranial abnormality seen. Multilevel degenerative disc disease. No acute abnormality seen in the cervical spine. Electronically Signed   By: Marijo Conception M.D.   On: 05/01/2019 15:17   Dg Chest Port 1 View  Result Date: 05/22/2019 CLINICAL DATA:  78 y/o  F; sepsis. EXAM: PORTABLE CHEST 1 VIEW COMPARISON:  12/05/2018 CT of the chest. FINDINGS: Normal cardiac silhouette. Aortic atherosclerosis with calcification. Right port catheter tip projects over lower SVC. Streaky opacity at the right lung base. No pleural effusion or pneumothorax. No acute osseous abnormality is evident. IMPRESSION: Streaky opacity at the right lung base may represent atelectasis or pneumonia. Electronically Signed   By: Kristine Garbe M.D.   On: 05/22/2019 20:45

## 2019-05-23 NOTE — Progress Notes (Signed)
PT Cancellation Note  Patient Details Name: Tammy Hensley MRN: [id number] DOB: [birth year]   Cancelled Treatment:    Reason Eval/Treat Not Completed: Fatigue/lethargy limiting ability to participate   Weston Anna, [place] Pager: [phone] Office: [phone]

## 2019-05-23 NOTE — Progress Notes (Signed)
OT Cancellation Note  Patient Details Name: NIKKY DUBA MRN: [id number] DOB: July 31, 1941   Cancelled Treatment:    Reason Eval/Treat Not Completed: Fatigue/lethargy limiting ability to participate  Betsy Pries 05/23/2019, 2:55 PM   Kari Baars, OT Acute Rehabilitation Services [phone] Office- [phone]

## 2019-05-23 NOTE — Progress Notes (Addendum)
Progress Note    Tammy Hensley  RFF:[id number] DOB: November 30, 1940  DOA: 05/22/2019 PCP: [name], MD    Brief Narrative:     Medical records reviewed and are as summarized below:  Tammy Hensley is an 78 y.o. female Presented with generalized increased fatigue and fever started today recently had negative cover test.  Recent fall on Thursday 4 days ago hitting her face. admitted to the hospital from the 8th-10th of June for fever and weakness and at that time was diagnosed with neutropenic fever and no source was noted at that time.  Assessment/Plan:   Active Problems:   Malignant neoplasm of upper-outer quadrant of left breast in female, estrogen receptor positive ([place])   Hypothyroidism   Hypertension   Hyperlipidemia   Endometrial cancer ([place])   Cancer associated pain   Fluid retention in legs   Anemia, chronic disease   HCAP (healthcare-associated pneumonia)   Neutropenic fever ([place])   Sepsis ([place])   Fall at home, initial encounter    Neutropenic fever ([place]) -continue broad-spectrum antibiotics cefepime and vancomycin added azithromycin given possible pneumonia (not convinced)  - Await results of sputum and blood cultures. -appreciate oncology   AMS/encephalopathy -seems resolved -patient ill appearing but appropriate   Hypothyroidism -continue home medications at current dose  Hypertension  -hold home medications given soft blood pressures  Hyperlipidemia  -continue home medications  Endometrial cancer (HCC)-metastatic.   - last chemotherapy was sometime last week -Dr. Alvy Bimler saw  Anemia, chronic disease  - continue to monitor  fall  -resulting in extensive facial bruising -PT eval once feeling better  Moderate protein calorie malnutrition -encourage PO intake   Family Communication/Anticipated D/C date and plan/Code Status   DVT prophylaxis: scd. Code Status: dnr Family Communication: Dr. Alvy Bimler spoke with son Disposition  Plan: pending improvement   Medical Consultants:    Dr. Alvy Bimler  Subjective:   Says she is not feeling well  Objective:    Vitals:   05/23/19 0724 05/23/19 0951 05/23/19 0956 05/23/19 1132  BP: (!) 167/79   125/77  Pulse: 100  (!) 119 91  Resp: 19   18  Temp: 99.9 F (37.7 C) (!) 100.4 F (38 C)  99 F (37.2 C)  TempSrc: Oral Oral  Oral  SpO2: 99% (!) 88%  98%  Weight:      Height:        Intake/Output Summary (Last 24 hours) at 05/23/2019 1145 Last data filed at 05/23/2019 1000 Gross per 24 hour  Intake 1263.08 ml  Output 1101 ml  Net 162.08 ml   Filed Weights   05/22/19 2021 05/23/19 0102  Weight: 65.8 kg 66.9 kg    Exam: In bed, ill appearing Bruise around eye rrr Skin warm and moist alert  Data Reviewed:   I have personally reviewed following labs and imaging studies:  Labs: Labs show the following:   Basic Metabolic Panel: Recent Labs  Lab 05/22/19 1951 05/23/19 0055  NA 135 139  K 3.7 3.5  CL 97* 103  CO2 27 27  GLUCOSE 125* 125*  BUN 15 12  CREATININE 0.59 0.48  CALCIUM 8.4* 8.4*  MG  --  1.8  PHOS  --  3.8   GFR Estimated Creatinine Clearance: 52.8 mL/min (by C-G formula based on SCr of 0.48 mg/dL). Liver Function Tests: Recent Labs  Lab 05/22/19 1951 05/23/19 0055  AST 30 33  ALT 26 24  ALKPHOS 199* 180*  BILITOT 0.5 0.9  PROT  5.4* 5.4*  ALBUMIN 2.7* 2.7*   No results for input(s): LIPASE, AMYLASE in the last 168 hours. No results for input(s): AMMONIA in the last 168 hours. Coagulation profile No results for input(s): INR, PROTIME in the last 168 hours.  CBC: Recent Labs  Lab 05/22/19 1951 05/23/19 0055  WBC 0.8* 0.6*  NEUTROABS 0.3*  --   HGB 9.0* 9.1*  HCT 28.3* 30.1*  MCV 78.8* 80.5  PLT 403* 345   Cardiac Enzymes: No results for input(s): CKTOTAL, CKMB, CKMBINDEX, TROPONINI in the last 168 hours. BNP (last 3 results) No results for input(s): PROBNP in the last 8760 hours. CBG: Recent Labs  Lab  05/23/19 0052 05/23/19 0407 05/23/19 0718 05/23/19 1130  GLUCAP 119* 148* 103* 124*   D-Dimer: No results for input(s): DDIMER in the last 72 hours. Hgb A1c: Recent Labs    05/23/19 0055  HGBA1C 7.4*   Lipid Profile: No results for input(s): CHOL, HDL, LDLCALC, TRIG, CHOLHDL, LDLDIRECT in the last 72 hours. Thyroid function studies: Recent Labs    05/23/19 0055  TSH 1.053   Anemia work up: No results for input(s): VITAMINB12, FOLATE, FERRITIN, TIBC, IRON, RETICCTPCT in the last 72 hours. Sepsis Labs: Recent Labs  Lab 05/22/19 1951 05/23/19 0055 05/23/19 0512 05/23/19 0857  PROCALCITON  --  0.59  --   --   WBC 0.8* 0.6*  --   --   LATICACIDVEN 1.3 1.4 3.2* 1.6    Microbiology Recent Results (from the past 240 hour(s))  Blood Culture (routine x 2)     Status: None (Preliminary result)   Collection Time: 05/22/19  7:51 PM   Specimen: BLOOD  Result Value Ref Range Status   Specimen Description   Final    BLOOD RIGHT ANTECUBITAL Performed at [facility], [place] [address], [place], [place] [address]    Special Requests   Final    BOTTLES DRAWN AEROBIC AND ANAEROBIC Blood Culture adequate volume Performed at [facility] [address], [place], [place] [address]    Culture   Final    NO GROWTH < 12 HOURS Performed at [facility] [address], [place], [place] [address]    Report Status PENDING  Incomplete  SARS Coronavirus 2 (CEPHEID- Performed in [facility] hospital lab), Hosp Order     Status: None   Collection Time: 05/22/19  7:52 PM   Specimen: Nasopharyngeal Swab  Result Value Ref Range Status   SARS Coronavirus 2 NEGATIVE NEGATIVE Final    Comment: (NOTE) If result is NEGATIVE SARS-CoV-2 target nucleic acids are NOT DETECTED. The SARS-CoV-2 RNA is generally detectable in upper and lower  respiratory specimens during the acute phase of infection. The lowest  concentration of SARS-CoV-2 viral copies this  assay can detect is 250  copies / mL. A negative result does not preclude SARS-CoV-2 infection  and should not be used as the sole basis for treatment or other  patient management decisions.  A negative result may occur with  improper specimen collection / handling, submission of specimen other  than nasopharyngeal swab, presence of viral mutation(s) within the  areas targeted by this assay, and inadequate number of viral copies  (<250 copies / mL). A negative result must be combined with clinical  observations, patient history, and epidemiological information. If result is POSITIVE SARS-CoV-2 target nucleic acids are DETECTED. The SARS-CoV-2 RNA is generally detectable in upper and lower  respiratory specimens dur ing the acute phase of  infection.  Positive  results are indicative of active infection with SARS-CoV-2.  Clinical  correlation with patient history and other diagnostic information is  necessary to determine patient infection status.  Positive results do  not rule out bacterial infection or co-infection with other viruses. If result is PRESUMPTIVE POSTIVE SARS-CoV-2 nucleic acids MAY BE PRESENT.   A presumptive positive result was obtained on the submitted specimen  and confirmed on repeat testing.  While 2019 novel coronavirus  (SARS-CoV-2) nucleic acids may be present in the submitted sample  additional confirmatory testing may be necessary for epidemiological  and / or clinical management purposes  to differentiate between  SARS-CoV-2 and other Sarbecovirus currently known to infect humans.  If clinically indicated additional testing with an alternate test  methodology [phone]) is advised. The SARS-CoV-2 RNA is generally  detectable in upper and lower respiratory sp ecimens during the acute  phase of infection. The expected result is Negative. Fact Sheet for Patients:  StrictlyIdeas.no Fact Sheet for Healthcare Providers:  BankingDealers.co.za This test is not yet approved or cleared by the [place] FDA and has been authorized for detection and/or diagnosis of SARS-CoV-2 by FDA under an Emergency Use Authorization (EUA).  This EUA will remain in effect (meaning this test can be used) for the duration of the COVID-19 declaration under Section 564(b)(1) of the Act, 21 U.S.C. section 360bbb-3(b)(1), unless the authorization is terminated or revoked sooner. Performed at [facility], [place] [address], [place], [place] [address]   Blood Culture (routine x 2)     Status: None (Preliminary result)   Collection Time: 05/22/19  7:56 PM   Specimen: BLOOD  Result Value Ref Range Status   Specimen Description   Final    BLOOD BLOOD LEFT HAND Performed at [facility] [address] [place], [place] [address]    Special Requests   Final    BOTTLES DRAWN AEROBIC AND ANAEROBIC Blood Culture adequate volume Performed at [facility] [address] [place], [place] [address]    Culture   Final    NO GROWTH < 12 HOURS Performed at [facility] [address] [place], [place] [address]    Report Status PENDING  Incomplete  MRSA PCR Screening     Status: None   Collection Time: 05/23/19 10:11 AM   Specimen: Nasal Mucosa; Nasopharyngeal  Result Value Ref Range Status   MRSA by PCR NEGATIVE NEGATIVE Final    Comment:        The GeneXpert MRSA Assay (FDA approved for NASAL specimens only), is one component of a comprehensive MRSA colonization surveillance program. It is not intended to diagnose MRSA infection nor to guide or monitor treatment for MRSA infections. Performed at [facility], [place] [address], [place], [place] [address]     Procedures and diagnostic studies:  Ct Head Wo Contrast  Result Date: 05/22/2019 CLINICAL DATA:  78 year old female with increased weakness and fever. Stage IV  endometrial carcinoma with progression last month. EXAM: CT HEAD WITHOUT CONTRAST TECHNIQUE: Contiguous axial images were obtained from the base of the skull through the vertex without intravenous contrast. COMPARISON:  Head and cervical spine CT 05/01/2019 and earlier. FINDINGS: Brain: Stable cerebral volume. Stable gray-white matter differentiation throughout the brain. No midline shift, ventriculomegaly, mass effect, evidence of mass lesion, intracranial hemorrhage or evidence of cortically based acute infarction. No cortical encephalomalacia. Vascular: Calcified atherosclerosis at the skull base. No suspicious intracranial vascular hyperdensity. Skull:  Stable. No acute or suspicious osseous lesion. Sinuses/Orbits: Visualized paranasal sinuses and mastoids are stable and well pneumatized. Other: Visualized orbits and scalp soft tissues are within normal limits. IMPRESSION: Stable and negative for age noncontrast CT appearance of the brain. Note that early metastatic disease to the brain cannot be excluded in the absence of intravenous contrast. Electronically Signed   By: Genevie Ann M.D.   On: 05/22/2019 23:32   Dg Chest Port 1 View  Result Date: 05/22/2019 CLINICAL DATA:  78 y/o  F; sepsis. EXAM: PORTABLE CHEST 1 VIEW COMPARISON:  12/05/2018 CT of the chest. FINDINGS: Normal cardiac silhouette. Aortic atherosclerosis with calcification. Right port catheter tip projects over lower SVC. Streaky opacity at the right lung base. No pleural effusion or pneumothorax. No acute osseous abnormality is evident. IMPRESSION: Streaky opacity at the right lung base may represent atelectasis or pneumonia. Electronically Signed   By: Kristine Garbe M.D.   On: 05/22/2019 20:45    Medications:   . insulin aspart  0-9 Units Subcutaneous Q4H  . [START ON 05/24/2019] levothyroxine  44 mcg Oral Once per day on Mon Wed Fri Sat  . levothyroxine  88 mcg Oral Once per day on Sun Tue Thu  . lidocaine-prilocaine   Topical  Once  . pravastatin  40 mg Oral QPM  . Tbo-filgastrim (GRANIX) SQ  300 mcg Subcutaneous Daily   Continuous Infusions: . sodium chloride    . azithromycin Stopped (05/23/19 0220)  . ceFEPime (MAXIPIME) IV 2 g (05/23/19 0932)  . vancomycin       LOS: 0 days   Geradine Girt  [facility]   How to contact the [facility] Attending or Consulting provider [place] or covering provider during after hours [place], for this patient?  1. Check the care team in [facility] and look for a) attending/consulting [facility] provider listed and b) the [facility] team listed 2. Log into [email or web address] and use [place]'s universal password to access. If you do not have the password, please contact the hospital operator. 3. Locate the [facility] provider you are looking for under [facility] and page to a number that you can be directly reached. 4. If you still have difficulty reaching the provider, please page the [facility] (Director on Call) for the Hospitalists listed on amion for assistance.  05/23/2019, 11:45 AM

## 2019-05-23 NOTE — Plan of Care (Signed)
  Problem: Education: Goal: Knowledge of General Education information will improve Description: Including pain rating scale, medication(s)/side effects and non-pharmacologic comfort measures Outcome: Progressing   Problem: Clinical Measurements: Goal: Will remain free from infection Outcome: Progressing   Problem: Clinical Measurements: Goal: Diagnostic test results will improve Outcome: Progressing   Problem: Clinical Measurements: Goal: Respiratory complications will improve Outcome: Progressing   Problem: Clinical Measurements: Goal: Cardiovascular complication will be avoided Outcome: Progressing   Problem: Activity: Goal: Risk for activity intolerance will decrease Outcome: Progressing

## 2019-05-24 ENCOUNTER — Other Ambulatory Visit: Payer: Medicare Other

## 2019-05-24 ENCOUNTER — Ambulatory Visit: Payer: Medicare Other | Admitting: Hematology and Oncology

## 2019-05-24 LAB — POTASSIUM: Potassium: 3.3 mmol/L — ABNORMAL LOW (ref 3.5–5.1)

## 2019-05-24 LAB — CBC WITH DIFFERENTIAL/PLATELET
Abs Immature Granulocytes: 0.02 10*3/uL (ref 0.00–0.07)
Basophils Absolute: 0 10*3/uL (ref 0.0–0.1)
Basophils Relative: 1 %
Eosinophils Absolute: 0 10*3/uL (ref 0.0–0.5)
Eosinophils Relative: 0 %
HCT: 25.7 % — ABNORMAL LOW (ref 36.0–46.0)
Hemoglobin: 8.2 g/dL — ABNORMAL LOW (ref 12.0–15.0)
Immature Granulocytes: 3 %
Lymphocytes Relative: 34 %
Lymphs Abs: 0.3 10*3/uL — ABNORMAL LOW (ref 0.7–4.0)
MCH: 24.9 pg — ABNORMAL LOW (ref 26.0–34.0)
MCHC: 31.9 g/dL (ref 30.0–36.0)
MCV: 78.1 fL — ABNORMAL LOW (ref 80.0–100.0)
Monocytes Absolute: 0.2 10*3/uL (ref 0.1–1.0)
Monocytes Relative: 28 %
Neutro Abs: 0.3 10*3/uL — ABNORMAL LOW (ref 1.7–7.7)
Neutrophils Relative %: 34 %
Platelets: 342 10*3/uL (ref 150–400)
RBC: 3.29 MIL/uL — ABNORMAL LOW (ref 3.87–5.11)
RDW: 21.2 % — ABNORMAL HIGH (ref 11.5–15.5)
WBC: 0.7 10*3/uL — CL (ref 4.0–10.5)
nRBC: 12.2 % — ABNORMAL HIGH (ref 0.0–0.2)

## 2019-05-24 LAB — BASIC METABOLIC PANEL
Anion gap: 8 (ref 5–15)
BUN: 9 mg/dL (ref 8–23)
CO2: 27 mmol/L (ref 22–32)
Calcium: 8.1 mg/dL — ABNORMAL LOW (ref 8.9–10.3)
Chloride: 105 mmol/L (ref 98–111)
Creatinine, Ser: 0.49 mg/dL (ref 0.44–1.00)
GFR calc Af Amer: >60 mL/min (ref >60)
GFR calc non Af Amer: >60 mL/min (ref >60)
Glucose, Bld: 97 mg/dL (ref 70–99)
Potassium: 2.7 mmol/L — CL (ref 3.5–5.1)
Sodium: 140 mmol/L (ref 135–145)

## 2019-05-24 LAB — GLUCOSE, CAPILLARY
Glucose-Capillary: 98 mg/dL (ref 70–99)
Glucose-Capillary: 111 mg/dL — ABNORMAL HIGH (ref 70–99)
Glucose-Capillary: 157 mg/dL — ABNORMAL HIGH (ref 70–99)
Glucose-Capillary: 136 mg/dL — ABNORMAL HIGH (ref 70–99)
Glucose-Capillary: 155 mg/dL — ABNORMAL HIGH (ref 70–99)

## 2019-05-24 LAB — MAGNESIUM: Magnesium: 1.7 mg/dL (ref 1.7–2.4)

## 2019-05-24 LAB — VANCOMYCIN, PEAK: Vancomycin Pk: 20 ug/mL — ABNORMAL LOW (ref 30–40)

## 2019-05-24 MED ORDER — ADULT MULTIVITAMIN W/MINERALS CH
1.0000 | ORAL_TABLET | Freq: Every day | ORAL | Status: DC
  Administered 2019-05-24 – 2019-05-26 (×3): 1 via ORAL
  Filled 2019-05-24 (×3): qty 1

## 2019-05-24 MED ORDER — INSULIN ASPART 100 UNIT/ML ~~LOC~~ SOLN
0.0000 [IU] | Freq: Every day | SUBCUTANEOUS | Status: DC
  Administered 2019-05-25: 2 [IU] via SUBCUTANEOUS

## 2019-05-24 MED ORDER — SODIUM CHLORIDE 0.9% FLUSH
10.0000 mL | INTRAVENOUS | Status: DC | PRN
  Administered 2019-05-26: 10 mL
  Filled 2019-05-24: qty 40

## 2019-05-24 MED ORDER — POTASSIUM CHLORIDE CRYS ER 20 MEQ PO TBCR
40.0000 meq | EXTENDED_RELEASE_TABLET | ORAL | Status: AC
  Administered 2019-05-24 (×2): 40 meq via ORAL
  Filled 2019-05-24 (×2): qty 2

## 2019-05-24 MED ORDER — INSULIN ASPART 100 UNIT/ML ~~LOC~~ SOLN
0.0000 [IU] | Freq: Three times a day (TID) | SUBCUTANEOUS | Status: DC
  Administered 2019-05-24: 1 [IU] via SUBCUTANEOUS
  Administered 2019-05-24: 2 [IU] via SUBCUTANEOUS
  Administered 2019-05-25: 3 [IU] via SUBCUTANEOUS
  Administered 2019-05-25 – 2019-05-26 (×2): 2 [IU] via SUBCUTANEOUS

## 2019-05-24 MED ORDER — ENSURE ENLIVE PO LIQD
237.0000 mL | Freq: Two times a day (BID) | ORAL | Status: DC
  Administered 2019-05-25 – 2019-05-26 (×2): 237 mL via ORAL

## 2019-05-24 NOTE — Progress Notes (Signed)
Ok to dc azithromycin per Dr. Tana [place] due to not likely PNA.   Onnie Boer, PharmD, BCIDP, AAHIVP, CPP Infectious Disease Pharmacist 05/24/2019 9:52 AM

## 2019-05-24 NOTE — Progress Notes (Signed)
Tammy Hensley   DOB:1941/04/12   NO#:[id number]    ASSESSMENT & PLAN:  Metastatic uterine cancer She had received chemotherapy recently Continue aggressive supportive care  Acquired pancytopenia She has neutropenic fever She was started on G-CSF support since 05/23/19 to keep [place] greater than 1.5 We will follow cultures She will continue broad-spectrum IV antibiotics for now I am not convinced that the abnormal infiltrate on the chest x-ray is due to pneumonia?  It could be atelectasis She does not need transfusion support today  Moderate protein calorie malnutrition She is receiving IV fluids She is on low-dose steroids as appetite stimulant She is being followed by dietitian  Recurrent falls She has home health nurse checking her as well as home physical therapy I recommend inpatient PT for evaluation  Altered mental status, resolved  cause is unknown.  CT head is negative for stroke She has no residual neurological deficit  Code Status DNR  Goals of care The plan is to have resolution of neutropenic fever  Discharge planning She would likely be here till the end of the week until cultures are negative and she becomes afebrile I have called her son and updated him We will discuss with home health care team and update them of her recent admission Will follow All questions were answered. The patient knows to call the clinic with any problems, questions or concerns.   [name], MD 05/24/2019 7:37 AM  Subjective:  She was febrile last night but did not feel too bad.  She denies cough.  No sore throat.  No localizing signs or symptoms of infection She denies diarrhea She did not participate with physical therapy yesterday due to excessive fatigue She is eating and drinking well She is not tearful today  Objective:  Vitals:   05/24/19 0155 05/24/19 0453  BP: (!) 114/58 112/67  Pulse: 65 (!) 108  Resp: 18 18  Temp: 99.6 F (37.6 C) 99.7 F (37.6 C)  SpO2:  97% 97%     Intake/Output Summary (Last 24 hours) at 05/24/2019 0737 Last data filed at 05/24/2019 0600 Gross per 24 hour  Intake 2520.76 ml  Output 3100 ml  Net -579.24 ml    GENERAL:alert, no distress and comfortable.  She looks cushingoid SKIN: skin color, texture, turgor are normal, no rashes or significant lesions.  The bruising around her eye is improving EYES: normal, Conjunctiva are pink and non-injected, sclera clear OROPHARYNX:no exudate, no erythema and lips, buccal mucosa, and tongue normal  NECK: supple, thyroid normal size, non-tender, without nodularity LYMPH:  no palpable lymphadenopathy in the cervical, axillary or inguinal LUNGS: clear to auscultation and percussion with normal breathing effort HEART: Occasional additional heart beat is noted but in general it is regular and moderate bilateral lower extremity edema ABDOMEN:abdomen soft, non-tender and normal bowel sounds Musculoskeletal:no cyanosis of digits and no clubbing  NEURO: alert & oriented x 3 with fluent speech, no focal motor/sensory deficits   Labs:  Recent Labs    05/01/19 1321  05/15/19 1011 05/22/19 1951 05/23/19 0055 05/24/19 0538  NA 132*   < > 134* 135 139 140  K 2.8*   < > 4.1 3.7 3.5 2.7*  CL 92*   < > 96* 97* 103 105  CO2 30   < > 31 27 27 27   GLUCOSE 137*   < > 173* 125* 125* 97  BUN 9   < > 19 15 12 9   CREATININE 0.65   < > 0.72 0.59 0.48  0.49  CALCIUM 8.6*   < > 9.0 8.4* 8.4* 8.1*  GFRNONAA >60   < > >60 >60 >60 >60  GFRAA >60   < > >60 >60 >60 >60  PROT 5.6*   < > 6.0* 5.4* 5.4*  --   ALBUMIN 3.0*   < > 2.8* 2.7* 2.7*  --   AST 44*   < > 41 30 33  --   ALT 28   < > 37 26 24  --   ALKPHOS 119   < > 245* 199* 180*  --   BILITOT 0.8   < > 0.4 0.5 0.9  --   BILIDIR 0.3*  --   --   --   --   --   IBILI 0.5  --   --   --   --   --    < > = values in this interval not displayed.    Studies:  Ct Head Wo Contrast  Result Date: 05/22/2019 CLINICAL DATA:  78 year old female with  increased weakness and fever. 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 EXAM: CT HEAD WITHOUT CONTRAST TECHNIQUE: Contiguous axial images were obtained from the base of the skull through the vertex without intravenous contrast. COMPARISON:  Head and cervical spine CT 05/01/2019 and earlier. FINDINGS: Brain: Stable cerebral volume. Stable gray-white matter differentiation throughout the brain. No midline shift, ventriculomegaly, mass effect, evidence of mass lesion, intracranial hemorrhage or evidence of cortically based acute infarction. No cortical encephalomalacia. Vascular: Calcified atherosclerosis at the skull base. No suspicious intracranial vascular hyperdensity. Skull: Stable. No acute or suspicious osseous lesion. Sinuses/Orbits: Visualized paranasal sinuses and mastoids are stable and well pneumatized. Other: Visualized orbits and scalp soft tissues are within normal limits. IMPRESSION: Stable and negative for age noncontrast CT appearance of the brain. Note that early metastatic disease to the brain cannot be excluded in the absence of intravenous contrast. Electronically Signed   By: Genevie Ann M.D.   On: 05/22/2019 23:32   Ct Head Wo Contrast  Result Date: 05/01/2019 CLINICAL DATA:  Posttraumatic headache after fall today. EXAM: CT HEAD WITHOUT CONTRAST CT CERVICAL SPINE WITHOUT CONTRAST TECHNIQUE: Multidetector CT imaging of the head and cervical spine was performed following the standard protocol without intravenous contrast. Multiplanar CT image reconstructions of the cervical spine were also generated. COMPARISON:  None. FINDINGS: CT HEAD FINDINGS Brain: Mild diffuse cortical atrophy is noted. Mild chronic ischemic white matter disease is noted. No mass effect or midline shift is noted. Ventricular size is within normal limits. There is no evidence of mass lesion, hemorrhage or acute infarction. Vascular: No hyperdense vessel or unexpected calcification. Skull: Normal. Negative for  fracture or focal lesion. Sinuses/Orbits: No acute finding. Other: None. CT CERVICAL SPINE FINDINGS Alignment: Minimal grade 1 anterolisthesis of C4-5 is noted secondary to posterior facet joint hypertrophy. Skull base and vertebrae: No acute fracture. No primary bone lesion or focal pathologic process. Soft tissues and spinal canal: No prevertebral fluid or swelling. No visible canal hematoma. Disc levels: Mild degenerative disc disease is noted at C5-6 with anterior posterior osteophyte formation. There appears to be fusion of the C6-7 disc space most likely due to degenerative change. Upper chest: Negative. Other: Degenerative changes are seen involving posterior facet joints bilaterally. IMPRESSION: Mild diffuse cortical atrophy. Mild chronic ischemic white matter disease. No acute intracranial abnormality seen. Multilevel degenerative disc disease. No acute abnormality seen in the cervical spine. Electronically Signed   By: Bobbe Medico.D.  On: 05/01/2019 15:17   Ct Cervical Spine Wo Contrast  Result Date: 05/01/2019 CLINICAL DATA:  Posttraumatic headache after fall today. EXAM: CT HEAD WITHOUT CONTRAST CT CERVICAL SPINE WITHOUT CONTRAST TECHNIQUE: Multidetector CT imaging of the head and cervical spine was performed following the standard protocol without intravenous contrast. Multiplanar CT image reconstructions of the cervical spine were also generated. COMPARISON:  None. FINDINGS: CT HEAD FINDINGS Brain: Mild diffuse cortical atrophy is noted. Mild chronic ischemic white matter disease is noted. No mass effect or midline shift is noted. Ventricular size is within normal limits. There is no evidence of mass lesion, hemorrhage or acute infarction. Vascular: No hyperdense vessel or unexpected calcification. Skull: Normal. Negative for fracture or focal lesion. Sinuses/Orbits: No acute finding. Other: None. CT CERVICAL SPINE FINDINGS Alignment: Minimal grade 1 anterolisthesis of C4-5 is noted secondary  to posterior facet joint hypertrophy. Skull base and vertebrae: No acute fracture. No primary bone lesion or focal pathologic process. Soft tissues and spinal canal: No prevertebral fluid or swelling. No visible canal hematoma. Disc levels: Mild degenerative disc disease is noted at C5-6 with anterior posterior osteophyte formation. There appears to be fusion of the C6-7 disc space most likely due to degenerative change. Upper chest: Negative. Other: Degenerative changes are seen involving posterior facet joints bilaterally. IMPRESSION: Mild diffuse cortical atrophy. Mild chronic ischemic white matter disease. No acute intracranial abnormality seen. Multilevel degenerative disc disease. No acute abnormality seen in the cervical spine. Electronically Signed   By: Marijo Conception M.D.   On: 05/01/2019 15:17   Dg Chest Port 1 View  Result Date: 05/22/2019 CLINICAL DATA:  78 y/o  F; sepsis. EXAM: PORTABLE CHEST 1 VIEW COMPARISON:  12/05/2018 CT of the chest. FINDINGS: Normal cardiac silhouette. Aortic atherosclerosis with calcification. Right port catheter tip projects over lower SVC. Streaky opacity at the right lung base. No pleural effusion or pneumothorax. No acute osseous abnormality is evident. IMPRESSION: Streaky opacity at the right lung base may represent atelectasis or pneumonia. Electronically Signed   By: Kristine Garbe M.D.   On: 05/22/2019 20:45

## 2019-05-24 NOTE — Progress Notes (Signed)
Inpatient Diabetes Program Recommendations  AACE/ADA: New Consensus Statement on Inpatient Glycemic Control (2015)  Target Ranges:  Prepandial:   less than 140 mg/dL      Peak postprandial:   less than 180 mg/dL (1-2 hours)      Critically ill patients:  140 - 180 mg/dL   Lab Results  Component Value Date   GLUCAP 111 (H) 05/24/2019   HGBA1C 7.4 (H) 05/23/2019    Review of Glycemic Control Results for Tammy Hensley, Tammy Hensley (MRN [id number]) as of 05/24/2019 10:45  Ref. Range 05/23/2019 15:55 05/23/2019 19:50 05/23/2019 23:45 05/24/2019 04:21 05/24/2019 07:32  Glucose-Capillary Latest Ref Range: 70 - 99 mg/dL 161 (H) 144 (H) 265 (H) 98 111 (H)   Diabetes history: DM 2 Outpatient Diabetes medications: Metformin 500 mg bid Current orders for Inpatient glycemic control:  Novolog sensitive q 4 hours  Inpatient Diabetes Program Recommendations:    Note that patient is on PO diet.  Consider changing Novolog correction to tid with meals and HS scale.   Thanks  Adah Perl, RN, BC-ADM Inpatient Diabetes Coordinator Pager [phone]

## 2019-05-24 NOTE — Evaluation (Signed)
Occupational Therapy Evaluation Patient Details Name: Tammy Hensley MRN: [id number] DOB: 1941-02-07 Today's Date: 05/24/2019    History of Present Illness DARIYA GAINER is a 78 y.o. 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  Anemia, DM 2, HTN, HLD, Hypothyroidism. Pt presents with general fatigue and fever.   Clinical Impression   Pt admitted with fatigue and fever. Pt currently with functional limitations due to the deficits listed below (see OT Problem List).  Pt will benefit from skilled OT to increase their safety and independence with ADL and functional mobility for ADL to facilitate discharge to venue listed below.      Follow Up Recommendations  Home health OT;Supervision/Assistance - 24 hour    Equipment Recommendations  None recommended by OT    Recommendations for Other Services       Precautions / Restrictions Precautions Precautions: Fall Precaution Comments: previous fall at home Restrictions Weight Bearing Restrictions: No Other Position/Activity Restrictions: WBAT      Mobility Bed Mobility Overal bed mobility: Needs Assistance Bed Mobility: Supine to Sit     Supine to sit: Supervision;HOB elevated     General bed mobility comments: cues for technique  Transfers Overall transfer level: Needs assistance Equipment used: Rolling walker (2 wheeled) Transfers: Sit to/from Stand Sit to Stand: Supervision         General transfer comment: cues for hand placement for increased safety    Balance Overall balance assessment: Mild deficits observed, not formally tested                                         ADL either performed or assessed with clinical judgement   ADL Overall ADL's : Needs assistance/impaired Eating/Feeding: Set up;Sitting   Grooming: Set up;Sitting   Upper Body Bathing: Minimal assistance;Sitting   Lower Body Bathing: Maximal assistance;Sit to/from  stand;Cueing for safety;With adaptive equipment   Upper Body Dressing : Minimal assistance;Sitting   Lower Body Dressing: Cueing for safety;Cueing for compensatory techniques;Cueing for sequencing;Maximal assistance   Toilet Transfer: Minimal assistance;Ambulation;Cueing for safety   Toileting- Clothing Manipulation and Hygiene: Minimal assistance;Sit to/from stand;Cueing for safety;Cueing for sequencing       Functional mobility during ADLs: Minimal assistance       Vision Patient Visual Report: No change from baseline              Pertinent Vitals/Pain Pain Assessment: No/denies pain     Hand Dominance     Extremity/Trunk Assessment Upper Extremity Assessment Upper Extremity Assessment: Generalized weakness   Lower Extremity Assessment Lower Extremity Assessment: Overall WFL for tasks assessed   Cervical / Trunk Assessment Cervical / Trunk Assessment: Normal   Communication Communication Communication: No difficulties   Cognition Arousal/Alertness: Awake/alert Behavior During Therapy: Flat affect Overall Cognitive Status: Within Functional Limits for tasks assessed                                 General Comments: Bilateral LE edema   General Comments               Home Living Family/patient expects to be discharged to:: Private residence Living Arrangements: Spouse/significant other Available Help at Discharge: Family Type of Home: House Home Access: Stairs to enter Technical brewer of Steps: 3-4 Entrance Stairs-Rails: Can reach both Home Layout:  One level     Bathroom Shower/Tub: Walk-in shower         Home Equipment: Environmental consultant - 2 wheels          Prior Functioning/Environment Level of Independence: Independent with assistive device(s)                 OT Problem List: Decreased strength;Decreased activity tolerance;Impaired balance (sitting and/or standing)      OT Treatment/Interventions: Self-care/ADL  training;Patient/family education;DME and/or AE instruction    OT Goals(Current goals can be found in the care plan section) Acute Rehab OT Goals Patient Stated Goal: To walk and return home with therapy OT Goal Formulation: With patient Time For Goal Achievement: 06/07/19 Potential to Achieve Goals: Good  OT Frequency: Min 2X/week           Co-evaluation   Reason for Co-Treatment: Other (comment)(energy conservation/fatigue) PT goals addressed during session: Mobility/safety with mobility;Proper use of DME;Strengthening/ROM;Balance        AM-PAC OT "6 Clicks" Daily Activity     Outcome Measure Help from another person eating meals?: None Help from another person taking care of personal grooming?: A Little Help from another person toileting, which includes using toliet, bedpan, or urinal?: A Little Help from another person bathing (including washing, rinsing, drying)?: A Little Help from another person to put on and taking off regular upper body clothing?: None Help from another person to put on and taking off regular lower body clothing?: A Little 6 Click Score: 20   End of Session Equipment Utilized During Treatment: Gait belt;Rolling walker Nurse Communication: Mobility status  Activity Tolerance: Patient tolerated treatment well Patient left: in chair;with call bell/phone within reach  OT Visit Diagnosis: Unsteadiness on feet (R26.81);Repeated falls (R29.6);Muscle weakness (generalized) (M62.81);History of falling (Z91.81)                Time: 5027-7412 OT Time Calculation (min): 22 min Charges:  OT General Charges $OT Visit: 1 Visit OT Evaluation $OT Eval Moderate Complexity: 1 Mod  Kari Baars, [place] Pager450 [phone] Office- [phone]     [place] Bryngelson, Edwena Felty D 05/24/2019, 1:30 PM

## 2019-05-24 NOTE — Evaluation (Signed)
Physical Therapy Evaluation Patient Details Name: Tammy Hensley MRN: [id number] DOB: [birth year] Today's Date: 05/24/2019   History of Present Illness  Tammy Hensley is a 78 y.o. 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  Anemia, DM 2, HTN, HLD, Hypothyroidism. Pt presents with general fatigue and fever.  Clinical Impression  Pt presents with general fatigue and decreased mobility secondary to the above diagnosis. Pt reports her husband can assist as needed. Pt will benefit from acute skilled PT to maximize mobility and improve independence for d/c home with HHPT.     Follow Up Recommendations Home health PT    Equipment Recommendations  None recommended by PT    Recommendations for Other Services       Precautions / Restrictions Precautions Precautions: Fall Precaution Comments: previous fall at home Restrictions Weight Bearing Restrictions: No Other Position/Activity Restrictions: WBAT      Mobility  Bed Mobility Overal bed mobility: Needs Assistance Bed Mobility: Supine to Sit     Supine to sit: Supervision;HOB elevated     General bed mobility comments: cues for technique  Transfers Overall transfer level: Needs assistance Equipment used: Rolling walker (2 wheeled) Transfers: Sit to/from Stand Sit to Stand: Supervision         General transfer comment: cues for hand placement for increased safety  Ambulation/Gait Ambulation/Gait assistance: Min guard   Assistive device: Rolling walker (2 wheeled) Gait Pattern/deviations: Step-through pattern;Decreased stride length Gait velocity: decreased   General Gait Details: fatigues with activity, good safety with RW  Stairs            Wheelchair Mobility    Modified Rankin (Stroke Patients Only)       Balance Overall balance assessment: Mild deficits observed, not formally tested                                            Pertinent Vitals/Pain Pain Assessment: No/denies pain    Home Living Family/patient expects to be discharged to:: Private residence Living Arrangements: Spouse/significant other Available Help at Discharge: Family Type of Home: House Home Access: Stairs to enter Entrance Stairs-Rails: Can reach both Entrance Stairs-Number of Steps: 3-4 Home Layout: One level Home Equipment: Environmental consultant - 2 wheels      Prior Function Level of Independence: Independent with assistive device(s)               Hand Dominance        Extremity/Trunk Assessment   Upper Extremity Assessment Upper Extremity Assessment: Defer to OT evaluation    Lower Extremity Assessment Lower Extremity Assessment: Overall WFL for tasks assessed    Cervical / Trunk Assessment Cervical / Trunk Assessment: Normal  Communication      Cognition Arousal/Alertness: Awake/alert Behavior During Therapy: Flat affect Overall Cognitive Status: Within Functional Limits for tasks assessed                                 General Comments: Bilateral LE edema      General Comments      Exercises     Assessment/Plan    PT Assessment Patient needs continued PT services  PT Problem List Decreased strength;Decreased mobility;Decreased safety awareness;Decreased knowledge of precautions;Decreased activity tolerance;Decreased balance       PT Treatment Interventions DME instruction;Therapeutic activities;Gait training;Therapeutic  exercise;Patient/family education;Stair training;Balance training;Functional mobility training;Neuromuscular re-education    PT Goals (Current goals can be found in the Care Plan section)  Acute Rehab PT Goals Patient Stated Goal: To walk and return home with therapy PT Goal Formulation: With patient Time For Goal Achievement: 06/07/19 Potential to Achieve Goals: Good    Frequency Min 3X/week   Barriers to discharge        Co-evaluation PT/OT/SLP  Co-Evaluation/Treatment: Yes Reason for Co-Treatment: Other (comment)(energy conservation/fatigue) PT goals addressed during session: Mobility/safety with mobility;Proper use of DME;Strengthening/ROM;Balance         AM-PAC PT "6 Clicks" Mobility  Outcome Measure Help needed turning from your back to your side while in a flat bed without using bedrails?: A Little Help needed moving from lying on your back to sitting on the side of a flat bed without using bedrails?: A Little Help needed moving to and from a bed to a chair (including a wheelchair)?: A Little Help needed standing up from a chair using your arms (e.g., wheelchair or bedside chair)?: A Little Help needed to walk in hospital room?: A Little Help needed climbing 3-5 steps with a railing? : A Little 6 Click Score: 18    End of Session Equipment Utilized During Treatment: Gait belt Activity Tolerance: Patient tolerated treatment well Patient left: in chair;with call bell/phone within reach Nurse Communication: Mobility status PT Visit Diagnosis: Other abnormalities of gait and mobility (R26.89)    Time: 1040-1105 PT Time Calculation (min) (ACUTE ONLY): 25 min   Charges:   PT Evaluation $PT Eval Low Complexity: Funk, PT  [place], Ishmael Holter [place] 05/24/2019, 11:14 AM

## 2019-05-24 NOTE — Progress Notes (Signed)
Initial Nutrition Assessment  RD working remotely.   DOCUMENTATION CODES:   (unable to assess for malnutrition at this time.)  INTERVENTION:  - will order Ensure Enlive po BID, each supplement provides 350 kcal and 20 grams of protein - will order daily multivitamin with minerals. - continue to encourage PO intakes. - will continue to monitor for additional nutrition needs.   NUTRITION DIAGNOSIS:   Increased nutrient needs related to chronic illness, cancer and cancer related treatments as evidenced by estimated needs.  GOAL:   Patient will meet greater than or equal to 90% of their needs  MONITOR:   PO intake, Supplement acceptance, Labs, Weight trends  REASON FOR ASSESSMENT:   Consult Assessment of nutrition requirement/status  ASSESSMENT:   78 year old female with medical history which includes metastatic endometrial cancer who presented to the ED with generalized increased fatigue and fever that started the day of presentation. Patient experienced a fall with hitting her face 4 days PTA. Patient was recently admitted (6/8-6/10) for fever and weakness and was subsequently dx with neutropenic fever.  Current weigh is 147 lb and weight is the highest it has been since February. No meal intakes have been recorded since admission on 6/29. Unable to determine if patient meets criteria for malnutrition at this time but will continue to update as able.   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   Medications reviewed; sliding scale novolog, 44 mcg oral synthroid/day, 1 tablet senokot BID. Labs reviewed; CBGs: 111 and 157 mg/dl, K: 3.3 mmol/l, Ca: 8.1 mg/dl.       NUTRITION - FOCUSED PHYSICAL EXAM:  unable to complete at this time.   Diet Order:   Diet Order            Diet Carb Modified Fluid  consistency: Thin; Room service appropriate? Yes  Diet effective now              EDUCATION NEEDS:   No education needs have been identified at this time  Skin:  Skin Assessment: Reviewed RN Assessment  Last BM:  6/30  Height:   Ht Readings from Last 1 Encounters:  05/23/19 5\' 2"  (1.575 m)    Weight:   Wt Readings from Last 1 Encounters:  05/23/19 66.9 kg    Ideal Body Weight:  50 kg  BMI:  Body mass index is 26.98 kg/m.  Estimated Nutritional Needs:   Kcal:  2010-2210 kcal  Protein:  95-110 grams  Fluid:  >/= 2 L/day      Jarome Matin, MS, RD, LDN, [facility] Inpatient Clinical Dietitian Pager # [phone] After hours/weekend pager # [phone]

## 2019-05-24 NOTE — Progress Notes (Signed)
CRITICAL VALUE ALERT  Critical Value:  K 2.7  Date & Time Notied:  05/24/19 at 0643  Provider Notified: Dr Hilbert Bible  Orders Received/Actions taken: Awaiting new orders.

## 2019-05-24 NOTE — Progress Notes (Signed)
[facility]                                                                              Patient Demographics  Tammy Hensley, is a 78 y.o. female, DOB - Jun 23, 1941, YKZ:[id number]  Admit date - 05/22/2019   Admitting Physician Toy Baker, MD  Outpatient Primary MD for the patient is [name], MD  Outpatient specialists:   LOS - 1  days   Medical records reviewed and are as summarized below:    Chief Complaint  Patient presents with   Weakness   Fever       Brief summary   Tammy Hensley is an 78 y.o. female Presented withgeneralized increased fatigue and fever started today recently had negative covid test. Recent fall on Thursday 4 days ago hitting her face. admitted to the hospital from the 8th-10th of June for fever and weakness and at that time was diagnosed with neutropenic fever and no source was noted at that time.   Assessment & Plan    Neutropenic fever, acquired -Patient had received chemotherapy recently for metastatic uterine cancer -Oncology was consulted, started on G-CSF since 6/30 -Patient was placed on IV broad-spectrum antibiotics, continue IV vancomycin and cefepime. -Discontinued azithromycin, abnormal infiltrate on chest x-ray possibly be atelectasis -Hemoglobin 8.2   Acute metabolic encephalopathy -Resolved, alert and oriented, likely due to #1  Severe hypokalemia -Potassium 2.7, replace aggressively, will recheck later today  Hypothyroidism Continue Synthroid  Hypertension BP soft but improving  Hyperlipidemia -Continue home medications  Metastatic endometrial CA -Recently received chemotherapy, oncology following  Anemia: Likely due to chemotherapy and anemia of chronic disease -Continue to monitor, H&H currently stable hemoglobin 8.2, does not need transfusion  Mechanical fall -PT evaluation done, recommended home health PT  Moderate protein calorie malnutrition Encouraged p.o.  diet  History of left breast CA,, ER PR positive Oncology following  Code Status DNR DVT Prophylaxis: SCDs Family Communication: Discussed in detail with the patient, all imaging results, lab results explained to the patient    Disposition Plan: Likely home in a.m. she feels uncomfortable going home today with generalized weakness, potassium of 2.7  Time Spent in minutes   25 minutes  Procedures:  None  Consultants:   Oncology, Dr. Alvy Bimler  Antimicrobials:   Anti-infectives (From admission, onward)   Start     Dose/Rate Route Frequency Ordered Stop   05/23/19 1800  vancomycin (VANCOCIN) IVPB 1000 mg/200 mL premix     1,000 mg 200 mL/hr over 60 Minutes Intravenous Every 24 hours 05/23/19 0527     05/23/19 0800  ceFEPIme (MAXIPIME) 2 g in sodium chloride 0.9 % 100 mL IVPB     2 g 200 mL/hr over 30 Minutes Intravenous Every 12 hours 05/23/19 0527     05/23/19 0030  azithromycin (ZITHROMAX) 500 mg in sodium chloride 0.9 % 250 mL IVPB  Status:  Discontinued     500 mg 250 mL/hr over 60 Minutes Intravenous Daily at bedtime 05/23/19 0026 05/24/19 0952   05/22/19 2000  ceFEPIme (MAXIPIME) 2 g in sodium chloride 0.9 % 100 mL IVPB     2 g 200  mL/hr over 30 Minutes Intravenous  Once 05/22/19 1951 05/22/19 2113   05/22/19 2000  metroNIDAZOLE (FLAGYL) IVPB 500 mg     500 mg 100 mL/hr over 60 Minutes Intravenous  Once 05/22/19 1951 05/22/19 2146   05/22/19 2000  vancomycin (VANCOCIN) IVPB 1000 mg/200 mL premix     1,000 mg 200 mL/hr over 60 Minutes Intravenous  Once 05/22/19 1951 05/22/19 2210          Medications  Scheduled Meds:  insulin aspart  0-5 Units Subcutaneous QHS   insulin aspart  0-9 Units Subcutaneous TID WC   levothyroxine  44 mcg Oral Once per day on Mon Wed Fri Sat   levothyroxine  88 mcg Oral Once per day on Sun Tue Thu   lidocaine-prilocaine   Topical Once   potassium chloride  40 mEq Oral Q4H   pravastatin  40 mg Oral QPM   senna-docusate  1  tablet Oral BID   Tbo-filgastrim (GRANIX) SQ  300 mcg Subcutaneous Daily   Continuous Infusions:  ceFEPime (MAXIPIME) IV 2 g (05/24/19 0812)   vancomycin 1,000 mg (05/23/19 1710)   PRN Meds:.acetaminophen **OR** acetaminophen, bisacodyl, HYDROcodone-acetaminophen, lip balm, ondansetron **OR** ondansetron (ZOFRAN) IV, polyethylene glycol, sodium chloride flush, traMADol, traZODone      Subjective:   Tammy Hensley was seen and examined today.  Better than yesterday however still feels weak, much more alert and oriented today.  No nausea or vomiting.  Low-grade temp overnight 100.7 F.  Patient denies dizziness, chest pain, shortness of breath, abdominal pain, N/V/D/C. No acute events overnight.    Objective:   Vitals:   05/23/19 2227 05/23/19 2333 05/24/19 0155 05/24/19 0453  BP: 113/67 (!) 162/73 (!) 114/58 112/67  Pulse: (!) 126 (!) 148 65 (!) 108  Resp: 20 20 18 18   Temp: 99.4 F (37.4 C) (!) 100.7 F (38.2 C) 99.6 F (37.6 C) 99.7 F (37.6 C)  TempSrc: Oral Oral Oral Oral  SpO2: 97% 99% 97% 97%  Weight:      Height:        Intake/Output Summary (Last 24 hours) at 05/24/2019 1131 Last data filed at 05/24/2019 0600 Gross per 24 hour  Intake 2520.76 ml  Output 2000 ml  Net 520.76 ml     Wt Readings from Last 3 Encounters:  05/23/19 66.9 kg  05/15/19 64.4 kg  05/01/19 64.4 kg     Exam  General: Alert and oriented x 3, NAD  Eyes:   HEENT:  Atraumatic, normocephalic, normal oropharynx  Cardiovascular: S1 S2 auscultated, Regular rate and rhythm.  Respiratory: Clear to auscultation bilaterally, no wheezing, rales or rhonchi  Gastrointestinal: Soft, nontender, nondistended, + bowel sounds  Ext: 1+ pedal edema bilaterally  Neuro: No new deficits  Musculoskeletal: No digital cyanosis, clubbing  Skin: No rashes  Psych: Normal affect and demeanor, alert and oriented x3    Data Reviewed:  I have personally reviewed following labs and imaging  studies  Micro Results Recent Results (from the past 240 hour(s))  Blood Culture (routine x 2)     Status: None (Preliminary result)   Collection Time: 05/22/19  7:51 PM   Specimen: BLOOD  Result Value Ref Range Status   Specimen Description   Final    BLOOD RIGHT ANTECUBITAL Performed at [facility] [address] [place], [place] [address]    Special Requests   Final    BOTTLES DRAWN AEROBIC AND ANAEROBIC Blood Culture adequate volume Performed at [facility], [address] [place], [place] [address]    Culture   Final    NO GROWTH 2 DAYS Performed at [facility], [place] [address], [place], [place] [address]    Report Status PENDING  Incomplete  SARS Coronavirus 2 (CEPHEID- Performed in [place] hospital lab), Hosp Order     Status: None   Collection Time: 05/22/19  7:52 PM   Specimen: Nasopharyngeal Swab  Result Value Ref Range Status   SARS Coronavirus 2 NEGATIVE NEGATIVE Final    Comment: (NOTE) If result is NEGATIVE SARS-CoV-2 target nucleic acids are NOT DETECTED. The SARS-CoV-2 RNA is generally detectable in upper and lower  respiratory specimens during the acute phase of infection. The lowest  concentration of SARS-CoV-2 viral copies this assay can detect is 250  copies / mL. A negative result does not preclude SARS-CoV-2 infection  and should not be used as the sole basis for treatment or other  patient management decisions.  A negative result may occur with  improper specimen collection / handling, submission of specimen other  than nasopharyngeal swab, presence of viral mutation(s) within the  areas targeted by this assay, and inadequate number of viral copies  (<250 copies / mL). A negative result must be combined with clinical  observations, patient history, and epidemiological information. If result is POSITIVE SARS-CoV-2 target nucleic acids are DETECTED. The SARS-CoV-2 RNA is generally detectable in upper  and lower  respiratory specimens dur ing the acute phase of infection.  Positive  results are indicative of active infection with SARS-CoV-2.  Clinical  correlation with patient history and other diagnostic information is  necessary to determine patient infection status.  Positive results do  not rule out bacterial infection or co-infection with other viruses. If result is PRESUMPTIVE POSTIVE SARS-CoV-2 nucleic acids MAY BE PRESENT.   A presumptive positive result was obtained on the submitted specimen  and confirmed on repeat testing.  While 2019 novel coronavirus  (SARS-CoV-2) nucleic acids may be present in the submitted sample  additional confirmatory testing may be necessary for epidemiological  and / or clinical management purposes  to differentiate between  SARS-CoV-2 and other Sarbecovirus currently known to infect humans.  If clinically indicated additional testing with an alternate test  methodology [phone]) is advised. The SARS-CoV-2 RNA is generally  detectable in upper and lower respiratory sp ecimens during the acute  phase of infection. The expected result is Negative. Fact Sheet for Patients:  StrictlyIdeas.no Fact Sheet for Healthcare Providers: BankingDealers.co.za This test is not yet approved or cleared by the [place] FDA and has been authorized for detection and/or diagnosis of SARS-CoV-2 by FDA under an Emergency Use Authorization (EUA).  This EUA will remain in effect (meaning this test can be used) for the duration of the COVID-19 declaration under Section 564(b)(1) of the Act, 21 U.S.C. section 360bbb-3(b)(1), unless the authorization is terminated or revoked sooner. Performed at [facility], [place] [address] [place], [place] [address]   Blood Culture (routine x 2)     Status: None (Preliminary result)   Collection Time: 05/22/19  7:56 PM   Specimen: BLOOD  Result Value Ref Range  Status   Specimen Description   Final    BLOOD BLOOD LEFT HAND Performed at [facility] [address] [place], [place] [address]    Special Requests   Final    BOTTLES DRAWN AEROBIC AND ANAEROBIC Blood Culture adequate volume Performed at [facility]  [address], [place], [place] [address]    Culture   Final    NO GROWTH 2 DAYS Performed at [facility] [address], [place], [place] [address]    Report Status PENDING  Incomplete  MRSA PCR Screening     Status: None   Collection Time: 05/23/19 10:11 AM   Specimen: Nasal Mucosa; Nasopharyngeal  Result Value Ref Range Status   MRSA by PCR NEGATIVE NEGATIVE Final    Comment:        The GeneXpert MRSA Assay (FDA approved for NASAL specimens only), is one component of a comprehensive MRSA colonization surveillance program. It is not intended to diagnose MRSA infection nor to guide or monitor treatment for MRSA infections. Performed at [facility], [place] [address] [place], [place] [address]     Radiology Reports Ct Head Wo Contrast  Result Date: 05/22/2019 CLINICAL DATA:  78 year old female with increased weakness and fever. 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 EXAM: CT HEAD WITHOUT CONTRAST TECHNIQUE: Contiguous axial images were obtained from the base of the skull through the vertex without intravenous contrast. COMPARISON:  Head and cervical spine CT 05/01/2019 and earlier. FINDINGS: Brain: Stable cerebral volume. Stable gray-white matter differentiation throughout the brain. No midline shift, ventriculomegaly, mass effect, evidence of mass lesion, intracranial hemorrhage or evidence of cortically based acute infarction. No cortical encephalomalacia. Vascular: Calcified atherosclerosis at the skull base. No suspicious intracranial vascular hyperdensity. Skull: Stable. No acute or suspicious osseous lesion. Sinuses/Orbits: Visualized  paranasal sinuses and mastoids are stable and well pneumatized. Other: Visualized orbits and scalp soft tissues are within normal limits. IMPRESSION: Stable and negative for age noncontrast CT appearance of the brain. Note that early metastatic disease to the brain cannot be excluded in the absence of intravenous contrast. Electronically Signed   By: Genevie Ann M.D.   On: 05/22/2019 23:32   Ct Head Wo Contrast  Result Date: 05/01/2019 CLINICAL DATA:  Posttraumatic headache after fall today. EXAM: CT HEAD WITHOUT CONTRAST CT CERVICAL SPINE WITHOUT CONTRAST TECHNIQUE: Multidetector CT imaging of the head and cervical spine was performed following the standard protocol without intravenous contrast. Multiplanar CT image reconstructions of the cervical spine were also generated. COMPARISON:  None. FINDINGS: CT HEAD FINDINGS Brain: Mild diffuse cortical atrophy is noted. Mild chronic ischemic white matter disease is noted. No mass effect or midline shift is noted. Ventricular size is within normal limits. There is no evidence of mass lesion, hemorrhage or acute infarction. Vascular: No hyperdense vessel or unexpected calcification. Skull: Normal. Negative for fracture or focal lesion. Sinuses/Orbits: No acute finding. Other: None. CT CERVICAL SPINE FINDINGS Alignment: Minimal grade 1 anterolisthesis of C4-5 is noted secondary to posterior facet joint hypertrophy. Skull base and vertebrae: No acute fracture. No primary bone lesion or focal pathologic process. Soft tissues and spinal canal: No prevertebral fluid or swelling. No visible canal hematoma. Disc levels: Mild degenerative disc disease is noted at C5-6 with anterior posterior osteophyte formation. There appears to be fusion of the C6-7 disc space most likely due to degenerative change. Upper chest: Negative. Other: Degenerative changes are seen involving posterior facet joints bilaterally. IMPRESSION: Mild diffuse cortical atrophy. Mild chronic ischemic white  matter disease. No acute intracranial abnormality seen. Multilevel degenerative disc disease. No acute abnormality seen in the cervical spine. Electronically Signed   By: Marijo Conception M.D.   On: 05/01/2019 15:17   Ct Cervical Spine Wo Contrast  Result Date: 05/01/2019 CLINICAL DATA:  Posttraumatic  headache after fall today. EXAM: CT HEAD WITHOUT CONTRAST CT CERVICAL SPINE WITHOUT CONTRAST TECHNIQUE: Multidetector CT imaging of the head and cervical spine was performed following the standard protocol without intravenous contrast. Multiplanar CT image reconstructions of the cervical spine were also generated. COMPARISON:  None. FINDINGS: CT HEAD FINDINGS Brain: Mild diffuse cortical atrophy is noted. Mild chronic ischemic white matter disease is noted. No mass effect or midline shift is noted. Ventricular size is within normal limits. There is no evidence of mass lesion, hemorrhage or acute infarction. Vascular: No hyperdense vessel or unexpected calcification. Skull: Normal. Negative for fracture or focal lesion. Sinuses/Orbits: No acute finding. Other: None. CT CERVICAL SPINE FINDINGS Alignment: Minimal grade 1 anterolisthesis of C4-5 is noted secondary to posterior facet joint hypertrophy. Skull base and vertebrae: No acute fracture. No primary bone lesion or focal pathologic process. Soft tissues and spinal canal: No prevertebral fluid or swelling. No visible canal hematoma. Disc levels: Mild degenerative disc disease is noted at C5-6 with anterior posterior osteophyte formation. There appears to be fusion of the C6-7 disc space most likely due to degenerative change. Upper chest: Negative. Other: Degenerative changes are seen involving posterior facet joints bilaterally. IMPRESSION: Mild diffuse cortical atrophy. Mild chronic ischemic white matter disease. No acute intracranial abnormality seen. Multilevel degenerative disc disease. No acute abnormality seen in the cervical spine. Electronically Signed    By: Marijo Conception M.D.   On: 05/01/2019 15:17   Dg Chest Port 1 View  Result Date: 05/22/2019 CLINICAL DATA:  78 y/o  F; sepsis. EXAM: PORTABLE CHEST 1 VIEW COMPARISON:  12/05/2018 CT of the chest. FINDINGS: Normal cardiac silhouette. Aortic atherosclerosis with calcification. Right port catheter tip projects over lower SVC. Streaky opacity at the right lung base. No pleural effusion or pneumothorax. No acute osseous abnormality is evident. IMPRESSION: Streaky opacity at the right lung base may represent atelectasis or pneumonia. Electronically Signed   By: Kristine Garbe M.D.   On: 05/22/2019 20:45    Lab Data:  CBC: Recent Labs  Lab 05/22/19 1951 05/23/19 0055 05/24/19 0538  WBC 0.8* 0.6* 0.7*  NEUTROABS 0.3*  --  0.3*  HGB 9.0* 9.1* 8.2*  HCT 28.3* 30.1* 25.7*  MCV 78.8* 80.5 78.1*  PLT 403* 345 542   Basic Metabolic Panel: Recent Labs  Lab 05/22/19 1951 05/23/19 0055 05/24/19 0538  NA 135 139 140  K 3.7 3.5 2.7*  CL 97* 103 105  CO2 27 27 27   GLUCOSE 125* 125* 97  BUN 15 12 9   CREATININE 0.59 0.48 0.49  CALCIUM 8.4* 8.4* 8.1*  MG  --  1.8 1.7  PHOS  --  3.8  --    GFR: Estimated Creatinine Clearance: 52.8 mL/min (by C-G formula based on SCr of 0.49 mg/dL). Liver Function Tests: Recent Labs  Lab 05/22/19 1951 05/23/19 0055  AST 30 33  ALT 26 24  ALKPHOS 199* 180*  BILITOT 0.5 0.9  PROT 5.4* 5.4*  ALBUMIN 2.7* 2.7*   No results for input(s): LIPASE, AMYLASE in the last 168 hours. No results for input(s): AMMONIA in the last 168 hours. Coagulation Profile: No results for input(s): INR, PROTIME in the last 168 hours. Cardiac Enzymes: No results for input(s): CKTOTAL, CKMB, CKMBINDEX, TROPONINI in the last 168 hours. BNP (last 3 results) No results for input(s): PROBNP in the last 8760 hours. HbA1C: Recent Labs    05/23/19 0055  HGBA1C 7.4*   CBG: Recent Labs  Lab 05/23/19 1555 05/23/19 1950 05/23/19  2345 05/24/19 0421 05/24/19 0732   GLUCAP 161* 144* 265* 98 111*   Lipid Profile: No results for input(s): CHOL, HDL, LDLCALC, TRIG, CHOLHDL, LDLDIRECT in the last 72 hours. Thyroid Function Tests: Recent Labs    05/23/19 0055  TSH 1.053   Anemia Panel: No results for input(s): VITAMINB12, FOLATE, FERRITIN, TIBC, IRON, RETICCTPCT in the last 72 hours. Urine analysis:    Component Value Date/Time   COLORURINE YELLOW 05/22/2019 2216   [facility] 05/22/2019 2216   LABSPEC 1.012 05/22/2019 2216   PHURINE 7.0 05/22/2019 2216   GLUCOSEU NEGATIVE 05/22/2019 2216   HGBUR NEGATIVE 05/22/2019 2216   BILIRUBINUR NEGATIVE 05/22/2019 2216   KETONESUR NEGATIVE 05/22/2019 2216   PROTEINUR NEGATIVE 05/22/2019 2216   UROBILINOGEN 0.2 10/30/2014 1402   NITRITE NEGATIVE 05/22/2019 2216   LEUKOCYTESUR NEGATIVE 05/22/2019 2216     Hollie Bartus M.D. [facility] 05/24/2019, 11:31 AM  Pager: [phone] Between 7am to 7pm - call Pager - [phone]  After 7pm go to [email or web address] - password [email or web address]  Call night coverage person covering after 7pm

## 2019-05-25 ENCOUNTER — Encounter (HOSPITAL_COMMUNITY): Payer: Self-pay

## 2019-05-25 ENCOUNTER — Inpatient Hospital Stay (HOSPITAL_COMMUNITY): Payer: Medicare Other

## 2019-05-25 LAB — CBC WITH DIFFERENTIAL/PLATELET
Abs Immature Granulocytes: 0.01 10*3/uL (ref 0.00–0.07)
Basophils Absolute: 0.1 10*3/uL (ref 0.0–0.1)
Basophils Relative: 3 %
Eosinophils Absolute: 0 10*3/uL (ref 0.0–0.5)
Eosinophils Relative: 1 %
HCT: 25.2 % — ABNORMAL LOW (ref 36.0–46.0)
Hemoglobin: 7.8 g/dL — ABNORMAL LOW (ref 12.0–15.0)
Immature Granulocytes: 1 %
Lymphocytes Relative: 15 %
Lymphs Abs: 0.3 10*3/uL — ABNORMAL LOW (ref 0.7–4.0)
MCH: 24.5 pg — ABNORMAL LOW (ref 26.0–34.0)
MCHC: 31 g/dL (ref 30.0–36.0)
MCV: 79.2 fL — ABNORMAL LOW (ref 80.0–100.0)
Monocytes Absolute: 0.3 10*3/uL (ref 0.1–1.0)
Monocytes Relative: 15 %
Neutro Abs: 1.3 10*3/uL — ABNORMAL LOW (ref 1.7–7.7)
Neutrophils Relative %: 65 %
Platelets: 332 10*3/uL (ref 150–400)
RBC: 3.18 MIL/uL — ABNORMAL LOW (ref 3.87–5.11)
RDW: 21.2 % — ABNORMAL HIGH (ref 11.5–15.5)
WBC: 1.9 10*3/uL — ABNORMAL LOW (ref 4.0–10.5)
nRBC: 6.2 % — ABNORMAL HIGH (ref 0.0–0.2)

## 2019-05-25 LAB — BASIC METABOLIC PANEL
Anion gap: 7 (ref 5–15)
BUN: 8 mg/dL (ref 8–23)
CO2: 27 mmol/L (ref 22–32)
Calcium: 8.3 mg/dL — ABNORMAL LOW (ref 8.9–10.3)
Chloride: 103 mmol/L (ref 98–111)
Creatinine, Ser: 0.52 mg/dL (ref 0.44–1.00)
GFR calc Af Amer: >60 mL/min (ref >60)
GFR calc non Af Amer: >60 mL/min (ref >60)
Glucose, Bld: 176 mg/dL — ABNORMAL HIGH (ref 70–99)
Potassium: 3.4 mmol/L — ABNORMAL LOW (ref 3.5–5.1)
Sodium: 137 mmol/L (ref 135–145)

## 2019-05-25 LAB — GLUCOSE, CAPILLARY
Glucose-Capillary: 118 mg/dL — ABNORMAL HIGH (ref 70–99)
Glucose-Capillary: 191 mg/dL — ABNORMAL HIGH (ref 70–99)
Glucose-Capillary: 236 mg/dL — ABNORMAL HIGH (ref 70–99)
Glucose-Capillary: 221 mg/dL — ABNORMAL HIGH (ref 70–99)

## 2019-05-25 LAB — PREPARE RBC (CROSSMATCH)

## 2019-05-25 LAB — VANCOMYCIN, RANDOM: Vancomycin Rm: 4

## 2019-05-25 LAB — MAGNESIUM: Magnesium: 1.6 mg/dL — ABNORMAL LOW (ref 1.7–2.4)

## 2019-05-25 MED ORDER — POTASSIUM CHLORIDE CRYS ER 20 MEQ PO TBCR
40.0000 meq | EXTENDED_RELEASE_TABLET | Freq: Once | ORAL | Status: AC
  Administered 2019-05-25: 40 meq via ORAL
  Filled 2019-05-25: qty 2

## 2019-05-25 MED ORDER — SODIUM CHLORIDE 0.9% IV SOLUTION
Freq: Once | INTRAVENOUS | Status: AC
  Administered 2019-05-25: 1 mL via INTRAVENOUS

## 2019-05-25 MED ORDER — MAGNESIUM SULFATE 50 % IJ SOLN
3.0000 g | Freq: Once | INTRAVENOUS | Status: AC
  Administered 2019-05-25: 3 g via INTRAVENOUS
  Filled 2019-05-25: qty 6

## 2019-05-25 MED ORDER — SODIUM CHLORIDE (PF) 0.9 % IJ SOLN
INTRAMUSCULAR | Status: AC
  Filled 2019-05-25: qty 50

## 2019-05-25 MED ORDER — DIPHENHYDRAMINE HCL 50 MG PO CAPS
50.0000 mg | ORAL_CAPSULE | Freq: Once | ORAL | Status: AC
  Administered 2019-05-25: 50 mg via ORAL
  Filled 2019-05-25: qty 1

## 2019-05-25 MED ORDER — VANCOMYCIN HCL IN DEXTROSE 1-5 GM/200ML-% IV SOLN
1000.0000 mg | Freq: Two times a day (BID) | INTRAVENOUS | Status: DC
  Administered 2019-05-25 – 2019-05-26 (×2): 1000 mg via INTRAVENOUS
  Filled 2019-05-25: qty 200

## 2019-05-25 MED ORDER — PREDNISONE 50 MG PO TABS
50.0000 mg | ORAL_TABLET | Freq: Once | ORAL | Status: AC
  Administered 2019-05-25: 50 mg via ORAL
  Filled 2019-05-25: qty 1

## 2019-05-25 MED ORDER — DIPHENHYDRAMINE HCL 50 MG/ML IJ SOLN
50.0000 mg | Freq: Once | INTRAMUSCULAR | Status: AC
  Filled 2019-05-25: qty 1

## 2019-05-25 MED ORDER — IOHEXOL 300 MG/ML  SOLN
100.0000 mL | Freq: Once | INTRAMUSCULAR | Status: AC | PRN
  Administered 2019-05-25: 100 mL via INTRAVENOUS

## 2019-05-25 NOTE — Plan of Care (Signed)

## 2019-05-25 NOTE — Care Management Important Message (Signed)
Important Message  Patient Details IM Letter given to Dessa Phi RN to present to the Patient Name: Tammy Hensley MRN: [id number] Date of Birth: 06-19-1941   Medicare Important Message Given:  Yes     Kerin Salen 05/25/2019, 11:41 AM

## 2019-05-25 NOTE — Progress Notes (Signed)
[facility]                                                                              Patient Demographics  Tammy Hensley, is a 78 y.o. female, DOB - [birth year], YKD:[id number]  Admit date - 05/22/2019   Admitting Physician Toy Baker, MD  Outpatient Primary MD for the patient is [name], MD  Outpatient specialists:   LOS - 2  days   Medical records reviewed and are as summarized below:    Chief Complaint  Patient presents with   Weakness   Fever       Brief summary   Tammy Hensley is an 78 y.o. female Presented withgeneralized increased fatigue and fever started today recently had negative covid test. Recent fall on Thursday 4 days ago hitting her face. admitted to the hospital from the 8th-10th of June for fever and weakness and at that time was diagnosed with neutropenic fever and no source was noted at that time.   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 -Discontinued azithromycin, abnormal infiltrate on chest x-ray possibly atelectasis -Continues to spike fevers, overnight 102.1 F.  Blood cultures 7/1 negative so far, fungus culture also neg  - d/w Dr Elson Areas, recommended CT abdomen and pelvis with contrast (pre-medicating due to contrast allergy), recent CT abdomen had shown increased liver mets, lung mets and patient was started on new chemotherapy.  Per oncology, sometimes liver necrosis/liver mets can also cause tumor fever.  If CT abdomen is not convincing, recommended 2D echo to rule out infective endocarditis   Acute metabolic encephalopathy -Resolved, currently alert and oriented, at baseline  Hypokalemia, hypomagnesemia Replaced IV medium, oral potassium 40  meq x1  Metastatic endometrial CA -Recently received chemotherapy, oncology following  Anemia: Likely due to chemotherapy and anemia of chronic disease -Hemoglobin 7.8 today, oncology recommended transfusion 1 unit today  Hypothyroidism Continue Synthroid  Hypertension BP stable  Hyperlipidemia -Continue home medications  Mechanical fall -PT evaluation done, recommended home health PT  Moderate protein calorie malnutrition Encouraged p.o. diet  History of left breast CA,, ER PR positive Oncology following  Code Status DNR DVT Prophylaxis: SCDs Family Communication: Discussed in detail with the patient, all imaging results, lab results explained to the patient    Disposition Plan: Continues to spike fevers, work-up in progress for FUO  Time Spent in minutes   25 minutes  Procedures:  None  Consultants:   Oncology, Dr. Alvy Bimler  Antimicrobials:   Anti-infectives (From admission, onward)   Start     Dose/Rate Route Frequency Ordered Stop   05/23/19 1800  vancomycin (VANCOCIN) IVPB 1000 mg/200 mL premix     1,000 mg 200 mL/hr over 60 Minutes Intravenous Every 24 hours 05/23/19 0527     05/23/19 0800  ceFEPIme (MAXIPIME) 2 g in sodium chloride 0.9 % 100 mL IVPB     2 g 200 mL/hr over 30 Minutes Intravenous Every 12  hours 05/23/19 0527     05/23/19 0030  azithromycin (ZITHROMAX) 500 mg in sodium chloride 0.9 % 250 mL IVPB  Status:  Discontinued     500 mg 250 mL/hr over 60 Minutes Intravenous Daily at bedtime 05/23/19 0026 05/24/19 0952   05/22/19 2000  ceFEPIme (MAXIPIME) 2 g in sodium chloride 0.9 % 100 mL IVPB     2 g 200 mL/hr over 30 Minutes Intravenous  Once 05/22/19 1951 05/22/19 2113   05/22/19 2000  metroNIDAZOLE (FLAGYL) IVPB 500 mg     500 mg 100 mL/hr over 60 Minutes Intravenous  Once 05/22/19 1951 05/22/19 2146   05/22/19 2000  vancomycin (VANCOCIN) IVPB 1000 mg/200 mL premix     1,000 mg 200 mL/hr over 60 Minutes Intravenous  Once 05/22/19 1951  05/22/19 2210         Medications  Scheduled Meds:  sodium chloride   Intravenous Once   diphenhydrAMINE  50 mg Oral Once   Or   diphenhydrAMINE  50 mg Intravenous Once   feeding supplement (ENSURE ENLIVE)  237 mL Oral BID BM   insulin aspart  0-5 Units Subcutaneous QHS   insulin aspart  0-9 Units Subcutaneous TID WC   levothyroxine  44 mcg Oral Once per day on Mon Wed Fri Sat   levothyroxine  88 mcg Oral Once per day on Sun Tue Thu   lidocaine-prilocaine   Topical Once   multivitamin with minerals  1 tablet Oral Daily   pravastatin  40 mg Oral QPM   predniSONE  50 mg Oral Once   predniSONE  50 mg Oral Once   senna-docusate  1 tablet Oral BID   Tbo-filgastrim (GRANIX) SQ  300 mcg Subcutaneous Daily   Continuous Infusions:  ceFEPime (MAXIPIME) IV 2 g (05/25/19 0849)   magnesium sulfate LVP 250-500 ml 3 g (05/25/19 1015)   vancomycin 1,000 mg (05/24/19 1711)   PRN Meds:.acetaminophen **OR** acetaminophen, bisacodyl, HYDROcodone-acetaminophen, lip balm, ondansetron **OR** ondansetron (ZOFRAN) IV, polyethylene glycol, sodium chloride flush, traMADol, traZODone      Subjective:   Tammy Hensley was seen and examined today.  Still spiking fevers, overnight 102.1 F.  No nausea or vomiting, feels weak.  Otherwise no chest pain or shortness of breath.  No abdominal pain.   Patient denies dizziness, N/V/D/C.    Objective:   Vitals:   05/24/19 1030 05/24/19 2030 05/24/19 2241 05/25/19 0508  BP: 120/70 140/72  128/73  Pulse: 92 (!) 109  100  Resp: 19 16  17   Temp: 99.6 F (37.6 C) (!) 102.1 F (38.9 C) 99.2 F (37.3 C) 99.3 F (37.4 C)  TempSrc: Oral Oral Oral   SpO2:  97%  97%  Weight:      Height:        Intake/Output Summary (Last 24 hours) at 05/25/2019 1323 Last data filed at 05/25/2019 0511 Gross per 24 hour  Intake 656 ml  Output 950 ml  Net -294 ml     Wt Readings from Last 3 Encounters:  05/23/19 66.9 kg  05/15/19 64.4 kg  05/01/19  64.4 kg    Physical Exam  General: Alert and oriented x 3, NAD  Eyes:   HEENT:  Atraumatic, normocephalic  Cardiovascular: S1 S2 clear, RRR. trace pedal edema b/l  Respiratory: Decreased breath sounds at bases  Gastrointestinal: Soft, nontender, nondistended, NBS  Ext: Trace pedal edema bilaterally  Neuro: no new deficits  Musculoskeletal: No cyanosis, clubbing  Skin: No rashes  Psych: Normal affect and  demeanor, alert and oriented x3   Data Reviewed:  I have personally reviewed following labs and imaging studies  Micro Results Recent Results (from the past 240 hour(s))  Blood Culture (routine x 2)     Status: None (Preliminary result)   Collection Time: 05/22/19  7:51 PM   Specimen: BLOOD  Result Value Ref Range Status   Specimen Description   Final    BLOOD RIGHT ANTECUBITAL Performed at [facility] [address], [place], [place] [address]    Special Requests   Final    BOTTLES DRAWN AEROBIC AND ANAEROBIC Blood Culture adequate volume Performed at [facility] [address], [place], [place] [address]    Culture   Final    NO GROWTH 3 DAYS Performed at [facility], [place] [address], [place], [place] [address]    Report Status PENDING  Incomplete  SARS Coronavirus 2 (CEPHEID- Performed in [place] hospital lab), Hosp Order     Status: None   Collection Time: 05/22/19  7:52 PM   Specimen: Nasopharyngeal Swab  Result Value Ref Range Status   SARS Coronavirus 2 NEGATIVE NEGATIVE Final    Comment: (NOTE) If result is NEGATIVE SARS-CoV-2 target nucleic acids are NOT DETECTED. The SARS-CoV-2 RNA is generally detectable in upper and lower  respiratory specimens during the acute phase of infection. The lowest  concentration of SARS-CoV-2 viral copies this assay can detect is 250  copies / mL. A negative result does not preclude SARS-CoV-2 infection  and should not be used as the sole basis for treatment or other    patient management decisions.  A negative result may occur with  improper specimen collection / handling, submission of specimen other  than nasopharyngeal swab, presence of viral mutation(s) within the  areas targeted by this assay, and inadequate number of viral copies  (<250 copies / mL). A negative result must be combined with clinical  observations, patient history, and epidemiological information. If result is POSITIVE SARS-CoV-2 target nucleic acids are DETECTED. The SARS-CoV-2 RNA is generally detectable in upper and lower  respiratory specimens dur ing the acute phase of infection.  Positive  results are indicative of active infection with SARS-CoV-2.  Clinical  correlation with patient history and other diagnostic information is  necessary to determine patient infection status.  Positive results do  not rule out bacterial infection or co-infection with other viruses. If result is PRESUMPTIVE POSTIVE SARS-CoV-2 nucleic acids MAY BE PRESENT.   A presumptive positive result was obtained on the submitted specimen  and confirmed on repeat testing.  While 2019 novel coronavirus  (SARS-CoV-2) nucleic acids may be present in the submitted sample  additional confirmatory testing may be necessary for epidemiological  and / or clinical management purposes  to differentiate between  SARS-CoV-2 and other Sarbecovirus currently known to infect humans.  If clinically indicated additional testing with an alternate test  methodology [phone]) is advised. The SARS-CoV-2 RNA is generally  detectable in upper and lower respiratory sp ecimens during the acute  phase of infection. The expected result is Negative. Fact Sheet for Patients:  StrictlyIdeas.no Fact Sheet for Healthcare Providers: BankingDealers.co.za This test is not yet approved or cleared by the [place] FDA and has been authorized for detection and/or diagnosis of SARS-CoV-2  by FDA under an Emergency Use Authorization (EUA).  This EUA will remain in effect (meaning this test can be used) for the duration of the COVID-19 declaration under Section 564(b)(1) of  the Act, 21 U.S.C. section 360bbb-3(b)(1), unless the authorization is terminated or revoked sooner. Performed at [facility], [place] [address], [place], [place] [address]   Blood Culture (routine x 2)     Status: None (Preliminary result)   Collection Time: 05/22/19  7:56 PM   Specimen: BLOOD  Result Value Ref Range Status   Specimen Description   Final    BLOOD BLOOD LEFT HAND Performed at [facility] [address] [place], [place] [address]    Special Requests   Final    BOTTLES DRAWN AEROBIC AND ANAEROBIC Blood Culture adequate volume Performed at [facility] [address], [place], [place] [address]    Culture   Final    NO GROWTH 3 DAYS Performed at [facility], [place] [address] [place], [place] [address]    Report Status PENDING  Incomplete  MRSA PCR Screening     Status: None   Collection Time: 05/23/19 10:11 AM   Specimen: Nasal Mucosa; Nasopharyngeal  Result Value Ref Range Status   MRSA by PCR NEGATIVE NEGATIVE Final    Comment:        The GeneXpert MRSA Assay (FDA approved for NASAL specimens only), is one component of a comprehensive MRSA colonization surveillance program. It is not intended to diagnose MRSA infection nor to guide or monitor treatment for MRSA infections. Performed at [facility], [place] [address], [place], [place] [address]   Culture, blood (routine x 2)     Status: None (Preliminary result)   Collection Time: 05/24/19  9:05 PM   Specimen: BLOOD  Result Value Ref Range Status   Specimen Description   Final    BLOOD BLOOD RIGHT HAND Performed at [facility] [address], [place], [place] [address]    Special Requests   Final    BOTTLES DRAWN  AEROBIC AND ANAEROBIC Blood Culture adequate volume Performed at [facility] [address], [place], [place] [address]    Culture   Final    NO GROWTH < 12 HOURS Performed at [facility] [address] [place], [place] [address]    Report Status PENDING  Incomplete  Culture, blood (routine x 2)     Status: None (Preliminary result)   Collection Time: 05/24/19  9:05 PM   Specimen: BLOOD  Result Value Ref Range Status   Specimen Description   Final    BLOOD BLOOD LEFT ARM Performed at [facility] [address], [place], [place] [address]    Special Requests   Final    BOTTLES DRAWN AEROBIC AND ANAEROBIC Blood Culture adequate volume Performed at [facility] [address], [place], [place] [address]    Culture   Final    NO GROWTH < 12 HOURS Performed at [facility] [address] [place], [place] [address]    Report Status PENDING  Incomplete  Fungus culture, blood     Status: None (Preliminary result)   Collection Time: 05/24/19  9:05 PM   Specimen: BLOOD  Result Value Ref Range Status   Specimen Description   Final    BLOOD Performed at [facility] [address], [place], [place] [address]    Special Requests   Final    Immunocompromised Performed at [facility], [place] [address], [place],  [address]    Culture   Final  NO GROWTH < 12 HOURS Performed at [facility] [address], [place], [place] [address]    Report Status PENDING  Incomplete    Radiology Reports Ct Head Wo Contrast  Result Date: 05/22/2019 CLINICAL DATA:  78 year old female with increased weakness and fever. 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 EXAM: CT HEAD WITHOUT CONTRAST TECHNIQUE: Contiguous axial images were obtained from the base of the skull through the vertex without intravenous contrast. COMPARISON:  Head and cervical spine CT  05/01/2019 and earlier. FINDINGS: Brain: Stable cerebral volume. Stable gray-white matter differentiation throughout the brain. No midline shift, ventriculomegaly, mass effect, evidence of mass lesion, intracranial hemorrhage or evidence of cortically based acute infarction. No cortical encephalomalacia. Vascular: Calcified atherosclerosis at the skull base. No suspicious intracranial vascular hyperdensity. Skull: Stable. No acute or suspicious osseous lesion. Sinuses/Orbits: Visualized paranasal sinuses and mastoids are stable and well pneumatized. Other: Visualized orbits and scalp soft tissues are within normal limits. IMPRESSION: Stable and negative for age noncontrast CT appearance of the brain. Note that early metastatic disease to the brain cannot be excluded in the absence of intravenous contrast. Electronically Signed   By: Genevie Ann M.D.   On: 05/22/2019 23:32   Ct Head Wo Contrast  Result Date: 05/01/2019 CLINICAL DATA:  Posttraumatic headache after fall today. EXAM: CT HEAD WITHOUT CONTRAST CT CERVICAL SPINE WITHOUT CONTRAST TECHNIQUE: Multidetector CT imaging of the head and cervical spine was performed following the standard protocol without intravenous contrast. Multiplanar CT image reconstructions of the cervical spine were also generated. COMPARISON:  None. FINDINGS: CT HEAD FINDINGS Brain: Mild diffuse cortical atrophy is noted. Mild chronic ischemic white matter disease is noted. No mass effect or midline shift is noted. Ventricular size is within normal limits. There is no evidence of mass lesion, hemorrhage or acute infarction. Vascular: No hyperdense vessel or unexpected calcification. Skull: Normal. Negative for fracture or focal lesion. Sinuses/Orbits: No acute finding. Other: None. CT CERVICAL SPINE FINDINGS Alignment: Minimal grade 1 anterolisthesis of C4-5 is noted secondary to posterior facet joint hypertrophy. Skull base and vertebrae: No acute fracture. No primary bone lesion or focal  pathologic process. Soft tissues and spinal canal: No prevertebral fluid or swelling. No visible canal hematoma. Disc levels: Mild degenerative disc disease is noted at C5-6 with anterior posterior osteophyte formation. There appears to be fusion of the C6-7 disc space most likely due to degenerative change. Upper chest: Negative. Other: Degenerative changes are seen involving posterior facet joints bilaterally. IMPRESSION: Mild diffuse cortical atrophy. Mild chronic ischemic white matter disease. No acute intracranial abnormality seen. Multilevel degenerative disc disease. No acute abnormality seen in the cervical spine. Electronically Signed   By: Marijo Conception M.D.   On: 05/01/2019 15:17   Ct Cervical Spine Wo Contrast  Result Date: 05/01/2019 CLINICAL DATA:  Posttraumatic headache after fall today. EXAM: CT HEAD WITHOUT CONTRAST CT CERVICAL SPINE WITHOUT CONTRAST TECHNIQUE: Multidetector CT imaging of the head and cervical spine was performed following the standard protocol without intravenous contrast. Multiplanar CT image reconstructions of the cervical spine were also generated. COMPARISON:  None. FINDINGS: CT HEAD FINDINGS Brain: Mild diffuse cortical atrophy is noted. Mild chronic ischemic white matter disease is noted. No mass effect or midline shift is noted. Ventricular size is within normal limits. There is no evidence of mass lesion, hemorrhage or acute infarction. Vascular: No hyperdense vessel or unexpected calcification. Skull: Normal. Negative for fracture or focal lesion. Sinuses/Orbits: No acute finding. Other: None. CT  CERVICAL SPINE FINDINGS Alignment: Minimal grade 1 anterolisthesis of C4-5 is noted secondary to posterior facet joint hypertrophy. Skull base and vertebrae: No acute fracture. No primary bone lesion or focal pathologic process. Soft tissues and spinal canal: No prevertebral fluid or swelling. No visible canal hematoma. Disc levels: Mild degenerative disc disease is noted at  C5-6 with anterior posterior osteophyte formation. There appears to be fusion of the C6-7 disc space most likely due to degenerative change. Upper chest: Negative. Other: Degenerative changes are seen involving posterior facet joints bilaterally. IMPRESSION: Mild diffuse cortical atrophy. Mild chronic ischemic white matter disease. No acute intracranial abnormality seen. Multilevel degenerative disc disease. No acute abnormality seen in the cervical spine. Electronically Signed   By: Marijo Conception M.D.   On: 05/01/2019 15:17   Dg Chest Port 1 View  Result Date: 05/22/2019 CLINICAL DATA:  78 y/o  F; sepsis. EXAM: PORTABLE CHEST 1 VIEW COMPARISON:  12/05/2018 CT of the chest. FINDINGS: Normal cardiac silhouette. Aortic atherosclerosis with calcification. Right port catheter tip projects over lower SVC. Streaky opacity at the right lung base. No pleural effusion or pneumothorax. No acute osseous abnormality is evident. IMPRESSION: Streaky opacity at the right lung base may represent atelectasis or pneumonia. Electronically Signed   By: Kristine Garbe M.D.   On: 05/22/2019 20:45    Lab Data:  CBC: Recent Labs  Lab 05/22/19 1951 05/23/19 0055 05/24/19 0538 05/25/19 0458  WBC 0.8* 0.6* 0.7* 1.9*  NEUTROABS 0.3*  --  0.3* 1.3*  HGB 9.0* 9.1* 8.2* 7.8*  HCT 28.3* 30.1* 25.7* 25.2*  MCV 78.8* 80.5 78.1* 79.2*  PLT 403* 345 342 417   Basic Metabolic Panel: Recent Labs  Lab 05/22/19 1951 05/23/19 0055 05/24/19 0538 05/24/19 1334 05/25/19 0458  NA 135 139 140  --  137  K 3.7 3.5 2.7* 3.3* 3.4*  CL 97* 103 105  --  103  CO2 27 27 27   --  27  GLUCOSE 125* 125* 97  --  176*  BUN 15 12 9   --  8  CREATININE 0.59 0.48 0.49  --  0.52  CALCIUM 8.4* 8.4* 8.1*  --  8.3*  MG  --  1.8 1.7  --  1.6*  PHOS  --  3.8  --   --   --    GFR: Estimated Creatinine Clearance: 52.8 mL/min (by C-G formula based on SCr of 0.52 mg/dL). Liver Function Tests: Recent Labs  Lab 05/22/19 1951  05/23/19 0055  AST 30 33  ALT 26 24  ALKPHOS 199* 180*  BILITOT 0.5 0.9  PROT 5.4* 5.4*  ALBUMIN 2.7* 2.7*   No results for input(s): LIPASE, AMYLASE in the last 168 hours. No results for input(s): AMMONIA in the last 168 hours. Coagulation Profile: No results for input(s): INR, PROTIME in the last 168 hours. Cardiac Enzymes: No results for input(s): CKTOTAL, CKMB, CKMBINDEX, TROPONINI in the last 168 hours. BNP (last 3 results) No results for input(s): PROBNP in the last 8760 hours. HbA1C: Recent Labs    05/23/19 0055  HGBA1C 7.4*   CBG: Recent Labs  Lab 05/24/19 1134 05/24/19 1628 05/24/19 1953 05/25/19 0823 05/25/19 1157  GLUCAP 157* 136* 155* 118* 191*   Lipid Profile: No results for input(s): CHOL, HDL, LDLCALC, TRIG, CHOLHDL, LDLDIRECT in the last 72 hours. Thyroid Function Tests: Recent Labs    05/23/19 0055  TSH 1.053   Anemia Panel: No results for input(s): VITAMINB12, FOLATE, FERRITIN, TIBC, IRON, RETICCTPCT in the  last 72 hours. Urine analysis:    Component Value Date/Time   COLORURINE YELLOW 05/22/2019 2216   [place] 05/22/2019 2216   LABSPEC 1.012 05/22/2019 2216   PHURINE 7.0 05/22/2019 2216   GLUCOSEU NEGATIVE 05/22/2019 2216   HGBUR NEGATIVE 05/22/2019 2216   BILIRUBINUR NEGATIVE 05/22/2019 2216   KETONESUR NEGATIVE 05/22/2019 2216   PROTEINUR NEGATIVE 05/22/2019 2216   UROBILINOGEN 0.2 10/30/2014 1402   NITRITE NEGATIVE 05/22/2019 2216   LEUKOCYTESUR NEGATIVE 05/22/2019 2216     Christobal Morado M.D. [facility] 05/25/2019, 1:23 PM  Pager: [phone] Between 7am to 7pm - call Pager - [phone]  After 7pm go to [email or web address] - password [email or web address]  Call night coverage person covering after 7pm

## 2019-05-25 NOTE — Progress Notes (Signed)
Occupational Therapy Treatment Patient Details Name: Tammy Hensley MRN: [id number] DOB: 1941-05-16 Today's Date: 05/25/2019    History of present illness Tammy Hensley is a 78 y.o. 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  Anemia, DM 2, HTN, HLD, Hypothyroidism. Pt presents with general fatigue and fever.   OT comments  Pt fatigued this day and waiting on blood.  Pt was agreeable to get OOB and go to bathroom as well as sit in chair for lunch  Follow Up Recommendations  Home health OT;Supervision/Assistance - 24 hour    Equipment Recommendations  None recommended by OT    Recommendations for Other Services      Precautions / Restrictions Precautions Precautions: Fall       Mobility Bed Mobility Overal bed mobility: Needs Assistance Bed Mobility: Supine to Sit     Supine to sit: Supervision;HOB elevated     General bed mobility comments: cues for technique  Transfers Overall transfer level: Needs assistance Equipment used: Rolling walker (2 wheeled) Transfers: Sit to/from [facility] Sit to Stand: Min guard Stand pivot transfers: Min guard       General transfer comment: cues for hand placement for increased safety    Balance Overall balance assessment: Mild deficits observed, not formally tested                                         ADL either performed or assessed with clinical judgement   ADL Overall ADL's : Needs assistance/impaired     Grooming: Brushing hair;Oral care;Wash/dry face;Wash/dry hands;Set up       Lower Body Bathing: Minimal assistance;Sit to/from stand;Cueing for compensatory techniques;Cueing for safety;Cueing for sequencing   Upper Body Dressing : Set up;Sitting   Lower Body Dressing: Minimal assistance;Sit to/from stand;Cueing for safety   Toilet Transfer: Min guard;Cueing for safety;Cueing for sequencing   Toileting- Water quality scientist  and Hygiene: Min guard;Sit to/from stand;Cueing for safety;Cueing for compensatory techniques       Functional mobility during ADLs: Minimal assistance       Vision Baseline Vision/History: No visual deficits            Cognition Arousal/Alertness: Awake/alert Behavior During Therapy: WFL for tasks assessed/performed Overall Cognitive Status: Within Functional Limits for tasks assessed                                                     Pertinent Vitals/ Pain       Pain Assessment: No/denies pain         Frequency  Min 2X/week        Progress Toward Goals  OT Goals(current goals can now be found in the care plan section)  Progress towards OT goals: Not progressing toward goals - comment     Plan Discharge plan remains appropriate       AM-PAC OT "6 Clicks" Daily Activity     Outcome Measure   Help from another person eating meals?: None Help from another person taking care of personal grooming?: A Little Help from another person toileting, which includes using toliet, bedpan, or urinal?: A Little Help from another person bathing (including washing, rinsing, drying)?: A Little Help from another person to  put on and taking off regular upper body clothing?: None Help from another person to put on and taking off regular lower body clothing?: A Little 6 Click Score: 20    End of Session Equipment Utilized During Treatment: Gait belt;Rolling walker  OT Visit Diagnosis: Unsteadiness on feet (R26.81);Repeated falls (R29.6);Muscle weakness (generalized) (M62.81);History of falling (Z91.81)   Activity Tolerance Patient tolerated treatment well   Patient Left in chair;with call bell/phone within reach   Nurse Communication Mobility status        Time: 1210-1225 OT Time Calculation (min): 15 min  Charges: OT General Charges $OT Visit: 1 Visit OT Treatments $Self Care/Home Management : 8-22 mins  Kari Baars, [place] [phone] Office- [phone]      Whitt Auletta, Edwena Felty D 05/25/2019, 1:29 PM

## 2019-05-25 NOTE — TOC Initial Note (Signed)
Transition of Care [facility]) - Initial/Assessment Note    Patient Details  Name: Tammy Hensley MRN: [id number] Date of Birth: 13-May-1941  Transition of [facility]) CM/SW Contact:    Dessa Phi, RN Phone Number: 05/25/2019, 1:19 PM  Clinical Narrative: Active w/[facility]/PT/OT-rep Santiago Glad following for [facility] orders.                   Expected Discharge Plan: [place] Barriers to Discharge: Continued Medical Work up   Patient Goals and CMS Choice        Expected Discharge Plan and Services Expected Discharge Plan: [place]   Discharge Planning Services: CM Consult   Living arrangements for the past 2 months: Single Family Home                                      Prior Living Arrangements/Services Living arrangements for the past 2 months: Single Family Home Lives with:: Spouse Patient language and need for interpreter reviewed:: Yes Do you feel safe going back to the place where you live?: Yes      Need for Family Participation in Patient Care: No (Comment) Care giver support system in place?: Yes (comment) Current home services: DME, Home PT, Home RN, Home OT Criminal Activity/Legal Involvement Pertinent to Current Situation/Hospitalization: No - Comment as needed  Activities of Daily Living Home Assistive Devices/Equipment: Eyeglasses, Environmental consultant (specify type)(reading glasses) ADL Screening (condition at time of admission) Patient's cognitive ability adequate to safely complete daily activities?: Yes Is the patient deaf or have difficulty hearing?: No Does the patient have difficulty seeing, even when wearing glasses/contacts?: No Does the patient have difficulty concentrating, remembering, or making decisions?: No Patient able to express need for assistance with ADLs?: Yes Does the patient have difficulty dressing or bathing?: No Independently performs ADLs?: Yes (appropriate for developmental age) Communication:  Independent Does the patient have difficulty walking or climbing stairs?: Yes Weakness of Legs: Both Weakness of Arms/Hands: Both  Permission Sought/Granted Permission sought to share information with : Case Manager Permission granted to share information with : Yes, Verbal Permission Granted              Emotional Assessment Appearance:: Appears stated age Attitude/Demeanor/Rapport: Gracious Affect (typically observed): Accepting Orientation: : Oriented to Self, Oriented to Place, Oriented to  Time, Oriented to Situation Alcohol / Substance Use: Never Used Psych Involvement: No (comment)  Admission diagnosis:  Chemotherapy-induced neutropenia ([place]) [D70.1, T45.1X5A] Fever, unspecified fever cause [R50.9] Patient Active Problem List   Diagnosis Date Noted  . HCAP (healthcare-associated pneumonia) 05/22/2019  . Neutropenic fever ([place]) 05/22/2019  . Sepsis ([place]) 05/22/2019  . Fall at home, initial encounter 05/22/2019  . Neutropenia ([place]) 05/01/2019  . Hypokalemia 05/01/2019  . Anemia, chronic disease 04/07/2019  . 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([place]) 02/16/2019  . Anemia due to antineoplastic chemotherapy 01/17/2019  . Fluid retention in legs 12/06/2018  . Pancytopenia, acquired ([place]) 12/06/2018  . Goals of care, counseling/discussion 09/16/2018  . Metastasis to bone ([place]) 08/30/2018  . Metastasis to lung ([place]) 08/30/2018  . Cancer associated pain 08/30/2018  . 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 Other constipation 08/30/2018  . Preventive measure 08/30/2018  . Physical debility 08/30/2018  . 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  . Insomnia 09/07/2017  . T2_NIDDM 01/06/2017  . Genetic testing 01/01/2017  . Family history of cancer   . 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 Atherosclerosis of abdominal aorta ([place]) 11/17/2016  . Endometrial cancer ([place]) 09/30/2016  . Osteopenia 11/22/2015  . BMI 25.0-25.9,adult 08/24/2015  . Medication management 01/03/2014  .  Breast cancer, left breast ([place]) 12/19/2013  . Hypothyroidism   . Hypertension   . Hyperlipidemia   . Vitamin D deficiency   . Iron deficiency anemia   . Malignant neoplasm of upper-outer quadrant of left breast in female, estrogen receptor positive ([place]) 12/05/2012   PCP:  [name], MD Pharmacy:   [facility]), [place] [address] [place] [address] Phone: [phone] Fax: [phone]  [place], [place]. [place]. [place] [address] Phone: [phone] Fax: [place], [place] [address] Phone: [phone] Fax: [phone]     Social Determinants of Health (SDOH) Interventions    Readmission Risk Interventions Readmission Risk Prevention Plan 05/25/2019  Transportation Screening Complete  Medication Review Press photographer) Complete  PCP or Specialist appointment within 3-5 days of discharge Complete  HRI or [place] Complete  SW Recovery Care/Counseling Consult Complete  [place] Not Applicable  Some recent data might be hidden

## 2019-05-25 NOTE — Progress Notes (Signed)
Tammy Hensley   DOB:09/06/1941   HU#:[id number]    ASSESSMENT & PLAN:  Metastatic uterine cancer She had received chemotherapy recently Continue aggressive supportive care  Acquired pancytopenia She has neutropenic fever She was started on G-CSF support since 05/23/19 to keep [place] greater than 1.5 We will follow cultures; so far the first set of cultures are negative She will continue broad-spectrum IV antibiotics for now I am not convinced that the abnormal infiltrate on the chest x-ray is due to pneumonia?It could be atelectasis I have a long discussion with the patient, hospitalist and her son I recommend CT imaging of the abdomen to look for source of infection Sometimes, liver necrosis/liver metastasis can also cause tumor fever If the CT scan is not helpful, the last resort would be to do echocardiogram to look for evidence of infective endocarditis Due to contrast allergy, I have ordered premedication order set She is anemic today I recommend blood transfusion We discussed some of the risks, benefits, and alternatives of blood transfusions. The patient is symptomatic from anemia and the hemoglobin level is critically low.  Some of the side-effects to be expected including risks of transfusion reactions, chills, infection, syndrome of volume overload and risk of hospitalization from various reasons and the patient is willing to proceed and went ahead to sign consent today. I have ordered 1 unit of blood The goal is to keep hemoglobin greater than 8 I have ordered irradiated blood products  Moderate protein calorie malnutrition She is receiving IV fluids She is on low-dose steroids as appetite stimulant She is being followed by dietitian  Recurrent falls She has home health nurse checking her as well as home physical therapy I recommend inpatient PT for evaluation  Altered mental status, resolved  cause is unknown. CT head is negative for stroke She has no residual  neurological deficit  Code Status DNR  Goals of care The plan is to have resolution of neutropenic fever  Discharge planning She would likely be here till the end of the week until cultures are negative and she becomes afebrile I have called her son and updated him We will discuss with home health care team and update them of her recent admission I am not available this weekend but will return on Monday to check on her if she is still here If she gets discharged over the weekend, I will call her next week for further follow-up All questions were answered. The patient knows to call the clinic with any problems, questions or concerns.  [name], MD 05/25/2019 8:13 AM  Subjective:  The patient continues to spike fever every day although she is not symptomatic.  She denies pain.  Her appetite is stable.  She was able to sit on a chair and participate with physical therapy yesterday The patient denies any recent signs or symptoms of bleeding such as spontaneous epistaxis, hematuria or hematochezia.   Objective:  Vitals:   05/24/19 2241 05/25/19 0508  BP:  128/73  Pulse:  100  Resp:  17  Temp: 99.2 F (37.3 C) 99.3 F (37.4 C)  SpO2:  97%     Intake/Output Summary (Last 24 hours) at 05/25/2019 0813 Last data filed at 05/25/2019 0511 Gross per 24 hour  Intake 776 ml  Output 950 ml  Net -174 ml    GENERAL:alert, no distress and comfortable.  She looks mildly cushingoid SKIN: skin color, texture, turgor are normal, no rashes or significant lesions EYES: normal, Conjunctiva are pink and non-injected,  sclera clear OROPHARYNX:no exudate, no erythema and lips, buccal mucosa, and tongue normal  NECK: supple, thyroid normal size, non-tender, without nodularity LYMPH:  no palpable lymphadenopathy in the cervical, axillary or inguinal LUNGS: clear to auscultation and percussion with normal breathing effort HEART: regular rate & rhythm and no murmurs with bilateral moderate lower  extremity edema ABDOMEN:abdomen soft, non-tender and normal bowel sounds Musculoskeletal:no cyanosis of digits and no clubbing  NEURO: alert & oriented x 3 with fluent speech, no focal motor/sensory deficits   Labs:  Recent Labs    05/01/19 1321  05/15/19 1011 05/22/19 1951 05/23/19 0055 05/24/19 0538 05/24/19 1334 05/25/19 0458  NA 132*   < > 134* 135 139 140  --  137  K 2.8*   < > 4.1 3.7 3.5 2.7* 3.3* 3.4*  CL 92*   < > 96* 97* 103 105  --  103  CO2 30   < > 31 27 27 27   --  27  GLUCOSE 137*   < > 173* 125* 125* 97  --  176*  BUN 9   < > 19 15 12 9   --  8  CREATININE 0.65   < > 0.72 0.59 0.48 0.49  --  0.52  CALCIUM 8.6*   < > 9.0 8.4* 8.4* 8.1*  --  8.3*  GFRNONAA >60   < > >60 >60 >60 >60  --  >60  GFRAA >60   < > >60 >60 >60 >60  --  >60  PROT 5.6*   < > 6.0* 5.4* 5.4*  --   --   --   ALBUMIN 3.0*   < > 2.8* 2.7* 2.7*  --   --   --   AST 44*   < > 41 30 33  --   --   --   ALT 28   < > 37 26 24  --   --   --   ALKPHOS 119   < > 245* 199* 180*  --   --   --   BILITOT 0.8   < > 0.4 0.5 0.9  --   --   --   BILIDIR 0.3*  --   --   --   --   --   --   --   IBILI 0.5  --   --   --   --   --   --   --    < > = values in this interval not displayed.    Studies:  Ct Head Wo Contrast  Result Date: 05/22/2019 CLINICAL DATA:  78 year old female with increased weakness and fever. 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 EXAM: CT HEAD WITHOUT CONTRAST TECHNIQUE: Contiguous axial images were obtained from the base of the skull through the vertex without intravenous contrast. COMPARISON:  Head and cervical spine CT 05/01/2019 and earlier. FINDINGS: Brain: Stable cerebral volume. Stable gray-white matter differentiation throughout the brain. No midline shift, ventriculomegaly, mass effect, evidence of mass lesion, intracranial hemorrhage or evidence of cortically based acute infarction. No cortical encephalomalacia. Vascular: Calcified atherosclerosis at the skull base.  No suspicious intracranial vascular hyperdensity. Skull: Stable. No acute or suspicious osseous lesion. Sinuses/Orbits: Visualized paranasal sinuses and mastoids are stable and well pneumatized. Other: Visualized orbits and scalp soft tissues are within normal limits. IMPRESSION: Stable and negative for age noncontrast CT appearance of the brain. Note that early metastatic disease to the brain cannot be excluded in the absence of intravenous  contrast. Electronically Signed   By: Genevie Ann M.D.   On: 05/22/2019 23:32   Ct Head Wo Contrast  Result Date: 05/01/2019 CLINICAL DATA:  Posttraumatic headache after fall today. EXAM: CT HEAD WITHOUT CONTRAST CT CERVICAL SPINE WITHOUT CONTRAST TECHNIQUE: Multidetector CT imaging of the head and cervical spine was performed following the standard protocol without intravenous contrast. Multiplanar CT image reconstructions of the cervical spine were also generated. COMPARISON:  None. FINDINGS: CT HEAD FINDINGS Brain: Mild diffuse cortical atrophy is noted. Mild chronic ischemic white matter disease is noted. No mass effect or midline shift is noted. Ventricular size is within normal limits. There is no evidence of mass lesion, hemorrhage or acute infarction. Vascular: No hyperdense vessel or unexpected calcification. Skull: Normal. Negative for fracture or focal lesion. Sinuses/Orbits: No acute finding. Other: None. CT CERVICAL SPINE FINDINGS Alignment: Minimal grade 1 anterolisthesis of C4-5 is noted secondary to posterior facet joint hypertrophy. Skull base and vertebrae: No acute fracture. No primary bone lesion or focal pathologic process. Soft tissues and spinal canal: No prevertebral fluid or swelling. No visible canal hematoma. Disc levels: Mild degenerative disc disease is noted at C5-6 with anterior posterior osteophyte formation. There appears to be fusion of the C6-7 disc space most likely due to degenerative change. Upper chest: Negative. Other: Degenerative changes  are seen involving posterior facet joints bilaterally. IMPRESSION: Mild diffuse cortical atrophy. Mild chronic ischemic white matter disease. No acute intracranial abnormality seen. Multilevel degenerative disc disease. No acute abnormality seen in the cervical spine. Electronically Signed   By: Marijo Conception M.D.   On: 05/01/2019 15:17   Ct Cervical Spine Wo Contrast  Result Date: 05/01/2019 CLINICAL DATA:  Posttraumatic headache after fall today. EXAM: CT HEAD WITHOUT CONTRAST CT CERVICAL SPINE WITHOUT CONTRAST TECHNIQUE: Multidetector CT imaging of the head and cervical spine was performed following the standard protocol without intravenous contrast. Multiplanar CT image reconstructions of the cervical spine were also generated. COMPARISON:  None. FINDINGS: CT HEAD FINDINGS Brain: Mild diffuse cortical atrophy is noted. Mild chronic ischemic white matter disease is noted. No mass effect or midline shift is noted. Ventricular size is within normal limits. There is no evidence of mass lesion, hemorrhage or acute infarction. Vascular: No hyperdense vessel or unexpected calcification. Skull: Normal. Negative for fracture or focal lesion. Sinuses/Orbits: No acute finding. Other: None. CT CERVICAL SPINE FINDINGS Alignment: Minimal grade 1 anterolisthesis of C4-5 is noted secondary to posterior facet joint hypertrophy. Skull base and vertebrae: No acute fracture. No primary bone lesion or focal pathologic process. Soft tissues and spinal canal: No prevertebral fluid or swelling. No visible canal hematoma. Disc levels: Mild degenerative disc disease is noted at C5-6 with anterior posterior osteophyte formation. There appears to be fusion of the C6-7 disc space most likely due to degenerative change. Upper chest: Negative. Other: Degenerative changes are seen involving posterior facet joints bilaterally. IMPRESSION: Mild diffuse cortical atrophy. Mild chronic ischemic white matter disease. No acute intracranial  abnormality seen. Multilevel degenerative disc disease. No acute abnormality seen in the cervical spine. Electronically Signed   By: Marijo Conception M.D.   On: 05/01/2019 15:17   Dg Chest Port 1 View  Result Date: 05/22/2019 CLINICAL DATA:  78 y/o  F; sepsis. EXAM: PORTABLE CHEST 1 VIEW COMPARISON:  12/05/2018 CT of the chest. FINDINGS: Normal cardiac silhouette. Aortic atherosclerosis with calcification. Right port catheter tip projects over lower SVC. Streaky opacity at the right lung base. No pleural effusion or pneumothorax. No  acute osseous abnormality is evident. IMPRESSION: Streaky opacity at the right lung base may represent atelectasis or pneumonia. Electronically Signed   By: Kristine Garbe M.D.   On: 05/22/2019 20:45

## 2019-05-25 NOTE — Progress Notes (Signed)
Pharmacy Antibiotic Note  Tammy Hensley is a 78 y.o. female admitted on 05/22/2019 with febrile neutropenia and sepsis.    She continues to spike fevers. Plan is to have abd CT to r/o infection. Her vanc levels came back today and yield an AUC of 247 (low). We will adjust the dose to yield an AUC 400-550  Plan:  Change vanc to 1g IV q12>>AUC 497, scr used 1 Cefepime 2gm iv q12hr    Height: 5\' 2"  (157.5 cm) Weight: 147 lb 7.8 oz (66.9 kg) IBW/kg (Calculated) : 50.1  Temp (24hrs), Avg:100.2 F (37.9 C), Min:99.2 F (37.3 C), Max:102.1 F (38.9 C)  Recent Labs  Lab 05/22/19 1951 05/23/19 0055 05/23/19 0512 05/23/19 0857 05/24/19 0538 05/24/19 2105 05/25/19 0458 05/25/19 1237  WBC 0.8* 0.6*  --   --  0.7*  --  1.9*  --   CREATININE 0.59 0.48  --   --  0.49  --  0.52  --   LATICACIDVEN 1.3 1.4 3.2* 1.6  --   --   --   --   VANCOPEAK  --   --   --   --   --  20*  --   --   VANCORANDOM  --   --   --   --   --   --   --  4    Estimated Creatinine Clearance: 52.8 mL/min (by C-G formula based on SCr of 0.52 mg/dL).    Allergies  Allergen Reactions  . Omnipaque [Iohexol] Hives    Pt will need 13 hr prep in the future--SLG  . Calan [Verapamil]     Constipation   . Effexor [Venlafaxine]     unknown  . Erythromycin Nausea And Vomiting  . Femara [Letrozole] Swelling  . Fosamax [Alendronate]     aching  . Ibuprofen     Dizziness, incoherent   . Paxil [Paroxetine Hcl] Nausea Only  . Remeron [Mirtazapine]     Weight gain   . Vasotec [Enalapril] Cough    Antimicrobials this admission: Vancomycin 05/22/2019 >> Cefepime 05/22/2019 >>  Azithromycin 05/23/2019 >>7/1  Dose adjustments this admission: 7/2 Vanc changed 1g IV q12  Microbiology results: 6/29 blood>>ngtd 6/29 COVID neg 6/30 MRSA neg 7/1 fungal blood>>  Onnie Boer, PharmD, [place], AAHIVP, CPP Infectious Disease Pharmacist 05/25/2019 2:06 PM

## 2019-05-26 LAB — CBC WITH DIFFERENTIAL/PLATELET
Abs Immature Granulocytes: 1.2 10*3/uL — ABNORMAL HIGH (ref 0.00–0.07)
Band Neutrophils: 6 %
Basophils Absolute: 0 10*3/uL (ref 0.0–0.1)
Basophils Relative: 0 %
Eosinophils Absolute: 0 10*3/uL (ref 0.0–0.5)
Eosinophils Relative: 0 %
HCT: 32.2 % — ABNORMAL LOW (ref 36.0–46.0)
Hemoglobin: 10 g/dL — ABNORMAL LOW (ref 12.0–15.0)
Lymphocytes Relative: 4 %
Lymphs Abs: 0.5 10*3/uL — ABNORMAL LOW (ref 0.7–4.0)
MCH: 24.8 pg — ABNORMAL LOW (ref 26.0–34.0)
MCHC: 31.1 g/dL (ref 30.0–36.0)
MCV: 79.7 fL — ABNORMAL LOW (ref 80.0–100.0)
Metamyelocytes Relative: 5 %
Monocytes Absolute: 0.7 10*3/uL (ref 0.1–1.0)
Monocytes Relative: 6 %
Myelocytes: 6 %
Neutro Abs: 8.9 10*3/uL — ABNORMAL HIGH (ref 1.7–7.7)
Neutrophils Relative %: 73 %
Platelets: 418 10*3/uL — ABNORMAL HIGH (ref 150–400)
RBC: 4.04 MIL/uL (ref 3.87–5.11)
RDW: 20.6 % — ABNORMAL HIGH (ref 11.5–15.5)
WBC: 11.3 10*3/uL — ABNORMAL HIGH (ref 4.0–10.5)
nRBC: 1.6 % — ABNORMAL HIGH (ref 0.0–0.2)

## 2019-05-26 LAB — GLUCOSE, CAPILLARY
Glucose-Capillary: 180 mg/dL — ABNORMAL HIGH (ref 70–99)
Glucose-Capillary: 163 mg/dL — ABNORMAL HIGH (ref 70–99)

## 2019-05-26 MED ORDER — PROCHLORPERAZINE MALEATE 10 MG PO TABS: 10.0000 mg | ORAL_TABLET | Freq: Four times a day (QID) | ORAL | 1 refills | Status: AC | PRN

## 2019-05-26 MED ORDER — AMOXICILLIN-POT CLAVULANATE 875-125 MG PO TABS: 1.0000 | ORAL_TABLET | Freq: Two times a day (BID) | ORAL | 0 refills | Status: AC

## 2019-05-26 MED ORDER — PANTOPRAZOLE SODIUM 40 MG PO TBEC: 40.0000 mg | DELAYED_RELEASE_TABLET | Freq: Two times a day (BID) | ORAL | 0 refills | Status: DC

## 2019-05-26 MED ORDER — POLYETHYLENE GLYCOL 3350 17 G PO PACK: 17.0000 g | PACK | Freq: Every day | ORAL | 0 refills | Status: AC | PRN

## 2019-05-26 MED ORDER — HEPARIN SOD (PORK) LOCK FLUSH 100 UNIT/ML IV SOLN
500.0000 [IU] | INTRAVENOUS | Status: AC | PRN
  Administered 2019-05-26: 500 [IU]

## 2019-05-26 MED ORDER — FUROSEMIDE 20 MG PO TABS: 20.0000 mg | ORAL_TABLET | Freq: Every day | ORAL | 1 refills | Status: AC | PRN

## 2019-05-26 MED ORDER — HYDROCODONE-ACETAMINOPHEN 5-325 MG PO TABS: 1.0000 | ORAL_TABLET | Freq: Four times a day (QID) | ORAL | 0 refills | Status: DC | PRN

## 2019-05-26 NOTE — Discharge Summary (Signed)
Physician Discharge Summary   Patient ID: BAILEY FAIELLA MRN: [id number] DOB/AGE: Apr 14, 1941 78 y.o.  Admit date: 05/22/2019 Discharge date: 05/26/2019  Primary Care Physician:  [name], MD   Recommendations for Outpatient Follow-up:  1. Follow up with Dr. Alvy Bimler in 1-2 weeks  Home Health: Resume home health PT OT, RN.  Home health aide will be arranged Equipment/Devices:   Discharge Condition: stable CODE STATUS: DNR Diet recommendation: Heart healthy diet   Discharge Diagnoses:   . Neutropenic fever ([place]), also likely tumor fever from necrosis . Acute metabolic encephalopathy . Hypokalemia  . Hypomagnesemia  . Hypothyroidism . Hypertension . Hyperlipidemia . Endometrial cancer ([place]) . Cancer associated pain . Anemia, chronic disease . Moderate protein calorie malnutrition . Mechanical falls History of left breast CA, ER PR positive  Consults: Oncology, Dr Alvy Bimler    Allergies:   Allergies  Allergen Reactions  . Omnipaque [Iohexol] Hives    Pt will need 13 hr prep in the future--SLG  . Calan [Verapamil]     Constipation   . Effexor [Venlafaxine]     unknown  . Erythromycin Nausea And Vomiting  . Femara [Letrozole] Swelling  . Fosamax [Alendronate]     aching  . Ibuprofen     Dizziness, incoherent   . Paxil [Paroxetine Hcl] Nausea Only  . Remeron [Mirtazapine]     Weight gain   . Vasotec [Enalapril] Cough     DISCHARGE MEDICATIONS: Allergies as of 05/26/2019      Reactions   Omnipaque [iohexol] Hives   Pt will need 13 hr prep in the future--SLG   Calan [verapamil]    Constipation   Effexor [venlafaxine]    unknown   Erythromycin Nausea And Vomiting   Femara [letrozole] Swelling   Fosamax [alendronate]    aching   Ibuprofen    Dizziness, incoherent    Paxil [paroxetine Hcl] Nausea Only   Remeron [mirtazapine]    Weight gain   Vasotec [enalapril] Cough      Medication List    STOP taking these medications   aspirin 81 MG  tablet   triamterene-hydrochlorothiazide 75-50 MG tablet Commonly known as: MAXZIDE     TAKE these medications   amoxicillin-clavulanate 875-125 MG tablet Commonly known as: Augmentin Take 1 tablet by mouth 2 (two) times daily for 6 days.   CALCIUM 600 + D PO Take 1 tablet by mouth 2 (two) times daily.   dexamethasone 4 MG tablet Commonly known as: DECADRON Take 2 mg every 2 days What changed:   how much to take  how to take this  when to take this  additional instructions   furosemide 20 MG tablet Commonly known as: Lasix Take 1 tablet (20 mg total) by mouth daily as needed for fluid or edema.   HYDROcodone-acetaminophen 5-325 MG tablet Commonly known as: NORCO/VICODIN Take 1-2 tablets by mouth every 6 (six) hours as needed for severe pain.   levothyroxine 88 MCG tablet Commonly known as: SYNTHROID Take 1 tablet (88 mcg total) by mouth daily before breakfast. What changed:   how much to take  when to take this  additional instructions   magic mouthwash w/lidocaine Soln Swish 96ml by mouth and swallow, three times a day as needed for mouth pain.   metFORMIN 500 MG 24 hr tablet Commonly known as: GLUCOPHAGE-XR Take 1 tablet (500 mg total) by mouth 2 (two) times daily.   multivitamin with minerals Tabs tablet Take 1 tablet by mouth daily.   pantoprazole 40 MG  tablet Commonly known as: PROTONIX Take 1 tablet (40 mg total) by mouth 2 (two) times daily.   polyethylene glycol 17 g packet Commonly known as: MIRALAX / GLYCOLAX Take 17 g by mouth daily as needed for moderate constipation.   pravastatin 40 MG tablet Commonly known as: PRAVACHOL Take 1 tablet (40 mg total) by mouth every evening.   prochlorperazine 10 MG tablet Commonly known as: COMPAZINE Take 1 tablet (10 mg total) by mouth every 6 (six) hours as needed for nausea or vomiting. What changed: reasons to take this   traMADol 50 MG tablet Commonly known as: Ultram Take 1 tablet (50 mg  total) by mouth 3 (three) times daily. Take 1/2 to 1 tablet every 4 hours as needed for severe Back Pain What changed:   how much to take  when to take this  reasons to take this   Vitamin D 50 MCG (2000 UT) Caps Take 2,000 Units by mouth every other day.        Brief H and P: For complete details please refer to admission H and P, but in brief Tammy Sebree Tollisonis an 78 y.o.femalePresented withgeneralized increased fatigue and fever started today recently had negative covid test. Recent fall on Thursday 4 days ago hitting her face.admitted to the hospital from the 8th-10th of June for fever and weaknessand at that time was diagnosed with neutropenic feverand no source was noted at that time.  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 Oncology was consulted, was started on G-CSF since 6/30 -Patient was placed on IV broad-spectrum antibiotics, IV vancomycin and cefepime.  Azithromycin was discontinued. -Patient had intermittent fever spikes during hospitalization. -Blood cultures 7/1 negative so far, fungus culture also neg  -Patient was closely followed by oncology, Dr Elson Areas, recommended CT abdomen and pelvis. Per oncology, sometimes liver necrosis/liver mets can also cause tumor fever.  -CT abdomen and pelvis showed enlarging large necrotic hepatic mass, stable pathological fracture of the left iliac bone, large destructive tumor invading the left iliac [place] muscle, left gluteus medius muscle, new small right pleural effusion, bibasilar atelectasis. -Discussed with oncology, Dr. Alvy Bimler, cleared to discharge home and continue oral antibiotics to complete course, transition to oral Augmentin for 5 more days. - Dr Alvy Bimler will follow patient outpatient in the clinic next week.   Acute metabolic encephalopathy -Resolved, currently  alert and oriented, at baseline  Hypokalemia, hypomagnesemia Replaced  Metastatic endometrial CA -Recently received chemotherapy, oncology following  Anemia: Likely due to chemotherapy and anemia of chronic disease -Patient received 1 unit packed RBCs on 05/25/19 hemoglobin of 7.8  -Hemoglobin improved to 10.0 on discharge  Hypothyroidism Continue Synthroid  Hypertension BP stable  Hyperlipidemia -Continue home medications  Mechanical fall -PT evaluation done, recommended home health PT, arranged by the case manager  Moderate protein calorie malnutrition Encouraged p.o. diet  History of left breast CA,, ER PR positive Oncology following   Day of Discharge S: No new complaints, no fevers overnight.  Patient feels stronger and wants to go home.  BP (!) 152/80 (BP Location: Left Arm)   Pulse 86   Temp 98 F (36.7 C) (Oral)   Resp 16   Ht 5\' 2"  (1.575 m)   Wt 66.9 kg   SpO2 92%   BMI 26.98 kg/m   Physical Exam: General: Alert and awake oriented x3 not in any acute distress. HEENT: anicteric  sclera, pupils reactive to light and accommodation CVS: S1-S2 clear no murmur rubs or gallops Chest: clear to auscultation bilaterally, no wheezing rales or rhonchi Abdomen: soft nontender, nondistended, normal bowel sounds Extremities: no cyanosis, clubbing or edema noted bilaterally Neuro: Cranial nerves II-XII intact, no focal neurological deficits   The results of significant diagnostics from this hospitalization (including imaging, microbiology, ancillary and laboratory) are listed below for reference.      Procedures/Studies:  Ct Head Wo Contrast  Result Date: 05/22/2019 CLINICAL DATA:  78 year old female with increased weakness and fever. 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 EXAM: CT HEAD WITHOUT CONTRAST TECHNIQUE: Contiguous axial images were obtained from the base of the skull through the vertex without intravenous contrast.  COMPARISON:  Head and cervical spine CT 05/01/2019 and earlier. FINDINGS: Brain: Stable cerebral volume. Stable gray-white matter differentiation throughout the brain. No midline shift, ventriculomegaly, mass effect, evidence of mass lesion, intracranial hemorrhage or evidence of cortically based acute infarction. No cortical encephalomalacia. Vascular: Calcified atherosclerosis at the skull base. No suspicious intracranial vascular hyperdensity. Skull: Stable. No acute or suspicious osseous lesion. Sinuses/Orbits: Visualized paranasal sinuses and mastoids are stable and well pneumatized. Other: Visualized orbits and scalp soft tissues are within normal limits. IMPRESSION: Stable and negative for age noncontrast CT appearance of the brain. Note that early metastatic disease to the brain cannot be excluded in the absence of intravenous contrast. Electronically Signed   By: Genevie Ann M.D.   On: 05/22/2019 23:32   Ct Head Wo Contrast  Result Date: 05/01/2019 CLINICAL DATA:  Posttraumatic headache after fall today. EXAM: CT HEAD WITHOUT CONTRAST CT CERVICAL SPINE WITHOUT CONTRAST TECHNIQUE: Multidetector CT imaging of the head and cervical spine was performed following the standard protocol without intravenous contrast. Multiplanar CT image reconstructions of the cervical spine were also generated. COMPARISON:  None. FINDINGS: CT HEAD FINDINGS Brain: Mild diffuse cortical atrophy is noted. Mild chronic ischemic white matter disease is noted. No mass effect or midline shift is noted. Ventricular size is within normal limits. There is no evidence of mass lesion, hemorrhage or acute infarction. Vascular: No hyperdense vessel or unexpected calcification. Skull: Normal. Negative for fracture or focal lesion. Sinuses/Orbits: No acute finding. Other: None. CT CERVICAL SPINE FINDINGS Alignment: Minimal grade 1 anterolisthesis of C4-5 is noted secondary to posterior facet joint hypertrophy. Skull base and vertebrae: No acute  fracture. No primary bone lesion or focal pathologic process. Soft tissues and spinal canal: No prevertebral fluid or swelling. No visible canal hematoma. Disc levels: Mild degenerative disc disease is noted at C5-6 with anterior posterior osteophyte formation. There appears to be fusion of the C6-7 disc space most likely due to degenerative change. Upper chest: Negative. Other: Degenerative changes are seen involving posterior facet joints bilaterally. IMPRESSION: Mild diffuse cortical atrophy. Mild chronic ischemic white matter disease. No acute intracranial abnormality seen. Multilevel degenerative disc disease. No acute abnormality seen in the cervical spine. Electronically Signed   By: Marijo Conception M.D.   On: 05/01/2019 15:17   Ct Cervical Spine Wo Contrast  Result Date: 05/01/2019 CLINICAL DATA:  Posttraumatic headache after fall today. EXAM: CT HEAD WITHOUT CONTRAST CT CERVICAL SPINE WITHOUT CONTRAST TECHNIQUE: Multidetector CT imaging of the head and cervical spine was performed following the standard protocol without intravenous contrast. Multiplanar CT image reconstructions of the cervical spine were also generated. COMPARISON:  None. FINDINGS: CT HEAD FINDINGS Brain: Mild diffuse cortical atrophy is noted. Mild chronic ischemic white matter disease is noted. No  mass effect or midline shift is noted. Ventricular size is within normal limits. There is no evidence of mass lesion, hemorrhage or acute infarction. Vascular: No hyperdense vessel or unexpected calcification. Skull: Normal. Negative for fracture or focal lesion. Sinuses/Orbits: No acute finding. Other: None. CT CERVICAL SPINE FINDINGS Alignment: Minimal grade 1 anterolisthesis of C4-5 is noted secondary to posterior facet joint hypertrophy. Skull base and vertebrae: No acute fracture. No primary bone lesion or focal pathologic process. Soft tissues and spinal canal: No prevertebral fluid or swelling. No visible canal hematoma. Disc levels:  Mild degenerative disc disease is noted at C5-6 with anterior posterior osteophyte formation. There appears to be fusion of the C6-7 disc space most likely due to degenerative change. Upper chest: Negative. Other: Degenerative changes are seen involving posterior facet joints bilaterally. IMPRESSION: Mild diffuse cortical atrophy. Mild chronic ischemic white matter disease. No acute intracranial abnormality seen. Multilevel degenerative disc disease. No acute abnormality seen in the cervical spine. Electronically Signed   By: Marijo Conception M.D.   On: 05/01/2019 15:17   Ct Abdomen Pelvis W Contrast  Result Date: 05/26/2019 CLINICAL DATA:  Neutropenia, fever and weakness. Metastatic endometrial cancer. EXAM: CT ABDOMEN AND PELVIS WITH CONTRAST TECHNIQUE: Multidetector CT imaging of the abdomen and pelvis was performed using the standard protocol following bolus administration of intravenous contrast. CONTRAST:  18mL OMNIPAQUE IOHEXOL 300 MG/ML  SOLN COMPARISON:  CT scan 04/13/2019 FINDINGS: Lower chest: Small right pleural effusion with overlying atelectasis, new since the prior study. Minimal left basilar atelectasis. The heart is normal in size. No pericardial effusion. Hepatobiliary: Enlarging necrotic appearing liver lesion occupying most of the upper right hepatic lobe and part of the left lobe. It measures 13.5 x 13.0 cm and previously measured 13.0 x 9.8 cm. A perfusion abnormality is noted around the lesion. Moderate mass effect on the intrahepatic IVC. The gallbladder is contracted.  No common bile duct dilatation. Pancreas: No mass, inflammation or ductal dilatation. Spleen: Normal size. No focal lesions. Small accessory spleens are noted. Adrenals/Urinary Tract: Mild nodularity of the left adrenal gland is stable. Left renal cyst is stable. No worrisome renal lesions or hydronephrosis. The bladder is mildly distended but no bladder mass or calculi. Stomach/Bowel: The stomach, duodenum, small bowel and  colon are grossly normal without oral contrast. No acute inflammatory changes, mass lesions or obstructive findings. The terminal ileum is normal. The appendix is normal. Vascular/Lymphatic: Stable advanced atherosclerotic calcifications involving the aorta iliac arteries but no aneurysm or dissection. The branch vessels are patent. No mesenteric or retroperitoneal lymphadenopathy. No omental disease is identified. Reproductive: Surgically absent. Other: No pelvic mass or pelvic lymphadenopathy. There is a small amount of fluid and edema in the presacral space. No inguinal mass or inguinal adenopathy. Musculoskeletal: Stable pathologic fracture involving the left iliac bone which is destroyed. Stable associated large soft tissue mass involving the iliac side and gluteus muscles. The left SI joint is mildly widened and the pubic symphysis again demonstrates diastasis. IMPRESSION: 1. Enlarging large necrotic hepatic mass. 2. 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 3. New small right pleural effusion and bibasilar atelectasis. Electronically Signed   By: Marijo Sanes M.D.   On: 05/26/2019 08:44   Dg Chest Port 1 View  Result Date: 05/22/2019 CLINICAL DATA:  78 y/o  F; sepsis. EXAM: PORTABLE CHEST 1 VIEW COMPARISON:  12/05/2018 CT of the chest. FINDINGS: Normal cardiac silhouette.  Aortic atherosclerosis with calcification. Right port catheter tip projects over lower SVC. Streaky opacity at the right lung base. No pleural effusion or pneumothorax. No acute osseous abnormality is evident. IMPRESSION: Streaky opacity at the right lung base may represent atelectasis or pneumonia. Electronically Signed   By: Kristine Garbe M.D.   On: 05/22/2019 20:45       LAB RESULTS: Basic Metabolic Panel: Recent Labs  Lab 05/23/19 0055 05/24/19 0538 05/24/19 1334 05/25/19 0458  NA 139 140  --  137  K 3.5  2.7* 3.3* 3.4*  CL 103 105  --  103  CO2 27 27  --  27  GLUCOSE 125* 97  --  176*  BUN 12 9  --  8  CREATININE 0.48 0.49  --  0.52  CALCIUM 8.4* 8.1*  --  8.3*  MG 1.8 1.7  --  1.6*  PHOS 3.8  --   --   --    Liver Function Tests: Recent Labs  Lab 05/22/19 1951 05/23/19 0055  AST 30 33  ALT 26 24  ALKPHOS 199* 180*  BILITOT 0.5 0.9  PROT 5.4* 5.4*  ALBUMIN 2.7* 2.7*   No results for input(s): LIPASE, AMYLASE in the last 168 hours. No results for input(s): AMMONIA in the last 168 hours. CBC: Recent Labs  Lab 05/25/19 0458 05/26/19 0420  WBC 1.9* 11.3*  NEUTROABS 1.3* 8.9*  HGB 7.8* 10.0*  HCT 25.2* 32.2*  MCV 79.2* 79.7*  PLT 332 418*   Cardiac Enzymes: No results for input(s): CKTOTAL, CKMB, CKMBINDEX, TROPONINI in the last 168 hours. BNP: Invalid input(s): POCBNP CBG: Recent Labs  Lab 05/25/19 2120 05/26/19 0735  GLUCAP 221* 180*      Disposition and Follow-up: Discharge Instructions    Diet - low sodium heart healthy   Complete by: As directed    Increase activity slowly   Complete by: As directed        DISPOSITION: [place]    [name], MD Follow up in 1 week(s).   Specialty: Internal Medicine Contact information: [address] [place] [address] [phone]        [name], MD. Schedule an appointment as soon as possible for a visit in 1 week(s).   Specialty: Hematology and Oncology Contact information: [place] [address] [phone]            Time coordinating discharge:  35 minutes  Signed:   Estill Cotta M.D. [facility] 05/26/2019, 10:54 AM

## 2019-05-26 NOTE — Plan of Care (Signed)
  Problem: Education: Goal: Knowledge of General Education information will improve Description: Including pain rating scale, medication(s)/side effects and non-pharmacologic comfort measures 05/26/2019 1033 by Timoteo Gaul, RN Outcome: Completed/Met 05/26/2019 1031 by Timoteo Gaul, RN Outcome: Progressing   Problem: Health Behavior/Discharge Planning: Goal: Ability to manage health-related needs will improve 05/26/2019 1033 by Timoteo Gaul, RN Outcome: Completed/Met 05/26/2019 1031 by Timoteo Gaul, RN Outcome: Progressing   Problem: Clinical Measurements: Goal: Ability to maintain clinical measurements within normal limits will improve 05/26/2019 1033 by Timoteo Gaul, RN Outcome: Completed/Met 05/26/2019 1031 by Timoteo Gaul, RN Outcome: Progressing Goal: Will remain free from infection 05/26/2019 1033 by Timoteo Gaul, RN Outcome: Completed/Met 05/26/2019 1031 by Timoteo Gaul, RN Outcome: Progressing Goal: Diagnostic test results will improve 05/26/2019 1033 by Timoteo Gaul, RN Outcome: Completed/Met 05/26/2019 1031 by Timoteo Gaul, RN Outcome: Progressing Goal: Respiratory complications will improve 05/26/2019 1033 by Timoteo Gaul, RN Outcome: Completed/Met 05/26/2019 1031 by Timoteo Gaul, RN Outcome: Progressing Goal: Cardiovascular complication will be avoided 05/26/2019 1033 by Timoteo Gaul, RN Outcome: Completed/Met 05/26/2019 1031 by Timoteo Gaul, RN Outcome: Progressing   Problem: Activity: Goal: Risk for activity intolerance will decrease 05/26/2019 1033 by Timoteo Gaul, RN Outcome: Completed/Met 05/26/2019 1031 by Timoteo Gaul, RN Outcome: Progressing   Problem: Nutrition: Goal: Adequate nutrition will be maintained 05/26/2019 1033 by Timoteo Gaul, RN Outcome: Completed/Met 05/26/2019 1031 by Timoteo Gaul, RN Outcome: Progressing   Problem: Coping: Goal: Level of anxiety will decrease 05/26/2019 1033 by Timoteo Gaul, RN Outcome:  Completed/Met 05/26/2019 1031 by Timoteo Gaul, RN Outcome: Progressing   Problem: Elimination: Goal: Will not experience complications related to bowel motility 05/26/2019 1033 by Timoteo Gaul, RN Outcome: Completed/Met 05/26/2019 1031 by Timoteo Gaul, RN Outcome: Progressing Goal: Will not experience complications related to urinary retention 05/26/2019 1033 by Timoteo Gaul, RN Outcome: Completed/Met 05/26/2019 1031 by Timoteo Gaul, RN Outcome: Progressing   Problem: Pain Managment: Goal: General experience of comfort will improve 05/26/2019 1033 by Timoteo Gaul, RN Outcome: Completed/Met 05/26/2019 1031 by Timoteo Gaul, RN Outcome: Progressing   Problem: Safety: Goal: Ability to remain free from injury will improve 05/26/2019 1033 by Timoteo Gaul, RN Outcome: Completed/Met 05/26/2019 1031 by Timoteo Gaul, RN Outcome: Progressing   Problem: Skin Integrity: Goal: Risk for impaired skin integrity will decrease 05/26/2019 1033 by Timoteo Gaul, RN Outcome: Completed/Met 05/26/2019 1031 by Timoteo Gaul, RN Outcome: Progressing

## 2019-05-26 NOTE — Plan of Care (Signed)

## 2019-05-27 LAB — TYPE AND SCREEN
ABO/RH(D): A POS
Antibody Screen: NEGATIVE
Unit division: 0

## 2019-05-27 LAB — BPAM RBC
Blood Product Expiration Date: 202007162359
ISSUE DATE / TIME: 202007021652
Unit Type and Rh: 6200

## 2019-05-27 LAB — CULTURE, BLOOD (ROUTINE X 2)
Culture: NO GROWTH
Culture: NO GROWTH
Special Requests: ADEQUATE
Special Requests: ADEQUATE

## 2019-05-29 ENCOUNTER — Telehealth: Payer: Self-pay

## 2019-05-29 ENCOUNTER — Other Ambulatory Visit: Payer: Self-pay | Admitting: Internal Medicine

## 2019-05-29 LAB — CULTURE, BLOOD (ROUTINE X 2)
Culture: NO GROWTH
Culture: NO GROWTH
Special Requests: ADEQUATE
Special Requests: ADEQUATE

## 2019-05-29 NOTE — Telephone Encounter (Signed)
Called and given below message from Dr. Alvy Bimler. She verbalized understanding. [facility] called and verbal order given to resume home health orders. Offered to call her son and give above message. She declined and will tell him when he gets back. Instructed to call the office is needed.

## 2019-05-29 NOTE — Telephone Encounter (Signed)
-----   Message from [name], MD sent at 05/29/2019  9:22 AM EDT ----- Regarding: FW: how is she feeling Pls call ----- Message ----- From: [name], MD Sent: 05/29/2019   7:59 AM EDT To: Jacqulyn Liner, RN Subject: how is she feeling                             Can you call and check on her how she is feeling since DC?

## 2019-05-29 NOTE — Telephone Encounter (Signed)
Called and left below message. Ask her to call the office back. 

## 2019-05-29 NOTE — Telephone Encounter (Signed)
1) Her PCP just sent refill for her thyroid medication 2) She can take Trazodone for anxiety or for sleep

## 2019-05-29 NOTE — Telephone Encounter (Signed)
Called and given below message. She is doing good. Eating with no problems and getting ready to work with PT. Talked with her son, [name] also.   They need medication clarification. She got Synthroid in the hospital. Should she be taking it? Her Rx at home has expired. On medication profile it is listed. She received Trazodone in the hospital for anxiety. It is not listed on medication profile. She has Rx at home and takes it if needed for anxiety.

## 2019-05-30 ENCOUNTER — Telehealth: Payer: Self-pay | Admitting: *Deleted

## 2019-05-30 NOTE — Telephone Encounter (Signed)
Called patient on 05/30/2019 , 9:33 AM in an attempt to reach the patient for a hospital follow up.   Admit date: 05/22/19 Discharge: 05/26/19   She does not have any questions or concerns about medications from the hospital admission. The patient's medications were reviewed over the phone, they were counseled to bring in all current medications to the hospital follow up visit.   I advised the patient to call if any questions or concerns arise about the hospital admission or medications    Home health was started in the hospital.  All questions were answered and a follow up appointment was made. The patient has a telephone visit on 06/02/2019 with Vicie Mutters, PA.  Prior to Admission medications   Medication Sig Start Date End Date Taking? Authorizing Provider  amoxicillin-clavulanate (AUGMENTIN) 875-125 MG tablet Take 1 tablet by mouth 2 (two) times daily for 6 days. 05/26/19 06/01/19  Rai, Vernelle Emerald, MD  Calcium Carbonate-Vitamin D (CALCIUM 600 + D PO) Take 1 tablet by mouth 2 (two) times daily.     [provider]  Cholecalciferol (VITAMIN D) 2000 units CAPS Take 2,000 Units by mouth every other day.    [provider]  dexamethasone (DECADRON) 4 MG tablet Take 2 mg every 2 days Patient taking differently: Take 2 mg by mouth daily.  05/15/19   [name], MD  furosemide (LASIX) 20 MG tablet Take 1 tablet (20 mg total) by mouth daily as needed for fluid or edema. 05/26/19   Rai, Vernelle Emerald, MD  HYDROcodone-acetaminophen (NORCO/VICODIN) 5-325 MG tablet Take 1-2 tablets by mouth every 6 (six) hours as needed for severe pain. 05/26/19   Rai, Vernelle Emerald, MD  levothyroxine (SYNTHROID) 88 MCG tablet Take 1 tablet daily on an empty stomach with only water for 30 minutes & no Antacid meds, Calcium or Magnesium for 4 hours & avoid Biotin 05/29/19   [name], MD  magic mouthwash w/lidocaine SOLN Swish 58ml by mouth and swallow, three times a day as needed for mouth pain. 05/11/19    Garnet Sierras, NP  metFORMIN (GLUCOPHAGE-XR) 500 MG 24 hr tablet Take 1 tablet (500 mg total) by mouth 2 (two) times daily. 12/06/18   [name], MD  Multiple Vitamin (MULTIVITAMIN WITH MINERALS) TABS tablet Take 1 tablet by mouth daily.    [provider]  pantoprazole (PROTONIX) 40 MG tablet Take 1 tablet (40 mg total) by mouth 2 (two) times daily. 05/26/19 06/25/19  Rai, Ripudeep K, MD  polyethylene glycol (MIRALAX / GLYCOLAX) 17 g packet Take 17 g by mouth daily as needed for moderate constipation. 05/26/19   Rai, Ripudeep K, MD  pravastatin (PRAVACHOL) 40 MG tablet Take 1 tablet (40 mg total) by mouth every evening. 03/02/18   [name], MD  prochlorperazine (COMPAZINE) 10 MG tablet Take 1 tablet (10 mg total) by mouth every 6 (six) hours as needed for nausea or vomiting. 05/26/19   Rai, Vernelle Emerald, MD  traMADol (ULTRAM) 50 MG tablet Take 1 tablet (50 mg total) by mouth 3 (three) times daily. Take 1/2 to 1 tablet every 4 hours as needed for severe Back Pain Patient taking differently: Take 25 mg by mouth every 4 (four) hours as needed for moderate pain or severe pain. Take 1/2 to 1 tablet every 4 hours as needed for severe Back Pain 02/20/19   [name], MD

## 2019-05-31 ENCOUNTER — Telehealth: Payer: Self-pay | Admitting: Internal Medicine

## 2019-05-31 ENCOUNTER — Telehealth: Payer: Self-pay

## 2019-05-31 ENCOUNTER — Encounter: Payer: Self-pay | Admitting: Internal Medicine

## 2019-05-31 LAB — FUNGUS CULTURE, BLOOD: Culture: NO GROWTH

## 2019-05-31 NOTE — Telephone Encounter (Signed)
Son called and left a message to confirm upcoming appts for Monday 7/13 for chemo. He is trying to arrange transportation and is just wondering since she was in the hospital.

## 2019-05-31 NOTE — Telephone Encounter (Signed)
Tammy Hensley called back. They will keep appointments as scheduled for 7/13.

## 2019-05-31 NOTE — Telephone Encounter (Signed)
Called and left below message. Ask him to call the office back. ?

## 2019-05-31 NOTE — Telephone Encounter (Signed)
Family called to request DME for home. faxed order to [facility] for bedside comode and 2 wheeled walker. Per family request. Fax [phone].

## 2019-05-31 NOTE — Telephone Encounter (Signed)
If he feels she is not yet ready we can move her appt to Wed instead Would that be better?

## 2019-06-01 ENCOUNTER — Ambulatory Visit: Payer: Medicare Other | Admitting: Hematology and Oncology

## 2019-06-01 ENCOUNTER — Other Ambulatory Visit: Payer: Medicare Other

## 2019-06-02 ENCOUNTER — Ambulatory Visit: Payer: Medicare Other | Admitting: Adult Health Nurse Practitioner

## 2019-06-02 ENCOUNTER — Other Ambulatory Visit: Payer: Self-pay

## 2019-06-02 ENCOUNTER — Encounter: Payer: Self-pay | Admitting: Adult Health Nurse Practitioner

## 2019-06-02 VITALS — BP 132/80 | HR 78 | Temp 97.9°F | Wt 142.0 lb

## 2019-06-02 DIAGNOSIS — Z9181 History of falling: Secondary | ICD-10-CM

## 2019-06-02 DIAGNOSIS — E440 Moderate protein-calorie malnutrition: Secondary | ICD-10-CM

## 2019-06-02 DIAGNOSIS — E039 Hypothyroidism, unspecified: Secondary | ICD-10-CM

## 2019-06-02 DIAGNOSIS — G9341 Metabolic encephalopathy: Secondary | ICD-10-CM

## 2019-06-02 DIAGNOSIS — R5081 Fever presenting with conditions classified elsewhere: Secondary | ICD-10-CM

## 2019-06-02 DIAGNOSIS — Z09 Encounter for follow-up examination after completed treatment for conditions other than malignant neoplasm: Secondary | ICD-10-CM | POA: Diagnosis not present

## 2019-06-02 DIAGNOSIS — D638 Anemia in other chronic diseases classified elsewhere: Secondary | ICD-10-CM

## 2019-06-02 DIAGNOSIS — E876 Hypokalemia: Secondary | ICD-10-CM

## 2019-06-02 DIAGNOSIS — D709 Neutropenia, unspecified: Secondary | ICD-10-CM

## 2019-06-02 DIAGNOSIS — I10 Essential (primary) hypertension: Secondary | ICD-10-CM

## 2019-06-02 DIAGNOSIS — E782 Mixed hyperlipidemia: Secondary | ICD-10-CM

## 2019-06-02 DIAGNOSIS — Z853 Personal history of malignant neoplasm of breast: Secondary | ICD-10-CM

## 2019-06-02 DIAGNOSIS — G893 Neoplasm related pain (acute) (chronic): Secondary | ICD-10-CM

## 2019-06-02 DIAGNOSIS — C541 Malignant neoplasm of endometrium: Secondary | ICD-10-CM

## 2019-06-02 NOTE — Progress Notes (Signed)
Hospital follow up  Virtual Visit via Telephone Note  I connected with Tammy Hensley on 06/02/19 at 12:00 PM EDT by telephone and verified that I am speaking with the correct person using two identifiers.   I discussed the limitations, risks, security and privacy concerns of performing an evaluation and management service by telephone and the availability of in person appointments. I also discussed with the patient that there may be a patient responsible charge related to this service. The patient expressed understanding and agreed to proceed.      Assessment and Plan:    Diagnoses and all orders for this visit:  Hospital discharge follow-up  Neutropenic fever ([place]) Afebrile today Discussed precautions Broad spectrum ABX  Acute metabolic encephalopathy Will have labs on Monday 06/05/19  Hypokalemia Scheduled labs for 06/05/19  Hypomagnesemia Scheduled labs for 06/05/19  Hypothyroidism, unspecified type Hypothyroidism continue medications the same pending lab results reminded to take on an empty stomach 30-66mins before food.  check TSH level  Essential hypertension Controlled today - continue medications, DASH diet, exercise and monitor at home. Call if greater than 130/80.   Mixed hyperlipidemia Cholesterol Continue medications: Continue low cholesterol diet and exercise.  Check lipid panel next routine visit   Endometrial cancer [facility]) status post robotic hysterectomy and bilateral salpingo-oophorectomy  She follow with Dr Alvy Bimler for this. 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  Has HH, rolling walker in the works Secure foot ware and take time especially with fatigue, take breaks    Follow Up Instructions:  Hospital visit follow up for generalized weakness.  All medications were reviewed with patient and family and fully reconciled. All questions answered fully, and patient and family members were encouraged to call the office with any further questions or concerns. Discussed goal to avoid readmission related to this diagnosis.  There are no discontinued medications.   Over 40 minutes of non face-to-face time for interview, counseling, chart review, and complex, high/moderate level critical decision making was performed this visit.   Future Appointments  Date Time Provider [place]  06/26/2019 10:15 AM [facility] LAB 6 [facility] None  06/26/2019 10:30 AM [facility] [place] FLUSH [facility] None  06/26/2019 11:00 AM [name], MD [facility] None  06/26/2019 11:30 AM [facility] INFUSION [facility] None  07/24/2019  9:00 AM [name], MD [facility] None  04/09/2020  2:00 PM Garnet Sierras, NP [facility] None     HPI 78 y.o.female presents for follow up for transition from recent hospitalization. Admit date to the hospital was 05/22/19, patient was discharged from the hospital on 05/26/19 and our clinical staff contacted the office the day after discharge to set up a follow up appointment. The discharge summary, medications, and diagnostic test results were reviewed before meeting with the patient. The patient was admitted for: generalized weakness and fever with increased confusion.  She had a fall 4 days prior to this admission.  Medications, Maxzide and diuretic discontinued.  CVOID test negative 05/02/19 and 05/22/19, as well a Lactic acid WNL, blood cultures, UA & MRSA negative.  She was diagnosed  with neutropenic fever with  questionable pnuemonia via chest xray.  She is on broad spectrum antibiotics and reports she is tolerating them well.  Report she battles with constipation but this has helped some.    Home health is involved. She is having physical therapy and waiting to start with these activities.  She will be receiving a shower chair, bedside commode to use over toilet in the bathroom. As well as a walker to help her with movement around the home.  She is drinking water and pedylite to stay hydrated.  Her appetite is good and she is eating. She is walking everyday in the house down her long hallway.  She is slowly regaining her strength and resting after her activities.  Images while in the hospital: Ct Head Wo Contrast  Result Date: 05/22/2019 CLINICAL DATA:  78 year old female with increased weakness and fever. 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 EXAM: CT HEAD WITHOUT CONTRAST TECHNIQUE: Contiguous axial images were obtained from the base of the skull through the vertex without intravenous contrast. COMPARISON:  Head and cervical spine CT 05/01/2019 and earlier. FINDINGS: Brain: Stable cerebral volume. Stable gray-white matter differentiation throughout the brain. No midline shift, ventriculomegaly, mass effect, evidence of mass lesion, intracranial hemorrhage or evidence of cortically based acute infarction. No cortical encephalomalacia. Vascular: Calcified atherosclerosis at the skull base. No suspicious intracranial vascular hyperdensity. Skull: Stable. No acute or suspicious osseous lesion. Sinuses/Orbits: Visualized paranasal sinuses and mastoids are stable and well pneumatized. Other: Visualized orbits and scalp soft tissues are within normal limits. IMPRESSION: Stable and negative for age noncontrast CT appearance of the brain. Note that early metastatic disease to the brain cannot be excluded in the absence of intravenous contrast. Electronically Signed   By: Genevie Ann M.D.   On:  05/22/2019 23:32   Dg Chest Port 1 View  Result Date: 05/22/2019 CLINICAL DATA:  78 y/o  F; sepsis. EXAM: PORTABLE CHEST 1 VIEW COMPARISON:  12/05/2018 CT of the chest. FINDINGS: Normal cardiac silhouette. Aortic atherosclerosis with calcification. Right port catheter tip projects over lower SVC. Streaky opacity at the right lung base. No pleural effusion or pneumothorax. No acute osseous abnormality is evident. IMPRESSION: Streaky opacity at the right lung base may represent atelectasis or pneumonia. Electronically Signed   By: Kristine Garbe M.D.   On: 05/22/2019 20:45     Current Outpatient Medications (Endocrine & Metabolic):  .  dexamethasone (DECADRON) 4 MG tablet, Take 2 mg on Mondays and Thursdays only .  levothyroxine (SYNTHROID) 88 MCG tablet, Take 1 tablet daily on an empty stomach with only water for 30 minutes & no Antacid meds, Calcium or Magnesium for 4 hours & avoid Biotin .  metFORMIN (GLUCOPHAGE-XR) 500 MG 24 hr tablet, Take 1 tablet (500 mg total) by mouth 2 (two) times daily.   Current Outpatient Medications (Cardiovascular):  .  furosemide (LASIX) 20 MG tablet, Take 1 tablet (20 mg total) by mouth daily as needed for fluid or edema. .  pravastatin (PRAVACHOL) 40 MG tablet, Take 1 tablet (40 mg total) by mouth every evening.     Current Outpatient Medications (Analgesics):  .  HYDROcodone-acetaminophen (NORCO/VICODIN) 5-325 MG tablet, Take 1-2 tablets by mouth every 6 (six) hours as needed for severe pain. .  traMADol (ULTRAM) 50 MG tablet, Take 1 tablet (50 mg total) by mouth 3 (three) times daily. Take 1/2 to 1 tablet every 4 hours as needed for severe Back Pain (Patient taking differently: Take 25 mg by  mouth every 4 (four) hours as needed for moderate pain or severe pain. Take 1/2 to 1 tablet every 4 hours as needed for severe Back Pain)     Current Outpatient Medications (Other):  Marland Kitchen  Calcium Carbonate-Vitamin D (CALCIUM 600 + D PO), Take 1 tablet by  mouth 2 (two) times daily.  .  Cholecalciferol (VITAMIN D) 2000 units CAPS, Take 2,000 Units by mouth every other day. .  Multiple Vitamin (MULTIVITAMIN WITH MINERALS) TABS tablet, Take 1 tablet by mouth daily. .  pantoprazole (PROTONIX) 40 MG tablet, Take 1 tablet (40 mg total) by mouth 2 (two) times daily. .  polyethylene glycol (MIRALAX / GLYCOLAX) 17 g packet, Take 17 g by mouth daily as needed for moderate constipation. .  prochlorperazine (COMPAZINE) 10 MG tablet, Take 1 tablet (10 mg total) by mouth every 6 (six) hours as needed for nausea or vomiting.   Facility-Administered Medications Ordered in Other Visits (Other):  .  sodium chloride flush (NS) 0.9 % injection 10 mL .  sodium chloride flush (NS) 0.9 % injection 10 mL No current facility-administered medications for this visit.   Past Medical History:  Diagnosis Date  . 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 Breast cancer ([place]) 2006   left breast  . Depression   . Diabetes ([place])    borderline  . Endometrial ca ([place]) 2018  . Family history of cancer   . History of brachytherapy 03/29/17-04/12/17   vaginal cuff 18 Gy in 3 fractions  . History of radiation therapy 02/10/17-03/16/17   pelvis 45 Gy in 25 fractions  . Hyperlipidemia   . Hypertension   . Iron deficiency anemia   . 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 Vitamin D deficiency      Allergies  Allergen Reactions  . Omnipaque [Iohexol] Hives    Pt will need 13 hr prep in the future--SLG  . Calan [Verapamil]     Constipation   . Effexor [Venlafaxine]     unknown  . Erythromycin Nausea And Vomiting  . Femara [Letrozole] Swelling  . Fosamax [Alendronate]     aching  . Ibuprofen     Dizziness, incoherent   . Paxil [Paroxetine Hcl] Nausea Only  . Remeron [Mirtazapine]     Weight gain   . Vasotec [Enalapril] Cough    ROS: all negative except above.   Physical Exam: Filed Weights   06/02/19 1205  Weight: 142 lb  (64.4 kg)    BP 132/80   Pulse 78   Temp 97.9 F (36.6 C)   Wt 142 lb (64.4 kg)   BMI 25.97 kg/m   General : Speech is slow, in no apparent distress. HEENT: no hoarseness, no cough for duration of visit Lungs: speaks in complete sentences, no audible wheezing, no apparent distress Neurological: alert, oriented x 3 Psychiatric: pleasant, judgement appropriate    Garnet Sierras, NP 6:09 PM [facility]

## 2019-06-05 ENCOUNTER — Inpatient Hospital Stay (HOSPITAL_BASED_OUTPATIENT_CLINIC_OR_DEPARTMENT_OTHER): Payer: Medicare Other | Admitting: Hematology and Oncology

## 2019-06-05 ENCOUNTER — Encounter: Payer: Self-pay | Admitting: Hematology and Oncology

## 2019-06-05 ENCOUNTER — Inpatient Hospital Stay: Payer: Medicare Other

## 2019-06-05 ENCOUNTER — Other Ambulatory Visit: Payer: Self-pay

## 2019-06-05 ENCOUNTER — Other Ambulatory Visit: Payer: Self-pay | Admitting: Hematology and Oncology

## 2019-06-05 ENCOUNTER — Inpatient Hospital Stay: Payer: Medicare Other | Attending: Hematology and Oncology

## 2019-06-05 VITALS — HR 93

## 2019-06-05 DIAGNOSIS — C7951 Secondary malignant neoplasm of bone: Secondary | ICD-10-CM | POA: Diagnosis not present

## 2019-06-05 DIAGNOSIS — D6481 Anemia due to antineoplastic chemotherapy: Secondary | ICD-10-CM

## 2019-06-05 DIAGNOSIS — Z5111 Encounter for antineoplastic chemotherapy: Secondary | ICD-10-CM | POA: Insufficient documentation

## 2019-06-05 DIAGNOSIS — D703 Neutropenia due to infection: Secondary | ICD-10-CM | POA: Insufficient documentation

## 2019-06-05 DIAGNOSIS — C787 Secondary malignant neoplasm of liver and intrahepatic bile duct: Secondary | ICD-10-CM

## 2019-06-05 DIAGNOSIS — T451X5A Adverse effect of antineoplastic and immunosuppressive drugs, initial encounter: Secondary | ICD-10-CM

## 2019-06-05 DIAGNOSIS — C541 Malignant neoplasm of endometrium: Secondary | ICD-10-CM | POA: Insufficient documentation

## 2019-06-05 DIAGNOSIS — Z7189 Other specified counseling: Secondary | ICD-10-CM

## 2019-06-05 DIAGNOSIS — G893 Neoplasm related pain (acute) (chronic): Secondary | ICD-10-CM | POA: Diagnosis not present

## 2019-06-05 DIAGNOSIS — R600 Localized edema: Secondary | ICD-10-CM

## 2019-06-05 DIAGNOSIS — R609 Edema, unspecified: Secondary | ICD-10-CM

## 2019-06-05 DIAGNOSIS — C7801 Secondary malignant neoplasm of right lung: Secondary | ICD-10-CM

## 2019-06-05 DIAGNOSIS — Z5189 Encounter for other specified aftercare: Secondary | ICD-10-CM | POA: Insufficient documentation

## 2019-06-05 LAB — CBC WITH DIFFERENTIAL (CANCER CENTER ONLY)
Abs Immature Granulocytes: 0.04 10*3/uL (ref 0.00–0.07)
Basophils Absolute: 0 10*3/uL (ref 0.0–0.1)
Basophils Relative: 0 %
Eosinophils Absolute: 0 10*3/uL (ref 0.0–0.5)
Eosinophils Relative: 0 %
HCT: 30.9 % — ABNORMAL LOW (ref 36.0–46.0)
Hemoglobin: 9.9 g/dL — ABNORMAL LOW (ref 12.0–15.0)
Immature Granulocytes: 1 %
Lymphocytes Relative: 4 %
Lymphs Abs: 0.3 10*3/uL — ABNORMAL LOW (ref 0.7–4.0)
MCH: 25.8 pg — ABNORMAL LOW (ref 26.0–34.0)
MCHC: 32 g/dL (ref 30.0–36.0)
MCV: 80.7 fL (ref 80.0–100.0)
Monocytes Absolute: 0.3 10*3/uL (ref 0.1–1.0)
Monocytes Relative: 3 %
Neutro Abs: 7.3 10*3/uL (ref 1.7–7.7)
Neutrophils Relative %: 92 %
Platelet Count: 423 10*3/uL — ABNORMAL HIGH (ref 150–400)
RBC: 3.83 MIL/uL — ABNORMAL LOW (ref 3.87–5.11)
RDW: 22.6 % — ABNORMAL HIGH (ref 11.5–15.5)
WBC Count: 7.9 10*3/uL (ref 4.0–10.5)
nRBC: 0 % (ref 0.0–0.2)

## 2019-06-05 LAB — CMP (CANCER CENTER ONLY)
ALT: 18 U/L (ref 0–44)
AST: 22 U/L (ref 15–41)
Albumin: 2.8 g/dL — ABNORMAL LOW (ref 3.5–5.0)
Alkaline Phosphatase: 200 U/L — ABNORMAL HIGH (ref 38–126)
Anion gap: 11 (ref 5–15)
BUN: 12 mg/dL (ref 8–23)
CO2: 30 mmol/L (ref 22–32)
Calcium: 9.5 mg/dL (ref 8.9–10.3)
Chloride: 97 mmol/L — ABNORMAL LOW (ref 98–111)
Creatinine: 0.66 mg/dL (ref 0.44–1.00)
GFR, Est AFR Am: >60 mL/min (ref >60)
GFR, Estimated: >60 mL/min (ref >60)
Glucose, Bld: 173 mg/dL — ABNORMAL HIGH (ref 70–99)
Potassium: 3.5 mmol/L (ref 3.5–5.1)
Sodium: 138 mmol/L (ref 135–145)
Total Bilirubin: 0.4 mg/dL (ref 0.3–1.2)
Total Protein: 6.4 g/dL — ABNORMAL LOW (ref 6.5–8.1)

## 2019-06-05 LAB — SAMPLE TO BLOOD BANK

## 2019-06-05 MED ORDER — SODIUM CHLORIDE 0.9 % IV SOLN
25.0000 mg/m2 | Freq: Once | INTRAVENOUS | Status: AC
  Administered 2019-06-05: 40 mg via INTRAVENOUS
  Filled 2019-06-05: qty 4

## 2019-06-05 MED ORDER — SODIUM CHLORIDE 0.9% FLUSH
10.0000 mL | Freq: Once | INTRAVENOUS | Status: AC
  Administered 2019-06-05: 10 mL
  Filled 2019-06-05: qty 10

## 2019-06-05 MED ORDER — SODIUM CHLORIDE 0.9 % IV SOLN
Freq: Once | INTRAVENOUS | Status: AC
  Administered 2019-06-05: 12:00:00 via INTRAVENOUS
  Filled 2019-06-05: qty 250

## 2019-06-05 MED ORDER — SODIUM CHLORIDE 0.9% FLUSH
10.0000 mL | INTRAVENOUS | Status: DC | PRN
  Administered 2019-06-05: 10 mL
  Filled 2019-06-05: qty 10

## 2019-06-05 MED ORDER — HEPARIN SOD (PORK) LOCK FLUSH 100 UNIT/ML IV SOLN
500.0000 [IU] | Freq: Once | INTRAVENOUS | Status: AC | PRN
  Administered 2019-06-05: 500 [IU]
  Filled 2019-06-05: qty 5

## 2019-06-05 MED ORDER — DEXAMETHASONE SODIUM PHOSPHATE 10 MG/ML IJ SOLN
10.0000 mg | Freq: Once | INTRAMUSCULAR | Status: AC
  Administered 2019-06-05: 10 mg via INTRAVENOUS

## 2019-06-05 MED ORDER — DEXAMETHASONE 4 MG PO TABS: ORAL_TABLET | ORAL | 0 refills | Status: DC

## 2019-06-05 MED ORDER — DEXAMETHASONE SODIUM PHOSPHATE 10 MG/ML IJ SOLN
INTRAMUSCULAR | Status: AC
  Filled 2019-06-05: qty 1

## 2019-06-05 NOTE — Assessment & Plan Note (Signed)
She has received blood transfusion recently We will monitor her blood counts carefully She does not need transfusion support today

## 2019-06-05 NOTE — Assessment & Plan Note (Signed)
She has minimum pain She has medicine to take as needed for pain

## 2019-06-05 NOTE — Patient Instructions (Addendum)
Continue to monitor your blood pressure and temperature.  Your blood pressure is in normal range today.  Temperature 101.4 or greater please contact office.  Continue your current eating habits, small frequent meals.  Continue the Pedialyte & water to stay hydrated.  Good work with your physical therapy, continue this to improve your endurance.  Contact office with any questions or concerns.  We will check in on labs drawn with Dr Alvy Bimler.

## 2019-06-05 NOTE — Assessment & Plan Note (Signed)
I have reviewed CT imaging with the patient and her son in great detail Even though on the report, it appears that the tumor is enlarged compared to prior imaging, it has become necrotic We discussed the rationale of why we really order CT imaging this soon after chemotherapy due to this phenomenon of necrosis I believe the tumor necrosis is another reason why she had neutropenic fever We discussed options Option #1 would include continuing Taxotere at further dose adjustment and G-CSF support Option #2 would include changing her treatment to different chemotherapy We have extensive discussion about risk and benefit of each option and ultimately, she agreed to try further doses of Taxotere at reduced dose and along with G-CSF support I will get insurance prior authorization to get G-CSF support I will see her again this Friday for repeat blood count, supportive care and G-CSF injection

## 2019-06-05 NOTE — Assessment & Plan Note (Signed)
The tumor is becoming necrotic but not necessarily means she is not responding to treatment We will proceed with 2 or 3 more doses of chemo before repeating imaging study

## 2019-06-05 NOTE — Patient Instructions (Signed)
Coronavirus (COVID-19) Are you at risk?  Are you at risk for the Coronavirus (COVID-19)?  To be considered HIGH RISK for Coronavirus (COVID-19), you have to meet the following criteria:  . Traveled to [place], [place], [place], [place] or [place]; or in the [place] to [place], [place], [place], or [place]; and have fever, cough, and shortness of breath within the last 2 weeks of travel OR . Been in close contact with a person diagnosed with COVID-19 within the last 2 weeks and have fever, cough, and shortness of breath . IF YOU DO NOT MEET THESE CRITERIA, YOU ARE CONSIDERED LOW RISK FOR COVID-19.  What to do if you are HIGH RISK for COVID-19?  . If you are having a medical emergency, call 911. . Seek medical care right away. Before you go to a doctor's office, urgent care or emergency department, call ahead and tell them about your recent travel, contact with someone diagnosed with COVID-19, and your symptoms. You should receive instructions from your physician's office regarding next steps of care.  . When you arrive at healthcare provider, tell the healthcare staff immediately you have returned from visiting [place], [place], [place], [place] or [place]; or traveled in the [place] to [place], [place], [place], or [place]; in the last two weeks or you have been in close contact with a person diagnosed with COVID-19 in the last 2 weeks.   . Tell the health care staff about your symptoms: fever, cough and shortness of breath. . After you have been seen by a medical provider, you will be either: o Tested for (COVID-19) and discharged home on quarantine except to seek medical care if symptoms worsen, and asked to  - Stay home and avoid contact with others until you get your results (4-5 days)  - Avoid travel on public transportation if possible (such as bus, train, or airplane) or o Sent to the Emergency Department by EMS for evaluation, COVID-19 testing, and possible  admission depending on your condition and test results.  What to do if you are LOW RISK for COVID-19?  Reduce your risk of any infection by using the same precautions used for avoiding the common cold or flu:  . Wash your hands often with soap and warm water for at least 20 seconds.  If soap and water are not readily available, use an alcohol-based hand sanitizer with at least 60% alcohol.  . If coughing or sneezing, cover your mouth and nose by coughing or sneezing into the elbow areas of your shirt or coat, into a tissue or into your sleeve (not your hands). . Avoid shaking hands with others and consider head nods or verbal greetings only. . Avoid touching your eyes, nose, or mouth with unwashed hands.  . Avoid close contact with people who are sick. . Avoid places or events with large numbers of people in one location, like concerts or sporting events. . Carefully consider travel plans you have or are making. . If you are planning any travel outside or inside the [place], visit the CDC's Travelers' Health webpage for the latest health notices. . If you have some symptoms but not all symptoms, continue to monitor at home and seek medical attention if your symptoms worsen. . If you are having a medical emergency, call 911.   ADDITIONAL HEALTHCARE OPTIONS FOR PATIENTS  [facility] / e-Visit: [email or web address]         [facility]: [phone]  Onset   Urgent Care: [facility]: [place] Discharge Instructions for Patients Receiving Chemotherapy  Today you received the following chemotherapy agents Taxotere  To help prevent nausea and vomiting after your treatment, we encourage you to take your nausea medication as directed.    If you develop nausea and vomiting that is not controlled by your nausea medication, call the clinic.   BELOW ARE  SYMPTOMS THAT SHOULD BE REPORTED IMMEDIATELY:  *FEVER GREATER THAN 100.5 F  *CHILLS WITH OR WITHOUT FEVER  NAUSEA AND VOMITING THAT IS NOT CONTROLLED WITH YOUR NAUSEA MEDICATION  *UNUSUAL SHORTNESS OF BREATH  *UNUSUAL BRUISING OR BLEEDING  TENDERNESS IN MOUTH AND THROAT WITH OR WITHOUT PRESENCE OF ULCERS  *URINARY PROBLEMS  *BOWEL PROBLEMS  UNUSUAL RASH Items with * indicate a potential emergency and should be followed up as soon as possible.  Feel free to call the clinic should you have any questions or concerns. The clinic phone number is [phone].  Please show the [facility] at check-in to the Emergency Department and triage nurse.

## 2019-06-05 NOTE — Patient Instructions (Signed)

## 2019-06-05 NOTE — Progress Notes (Signed)
[place] OFFICE PROGRESS NOTE  Patient Care Team: [name], MD as PCP - General (Internal Medicine) [name], MD as Consulting Physician (Gastroenterology) [name], MD as Consulting Physician (Ophthalmology) [name], [name], MD as Consulting Physician (Obstetrics and Gynecology) [name], MD as Consulting Physician (Hematology and Oncology) [name], MD as Consulting Physician (Radiation Oncology)  ASSESSMENT & PLAN:  Endometrial cancer [facility]) I have reviewed CT imaging with the patient and her son in great detail Even though on the report, it appears that the tumor is enlarged compared to prior imaging, it has become necrotic We discussed the rationale of why we really order CT imaging this soon after chemotherapy due to this phenomenon of necrosis I believe the tumor necrosis is another reason why she had neutropenic fever We discussed options Option #1 would include continuing Taxotere at further dose adjustment and G-CSF support Option #2 would include changing her treatment to different chemotherapy We have extensive discussion about risk and benefit of each option and ultimately, she agreed to try further doses of Taxotere at reduced dose and along with G-CSF support I will get insurance prior authorization to get G-CSF support I will see her again this Friday for repeat blood count, supportive care and G-CSF injection  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 has minimum pain She has medicine to take as needed for pain  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[facility]) The tumor is becoming necrotic but not necessarily means she is not responding to treatment We will proceed with 2 or 3 more doses of chemo before repeating imaging study  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   INTERVAL HISTORY: Please see below for problem oriented charting. 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 SEE COMMENT.  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 The greatest extent of invasive carcinoma as measured on the needle core biopsy measures 0.6 cm.     Endometrial cancer ([place])  02/03/2002 Pathology Results   1. BENIGN ENDOMETRIAL POLYPS.  2. UTERINE FIBROIDS: PORTIONS OF SMOOTH MUSCLE CONSISTENT WITH BENIGN LEIOMYOMA(S). PORTIONS OF BENIGN, CYSTICALLY ATROPHIC ENDOMETRIUM WITHOUT HYPERPLASIA OR EVIDENCE OF MALIGNANCY. 3. ENDOMETRIAL CURETTAGE: BENIGN ENDOMETRIUM AND SMOOTH MUSCLE.   09/07/2016 Pathology Results   PAP  smear positive for malignant cells   09/07/2016 Initial Diagnosis   Patient had postmenopausal bleeding in  08-2016. She had PAP 09-07-16 by PCP Dr [name], which documented carcinoma. She had evaluation by Dr [name] with colposcopy/ ECC/endometrial biopsy documenting serous endometrioid endometrial carcinoma.   09/15/2016 Imaging   Ct imaging showed markedly thickened and heterogeneous endometrial stripe suspicious for endometrial carcinoma in this patient with postmenopausal vaginal bleeding. Cystic lesion in the right ovary cannot be definitively characterized. Pelvic ultrasound is recommended for further evaluation. Aortoiliac atherosclerosis. Avascular necrosis of the femoral heads bilaterally.   10/07/2016 Tumor Marker   Patient's tumor was tested for the following markers: CA125 Results of the tumor marker test revealed 15.1   10/13/2016 Pathology Results   1. Lymph node, sentinel, biopsy, right obturator - THERE IS NO EVIDENCE OF CARCINOMA IN 8 OF 8 LYMPH NODES (0/8). - SEE COMMENT. 2. Lymph node, sentinel, biopsy, left obturator - THERE IS NO EVIDENCE OF CARCINOMA IN 1 OF 1 LYMPH NODE (0/1). - SEE COMMENT. 3. Lymph node, sentinel, biopsy, left para-aortic - THERE IS NO EVIDENCE OF CARCINOMA IN 4 OF 4 LYMPH NODES (0/4). - SEE COMMENT. 4. Uterus +/- tubes/ovaries, neoplastic, cervix - INVASIVE MIXED SEROUS/ENDOMETRIOID ADENOCARCINOMA, MULTIPLE FOCI, FIGO GRADE III, THE LARGEST FOCUS SPANS 8.2 CM. - ADENOCARCINOMA EXTENDS INTO THE OUTER HALF OF THE MYOMETRIUM AND INVOLVES THE STROMA OF THE LOWER UTERINE SEGMENT AND CERVIX. - LYMPHOVASCULAR INVASION IS IDENTIFIED. - THE SURGICAL RESECTION MARGINS ARE NEGATIVE FOR CARCINOMA. - SEE ONCOLOGY TABLE BELOW. ADDITIONAL FINDINGS: - ENDOMETRIUM: ENDOMETRIOID TYPE POLYP(S), WHICH CONTAIN FOCI OF SIMILAR APPEARING MIXED SEROUS/ENDOMETRIOID ADENOCARCINOMA. - MYOMETRIUM: LEIOMYOMATA. - SEROSA: UNREMARKABLE. - BILATERAL ADNEXA: BENIGN OVARIES AND FALLOPIAN TUBES.   10/13/2016 Surgery   Surgery: Total robotic hysterectomy bilateral  salpingo-oophorectomy, bilateral pelvic sentinel lymph node removal, left PA sentinel lymph node removal. Mini-laparotomy for specimen removal Surgeons:  [name], MD; [name], MD  Pathology:  1)Uterus, cervix, bilateral tubes and ovaries 2) Right obturator SLN 3) Left Obturator SLN 4) Left PA SLN Operative findings: 14 week fibroid uterus with one large dominant 6 cm myoma that was palpably calcified. 4 cm right ovarian cyst. + SLN identified in the bilateral obturator spaces, + SLN identified in the left PA space.    12/01/2016 - 05/31/2017 Chemotherapy   She received carboplatin & Taxol. She had 3 cycles of chemo followed by radiation and then 3 more cycles of chemo   01/11/2017 Genetic Testing   Testing was normal and did not reveal a mutation in these genes: The genes tested were the 80 genes on [employer]'s Multi-Cancer panel (ALK, APC, ATM, AXIN2, BAP1, BARD1, BLM, BMPR1A, BRCA1, BRCA2, BRIP1, CASR, CDC73, CDH1, CDK4, CDKN1B, CDKN1C, CDKN2A, CEBPA, CHEK2, DICER1, DIS3L2, EGFR, EPCAM, FH, FLCN, GATA2, GPC3, GREM1, HOXB13,  HRAS, KIT, MAX, MEN1, MET, MITF, MLH1, MSH2, MSH6, MUTYH, NBN, NF1, NF2, PALB2, PDGFRA, PHOX2B, PMS2, POLD1, POLE, POT1, PRKAR1A, PTCH1, PTEN, RAD50, RAD51C, RAD51D, RB1, RECQL4, RET, RUNX1, SDHA, SDHAF2, SDHB, SDHC, SDHD, SMAD4, SMARCA4, SMARCB1, SMARCE1, STK11, SUFU, TERC, TERT, TMEM127, TP53, TSC1, TSC2, VHL, WRN, and WT1).   02/10/2017 - 04/12/2017 Radiation Therapy   02/10/17-03/16/17; 03/29/17-04/12/17;  1) Pelvis/ 45 Gy in 25 fractions  2) Vaginal Cuff/ 18 Gy in 3 fractions   08/26/2018 Imaging   Large destructive mass lesion left iliac bone with large soft tissue mass extending into the posterior soft tissues and pelvis compatible with metastatic disease. Consider follow-up CT chest abdomen pelvis with contrast for further staging.  Radiation changes at L5 and the sacrum. No lumbar metastatic deposits  Multilevel degenerative changes above.    08/29/2018 Imaging   1. 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 2. New irregular nodular opacity in the medial right middle lobe, new since 2017 CT. Separate subcentimeter right upper lobe solid pulmonary nodule, for which no comparison exists. These findings are indeterminate for pulmonary metastases and attention on follow-up chest CT in 3 months is recommended. 3. No lymphadenopathy or additional findings of metastatic disease.No tumor recurrence at the hysterectomy margin. 4. Aortic Atherosclerosis (ICD10-I70.0). Additional chronic findings as detailed.   08/29/2018 Tumor Marker   Patient's tumor was tested for the following markers: CA-125 Results of the tumor marker test revealed 17.4   09/06/2018 Procedure   CT-guided core biopsy performed of huge mass in the left iliac fossa destroying the left iliac bone.   09/06/2018 Pathology Results   Soft Tissue Needle Core Biopsy, Left Iliac Fossa - POORLY DIFFERENTIATED MALIGNANT NEOPLASM. Microscopic Comment The provided clinical history of both endometrial and breast cancer is noted. In the current case, immunohistochemistry for qualitative ER demonstrates scattered positive staining. CK8/18 is focally positive. CKAE1/3, CK7, CK20, TTF-1, CDX-2, GATA3 and PAX 8 are negative. The immunophenotype is not specific as to an origin for this poorly differentiated malignant neoplasm. The morphology is more suggestive of endometrial cancer than breast cancer, and may represent an unrecognized carcinosarcomatous component.   09/08/2018 - 09/27/2018 Radiation Therapy   She had palliative radiation treatment   09/29/2018 Procedure   Placement of single lumen port a cath via right internal jugular vein. The catheter tip lies at the cavo-atrial junction. A power injectable port a cath was placed and is ready for immediate use.   10/04/2018 - 01/17/2019 Chemotherapy   The patient had carboplatin and taxol x 6  cycles   12/05/2018 Tumor Marker   Patient's tumor was tested for the following markers: CA125 Results of the tumor marker test revealed 5.5   12/05/2018 Imaging   1. Considerable cystic degeneration in the large destructive mass of the left iliac bone. New or progressive left iliac bone pathologic fracture extending into the left SI joint, along with abnormal subluxation at the pubic symphysis indicating pubic symphysis instability. 2. Previous pleural-based nodularity in the right middle lobe is markedly improved, currently 4 mm in thickness and previously 11 mm in thickness. 3. Indistinct new wedge-shaped lesion in the left mid kidney, hypodense on portal venous phase images and hyperdense on delayed phase images, suggesting prolong tubular transit. This is a nonspecific finding but could reflect local inflammation, infection, or less likely infiltrative tumor. 4. Other imaging findings of potential clinical significance: Aortic Atherosclerosis (ICD10-I70.0). Left main coronary artery atherosclerosis. 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   02/15/2019 Imaging   1. Slight progression of large expansile lytic lesion involving the left iliac bone. This demonstrates central low density, suggesting partial necrosis. This lesion was biopsied on 09/06/2018, revealing poorly differentiated malignant neoplasm. 2. New low-density lesion in the dome of the right hepatic lobe worrisome for cystic or treated metastasis. 3. No other evidence of metastatic disease. No explanation for the patient's symptoms. 4. 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    03/03/2019 - 04/07/2019 Anti-estrogen oral therapy   She cannot tolerate tamoxifen and megace   03/23/2019 Tumor Marker   Patient's tumor was  tested for the following markers: CA125 Results of the tumor marker  test revealed 3.8   04/13/2019 Imaging   1. 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 2. Mild decrease in size of large lytic bone metastasis and associated soft tissue mass involving the left ilium. 3. Unstable pathologic fracture of the left ilium, without significant change since prior exam. 4. New right lower lobe pulmonary metastases.     04/24/2019 -  Chemotherapy   The patient had DOCEtaxel (TAXOTERE) 130 mg in sodium chloride 0.9 % 250 mL chemo infusion, 75 mg/m2 = 130 mg, Intravenous,  Once, 3 of 4 cycles Dose modification: 50 mg/m2 (66.7 % of original dose 75 mg/m2, Cycle 2, Reason: Dose Not Tolerated), 45 mg/m2 (60 % of original dose 75 mg/m2, Cycle 2, Reason: Dose Not Tolerated), 25 mg/m2 (33.3 % of original dose 75 mg/m2, Cycle 3, Reason: Dose Not Tolerated) Administration: 130 mg (04/24/2019), 80 mg (05/15/2019)  for chemotherapy treatment.    05/01/2019 - 05/03/2019 Hospital Admission   She was admitted to the hospital for evaluation of fever and weakness   05/22/2019 - 05/26/2019 Hospital Admission   She was admitted to the hospital with recurrent neutropenic fever with no source found   05/26/2019 Imaging   1. Enlarging large necrotic hepatic mass. 2. 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 3. New small right pleural effusion and bibasilar atelectasis   Metastasis to bone ([place])  04/24/2019 -  Chemotherapy   The patient had DOCEtaxel (TAXOTERE) 130 mg in sodium chloride 0.9 % 250 mL chemo infusion, 75 mg/m2 = 130 mg, Intravenous,  Once, 3 of 4 cycles Dose modification: 50 mg/m2 (66.7 % of original dose 75 mg/m2, Cycle 2, Reason: Dose Not Tolerated), 45 mg/m2 (60 % of original dose 75 mg/m2, Cycle 2, Reason: Dose Not Tolerated), 25 mg/m2 (33.3 % of original dose 75 mg/m2, Cycle 3, Reason: Dose Not Tolerated) Administration: 130 mg (04/24/2019), 80 mg (05/15/2019)  for  chemotherapy treatment.    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([place])  02/16/2019 Initial Diagnosis   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([place])   04/24/2019 -  Chemotherapy   The patient had DOCEtaxel (TAXOTERE) 130 mg in sodium chloride 0.9 % 250 mL chemo infusion, 75 mg/m2 = 130 mg, Intravenous,  Once, 3 of 4 cycles Dose modification: 50 mg/m2 (66.7 % of original dose 75 mg/m2, Cycle 2, Reason: Dose Not Tolerated), 45 mg/m2 (60 % of original dose 75 mg/m2, Cycle 2, Reason: Dose Not Tolerated), 25 mg/m2 (33.3 % of original dose 75 mg/m2, Cycle 3, Reason: Dose Not Tolerated) Administration: 130 mg (04/24/2019), 80 mg (05/15/2019)  for chemotherapy treatment.      REVIEW OF SYSTEMS:   Constitutional: Denies fevers, chills or abnormal weight loss Eyes: Denies blurriness of vision Ears, nose, mouth, throat, and face: Denies mucositis or sore throat Respiratory: Denies cough, dyspnea or wheezes Cardiovascular: Denies palpitation, chest discomfort or lower extremity swelling Gastrointestinal:  Denies nausea, heartburn or change in bowel habits Skin: Denies abnormal skin rashes Lymphatics: Denies new lymphadenopathy or easy bruising Neurological:Denies numbness, tingling or new weaknesses Behavioral/Psych: Mood is stable, no new changes  All other systems were reviewed with the patient and are negative.  I have reviewed the past medical history, past surgical history, social history and family history with the patient and they are unchanged from previous note.  ALLERGIES:  is allergic to omnipaque [iohexol]; calan [verapamil]; effexor [  venlafaxine]; erythromycin; femara [letrozole]; fosamax [alendronate]; ibuprofen; paxil [paroxetine hcl]; remeron [mirtazapine]; and vasotec [enalapril].  MEDICATIONS:  Current Outpatient Medications  Medication Sig Dispense Refill  . Calcium Carbonate-Vitamin D (CALCIUM 600 + D PO) Take 1 tablet by mouth 2 (two) times daily.     . Cholecalciferol (VITAMIN D) 2000 units CAPS Take 2,000 Units by  mouth every other day.    Marland Kitchen dexamethasone (DECADRON) 4 MG tablet Take 2 mg on Mondays and Thursdays only 60 tablet 0  . furosemide (LASIX) 20 MG tablet Take 1 tablet (20 mg total) by mouth daily as needed for fluid or edema. 30 tablet 1  . HYDROcodone-acetaminophen (NORCO/VICODIN) 5-325 MG tablet Take 1-2 tablets by mouth every 6 (six) hours as needed for severe pain. 30 tablet 0  . levothyroxine (SYNTHROID) 88 MCG tablet Take 1 tablet daily on an empty stomach with only water for 30 minutes & no Antacid meds, Calcium or Magnesium for 4 hours & avoid Biotin 90 tablet 3  . metFORMIN (GLUCOPHAGE-XR) 500 MG 24 hr tablet Take 1 tablet (500 mg total) by mouth 2 (two) times daily. 360 tablet 1  . Multiple Vitamin (MULTIVITAMIN WITH MINERALS) TABS tablet Take 1 tablet by mouth daily.    . pantoprazole (PROTONIX) 40 MG tablet Take 1 tablet (40 mg total) by mouth 2 (two) times daily. 60 tablet 0  . polyethylene glycol (MIRALAX / GLYCOLAX) 17 g packet Take 17 g by mouth daily as needed for moderate constipation. 30 each 0  . pravastatin (PRAVACHOL) 40 MG tablet Take 1 tablet (40 mg total) by mouth every evening. 90 tablet 1  . prochlorperazine (COMPAZINE) 10 MG tablet Take 1 tablet (10 mg total) by mouth every 6 (six) hours as needed for nausea or vomiting. 30 tablet 1  . traMADol (ULTRAM) 50 MG tablet Take 1 tablet (50 mg total) by mouth 3 (three) times daily. Take 1/2 to 1 tablet every 4 hours as needed for severe Back Pain (Patient taking differently: Take 25 mg by mouth every 4 (four) hours as needed for moderate pain or severe pain. Take 1/2 to 1 tablet every 4 hours as needed for severe Back Pain) 90 tablet 0   No current facility-administered medications for this visit.    Facility-Administered Medications Ordered in Other Visits  Medication Dose Route Frequency Provider Last Rate Last Dose  . DOCEtaxel (TAXOTERE) 40 mg in sodium chloride 0.9 % 100 mL chemo infusion  25 mg/m2 (Treatment Plan Recorded)  Intravenous Once Alvy Bimler, Joeline Freer, MD 104 mL/hr at 06/05/19 1248 40 mg at 06/05/19 1248  . heparin lock flush 100 unit/mL  500 Units Intracatheter Once PRN Alvy Bimler, Marquette Piontek, MD      . sodium chloride flush (NS) 0.9 % injection 10 mL  10 mL Intracatheter PRN Alvy Bimler, Jashley Yellin, MD   10 mL at 12/27/18 1825  . sodium chloride flush (NS) 0.9 % injection 10 mL  10 mL Intracatheter PRN Alvy Bimler, Dannell Gortney, MD        PHYSICAL EXAMINATION: ECOG PERFORMANCE STATUS: 2 - Symptomatic, <50% confined to bed  Vitals:   06/05/19 1032  BP: 120/80  Pulse: (!) 103  Resp: 18  Temp: (!) 97.1 F (36.2 C)  SpO2: 98%   Filed Weights   06/05/19 1032  Weight: 141 lb 12.8 oz (64.3 kg)    GENERAL:alert, no distress and comfortable SKIN: skin color, texture, turgor are normal, no rashes or significant lesions EYES: normal, Conjunctiva are pink and non-injected, sclera clear OROPHARYNX:no exudate, no erythema  and lips, buccal mucosa, and tongue normal  NECK: supple, thyroid normal size, non-tender, without nodularity LYMPH:  no palpable lymphadenopathy in the cervical, axillary or inguinal LUNGS: clear to auscultation and percussion with normal breathing effort HEART: regular rate & rhythm and no murmurs with moderate bilateral lower extremity edema ABDOMEN:abdomen soft, non-tender and normal bowel sounds Musculoskeletal:no cyanosis of digits and no clubbing  NEURO: alert & oriented x 3 with fluent speech, no focal motor/sensory deficits  LABORATORY DATA:  I have reviewed the data as listed    Component Value Date/Time   NA 138 06/05/2019 1005   NA 137 05/31/2017 0931   K 3.5 06/05/2019 1005   K 3.5 05/31/2017 0931   CL 97 (L) 06/05/2019 1005   CL 99 12/20/2012 1336   CO2 30 06/05/2019 1005   CO2 25 05/31/2017 0931   GLUCOSE 173 (H) 06/05/2019 1005   GLUCOSE 161 (H) 05/31/2017 0931   GLUCOSE 110 (H) 12/20/2012 1336   BUN 12 06/05/2019 1005   BUN 10.9 05/31/2017 0931   CREATININE 0.66 06/05/2019 1005   CREATININE 0.63  07/05/2018 1009   CREATININE 0.8 05/31/2017 0931   CALCIUM 9.5 06/05/2019 1005   CALCIUM 10.6 (H) 05/31/2017 0931   PROT 6.4 (L) 06/05/2019 1005   PROT 7.5 05/31/2017 0931   ALBUMIN 2.8 (L) 06/05/2019 1005   ALBUMIN 4.2 05/31/2017 0931   AST 22 06/05/2019 1005   AST 16 05/31/2017 0931   ALT 18 06/05/2019 1005   ALT 15 05/31/2017 0931   ALKPHOS 200 (H) 06/05/2019 1005   ALKPHOS 91 05/31/2017 0931   BILITOT 0.4 06/05/2019 1005   BILITOT 0.33 05/31/2017 0931   GFRNONAA >60 06/05/2019 1005   GFRNONAA 87 07/05/2018 1009   GFRAA >60 06/05/2019 1005   GFRAA 100 07/05/2018 1009    No results found for: SPEP, UPEP  Lab Results  Component Value Date   WBC 7.9 06/05/2019   NEUTROABS 7.3 06/05/2019   HGB 9.9 (L) 06/05/2019   HCT 30.9 (L) 06/05/2019   MCV 80.7 06/05/2019   PLT 423 (H) 06/05/2019      Chemistry      Component Value Date/Time   NA 138 06/05/2019 1005   NA 137 05/31/2017 0931   K 3.5 06/05/2019 1005   K 3.5 05/31/2017 0931   CL 97 (L) 06/05/2019 1005   CL 99 12/20/2012 1336   CO2 30 06/05/2019 1005   CO2 25 05/31/2017 0931   BUN 12 06/05/2019 1005   BUN 10.9 05/31/2017 0931   CREATININE 0.66 06/05/2019 1005   CREATININE 0.63 07/05/2018 1009   CREATININE 0.8 05/31/2017 0931      Component Value Date/Time   CALCIUM 9.5 06/05/2019 1005   CALCIUM 10.6 (H) 05/31/2017 0931   ALKPHOS 200 (H) 06/05/2019 1005   ALKPHOS 91 05/31/2017 0931   AST 22 06/05/2019 1005   AST 16 05/31/2017 0931   ALT 18 06/05/2019 1005   ALT 15 05/31/2017 0931   BILITOT 0.4 06/05/2019 1005   BILITOT 0.33 05/31/2017 0931       RADIOGRAPHIC STUDIES: I have reviewed multiple imaging studies with the patient I have personally reviewed the radiological images as listed and agreed with the findings in the report. Ct Head Wo Contrast  Result Date: 05/22/2019 CLINICAL DATA:  78 year old female with increased weakness and fever. 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  EXAM: CT HEAD WITHOUT CONTRAST TECHNIQUE: Contiguous axial images were obtained from the base of the skull through  the vertex without intravenous contrast. COMPARISON:  Head and cervical spine CT 05/01/2019 and earlier. FINDINGS: Brain: Stable cerebral volume. Stable gray-white matter differentiation throughout the brain. No midline shift, ventriculomegaly, mass effect, evidence of mass lesion, intracranial hemorrhage or evidence of cortically based acute infarction. No cortical encephalomalacia. Vascular: Calcified atherosclerosis at the skull base. No suspicious intracranial vascular hyperdensity. Skull: Stable. No acute or suspicious osseous lesion. Sinuses/Orbits: Visualized paranasal sinuses and mastoids are stable and well pneumatized. Other: Visualized orbits and scalp soft tissues are within normal limits. IMPRESSION: Stable and negative for age noncontrast CT appearance of the brain. Note that early metastatic disease to the brain cannot be excluded in the absence of intravenous contrast. Electronically Signed   By: Genevie Ann M.D.   On: 05/22/2019 23:32   Ct Abdomen Pelvis W Contrast  Result Date: 05/26/2019 CLINICAL DATA:  Neutropenia, fever and weakness. Metastatic endometrial cancer. EXAM: CT ABDOMEN AND PELVIS WITH CONTRAST TECHNIQUE: Multidetector CT imaging of the abdomen and pelvis was performed using the standard protocol following bolus administration of intravenous contrast. CONTRAST:  170m OMNIPAQUE IOHEXOL 300 MG/ML  SOLN COMPARISON:  CT scan 04/13/2019 FINDINGS: Lower chest: Small right pleural effusion with overlying atelectasis, new since the prior study. Minimal left basilar atelectasis. The heart is normal in size. No pericardial effusion. Hepatobiliary: Enlarging necrotic appearing liver lesion occupying most of the upper right hepatic lobe and part of the left lobe. It measures 13.5 x 13.0 cm and previously measured 13.0 x 9.8 cm. A perfusion abnormality is noted around the lesion.  Moderate mass effect on the intrahepatic IVC. The gallbladder is contracted.  No common bile duct dilatation. Pancreas: No mass, inflammation or ductal dilatation. Spleen: Normal size. No focal lesions. Small accessory spleens are noted. Adrenals/Urinary Tract: Mild nodularity of the left adrenal gland is stable. Left renal cyst is stable. No worrisome renal lesions or hydronephrosis. The bladder is mildly distended but no bladder mass or calculi. Stomach/Bowel: The stomach, duodenum, small bowel and colon are grossly normal without oral contrast. No acute inflammatory changes, mass lesions or obstructive findings. The terminal ileum is normal. The appendix is normal. Vascular/Lymphatic: Stable advanced atherosclerotic calcifications involving the aorta iliac arteries but no aneurysm or dissection. The branch vessels are patent. No mesenteric or retroperitoneal lymphadenopathy. No omental disease is identified. Reproductive: Surgically absent. Other: No pelvic mass or pelvic lymphadenopathy. There is a small amount of fluid and edema in the presacral space. No inguinal mass or inguinal adenopathy. Musculoskeletal: Stable pathologic fracture involving the left iliac bone which is destroyed. Stable associated large soft tissue mass involving the iliac side and gluteus muscles. The left SI joint is mildly widened and the pubic symphysis again demonstrates diastasis. IMPRESSION: 1. Enlarging large necrotic hepatic mass. 2. 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 3. New small right pleural effusion and bibasilar atelectasis. Electronically Signed   By: PMarijo SanesM.D.   On: 05/26/2019 08:44   Dg Chest Port 1 View  Result Date: 05/22/2019 CLINICAL DATA:  78y/o  F; sepsis. EXAM: PORTABLE CHEST 1 VIEW COMPARISON:  12/05/2018 CT of the chest. FINDINGS: Normal cardiac silhouette. Aortic atherosclerosis with  calcification. Right port catheter tip projects over lower SVC. Streaky opacity at the right lung base. No pleural effusion or pneumothorax. No acute osseous abnormality is evident. IMPRESSION: Streaky opacity at the right lung base may represent atelectasis or pneumonia. Electronically Signed  By: Kristine Garbe M.D.   On: 05/22/2019 20:45    All questions were answered. The patient knows to call the clinic with any problems, questions or concerns. No barriers to learning was detected.  I spent 30 minutes counseling the patient face to face. The total time spent in the appointment was 40 minutes and more than 50% was on counseling and review of test results  [name], MD 06/05/2019 1:31 PM

## 2019-06-05 NOTE — Assessment & Plan Note (Signed)
She has bilateral leg leg edema due to fluid retention from steroids, reduced mobility and low protein status I plan to reduce the dose of dexamethasone further We encourage ambulation as tolerated I do not recommend regular diuretic therapy due to risk of dehydration

## 2019-06-05 NOTE — Assessment & Plan Note (Signed)
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

## 2019-06-06 ENCOUNTER — Telehealth: Payer: Self-pay | Admitting: Hematology

## 2019-06-06 NOTE — Telephone Encounter (Signed)
Scheduled appt per 7/13 sch message - unable to reach pts. Son - left message with appt date and time

## 2019-06-07 ENCOUNTER — Other Ambulatory Visit: Payer: Self-pay | Admitting: Hematology and Oncology

## 2019-06-07 ENCOUNTER — Other Ambulatory Visit: Payer: Self-pay | Admitting: Internal Medicine

## 2019-06-07 DIAGNOSIS — M5442 Lumbago with sciatica, left side: Secondary | ICD-10-CM

## 2019-06-08 ENCOUNTER — Other Ambulatory Visit: Payer: Self-pay | Admitting: Hematology and Oncology

## 2019-06-09 ENCOUNTER — Other Ambulatory Visit: Payer: Self-pay | Admitting: Hematology and Oncology

## 2019-06-09 ENCOUNTER — Inpatient Hospital Stay: Payer: Medicare Other

## 2019-06-09 ENCOUNTER — Other Ambulatory Visit: Payer: Self-pay

## 2019-06-09 ENCOUNTER — Inpatient Hospital Stay (HOSPITAL_BASED_OUTPATIENT_CLINIC_OR_DEPARTMENT_OTHER): Payer: Medicare Other | Admitting: Hematology and Oncology

## 2019-06-09 ENCOUNTER — Encounter: Payer: Self-pay | Admitting: Hematology and Oncology

## 2019-06-09 ENCOUNTER — Telehealth: Payer: Self-pay | Admitting: Oncology

## 2019-06-09 VITALS — BP 117/66 | HR 118 | Temp 97.4°F | Resp 18 | Ht 62.0 in | Wt 143.2 lb

## 2019-06-09 DIAGNOSIS — C7801 Secondary malignant neoplasm of right lung: Secondary | ICD-10-CM

## 2019-06-09 DIAGNOSIS — D6481 Anemia due to antineoplastic chemotherapy: Secondary | ICD-10-CM | POA: Diagnosis not present

## 2019-06-09 DIAGNOSIS — C7951 Secondary malignant neoplasm of bone: Secondary | ICD-10-CM

## 2019-06-09 DIAGNOSIS — C541 Malignant neoplasm of endometrium: Secondary | ICD-10-CM | POA: Diagnosis not present

## 2019-06-09 DIAGNOSIS — C787 Secondary malignant neoplasm of liver and intrahepatic bile duct: Secondary | ICD-10-CM

## 2019-06-09 DIAGNOSIS — R600 Localized edema: Secondary | ICD-10-CM

## 2019-06-09 DIAGNOSIS — Z7189 Other specified counseling: Secondary | ICD-10-CM

## 2019-06-09 DIAGNOSIS — D703 Neutropenia due to infection: Secondary | ICD-10-CM

## 2019-06-09 DIAGNOSIS — Z5111 Encounter for antineoplastic chemotherapy: Secondary | ICD-10-CM | POA: Diagnosis not present

## 2019-06-09 DIAGNOSIS — R64 Cachexia: Secondary | ICD-10-CM

## 2019-06-09 DIAGNOSIS — T451X5A Adverse effect of antineoplastic and immunosuppressive drugs, initial encounter: Secondary | ICD-10-CM

## 2019-06-09 LAB — CBC WITH DIFFERENTIAL (CANCER CENTER ONLY)
Abs Immature Granulocytes: 0.03 10*3/uL (ref 0.00–0.07)
Basophils Absolute: 0 10*3/uL (ref 0.0–0.1)
Basophils Relative: 1 %
Eosinophils Absolute: 0 10*3/uL (ref 0.0–0.5)
Eosinophils Relative: 1 %
HCT: 31.2 % — ABNORMAL LOW (ref 36.0–46.0)
Hemoglobin: 9.9 g/dL — ABNORMAL LOW (ref 12.0–15.0)
Immature Granulocytes: 1 %
Lymphocytes Relative: 7 %
Lymphs Abs: 0.5 10*3/uL — ABNORMAL LOW (ref 0.7–4.0)
MCH: 25.1 pg — ABNORMAL LOW (ref 26.0–34.0)
MCHC: 31.7 g/dL (ref 30.0–36.0)
MCV: 79.2 fL — ABNORMAL LOW (ref 80.0–100.0)
Monocytes Absolute: 0.3 10*3/uL (ref 0.1–1.0)
Monocytes Relative: 4 %
Neutro Abs: 5.7 10*3/uL (ref 1.7–7.7)
Neutrophils Relative %: 86 %
Platelet Count: 404 10*3/uL — ABNORMAL HIGH (ref 150–400)
RBC: 3.94 MIL/uL (ref 3.87–5.11)
RDW: 22.2 % — ABNORMAL HIGH (ref 11.5–15.5)
WBC Count: 6.5 10*3/uL (ref 4.0–10.5)
nRBC: 0 % (ref 0.0–0.2)

## 2019-06-09 LAB — CMP (CANCER CENTER ONLY)
ALT: 17 U/L (ref 0–44)
AST: 26 U/L (ref 15–41)
Albumin: 2.7 g/dL — ABNORMAL LOW (ref 3.5–5.0)
Alkaline Phosphatase: 168 U/L — ABNORMAL HIGH (ref 38–126)
Anion gap: 12 (ref 5–15)
BUN: 13 mg/dL (ref 8–23)
CO2: 25 mmol/L (ref 22–32)
Calcium: 8.7 mg/dL — ABNORMAL LOW (ref 8.9–10.3)
Chloride: 97 mmol/L — ABNORMAL LOW (ref 98–111)
Creatinine: 0.66 mg/dL (ref 0.44–1.00)
GFR, Est AFR Am: >60 mL/min (ref >60)
GFR, Estimated: >60 mL/min (ref >60)
Glucose, Bld: 202 mg/dL — ABNORMAL HIGH (ref 70–99)
Potassium: 3.8 mmol/L (ref 3.5–5.1)
Sodium: 134 mmol/L — ABNORMAL LOW (ref 135–145)
Total Bilirubin: 0.8 mg/dL (ref 0.3–1.2)
Total Protein: 6 g/dL — ABNORMAL LOW (ref 6.5–8.1)

## 2019-06-09 LAB — SAMPLE TO BLOOD BANK

## 2019-06-09 MED ORDER — FILGRASTIM-SNDZ 300 MCG/0.5ML IJ SOSY
300.0000 ug | PREFILLED_SYRINGE | Freq: Once | INTRAMUSCULAR | Status: AC
  Administered 2019-06-09: 300 ug via SUBCUTANEOUS
  Filled 2019-06-09: qty 0.5

## 2019-06-09 NOTE — Assessment & Plan Note (Signed)
She tolerated recent chemotherapy well Due to history of recurrent neutropenic sepsis, I recommend G-CSF support today and she agreed I will see her again next week for blood count monitoring and further supportive care

## 2019-06-09 NOTE — Assessment & Plan Note (Signed)
She has persistent anemia but not symptomatic We will monitor her blood counts carefully She does not need transfusion support today

## 2019-06-09 NOTE — Assessment & Plan Note (Signed)
Her appetite is stable and she is gaining weight I encouraged her to increase oral intake as tolerated

## 2019-06-09 NOTE — Assessment & Plan Note (Addendum)
She continues to have bilateral lower extremity edema We discussed omission of diuretic therapy I encourage ambulation, elastic compression hose and high-protein intake I am hopeful we can get her off dexamethasone in the near future

## 2019-06-09 NOTE — Progress Notes (Signed)
[place] OFFICE PROGRESS NOTE  Patient Care Team: [name], MD as PCP - General (Internal Medicine) [name], MD as Consulting Physician (Gastroenterology) [name], MD as Consulting Physician (Ophthalmology) [name], [name], MD as Consulting Physician (Obstetrics and Gynecology) [name], MD as Consulting Physician (Hematology and Oncology) [name], MD as Consulting Physician (Radiation Oncology)  ASSESSMENT & PLAN:  Endometrial cancer [facility]) She tolerated recent chemotherapy well Due to history of recurrent neutropenic sepsis, I recommend G-CSF support today and she agreed I will see her again next week for blood count monitoring and further supportive care  Anemia due to antineoplastic chemotherapy She has persistent anemia but not symptomatic We will monitor her blood counts carefully She does not need transfusion support today  Fluid retention in legs She continues to have bilateral lower extremity edema We discussed omission of diuretic therapy I encourage ambulation, elastic compression hose and high-protein intake I am hopeful we can get her off dexamethasone in the near future  Malignant cachexia ([place]) Her appetite is stable and she is gaining weight I encouraged her to increase oral intake as tolerated   No orders of the defined types were placed in this encounter.   INTERVAL HISTORY: Please see below for problem oriented charting. 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 SEE COMMENT.  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 The greatest extent of invasive carcinoma as measured on the needle core biopsy measures 0.6 cm.     Endometrial cancer ([place])  02/03/2002 Pathology Results   1. BENIGN ENDOMETRIAL POLYPS.  2. UTERINE FIBROIDS: PORTIONS OF SMOOTH MUSCLE CONSISTENT WITH BENIGN LEIOMYOMA(S). PORTIONS OF BENIGN, CYSTICALLY ATROPHIC ENDOMETRIUM WITHOUT HYPERPLASIA OR EVIDENCE OF MALIGNANCY. 3. ENDOMETRIAL CURETTAGE: BENIGN ENDOMETRIUM AND SMOOTH MUSCLE.   09/07/2016 Pathology Results   PAP smear positive for malignant cells   09/07/2016 Initial Diagnosis   Patient had postmenopausal bleeding in 08-2016. She had PAP 09-07-16 by PCP Dr [name], which documented carcinoma. She had evaluation by Dr [name] with colposcopy/ ECC/endometrial biopsy documenting serous endometrioid endometrial carcinoma.   09/15/2016 Imaging   Ct imaging showed markedly thickened and heterogeneous endometrial stripe suspicious for endometrial carcinoma in this patient with postmenopausal vaginal bleeding. Cystic lesion in the right ovary cannot be definitively characterized. Pelvic ultrasound is recommended for further evaluation. Aortoiliac atherosclerosis. Avascular necrosis of the femoral heads bilaterally.   10/07/2016 Tumor Marker   Patient's tumor was tested for the following markers: CA125 Results of the tumor marker test revealed 15.1   10/13/2016 Pathology Results   1. Lymph node, sentinel, biopsy, right obturator - THERE IS NO EVIDENCE OF CARCINOMA IN 8 OF 8 LYMPH  NODES (0/8). - SEE COMMENT. 2. Lymph node, sentinel, biopsy, left obturator -  THERE IS NO EVIDENCE OF CARCINOMA IN 1 OF 1 LYMPH NODE (0/1). - SEE COMMENT. 3. Lymph node, sentinel, biopsy, left para-aortic - THERE IS NO EVIDENCE OF CARCINOMA IN 4 OF 4 LYMPH NODES (0/4). - SEE COMMENT. 4. Uterus +/- tubes/ovaries, neoplastic, cervix - INVASIVE MIXED SEROUS/ENDOMETRIOID ADENOCARCINOMA, MULTIPLE FOCI, FIGO GRADE III, THE LARGEST FOCUS SPANS 8.2 CM. - ADENOCARCINOMA EXTENDS INTO THE OUTER HALF OF THE MYOMETRIUM AND INVOLVES THE STROMA OF THE LOWER UTERINE SEGMENT AND CERVIX. - LYMPHOVASCULAR INVASION IS IDENTIFIED. - THE SURGICAL RESECTION MARGINS ARE NEGATIVE FOR CARCINOMA. - SEE ONCOLOGY TABLE BELOW. ADDITIONAL FINDINGS: - ENDOMETRIUM: ENDOMETRIOID TYPE POLYP(S), WHICH CONTAIN FOCI OF SIMILAR APPEARING MIXED SEROUS/ENDOMETRIOID ADENOCARCINOMA. - MYOMETRIUM: LEIOMYOMATA. - SEROSA: UNREMARKABLE. - BILATERAL ADNEXA: BENIGN OVARIES AND FALLOPIAN TUBES.   10/13/2016 Surgery   Surgery: Total robotic hysterectomy bilateral salpingo-oophorectomy, bilateral pelvic sentinel lymph node removal, left PA sentinel lymph node removal. Mini-laparotomy for specimen removal Surgeons:  [name], MD; [name], MD  Pathology:  1)Uterus, cervix, bilateral tubes and ovaries 2) Right obturator SLN 3) Left Obturator SLN 4) Left PA SLN Operative findings: 14 week fibroid uterus with one large dominant 6 cm myoma that was palpably calcified. 4 cm right ovarian cyst. + SLN identified in the bilateral obturator spaces, + SLN identified in the left PA space.    12/01/2016 - 05/31/2017 Chemotherapy   She received carboplatin & Taxol. She had 3 cycles of chemo followed by radiation and then 3 more cycles of chemo   01/11/2017 Genetic Testing   Testing was normal and did not reveal a mutation in these genes: The genes tested were the 80 genes on [employer]'s Multi-Cancer panel (ALK, APC, ATM, AXIN2,  BAP1, BARD1, BLM, BMPR1A, BRCA1, BRCA2, BRIP1, CASR, CDC73, CDH1, CDK4, CDKN1B, CDKN1C, CDKN2A, CEBPA, CHEK2, DICER1, DIS3L2, EGFR, EPCAM, FH, FLCN, GATA2, GPC3, GREM1, HOXB13,  HRAS, KIT, MAX, MEN1, MET, MITF, MLH1, MSH2, MSH6, MUTYH, NBN, NF1, NF2, PALB2, PDGFRA, PHOX2B, PMS2, POLD1, POLE, POT1, PRKAR1A, PTCH1, PTEN, RAD50, RAD51C, RAD51D, RB1, RECQL4, RET, RUNX1, SDHA, SDHAF2, SDHB, SDHC, SDHD, SMAD4, SMARCA4, SMARCB1, SMARCE1, STK11, SUFU, TERC, TERT, TMEM127, TP53, TSC1, TSC2, VHL, WRN, and WT1).   02/10/2017 - 04/12/2017 Radiation Therapy   02/10/17-03/16/17; 03/29/17-04/12/17;  1) Pelvis/ 45 Gy in 25 fractions  2) Vaginal Cuff/ 18 Gy in 3 fractions   08/26/2018 Imaging   Large destructive mass lesion left iliac bone with large soft tissue mass extending into the posterior soft tissues and pelvis compatible with metastatic disease. Consider follow-up CT chest abdomen pelvis with contrast for further staging.  Radiation changes at L5 and the sacrum. No lumbar metastatic deposits  Multilevel degenerative changes above.   08/29/2018 Imaging   1. 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 2. New irregular nodular opacity in the medial right middle lobe, new since 2017 CT. Separate subcentimeter right upper lobe solid pulmonary nodule, for which no comparison exists. These findings are indeterminate for pulmonary metastases and attention on follow-up chest CT in 3 months is recommended. 3. No lymphadenopathy or additional findings of metastatic disease.No tumor recurrence at the hysterectomy margin. 4. Aortic Atherosclerosis (ICD10-I70.0). Additional chronic findings as detailed.   08/29/2018 Tumor Marker   Patient's tumor was tested for the following markers: CA-125 Results of the tumor marker test revealed 17.4   09/06/2018 Procedure   CT-guided core biopsy performed of huge mass in the left iliac fossa destroying the left iliac bone.   09/06/2018  Pathology Results  Soft Tissue Needle Core Biopsy, Left Iliac Fossa - POORLY DIFFERENTIATED MALIGNANT NEOPLASM. Microscopic Comment The provided clinical history of both endometrial and breast cancer is noted. In the current case, immunohistochemistry for qualitative ER demonstrates scattered positive staining. CK8/18 is focally positive. CKAE1/3, CK7, CK20, TTF-1, CDX-2, GATA3 and PAX 8 are negative. The immunophenotype is not specific as to an origin for this poorly differentiated malignant neoplasm. The morphology is more suggestive of endometrial cancer than breast cancer, and may represent an unrecognized carcinosarcomatous component.   09/08/2018 - 09/27/2018 Radiation Therapy   She had palliative radiation treatment   09/29/2018 Procedure   Placement of single lumen port a cath via right internal jugular vein. The catheter tip lies at the cavo-atrial junction. A power injectable port a cath was placed and is ready for immediate use.   10/04/2018 - 01/17/2019 Chemotherapy   The patient had carboplatin and taxol x 6 cycles   12/05/2018 Tumor Marker   Patient's tumor was tested for the following markers: CA125 Results of the tumor marker test revealed 5.5   12/05/2018 Imaging   1. Considerable cystic degeneration in the large destructive mass of the left iliac bone. New or progressive left iliac bone pathologic fracture extending into the left SI joint, along with abnormal subluxation at the pubic symphysis indicating pubic symphysis instability. 2. Previous pleural-based nodularity in the right middle lobe is markedly improved, currently 4 mm in thickness and previously 11 mm in thickness. 3. Indistinct new wedge-shaped lesion in the left mid kidney, hypodense on portal venous phase images and hyperdense on delayed phase images, suggesting prolong tubular transit. This is a nonspecific finding but could reflect local inflammation, infection, or less likely infiltrative tumor. 4. Other  imaging findings of potential clinical significance: Aortic Atherosclerosis (ICD10-I70.0). Left main coronary artery atherosclerosis. 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   02/15/2019 Imaging   1. Slight progression of large expansile lytic lesion involving the left iliac bone. This demonstrates central low density, suggesting partial necrosis. This lesion was biopsied on 09/06/2018, revealing poorly differentiated malignant neoplasm. 2. New low-density lesion in the dome of the right hepatic lobe worrisome for cystic or treated metastasis. 3. No other evidence of metastatic disease. No explanation for the patient's symptoms. 4. 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    03/03/2019 - 04/07/2019 Anti-estrogen oral therapy   She cannot tolerate tamoxifen and megace   03/23/2019 Tumor Marker   Patient's tumor was tested for the following markers: CA125 Results of the tumor marker test revealed 3.8   04/13/2019 Imaging   1. 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 2. Mild decrease in size of large lytic bone metastasis and associated soft tissue mass involving the left ilium. 3. Unstable pathologic fracture of the left ilium, without significant change since prior exam. 4. New right lower lobe pulmonary metastases.     04/24/2019 -  Chemotherapy   The patient had DOCEtaxel (TAXOTERE) 130 mg in sodium chloride 0.9 % 250 mL chemo infusion, 75 mg/m2 = 130 mg, Intravenous,  Once, 3 of 4 cycles Dose modification: 50 mg/m2 (66.7 % of original dose 75 mg/m2, Cycle 2, Reason: Dose Not Tolerated), 45 mg/m2 (60 % of original dose 75 mg/m2, Cycle 2, Reason: Dose Not Tolerated), 25 mg/m2 (33.3 % of original dose 75 mg/m2, Cycle 3, Reason: Dose Not Tolerated) Administration: 130 mg (04/24/2019), 80 mg (  05/15/2019), 40 mg (06/05/2019)  for  chemotherapy treatment.    05/01/2019 - 05/03/2019 Hospital Admission   She was admitted to the hospital for evaluation of fever and weakness   05/22/2019 - 05/26/2019 Hospital Admission   She was admitted to the hospital with recurrent neutropenic fever with no source found   05/26/2019 Imaging   1. Enlarging large necrotic hepatic mass. 2. 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 3. 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([place])  02/16/2019 Initial Diagnosis   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([place])   04/24/2019 -  Chemotherapy   The patient had DOCEtaxel (TAXOTERE) 130 mg in sodium chloride 0.9 % 250 mL chemo infusion, 75 mg/m2 = 130 mg, Intravenous,  Once, 3 of 4 cycles Dose modification: 50 mg/m2 (66.7 % of original dose 75 mg/m2, Cycle 2, Reason: Dose Not Tolerated), 45 mg/m2 (60 % of original dose 75 mg/m2, Cycle 2, Reason: Dose Not Tolerated), 25 mg/m2 (33.3 % of original dose 75 mg/m2, Cycle 3, Reason: Dose Not Tolerated) Administration: 130 mg (04/24/2019), 80 mg (05/15/2019), 40 mg (06/05/2019)  for chemotherapy treatment.      REVIEW OF SYSTEMS:   Constitutional: Denies fevers, chills or abnormal weight loss Eyes: Denies blurriness of vision Ears, nose, mouth, throat, and face: Denies mucositis or sore  throat Respiratory: Denies cough, dyspnea or wheezes Cardiovascular: Denies palpitation, chest discomfort  Gastrointestinal:  Denies nausea, heartburn or change in bowel habits Skin: Denies abnormal skin rashes Lymphatics: Denies new lymphadenopathy or easy bruising Neurological:Denies numbness, tingling or new weaknesses Behavioral/Psych: Mood is stable, no new changes  All other systems were reviewed with the patient and are negative.  I have reviewed the past medical history, past surgical history, social history and family history with the patient and they are unchanged from previous note.  ALLERGIES:  is allergic to omnipaque [iohexol]; calan [verapamil]; effexor [venlafaxine]; erythromycin; femara [letrozole]; fosamax [alendronate]; ibuprofen; paxil [paroxetine hcl]; remeron [mirtazapine]; and vasotec [enalapril].  MEDICATIONS:  Current Outpatient Medications  Medication Sig Dispense Refill  . Calcium Carbonate-Vitamin D (CALCIUM 600 + D PO) Take 1 tablet by mouth 2 (two) times daily.     . Cholecalciferol (VITAMIN D) 2000 units CAPS Take 2,000 Units by mouth every other day.    Marland Kitchen dexamethasone (DECADRON) 4 MG tablet Take 2 mg on Mondays and Thursdays only 60 tablet 0  . furosemide (LASIX) 20 MG tablet Take 1 tablet (20 mg total) by mouth daily as needed for fluid or edema. 30 tablet 1  . HYDROcodone-acetaminophen (NORCO/VICODIN) 5-325 MG tablet Take 1-2 tablets by mouth every 6 (six) hours as needed for severe pain. 30 tablet 0  . levothyroxine (SYNTHROID) 88 MCG tablet Take 1 tablet daily on an empty stomach with only water for 30 minutes &  no Antacid meds, Calcium or Magnesium for 4 hours & avoid Biotin 90 tablet 3  . metFORMIN (GLUCOPHAGE-XR) 500 MG 24 hr tablet Take 1 tablet (500 mg total) by mouth 2 (two) times daily. 360 tablet 1  . Multiple Vitamin (MULTIVITAMIN WITH MINERALS) TABS tablet Take 1 tablet by mouth daily.    . pantoprazole (PROTONIX) 40 MG tablet Take 1 tablet (40  mg total) by mouth 2 (two) times daily. 60 tablet 0  . polyethylene glycol (MIRALAX / GLYCOLAX) 17 g packet Take 17 g by mouth daily as needed for moderate constipation. 30 each 0  . pravastatin (PRAVACHOL) 40 MG tablet TAKE 1 TABLET BY MOUTH ONCE DAILY IN THE EVENING 90 tablet 0  . prochlorperazine (COMPAZINE) 10 MG tablet Take 1 tablet (10 mg total) by mouth every 6 (six) hours as needed for nausea or vomiting. 30 tablet 1  . traMADol (ULTRAM) 50 MG tablet Take 1 tablet (50 mg total) by mouth every 6 (six) hours as needed for moderate pain. 90 tablet 0   No current facility-administered medications for this visit.    Facility-Administered Medications Ordered in Other Visits  Medication Dose Route Frequency Provider Last Rate Last Dose  . sodium chloride flush (NS) 0.9 % injection 10 mL  10 mL Intracatheter PRN [name], MD   10 mL at 12/27/18 1825    PHYSICAL EXAMINATION: ECOG PERFORMANCE STATUS: 2 - Symptomatic, <50% confined to bed  Vitals:   06/09/19 1037  BP: 117/66  Pulse: (!) 118  Resp: 18  Temp: (!) 97.4 F (36.3 C)  SpO2: 99%   Filed Weights   06/09/19 1037  Weight: 143 lb 3.2 oz (65 kg)    GENERAL:alert, no distress and comfortable SKIN: skin color, texture, turgor are normal, no rashes or significant lesions EYES: normal, Conjunctiva are pink and non-injected, sclera clear OROPHARYNX:no exudate, no erythema and lips, buccal mucosa, and tongue normal  NECK: supple, thyroid normal size, non-tender, without nodularity LYMPH:  no palpable lymphadenopathy in the cervical, axillary or inguinal LUNGS: clear to auscultation and percussion with normal breathing effort HEART: regular rate & rhythm and no murmurs with bilateral moderate lower extremity edema ABDOMEN:abdomen soft, non-tender and normal bowel sounds Musculoskeletal:no cyanosis of digits and no clubbing  NEURO: alert & oriented x 3 with fluent speech, no focal motor/sensory deficits  LABORATORY DATA:  I  have reviewed the data as listed    Component Value Date/Time   NA 134 (L) 06/09/2019 0947   NA 137 05/31/2017 0931   K 3.8 06/09/2019 0947   K 3.5 05/31/2017 0931   CL 97 (L) 06/09/2019 0947   CL 99 12/20/2012 1336   CO2 25 06/09/2019 0947   CO2 25 05/31/2017 0931   GLUCOSE 202 (H) 06/09/2019 0947   GLUCOSE 161 (H) 05/31/2017 0931   GLUCOSE 110 (H) 12/20/2012 1336   BUN 13 06/09/2019 0947   BUN 10.9 05/31/2017 0931   CREATININE 0.66 06/09/2019 0947   CREATININE 0.63 07/05/2018 1009   CREATININE 0.8 05/31/2017 0931   CALCIUM 8.7 (L) 06/09/2019 0947   CALCIUM 10.6 (H) 05/31/2017 0931   PROT 6.0 (L) 06/09/2019 0947   PROT 7.5 05/31/2017 0931   ALBUMIN 2.7 (L) 06/09/2019 0947   ALBUMIN 4.2 05/31/2017 0931   AST 26 06/09/2019 0947   AST 16 05/31/2017 0931   ALT 17 06/09/2019 0947   ALT 15 05/31/2017 0931   ALKPHOS 168 (H) 06/09/2019 0947   ALKPHOS 91 05/31/2017 0931   BILITOT  0.8 06/09/2019 0947   BILITOT 0.33 05/31/2017 0931   GFRNONAA >60 06/09/2019 0947   GFRNONAA 87 07/05/2018 1009   GFRAA >60 06/09/2019 0947   GFRAA 100 07/05/2018 1009    No results found for: SPEP, UPEP  Lab Results  Component Value Date   WBC 6.5 06/09/2019   NEUTROABS 5.7 06/09/2019   HGB 9.9 (L) 06/09/2019   HCT 31.2 (L) 06/09/2019   MCV 79.2 (L) 06/09/2019   PLT 404 (H) 06/09/2019      Chemistry      Component Value Date/Time   NA 134 (L) 06/09/2019 0947   NA 137 05/31/2017 0931   K 3.8 06/09/2019 0947   K 3.5 05/31/2017 0931   CL 97 (L) 06/09/2019 0947   CL 99 12/20/2012 1336   CO2 25 06/09/2019 0947   CO2 25 05/31/2017 0931   BUN 13 06/09/2019 0947   BUN 10.9 05/31/2017 0931   CREATININE 0.66 06/09/2019 0947   CREATININE 0.63 07/05/2018 1009   CREATININE 0.8 05/31/2017 0931      Component Value Date/Time   CALCIUM 8.7 (L) 06/09/2019 0947   CALCIUM 10.6 (H) 05/31/2017 0931   ALKPHOS 168 (H) 06/09/2019 0947   ALKPHOS 91 05/31/2017 0931   AST 26 06/09/2019 0947   AST 16  05/31/2017 0931   ALT 17 06/09/2019 0947   ALT 15 05/31/2017 0931   BILITOT 0.8 06/09/2019 0947   BILITOT 0.33 05/31/2017 0931       RADIOGRAPHIC STUDIES: I have personally reviewed the radiological images as listed and agreed with the findings in the report. Ct Head Wo Contrast  Result Date: 05/22/2019 CLINICAL DATA:  78 year old female with increased weakness and fever. 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 EXAM: CT HEAD WITHOUT CONTRAST TECHNIQUE: Contiguous axial images were obtained from the base of the skull through the vertex without intravenous contrast. COMPARISON:  Head and cervical spine CT 05/01/2019 and earlier. FINDINGS: Brain: Stable cerebral volume. Stable gray-white matter differentiation throughout the brain. No midline shift, ventriculomegaly, mass effect, evidence of mass lesion, intracranial hemorrhage or evidence of cortically based acute infarction. No cortical encephalomalacia. Vascular: Calcified atherosclerosis at the skull base. No suspicious intracranial vascular hyperdensity. Skull: Stable. No acute or suspicious osseous lesion. Sinuses/Orbits: Visualized paranasal sinuses and mastoids are stable and well pneumatized. Other: Visualized orbits and scalp soft tissues are within normal limits. IMPRESSION: Stable and negative for age noncontrast CT appearance of the brain. Note that early metastatic disease to the brain cannot be excluded in the absence of intravenous contrast. Electronically Signed   By: Genevie Ann M.D.   On: 05/22/2019 23:32   Ct Abdomen Pelvis W Contrast  Result Date: 05/26/2019 CLINICAL DATA:  Neutropenia, fever and weakness. Metastatic endometrial cancer. EXAM: CT ABDOMEN AND PELVIS WITH CONTRAST TECHNIQUE: Multidetector CT imaging of the abdomen and pelvis was performed using the standard protocol following bolus administration of intravenous contrast. CONTRAST:  154m OMNIPAQUE IOHEXOL 300 MG/ML  SOLN COMPARISON:  CT scan 04/13/2019  FINDINGS: Lower chest: Small right pleural effusion with overlying atelectasis, new since the prior study. Minimal left basilar atelectasis. The heart is normal in size. No pericardial effusion. Hepatobiliary: Enlarging necrotic appearing liver lesion occupying most of the upper right hepatic lobe and part of the left lobe. It measures 13.5 x 13.0 cm and previously measured 13.0 x 9.8 cm. A perfusion abnormality is noted around the lesion. Moderate mass effect on the intrahepatic IVC. The gallbladder is contracted.  No common bile  duct dilatation. Pancreas: No mass, inflammation or ductal dilatation. Spleen: Normal size. No focal lesions. Small accessory spleens are noted. Adrenals/Urinary Tract: Mild nodularity of the left adrenal gland is stable. Left renal cyst is stable. No worrisome renal lesions or hydronephrosis. The bladder is mildly distended but no bladder mass or calculi. Stomach/Bowel: The stomach, duodenum, small bowel and colon are grossly normal without oral contrast. No acute inflammatory changes, mass lesions or obstructive findings. The terminal ileum is normal. The appendix is normal. Vascular/Lymphatic: Stable advanced atherosclerotic calcifications involving the aorta iliac arteries but no aneurysm or dissection. The branch vessels are patent. No mesenteric or retroperitoneal lymphadenopathy. No omental disease is identified. Reproductive: Surgically absent. Other: No pelvic mass or pelvic lymphadenopathy. There is a small amount of fluid and edema in the presacral space. No inguinal mass or inguinal adenopathy. Musculoskeletal: Stable pathologic fracture involving the left iliac bone which is destroyed. Stable associated large soft tissue mass involving the iliac side and gluteus muscles. The left SI joint is mildly widened and the pubic symphysis again demonstrates diastasis. IMPRESSION: 1. Enlarging large necrotic hepatic mass. 2. Stable pathologic fracture of the left iliac bone and large  destructive tumor invading the left iliacus muscle and left gluteus medius muscle. 3. New small right pleural effusion and bibasilar atelectasis. Electronically Signed   By: Marijo Sanes M.D.   On: 05/26/2019 08:44   Dg Chest Port 1 View  Result Date: 05/22/2019 CLINICAL DATA:  78 y/o  F; sepsis. EXAM: PORTABLE CHEST 1 VIEW COMPARISON:  12/05/2018 CT of the chest. FINDINGS: Normal cardiac silhouette. Aortic atherosclerosis with calcification. Right port catheter tip projects over lower SVC. Streaky opacity at the right lung base. No pleural effusion or pneumothorax. No acute osseous abnormality is evident. IMPRESSION: Streaky opacity at the right lung base may represent atelectasis or pneumonia. Electronically Signed   By: Kristine Garbe M.D.   On: 05/22/2019 20:45    All questions were answered. The patient knows to call the clinic with any problems, questions or concerns. No barriers to learning was detected.  I spent 15 minutes counseling the patient face to face. The total time spent in the appointment was 20 minutes and more than 50% was on counseling and review of test results  [name], MD 06/09/2019 12:24 PM

## 2019-06-09 NOTE — Telephone Encounter (Signed)
Scheduled appt per 7/17 sch message- spoke with Son - he is aware of appt date and time

## 2019-06-14 ENCOUNTER — Inpatient Hospital Stay: Payer: Medicare Other

## 2019-06-14 ENCOUNTER — Other Ambulatory Visit: Payer: Self-pay

## 2019-06-14 ENCOUNTER — Telehealth: Payer: Self-pay

## 2019-06-14 ENCOUNTER — Telehealth: Payer: Self-pay | Admitting: Hematology and Oncology

## 2019-06-14 ENCOUNTER — Other Ambulatory Visit: Payer: Self-pay | Admitting: Hematology and Oncology

## 2019-06-14 ENCOUNTER — Inpatient Hospital Stay (HOSPITAL_BASED_OUTPATIENT_CLINIC_OR_DEPARTMENT_OTHER): Payer: Medicare Other | Admitting: Hematology and Oncology

## 2019-06-14 DIAGNOSIS — T451X5A Adverse effect of antineoplastic and immunosuppressive drugs, initial encounter: Secondary | ICD-10-CM

## 2019-06-14 DIAGNOSIS — Z7189 Other specified counseling: Secondary | ICD-10-CM

## 2019-06-14 DIAGNOSIS — R64 Cachexia: Secondary | ICD-10-CM | POA: Diagnosis not present

## 2019-06-14 DIAGNOSIS — R600 Localized edema: Secondary | ICD-10-CM

## 2019-06-14 DIAGNOSIS — C541 Malignant neoplasm of endometrium: Secondary | ICD-10-CM | POA: Diagnosis not present

## 2019-06-14 DIAGNOSIS — C787 Secondary malignant neoplasm of liver and intrahepatic bile duct: Secondary | ICD-10-CM

## 2019-06-14 DIAGNOSIS — Z5111 Encounter for antineoplastic chemotherapy: Secondary | ICD-10-CM | POA: Diagnosis not present

## 2019-06-14 DIAGNOSIS — D6481 Anemia due to antineoplastic chemotherapy: Secondary | ICD-10-CM | POA: Diagnosis not present

## 2019-06-14 DIAGNOSIS — C7951 Secondary malignant neoplasm of bone: Secondary | ICD-10-CM

## 2019-06-14 LAB — CBC WITH DIFFERENTIAL (CANCER CENTER ONLY)
Abs Immature Granulocytes: 0.06 10*3/uL (ref 0.00–0.07)
Basophils Absolute: 0 10*3/uL (ref 0.0–0.1)
Basophils Relative: 1 %
Eosinophils Absolute: 0 10*3/uL (ref 0.0–0.5)
Eosinophils Relative: 0 %
HCT: 31.3 % — ABNORMAL LOW (ref 36.0–46.0)
Hemoglobin: 10 g/dL — ABNORMAL LOW (ref 12.0–15.0)
Immature Granulocytes: 1 %
Lymphocytes Relative: 7 %
Lymphs Abs: 0.5 10*3/uL — ABNORMAL LOW (ref 0.7–4.0)
MCH: 25.5 pg — ABNORMAL LOW (ref 26.0–34.0)
MCHC: 31.9 g/dL (ref 30.0–36.0)
MCV: 79.8 fL — ABNORMAL LOW (ref 80.0–100.0)
Monocytes Absolute: 0.7 10*3/uL (ref 0.1–1.0)
Monocytes Relative: 12 %
Neutro Abs: 5 10*3/uL (ref 1.7–7.7)
Neutrophils Relative %: 79 %
Platelet Count: 436 10*3/uL — ABNORMAL HIGH (ref 150–400)
RBC: 3.92 MIL/uL (ref 3.87–5.11)
RDW: 22.1 % — ABNORMAL HIGH (ref 11.5–15.5)
WBC Count: 6.3 10*3/uL (ref 4.0–10.5)
nRBC: 0.3 % — ABNORMAL HIGH (ref 0.0–0.2)

## 2019-06-14 LAB — CMP (CANCER CENTER ONLY)
ALT: 18 U/L (ref 0–44)
AST: 25 U/L (ref 15–41)
Albumin: 2.9 g/dL — ABNORMAL LOW (ref 3.5–5.0)
Alkaline Phosphatase: 195 U/L — ABNORMAL HIGH (ref 38–126)
Anion gap: 12 (ref 5–15)
BUN: 8 mg/dL (ref 8–23)
CO2: 28 mmol/L (ref 22–32)
Calcium: 9.3 mg/dL (ref 8.9–10.3)
Chloride: 96 mmol/L — ABNORMAL LOW (ref 98–111)
Creatinine: 0.71 mg/dL (ref 0.44–1.00)
GFR, Est AFR Am: >60 mL/min (ref >60)
GFR, Estimated: >60 mL/min (ref >60)
Glucose, Bld: 132 mg/dL — ABNORMAL HIGH (ref 70–99)
Potassium: 3.4 mmol/L — ABNORMAL LOW (ref 3.5–5.1)
Sodium: 136 mmol/L (ref 135–145)
Total Bilirubin: 0.7 mg/dL (ref 0.3–1.2)
Total Protein: 6.4 g/dL — ABNORMAL LOW (ref 6.5–8.1)

## 2019-06-14 LAB — SAMPLE TO BLOOD BANK

## 2019-06-14 NOTE — Telephone Encounter (Signed)
Scheduled appt per 7/22 sch message - pt son aware of appt date and time

## 2019-06-14 NOTE — Telephone Encounter (Signed)
Called [facility] nurse back and told her Dr. Alvy Bimler saw the patient this morning. She verbalized understanding.

## 2019-06-14 NOTE — Telephone Encounter (Signed)
Nurse with [facility] called and left a message. She is having symptoms of CHF.  She has ankle edema and shortness of breath with rest/ exertion.  She has 1030 appt today.

## 2019-06-15 ENCOUNTER — Telehealth: Payer: Self-pay

## 2019-06-15 ENCOUNTER — Encounter: Payer: Self-pay | Admitting: Hematology and Oncology

## 2019-06-15 NOTE — Assessment & Plan Note (Signed)
She tolerated recent chemotherapy well Her blood counts are stable She does not need further G-CSF support She wants to continue weekly follow-up here to avoid admission which I think is reasonable I will continue to provide aggressive supportive care as needed

## 2019-06-15 NOTE — Telephone Encounter (Signed)
-----   Message from [name], MD sent at 06/15/2019  7:55 AM EDT ----- Regarding: electronic request for trazodone Is she still taking this?

## 2019-06-15 NOTE — Progress Notes (Signed)
[facility] OFFICE PROGRESS NOTE  Patient Care Team: [name], MD as PCP - General (Internal Medicine) [name], MD as Consulting Physician (Gastroenterology) [name], MD as Consulting Physician (Ophthalmology) [name], [name], MD as Consulting Physician (Obstetrics and Gynecology) [name], MD as Consulting Physician (Hematology and Oncology) [name], MD as Consulting Physician (Radiation Oncology)  ASSESSMENT & PLAN:  Endometrial cancer [facility]) She tolerated recent chemotherapy well Her blood counts are stable She does not need further G-CSF support She wants to continue weekly follow-up here to avoid admission which I think is reasonable I will continue to provide aggressive supportive care as needed  Anemia due to antineoplastic chemotherapy She has persistent anemia but not symptomatic We will monitor her blood counts carefully She does not need transfusion support today  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   INTERVAL HISTORY: Please see below for problem oriented charting. 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 SEE COMMENT.  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 The greatest extent of invasive carcinoma as measured on the needle core biopsy measures 0.6 cm.     Endometrial cancer ([place])  02/03/2002 Pathology Results   1. BENIGN ENDOMETRIAL POLYPS.  2. UTERINE FIBROIDS: PORTIONS OF SMOOTH MUSCLE CONSISTENT WITH BENIGN LEIOMYOMA(S). PORTIONS OF BENIGN, CYSTICALLY ATROPHIC ENDOMETRIUM WITHOUT HYPERPLASIA OR EVIDENCE OF MALIGNANCY. 3. ENDOMETRIAL CURETTAGE: BENIGN ENDOMETRIUM AND SMOOTH MUSCLE.   09/07/2016 Pathology Results   PAP smear positive for malignant cells   09/07/2016 Initial Diagnosis   Patient had postmenopausal bleeding in 08-2016. She had PAP 09-07-16 by PCP Dr [name], which documented carcinoma. She had evaluation by Dr [name] with colposcopy/ ECC/endometrial biopsy documenting serous endometrioid endometrial carcinoma.   09/15/2016 Imaging   Ct imaging showed markedly thickened and heterogeneous endometrial stripe suspicious for endometrial carcinoma in this patient with postmenopausal vaginal bleeding. Cystic lesion in the right ovary cannot be definitively characterized. Pelvic ultrasound is recommended for further evaluation. Aortoiliac atherosclerosis. Avascular necrosis of the femoral heads bilaterally.   10/07/2016 Tumor Marker   Patient's tumor was tested for the following markers: CA125 Results of the tumor marker test revealed 15.1   10/13/2016 Pathology Results   1. Lymph node, sentinel, biopsy, right obturator - THERE IS NO EVIDENCE OF CARCINOMA IN 8 OF 8 LYMPH NODES (0/8). - SEE COMMENT. 2. Lymph node,  sentinel, biopsy, left obturator - THERE IS NO EVIDENCE OF  CARCINOMA IN 1 OF 1 LYMPH NODE (0/1). - SEE COMMENT. 3. Lymph node, sentinel, biopsy, left para-aortic - THERE IS NO EVIDENCE OF CARCINOMA IN 4 OF 4 LYMPH NODES (0/4). - SEE COMMENT. 4. Uterus +/- tubes/ovaries, neoplastic, cervix - INVASIVE MIXED SEROUS/ENDOMETRIOID ADENOCARCINOMA, MULTIPLE FOCI, FIGO GRADE III, THE LARGEST FOCUS SPANS 8.2 CM. - ADENOCARCINOMA EXTENDS INTO THE OUTER HALF OF THE MYOMETRIUM AND INVOLVES THE STROMA OF THE LOWER UTERINE SEGMENT AND CERVIX. - LYMPHOVASCULAR INVASION IS IDENTIFIED. - THE SURGICAL RESECTION MARGINS ARE NEGATIVE FOR CARCINOMA. - SEE ONCOLOGY TABLE BELOW. ADDITIONAL FINDINGS: - ENDOMETRIUM: ENDOMETRIOID TYPE POLYP(S), WHICH CONTAIN FOCI OF SIMILAR APPEARING MIXED SEROUS/ENDOMETRIOID ADENOCARCINOMA. - MYOMETRIUM: LEIOMYOMATA. - SEROSA: UNREMARKABLE. - BILATERAL ADNEXA: BENIGN OVARIES AND FALLOPIAN TUBES.   10/13/2016 Surgery   Surgery: Total robotic hysterectomy bilateral salpingo-oophorectomy, bilateral pelvic sentinel lymph node removal, left PA sentinel lymph node removal. Mini-laparotomy for specimen removal Surgeons:  [name], MD; [name], MD  Pathology:  1)Uterus, cervix, bilateral tubes and ovaries 2) Right obturator SLN 3) Left Obturator SLN 4) Left PA SLN Operative findings: 14 week fibroid uterus with one large dominant 6 cm myoma that was palpably calcified. 4 cm right ovarian cyst. + SLN identified in the bilateral obturator spaces, + SLN identified in the left PA space.    12/01/2016 - 05/31/2017 Chemotherapy   She received carboplatin & Taxol. She had 3 cycles of chemo followed by radiation and then 3 more cycles of chemo   01/11/2017 Genetic Testing   Testing was normal and did not reveal a mutation in these genes: The genes tested were the 80 genes on [employer]'s Multi-Cancer panel (ALK, APC, ATM, AXIN2, BAP1, BARD1, BLM, BMPR1A, BRCA1, BRCA2, BRIP1,  CASR, CDC73, CDH1, CDK4, CDKN1B, CDKN1C, CDKN2A, CEBPA, CHEK2, DICER1, DIS3L2, EGFR, EPCAM, FH, FLCN, GATA2, GPC3, GREM1, HOXB13,  HRAS, KIT, MAX, MEN1, MET, MITF, MLH1, MSH2, MSH6, MUTYH, NBN, NF1, NF2, PALB2, PDGFRA, PHOX2B, PMS2, POLD1, POLE, POT1, PRKAR1A, PTCH1, PTEN, RAD50, RAD51C, RAD51D, RB1, RECQL4, RET, RUNX1, SDHA, SDHAF2, SDHB, SDHC, SDHD, SMAD4, SMARCA4, SMARCB1, SMARCE1, STK11, SUFU, TERC, TERT, TMEM127, TP53, TSC1, TSC2, VHL, WRN, and WT1).   02/10/2017 - 04/12/2017 Radiation Therapy   02/10/17-03/16/17; 03/29/17-04/12/17;  1) Pelvis/ 45 Gy in 25 fractions  2) Vaginal Cuff/ 18 Gy in 3 fractions   08/26/2018 Imaging   Large destructive mass lesion left iliac bone with large soft tissue mass extending into the posterior soft tissues and pelvis compatible with metastatic disease. Consider follow-up CT chest abdomen pelvis with contrast for further staging.  Radiation changes at L5 and the sacrum. No lumbar metastatic deposits  Multilevel degenerative changes above.   08/29/2018 Imaging   1. 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 2. New irregular nodular opacity in the medial right middle lobe, new since 2017 CT. Separate subcentimeter right upper lobe solid pulmonary nodule, for which no comparison exists. These findings are indeterminate for pulmonary metastases and attention on follow-up chest CT in 3 months is recommended. 3. No lymphadenopathy or additional findings of metastatic disease.No tumor recurrence at the hysterectomy margin. 4. Aortic Atherosclerosis (ICD10-I70.0). Additional chronic findings as detailed.   08/29/2018 Tumor Marker   Patient's tumor was tested for the following markers: CA-125 Results of the tumor marker test revealed 17.4   09/06/2018 Procedure   CT-guided core biopsy performed of huge mass in the left iliac fossa destroying the left iliac bone.   09/06/2018 Pathology Results   Soft Tissue Needle Core  Biopsy, Left Iliac Fossa - POORLY DIFFERENTIATED MALIGNANT NEOPLASM. Microscopic Comment The provided clinical history of both endometrial and breast cancer is noted. In the current case, immunohistochemistry for qualitative ER demonstrates scattered positive staining. CK8/18 is focally positive. CKAE1/3, CK7, CK20, TTF-1, CDX-2, GATA3 and PAX 8 are negative. The immunophenotype is not specific as to an origin for this poorly differentiated malignant neoplasm. The morphology is more suggestive of endometrial cancer than breast cancer, and may represent an unrecognized carcinosarcomatous component.   09/08/2018 - 09/27/2018 Radiation Therapy   She had palliative radiation treatment   09/29/2018 Procedure   Placement of single lumen port a cath via right internal jugular vein. The catheter tip lies at the cavo-atrial junction. A power injectable port a cath was placed and is ready for immediate use.   10/04/2018 - 01/17/2019 Chemotherapy   The patient had carboplatin and taxol x 6 cycles   12/05/2018 Tumor Marker   Patient's tumor was tested for the following markers: CA125 Results of the tumor marker test revealed 5.5   12/05/2018 Imaging   1. Considerable cystic degeneration in the large destructive mass of the left iliac bone. New or progressive left iliac bone pathologic fracture extending into the left SI joint, along with abnormal subluxation at the pubic symphysis indicating pubic symphysis instability. 2. Previous pleural-based nodularity in the right middle lobe is markedly improved, currently 4 mm in thickness and previously 11 mm in thickness. 3. Indistinct new wedge-shaped lesion in the left mid kidney, hypodense on portal venous phase images and hyperdense on delayed phase images, suggesting prolong tubular transit. This is a nonspecific finding but could reflect local inflammation, infection, or less likely infiltrative tumor. 4. Other imaging findings of potential clinical  significance: Aortic Atherosclerosis (ICD10-I70.0). Left main coronary artery atherosclerosis. 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   02/15/2019 Imaging   1. Slight progression of large expansile lytic lesion involving the left iliac bone. This demonstrates central low density, suggesting partial necrosis. This lesion was biopsied on 09/06/2018, revealing poorly differentiated malignant neoplasm. 2. New low-density lesion in the dome of the right hepatic lobe worrisome for cystic or treated metastasis. 3. No other evidence of metastatic disease. No explanation for the patient's symptoms. 4. 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    03/03/2019 - 04/07/2019 Anti-estrogen oral therapy   She cannot tolerate tamoxifen and megace   03/23/2019 Tumor Marker   Patient's tumor was tested for the following markers: CA125 Results of the tumor marker test revealed 3.8   04/13/2019 Imaging   1. 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 2. Mild decrease in size of large lytic bone metastasis and associated soft tissue mass involving the left ilium. 3. Unstable pathologic fracture of the left ilium, without significant change since prior exam. 4. New right lower lobe pulmonary metastases.     04/24/2019 -  Chemotherapy   The patient had DOCEtaxel (TAXOTERE) 130 mg in sodium chloride 0.9 % 250 mL chemo infusion, 75 mg/m2 = 130 mg, Intravenous,  Once, 3 of 4 cycles Dose modification: 50 mg/m2 (66.7 % of original dose 75 mg/m2, Cycle 2, Reason: Dose Not Tolerated), 45 mg/m2 (60 % of original dose 75 mg/m2, Cycle 2, Reason: Dose Not Tolerated), 25 mg/m2 (33.3 % of original dose 75 mg/m2, Cycle 3, Reason: Dose Not Tolerated) Administration: 130 mg (04/24/2019), 80 mg (05/15/2019), 40 mg (06/05/2019)  for chemotherapy treatment.    05/01/2019 -  05/03/2019 Hospital Admission   She was admitted to the hospital for evaluation of fever and weakness   05/22/2019 - 05/26/2019 Hospital Admission   She was admitted to the hospital with recurrent neutropenic fever with no source found   05/26/2019 Imaging   1. Enlarging large necrotic hepatic mass. 2. 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 3. 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([place])  02/16/2019 Initial Diagnosis   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([employer])   04/24/2019 -  Chemotherapy   The patient had DOCEtaxel (TAXOTERE) 130 mg in sodium chloride 0.9 % 250 mL chemo infusion, 75 mg/m2 = 130 mg, Intravenous,  Once, 3 of 4 cycles Dose modification: 50 mg/m2 (66.7 % of original dose 75 mg/m2, Cycle 2, Reason: Dose Not Tolerated), 45 mg/m2 (60 % of original dose 75 mg/m2, Cycle 2, Reason: Dose Not Tolerated), 25 mg/m2 (33.3 % of original dose 75 mg/m2, Cycle 3, Reason: Dose Not Tolerated) Administration: 130 mg (04/24/2019), 80 mg (05/15/2019), 40 mg (06/05/2019)  for chemotherapy treatment.      REVIEW OF SYSTEMS:   Constitutional: Denies fevers, chills or abnormal weight loss Eyes: Denies blurriness of vision Ears, nose, mouth, throat, and face: Denies mucositis or sore throat Respiratory: Denies cough, dyspnea  or wheezes Cardiovascular: Denies palpitation, chest discomfort  Gastrointestinal:  Denies nausea, heartburn or change in bowel habits Skin: Denies abnormal skin rashes Lymphatics: Denies new lymphadenopathy or easy bruising Neurological:Denies numbness, tingling or new weaknesses Behavioral/Psych: Mood is stable, no new changes  All other systems were reviewed with the patient and are negative.  I have reviewed the past medical history, past surgical history, social history and family history with the patient and they are unchanged from previous note.  ALLERGIES:  is allergic to omnipaque [iohexol]; calan [verapamil]; effexor [venlafaxine]; erythromycin; femara [letrozole]; fosamax [alendronate]; ibuprofen; paxil [paroxetine hcl]; remeron [mirtazapine]; and vasotec [enalapril].  MEDICATIONS:  Current Outpatient Medications  Medication Sig Dispense Refill  . Calcium Carbonate-Vitamin D (CALCIUM 600 + D PO) Take 1 tablet by mouth 2 (two) times daily.     . Cholecalciferol (VITAMIN D) 2000 units CAPS Take 2,000 Units by mouth every other day.    Marland Kitchen dexamethasone (DECADRON) 4 MG tablet Take 2 mg on Mondays and Thursdays only 60 tablet 0  . furosemide (LASIX) 20 MG tablet Take 1 tablet (20 mg total) by mouth daily as needed for fluid or edema. 30 tablet 1  . HYDROcodone-acetaminophen (NORCO/VICODIN) 5-325 MG tablet Take 1-2 tablets by mouth every 6 (six) hours as needed for severe pain. 30 tablet 0  . levothyroxine (SYNTHROID) 88 MCG tablet Take 1 tablet daily on an empty stomach with only water for 30 minutes & no Antacid meds, Calcium or  Magnesium for 4 hours & avoid Biotin 90 tablet 3  . metFORMIN (GLUCOPHAGE-XR) 500 MG 24 hr tablet Take 1 tablet (500 mg total) by mouth 2 (two) times daily. 360 tablet 1  . Multiple Vitamin (MULTIVITAMIN WITH MINERALS) TABS tablet Take 1 tablet by mouth daily.    . pantoprazole (PROTONIX) 40 MG tablet Take 1 tablet (40 mg total) by mouth 2 (two) times daily. 60  tablet 0  . polyethylene glycol (MIRALAX / GLYCOLAX) 17 g packet Take 17 g by mouth daily as needed for moderate constipation. 30 each 0  . pravastatin (PRAVACHOL) 40 MG tablet TAKE 1 TABLET BY MOUTH ONCE DAILY IN THE EVENING 90 tablet 0  . prochlorperazine (COMPAZINE) 10 MG tablet Take 1 tablet (10 mg total) by mouth every 6 (six) hours as needed for nausea or vomiting. 30 tablet 1  . traMADol (ULTRAM) 50 MG tablet Take 1 tablet (50 mg total) by mouth every 6 (six) hours as needed for moderate pain. 90 tablet 0  . traZODone (DESYREL) 100 MG tablet TAKE 1 TABLET BY MOUTH AT BEDTIME 30 tablet 0   No current facility-administered medications for this visit.    Facility-Administered Medications Ordered in Other Visits  Medication Dose Route Frequency Provider Last Rate Last Dose  . sodium chloride flush (NS) 0.9 % injection 10 mL  10 mL Intracatheter PRN [name], MD   10 mL at 12/27/18 1825    PHYSICAL EXAMINATION: ECOG PERFORMANCE STATUS: 2 - Symptomatic, <50% confined to bed  Vitals:   06/14/19 1112  BP: 110/65  Pulse: (!) 102  Resp: 17  Temp: 98.3 F (36.8 C)  SpO2: 99%   Filed Weights    GENERAL:alert, no distress and comfortable SKIN: skin color, texture, turgor are normal, no rashes or significant lesions EYES: normal, Conjunctiva are pink and non-injected, sclera clear OROPHARYNX:no exudate, no erythema and lips, buccal mucosa, and tongue normal  NECK: supple, thyroid normal size, non-tender, without nodularity LYMPH:  no palpable lymphadenopathy in the cervical, axillary or inguinal LUNGS: clear to auscultation and percussion with normal breathing effort HEART: regular rate & rhythm and no murmurs with mild bilateral lower extremity edema ABDOMEN:abdomen soft, non-tender and normal bowel sounds Musculoskeletal:no cyanosis of digits and no clubbing  NEURO: alert & oriented x 3 with fluent speech, no focal motor/sensory deficits  LABORATORY DATA:  I have reviewed the  data as listed    Component Value Date/Time   NA 136 06/14/2019 1039   NA 137 05/31/2017 0931   K 3.4 (L) 06/14/2019 1039   K 3.5 05/31/2017 0931   CL 96 (L) 06/14/2019 1039   CL 99 12/20/2012 1336   CO2 28 06/14/2019 1039   CO2 25 05/31/2017 0931   GLUCOSE 132 (H) 06/14/2019 1039   GLUCOSE 161 (H) 05/31/2017 0931   GLUCOSE 110 (H) 12/20/2012 1336   BUN 8 06/14/2019 1039   BUN 10.9 05/31/2017 0931   CREATININE 0.71 06/14/2019 1039   CREATININE 0.63 07/05/2018 1009   CREATININE 0.8 05/31/2017 0931   CALCIUM 9.3 06/14/2019 1039   CALCIUM 10.6 (H) 05/31/2017 0931   PROT 6.4 (L) 06/14/2019 1039   PROT 7.5 05/31/2017 0931   ALBUMIN 2.9 (L) 06/14/2019 1039   ALBUMIN 4.2 05/31/2017 0931   AST 25 06/14/2019 1039   AST 16 05/31/2017 0931   ALT 18 06/14/2019 1039   ALT 15 05/31/2017 0931   ALKPHOS 195 (H) 06/14/2019 1039   ALKPHOS 91 05/31/2017 0931   BILITOT 0.7 06/14/2019  1039   BILITOT 0.33 05/31/2017 0931   GFRNONAA >60 06/14/2019 1039   GFRNONAA 87 07/05/2018 1009   GFRAA >60 06/14/2019 1039   GFRAA 100 07/05/2018 1009    No results found for: SPEP, UPEP  Lab Results  Component Value Date   WBC 6.3 06/14/2019   NEUTROABS 5.0 06/14/2019   HGB 10.0 (L) 06/14/2019   HCT 31.3 (L) 06/14/2019   MCV 79.8 (L) 06/14/2019   PLT 436 (H) 06/14/2019      Chemistry      Component Value Date/Time   NA 136 06/14/2019 1039   NA 137 05/31/2017 0931   K 3.4 (L) 06/14/2019 1039   K 3.5 05/31/2017 0931   CL 96 (L) 06/14/2019 1039   CL 99 12/20/2012 1336   CO2 28 06/14/2019 1039   CO2 25 05/31/2017 0931   BUN 8 06/14/2019 1039   BUN 10.9 05/31/2017 0931   CREATININE 0.71 06/14/2019 1039   CREATININE 0.63 07/05/2018 1009   CREATININE 0.8 05/31/2017 0931      Component Value Date/Time   CALCIUM 9.3 06/14/2019 1039   CALCIUM 10.6 (H) 05/31/2017 0931   ALKPHOS 195 (H) 06/14/2019 1039   ALKPHOS 91 05/31/2017 0931   AST 25 06/14/2019 1039   AST 16 05/31/2017 0931   ALT 18  06/14/2019 1039   ALT 15 05/31/2017 0931   BILITOT 0.7 06/14/2019 1039   BILITOT 0.33 05/31/2017 0931       RADIOGRAPHIC STUDIES: I have personally reviewed the radiological images as listed and agreed with the findings in the report. Ct Head Wo Contrast  Result Date: 05/22/2019 CLINICAL DATA:  78 year old female with increased weakness and fever. 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 EXAM: CT HEAD WITHOUT CONTRAST TECHNIQUE: Contiguous axial images were obtained from the base of the skull through the vertex without intravenous contrast. COMPARISON:  Head and cervical spine CT 05/01/2019 and earlier. FINDINGS: Brain: Stable cerebral volume. Stable gray-white matter differentiation throughout the brain. No midline shift, ventriculomegaly, mass effect, evidence of mass lesion, intracranial hemorrhage or evidence of cortically based acute infarction. No cortical encephalomalacia. Vascular: Calcified atherosclerosis at the skull base. No suspicious intracranial vascular hyperdensity. Skull: Stable. No acute or suspicious osseous lesion. Sinuses/Orbits: Visualized paranasal sinuses and mastoids are stable and well pneumatized. Other: Visualized orbits and scalp soft tissues are within normal limits. IMPRESSION: Stable and negative for age noncontrast CT appearance of the brain. Note that early metastatic disease to the brain cannot be excluded in the absence of intravenous contrast. Electronically Signed   By: Genevie Ann M.D.   On: 05/22/2019 23:32   Ct Abdomen Pelvis W Contrast  Result Date: 05/26/2019 CLINICAL DATA:  Neutropenia, fever and weakness. Metastatic endometrial cancer. EXAM: CT ABDOMEN AND PELVIS WITH CONTRAST TECHNIQUE: Multidetector CT imaging of the abdomen and pelvis was performed using the standard protocol following bolus administration of intravenous contrast. CONTRAST:  163m OMNIPAQUE IOHEXOL 300 MG/ML  SOLN COMPARISON:  CT scan 04/13/2019 FINDINGS: Lower chest:  Small right pleural effusion with overlying atelectasis, new since the prior study. Minimal left basilar atelectasis. The heart is normal in size. No pericardial effusion. Hepatobiliary: Enlarging necrotic appearing liver lesion occupying most of the upper right hepatic lobe and part of the left lobe. It measures 13.5 x 13.0 cm and previously measured 13.0 x 9.8 cm. A perfusion abnormality is noted around the lesion. Moderate mass effect on the intrahepatic IVC. The gallbladder is contracted.  No common bile duct dilatation. Pancreas:  No mass, inflammation or ductal dilatation. Spleen: Normal size. No focal lesions. Small accessory spleens are noted. Adrenals/Urinary Tract: Mild nodularity of the left adrenal gland is stable. Left renal cyst is stable. No worrisome renal lesions or hydronephrosis. The bladder is mildly distended but no bladder mass or calculi. Stomach/Bowel: The stomach, duodenum, small bowel and colon are grossly normal without oral contrast. No acute inflammatory changes, mass lesions or obstructive findings. The terminal ileum is normal. The appendix is normal. Vascular/Lymphatic: Stable advanced atherosclerotic calcifications involving the aorta iliac arteries but no aneurysm or dissection. The branch vessels are patent. No mesenteric or retroperitoneal lymphadenopathy. No omental disease is identified. Reproductive: Surgically absent. Other: No pelvic mass or pelvic lymphadenopathy. There is a small amount of fluid and edema in the presacral space. No inguinal mass or inguinal adenopathy. Musculoskeletal: Stable pathologic fracture involving the left iliac bone which is destroyed. Stable associated large soft tissue mass involving the iliac side and gluteus muscles. The left SI joint is mildly widened and the pubic symphysis again demonstrates diastasis. IMPRESSION: 1. Enlarging large necrotic hepatic mass. 2. Stable pathologic fracture of the left iliac bone and large destructive tumor  invading the left iliacus muscle and left gluteus medius muscle. 3. New small right pleural effusion and bibasilar atelectasis. Electronically Signed   By: Marijo Sanes M.D.   On: 05/26/2019 08:44   Dg Chest Port 1 View  Result Date: 05/22/2019 CLINICAL DATA:  78 y/o  F; sepsis. EXAM: PORTABLE CHEST 1 VIEW COMPARISON:  12/05/2018 CT of the chest. FINDINGS: Normal cardiac silhouette. Aortic atherosclerosis with calcification. Right port catheter tip projects over lower SVC. Streaky opacity at the right lung base. No pleural effusion or pneumothorax. No acute osseous abnormality is evident. IMPRESSION: Streaky opacity at the right lung base may represent atelectasis or pneumonia. Electronically Signed   By: Kristine Garbe M.D.   On: 05/22/2019 20:45    All questions were answered. The patient knows to call the clinic with any problems, questions or concerns. No barriers to learning was detected.  I spent 15 minutes counseling the patient face to face. The total time spent in the appointment was 20 minutes and more than 50% was on counseling and review of test results  [name], MD 06/15/2019 1:34 PM

## 2019-06-15 NOTE — Assessment & Plan Note (Signed)
She has persistent anemia but not symptomatic We will monitor her blood counts carefully She does not need transfusion support today

## 2019-06-15 NOTE — Telephone Encounter (Signed)
Called with below message. She is still taking. Rx sent to pharmacy.

## 2019-06-15 NOTE — Assessment & Plan Note (Signed)
She continues to have poor oral intake We discussed strategies to improve appetite She has taken appetite stimulant in the past I encouraged her to have frequent small meals as tolerated

## 2019-06-15 NOTE — Telephone Encounter (Signed)
pls call her if she needs this

## 2019-06-15 NOTE — Assessment & Plan Note (Signed)
She has significant fluid retention likely due to dexamethasone We discussed discontinuation of chemotherapy and encourage ambulation as tolerated

## 2019-06-21 ENCOUNTER — Encounter: Payer: Self-pay | Admitting: Hematology and Oncology

## 2019-06-21 ENCOUNTER — Inpatient Hospital Stay: Payer: Medicare Other

## 2019-06-21 ENCOUNTER — Other Ambulatory Visit: Payer: Self-pay

## 2019-06-21 ENCOUNTER — Inpatient Hospital Stay (HOSPITAL_BASED_OUTPATIENT_CLINIC_OR_DEPARTMENT_OTHER): Payer: Medicare Other | Admitting: Hematology and Oncology

## 2019-06-21 DIAGNOSIS — R600 Localized edema: Secondary | ICD-10-CM | POA: Diagnosis not present

## 2019-06-21 DIAGNOSIS — D6481 Anemia due to antineoplastic chemotherapy: Secondary | ICD-10-CM

## 2019-06-21 DIAGNOSIS — Z5111 Encounter for antineoplastic chemotherapy: Secondary | ICD-10-CM | POA: Diagnosis not present

## 2019-06-21 DIAGNOSIS — Z7189 Other specified counseling: Secondary | ICD-10-CM

## 2019-06-21 DIAGNOSIS — C541 Malignant neoplasm of endometrium: Secondary | ICD-10-CM

## 2019-06-21 DIAGNOSIS — C7951 Secondary malignant neoplasm of bone: Secondary | ICD-10-CM

## 2019-06-21 DIAGNOSIS — G893 Neoplasm related pain (acute) (chronic): Secondary | ICD-10-CM | POA: Diagnosis not present

## 2019-06-21 DIAGNOSIS — C787 Secondary malignant neoplasm of liver and intrahepatic bile duct: Secondary | ICD-10-CM

## 2019-06-21 DIAGNOSIS — T451X5A Adverse effect of antineoplastic and immunosuppressive drugs, initial encounter: Secondary | ICD-10-CM

## 2019-06-21 DIAGNOSIS — R5381 Other malaise: Secondary | ICD-10-CM

## 2019-06-21 LAB — CBC WITH DIFFERENTIAL (CANCER CENTER ONLY)
Abs Immature Granulocytes: 0.06 10*3/uL (ref 0.00–0.07)
Basophils Absolute: 0 10*3/uL (ref 0.0–0.1)
Basophils Relative: 0 %
Eosinophils Absolute: 0 10*3/uL (ref 0.0–0.5)
Eosinophils Relative: 0 %
HCT: 30.8 % — ABNORMAL LOW (ref 36.0–46.0)
Hemoglobin: 9.9 g/dL — ABNORMAL LOW (ref 12.0–15.0)
Immature Granulocytes: 1 %
Lymphocytes Relative: 7 %
Lymphs Abs: 0.7 10*3/uL (ref 0.7–4.0)
MCH: 24.8 pg — ABNORMAL LOW (ref 26.0–34.0)
MCHC: 32.1 g/dL (ref 30.0–36.0)
MCV: 77.2 fL — ABNORMAL LOW (ref 80.0–100.0)
Monocytes Absolute: 0.5 10*3/uL (ref 0.1–1.0)
Monocytes Relative: 6 %
Neutro Abs: 8.6 10*3/uL — ABNORMAL HIGH (ref 1.7–7.7)
Neutrophils Relative %: 86 %
Platelet Count: 479 10*3/uL — ABNORMAL HIGH (ref 150–400)
RBC: 3.99 MIL/uL (ref 3.87–5.11)
RDW: 21.2 % — ABNORMAL HIGH (ref 11.5–15.5)
WBC Count: 9.9 10*3/uL (ref 4.0–10.5)
nRBC: 0 % (ref 0.0–0.2)

## 2019-06-21 LAB — CMP (CANCER CENTER ONLY)
ALT: 18 U/L (ref 0–44)
AST: 30 U/L (ref 15–41)
Albumin: 2.8 g/dL — ABNORMAL LOW (ref 3.5–5.0)
Alkaline Phosphatase: 200 U/L — ABNORMAL HIGH (ref 38–126)
Anion gap: 11 (ref 5–15)
BUN: 10 mg/dL (ref 8–23)
CO2: 27 mmol/L (ref 22–32)
Calcium: 9.5 mg/dL (ref 8.9–10.3)
Chloride: 97 mmol/L — ABNORMAL LOW (ref 98–111)
Creatinine: 0.69 mg/dL (ref 0.44–1.00)
GFR, Est AFR Am: >60 mL/min (ref >60)
GFR, Estimated: >60 mL/min (ref >60)
Glucose, Bld: 120 mg/dL — ABNORMAL HIGH (ref 70–99)
Potassium: 3.6 mmol/L (ref 3.5–5.1)
Sodium: 135 mmol/L (ref 135–145)
Total Bilirubin: 0.7 mg/dL (ref 0.3–1.2)
Total Protein: 6.5 g/dL (ref 6.5–8.1)

## 2019-06-21 LAB — SAMPLE TO BLOOD BANK

## 2019-06-21 NOTE — Assessment & Plan Note (Signed)
She complained of fatigue She appears somewhat debilitated She has completed home physical therapy She would continue to try to exercise at home. I did offer her potential referral to cancer rehab facility if needed for more physical therapy.

## 2019-06-21 NOTE — Assessment & Plan Note (Signed)
She has minimum pain She has medicine to take as needed for pain

## 2019-06-21 NOTE — Assessment & Plan Note (Signed)
She is recovering well from recent side effects of treatment She will resume chemotherapy next week I plan to see her at the end of the week for further supportive care and transfusion support as needed The plan of care is also discussed with her son

## 2019-06-21 NOTE — Progress Notes (Signed)
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[facility]) She is recovering well from recent side effects of treatment She will resume chemotherapy next week I plan to see her at the end of the week for further supportive care and transfusion support as needed The plan of care is also discussed with her son  Anemia due to antineoplastic chemotherapy She has persistent anemia but not symptomatic We will monitor her blood counts carefully She does not need transfusion support today  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 has minimum pain She has medicine to take as needed for pain  Fluid retention in legs She has significant fluid retention likely due to dexamethasone We discussed no diuretic therapy and encourage ambulation as tolerated  Physical debility She complained of fatigue She appears somewhat debilitated She has completed home physical therapy She would continue to try to exercise at home. I did offer her potential referral to cancer rehab facility if needed for more physical therapy.   No orders of the defined types were placed in this encounter.   INTERVAL HISTORY: Please see below for problem oriented charting. 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 SEE COMMENT.  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 The greatest extent of invasive carcinoma as measured on the needle core biopsy measures 0.6 cm.     Endometrial cancer ([place])  02/03/2002 Pathology Results   1. BENIGN ENDOMETRIAL POLYPS.  2. UTERINE FIBROIDS: PORTIONS OF SMOOTH MUSCLE CONSISTENT WITH BENIGN LEIOMYOMA(S). PORTIONS OF BENIGN, CYSTICALLY ATROPHIC ENDOMETRIUM WITHOUT HYPERPLASIA OR EVIDENCE OF MALIGNANCY. 3. ENDOMETRIAL CURETTAGE: BENIGN ENDOMETRIUM AND SMOOTH MUSCLE.   09/07/2016 Pathology Results   PAP smear positive for malignant cells   09/07/2016 Initial Diagnosis   Patient had postmenopausal bleeding in 08-2016. She had PAP 09-07-16 by PCP Dr [name], which documented carcinoma. She had evaluation by Dr [name] with colposcopy/ ECC/endometrial biopsy documenting serous endometrioid endometrial carcinoma.   09/15/2016 Imaging   Ct imaging showed markedly thickened and heterogeneous endometrial stripe suspicious for endometrial carcinoma in this patient with postmenopausal vaginal bleeding. Cystic lesion in the right ovary cannot be definitively characterized. Pelvic ultrasound is recommended for further evaluation. Aortoiliac atherosclerosis. Avascular necrosis of the femoral heads bilaterally.   10/07/2016 Tumor Marker   Patient's tumor was tested for the following markers: CA125 Results of the tumor marker test revealed 15.1   10/13/2016 Pathology Results   1. Lymph node, sentinel,  biopsy, right obturator - THERE IS  NO EVIDENCE OF CARCINOMA IN 8 OF 8 LYMPH NODES (0/8). - SEE COMMENT. 2. Lymph node, sentinel, biopsy, left obturator - THERE IS NO EVIDENCE OF CARCINOMA IN 1 OF 1 LYMPH NODE (0/1). - SEE COMMENT. 3. Lymph node, sentinel, biopsy, left para-aortic - THERE IS NO EVIDENCE OF CARCINOMA IN 4 OF 4 LYMPH NODES (0/4). - SEE COMMENT. 4. Uterus +/- tubes/ovaries, neoplastic, cervix - INVASIVE MIXED SEROUS/ENDOMETRIOID ADENOCARCINOMA, MULTIPLE FOCI, FIGO GRADE III, THE LARGEST FOCUS SPANS 8.2 CM. - ADENOCARCINOMA EXTENDS INTO THE OUTER HALF OF THE MYOMETRIUM AND INVOLVES THE STROMA OF THE LOWER UTERINE SEGMENT AND CERVIX. - LYMPHOVASCULAR INVASION IS IDENTIFIED. - THE SURGICAL RESECTION MARGINS ARE NEGATIVE FOR CARCINOMA. - SEE ONCOLOGY TABLE BELOW. ADDITIONAL FINDINGS: - ENDOMETRIUM: ENDOMETRIOID TYPE POLYP(S), WHICH CONTAIN FOCI OF SIMILAR APPEARING MIXED SEROUS/ENDOMETRIOID ADENOCARCINOMA. - MYOMETRIUM: LEIOMYOMATA. - SEROSA: UNREMARKABLE. - BILATERAL ADNEXA: BENIGN OVARIES AND FALLOPIAN TUBES.   10/13/2016 Surgery   Surgery: Total robotic hysterectomy bilateral salpingo-oophorectomy, bilateral pelvic sentinel lymph node removal, left PA sentinel lymph node removal. Mini-laparotomy for specimen removal Surgeons:  [name], MD; [name], MD  Pathology:  1)Uterus, cervix, bilateral tubes and ovaries 2) Right obturator SLN 3) Left Obturator SLN 4) Left PA SLN Operative findings: 14 week fibroid uterus with one large dominant 6 cm myoma that was palpably calcified. 4 cm right ovarian cyst. + SLN identified in the bilateral obturator spaces, + SLN identified in the left PA space.    12/01/2016 - 05/31/2017 Chemotherapy   She received carboplatin & Taxol. She had 3 cycles of chemo followed by radiation and then 3 more cycles of chemo   01/11/2017 Genetic Testing   Testing was normal and did not reveal a mutation in these genes: The genes  tested were the 80 genes on [employer]'s Multi-Cancer panel (ALK, APC, ATM, AXIN2, BAP1, BARD1, BLM, BMPR1A, BRCA1, BRCA2, BRIP1, CASR, CDC73, CDH1, CDK4, CDKN1B, CDKN1C, CDKN2A, CEBPA, CHEK2, DICER1, DIS3L2, EGFR, EPCAM, FH, FLCN, GATA2, GPC3, GREM1, HOXB13,  HRAS, KIT, MAX, MEN1, MET, MITF, MLH1, MSH2, MSH6, MUTYH, NBN, NF1, NF2, PALB2, PDGFRA, PHOX2B, PMS2, POLD1, POLE, POT1, PRKAR1A, PTCH1, PTEN, RAD50, RAD51C, RAD51D, RB1, RECQL4, RET, RUNX1, SDHA, SDHAF2, SDHB, SDHC, SDHD, SMAD4, SMARCA4, SMARCB1, SMARCE1, STK11, SUFU, TERC, TERT, TMEM127, TP53, TSC1, TSC2, VHL, WRN, and WT1).   02/10/2017 - 04/12/2017 Radiation Therapy   02/10/17-03/16/17; 03/29/17-04/12/17;  1) Pelvis/ 45 Gy in 25 fractions  2) Vaginal Cuff/ 18 Gy in 3 fractions   08/26/2018 Imaging   Large destructive mass lesion left iliac bone with large soft tissue mass extending into the posterior soft tissues and pelvis compatible with metastatic disease. Consider follow-up CT chest abdomen pelvis with contrast for further staging.  Radiation changes at L5 and the sacrum. No lumbar metastatic deposits  Multilevel degenerative changes above.   08/29/2018 Imaging   1. 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 2. New irregular nodular opacity in the medial right middle lobe, new since 2017 CT. Separate subcentimeter right upper lobe solid pulmonary nodule, for which no comparison exists. These findings are indeterminate for pulmonary metastases and attention on follow-up chest CT in 3 months is recommended. 3. No lymphadenopathy or additional findings of metastatic disease.No tumor recurrence at the hysterectomy margin. 4. Aortic Atherosclerosis (ICD10-I70.0). Additional chronic findings as detailed.   08/29/2018 Tumor Marker   Patient's tumor was tested for the following markers: CA-125 Results of the tumor marker test revealed 17.4   09/06/2018 Procedure   CT-guided  core biopsy performed of  huge mass in the left iliac fossa destroying the left iliac bone.   09/06/2018 Pathology Results   Soft Tissue Needle Core Biopsy, Left Iliac Fossa - POORLY DIFFERENTIATED MALIGNANT NEOPLASM. Microscopic Comment The provided clinical history of both endometrial and breast cancer is noted. In the current case, immunohistochemistry for qualitative ER demonstrates scattered positive staining. CK8/18 is focally positive. CKAE1/3, CK7, CK20, TTF-1, CDX-2, GATA3 and PAX 8 are negative. The immunophenotype is not specific as to an origin for this poorly differentiated malignant neoplasm. The morphology is more suggestive of endometrial cancer than breast cancer, and may represent an unrecognized carcinosarcomatous component.   09/08/2018 - 09/27/2018 Radiation Therapy   She had palliative radiation treatment   09/29/2018 Procedure   Placement of single lumen port a cath via right internal jugular vein. The catheter tip lies at the cavo-atrial junction. A power injectable port a cath was placed and is ready for immediate use.   10/04/2018 - 01/17/2019 Chemotherapy   The patient had carboplatin and taxol x 6 cycles   12/05/2018 Tumor Marker   Patient's tumor was tested for the following markers: CA125 Results of the tumor marker test revealed 5.5   12/05/2018 Imaging   1. Considerable cystic degeneration in the large destructive mass of the left iliac bone. New or progressive left iliac bone pathologic fracture extending into the left SI joint, along with abnormal subluxation at the pubic symphysis indicating pubic symphysis instability. 2. Previous pleural-based nodularity in the right middle lobe is markedly improved, currently 4 mm in thickness and previously 11 mm in thickness. 3. Indistinct new wedge-shaped lesion in the left mid kidney, hypodense on portal venous phase images and hyperdense on delayed phase images, suggesting prolong tubular transit. This is a nonspecific finding but could reflect  local inflammation, infection, or less likely infiltrative tumor. 4. Other imaging findings of potential clinical significance: Aortic Atherosclerosis (ICD10-I70.0). Left main coronary artery atherosclerosis. 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   02/15/2019 Imaging   1. Slight progression of large expansile lytic lesion involving the left iliac bone. This demonstrates central low density, suggesting partial necrosis. This lesion was biopsied on 09/06/2018, revealing poorly differentiated malignant neoplasm. 2. New low-density lesion in the dome of the right hepatic lobe worrisome for cystic or treated metastasis. 3. No other evidence of metastatic disease. No explanation for the patient's symptoms. 4. 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    03/03/2019 - 04/07/2019 Anti-estrogen oral therapy   She cannot tolerate tamoxifen and megace   03/23/2019 Tumor Marker   Patient's tumor was tested for the following markers: CA125 Results of the tumor marker test revealed 3.8   04/13/2019 Imaging   1. 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 2. Mild decrease in size of large lytic bone metastasis and associated soft tissue mass involving the left ilium. 3. Unstable pathologic fracture of the left ilium, without significant change since prior exam. 4. New right lower lobe pulmonary metastases.     04/24/2019 -  Chemotherapy   The patient had DOCEtaxel (TAXOTERE) 130 mg in sodium chloride 0.9 % 250 mL chemo infusion, 75 mg/m2 = 130 mg, Intravenous,  Once, 3 of 4 cycles Dose modification: 50 mg/m2 (66.7 % of original dose 75 mg/m2, Cycle 2, Reason: Dose Not Tolerated), 45 mg/m2 (60 % of original dose 75 mg/m2, Cycle 2, Reason:  Dose Not Tolerated), 25 mg/m2 (33.3 % of original dose 75 mg/m2, Cycle 3, Reason: Dose Not  Tolerated) Administration: 130 mg (04/24/2019), 80 mg (05/15/2019), 40 mg (06/05/2019)  for chemotherapy treatment.    05/01/2019 - 05/03/2019 Hospital Admission   She was admitted to the hospital for evaluation of fever and weakness   05/22/2019 - 05/26/2019 Hospital Admission   She was admitted to the hospital with recurrent neutropenic fever with no source found   05/26/2019 Imaging   1. Enlarging large necrotic hepatic mass. 2. 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 3. 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([place])  02/16/2019 Initial Diagnosis   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([place])   04/24/2019 -  Chemotherapy   The patient had DOCEtaxel (TAXOTERE) 130 mg in sodium chloride 0.9 % 250 mL chemo infusion, 75 mg/m2 = 130 mg, Intravenous,  Once, 3 of 4 cycles Dose modification: 50 mg/m2 (66.7 % of original dose 75 mg/m2, Cycle 2, Reason: Dose Not Tolerated), 45 mg/m2 (60 % of original dose 75 mg/m2, Cycle 2, Reason: Dose Not Tolerated), 25 mg/m2 (33.3 % of original dose 75 mg/m2, Cycle 3, Reason: Dose Not Tolerated) Administration: 130 mg (04/24/2019), 80 mg (05/15/2019), 40 mg (06/05/2019)  for chemotherapy treatment.      REVIEW OF SYSTEMS:   Constitutional: Denies fevers, chills or abnormal weight loss Eyes:  Denies blurriness of vision Ears, nose, mouth, throat, and face: Denies mucositis or sore throat Respiratory: Denies cough, dyspnea or wheezes Cardiovascular: Denies palpitation, chest discomfort  Gastrointestinal:  Denies nausea, heartburn or change in bowel habits Skin: Denies abnormal skin rashes Lymphatics: Denies new lymphadenopathy or easy bruising Neurological:Denies numbness, tingling or new weaknesses Behavioral/Psych: Mood is stable, no new changes  All other systems were reviewed with the patient and are negative.  I have reviewed the past medical history, past surgical history, social history and family history with the patient and they are unchanged from previous note.  ALLERGIES:  is allergic to omnipaque [iohexol]; calan [verapamil]; effexor [venlafaxine]; erythromycin; femara [letrozole]; fosamax [alendronate]; ibuprofen; paxil [paroxetine hcl]; remeron [mirtazapine]; and vasotec [enalapril].  MEDICATIONS:  Current Outpatient Medications  Medication Sig Dispense Refill  . Calcium Carbonate-Vitamin D (CALCIUM 600 + D PO) Take 1 tablet by mouth 2 (two) times daily.     . Cholecalciferol (VITAMIN D) 2000 units CAPS Take 2,000 Units by mouth every other day.    Marland Kitchen dexamethasone (DECADRON) 4 MG tablet Take 2 mg on Mondays and Thursdays only 60 tablet 0  . furosemide (LASIX) 20 MG tablet Take 1 tablet (20 mg total) by mouth daily as needed for fluid or edema. 30 tablet 1  . HYDROcodone-acetaminophen (NORCO/VICODIN) 5-325 MG tablet Take 1-2 tablets by mouth every 6 (six) hours as needed for severe pain. Flowella  tablet 0  . levothyroxine (SYNTHROID) 88 MCG tablet Take 1 tablet daily on an empty stomach with only water for 30 minutes & no Antacid meds, Calcium or Magnesium for 4 hours & avoid Biotin 90 tablet 3  . metFORMIN (GLUCOPHAGE-XR) 500 MG 24 hr tablet Take 1 tablet (500 mg total) by mouth 2 (two) times daily. 360 tablet 1  . Multiple Vitamin (MULTIVITAMIN WITH MINERALS) TABS tablet  Take 1 tablet by mouth daily.    . pantoprazole (PROTONIX) 40 MG tablet Take 1 tablet (40 mg total) by mouth 2 (two) times daily. 60 tablet 0  . polyethylene glycol (MIRALAX / GLYCOLAX) 17 g packet Take 17 g by mouth daily as needed for moderate constipation. 30 each 0  . pravastatin (PRAVACHOL) 40 MG tablet TAKE 1 TABLET BY MOUTH ONCE DAILY IN THE EVENING 90 tablet 0  . prochlorperazine (COMPAZINE) 10 MG tablet Take 1 tablet (10 mg total) by mouth every 6 (six) hours as needed for nausea or vomiting. 30 tablet 1  . traMADol (ULTRAM) 50 MG tablet Take 1 tablet (50 mg total) by mouth every 6 (six) hours as needed for moderate pain. 90 tablet 0  . traZODone (DESYREL) 100 MG tablet TAKE 1 TABLET BY MOUTH AT BEDTIME 30 tablet 0   No current facility-administered medications for this visit.    Facility-Administered Medications Ordered in Other Visits  Medication Dose Route Frequency Provider Last Rate Last Dose  . sodium chloride flush (NS) 0.9 % injection 10 mL  10 mL Intracatheter PRN [name], MD   10 mL at 12/27/18 1825    PHYSICAL EXAMINATION: ECOG PERFORMANCE STATUS: 2 - Symptomatic, <50% confined to bed  Vitals:   06/21/19 1029  BP: 130/65  Pulse: 93  Resp: 18  Temp: (!) 96.2 F (35.7 C)  SpO2: 99%   Filed Weights   06/21/19 1029  Weight: 143 lb 3.2 oz (65 kg)    GENERAL:alert, no distress and comfortable SKIN: skin color, texture, turgor are normal, no rashes or significant lesions EYES: normal, Conjunctiva are pink and non-injected, sclera clear OROPHARYNX:no exudate, no erythema and lips, buccal mucosa, and tongue normal  NECK: supple, thyroid normal size, non-tender, without nodularity LYMPH:  no palpable lymphadenopathy in the cervical, axillary or inguinal LUNGS: clear to auscultation and percussion with normal breathing effort HEART: regular rate & rhythm and no murmurs with moderate bilateral lower extremity edema ABDOMEN:abdomen soft, non-tender and normal bowel  sounds Musculoskeletal:no cyanosis of digits and no clubbing  NEURO: alert & oriented x 3 with fluent speech, no focal motor/sensory deficits  LABORATORY DATA:  I have reviewed the data as listed    Component Value Date/Time   NA 136 06/14/2019 1039   NA 137 05/31/2017 0931   K 3.4 (L) 06/14/2019 1039   K 3.5 05/31/2017 0931   CL 96 (L) 06/14/2019 1039   CL 99 12/20/2012 1336   CO2 28 06/14/2019 1039   CO2 25 05/31/2017 0931   GLUCOSE 132 (H) 06/14/2019 1039   GLUCOSE 161 (H) 05/31/2017 0931   GLUCOSE 110 (H) 12/20/2012 1336   BUN 8 06/14/2019 1039   BUN 10.9 05/31/2017 0931   CREATININE 0.71 06/14/2019 1039   CREATININE 0.63 07/05/2018 1009   CREATININE 0.8 05/31/2017 0931   CALCIUM 9.3 06/14/2019 1039   CALCIUM 10.6 (H) 05/31/2017 0931   PROT 6.4 (L) 06/14/2019 1039   PROT 7.5 05/31/2017 0931   ALBUMIN 2.9 (L) 06/14/2019 1039   ALBUMIN 4.2 05/31/2017 0931  AST 25 06/14/2019 1039   AST 16 05/31/2017 0931   ALT 18 06/14/2019 1039   ALT 15 05/31/2017 0931   ALKPHOS 195 (H) 06/14/2019 1039   ALKPHOS 91 05/31/2017 0931   BILITOT 0.7 06/14/2019 1039   BILITOT 0.33 05/31/2017 0931   GFRNONAA >60 06/14/2019 1039   GFRNONAA 87 07/05/2018 1009   GFRAA >60 06/14/2019 1039   GFRAA 100 07/05/2018 1009    No results found for: SPEP, UPEP  Lab Results  Component Value Date   WBC 9.9 06/21/2019   NEUTROABS 8.6 (H) 06/21/2019   HGB 9.9 (L) 06/21/2019   HCT 30.8 (L) 06/21/2019   MCV 77.2 (L) 06/21/2019   PLT 479 (H) 06/21/2019      Chemistry      Component Value Date/Time   NA 136 06/14/2019 1039   NA 137 05/31/2017 0931   K 3.4 (L) 06/14/2019 1039   K 3.5 05/31/2017 0931   CL 96 (L) 06/14/2019 1039   CL 99 12/20/2012 1336   CO2 28 06/14/2019 1039   CO2 25 05/31/2017 0931   BUN 8 06/14/2019 1039   BUN 10.9 05/31/2017 0931   CREATININE 0.71 06/14/2019 1039   CREATININE 0.63 07/05/2018 1009   CREATININE 0.8 05/31/2017 0931      Component Value Date/Time    CALCIUM 9.3 06/14/2019 1039   CALCIUM 10.6 (H) 05/31/2017 0931   ALKPHOS 195 (H) 06/14/2019 1039   ALKPHOS 91 05/31/2017 0931   AST 25 06/14/2019 1039   AST 16 05/31/2017 0931   ALT 18 06/14/2019 1039   ALT 15 05/31/2017 0931   BILITOT 0.7 06/14/2019 1039   BILITOT 0.33 05/31/2017 0931       RADIOGRAPHIC STUDIES: I have personally reviewed the radiological images as listed and agreed with the findings in the report. Ct Head Wo Contrast  Result Date: 05/22/2019 CLINICAL DATA:  78 year old female with increased weakness and fever. 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 EXAM: CT HEAD WITHOUT CONTRAST TECHNIQUE: Contiguous axial images were obtained from the base of the skull through the vertex without intravenous contrast. COMPARISON:  Head and cervical spine CT 05/01/2019 and earlier. FINDINGS: Brain: Stable cerebral volume. Stable gray-white matter differentiation throughout the brain. No midline shift, ventriculomegaly, mass effect, evidence of mass lesion, intracranial hemorrhage or evidence of cortically based acute infarction. No cortical encephalomalacia. Vascular: Calcified atherosclerosis at the skull base. No suspicious intracranial vascular hyperdensity. Skull: Stable. No acute or suspicious osseous lesion. Sinuses/Orbits: Visualized paranasal sinuses and mastoids are stable and well pneumatized. Other: Visualized orbits and scalp soft tissues are within normal limits. IMPRESSION: Stable and negative for age noncontrast CT appearance of the brain. Note that early metastatic disease to the brain cannot be excluded in the absence of intravenous contrast. Electronically Signed   By: Genevie Ann M.D.   On: 05/22/2019 23:32   Ct Abdomen Pelvis W Contrast  Result Date: 05/26/2019 CLINICAL DATA:  Neutropenia, fever and weakness. Metastatic endometrial cancer. EXAM: CT ABDOMEN AND PELVIS WITH CONTRAST TECHNIQUE: Multidetector CT imaging of the abdomen and pelvis was performed  using the standard protocol following bolus administration of intravenous contrast. CONTRAST:  155m OMNIPAQUE IOHEXOL 300 MG/ML  SOLN COMPARISON:  CT scan 04/13/2019 FINDINGS: Lower chest: Small right pleural effusion with overlying atelectasis, new since the prior study. Minimal left basilar atelectasis. The heart is normal in size. No pericardial effusion. Hepatobiliary: Enlarging necrotic appearing liver lesion occupying most of the upper right hepatic lobe and part of the  left lobe. It measures 13.5 x 13.0 cm and previously measured 13.0 x 9.8 cm. A perfusion abnormality is noted around the lesion. Moderate mass effect on the intrahepatic IVC. The gallbladder is contracted.  No common bile duct dilatation. Pancreas: No mass, inflammation or ductal dilatation. Spleen: Normal size. No focal lesions. Small accessory spleens are noted. Adrenals/Urinary Tract: Mild nodularity of the left adrenal gland is stable. Left renal cyst is stable. No worrisome renal lesions or hydronephrosis. The bladder is mildly distended but no bladder mass or calculi. Stomach/Bowel: The stomach, duodenum, small bowel and colon are grossly normal without oral contrast. No acute inflammatory changes, mass lesions or obstructive findings. The terminal ileum is normal. The appendix is normal. Vascular/Lymphatic: Stable advanced atherosclerotic calcifications involving the aorta iliac arteries but no aneurysm or dissection. The branch vessels are patent. No mesenteric or retroperitoneal lymphadenopathy. No omental disease is identified. Reproductive: Surgically absent. Other: No pelvic mass or pelvic lymphadenopathy. There is a small amount of fluid and edema in the presacral space. No inguinal mass or inguinal adenopathy. Musculoskeletal: Stable pathologic fracture involving the left iliac bone which is destroyed. Stable associated large soft tissue mass involving the iliac side and gluteus muscles. The left SI joint is mildly widened and  the pubic symphysis again demonstrates diastasis. IMPRESSION: 1. Enlarging large necrotic hepatic mass. 2. 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 3. New small right pleural effusion and bibasilar atelectasis. Electronically Signed   By: Marijo Sanes M.D.   On: 05/26/2019 08:44   Dg Chest Port 1 View  Result Date: 05/22/2019 CLINICAL DATA:  78 y/o  F; sepsis. EXAM: PORTABLE CHEST 1 VIEW COMPARISON:  12/05/2018 CT of the chest. FINDINGS: Normal cardiac silhouette. Aortic atherosclerosis with calcification. Right port catheter tip projects over lower SVC. Streaky opacity at the right lung base. No pleural effusion or pneumothorax. No acute osseous abnormality is evident. IMPRESSION: Streaky opacity at the right lung base may represent atelectasis or pneumonia. Electronically Signed   By: Kristine Garbe M.D.   On: 05/22/2019 20:45    All questions were answered. The patient knows to call the clinic with any problems, questions or concerns. No barriers to learning was detected.  I spent 15 minutes counseling the patient face to face. The total time spent in the appointment was 20 minutes and more than 50% was on counseling and review of test results  [name], MD 06/21/2019 10:57 AM

## 2019-06-21 NOTE — Assessment & Plan Note (Signed)
She has persistent anemia but not symptomatic We will monitor her blood counts carefully She does not need transfusion support today

## 2019-06-21 NOTE — Assessment & Plan Note (Signed)
She has significant fluid retention likely due to dexamethasone We discussed no diuretic therapy and encourage ambulation as tolerated

## 2019-06-22 ENCOUNTER — Telehealth: Payer: Self-pay | Admitting: Hematology and Oncology

## 2019-06-22 NOTE — Telephone Encounter (Signed)
I talk with patient regarding schedule  

## 2019-06-26 ENCOUNTER — Other Ambulatory Visit: Payer: Medicare Other

## 2019-06-26 ENCOUNTER — Inpatient Hospital Stay: Payer: Medicare Other | Attending: Hematology and Oncology

## 2019-06-26 ENCOUNTER — Other Ambulatory Visit: Payer: Self-pay

## 2019-06-26 ENCOUNTER — Ambulatory Visit: Payer: Medicare Other | Admitting: Hematology and Oncology

## 2019-06-26 VITALS — BP 141/74 | HR 96 | Temp 98.0°F | Resp 18

## 2019-06-26 DIAGNOSIS — I10 Essential (primary) hypertension: Secondary | ICD-10-CM | POA: Insufficient documentation

## 2019-06-26 DIAGNOSIS — Z66 Do not resuscitate: Secondary | ICD-10-CM | POA: Insufficient documentation

## 2019-06-26 DIAGNOSIS — C7951 Secondary malignant neoplasm of bone: Secondary | ICD-10-CM

## 2019-06-26 DIAGNOSIS — C787 Secondary malignant neoplasm of liver and intrahepatic bile duct: Secondary | ICD-10-CM | POA: Diagnosis not present

## 2019-06-26 DIAGNOSIS — Z923 Personal history of irradiation: Secondary | ICD-10-CM | POA: Insufficient documentation

## 2019-06-26 DIAGNOSIS — R748 Abnormal levels of other serum enzymes: Secondary | ICD-10-CM | POA: Insufficient documentation

## 2019-06-26 DIAGNOSIS — G893 Neoplasm related pain (acute) (chronic): Secondary | ICD-10-CM | POA: Diagnosis not present

## 2019-06-26 DIAGNOSIS — C541 Malignant neoplasm of endometrium: Secondary | ICD-10-CM | POA: Diagnosis present

## 2019-06-26 DIAGNOSIS — Z5111 Encounter for antineoplastic chemotherapy: Secondary | ICD-10-CM | POA: Insufficient documentation

## 2019-06-26 DIAGNOSIS — Z79899 Other long term (current) drug therapy: Secondary | ICD-10-CM | POA: Insufficient documentation

## 2019-06-26 DIAGNOSIS — Z9221 Personal history of antineoplastic chemotherapy: Secondary | ICD-10-CM | POA: Insufficient documentation

## 2019-06-26 DIAGNOSIS — D61818 Other pancytopenia: Secondary | ICD-10-CM | POA: Diagnosis not present

## 2019-06-26 DIAGNOSIS — Z7984 Long term (current) use of oral hypoglycemic drugs: Secondary | ICD-10-CM | POA: Diagnosis not present

## 2019-06-26 DIAGNOSIS — Z7189 Other specified counseling: Secondary | ICD-10-CM

## 2019-06-26 DIAGNOSIS — J90 Pleural effusion, not elsewhere classified: Secondary | ICD-10-CM | POA: Diagnosis not present

## 2019-06-26 MED ORDER — DEXAMETHASONE SODIUM PHOSPHATE 10 MG/ML IJ SOLN
10.0000 mg | Freq: Once | INTRAMUSCULAR | Status: AC
  Administered 2019-06-26: 11:00:00 10 mg via INTRAVENOUS

## 2019-06-26 MED ORDER — DEXAMETHASONE SODIUM PHOSPHATE 10 MG/ML IJ SOLN
INTRAMUSCULAR | Status: AC
  Filled 2019-06-26: qty 1

## 2019-06-26 MED ORDER — SODIUM CHLORIDE 0.9 % IV SOLN
25.0000 mg/m2 | Freq: Once | INTRAVENOUS | Status: AC
  Administered 2019-06-26: 40 mg via INTRAVENOUS
  Filled 2019-06-26: qty 4

## 2019-06-26 MED ORDER — SODIUM CHLORIDE 0.9 % IV SOLN
Freq: Once | INTRAVENOUS | Status: AC
  Administered 2019-06-26: 11:00:00 via INTRAVENOUS
  Filled 2019-06-26: qty 250

## 2019-06-26 MED ORDER — SODIUM CHLORIDE 0.9% FLUSH
10.0000 mL | INTRAVENOUS | Status: DC | PRN
  Administered 2019-06-26: 10 mL
  Filled 2019-06-26: qty 10

## 2019-06-26 MED ORDER — HEPARIN SOD (PORK) LOCK FLUSH 100 UNIT/ML IV SOLN
500.0000 [IU] | Freq: Once | INTRAVENOUS | Status: AC | PRN
  Administered 2019-06-26: 500 [IU]
  Filled 2019-06-26: qty 5

## 2019-06-26 NOTE — Patient Instructions (Signed)
Coronavirus (COVID-19) Are you at risk?  Are you at risk for the Coronavirus (COVID-19)?  To be considered HIGH RISK for Coronavirus (COVID-19), you have to meet the following criteria:  . Traveled to [place], [place], [place], [place] or [place]; or in the [place] to [place], [place], [place], or [place]; and have fever, cough, and shortness of breath within the last 2 weeks of travel OR . Been in close contact with a person diagnosed with COVID-19 within the last 2 weeks and have fever, cough, and shortness of breath . IF YOU DO NOT MEET THESE CRITERIA, YOU ARE CONSIDERED LOW RISK FOR COVID-19.  What to do if you are HIGH RISK for COVID-19?  . If you are having a medical emergency, call 911. . Seek medical care right away. Before you go to a doctor's office, urgent care or emergency department, call ahead and tell them about your recent travel, contact with someone diagnosed with COVID-19, and your symptoms. You should receive instructions from your physician's office regarding next steps of care.  . When you arrive at healthcare provider, tell the healthcare staff immediately you have returned from visiting [place], [place], [place], [place] or [place]; or traveled in the [place] to [place], [place], [place], or [place]; in the last two weeks or you have been in close contact with a person diagnosed with COVID-19 in the last 2 weeks.   . Tell the health care staff about your symptoms: fever, cough and shortness of breath. . After you have been seen by a medical provider, you will be either: o Tested for (COVID-19) and discharged home on quarantine except to seek medical care if symptoms worsen, and asked to  - Stay home and avoid contact with others until you get your results (4-5 days)  - Avoid travel on public transportation if possible (such as bus, train, or airplane) or o Sent to the Emergency Department by EMS for evaluation, COVID-19 testing, and possible  admission depending on your condition and test results.  What to do if you are LOW RISK for COVID-19?  Reduce your risk of any infection by using the same precautions used for avoiding the common cold or flu:  . Wash your hands often with soap and warm water for at least 20 seconds.  If soap and water are not readily available, use an alcohol-based hand sanitizer with at least 60% alcohol.  . If coughing or sneezing, cover your mouth and nose by coughing or sneezing into the elbow areas of your shirt or coat, into a tissue or into your sleeve (not your hands). . Avoid shaking hands with others and consider head nods or verbal greetings only. . Avoid touching your eyes, nose, or mouth with unwashed hands.  . Avoid close contact with people who are sick. . Avoid places or events with large numbers of people in one location, like concerts or sporting events. . Carefully consider travel plans you have or are making. . If you are planning any travel outside or inside the [place], visit the CDC's Travelers' Health webpage for the latest health notices. . If you have some symptoms but not all symptoms, continue to monitor at home and seek medical attention if your symptoms worsen. . If you are having a medical emergency, call 911.   ADDITIONAL HEALTHCARE OPTIONS FOR PATIENTS  [facility] / e-Visit: [email or web address]         [facility]: [phone]  Onset   Urgent Care: [facility]: [place] [facility] Discharge Instructions for Patients Receiving Chemotherapy  Today you received the following chemotherapy agents: Taxotere.  To help prevent nausea and vomiting after your treatment, we encourage you to take your nausea medication as directed.    If you develop nausea and vomiting that is not controlled by your nausea medication, call the clinic.   BELOW ARE  SYMPTOMS THAT SHOULD BE REPORTED IMMEDIATELY:  *FEVER GREATER THAN 100.5 F  *CHILLS WITH OR WITHOUT FEVER  NAUSEA AND VOMITING THAT IS NOT CONTROLLED WITH YOUR NAUSEA MEDICATION  *UNUSUAL SHORTNESS OF BREATH  *UNUSUAL BRUISING OR BLEEDING  TENDERNESS IN MOUTH AND THROAT WITH OR WITHOUT PRESENCE OF ULCERS  *URINARY PROBLEMS  *BOWEL PROBLEMS  UNUSUAL RASH Items with * indicate a potential emergency and should be followed up as soon as possible.  Feel free to call the clinic should you have any questions or concerns. The clinic phone number is [phone].  Please show the [facility] at check-in to the Emergency Department and triage nurse.

## 2019-06-29 ENCOUNTER — Emergency Department (HOSPITAL_COMMUNITY)
Admission: EM | Admit: 2019-06-29 | Discharge: 2019-06-30 | Disposition: A | Discharge status: Home / Self Care | Payer: Medicare Other | Attending: Emergency Medicine | Admitting: Emergency Medicine

## 2019-06-29 ENCOUNTER — Other Ambulatory Visit: Payer: Self-pay

## 2019-06-29 ENCOUNTER — Encounter (HOSPITAL_COMMUNITY): Payer: Self-pay | Admitting: Emergency Medicine

## 2019-06-29 DIAGNOSIS — I10 Essential (primary) hypertension: Secondary | ICD-10-CM | POA: Diagnosis not present

## 2019-06-29 DIAGNOSIS — E039 Hypothyroidism, unspecified: Secondary | ICD-10-CM | POA: Insufficient documentation

## 2019-06-29 DIAGNOSIS — Z7984 Long term (current) use of oral hypoglycemic drugs: Secondary | ICD-10-CM | POA: Insufficient documentation

## 2019-06-29 DIAGNOSIS — Z79899 Other long term (current) drug therapy: Secondary | ICD-10-CM | POA: Insufficient documentation

## 2019-06-29 DIAGNOSIS — E860 Dehydration: Secondary | ICD-10-CM | POA: Diagnosis not present

## 2019-06-29 DIAGNOSIS — R0609 Other forms of dyspnea: Secondary | ICD-10-CM | POA: Diagnosis not present

## 2019-06-29 DIAGNOSIS — J90 Pleural effusion, not elsewhere classified: Secondary | ICD-10-CM

## 2019-06-29 DIAGNOSIS — E119 Type 2 diabetes mellitus without complications: Secondary | ICD-10-CM | POA: Diagnosis not present

## 2019-06-29 DIAGNOSIS — R531 Weakness: Secondary | ICD-10-CM | POA: Diagnosis present

## 2019-06-29 MED ORDER — SODIUM CHLORIDE 0.9 % IV BOLUS
1000.0000 mL | Freq: Once | INTRAVENOUS | Status: AC
  Administered 2019-06-30: 1000 mL via INTRAVENOUS

## 2019-06-29 NOTE — ED Provider Notes (Signed)
[facility] EMERGENCY DEPARTMENT Provider Note  CSN: [id number] Arrival date & time: 06/29/19 2324  Chief Complaint(s) Weakness  HPI Tammy Hensley is a 78 y.o. female with a history of metastatic endometrial cancer undergoing chemotherapy who presents to the emergency department with 1 week of generalized fatigue following last treatment of chemotherapy.  She endorses mild diarrhea and decreased appetite related to chemotherapy.  She reports minimal oral intake over the last week.  Patient also endorses mild lightheadedness.  No focal deficits.  No fevers or chills.  No nausea or vomiting.  No chest pain.  She endorses mild DOE which has been improving with physical therapy.  Patient also reports known peripheral edema which is thought to be related to dexamethasone (noted in recent oncology note), which improves with physical activity.  Patient is not on any diuretics.   In route patient received 450cc of IV fluids and reports improvement in her generalized fatigue.  HPI  Past Medical History Past Medical History:  Diagnosis Date  . 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 Breast cancer ([place]) 2006   left breast  . Depression   . Diabetes ([place])    borderline  . Endometrial ca ([place]) 2018  . Family history of cancer   . History of brachytherapy 03/29/17-04/12/17   vaginal cuff 18 Gy in 3 fractions  . History of radiation therapy 02/10/17-03/16/17   pelvis 45 Gy in 25 fractions  . Hyperlipidemia   . Hypertension   . Iron deficiency anemia   . 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 Vitamin D deficiency    Patient Active Problem List   Diagnosis Date Noted  . HCAP (healthcare-associated pneumonia) 05/22/2019  . Neutropenic fever ([place]) 05/22/2019  . Sepsis ([place]) 05/22/2019  . Fall at home, initial encounter 05/22/2019  . Neutropenia ([place]) 05/01/2019  . Hypokalemia 05/01/2019  . Anemia, chronic disease 04/07/2019  . Metastasis  to liver ([place]) 02/16/2019  . Anemia due to antineoplastic chemotherapy 01/17/2019  . Fluid retention in legs 12/06/2018  . Pancytopenia, acquired ([place]) 12/06/2018  . Goals of care, counseling/discussion 09/16/2018  . Metastasis to bone ([place]) 08/30/2018  . 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 Cancer associated pain 08/30/2018  . 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 Other constipation 08/30/2018  . Preventive measure 08/30/2018  . Physical debility 08/30/2018  . 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  . Insomnia 09/07/2017  . T2_NIDDM 01/06/2017  . Genetic testing 01/01/2017  . Family history of cancer   . FIGO stage II endometrial cancer ([place]) 11/17/2016  . Atherosclerosis of abdominal aorta ([place]) 11/17/2016  . Endometrial cancer ([place]) 09/30/2016  . Osteopenia 11/22/2015  . BMI 25.0-25.9,adult 08/24/2015  . Medication management 01/03/2014  . Breast cancer, left breast ([place]) 12/19/2013  . Hypothyroidism   . Hypertension   . Hyperlipidemia   . Vitamin D deficiency   . Iron deficiency anemia   . Malignant neoplasm of upper-outer quadrant of left breast in female, estrogen receptor positive ([place]) 12/05/2012   Home Medication(s) Prior to Admission medications   Medication Sig Start Date End Date Taking? Authorizing Provider  Calcium Carbonate-Vitamin D (CALCIUM 600 + D PO) Take 1 tablet by mouth 2 (two) times daily.    Yes [provider]  Cholecalciferol (VITAMIN D) 2000 units CAPS Take 2,000 Units by mouth every other day.   Yes [provider]  dexamethasone (DECADRON) 4 MG tablet Take 2 mg on Mondays and Thursdays  only Patient taking differently: Take 2 mg by mouth See admin instructions. on Mondays and Thursdays only 06/05/19  Yes Gorsuch, Ni, MD  furosemide (LASIX) 20 MG tablet Take 1 tablet (20 mg total) by mouth daily as needed for fluid or edema. 05/26/19  Yes Rai, Ripudeep K, MD  HYDROcodone-acetaminophen (NORCO/VICODIN) 5-325 MG tablet Take 1-2 tablets by mouth  every 6 (six) hours as needed for severe pain. 05/26/19  Yes Rai, Ripudeep Raliegh Ip, MD  levothyroxine (SYNTHROID) 88 MCG tablet Take 1 tablet daily on an empty stomach with only water for 30 minutes & no Antacid meds, Calcium or Magnesium for 4 hours & avoid Biotin Patient taking differently: Take 88 mcg by mouth daily before breakfast.  05/29/19  Yes [name], MD  metFORMIN (GLUCOPHAGE-XR) 500 MG 24 hr tablet Take 1 tablet (500 mg total) by mouth 2 (two) times daily. 12/06/18  Yes [name], MD  Multiple Vitamin (MULTIVITAMIN WITH MINERALS) TABS tablet Take 1 tablet by mouth daily.   Yes [provider]  pantoprazole (PROTONIX) 40 MG tablet Take 1 tablet (40 mg total) by mouth 2 (two) times daily. 05/26/19 06/29/28 Yes Rai, Ripudeep K, MD  polyethylene glycol (MIRALAX / GLYCOLAX) 17 g packet Take 17 g by mouth daily as needed for moderate constipation. 05/26/19  Yes Rai, Ripudeep K, MD  pravastatin (PRAVACHOL) 40 MG tablet TAKE 1 TABLET BY MOUTH ONCE DAILY IN THE EVENING Patient taking differently: Take 40 mg by mouth at bedtime.  06/07/19  Yes Liane Comber, NP  prochlorperazine (COMPAZINE) 10 MG tablet Take 1 tablet (10 mg total) by mouth every 6 (six) hours as needed for nausea or vomiting. 05/26/19  Yes Rai, Ripudeep K, MD  traMADol (ULTRAM) 50 MG tablet Take 1 tablet (50 mg total) by mouth every 6 (six) hours as needed for moderate pain. 06/08/19  Yes Gorsuch, Ni, MD  traZODone (DESYREL) 100 MG tablet TAKE 1 TABLET BY MOUTH AT BEDTIME Patient taking differently: Take 100 mg by mouth at bedtime.  06/15/19  Yes [name], MD                                                                                                                                    Past Surgical History Past Surgical History:  Procedure Laterality Date  . BREAST LUMPECTOMY Left 2006  . BREAST SURGERY  2006   left breast lumpectomy- radiation, took Femara   . IR IMAGING GUIDED PORT INSERTION  09/29/2018  . perineum  1973    correction  of vaginal and rectal area from birth -did not have episiotomy from first birth  . ROBOTIC ASSISTED LAP VAGINAL HYSTERECTOMY N/A 10/13/2016   Procedure: XI ROBOTIC ASSISTED LAPAROSCOPIC VAGINAL HYSTERECTOMY;  Surgeon: Nancy Marus, MD;  Location: [facility];  Service: Gynecology;  Laterality: N/A;  . ROBOTIC ASSISTED SALPINGO OOPHERECTOMY Bilateral 10/13/2016   Procedure: XI ROBOTIC ASSISTED SALPINGO OOPHORECTOMY;  Surgeon: Nancy Marus, MD;  Location: [facility];  Service: Gynecology;  Laterality: Bilateral;  . SENTINEL NODE BIOPSY N/A 10/13/2016   Procedure: SENTINEL NODE BIOPSY;  Surgeon: Nancy Marus, MD;  Location: [facility];  Service: Gynecology;  Laterality: N/A;   Family History Family History  Problem Relation Age of Onset  . Heart disease Mother   . Heart attack Mother   . Cancer Father   . Heart attack Father   . Prostate cancer Father 2       deceased 42  . Breast cancer Paternal Aunt 18       deceased 72s  . Prostate cancer Paternal Uncle 71       deceased 40  . Cancer Paternal Grandmother        unsure; deceased 38s  . Prostate cancer Brother 15       currently 3  . Prostate cancer Brother 84       currently 75    Social History Social History   Tobacco Use  . Smoking status: Never Smoker  . Smokeless tobacco: Never Used  Substance Use Topics  . Alcohol use: No  . Drug use: No   Allergies Omnipaque [iohexol], Calan [verapamil], Effexor [venlafaxine], Erythromycin, Femara [letrozole], Fosamax [alendronate], Ibuprofen, Paxil [paroxetine hcl], Remeron [mirtazapine], and Vasotec [enalapril]  Review of Systems Review of Systems All other systems are reviewed and are negative for acute change except as noted in the HPI  Physical Exam Vital Signs  I have reviewed the triage vital signs BP 137/79 (BP Location: Left Arm)   Pulse 85   Temp 98.9 F (37.2 C) (Oral)   Resp 16   SpO2 98%   Physical Exam Vitals signs reviewed.  Constitutional:       General: She is not in acute distress.    Appearance: She is well-developed. She is not diaphoretic.     Comments: Frail   HENT:     Head: Normocephalic and atraumatic.     Comments: Alopecia    Nose: Nose normal.  Eyes:     General: No scleral icterus.       Right eye: No discharge.        Left eye: No discharge.     Conjunctiva/sclera: Conjunctivae normal.     Pupils: Pupils are equal, round, and reactive to light.  Neck:     Musculoskeletal: Normal range of motion and neck supple.  Cardiovascular:     Rate and Rhythm: Normal rate and regular rhythm.     Heart sounds: No murmur. No friction rub. No gallop.   Pulmonary:     Effort: Pulmonary effort is normal. No respiratory distress.     Breath sounds: Normal breath sounds. No stridor. No rales.  Abdominal:     General: There is no distension.     Palpations: Abdomen is soft.     Tenderness: There is no abdominal tenderness.  Musculoskeletal:        General: No tenderness.     Right lower leg: 1+ Pitting Edema present.     Left lower leg: 1+ Pitting Edema present.  Skin:    General: Skin is warm and dry.     Findings: No erythema or rash.  Neurological:     Mental Status: She is alert and oriented to person, place, and time.     ED Results and Treatments Labs (all labs ordered are listed, but only abnormal results are displayed) Labs Reviewed  CBC WITH DIFFERENTIAL/PLATELET - Abnormal; Notable for the following components:  Result Value   Hemoglobin 10.2 (*)    HCT 32.9 (*)    MCH 25.1 (*)    RDW 21.2 (*)    Platelets 446 (*)    Lymphs Abs 0.6 (*)    All other components within normal limits  COMPREHENSIVE METABOLIC PANEL - Abnormal; Notable for the following components:   Potassium 3.4 (*)    Glucose, Bld 157 (*)    BUN 7 (*)    Total Protein 6.0 (*)    Albumin 2.7 (*)    AST 55 (*)    Alkaline Phosphatase 222 (*)    Total Bilirubin 1.3 (*)    All other components within normal limits  BRAIN  NATRIURETIC PEPTIDE - Abnormal; Notable for the following components:   B Natriuretic Peptide 333.1 (*)    All other components within normal limits  CBG MONITORING, ED - Abnormal; Notable for the following components:   Glucose-Capillary 134 (*)    All other components within normal limits                                                                                                                         EKG  EKG Interpretation  Date/Time:  Friday June 30 2019 01:17:21 EDT Ventricular Rate:  94 PR Interval:  130 QRS Duration: 70 QT Interval:  382 QTC Calculation: 477 R Axis:   -10 Text Interpretation:  Sinus rhythm with frequent Premature ventricular complexes Left ventricular hypertrophy Inferior infarct , age undetermined Abnormal ECG Confirmed by Addison Lank [phone]) on 06/30/2019 2:38:37 AM      Radiology Dg Chest 2 View  Result Date: 06/30/2019 CLINICAL DATA:  Weakness EXAM: CHEST - 2 VIEW COMPARISON:  05/22/2019 FINDINGS: Right Port-A-Cath remains in place, unchanged. Enlarging moderate to large right effusion. Right base atelectasis. Left lung clear. Heart is normal size. No acute bony abnormality. IMPRESSION: Moderate to large right pleural effusion, enlarging since prior study with right base atelectasis. Electronically Signed   By: Rolm Baptise M.D.   On: 06/30/2019 01:13    Pertinent labs & imaging results that were available during my care of the patient were reviewed by me and considered in my medical decision making (see chart for details).  Medications Ordered in ED Medications  sodium chloride 0.9 % bolus 1,000 mL (0 mLs Intravenous Stopped 06/30/19 0305)  Procedures Procedures  (including critical care time)  Medical Decision Making / ED Course I have reviewed the nursing notes for this encounter and the patient's prior records  (if available in EHR or on provided paperwork).   Tammy Hensley was evaluated in Emergency Department on 06/30/2019 for the symptoms described in the history of present illness. She was evaluated in the context of the global COVID-19 pandemic, which necessitated consideration that the patient might be at risk for infection with the SARS-CoV-2 virus that causes COVID-19. Institutional protocols and algorithms that pertain to the evaluation of patients at risk for COVID-19 are in a state of rapid change based on information released by regulatory bodies including the CDC and federal and state organizations. These policies and algorithms were followed during the patient's care in the ED.  CC: Generalized fatigue  Record review: Chemo treatment LUNG Docetaxel q21d ONCOLOGY TREATMENT 04/18/2019 - Present [name], MD   ONCOLOGY LINE FLUSH ORDERS & FERUMOXYTOL Shirlean Kelly) Q7D SUPPORTIVE THERAPY 10/03/2018 - Present [name], MD     Initial concern for: Failure to thrive Deconditioning Dehydration Also consider electrolyte derangements Renal insufficiency Symptomatic anemia  Low suspicion for: Infectious process Stroke   Work up as above, notable for:  Worsening right pleural effusion -likely secondary metastatic disease given prior imaging Orthostatics positive due to tachycardia. No significant anemia or leukocytosis/leukopenia.  No renal insufficiency No significant electrolyte derangements Mildly elevated BNP but not greater than 500  **Labs & imaging results that were available during my care of the patient were reviewed by me and considered in my medical decision making.    Management: Additional IV fluids  Reassessment:  Significant improvement/resolution of her generalized fatigue Patient was able to ambulate without the satting.  Reports that she feels good enough to be discharged home.  She has a follow-up appointment with oncology in the morning.  I will reach out  to Dr. Alvy Bimler, to inform her of the patient's visit and see if they can reschedule for the afternoon or Monday.       Final Clinical Impression(s) / ED Diagnoses Final diagnoses:  DOE (dyspnea on exertion)  Dehydration  Pleural effusion on right    The patient appears reasonably screened and/or stabilized for discharge and I doubt any other medical condition or other [facility] requiring further screening, evaluation, or treatment in the ED at this time prior to discharge.  Disposition: Discharge  Condition: Good  I have discussed the results, Dx and Tx plan with the patient who expressed understanding and agree(s) with the plan. Discharge instructions discussed at great length. The patient was given strict return precautions who verbalized understanding of the instructions. No further questions at time of discharge.    ED Discharge Orders    None        Follow Up: [name], MD [place] [phone]         This chart was dictated using voice recognition software.  Despite best efforts to proofread,  errors can occur which can change the documentation meaning.   Fatima Blank, MD 06/30/19 [phone]

## 2019-06-29 NOTE — ED Triage Notes (Signed)
Pt BIB [facility] from home, c/o weakness, loss of appetite, and increased drowsiness x 1 week. Pt has hx uterine cancer that has metastasized, she had her final chemo treatment last week. Pt given 450ccNS PTA. A&O x 4.

## 2019-06-30 ENCOUNTER — Emergency Department (HOSPITAL_COMMUNITY): Payer: Medicare Other

## 2019-06-30 ENCOUNTER — Inpatient Hospital Stay: Payer: Medicare Other

## 2019-06-30 ENCOUNTER — Inpatient Hospital Stay: Payer: Medicare Other | Admitting: Hematology and Oncology

## 2019-06-30 ENCOUNTER — Other Ambulatory Visit: Payer: Medicare Other

## 2019-06-30 ENCOUNTER — Other Ambulatory Visit: Payer: Self-pay | Admitting: Hematology and Oncology

## 2019-06-30 ENCOUNTER — Other Ambulatory Visit: Payer: Self-pay

## 2019-06-30 DIAGNOSIS — J90 Pleural effusion, not elsewhere classified: Secondary | ICD-10-CM

## 2019-06-30 DIAGNOSIS — Z5111 Encounter for antineoplastic chemotherapy: Secondary | ICD-10-CM | POA: Diagnosis not present

## 2019-06-30 DIAGNOSIS — C541 Malignant neoplasm of endometrium: Secondary | ICD-10-CM

## 2019-06-30 LAB — CBC WITH DIFFERENTIAL/PLATELET
Abs Immature Granulocytes: 0.04 10*3/uL (ref 0.00–0.07)
Basophils Absolute: 0 10*3/uL (ref 0.0–0.1)
Basophils Relative: 0 %
Eosinophils Absolute: 0 10*3/uL (ref 0.0–0.5)
Eosinophils Relative: 0 %
HCT: 32.9 % — ABNORMAL LOW (ref 36.0–46.0)
Hemoglobin: 10.2 g/dL — ABNORMAL LOW (ref 12.0–15.0)
Immature Granulocytes: 1 %
Lymphocytes Relative: 8 %
Lymphs Abs: 0.6 10*3/uL — ABNORMAL LOW (ref 0.7–4.0)
MCH: 25.1 pg — ABNORMAL LOW (ref 26.0–34.0)
MCHC: 31 g/dL (ref 30.0–36.0)
MCV: 80.8 fL (ref 80.0–100.0)
Monocytes Absolute: 0.4 10*3/uL (ref 0.1–1.0)
Monocytes Relative: 6 %
Neutro Abs: 5.7 10*3/uL (ref 1.7–7.7)
Neutrophils Relative %: 85 %
Platelets: 446 10*3/uL — ABNORMAL HIGH (ref 150–400)
RBC: 4.07 MIL/uL (ref 3.87–5.11)
RDW: 21.2 % — ABNORMAL HIGH (ref 11.5–15.5)
WBC: 6.8 10*3/uL (ref 4.0–10.5)
nRBC: 0 % (ref 0.0–0.2)

## 2019-06-30 LAB — COMPREHENSIVE METABOLIC PANEL
ALT: 27 U/L (ref 0–44)
AST: 55 U/L — ABNORMAL HIGH (ref 15–41)
Albumin: 2.7 g/dL — ABNORMAL LOW (ref 3.5–5.0)
Alkaline Phosphatase: 222 U/L — ABNORMAL HIGH (ref 38–126)
Anion gap: 13 (ref 5–15)
BUN: 7 mg/dL — ABNORMAL LOW (ref 8–23)
CO2: 26 mmol/L (ref 22–32)
Calcium: 9.3 mg/dL (ref 8.9–10.3)
Chloride: 98 mmol/L (ref 98–111)
Creatinine, Ser: 0.62 mg/dL (ref 0.44–1.00)
GFR calc Af Amer: >60 mL/min (ref >60)
GFR calc non Af Amer: >60 mL/min (ref >60)
Glucose, Bld: 157 mg/dL — ABNORMAL HIGH (ref 70–99)
Potassium: 3.4 mmol/L — ABNORMAL LOW (ref 3.5–5.1)
Sodium: 137 mmol/L (ref 135–145)
Total Bilirubin: 1.3 mg/dL — ABNORMAL HIGH (ref 0.3–1.2)
Total Protein: 6 g/dL — ABNORMAL LOW (ref 6.5–8.1)

## 2019-06-30 LAB — CBG MONITORING, ED: Glucose-Capillary: 134 mg/dL — ABNORMAL HIGH (ref 70–99)

## 2019-06-30 LAB — BRAIN NATRIURETIC PEPTIDE: B Natriuretic Peptide: 333.1 pg/mL — ABNORMAL HIGH (ref 0.0–100.0)

## 2019-06-30 NOTE — ED Notes (Signed)
Patient verbalizes understanding of discharge instructions. Opportunity for questioning and answers were provided. Armband removed by staff, pt discharged from ED in wheelchair.  

## 2019-06-30 NOTE — ED Notes (Signed)
Pt ambulated around the nurses station. O2 remained above 97%. Hr increased to 131. Towards the end she complained of being short of breath and winded.

## 2019-06-30 NOTE — ED Notes (Signed)
Told orange secretary the monitor in 47 isn't working. Will do ekg with push cart ekg machine.

## 2019-07-02 ENCOUNTER — Other Ambulatory Visit: Payer: Self-pay | Admitting: Internal Medicine

## 2019-07-02 DIAGNOSIS — E119 Type 2 diabetes mellitus without complications: Secondary | ICD-10-CM

## 2019-07-03 ENCOUNTER — Other Ambulatory Visit (HOSPITAL_COMMUNITY)
Admission: RE | Admit: 2019-07-03 | Discharge: 2019-07-03 | Disposition: A | Discharge status: Home / Self Care | Payer: Medicare Other | Source: Ambulatory Visit | Attending: Hematology and Oncology | Admitting: Hematology and Oncology

## 2019-07-03 ENCOUNTER — Ambulatory Visit (HOSPITAL_COMMUNITY)
Admission: RE | Admit: 2019-07-03 | Discharge: 2019-07-03 | Disposition: A | Discharge status: Home / Self Care | Payer: Medicare Other | Source: Ambulatory Visit | Attending: Hematology and Oncology | Admitting: Hematology and Oncology

## 2019-07-03 ENCOUNTER — Other Ambulatory Visit: Payer: Self-pay | Admitting: Hematology and Oncology

## 2019-07-03 ENCOUNTER — Other Ambulatory Visit: Payer: Self-pay

## 2019-07-03 ENCOUNTER — Inpatient Hospital Stay: Payer: Medicare Other

## 2019-07-03 ENCOUNTER — Inpatient Hospital Stay (HOSPITAL_BASED_OUTPATIENT_CLINIC_OR_DEPARTMENT_OTHER): Payer: Medicare Other | Admitting: Hematology and Oncology

## 2019-07-03 ENCOUNTER — Other Ambulatory Visit: Payer: Self-pay | Admitting: Medical

## 2019-07-03 VITALS — BP 89/68 | HR 107 | Temp 98.0°F | Resp 18 | Ht 62.0 in | Wt 148.0 lb

## 2019-07-03 DIAGNOSIS — J90 Pleural effusion, not elsewhere classified: Secondary | ICD-10-CM

## 2019-07-03 DIAGNOSIS — C787 Secondary malignant neoplasm of liver and intrahepatic bile duct: Secondary | ICD-10-CM

## 2019-07-03 DIAGNOSIS — C541 Malignant neoplasm of endometrium: Secondary | ICD-10-CM | POA: Insufficient documentation

## 2019-07-03 DIAGNOSIS — C7801 Secondary malignant neoplasm of right lung: Secondary | ICD-10-CM

## 2019-07-03 DIAGNOSIS — Z5111 Encounter for antineoplastic chemotherapy: Secondary | ICD-10-CM | POA: Diagnosis not present

## 2019-07-03 DIAGNOSIS — C7951 Secondary malignant neoplasm of bone: Secondary | ICD-10-CM

## 2019-07-03 DIAGNOSIS — T451X5A Adverse effect of antineoplastic and immunosuppressive drugs, initial encounter: Secondary | ICD-10-CM

## 2019-07-03 DIAGNOSIS — I10 Essential (primary) hypertension: Secondary | ICD-10-CM | POA: Diagnosis not present

## 2019-07-03 DIAGNOSIS — Z7189 Other specified counseling: Secondary | ICD-10-CM

## 2019-07-03 DIAGNOSIS — D6481 Anemia due to antineoplastic chemotherapy: Secondary | ICD-10-CM

## 2019-07-03 DIAGNOSIS — Z20828 Contact with and (suspected) exposure to other viral communicable diseases: Secondary | ICD-10-CM | POA: Insufficient documentation

## 2019-07-03 DIAGNOSIS — D61818 Other pancytopenia: Secondary | ICD-10-CM

## 2019-07-03 DIAGNOSIS — Z01812 Encounter for preprocedural laboratory examination: Secondary | ICD-10-CM | POA: Insufficient documentation

## 2019-07-03 LAB — CBC WITH DIFFERENTIAL (CANCER CENTER ONLY)
Abs Immature Granulocytes: 0.07 10*3/uL (ref 0.00–0.07)
Basophils Absolute: 0 10*3/uL (ref 0.0–0.1)
Basophils Relative: 0 %
Eosinophils Absolute: 0 10*3/uL (ref 0.0–0.5)
Eosinophils Relative: 0 %
HCT: 28.6 % — ABNORMAL LOW (ref 36.0–46.0)
Hemoglobin: 9.1 g/dL — ABNORMAL LOW (ref 12.0–15.0)
Immature Granulocytes: 1 %
Lymphocytes Relative: 3 %
Lymphs Abs: 0.3 10*3/uL — ABNORMAL LOW (ref 0.7–4.0)
MCH: 25.1 pg — ABNORMAL LOW (ref 26.0–34.0)
MCHC: 31.8 g/dL (ref 30.0–36.0)
MCV: 79 fL — ABNORMAL LOW (ref 80.0–100.0)
Monocytes Absolute: 0.5 10*3/uL (ref 0.1–1.0)
Monocytes Relative: 5 %
Neutro Abs: 8.7 10*3/uL — ABNORMAL HIGH (ref 1.7–7.7)
Neutrophils Relative %: 91 %
Platelet Count: 532 10*3/uL — ABNORMAL HIGH (ref 150–400)
RBC: 3.62 MIL/uL — ABNORMAL LOW (ref 3.87–5.11)
RDW: 21.1 % — ABNORMAL HIGH (ref 11.5–15.5)
WBC Count: 9.6 10*3/uL (ref 4.0–10.5)
nRBC: 0.4 % — ABNORMAL HIGH (ref 0.0–0.2)

## 2019-07-03 LAB — SAMPLE TO BLOOD BANK

## 2019-07-03 LAB — CMP (CANCER CENTER ONLY)
ALT: 23 U/L (ref 0–44)
AST: 48 U/L — ABNORMAL HIGH (ref 15–41)
Albumin: 2.7 g/dL — ABNORMAL LOW (ref 3.5–5.0)
Alkaline Phosphatase: 283 U/L — ABNORMAL HIGH (ref 38–126)
Anion gap: 13 (ref 5–15)
BUN: 12 mg/dL (ref 8–23)
CO2: 26 mmol/L (ref 22–32)
Calcium: 9.4 mg/dL (ref 8.9–10.3)
Chloride: 96 mmol/L — ABNORMAL LOW (ref 98–111)
Creatinine: 0.69 mg/dL (ref 0.44–1.00)
GFR, Est AFR Am: >60 mL/min (ref >60)
GFR, Estimated: >60 mL/min (ref >60)
Glucose, Bld: 142 mg/dL — ABNORMAL HIGH (ref 70–99)
Potassium: 3.6 mmol/L (ref 3.5–5.1)
Sodium: 135 mmol/L (ref 135–145)
Total Bilirubin: 0.7 mg/dL (ref 0.3–1.2)
Total Protein: 6 g/dL — ABNORMAL LOW (ref 6.5–8.1)

## 2019-07-03 LAB — SARS CORONAVIRUS 2 (TAT 6-24 HRS): SARS Coronavirus 2: NEGATIVE

## 2019-07-03 MED ORDER — SODIUM CHLORIDE 0.9% FLUSH
10.0000 mL | Freq: Once | INTRAVENOUS | Status: AC
  Administered 2019-07-03: 10 mL
  Filled 2019-07-03: qty 10

## 2019-07-03 MED ORDER — HEPARIN SOD (PORK) LOCK FLUSH 100 UNIT/ML IV SOLN
250.0000 [IU] | Freq: Once | INTRAVENOUS | Status: AC
  Administered 2019-07-03: 500 [IU]
  Filled 2019-07-03: qty 5

## 2019-07-03 MED ORDER — SODIUM CHLORIDE 0.9% FLUSH
10.0000 mL | Freq: Once | INTRAVENOUS | Status: AC
  Administered 2019-07-03: 10 mL via INTRAVENOUS
  Filled 2019-07-03: qty 10

## 2019-07-03 MED ORDER — SODIUM CHLORIDE 0.9 % IV SOLN
INTRAVENOUS | Status: AC
  Administered 2019-07-03: 12:00:00 via INTRAVENOUS
  Filled 2019-07-03 (×2): qty 250

## 2019-07-03 MED ORDER — PANTOPRAZOLE SODIUM 40 MG PO TBEC: 40.0000 mg | DELAYED_RELEASE_TABLET | Freq: Two times a day (BID) | ORAL | 0 refills | Status: AC

## 2019-07-03 MED ORDER — HEPARIN SOD (PORK) LOCK FLUSH 100 UNIT/ML IV SOLN
500.0000 [IU] | Freq: Once | INTRAVENOUS | Status: AC
  Administered 2019-07-03: 13:00:00 500 [IU] via INTRAVENOUS
  Filled 2019-07-03: qty 5

## 2019-07-03 NOTE — Patient Instructions (Signed)

## 2019-07-03 NOTE — Patient Instructions (Signed)

## 2019-07-04 ENCOUNTER — Ambulatory Visit (HOSPITAL_COMMUNITY)
Admission: RE | Admit: 2019-07-04 | Discharge: 2019-07-04 | Disposition: A | Discharge status: Home / Self Care | Payer: Medicare Other | Source: Ambulatory Visit | Attending: Hematology and Oncology | Admitting: Hematology and Oncology

## 2019-07-04 ENCOUNTER — Ambulatory Visit (HOSPITAL_COMMUNITY): Payer: Medicare Other

## 2019-07-04 ENCOUNTER — Encounter: Payer: Self-pay | Admitting: Hematology and Oncology

## 2019-07-04 ENCOUNTER — Other Ambulatory Visit: Payer: Self-pay | Admitting: Hematology and Oncology

## 2019-07-04 DIAGNOSIS — J90 Pleural effusion, not elsewhere classified: Secondary | ICD-10-CM | POA: Insufficient documentation

## 2019-07-04 HISTORY — PX: IR THORACENTESIS ASP PLEURAL SPACE W/IMG GUIDE: IMG5380

## 2019-07-04 MED ORDER — LIDOCAINE HCL 1 % IJ SOLN
INTRAMUSCULAR | Status: AC | PRN
  Administered 2019-07-04: 10 mL

## 2019-07-04 MED ORDER — LIDOCAINE HCL 1 % IJ SOLN
INTRAMUSCULAR | Status: AC
  Filled 2019-07-04: qty 20

## 2019-07-04 NOTE — Assessment & Plan Note (Signed)
She is profoundly weak and appears mildly dehydrated She also have shortness of breath on minimal exertion I have reviewed multiple chest x-rays On exam, she has moderate pleural effusion on the right I recommend urgent thoracentesis I will order fluid cytology to be sent for pathology Depending on test results, we will determine the next plan of care Overall, I felt that the patient has disease progression I briefly discussed my suspicion with her son Today, she does not need transfusion support but we will prescribe IV fluid support

## 2019-07-04 NOTE — Assessment & Plan Note (Signed)
She has elevated liver enzymes but this could be nonspecific in the presence of pleural effusion I recommend thoracentesis Observe only for now

## 2019-07-04 NOTE — Progress Notes (Signed)
[place] OFFICE PROGRESS NOTE  Patient Care Team: [name], MD as PCP - General (Internal Medicine) [name], MD as Consulting Physician (Gastroenterology) [name], MD as Consulting Physician (Ophthalmology) [name], [name], MD as Consulting Physician (Obstetrics and Gynecology) [name], MD as Consulting Physician (Hematology and Oncology) [name], MD as Consulting Physician (Radiation Oncology)  ASSESSMENT & PLAN:  Endometrial cancer [facility]) She is profoundly weak and appears mildly dehydrated She also have shortness of breath on minimal exertion I have reviewed multiple chest x-rays On exam, she has moderate pleural effusion on the right I recommend urgent thoracentesis I will order fluid cytology to be sent for pathology Depending on test results, we will determine the next plan of care Overall, I felt that the patient has disease progression I briefly discussed my suspicion with her son Today, she does not need transfusion support but we will prescribe IV fluid support  Pleural effusion on right Her pleural effusion is new compared to previous imaging I suspect this could be malignant Due to symptoms of shortness of breath, I recommend therapeutic and diagnostic thoracentesis She agreed with the plan of care  Hypertension She has history of hypertension Currently, she is hypotensive I recommend gentle hydration with half a liter of IV fluid and she agreed to proceed  Pancytopenia, acquired ([place]) She had intermittent anemia in the past She does not need transfusion today  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[facility]) She has elevated liver enzymes but this could be nonspecific in the presence of pleural effusion I recommend thoracentesis Observe only for now  Goals of care, counseling/discussion I had numerous goals of care discussion with the patient and her son I reviewed the plan of care with her son again I am concerned about disease  progression For now, I will wait for fluid cytology from thoracentesis If the result is positive, this would represent disease progression Due to her frail status, it is not clear to me that the patient could undergo further palliative chemotherapy I will schedule return appointment as soon as I have results back for next step   Orders Placed This Encounter  Procedures  . IR Paracentesis    Standing Status:   Future    Standing Expiration Date:   09/01/2020    Order Specific Question:   If therapeutic, is there a maximum amount of fluid to be removed?    Answer:   No    Order Specific Question:   Are labs required for specimen collection?    Answer:   Yes    Order Specific Question:   Lab orders requested (DO NOT place separate lab orders, these will be automatically ordered during procedure specimen collection):    Answer:   Cytology - Non Pap    Order Specific Question:   Is Albumin medication needed?    Answer:   No    Order Specific Question:   Reason for Exam (SYMPTOM  OR DIAGNOSIS REQUIRED)    Answer:   suspect malignant effusion    Order Specific Question:   Preferred Imaging Location?    Answer:   [facility]  . IR THORACENTESIS ASP PLEURAL SPACE W/IMG GUIDE    Standing Status:   Future    Standing Expiration Date:   09/02/2020    Order Specific Question:   Are labs required for specimen collection?    Answer:   Yes    Order Specific Question:   Lab orders requested (DO NOT place separate lab orders,  these will be automatically ordered during procedure specimen collection):    Answer:   Cytology - Non Pap    Order Specific Question:   Reason for Exam (SYMPTOM  OR DIAGNOSIS REQUIRED)    Answer:   malignant effusion    Order Specific Question:   Preferred Imaging Location?    Answer:   [facility]    INTERVAL HISTORY: Please see below for problem oriented charting. She returns for further follow-up She went to the emergency department with  dehydration She has vague intermittent abdominal pain Her oral intake is poor She has shortness of breath on minimum exertion She denies nausea or constipation.  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 SEE COMMENT.  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 The greatest extent of invasive carcinoma as measured on the needle core biopsy measures 0.6 cm.     Endometrial cancer ([place])  02/03/2002 Pathology Results   1. BENIGN ENDOMETRIAL POLYPS.  2. UTERINE FIBROIDS: PORTIONS OF SMOOTH MUSCLE CONSISTENT WITH BENIGN LEIOMYOMA(S). PORTIONS OF BENIGN, CYSTICALLY ATROPHIC ENDOMETRIUM WITHOUT HYPERPLASIA OR EVIDENCE OF MALIGNANCY. 3. ENDOMETRIAL CURETTAGE: BENIGN ENDOMETRIUM AND SMOOTH MUSCLE.   09/07/2016 Pathology Results   PAP smear positive for malignant cells   09/07/2016 Initial Diagnosis   Patient had postmenopausal bleeding in 08-2016. She had PAP 09-07-16 by PCP Dr [name], which documented carcinoma. She had evaluation by Dr [name] with colposcopy/ ECC/endometrial biopsy documenting serous endometrioid endometrial carcinoma.   09/15/2016 Imaging   Ct imaging showed markedly thickened and heterogeneous endometrial stripe suspicious for endometrial carcinoma in this patient with postmenopausal vaginal bleeding. Cystic lesion in the right ovary cannot be definitively characterized. Pelvic ultrasound is recommended for further evaluation. Aortoiliac atherosclerosis. Avascular necrosis of the femoral heads bilaterally.    10/07/2016 Tumor Marker   Patient's tumor was tested for the following markers: CA125 Results of the tumor marker test revealed 15.1   10/13/2016 Pathology Results   1. Lymph node, sentinel, biopsy, right obturator - THERE IS NO EVIDENCE OF CARCINOMA IN 8 OF 8 LYMPH NODES (0/8). - SEE COMMENT. 2. Lymph node, sentinel, biopsy, left obturator - THERE IS NO EVIDENCE OF CARCINOMA IN 1 OF 1 LYMPH NODE (0/1). - SEE COMMENT. 3. Lymph node, sentinel, biopsy, left para-aortic - THERE IS NO EVIDENCE OF CARCINOMA IN 4 OF 4 LYMPH NODES (0/4). - SEE COMMENT. 4. Uterus +/- tubes/ovaries, neoplastic, cervix - INVASIVE MIXED SEROUS/ENDOMETRIOID ADENOCARCINOMA, MULTIPLE FOCI, FIGO GRADE III, THE LARGEST FOCUS SPANS 8.2 CM. - ADENOCARCINOMA EXTENDS INTO THE OUTER HALF OF THE MYOMETRIUM AND INVOLVES THE STROMA OF THE LOWER UTERINE SEGMENT AND CERVIX. - LYMPHOVASCULAR INVASION IS IDENTIFIED. - THE SURGICAL RESECTION MARGINS ARE NEGATIVE FOR CARCINOMA. - SEE ONCOLOGY TABLE BELOW. ADDITIONAL FINDINGS: - ENDOMETRIUM: ENDOMETRIOID TYPE POLYP(S), WHICH CONTAIN FOCI OF SIMILAR APPEARING MIXED SEROUS/ENDOMETRIOID ADENOCARCINOMA. - MYOMETRIUM: LEIOMYOMATA. - SEROSA: UNREMARKABLE. - BILATERAL ADNEXA: BENIGN OVARIES AND FALLOPIAN TUBES.   10/13/2016 Surgery   Surgery: Total robotic hysterectomy bilateral salpingo-oophorectomy, bilateral pelvic sentinel lymph node removal, left PA sentinel lymph node removal. Mini-laparotomy for specimen removal  Surgeons:  Lucita Lora. Alycia Rossetti, MD; [name], MD  Pathology:  1)Uterus, cervix, bilateral tubes and ovaries 2) Right obturator SLN 3) Left Obturator SLN 4) Left PA SLN Operative findings: 14 week fibroid uterus with one large dominant 6 cm myoma that was palpably calcified. 4 cm right ovarian cyst. + SLN identified in the bilateral obturator spaces, + SLN identified in the left PA space.    12/01/2016 - 05/31/2017 Chemotherapy   She received carboplatin &  Taxol. She had 3 cycles of chemo followed by radiation and then 3 more cycles of chemo   01/11/2017 Genetic Testing   Testing was normal and did not reveal a mutation in these genes: The genes tested were the 80 genes on [employer]'s Multi-Cancer panel (ALK, APC, ATM, AXIN2, BAP1, BARD1, BLM, BMPR1A, BRCA1, BRCA2, BRIP1, CASR, CDC73, CDH1, CDK4, CDKN1B, CDKN1C, CDKN2A, CEBPA, CHEK2, DICER1, DIS3L2, EGFR, EPCAM, FH, FLCN, GATA2, GPC3, GREM1, HOXB13,  HRAS, KIT, MAX, MEN1, MET, MITF, MLH1, MSH2, MSH6, MUTYH, NBN, NF1, NF2, PALB2, PDGFRA, PHOX2B, PMS2, POLD1, POLE, POT1, PRKAR1A, PTCH1, PTEN, RAD50, RAD51C, RAD51D, RB1, RECQL4, RET, RUNX1, SDHA, SDHAF2, SDHB, SDHC, SDHD, SMAD4, SMARCA4, SMARCB1, SMARCE1, STK11, SUFU, TERC, TERT, TMEM127, TP53, TSC1, TSC2, VHL, WRN, and WT1).   02/10/2017 - 04/12/2017 Radiation Therapy   02/10/17-03/16/17; 03/29/17-04/12/17;  1) Pelvis/ 45 Gy in 25 fractions  2) Vaginal Cuff/ 18 Gy in 3 fractions   08/26/2018 Imaging   Large destructive mass lesion left iliac bone with large soft tissue mass extending into the posterior soft tissues and pelvis compatible with metastatic disease. Consider follow-up CT chest abdomen pelvis with contrast for further staging.  Radiation changes at L5 and the sacrum. No lumbar metastatic deposits  Multilevel degenerative changes above.   08/29/2018 Imaging   1. 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 2. New irregular nodular opacity in the medial right middle lobe, new since 2017 CT. Separate subcentimeter right upper lobe solid pulmonary nodule, for which no comparison exists. These findings are indeterminate for pulmonary metastases and attention on follow-up chest CT in 3 months is recommended. 3. No lymphadenopathy or additional findings of metastatic disease.No tumor recurrence at the hysterectomy margin. 4. Aortic Atherosclerosis (ICD10-I70.0). Additional chronic findings as detailed.    08/29/2018 Tumor Marker   Patient's tumor was tested for the following markers: CA-125 Results of the tumor marker test revealed 17.4   09/06/2018 Procedure   CT-guided core biopsy performed of huge mass in the left iliac fossa destroying the left iliac bone.   09/06/2018 Pathology Results   Soft Tissue Needle Core Biopsy, Left Iliac Fossa - POORLY DIFFERENTIATED MALIGNANT NEOPLASM. Microscopic Comment The provided clinical history of both endometrial and breast cancer is noted. In the current case, immunohistochemistry for qualitative ER demonstrates scattered positive staining. CK8/18 is focally positive. CKAE1/3, CK7, CK20, TTF-1, CDX-2, GATA3 and PAX 8 are negative. The immunophenotype is not specific as to an origin for this poorly differentiated malignant neoplasm. The morphology is more suggestive of endometrial cancer than breast cancer, and may represent an unrecognized carcinosarcomatous component.   09/08/2018 - 09/27/2018 Radiation Therapy   She had palliative radiation treatment   09/29/2018 Procedure   Placement of single lumen port a cath via right internal jugular vein. The catheter tip lies at the cavo-atrial junction. A power injectable port a cath was placed and is ready for immediate use.   10/04/2018 - 01/17/2019 Chemotherapy   The patient had carboplatin and taxol x 6 cycles  12/05/2018 Tumor Marker   Patient's tumor was tested for the following markers: CA125 Results of the tumor marker test revealed 5.5   12/05/2018 Imaging   1. Considerable cystic degeneration in the large destructive mass of the left iliac bone. New or progressive left iliac bone pathologic fracture extending into the left SI joint, along with abnormal subluxation at the pubic symphysis indicating pubic symphysis instability. 2. Previous pleural-based nodularity in the right middle lobe is markedly improved, currently 4 mm in thickness and previously 11 mm in thickness. 3. Indistinct new  wedge-shaped lesion in the left mid kidney, hypodense on portal venous phase images and hyperdense on delayed phase images, suggesting prolong tubular transit. This is a nonspecific finding but could reflect local inflammation, infection, or less likely infiltrative tumor. 4. Other imaging findings of potential clinical significance: Aortic Atherosclerosis (ICD10-I70.0). Left main coronary artery atherosclerosis. 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   02/15/2019 Imaging   1. Slight progression of large expansile lytic lesion involving the left iliac bone. This demonstrates central low density, suggesting partial necrosis. This lesion was biopsied on 09/06/2018, revealing poorly differentiated malignant neoplasm. 2. New low-density lesion in the dome of the right hepatic lobe worrisome for cystic or treated metastasis. 3. No other evidence of metastatic disease. No explanation for the patient's symptoms. 4. 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    03/03/2019 - 04/07/2019 Anti-estrogen oral therapy   She cannot tolerate tamoxifen and megace   03/23/2019 Tumor Marker   Patient's tumor was tested for the following markers: CA125 Results of the tumor marker test revealed 3.8   04/13/2019 Imaging   1. 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 2. Mild decrease in size of large lytic bone metastasis and associated soft tissue mass involving the left ilium. 3. Unstable pathologic fracture of the left ilium, without significant change since prior exam. 4. New right lower lobe pulmonary metastases.     04/24/2019 -  Chemotherapy   The patient had DOCEtaxel (TAXOTERE) 130 mg in sodium chloride 0.9 % 250 mL chemo infusion, 75 mg/m2 = 130 mg, Intravenous,  Once, 4 of 4 cycles Dose modification: 50 mg/m2 (66.7 % of original dose 75 mg/m2,  Cycle 2, Reason: Dose Not Tolerated), 45 mg/m2 (60 % of original dose 75 mg/m2, Cycle 2, Reason: Dose Not Tolerated), 25 mg/m2 (33.3 % of original dose 75 mg/m2, Cycle 3, Reason: Dose Not Tolerated) Administration: 130 mg (04/24/2019), 80 mg (05/15/2019), 40 mg (06/05/2019), 40 mg (06/26/2019)  for chemotherapy treatment.    05/01/2019 - 05/03/2019 Hospital Admission   She was admitted to the hospital for evaluation of fever and weakness   05/22/2019 - 05/26/2019 Hospital Admission   She was admitted to the hospital with recurrent neutropenic fever with no source found   05/26/2019 Imaging   1. Enlarging large necrotic hepatic mass. 2. 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 3. New small right pleural effusion and bibasilar atelectasis   Metastasis to bone ([place])  04/24/2019 -  Chemotherapy   The patient had DOCEtaxel (TAXOTERE) 130 mg in sodium chloride 0.9 % 250 mL chemo infusion, 75 mg/m2 = 130 mg, Intravenous,  Once, 4 of 4 cycles Dose modification: 50 mg/m2 (66.7 % of original dose 75 mg/m2, Cycle 2, Reason: Dose Not Tolerated), 45  mg/m2 (60 % of original dose 75 mg/m2, Cycle 2, Reason: Dose Not Tolerated), 25 mg/m2 (33.3 % of original dose 75 mg/m2, Cycle 3, Reason: Dose Not Tolerated) Administration: 130 mg (04/24/2019), 80 mg (05/15/2019), 40 mg (06/05/2019), 40 mg (06/26/2019)  for chemotherapy treatment.    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([place])  02/16/2019 Initial Diagnosis   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([place])   04/24/2019 -  Chemotherapy   The patient had DOCEtaxel (TAXOTERE) 130 mg in sodium chloride 0.9 % 250 mL chemo infusion, 75 mg/m2 = 130 mg, Intravenous,  Once, 4 of 4 cycles Dose modification: 50 mg/m2 (66.7 % of original dose 75 mg/m2, Cycle 2, Reason: Dose Not Tolerated), 45 mg/m2 (60 % of original dose 75 mg/m2, Cycle 2, Reason: Dose Not Tolerated), 25 mg/m2 (33.3 % of original dose 75 mg/m2, Cycle 3, Reason:  Dose Not Tolerated) Administration: 130 mg (04/24/2019), 80 mg (05/15/2019), 40 mg (06/05/2019), 40 mg (06/26/2019)  for chemotherapy treatment.      REVIEW OF SYSTEMS:   Constitutional: Denies fevers, chills or abnormal weight loss Eyes: Denies blurriness of vision Ears, nose, mouth, throat, and face: Denies mucositis or sore throat Respiratory: Denies cough, dyspnea or wheezes Cardiovascular: Denies palpitation, chest discomfort or lower extremity swelling Gastrointestinal:  Denies nausea, heartburn or change in bowel habits Skin: Denies abnormal skin rashes Lymphatics: Denies new lymphadenopathy or easy bruising Neurological:Denies numbness, tingling or new weaknesses Behavioral/Psych: Mood is stable, no new changes  All other systems were reviewed with the patient and are negative.  I have reviewed the past medical history, past surgical history, social history and family history with the patient and they are unchanged from previous note.  ALLERGIES:  is allergic to omnipaque [iohexol]; calan [verapamil]; effexor [venlafaxine]; erythromycin; femara [letrozole]; fosamax [alendronate]; ibuprofen; paxil [paroxetine hcl]; remeron [mirtazapine]; and vasotec [enalapril].  MEDICATIONS:  Current Outpatient Medications  Medication Sig Dispense Refill  . Calcium Carbonate-Vitamin D (CALCIUM 600 + D PO) Take 1 tablet by mouth 2 (two) times daily.     . Cholecalciferol (VITAMIN D) 2000 units CAPS Take 2,000 Units by mouth every other day.    Marland Kitchen dexamethasone (DECADRON) 4 MG tablet Take 2 mg on Mondays and Thursdays only (Patient taking differently: Take 2 mg by mouth See admin instructions. on Mondays and Thursdays only) 60 tablet 0  . furosemide (LASIX) 20 MG tablet Take 1 tablet (20 mg total) by mouth daily as needed for fluid or edema. 30 tablet 1  . HYDROcodone-acetaminophen (NORCO/VICODIN) 5-325 MG tablet Take 1-2 tablets by mouth every 6 (six) hours as needed for severe pain. 30 tablet 0  .  levothyroxine (SYNTHROID) 88 MCG tablet Take 1 tablet daily on an empty stomach with only water for 30 minutes & no Antacid meds, Calcium or Magnesium for 4 hours & avoid Biotin (Patient taking differently: Take 88 mcg by mouth daily before breakfast. ) 90 tablet 3  . metFORMIN (GLUCOPHAGE-XR) 500 MG 24 hr tablet Take 2 tablets 2 x /day with Meals for Diabetes 360 tablet 3  . Multiple Vitamin (MULTIVITAMIN WITH MINERALS) TABS tablet Take 1 tablet by mouth daily.    . pantoprazole (PROTONIX) 40 MG tablet Take 1 tablet (40 mg total) by mouth 2 (two) times daily. 60 tablet 0  . polyethylene glycol (MIRALAX / GLYCOLAX) 17 g packet Take 17 g by mouth daily as needed for moderate constipation. 30 each 0  . pravastatin (PRAVACHOL) 40 MG tablet TAKE 1 TABLET BY MOUTH ONCE DAILY IN THE EVENING (Patient taking  differently: Take 40 mg by mouth at bedtime. ) 90 tablet 0  . prochlorperazine (COMPAZINE) 10 MG tablet Take 1 tablet (10 mg total) by mouth every 6 (six) hours as needed for nausea or vomiting. 30 tablet 1  . traMADol (ULTRAM) 50 MG tablet Take 1 tablet (50 mg total) by mouth every 6 (six) hours as needed for moderate pain. 90 tablet 0  . traZODone (DESYREL) 100 MG tablet TAKE 1 TABLET BY MOUTH AT BEDTIME (Patient taking differently: Take 100 mg by mouth at bedtime. ) 30 tablet 0   No current facility-administered medications for this visit.    Facility-Administered Medications Ordered in Other Visits  Medication Dose Route Frequency Provider Last Rate Last Dose  . sodium chloride flush (NS) 0.9 % injection 10 mL  10 mL Intracatheter PRN [name], MD   10 mL at 12/27/18 1825    PHYSICAL EXAMINATION: ECOG PERFORMANCE STATUS: 2 - Symptomatic, <50% confined to bed  Vitals:   07/03/19 1140  BP: (!) 89/68  Pulse: (!) 107  Resp: 18  Temp: 98 F (36.7 C)  SpO2: 99%   Filed Weights   07/03/19 1140  Weight: 148 lb (67.1 kg)    GENERAL:alert, no distress and comfortable SKIN: skin color,  texture, turgor are normal, no rashes or significant lesions EYES: normal, Conjunctiva are pink and non-injected, sclera clear OROPHARYNX:no exudate, no erythema and lips, buccal mucosa, and tongue normal  NECK: supple, thyroid normal size, non-tender, without nodularity LYMPH:  no palpable lymphadenopathy in the cervical, axillary or inguinal LUNGS: clear to auscultation and percussion with normal breathing effort HEART: regular rate & rhythm and no murmurs and no lower extremity edema ABDOMEN:abdomen soft, non-tender and normal bowel sounds Musculoskeletal:no cyanosis of digits and no clubbing  NEURO: alert & oriented x 3 with fluent speech, no focal motor/sensory deficits  LABORATORY DATA:  I have reviewed the data as listed    Component Value Date/Time   NA 135 07/03/2019 1105   NA 137 05/31/2017 0931   K 3.6 07/03/2019 1105   K 3.5 05/31/2017 0931   CL 96 (L) 07/03/2019 1105   CL 99 12/20/2012 1336   CO2 26 07/03/2019 1105   CO2 25 05/31/2017 0931   GLUCOSE 142 (H) 07/03/2019 1105   GLUCOSE 161 (H) 05/31/2017 0931   GLUCOSE 110 (H) 12/20/2012 1336   BUN 12 07/03/2019 1105   BUN 10.9 05/31/2017 0931   CREATININE 0.69 07/03/2019 1105   CREATININE 0.63 07/05/2018 1009   CREATININE 0.8 05/31/2017 0931   CALCIUM 9.4 07/03/2019 1105   CALCIUM 10.6 (H) 05/31/2017 0931   PROT 6.0 (L) 07/03/2019 1105   PROT 7.5 05/31/2017 0931   ALBUMIN 2.7 (L) 07/03/2019 1105   ALBUMIN 4.2 05/31/2017 0931   AST 48 (H) 07/03/2019 1105   AST 16 05/31/2017 0931   ALT 23 07/03/2019 1105   ALT 15 05/31/2017 0931   ALKPHOS 283 (H) 07/03/2019 1105   ALKPHOS 91 05/31/2017 0931   BILITOT 0.7 07/03/2019 1105   BILITOT 0.33 05/31/2017 0931   GFRNONAA >60 07/03/2019 1105   GFRNONAA 87 07/05/2018 1009   GFRAA >60 07/03/2019 1105   GFRAA 100 07/05/2018 1009    No results found for: SPEP, UPEP  Lab Results  Component Value Date   WBC 9.6 07/03/2019   NEUTROABS 8.7 (H) 07/03/2019   HGB 9.1 (L)  07/03/2019   HCT 28.6 (L) 07/03/2019   MCV 79.0 (L) 07/03/2019   PLT 532 (H) 07/03/2019  Chemistry      Component Value Date/Time   NA 135 07/03/2019 1105   NA 137 05/31/2017 0931   K 3.6 07/03/2019 1105   K 3.5 05/31/2017 0931   CL 96 (L) 07/03/2019 1105   CL 99 12/20/2012 1336   CO2 26 07/03/2019 1105   CO2 25 05/31/2017 0931   BUN 12 07/03/2019 1105   BUN 10.9 05/31/2017 0931   CREATININE 0.69 07/03/2019 1105   CREATININE 0.63 07/05/2018 1009   CREATININE 0.8 05/31/2017 0931      Component Value Date/Time   CALCIUM 9.4 07/03/2019 1105   CALCIUM 10.6 (H) 05/31/2017 0931   ALKPHOS 283 (H) 07/03/2019 1105   ALKPHOS 91 05/31/2017 0931   AST 48 (H) 07/03/2019 1105   AST 16 05/31/2017 0931   ALT 23 07/03/2019 1105   ALT 15 05/31/2017 0931   BILITOT 0.7 07/03/2019 1105   BILITOT 0.33 05/31/2017 0931       RADIOGRAPHIC STUDIES: I have personally reviewed the radiological images as listed and agreed with the findings in the report. Dg Chest 2 View  Result Date: 07/03/2019 CLINICAL DATA:  Endometrial cancer with pleural effusion EXAM: CHEST - 2 VIEW COMPARISON:  Three days ago Findings: Unchanged moderate right pleural effusion obscuring the right lower lobe. Normal heart size and mediastinal contours. Porta catheter with tip at the SVC. Rounded density over the left anterior first rib. IMPRESSION: 1. Moderate right pleural effusion without change from 3 days ago. 2. Rounded density over the left upper chest which could be pulmonary nodule or first rib lesion. Electronically Signed   By: Monte Fantasia M.D.   On: 07/03/2019 10:34   Dg Chest 2 View  Result Date: 06/30/2019 CLINICAL DATA:  Weakness EXAM: CHEST - 2 VIEW COMPARISON:  05/22/2019 FINDINGS: Right Port-A-Cath remains in place, unchanged. Enlarging moderate to large right effusion. Right base atelectasis. Left lung clear. Heart is normal size. No acute bony abnormality. IMPRESSION: Moderate to large right pleural  effusion, enlarging since prior study with right base atelectasis. Electronically Signed   By: Rolm Baptise M.D.   On: 06/30/2019 01:13    All questions were answered. The patient knows to call the clinic with any problems, questions or concerns. No barriers to learning was detected.  I spent 30 minutes counseling the patient face to face. The total time spent in the appointment was 40 minutes and more than 50% was on counseling and review of test results  [name], MD 07/04/2019 8:40 AM

## 2019-07-04 NOTE — Assessment & Plan Note (Signed)
She had intermittent anemia in the past She does not need transfusion today

## 2019-07-04 NOTE — Assessment & Plan Note (Signed)
I had numerous goals of care discussion with the patient and her son I reviewed the plan of care with her son again I am concerned about disease progression For now, I will wait for fluid cytology from thoracentesis If the result is positive, this would represent disease progression Due to her frail status, it is not clear to me that the patient could undergo further palliative chemotherapy I will schedule return appointment as soon as I have results back for next step

## 2019-07-04 NOTE — Assessment & Plan Note (Signed)
Her pleural effusion is new compared to previous imaging I suspect this could be malignant Due to symptoms of shortness of breath, I recommend therapeutic and diagnostic thoracentesis She agreed with the plan of care

## 2019-07-04 NOTE — Assessment & Plan Note (Signed)
She has history of hypertension Currently, she is hypotensive I recommend gentle hydration with half a liter of IV fluid and she agreed to proceed

## 2019-07-04 NOTE — Procedures (Signed)
PROCEDURE SUMMARY:  Successful image-guided right thoracentesis. Yielded 350 milliliters of golden fluid. Patient tolerated procedure well. EBL: Zero No immediate complications.  Specimen was sent for labs.  Post procedure CXR shows no pneumothorax.  Please see imaging section of Epic for full dictation.  Joaquim Nam PA-C 07/04/2019 10:46 AM

## 2019-07-10 ENCOUNTER — Telehealth: Payer: Self-pay | Admitting: Oncology

## 2019-07-10 ENCOUNTER — Other Ambulatory Visit: Payer: Self-pay

## 2019-07-10 ENCOUNTER — Inpatient Hospital Stay (HOSPITAL_BASED_OUTPATIENT_CLINIC_OR_DEPARTMENT_OTHER): Payer: Medicare Other | Admitting: Hematology and Oncology

## 2019-07-10 VITALS — BP 124/67 | HR 99 | Temp 98.0°F | Resp 16 | Ht 62.0 in

## 2019-07-10 DIAGNOSIS — G893 Neoplasm related pain (acute) (chronic): Secondary | ICD-10-CM | POA: Diagnosis not present

## 2019-07-10 DIAGNOSIS — Z7189 Other specified counseling: Secondary | ICD-10-CM

## 2019-07-10 DIAGNOSIS — C7801 Secondary malignant neoplasm of right lung: Secondary | ICD-10-CM | POA: Diagnosis not present

## 2019-07-10 DIAGNOSIS — C541 Malignant neoplasm of endometrium: Secondary | ICD-10-CM

## 2019-07-10 DIAGNOSIS — Z5111 Encounter for antineoplastic chemotherapy: Secondary | ICD-10-CM | POA: Diagnosis not present

## 2019-07-10 DIAGNOSIS — C7951 Secondary malignant neoplasm of bone: Secondary | ICD-10-CM

## 2019-07-10 MED ORDER — HYDROCODONE-ACETAMINOPHEN 5-325 MG PO TABS: 1.0000 | ORAL_TABLET | ORAL | 0 refills | Status: AC | PRN

## 2019-07-10 MED ORDER — OXYCODONE-ACETAMINOPHEN 5-325 MG PO TABS
1.0000 | ORAL_TABLET | Freq: Once | ORAL | Status: AC
  Administered 2019-07-10: 1 via ORAL

## 2019-07-10 MED ORDER — OXYCODONE-ACETAMINOPHEN 5-325 MG PO TABS
ORAL_TABLET | ORAL | Status: AC
  Filled 2019-07-10: qty 1

## 2019-07-10 NOTE — Telephone Encounter (Signed)
Winferd Humphrey (patient's husband) called and said Tammy Hensley has been in a lot of pain this weekend in her back and stomach.  He tried to hold off calling because she has an appointment today and wanted to let Dr. Alvy Bimler know.  He said she took a tramadol last night and again this morning. He also said she is not eating.  She did have a BM last night.    He also would like a list detailing when she should take her medications. He said their son puts them in a container for her but they would like a list so they understand when she should take each pill.  Called patient's son, Tammy Hensley and advised him that she should go to the ER if she is in severe pain.  He is going to call her and call us back.  Tammy Hensley called back and said he called his parents and she is in moderate pain. He said his dad does not want to take her to the ER and wants to keep her appointment with Dr. Alvy Bimler today.

## 2019-07-11 ENCOUNTER — Encounter: Payer: Self-pay | Admitting: Hematology and Oncology

## 2019-07-11 NOTE — Assessment & Plan Note (Signed)
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

## 2019-07-11 NOTE — Progress Notes (Signed)
Tammy Hensley  Patient Care Team: Tammy Pinto, MD as PCP - General (Internal Medicine) Tammy Campbell, MD as Consulting Physician (Gastroenterology) Tammy Peach, MD as Consulting Physician (Ophthalmology) Tammy Hensley, [name], MD as Consulting Physician (Obstetrics and Gynecology) [name], MD as Consulting Physician (Hematology and Oncology) Tammy Pray, MD as Consulting Physician (Radiation Oncology)  ASSESSMENT & PLAN:  Endometrial cancer Tammy Hensley) The patient is getting weaker Recent thoracentesis failed to demonstrate malignant cells present in pleural fluid but given her deterioration of performance status, I suspect she has progression of disease At this point in time, I do not believe ordering imaging study is helpful The patient has made informed decision not to pursue further chemotherapy and would like to focus on palliative care which I believe is reasonable I will cancel future follow-up and make referral for palliative care/hospice  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 has worsening right upper quadrant pain Tramadol is not helpful The patient has improved pain control with oxycodone given in the clinic today I recommend narcotic prescription to control her pain and she agreed to proceed I have prescribed and refilled her hydrocodone  Goals of care, counseling/discussion I had significant goals of care discussion with the patient and her son The patient agreed to DNR I given her Hensley DNR order We discussed referral to home-based palliative care/hospice and she agreed We discussed prognosis is likely less than 6 months   No orders of the defined types were placed in this encounter.   INTERVAL HISTORY: Please see below for problem oriented charting. 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 SEE COMMENT.  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 The greatest extent of invasive carcinoma as measured on the needle core biopsy measures 0.6 cm.     Endometrial cancer (Tammy Hensley)  02/03/2002 Pathology Results   1. BENIGN ENDOMETRIAL POLYPS.  2. UTERINE FIBROIDS: PORTIONS OF SMOOTH MUSCLE CONSISTENT WITH BENIGN LEIOMYOMA(S). PORTIONS OF BENIGN, CYSTICALLY ATROPHIC ENDOMETRIUM WITHOUT HYPERPLASIA OR EVIDENCE OF MALIGNANCY. 3. ENDOMETRIAL CURETTAGE: BENIGN ENDOMETRIUM AND SMOOTH MUSCLE.   09/07/2016 Pathology Results   PAP smear positive for malignant cells   09/07/2016 Initial Diagnosis   Patient had postmenopausal bleeding in 08-2016. She had PAP 09-07-16 by PCP Dr Tammy Hensley, which documented carcinoma. She had evaluation by Dr Tammy Hensley with colposcopy/ ECC/endometrial biopsy documenting serous endometrioid endometrial carcinoma.   09/15/2016 Imaging   Ct imaging showed markedly thickened and heterogeneous endometrial stripe suspicious for endometrial carcinoma in this patient with postmenopausal vaginal bleeding. Cystic lesion in the right ovary cannot be definitively characterized. Pelvic ultrasound is recommended for further evaluation. Aortoiliac atherosclerosis. Avascular necrosis of the femoral heads bilaterally.   10/07/2016 Tumor Marker    Patient's tumor was tested for the following markers: CA125 Results of  the tumor marker test revealed 15.1   10/13/2016 Pathology Results   1. Lymph node, sentinel, biopsy, right obturator - THERE IS NO EVIDENCE OF CARCINOMA IN 8 OF 8 LYMPH NODES (0/8). - SEE COMMENT. 2. Lymph node, sentinel, biopsy, left obturator - THERE IS NO EVIDENCE OF CARCINOMA IN 1 OF 1 LYMPH NODE (0/1). - SEE COMMENT. 3. Lymph node, sentinel, biopsy, left para-aortic - THERE IS NO EVIDENCE OF CARCINOMA IN 4 OF 4 LYMPH NODES (0/4). - SEE COMMENT. 4. Uterus +/- tubes/ovaries, neoplastic, cervix - INVASIVE MIXED SEROUS/ENDOMETRIOID ADENOCARCINOMA, MULTIPLE FOCI, FIGO GRADE III, THE LARGEST FOCUS SPANS 8.2 CM. - ADENOCARCINOMA EXTENDS INTO THE OUTER HALF OF THE MYOMETRIUM AND INVOLVES THE STROMA OF THE LOWER UTERINE SEGMENT AND CERVIX. - LYMPHOVASCULAR INVASION IS IDENTIFIED. - THE SURGICAL RESECTION MARGINS ARE NEGATIVE FOR CARCINOMA. - SEE ONCOLOGY TABLE BELOW. ADDITIONAL FINDINGS: - ENDOMETRIUM: ENDOMETRIOID TYPE POLYP(S), WHICH CONTAIN FOCI OF SIMILAR APPEARING MIXED SEROUS/ENDOMETRIOID ADENOCARCINOMA. - MYOMETRIUM: LEIOMYOMATA. - SEROSA: UNREMARKABLE. - BILATERAL ADNEXA: BENIGN OVARIES AND FALLOPIAN TUBES.   10/13/2016 Surgery   Surgery: Total robotic hysterectomy bilateral salpingo-oophorectomy, bilateral pelvic sentinel lymph node removal, left PA sentinel lymph node removal. Mini-laparotomy for specimen removal Surgeons:  Tammy Hensley. Alycia Rossetti, MD; Tammy Crocker, MD  Pathology:  1)Uterus, cervix, bilateral tubes and ovaries 2) Right obturator SLN 3) Left Obturator SLN 4) Left PA SLN Operative findings: 14 week fibroid uterus with one large dominant 6 cm myoma that was palpably calcified. 4 cm right ovarian cyst. + SLN identified in the bilateral obturator spaces, + SLN identified in the left PA space.    12/01/2016 - 05/31/2017 Chemotherapy   She received carboplatin & Taxol. She had 3 cycles of chemo  followed by radiation and then 3 more cycles of chemo   01/11/2017 Genetic Testing   Testing was normal and did not reveal Hensley mutation in these genes: The genes tested were the 80 genes on [facility]'s Multi-Cancer panel (ALK, APC, ATM, AXIN2, BAP1, BARD1, BLM, BMPR1A, BRCA1, BRCA2, BRIP1, CASR, CDC73, CDH1, CDK4, CDKN1B, CDKN1C, CDKN2A, CEBPA, CHEK2, DICER1, DIS3L2, EGFR, EPCAM, FH, FLCN, GATA2, GPC3, GREM1, HOXB13,  HRAS, KIT, MAX, MEN1, MET, MITF, MLH1, MSH2, MSH6, MUTYH, NBN, NF1, NF2, PALB2, PDGFRA, PHOX2B, PMS2, POLD1, POLE, POT1, PRKAR1A, PTCH1, PTEN, RAD50, RAD51C, RAD51D, RB1, RECQL4, RET, RUNX1, SDHA, SDHAF2, SDHB, SDHC, SDHD, SMAD4, SMARCA4, SMARCB1, SMARCE1, STK11, SUFU, TERC, TERT, TMEM127, TP53, TSC1, TSC2, VHL, WRN, and WT1).   02/10/2017 - 04/12/2017 Radiation Therapy   02/10/17-03/16/17; 03/29/17-04/12/17;  1) Pelvis/ 45 Gy in 25 fractions  2) Vaginal Cuff/ 18 Gy in 3 fractions   08/26/2018 Imaging   Large destructive mass lesion left iliac bone with large soft tissue mass extending into the posterior soft tissues and pelvis compatible with metastatic disease. Consider follow-up CT chest abdomen pelvis with contrast for further staging.  Radiation changes at L5 and the sacrum. No lumbar metastatic deposits  Multilevel degenerative changes above.   08/29/2018 Imaging   1. 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 2. New irregular nodular opacity in the medial right middle lobe, new since 2017 CT. Separate subcentimeter right upper lobe solid pulmonary nodule, for which no comparison exists. These findings are indeterminate for pulmonary metastases and attention on follow-up chest CT in 3 months is recommended. 3. No lymphadenopathy or additional findings of metastatic disease.No tumor recurrence at the hysterectomy margin. 4. Aortic Atherosclerosis (ICD10-I70.0). Additional chronic findings as detailed.   08/29/2018 Tumor Marker   Patient's  tumor was tested for the following markers: CA-125 Results of the tumor marker test revealed 17.4   09/06/2018 Procedure   CT-guided core biopsy performed of huge mass in the left iliac fossa destroying the left iliac bone.   09/06/2018 Pathology Results   Soft Tissue Needle Core Biopsy, Left Iliac Fossa - POORLY DIFFERENTIATED MALIGNANT NEOPLASM. Microscopic Comment The provided clinical history of both endometrial and breast cancer is noted. In the current case, immunohistochemistry for qualitative ER demonstrates scattered positive staining. CK8/18 is focally positive. CKAE1/3, CK7, CK20, TTF-1, CDX-2, GATA3 and PAX 8 are negative. The immunophenotype is not specific as to an origin for this poorly differentiated malignant neoplasm. The morphology is more suggestive of endometrial cancer than breast cancer, and may represent an unrecognized carcinosarcomatous component.   09/08/2018 - 09/27/2018 Radiation Therapy   She had palliative radiation treatment   09/29/2018 Procedure   Placement of single lumen port Hensley cath via right internal jugular vein. The catheter tip lies at the cavo-atrial junction. Hensley power injectable port Hensley cath was placed and is ready for immediate use.   10/04/2018 - 01/17/2019 Chemotherapy   The patient had carboplatin and taxol x 6 cycles   12/05/2018 Tumor Marker   Patient's tumor was tested for the following markers: CA125 Results of the tumor marker test revealed 5.5   12/05/2018 Imaging   1. Considerable cystic degeneration in the large destructive mass of the left iliac bone. New or progressive left iliac bone pathologic fracture extending into the left SI joint, along with abnormal subluxation at the pubic symphysis indicating pubic symphysis instability. 2. Previous pleural-based nodularity in the right middle lobe is markedly improved, currently 4 mm in thickness and previously 11 mm in thickness. 3. Indistinct new wedge-shaped lesion in the left mid kidney,  hypodense on portal venous phase images and hyperdense on delayed phase images, suggesting prolong tubular transit. This is Hensley nonspecific finding but could reflect local inflammation, infection, or less likely infiltrative tumor. 4. Other imaging findings of potential clinical significance: Aortic Atherosclerosis (ICD10-I70.0). Left main coronary artery atherosclerosis. 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   02/15/2019 Imaging   1. Slight progression of large expansile lytic lesion involving the left iliac bone. This demonstrates central low density, suggesting partial necrosis. This lesion was biopsied on 09/06/2018, revealing poorly differentiated malignant neoplasm. 2. New low-density lesion in the dome of the right hepatic lobe worrisome for cystic or treated metastasis. 3. No other evidence of metastatic disease. No explanation for the patient's symptoms. 4. 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    03/03/2019 - 04/07/2019 Anti-estrogen oral therapy   She cannot tolerate tamoxifen and megace   03/23/2019 Tumor Marker   Patient's tumor was tested for the following markers: CA125 Results of the tumor marker test revealed 3.8   04/13/2019 Imaging   1. 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 2. Mild decrease in size of large lytic bone metastasis and associated soft tissue mass involving the left ilium. 3. Unstable pathologic fracture of the left ilium, without significant change since prior exam. 4. New right lower lobe pulmonary metastases.     04/24/2019 -  Chemotherapy   The patient had DOCEtaxel (TAXOTERE) 130 mg in sodium chloride 0.9 % 250 mL chemo infusion, 75 mg/m2 = 130 mg, Intravenous,  Once, 4 of 4 cycles Dose modification: 50 mg/m2 (66.7 % of  original dose 75 mg/m2, Cycle 2, Reason: Dose Not Tolerated), 45  mg/m2 (60 % of original dose 75 mg/m2, Cycle 2, Reason: Dose Not Tolerated), 25 mg/m2 (33.3 % of original dose 75 mg/m2, Cycle 3, Reason: Dose Not Tolerated) Administration: 130 mg (04/24/2019), 80 mg (05/15/2019), 40 mg (06/05/2019), 40 mg (06/26/2019)  for chemotherapy treatment.    05/01/2019 - 05/03/2019 Hospital Admission   She was admitted to the hospital for evaluation of fever and weakness   05/22/2019 - 05/26/2019 Hospital Admission   She was admitted to the hospital with recurrent neutropenic fever with no source found   05/26/2019 Imaging   1. Enlarging large necrotic hepatic mass. 2. 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 3. New small right pleural effusion and bibasilar atelectasis   07/04/2019 Procedure   Successful ultrasound guided right thoracentesis yielding 350 mL of pleural fluid   07/04/2019 Pathology Results   PLEURAL FLUID, RIGHT (SPECIMEN 1 OF 1 COLLECTED 07-04-2019) REACTIVE MESOTHELIAL CELLS. NO MALIGNANT CELLS IDENTIFIED.   Metastasis to bone ([place])  04/24/2019 -  Chemotherapy   The patient had DOCEtaxel (TAXOTERE) 130 mg in sodium chloride 0.9 % 250 mL chemo infusion, 75 mg/m2 = 130 mg, Intravenous,  Once, 4 of 4 cycles Dose modification: 50 mg/m2 (66.7 % of original dose 75 mg/m2, Cycle 2, Reason: Dose Not Tolerated), 45 mg/m2 (60 % of original dose 75 mg/m2, Cycle 2, Reason: Dose Not Tolerated), 25 mg/m2 (33.3 % of original dose 75 mg/m2, Cycle 3, Reason: Dose Not Tolerated) Administration: 130 mg (04/24/2019), 80 mg (05/15/2019), 40 mg (06/05/2019), 40 mg (06/26/2019)  for chemotherapy treatment.    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([place])  02/16/2019 Initial Diagnosis   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([place])   04/24/2019 -  Chemotherapy   The patient had DOCEtaxel (TAXOTERE) 130 mg in sodium chloride 0.9 % 250 mL chemo infusion, 75 mg/m2 = 130 mg, Intravenous,  Once, 4 of 4 cycles Dose modification: 50  mg/m2 (66.7 % of original dose 75 mg/m2, Cycle 2, Reason: Dose Not Tolerated), 45 mg/m2 (60 % of original dose 75 mg/m2, Cycle 2, Reason: Dose Not Tolerated), 25 mg/m2 (33.3 % of original dose 75 mg/m2, Cycle 3, Reason: Dose Not Tolerated) Administration: 130 mg (04/24/2019), 80 mg (05/15/2019), 40 mg (06/05/2019), 40 mg (06/26/2019)  for chemotherapy treatment.      REVIEW OF SYSTEMS:   Constitutional: Denies fevers, chills or abnormal weight loss Eyes: Denies blurriness of vision Ears, nose, mouth, throat, and face: Denies mucositis or sore throat Respiratory: Denies cough, dyspnea or wheezes Cardiovascular: Denies palpitation, chest discomfort or lower extremity swelling Gastrointestinal:  Denies nausea, heartburn or change in bowel habits Skin: Denies abnormal skin rashes Lymphatics: Denies new lymphadenopathy or easy bruising Behavioral/Psych: Mood is stable, no new changes  All other systems were reviewed with the patient and are negative.  I have reviewed the past medical history, past surgical history, social history and family history with the patient and they are unchanged from previous Hensley.  ALLERGIES:  is allergic to omnipaque [iohexol]; calan [verapamil]; effexor [venlafaxine]; erythromycin; femara [letrozole]; fosamax [alendronate]; ibuprofen; paxil [paroxetine hcl]; remeron [mirtazapine]; and vasotec [enalapril].  MEDICATIONS:  Current Outpatient Medications  Medication Sig Dispense Refill  . Calcium Carbonate-Vitamin D (CALCIUM 600 + D PO) Take 1 tablet by mouth 2 (two) times daily.     . Cholecalciferol (VITAMIN D) 2000 units CAPS Take 2,000 Units by mouth every other  day.    . furosemide (LASIX) 20 MG tablet Take 1 tablet (20 mg total) by mouth daily as needed for fluid or edema. 30 tablet 1  . HYDROcodone-acetaminophen (NORCO/VICODIN) 5-325 MG tablet Take 1 tablet by mouth every 4 (four) hours as needed for severe pain. 60 tablet 0  . levothyroxine (SYNTHROID) 88 MCG tablet  Take 1 tablet daily on an empty stomach with only water for 30 minutes & no Antacid meds, Calcium or Magnesium for 4 hours & avoid Biotin (Patient taking differently: Take 88 mcg by mouth daily before breakfast. ) 90 tablet 3  . Multiple Vitamin (MULTIVITAMIN WITH MINERALS) TABS tablet Take 1 tablet by mouth daily.    . pantoprazole (PROTONIX) 40 MG tablet Take 1 tablet (40 mg total) by mouth 2 (two) times daily. 60 tablet 0  . polyethylene glycol (MIRALAX / GLYCOLAX) 17 g packet Take 17 g by mouth daily as needed for moderate constipation. 30 each 0  . pravastatin (PRAVACHOL) 40 MG tablet TAKE 1 TABLET BY MOUTH ONCE DAILY IN THE EVENING (Patient taking differently: Take 40 mg by mouth at bedtime. ) 90 tablet 0  . prochlorperazine (COMPAZINE) 10 MG tablet Take 1 tablet (10 mg total) by mouth every 6 (six) hours as needed for nausea or vomiting. 30 tablet 1  . traMADol (ULTRAM) 50 MG tablet Take 1 tablet (50 mg total) by mouth every 6 (six) hours as needed for moderate pain. 90 tablet 0  . traZODone (DESYREL) 100 MG tablet TAKE 1 TABLET BY MOUTH AT BEDTIME (Patient taking differently: Take 100 mg by mouth at bedtime. ) 30 tablet 0   No current facility-administered medications for this visit.    Facility-Administered Medications Ordered in Other Visits  Medication Dose Route Frequency Provider Last Rate Last Dose  . sodium chloride flush (NS) 0.9 % injection 10 mL  10 mL Intracatheter PRN Alvy Bimler, Joyia Riehle, MD   10 mL at 12/27/18 1825    PHYSICAL EXAMINATION: ECOG PERFORMANCE STATUS: 3 - Symptomatic, >50% confined to bed  Vitals:   07/10/19 1133  BP: 124/67  Pulse: 99  Resp: 16  Temp: 98 F (36.7 C)  SpO2: 100%   There were no vitals filed for this visit.  GENERAL:alert, no distress and comfortable Musculoskeletal:no cyanosis of digits and no clubbing  NEURO: alert & oriented x 3 with fluent speech, no focal motor/sensory deficits  LABORATORY DATA:  I have reviewed the data as listed     Component Value Date/Time   NA 135 07/03/2019 1105   NA 137 05/31/2017 0931   K 3.6 07/03/2019 1105   K 3.5 05/31/2017 0931   CL 96 (L) 07/03/2019 1105   CL 99 12/20/2012 1336   CO2 26 07/03/2019 1105   CO2 25 05/31/2017 0931   GLUCOSE 142 (H) 07/03/2019 1105   GLUCOSE 161 (H) 05/31/2017 0931   GLUCOSE 110 (H) 12/20/2012 1336   BUN 12 07/03/2019 1105   BUN 10.9 05/31/2017 0931   CREATININE 0.69 07/03/2019 1105   CREATININE 0.63 07/05/2018 1009   CREATININE 0.8 05/31/2017 0931   CALCIUM 9.4 07/03/2019 1105   CALCIUM 10.6 (H) 05/31/2017 0931   PROT 6.0 (L) 07/03/2019 1105   PROT 7.5 05/31/2017 0931   ALBUMIN 2.7 (L) 07/03/2019 1105   ALBUMIN 4.2 05/31/2017 0931   AST 48 (H) 07/03/2019 1105   AST 16 05/31/2017 0931   ALT 23 07/03/2019 1105   ALT 15 05/31/2017 0931   ALKPHOS 283 (H) 07/03/2019 1105  ALKPHOS 91 05/31/2017 0931   BILITOT 0.7 07/03/2019 1105   BILITOT 0.33 05/31/2017 0931   GFRNONAA >60 07/03/2019 1105   GFRNONAA 87 07/05/2018 1009   GFRAA >60 07/03/2019 1105   GFRAA 100 07/05/2018 1009    No results found for: SPEP, UPEP  Lab Results  Component Value Date   WBC 9.6 07/03/2019   NEUTROABS 8.7 (H) 07/03/2019   HGB 9.1 (L) 07/03/2019   HCT 28.6 (L) 07/03/2019   MCV 79.0 (L) 07/03/2019   PLT 532 (H) 07/03/2019      Chemistry      Component Value Date/Time   NA 135 07/03/2019 1105   NA 137 05/31/2017 0931   K 3.6 07/03/2019 1105   K 3.5 05/31/2017 0931   CL 96 (L) 07/03/2019 1105   CL 99 12/20/2012 1336   CO2 26 07/03/2019 1105   CO2 25 05/31/2017 0931   BUN 12 07/03/2019 1105   BUN 10.9 05/31/2017 0931   CREATININE 0.69 07/03/2019 1105   CREATININE 0.63 07/05/2018 1009   CREATININE 0.8 05/31/2017 0931      Component Value Date/Time   CALCIUM 9.4 07/03/2019 1105   CALCIUM 10.6 (H) 05/31/2017 0931   ALKPHOS 283 (H) 07/03/2019 1105   ALKPHOS 91 05/31/2017 0931   AST 48 (H) 07/03/2019 1105   AST 16 05/31/2017 0931   ALT 23 07/03/2019 1105    ALT 15 05/31/2017 0931   BILITOT 0.7 07/03/2019 1105   BILITOT 0.33 05/31/2017 0931       RADIOGRAPHIC STUDIES: I have personally reviewed the radiological images as listed and agreed with the findings in the report. Dg Chest 1 View  Result Date: 07/04/2019 CLINICAL DATA:  Pleural effusion.  Thoracentesis. EXAM: CHEST  1 VIEW COMPARISON:  07/03/2019.  06/30/2019.  05/22/2019.  CT 12/05/2018. FINDINGS: PowerPort catheter noted with tip in stable position. Heart size normal. Projected persistent right base atelectasis and right pleural effusion. No evidence of pneumothorax post thoracentesis. Persistent sclerotic density noted projected over the left anterior first rib. This is unchanged. IMPRESSION: No evidence of pneumothorax post thoracentesis. Electronically Signed   By: Marcello Moores  Register   On: 07/04/2019 11:10   Dg Chest 2 View  Result Date: 07/03/2019 CLINICAL DATA:  Endometrial cancer with pleural effusion EXAM: CHEST - 2 VIEW COMPARISON:  Three days ago Findings: Unchanged moderate right pleural effusion obscuring the right lower lobe. Normal heart size and mediastinal contours. Porta catheter with tip at the SVC. Rounded density over the left anterior first rib. IMPRESSION: 1. Moderate right pleural effusion without change from 3 days ago. 2. Rounded density over the left upper chest which could be pulmonary nodule or first rib lesion. Electronically Signed   By: Monte Fantasia M.D.   On: 07/03/2019 10:34   Dg Chest 2 View  Result Date: 06/30/2019 CLINICAL DATA:  Weakness EXAM: CHEST - 2 VIEW COMPARISON:  05/22/2019 FINDINGS: Right Port-Hensley-Cath remains in place, unchanged. Enlarging moderate to large right effusion. Right base atelectasis. Left lung clear. Heart is normal size. No acute bony abnormality. IMPRESSION: Moderate to large right pleural effusion, enlarging since prior study with right base atelectasis. Electronically Signed   By: Rolm Baptise M.D.   On: 06/30/2019 01:13   Ir  Thoracentesis Asp Pleural Space W/img Guide  Result Date: 07/04/2019 INDICATION: Patient with history of endometrial cancer with new finding of right pleural effusion and associated new onset dyspnea. Request to IR for diagnostic and therapeutic thoracentesis. EXAM: ULTRASOUND GUIDED RIGHT THORACENTESIS MEDICATIONS:  8 mL 1% lidocaine COMPLICATIONS: None immediate. PROCEDURE: An ultrasound guided thoracentesis was thoroughly discussed with the patient and questions answered. The benefits, risks, alternatives and complications were also discussed. The patient understands and wishes to proceed with the procedure. Written consent was obtained. Ultrasound was performed to localize and mark an adequate pocket of fluid in the right chest. The area was then prepped and draped in the normal sterile fashion. 1% Lidocaine was used for local anesthesia. Under ultrasound guidance Hensley 6 Fr Safe-T-Centesis catheter was introduced. Thoracentesis was performed. The catheter was removed and Hensley dressing applied. FINDINGS: Hensley total of approximately 350 mL of golden fluid was removed. Samples were sent to the laboratory as requested by the clinical team. IMPRESSION: Successful ultrasound guided right thoracentesis yielding 350 mL of pleural fluid. Read by Candiss Norse, PA-C Electronically Signed   By: Jacqulynn Cadet M.D.   On: 07/04/2019 10:58    All questions were answered. The patient knows to call the clinic with any problems, questions or concerns. No barriers to learning was detected.  I spent 25 minutes counseling the patient face to face. The total time spent in the appointment was 30 minutes and more than 50% was on counseling and review of test results  [name], MD 07/11/2019 8:59 AM

## 2019-07-11 NOTE — Assessment & Plan Note (Signed)
She has worsening right upper quadrant pain Tramadol is not helpful The patient has improved pain control with oxycodone given in the clinic today I recommend narcotic prescription to control her pain and she agreed to proceed I have prescribed and refilled her hydrocodone

## 2019-07-11 NOTE — Assessment & Plan Note (Signed)
I had significant goals of care discussion with the patient and her son The patient agreed to DNR I given her a DNR order We discussed referral to home-based palliative care/hospice and she agreed We discussed prognosis is likely less than 6 months

## 2019-07-24 ENCOUNTER — Encounter: Payer: Self-pay | Admitting: Internal Medicine

## 2019-07-25 DEATH — deceased

## 2020-04-09 ENCOUNTER — Ambulatory Visit: Payer: Medicare Other | Admitting: Adult Health Nurse Practitioner

## 2020-07-24 ENCOUNTER — Encounter: Payer: Medicare Other | Admitting: Internal Medicine

## 2024-08-10 NOTE — Progress Notes (Signed)
 Update: PMS2 c.13G>C reclassified as benign. Amended report date is 06/29/2024.
